# Patient Record
Sex: Female | Born: 1975 | Race: Asian | Hispanic: No | Marital: Married | Smoking: Never smoker
Health system: Southern US, Community
[De-identification: ages and names within clinical notes are randomized; demographics above are authoritative.]

## PROBLEM LIST (undated history)

## (undated) DIAGNOSIS — Z9981 Dependence on supplemental oxygen: Secondary | ICD-10-CM

## (undated) DIAGNOSIS — E119 Type 2 diabetes mellitus without complications: Secondary | ICD-10-CM

## (undated) DIAGNOSIS — J9600 Acute respiratory failure, unspecified whether with hypoxia or hypercapnia: Secondary | ICD-10-CM

## (undated) DIAGNOSIS — Z9483 Pancreas transplant status: Secondary | ICD-10-CM

## (undated) DIAGNOSIS — J189 Pneumonia, unspecified organism: Secondary | ICD-10-CM

## (undated) DIAGNOSIS — C569 Malignant neoplasm of unspecified ovary: Secondary | ICD-10-CM

## (undated) DIAGNOSIS — E11319 Type 2 diabetes mellitus with unspecified diabetic retinopathy without macular edema: Secondary | ICD-10-CM

## (undated) DIAGNOSIS — A150 Tuberculosis of lung: Secondary | ICD-10-CM

## (undated) DIAGNOSIS — Z9289 Personal history of other medical treatment: Secondary | ICD-10-CM

## (undated) DIAGNOSIS — Z992 Dependence on renal dialysis: Secondary | ICD-10-CM

## (undated) DIAGNOSIS — D649 Anemia, unspecified: Secondary | ICD-10-CM

## (undated) DIAGNOSIS — N186 End stage renal disease: Secondary | ICD-10-CM

## (undated) DIAGNOSIS — Z940 Kidney transplant status: Secondary | ICD-10-CM

## (undated) DIAGNOSIS — I10 Essential (primary) hypertension: Secondary | ICD-10-CM

## (undated) HISTORY — PX: EYE SURGERY: SHX253

## (undated) HISTORY — PX: RENAL BIOPSY: SHX156

---

## 2000-12-27 ENCOUNTER — Emergency Department (HOSPITAL_COMMUNITY): Admission: EM | Admit: 2000-12-27 | Discharge: 2000-12-27 | Payer: Self-pay | Admitting: Emergency Medicine

## 2003-10-08 DIAGNOSIS — C569 Malignant neoplasm of unspecified ovary: Secondary | ICD-10-CM

## 2003-10-08 HISTORY — DX: Malignant neoplasm of unspecified ovary: C56.9

## 2003-10-08 HISTORY — PX: TUMOR REMOVAL: SHX12

## 2004-07-26 ENCOUNTER — Emergency Department (HOSPITAL_COMMUNITY): Admission: EM | Admit: 2004-07-26 | Discharge: 2004-07-27 | Payer: Self-pay | Admitting: Emergency Medicine

## 2005-05-30 ENCOUNTER — Inpatient Hospital Stay (HOSPITAL_COMMUNITY): Admission: AD | Admit: 2005-05-30 | Discharge: 2005-05-30 | Payer: Self-pay | Admitting: Family Medicine

## 2005-11-01 ENCOUNTER — Ambulatory Visit: Payer: Self-pay | Admitting: *Deleted

## 2005-11-01 ENCOUNTER — Ambulatory Visit (HOSPITAL_COMMUNITY): Admission: RE | Admit: 2005-11-01 | Discharge: 2005-11-01 | Payer: Self-pay | Admitting: *Deleted

## 2005-11-06 ENCOUNTER — Ambulatory Visit: Payer: Self-pay | Admitting: *Deleted

## 2005-11-13 ENCOUNTER — Ambulatory Visit (HOSPITAL_COMMUNITY): Admission: RE | Admit: 2005-11-13 | Discharge: 2005-11-13 | Payer: Self-pay | Admitting: Obstetrics and Gynecology

## 2005-11-13 ENCOUNTER — Ambulatory Visit: Payer: Self-pay | Admitting: *Deleted

## 2005-11-21 ENCOUNTER — Ambulatory Visit: Payer: Self-pay | Admitting: Family Medicine

## 2005-11-27 ENCOUNTER — Ambulatory Visit: Payer: Self-pay | Admitting: Obstetrics & Gynecology

## 2005-11-27 ENCOUNTER — Ambulatory Visit: Admission: RE | Admit: 2005-11-27 | Discharge: 2005-11-27 | Payer: Self-pay | Admitting: Gynecologic Oncology

## 2005-12-04 ENCOUNTER — Ambulatory Visit: Payer: Self-pay | Admitting: *Deleted

## 2005-12-16 ENCOUNTER — Ambulatory Visit: Payer: Self-pay | Admitting: Family Medicine

## 2005-12-30 ENCOUNTER — Ambulatory Visit: Payer: Self-pay | Admitting: *Deleted

## 2006-01-06 ENCOUNTER — Ambulatory Visit: Payer: Self-pay | Admitting: Obstetrics & Gynecology

## 2006-01-14 ENCOUNTER — Ambulatory Visit: Payer: Self-pay | Admitting: *Deleted

## 2006-01-14 ENCOUNTER — Ambulatory Visit (HOSPITAL_COMMUNITY): Admission: RE | Admit: 2006-01-14 | Discharge: 2006-01-14 | Payer: Self-pay | Admitting: *Deleted

## 2006-01-20 ENCOUNTER — Ambulatory Visit: Payer: Self-pay | Admitting: Family Medicine

## 2006-01-21 ENCOUNTER — Ambulatory Visit (HOSPITAL_COMMUNITY): Admission: RE | Admit: 2006-01-21 | Discharge: 2006-01-21 | Payer: Self-pay | Admitting: *Deleted

## 2006-01-21 ENCOUNTER — Ambulatory Visit: Payer: Self-pay | Admitting: *Deleted

## 2006-01-28 ENCOUNTER — Ambulatory Visit (HOSPITAL_COMMUNITY): Admission: RE | Admit: 2006-01-28 | Discharge: 2006-01-28 | Payer: Self-pay | Admitting: *Deleted

## 2006-02-03 ENCOUNTER — Ambulatory Visit: Payer: Self-pay | Admitting: Family Medicine

## 2006-02-17 ENCOUNTER — Ambulatory Visit: Payer: Self-pay | Admitting: Family Medicine

## 2006-03-10 ENCOUNTER — Ambulatory Visit (HOSPITAL_COMMUNITY): Admission: RE | Admit: 2006-03-10 | Discharge: 2006-03-10 | Payer: Self-pay | Admitting: Obstetrics & Gynecology

## 2006-03-10 ENCOUNTER — Ambulatory Visit: Payer: Self-pay | Admitting: Obstetrics & Gynecology

## 2006-03-24 ENCOUNTER — Ambulatory Visit (HOSPITAL_COMMUNITY): Admission: RE | Admit: 2006-03-24 | Discharge: 2006-03-24 | Payer: Self-pay | Admitting: Obstetrics & Gynecology

## 2006-03-24 ENCOUNTER — Ambulatory Visit: Payer: Self-pay | Admitting: Obstetrics & Gynecology

## 2006-04-07 ENCOUNTER — Ambulatory Visit: Payer: Self-pay | Admitting: Gynecology

## 2006-04-08 ENCOUNTER — Ambulatory Visit: Payer: Self-pay | Admitting: Obstetrics and Gynecology

## 2006-04-08 ENCOUNTER — Inpatient Hospital Stay (HOSPITAL_COMMUNITY): Admission: AD | Admit: 2006-04-08 | Discharge: 2006-04-08 | Payer: Self-pay | Admitting: Obstetrics and Gynecology

## 2006-04-10 ENCOUNTER — Inpatient Hospital Stay (HOSPITAL_COMMUNITY): Admission: AD | Admit: 2006-04-10 | Discharge: 2006-04-22 | Payer: Self-pay | Admitting: Obstetrics & Gynecology

## 2006-04-10 ENCOUNTER — Ambulatory Visit: Payer: Self-pay | Admitting: Critical Care Medicine

## 2006-04-10 ENCOUNTER — Ambulatory Visit: Payer: Self-pay | Admitting: Family Medicine

## 2006-04-10 ENCOUNTER — Ambulatory Visit: Payer: Self-pay | Admitting: *Deleted

## 2006-04-11 ENCOUNTER — Ambulatory Visit: Payer: Self-pay | Admitting: Neonatology

## 2006-04-15 ENCOUNTER — Ambulatory Visit: Payer: Self-pay | Admitting: Infectious Diseases

## 2006-04-19 ENCOUNTER — Encounter: Payer: Self-pay | Admitting: Internal Medicine

## 2006-04-22 ENCOUNTER — Encounter: Payer: Self-pay | Admitting: Internal Medicine

## 2006-04-24 ENCOUNTER — Ambulatory Visit: Payer: Self-pay | Admitting: Family Medicine

## 2006-04-28 ENCOUNTER — Ambulatory Visit: Payer: Self-pay | Admitting: *Deleted

## 2006-04-28 ENCOUNTER — Ambulatory Visit: Payer: Self-pay | Admitting: Gynecology

## 2006-04-28 ENCOUNTER — Inpatient Hospital Stay (HOSPITAL_COMMUNITY): Admission: AD | Admit: 2006-04-28 | Discharge: 2006-05-25 | Payer: Self-pay | Admitting: Gynecology

## 2006-05-15 ENCOUNTER — Ambulatory Visit: Payer: Self-pay | Admitting: Infectious Diseases

## 2006-05-21 ENCOUNTER — Encounter (INDEPENDENT_AMBULATORY_CARE_PROVIDER_SITE_OTHER): Payer: Self-pay | Admitting: Specialist

## 2006-05-29 ENCOUNTER — Encounter: Payer: Self-pay | Admitting: Internal Medicine

## 2006-07-02 ENCOUNTER — Encounter: Payer: Self-pay | Admitting: Internal Medicine

## 2007-08-11 ENCOUNTER — Encounter (HOSPITAL_COMMUNITY): Admission: RE | Admit: 2007-08-11 | Discharge: 2007-11-09 | Payer: Self-pay | Admitting: Nephrology

## 2007-09-10 ENCOUNTER — Encounter: Admission: RE | Admit: 2007-09-10 | Discharge: 2007-12-09 | Payer: Self-pay | Admitting: Nephrology

## 2007-09-11 ENCOUNTER — Encounter: Admission: RE | Admit: 2007-09-11 | Discharge: 2007-09-11 | Payer: Self-pay | Admitting: Internal Medicine

## 2007-11-18 ENCOUNTER — Encounter (HOSPITAL_COMMUNITY): Admission: RE | Admit: 2007-11-18 | Discharge: 2008-02-16 | Payer: Self-pay | Admitting: Nephrology

## 2008-03-01 ENCOUNTER — Encounter (HOSPITAL_COMMUNITY): Admission: RE | Admit: 2008-03-01 | Discharge: 2008-05-16 | Payer: Self-pay | Admitting: Nephrology

## 2008-03-14 ENCOUNTER — Ambulatory Visit: Payer: Self-pay | Admitting: Cardiovascular Disease

## 2008-03-15 ENCOUNTER — Ambulatory Visit: Payer: Self-pay

## 2008-03-30 ENCOUNTER — Ambulatory Visit (HOSPITAL_COMMUNITY): Admission: RE | Admit: 2008-03-30 | Discharge: 2008-03-30 | Payer: Self-pay | Admitting: Surgery

## 2008-07-19 ENCOUNTER — Emergency Department (HOSPITAL_COMMUNITY): Admission: EM | Admit: 2008-07-19 | Discharge: 2008-07-19 | Payer: Self-pay | Admitting: Emergency Medicine

## 2008-07-21 ENCOUNTER — Encounter: Admission: RE | Admit: 2008-07-21 | Discharge: 2008-07-21 | Payer: Self-pay | Admitting: General Surgery

## 2008-07-26 ENCOUNTER — Ambulatory Visit (HOSPITAL_COMMUNITY): Admission: RE | Admit: 2008-07-26 | Discharge: 2008-07-26 | Payer: Self-pay | Admitting: Vascular Surgery

## 2008-07-26 ENCOUNTER — Ambulatory Visit: Payer: Self-pay | Admitting: Vascular Surgery

## 2008-07-26 ENCOUNTER — Emergency Department (HOSPITAL_COMMUNITY): Admission: EM | Admit: 2008-07-26 | Discharge: 2008-07-27 | Payer: Self-pay | Admitting: Emergency Medicine

## 2008-07-29 ENCOUNTER — Ambulatory Visit (HOSPITAL_COMMUNITY): Admission: RE | Admit: 2008-07-29 | Discharge: 2008-07-29 | Payer: Self-pay | Admitting: Surgery

## 2008-08-01 ENCOUNTER — Ambulatory Visit (HOSPITAL_COMMUNITY): Admission: RE | Admit: 2008-08-01 | Discharge: 2008-08-01 | Payer: Self-pay | Admitting: Nephrology

## 2008-08-05 ENCOUNTER — Ambulatory Visit (HOSPITAL_COMMUNITY): Admission: RE | Admit: 2008-08-05 | Discharge: 2008-08-05 | Payer: Self-pay | Admitting: Surgery

## 2008-08-10 ENCOUNTER — Encounter: Admission: RE | Admit: 2008-08-10 | Discharge: 2008-08-10 | Payer: Self-pay | Admitting: Nephrology

## 2008-08-12 ENCOUNTER — Telehealth (INDEPENDENT_AMBULATORY_CARE_PROVIDER_SITE_OTHER): Payer: Self-pay | Admitting: *Deleted

## 2008-09-12 ENCOUNTER — Ambulatory Visit: Payer: Self-pay | Admitting: Surgery

## 2008-09-22 ENCOUNTER — Ambulatory Visit: Payer: Self-pay | Admitting: Vascular Surgery

## 2008-09-22 ENCOUNTER — Ambulatory Visit (HOSPITAL_COMMUNITY): Admission: RE | Admit: 2008-09-22 | Discharge: 2008-09-22 | Payer: Self-pay | Admitting: Surgery

## 2008-10-07 DIAGNOSIS — J189 Pneumonia, unspecified organism: Secondary | ICD-10-CM

## 2008-10-07 HISTORY — DX: Pneumonia, unspecified organism: J18.9

## 2008-10-07 HISTORY — PX: COMBINED KIDNEY-PANCREAS TRANSPLANT: SHX1382

## 2008-10-10 ENCOUNTER — Ambulatory Visit: Payer: Self-pay | Admitting: Internal Medicine

## 2008-10-10 DIAGNOSIS — R0602 Shortness of breath: Secondary | ICD-10-CM

## 2008-10-10 DIAGNOSIS — E785 Hyperlipidemia, unspecified: Secondary | ICD-10-CM

## 2008-10-10 DIAGNOSIS — I10 Essential (primary) hypertension: Secondary | ICD-10-CM | POA: Insufficient documentation

## 2008-10-10 DIAGNOSIS — Z8611 Personal history of tuberculosis: Secondary | ICD-10-CM

## 2008-10-11 ENCOUNTER — Encounter: Admission: RE | Admit: 2008-10-11 | Discharge: 2008-10-11 | Payer: Self-pay | Admitting: Internal Medicine

## 2008-10-17 ENCOUNTER — Other Ambulatory Visit: Admission: RE | Admit: 2008-10-17 | Discharge: 2008-10-17 | Payer: Self-pay | Admitting: Gynecology

## 2008-10-17 ENCOUNTER — Encounter: Payer: Self-pay | Admitting: Gynecology

## 2008-10-17 ENCOUNTER — Ambulatory Visit: Payer: Self-pay | Admitting: Gynecology

## 2008-10-20 ENCOUNTER — Ambulatory Visit: Payer: Self-pay | Admitting: Cardiovascular Disease

## 2008-10-20 ENCOUNTER — Encounter (INDEPENDENT_AMBULATORY_CARE_PROVIDER_SITE_OTHER): Payer: Self-pay | Admitting: Nephrology

## 2008-10-20 ENCOUNTER — Ambulatory Visit: Payer: Self-pay

## 2008-10-20 ENCOUNTER — Encounter: Payer: Self-pay | Admitting: Cardiovascular Disease

## 2008-10-20 DIAGNOSIS — Z992 Dependence on renal dialysis: Secondary | ICD-10-CM

## 2008-10-20 DIAGNOSIS — N186 End stage renal disease: Secondary | ICD-10-CM | POA: Insufficient documentation

## 2008-10-20 DIAGNOSIS — R079 Chest pain, unspecified: Secondary | ICD-10-CM

## 2008-10-22 ENCOUNTER — Inpatient Hospital Stay (HOSPITAL_COMMUNITY): Admission: EM | Admit: 2008-10-22 | Discharge: 2008-10-24 | Payer: Self-pay | Admitting: Emergency Medicine

## 2008-10-23 ENCOUNTER — Ambulatory Visit: Payer: Self-pay | Admitting: Infectious Diseases

## 2008-11-08 ENCOUNTER — Ambulatory Visit: Payer: Self-pay | Admitting: Vascular Surgery

## 2008-11-14 ENCOUNTER — Ambulatory Visit: Payer: Self-pay | Admitting: Internal Medicine

## 2008-12-17 ENCOUNTER — Other Ambulatory Visit: Payer: Self-pay | Admitting: Emergency Medicine

## 2008-12-18 ENCOUNTER — Inpatient Hospital Stay (HOSPITAL_COMMUNITY): Admission: EM | Admit: 2008-12-18 | Discharge: 2008-12-21 | Payer: Self-pay | Admitting: Nephrology

## 2009-01-04 ENCOUNTER — Ambulatory Visit (HOSPITAL_COMMUNITY): Admission: RE | Admit: 2009-01-04 | Discharge: 2009-01-04 | Payer: Self-pay | Admitting: Nephrology

## 2009-01-14 ENCOUNTER — Emergency Department (HOSPITAL_COMMUNITY): Admission: EM | Admit: 2009-01-14 | Discharge: 2009-01-14 | Payer: Self-pay | Admitting: Emergency Medicine

## 2009-01-19 ENCOUNTER — Ambulatory Visit (HOSPITAL_COMMUNITY): Admission: RE | Admit: 2009-01-19 | Discharge: 2009-01-19 | Payer: Self-pay | Admitting: Nephrology

## 2009-02-01 ENCOUNTER — Ambulatory Visit: Payer: Self-pay | Admitting: Vascular Surgery

## 2009-02-07 ENCOUNTER — Ambulatory Visit (HOSPITAL_COMMUNITY): Admission: RE | Admit: 2009-02-07 | Discharge: 2009-02-07 | Payer: Self-pay | Admitting: Vascular Surgery

## 2009-02-08 ENCOUNTER — Ambulatory Visit: Payer: Self-pay | Admitting: Vascular Surgery

## 2009-02-16 ENCOUNTER — Ambulatory Visit (HOSPITAL_COMMUNITY): Admission: RE | Admit: 2009-02-16 | Discharge: 2009-02-16 | Payer: Self-pay | Admitting: Vascular Surgery

## 2009-02-16 ENCOUNTER — Ambulatory Visit: Payer: Self-pay | Admitting: Vascular Surgery

## 2009-03-28 ENCOUNTER — Ambulatory Visit (HOSPITAL_COMMUNITY): Admission: RE | Admit: 2009-03-28 | Discharge: 2009-03-28 | Payer: Self-pay | Admitting: Vascular Surgery

## 2009-03-28 ENCOUNTER — Ambulatory Visit: Payer: Self-pay | Admitting: Surgery

## 2009-07-27 ENCOUNTER — Ambulatory Visit: Payer: Self-pay | Admitting: Women's Health

## 2010-10-28 ENCOUNTER — Encounter: Payer: Self-pay | Admitting: Internal Medicine

## 2010-10-28 ENCOUNTER — Encounter: Payer: Self-pay | Admitting: *Deleted

## 2010-12-13 HISTORY — PX: PANCREATECTOMY: SHX1019

## 2011-01-15 LAB — POCT I-STAT 4, (NA,K, GLUC, HGB,HCT)
Glucose, Bld: 140 mg/dL — ABNORMAL HIGH (ref 70–99)
Potassium: 5.4 mEq/L — ABNORMAL HIGH (ref 3.5–5.1)

## 2011-01-16 LAB — CBC
HCT: 31.6 % — ABNORMAL LOW (ref 36.0–46.0)
MCHC: 32.7 g/dL (ref 30.0–36.0)
MCV: 83.2 fL (ref 78.0–100.0)
RBC: 3.8 MIL/uL — ABNORMAL LOW (ref 3.87–5.11)

## 2011-01-16 LAB — BASIC METABOLIC PANEL
BUN: 38 mg/dL — ABNORMAL HIGH (ref 6–23)
CO2: 27 mEq/L (ref 19–32)
Chloride: 92 mEq/L — ABNORMAL LOW (ref 96–112)
Creatinine, Ser: 8.05 mg/dL — ABNORMAL HIGH (ref 0.4–1.2)
Potassium: 5.6 mEq/L — ABNORMAL HIGH (ref 3.5–5.1)

## 2011-01-16 LAB — DIFFERENTIAL
Basophils Relative: 1 % (ref 0–1)
Eosinophils Absolute: 0.2 10*3/uL (ref 0.0–0.7)
Eosinophils Relative: 4 % (ref 0–5)
Monocytes Relative: 8 % (ref 3–12)
Neutrophils Relative %: 55 % (ref 43–77)

## 2011-01-17 LAB — BASIC METABOLIC PANEL
BUN: 47 mg/dL — ABNORMAL HIGH (ref 6–23)
CO2: 20 mEq/L (ref 19–32)
Calcium: 7.7 mg/dL — ABNORMAL LOW (ref 8.4–10.5)
Calcium: 9.1 mg/dL (ref 8.4–10.5)
Chloride: 96 mEq/L (ref 96–112)
Creatinine, Ser: 9.4 mg/dL — ABNORMAL HIGH (ref 0.4–1.2)
GFR calc Af Amer: 5 mL/min — ABNORMAL LOW (ref >60)
GFR calc Af Amer: 7 mL/min — ABNORMAL LOW (ref >60)
GFR calc Af Amer: 6 mL/min — ABNORMAL LOW (ref >60)
GFR calc non Af Amer: 6 mL/min — ABNORMAL LOW (ref >60)
GFR calc non Af Amer: 5 mL/min — ABNORMAL LOW (ref >60)
Glucose, Bld: 114 mg/dL — ABNORMAL HIGH (ref 70–99)
Potassium: 4.4 mEq/L (ref 3.5–5.1)
Potassium: 6.8 mEq/L (ref 3.5–5.1)
Sodium: 133 mEq/L — ABNORMAL LOW (ref 135–145)
Sodium: 135 mEq/L (ref 135–145)

## 2011-01-17 LAB — CBC
HCT: 35.2 % — ABNORMAL LOW (ref 36.0–46.0)
HCT: 42.5 % (ref 36.0–46.0)
Hemoglobin: 11.5 g/dL — ABNORMAL LOW (ref 12.0–15.0)
MCV: 77.4 fL — ABNORMAL LOW (ref 78.0–100.0)
MCV: 79.8 fL (ref 78.0–100.0)
Platelets: 169 10*3/uL (ref 150–400)
Platelets: 238 10*3/uL (ref 150–400)
RBC: 3.58 MIL/uL — ABNORMAL LOW (ref 3.87–5.11)
RBC: 4.42 MIL/uL (ref 3.87–5.11)
RBC: 5.32 MIL/uL — ABNORMAL HIGH (ref 3.87–5.11)
WBC: 6.4 10*3/uL (ref 4.0–10.5)
WBC: 13.1 10*3/uL — ABNORMAL HIGH (ref 4.0–10.5)
WBC: 18.9 10*3/uL — ABNORMAL HIGH (ref 4.0–10.5)

## 2011-01-17 LAB — GLUCOSE, CAPILLARY
Glucose-Capillary: 124 mg/dL — ABNORMAL HIGH (ref 70–99)
Glucose-Capillary: 135 mg/dL — ABNORMAL HIGH (ref 70–99)
Glucose-Capillary: 152 mg/dL — ABNORMAL HIGH (ref 70–99)
Glucose-Capillary: 223 mg/dL — ABNORMAL HIGH (ref 70–99)
Glucose-Capillary: 148 mg/dL — ABNORMAL HIGH (ref 70–99)
Glucose-Capillary: 178 mg/dL — ABNORMAL HIGH (ref 70–99)
Glucose-Capillary: 205 mg/dL — ABNORMAL HIGH (ref 70–99)
Glucose-Capillary: 232 mg/dL — ABNORMAL HIGH (ref 70–99)

## 2011-01-17 LAB — CULTURE, BLOOD (ROUTINE X 2): Culture: NO GROWTH

## 2011-01-17 LAB — DIFFERENTIAL
Lymphocytes Relative: 2 % — ABNORMAL LOW (ref 12–46)
Lymphs Abs: 0.4 10*3/uL — ABNORMAL LOW (ref 0.7–4.0)
Monocytes Relative: 4 % (ref 3–12)
Neutrophils Relative %: 93 % — ABNORMAL HIGH (ref 43–77)

## 2011-01-17 LAB — POTASSIUM
Potassium: 6.7 mEq/L (ref 3.5–5.1)
Potassium: 6.6 mEq/L (ref 3.5–5.1)

## 2011-01-17 LAB — RENAL FUNCTION PANEL
BUN: 60 mg/dL — ABNORMAL HIGH (ref 6–23)
Calcium: 8.6 mg/dL (ref 8.4–10.5)
Creatinine, Ser: 9.5 mg/dL — ABNORMAL HIGH (ref 0.4–1.2)
GFR calc Af Amer: 6 mL/min — ABNORMAL LOW (ref >60)
Glucose, Bld: 146 mg/dL — ABNORMAL HIGH (ref 70–99)
Phosphorus: 9.1 mg/dL — ABNORMAL HIGH (ref 2.3–4.6)
Sodium: 133 mEq/L — ABNORMAL LOW (ref 135–145)

## 2011-01-21 LAB — CBC
HCT: 34.7 % — ABNORMAL LOW (ref 36.0–46.0)
Hemoglobin: 11.1 g/dL — ABNORMAL LOW (ref 12.0–15.0)
Hemoglobin: 9.9 g/dL — ABNORMAL LOW (ref 12.0–15.0)
MCHC: 32 g/dL (ref 30.0–36.0)
MCHC: 33.6 g/dL (ref 30.0–36.0)
MCV: 87.4 fL (ref 78.0–100.0)
MCV: 87.7 fL (ref 78.0–100.0)
Platelets: 197 10*3/uL (ref 150–400)
Platelets: 256 10*3/uL (ref 150–400)
RBC: 3.97 MIL/uL (ref 3.87–5.11)
RBC: 4.36 MIL/uL (ref 3.87–5.11)
RDW: 20.6 % — ABNORMAL HIGH (ref 11.5–15.5)
RDW: 20.7 % — ABNORMAL HIGH (ref 11.5–15.5)
WBC: 19.4 10*3/uL — ABNORMAL HIGH (ref 4.0–10.5)
WBC: 27.5 10*3/uL — ABNORMAL HIGH (ref 4.0–10.5)

## 2011-01-21 LAB — DIFFERENTIAL
Basophils Absolute: 0 10*3/uL (ref 0.0–0.1)
Basophils Absolute: 0 10*3/uL (ref 0.0–0.1)
Basophils Relative: 0 % (ref 0–1)
Basophils Relative: 0 % (ref 0–1)
Eosinophils Absolute: 0 10*3/uL (ref 0.0–0.7)
Eosinophils Relative: 0 % (ref 0–5)
Lymphocytes Relative: 10 % — ABNORMAL LOW (ref 12–46)
Lymphocytes Relative: 3 % — ABNORMAL LOW (ref 12–46)
Lymphs Abs: 0.8 10*3/uL (ref 0.7–4.0)
Monocytes Absolute: 0.6 10*3/uL (ref 0.1–1.0)
Monocytes Absolute: 1.1 10*3/uL — ABNORMAL HIGH (ref 0.1–1.0)
Monocytes Relative: 4 % (ref 3–12)
Neutro Abs: 25.6 10*3/uL — ABNORMAL HIGH (ref 1.7–7.7)
Neutro Abs: 8.4 10*3/uL — ABNORMAL HIGH (ref 1.7–7.7)
Neutrophils Relative %: 83 % — ABNORMAL HIGH (ref 43–77)
Neutrophils Relative %: 93 % — ABNORMAL HIGH (ref 43–77)
WBC Morphology: INCREASED

## 2011-01-21 LAB — RENAL FUNCTION PANEL
CO2: 28 mEq/L (ref 19–32)
Chloride: 95 mEq/L — ABNORMAL LOW (ref 96–112)
GFR calc Af Amer: 11 mL/min — ABNORMAL LOW (ref >60)
GFR calc non Af Amer: 9 mL/min — ABNORMAL LOW (ref >60)
Potassium: 5.2 mEq/L — ABNORMAL HIGH (ref 3.5–5.1)
Sodium: 133 mEq/L — ABNORMAL LOW (ref 135–145)

## 2011-01-21 LAB — COMPREHENSIVE METABOLIC PANEL
ALT: 12 U/L (ref 0–35)
ALT: 19 U/L (ref 0–35)
AST: 18 U/L (ref 0–37)
AST: 21 U/L (ref 0–37)
Albumin: 3 g/dL — ABNORMAL LOW (ref 3.5–5.2)
Alkaline Phosphatase: 47 U/L (ref 39–117)
BUN: 28 mg/dL — ABNORMAL HIGH (ref 6–23)
CO2: 20 mEq/L (ref 19–32)
CO2: 27 mEq/L (ref 19–32)
Calcium: 7.8 mg/dL — ABNORMAL LOW (ref 8.4–10.5)
Calcium: 7.9 mg/dL — ABNORMAL LOW (ref 8.4–10.5)
Chloride: 90 mEq/L — ABNORMAL LOW (ref 96–112)
Chloride: 95 mEq/L — ABNORMAL LOW (ref 96–112)
Creatinine, Ser: 8.39 mg/dL — ABNORMAL HIGH (ref 0.4–1.2)
GFR calc Af Amer: 7 mL/min — ABNORMAL LOW (ref >60)
GFR calc non Af Amer: 5 mL/min — ABNORMAL LOW (ref >60)
GFR calc non Af Amer: 6 mL/min — ABNORMAL LOW (ref >60)
Glucose, Bld: 145 mg/dL — ABNORMAL HIGH (ref 70–99)
Glucose, Bld: 204 mg/dL — ABNORMAL HIGH (ref 70–99)
Potassium: 5.2 mEq/L — ABNORMAL HIGH (ref 3.5–5.1)
Sodium: 127 mEq/L — ABNORMAL LOW (ref 135–145)
Sodium: 127 mEq/L — ABNORMAL LOW (ref 135–145)
Total Bilirubin: 0.8 mg/dL (ref 0.3–1.2)
Total Bilirubin: 1 mg/dL (ref 0.3–1.2)
Total Protein: 6.2 g/dL (ref 6.0–8.3)

## 2011-01-21 LAB — GLUCOSE, CAPILLARY
Glucose-Capillary: 131 mg/dL — ABNORMAL HIGH (ref 70–99)
Glucose-Capillary: 131 mg/dL — ABNORMAL HIGH (ref 70–99)
Glucose-Capillary: 141 mg/dL — ABNORMAL HIGH (ref 70–99)
Glucose-Capillary: 145 mg/dL — ABNORMAL HIGH (ref 70–99)
Glucose-Capillary: 163 mg/dL — ABNORMAL HIGH (ref 70–99)
Glucose-Capillary: 164 mg/dL — ABNORMAL HIGH (ref 70–99)
Glucose-Capillary: 168 mg/dL — ABNORMAL HIGH (ref 70–99)
Glucose-Capillary: 208 mg/dL — ABNORMAL HIGH (ref 70–99)

## 2011-01-21 LAB — AFB CULTURE WITH SMEAR (NOT AT ARMC): Acid Fast Smear: 3

## 2011-01-21 LAB — CULTURE, BLOOD (ROUTINE X 2)

## 2011-01-21 LAB — POCT I-STAT 3, ART BLOOD GAS (G3+)
O2 Saturation: 94 %
Patient temperature: 100.2
TCO2: 25 mmol/L (ref 0–100)
pO2, Arterial: 70 mmHg — ABNORMAL LOW (ref 80.0–100.0)

## 2011-01-21 LAB — EXPECTORATED SPUTUM ASSESSMENT W GRAM STAIN, RFLX TO RESP C

## 2011-01-21 LAB — PHOSPHORUS: Phosphorus: 4.9 mg/dL — ABNORMAL HIGH (ref 2.3–4.6)

## 2011-01-21 LAB — IRON AND TIBC: TIBC: 249 ug/dL — ABNORMAL LOW (ref 250–470)

## 2011-02-19 ENCOUNTER — Other Ambulatory Visit: Payer: Self-pay | Admitting: Nephrology

## 2011-02-19 ENCOUNTER — Encounter (HOSPITAL_COMMUNITY): Payer: BC Managed Care – PPO | Attending: Nephrology

## 2011-02-19 DIAGNOSIS — N183 Chronic kidney disease, stage 3 unspecified: Secondary | ICD-10-CM | POA: Insufficient documentation

## 2011-02-19 DIAGNOSIS — D638 Anemia in other chronic diseases classified elsewhere: Secondary | ICD-10-CM | POA: Insufficient documentation

## 2011-02-19 NOTE — H&P (Signed)
[name], [name] NO.:  [id number]   MEDICAL RECORD NO.:  [id number]          PATIENT TYPE:  INP   LOCATION:  6740                         FACILITY:  [facility]   PHYSICIAN:  Maree Krabbe, M.D.DATE OF BIRTH:  September 10, 1976   DATE OF ADMISSION:  12/18/2008  DATE OF DISCHARGE:                              HISTORY & PHYSICAL   REASON FOR ADMISSION:  Fever.   HISTORY:  This is a 35 year old Asian female with end-stage renal  disease due to diabetes.  She has had a 12-year history of diabetes.  She was initially treated with peritoneal dialysis which failed after 2  months, I believe due to infection according to the patient's history.  She was changed to hemodialysis 7 months ago via a PermCath, and she has  a left forearm AV fistula which is not mature yet.  The patient says the  fistula needs another month or so to be used.   The patient presented to [facility] last night with high  fevers and chills of 24 hours duration.  She had no other focal symptoms  and specifically denied any cough, chest pain, headache, abdominal pain.  She says that she does not void anymore.  She did have some nausea with  vomiting in the emergency room.  Blood cultures were done, and the  patient was given vancomycin and Fortaz empirically.  Potassium returned  to high at 6.8, and the patient had a white blood count of 18,000, so  she was transferred for admission to [facility].  Temperature  which was 100 on presentation increased up to 101.5.   PAST MEDICAL HISTORY:  ESRD, diabetes, hypertension on 3 medications,  history of Tenckhoff catheter placement and removal, right IJ Diatek,  left forearm AV fistula.   MEDICATIONS:  1. Micardis 80 daily.  2. Aspirin 81 mg daily.  3. Clonidine 0.2 b.i.d.  4. Simvastatin 20 daily.  5. Amlodipine 10 mg at bedtime.  6. Lexapro 10 mg daily.  7. Levemir FlexPen.  8. Rena-Vite.   No allergies.   SOCIAL HISTORY:   Marries.  She has children.  She is not working.  No  alcohol, tobacco, or drug use.   REVIEW OF SYSTEMS:  Denies weight loss, hearing loss, visual change,  headache.  Denies productive cough, sore throat, difficulty swallowing,  chest pain, abdominal pain, diarrhea.  She does not void.  She denies  any joint pain or swelling.  Denies any skin rash or ulceration.  No  focal neurologic complaints.   PHYSICAL EXAMINATION:  VITAL SIGNS:  Blood pressure is 139/78, pulse 96,  temp 102.  GENERAL:  The patient is alert, looks uncomfortable but not in any  distress.  SKIN:  Warm and dry.  HEENT:  PERRLA.  EOMI.  Sclerae anicteric.  Throat is clear.  NECK:  Supple with flat neck veins.  No meningismus.  CHEST:  Clear throughout.  CARDIAC:  Regular rate and rhythm without murmur, rub, or gallop.  She  is tachycardic.  ABDOMEN:  Soft, flat, nontender, with active bowel sounds.  EXTREMITIES:  Joints show no signs of effusion or tenderness.  The lower  extremities show no edema.  Good pulses in the feet.  No skin rash or  ulceration.  NEUROLOGIC:  Alert and oriented x3, nonfocal motor exam.   LABORATORY DATA:  Sodium 136.  Potassium  at the outside facility was  6.8.  She was treated there acutely with Kayexalate, IV insulin,  glucose, and calcium.  Repeat potassium here is 5.8, BUN 47, creatinine  9.4.  Hemoglobin 13, white blood count 18,000.  I cannot find chest x-  ray result.   IMPRESSION:  1. Hyperkalemia.  2. Fever and leukocytosis.  Suspect catheter-related sepsis.  No focal      findings on exam.  Chest x-ray needs to be done.  The patient does      not void, so will not attempt to collect a urine specimen at this      time.  3. End-stage renal disease, dialyzes Monday, Wednesday, Friday.      History of failed peritoneal dialysis.  4. History of diabetes on insulin.  5. Left forearm arteriovenous fistula which is immature.  6. Hypertension on 3 blood pressure medications.    PLAN:  1. Admit.  2. Acute dialysis for hyperkalemia.  3. Blood cultures and IV antibiotics.  4. Dialysis Monday, Wednesday, Friday.  5. We will keep catheter in for 24-48 hours to see what kind of      clinical response he has.  May need to be removed if the patient's      symptoms are worsening or not getting better over time.       Maree Krabbe, M.D.  Electronically Signed     RDS/MEDQ  D:  12/18/2008  T:  12/18/2008  Job:  [id number]

## 2011-02-19 NOTE — Discharge Summary (Signed)
[name], [name]                   ACCOUNT NO.:  [id number]   MEDICAL RECORD NO.:  [id number]          PATIENT TYPE:  INP   LOCATION:  6740                         FACILITY:  [facility]   PHYSICIAN:  Dr. Darrick Penna          DATE OF BIRTH:  January 28, 1976   DATE OF ADMISSION:  12/18/2008  DATE OF DISCHARGE:  12/21/2008                               DISCHARGE SUMMARY   ATTENDING PHYSICIAN:  Dr. Darrick Penna.   DISCHARGE DIAGNOSES:  1. Fever and leukocytosis secondary to catheter-related sepsis with      methicillin-sensitive Staphylococcus aureus, resolving.  2. Hyperkalemia, resolved.  3. End-stage renal disease, requiring Monday, Wednesday, and Friday      hemodialysis.  4. Diabetes mellitus.  5. Hypertension.  6. Hyperlipidemia.  7. Gastroesophageal reflux disease.  8. Depression.   DISCHARGE MEDICATIONS:  1. Amlodipine 10 mg p.o. nightly.  2. Micardis 80 mg p.o. nightly.  3. Aspirin 81 mg p.o. daily.  4. Simvastatin 20 mg p.o. daily.  5. Lexapro 10 mg p.o. daily.  6. Rena-Vite 1 tab p.o. daily.  7. Levemir FlexPen 27 units subcu nightly.  8. PhosLo 667 mg p.o. t.i.d.  9. Aciphex 20 mg p.o. daily.  10.Epogen 23,800 units q. hemodialysis 3 times per week.  11.Ancef 1 g q. hemodialysis for 3 weeks post discharge.   DISPOSITION AND FOLLOWUP:  The patient was discharged home in stable  condition.  She was afebrile for greater than 24 hours and her regular  Monday, Wednesday, and Friday hemodialysis schedule will be maintained  upon discharge.  The patient's next dialysis is scheduled for December 23, 2008, at the [facility] location.  She will need to continue on her  Ancef for a total of 3 weeks to complete treatment of her catheter-  related methicillin-sensitive Staph aureus bacteremia.  During her next  hemodialysis session, the patient should also be assessed for further  reduction of her leukocytosis as well as any recurrence of hyperkalemia.   PROCEDURES PERFORMED:  None.   CONSULTATIONS:  None.   BRIEF ADMITTING HISTORY AND PHYSICAL:  Ms. Wissner is a 35 year old female  with end-stage renal disease secondary to diabetes.  She was initially  treated with peritoneal dialysis which failed after 2 months.  She was  changed to hemodialysis 7 months ago with a PermCath and also has a left  forearm AV fistula that was placed approximately 2-3 months ago.  The  patient presented to the [facility] Emergency Room the night prior to  admission with high fevers and chills for 24 hours' duration.  She had  no other focal symptoms and specifically denied any cough, chest pain,  headache, or abdominal pain.  She says that she does not void any more.  She did have some emesis with nausea while in the emergency room.  Blood  cultures were performed and the patient was given vancomycin and Fortaz  empirically.  Potassium levels were found to be elevated at 6.8 and the  patient at that time also had elevated white blood cell count of 18,000.  Therefore, she was transferred to the [facility] for admission  on to the Renal Service.  The patient was also noted while she was in  the [facility] Emergency Department to have a temperature of 101.5  degrees.   Vitals and physical exam on admission are as follows:  VITAL SIGNS:  Blood pressure 139/78, heart rate 96, and temperature  102.0 degrees.  GENERAL:  The patient is alert, appears in mild discomfort but is not in  acute distress.  HEENT:  Pupils are equal, round, and reactive to light, extraocular  motions intact, anicteric sclerae, OP/OC clear.  NECK:  Supple neck with flat veins, no meningeal signs.  CARDIOVASCULAR:  Regular rate and rhythm without murmurs, rubs, or  gallops.  RESPIRATORY:  Clear to auscultation bilaterally.  ABDOMEN:  Positive bowel sounds with a soft, nondistended, and nontender  abdomen.  EXTREMITIES:  No signs of effusion or tenderness in the joints.  There  is no lower extremity edema.  A 2+  dorsalis pedis pulses bilaterally.  A  left forearm AV fistula with thrill was also present.  NEUROLOGIC:  The patient is alert and oriented x3.  Cranial nerves II-  XII are intact and otherwise nonfocal.   LABORATORY DATA ON ADMISSION:  Sodium of 136, potassium of 6.8, chloride  96, bicarb 22, glucose 145, BUN 47, creatinine 9.4, and calcium 9.1.  White blood cell count was 18.9, hemoglobin 13.6, MCV 79.8, and  platelets 238.   HOSPITAL COURSE:  1. Hyperkalemia:  Potassium at an outside facility was noted to be 6.8      and she was treated there acutely with Kayexalate, IV insulin,      glucose, and calcium.  Repeat potassium here was 5.8.  The patient      had acute hemodialysis performed on the night of admission and this      further decreased her potassium levels to within normal limits.      Throughout the rest of her hospital course, the patient had no      further episodes of hyperkalemia.  2. Fever and leukocytosis:  On admission, the patient had a white      blood cell count of 18,000 and had a temperature of 102 degrees.      Blood cultures were immediately obtained which showed grew      methicillin-sensitive Staph aureus, most likely related to her      catheter.  She was empirically started on IV vancomycin and Fortaz      while final blood cultures and sensitivities returned.  However,      once these were back, the patient was transitioned to IV Ancef      therapy for her MSSA sepsis.  Other sources of infection were      evaluated for, but none found.  Throughout her hospital course, the      patient remained with stable vital signs and by the day of      discharge she had been afebrile for greater than 24 hours.  Her      leukocytosis showed a decreasing trend as well.  The patient will      need to continue with her Ancef 1 g q. hemodialysis 3 times a week      for a total of 3 weeks due to the slow resolution of her catheter-      related sepsis.  3. End-stage  renal disease:  The patient was maintained on her Monday,  Wednesday, Friday hemodialysis and this will be continued as      scheduled upon discharge.  4. Diabetes mellitus:  The patient was placed on a carb-modified renal      diet upon admission and started on sliding scale insulin.  Her CBGs      remained fairly well controlled throughout her hospitalization and      upon discharge she was transitioned back to her usual home regimen      of Levemir.  5. Hypertension:  The patient was on 3 different blood pressure      medications prior to admission.  However, her medications were held      throughout her hospital course and restarted slowly once her acute      illness had stabilized.  It was determined that the patient did not      need her clonidine as her blood pressures were well controlled with      use of just her home amlodipine and Micardis with careful attention      to appropriate volume removal during her hemodialysis sessions.      During her followup visit, her blood pressure should be rechecked      and if patient has not been found to be volume overloaded,      consideration can be given to restarting her clonidine at her prior      home dose.  However, it was felt that with appropriate volume      removal during her hemodialysis sessions the patient would not need      to have this restarted.  6. Hyperlipidemia:  The patient was continued on her simvastatin with      no changes made to her dosage.  Consider checking a fasting lipid      profile at her next office visit followup appointment.  7. Access:  The patient had a left forearm AV fistula that was placed      approximately 2-3 months prior to admission.  Upon admission, the      patient stated that her fistula was not mature enough to be used      and was told that it needed another month or so in order to be      fully mature.  However, because of her catheter-related sepsis, her      PermCath had to be  removed and therefore we started using her left      forearm AV fistula for her dialysis.  The patient tolerated this      without any major complications and she will continue using her      fistula from now onwards for future hemodialysis sessions.      Lucy Antigua, MD  Electronically Signed     ______________________________  Dr. Darrick Penna    RK/MEDQ  D:  12/21/2008  T:  12/21/2008  Job:  [id number]   cc:   [facility]

## 2011-02-19 NOTE — Assessment & Plan Note (Signed)
OFFICE VISIT   [name], [name]  DOB:  September 12, 1976                                       02/08/2009  HYQMV#:[id number]   The patient returns for followup today.  She had a fistulogram done this  past week for a nonmaturing left radiocephalic AV fistula.   Review of the film shows that the fistula is small in caliber throughout  its course.  There are no competing side branches.  There is no  anastomotic stenosis.   PHYSICAL EXAMINATION:  On physical exam today there is an audible bruit  in the fistula.  However, it is small in character and I do not believe  it is going to be large enough to cannulate.  One previous attempt at  cannulation was done and this was unsuccessful.   I believe the best option for the patient would be ligation of her left  Cimino fistula and placement of a left forearm AV graft.  Risks,  benefits, possible complications and procedure details were explained to  the patient today.  She understands and agrees to proceed.  We have  scheduled her graft placement for May 13 with fistula ligation at the  same time.   Janetta Hora. Fields, MD  Electronically Signed   CEF/MEDQ  D:  02/08/2009  T:  02/09/2009  Job:  [id number]   cc:   Garnetta Buddy, M.D.

## 2011-02-19 NOTE — Consult Note (Signed)
NAMELOANY, NEUROTH NO.:  [id number]   MEDICAL RECORD NO.:  [id number]          PATIENT TYPE:  INP   LOCATION:  2110                         FACILITY:  [facility]   PHYSICIAN:  Fayrene Fearing L. Deterding, M.D.DATE OF BIRTH:  Jun 28, 1976   DATE OF CONSULTATION:  DATE OF DISCHARGE:  09/22/2008                                 CONSULTATION   REASON FOR CONSULTATION:  1. End-stage renal disease.  2. Hypertension.  3. Hypoparathyroidism.  4. Anemia.   HISTORY OF PRESENT ILLNESS:  This is a 35 year old female with end-stage  renal disease and diabetes who also has had hypertension.  She was on  peritoneal dialysis for a number of months, up until November when she  had recurrent infections and ultrafiltration failure and went on  hemodialysis.  She had a fistula placed in her right arm on December 7,  but currently dialyses via a Civil engineer, contracting IJ.  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   MEDICATIONS AT HOME:  1. Micardis 20 mg a day.  2. Aciphex 20 mg a day.  3. Clonidine 0.1 mg.  4. Sorbitol p.r.n.  5. Amlodipine 10 mg at night.  6. Levemir 18 units a day.  7. Promethazine 12.5 mg t.i.d. p.r.n.  8. Rena-Vite once a day.  9. She gets EPO at the dialysis unit, 28,000 units each treatment.  10.Hectorol 3 mcg a day.  11.Venofer 100 mg three times a week.  12.__________ mg IV once a month.   PAST MEDICAL HISTORY:  As listed above.   ALLERGIES:  ACE-INHIBITORS.   REVIEW OF SYSTEMS:  HEENT:  She has had no headaches.  She has had  visual difficulties.  She has had a laser surgery in the lab about a  month ago for her diabetic heart disease.  She has no sore throat, dry  eyes, and mouth.  PULMONARY:  She has had no cough, sputum production  but notes she is more short of breath right now after they gave her  fluid in emergency room.  Baseline sleeps on one  pillow and  denies  ankle edema.  GI:  She has had about 4-6 episodes of  nausea, vomiting a  day and 3 to 4 loose stools.   She has had no cough or sputum production.  She has had no skin rash or  arthralgias or neurologic symptoms.   FAMILY HISTORY:  Remarkable  for diabetes in mother.  Father died of  lung cancer.   SOCIAL HISTORY:  She is disabled, has a 15-year-old child.  She is  married.   OBJECTIVE/PHYSICAL EXAMINATION:  GENERAL:  Young female no acute  distress.  VITAL SIGNS:  Temp 102, blood pressure 98/66 on arrival.  She was given  a liter of normal saline  and her sats dropped to 88% here in emergency  room.  She is now 96% on 35% Ventimask.  HEENT:  Diabetic retinopathy status post laser therapy.  NECK:  Without masses or thyromegaly.  CARDIOVASCULAR:  Regular rhythm.  Grade 1-2/6 holosystolic murmur heard  best at the apex.  PMI is 10 cm out from the left midclavicular line in  the fifth intercostal space.  She has 1+ edema.  No bruits are noted.  She does have a fistula in her left forearm which is functional, but not  matured.  LUNGS:  Revealed few crackles at bases.  Decreased breath sounds.  ABDOMEN:  Active bowel sounds.  Soft and nontender.  No organomegaly.  She has a right IJ [employer].  SKIN:  Without rashes.  MUSCULOSKELETAL:  Unremarkable.  NEUROLOGIC:  Cranial nerves II through XII grossly intact.  Motor is 5/5  and symmetric.  Deep tendon reflexes are 1+/4+.   LABORATORY DATA:  Chest x-ray, interstitial edema and  cardiomegaly.  Sodium 127, potassium 5.2, chloride 95, bicarbonate 20, creatinine 0.84,  BUN 28, glucose 145, and albumin 3.  Hemoglobin 11.1, white count is  27.5, platelets 256,000.   ASSESSMENT:  1. Chronic renal failure.  We will need hemodialysis to decrease her      volume __________ scheduled on Monday, Wednesday, and Friday.  We      will try to get her formal orders, these orders that we have are      from the preceding progress  notes.  2. Persistent fevers.  We will request with cath infection versus some      other infection specially could be a viral, nausea, vomiting.      Needed culture and give her antibiotics.  If she has positive      cultures consider  discontinuing the catheter.  3. Diabetes mellitus needs tight control.  4. Hypertension.  Decrease volume and continue her medications and try      to limit and/or cut those back.  5. Anemia.  Continue EPO and iron.  6. Secondary hypoparathyroidism.  7. History of tuberculosis, rule out reactivation.   PLAN:  As above:  1. Hemodialysis.  2. Diabetes control.  3. Cultures.  4. Antibiotics.           ______________________________  Llana Aliment Deterding, M.D.     JLD/MEDQ  D:  10/23/2008  T:  10/23/2008  Job:  [id number]

## 2011-02-19 NOTE — Assessment & Plan Note (Signed)
OFFICE VISIT   [name], [name]  DOB:  17-Sep-1976                                       09/12/2008  CHART#:[id number]   REASON FOR VISIT:  Evaluate for new access.   HISTORY:  This is a 35 year old female I am seeing for evaluation of  permanent access for dialysis.  The patient has a history of  preeclampsia and diabetes.  She has been on dialysis for 4 months and  has been using a right-sided catheter.  She is right-handed.   REVIEW OF SYSTEMS:  GENERAL:  Negative for fevers, chills, weight gain,  weight loss.  CARDIAC:  Negative.  PULMONARY:  Negative.  GI:  Negative.  GU:  As above.  VASCULAR:  Negative.  NEURO:  Negative.  ORTHO:  Negative.  PSYCH:  Negative.  ENT:  Negative.  HEME:  Negative.   SOCIAL HISTORY:  She is married with 1 child.  Does not smoke.  Does not  drink.   FAMILY HISTORY:  Noncontributory.   PAST MEDICAL HISTORY:  1. Diabetes.  2. Hypertension.  3. Hypercholesterolemia.  4. Preeclampsia.   MEDICATIONS:  Amlodipine, Levemir FlexPen, Rena-Vite, simvastatin,  aspirin, PhosLo.   ALLERGIES:  ACE inhibitors.   PHYSICAL EXAMINATION:  Blood pressure 153/92, pulse 83. General:  Well-  appearing, no distress.  Cardiovascular:  Regular rate and rhythm.  Pulmonary:  Lungs are clear bilaterally, left arm has a palpable radial  pulse.   Diagnostic studies:  Vein mapping was performed, she has adequate  cephalic vein for a wrist fistula.   ASSESSMENT/PLAN:  End-stage disease needing permanent access.   PLAN:  The patient will be scheduled for a left wrist fistula.  This  will be done on Thursday, December 17th.  The risks and benefits were  discussed with the patient including risk of steal and risk of non-  maturity.  She understands all of these and is ready to proceed.   Jorge Ny, MD  Electronically Signed   VWB/MEDQ  D:  09/12/2008  T:  09/13/2008  Job:  [id number]   cc:   Garnetta Buddy, M.D.

## 2011-02-19 NOTE — Op Note (Signed)
[name], [name]                   ACCOUNT NO.:  [id number]   MEDICAL RECORD NO.:  [id number]          PATIENT TYPE:  AMB   LOCATION:  SDS                          FACILITY:  [facility]   PHYSICIAN:  Charles E. Fields, MD  DATE OF BIRTH:  01-Jun-1976   DATE OF PROCEDURE:  07/26/2008  DATE OF DISCHARGE:  07/26/2008                               OPERATIVE REPORT   PROCEDURE:  1. Ultrasound of neck.  2. Insertion of right internal jugular vein Diatek catheter.   PREOPERATIVE DIAGNOSIS:  End-stage renal disease.   POSTOPERATIVE DIAGNOSIS:  End-stage renal disease.   ANESTHESIA:  General.   ASSISTANT:  Nurse.   OPERATIVE FINDINGS:  A 23-cm catheter right internal jugular vein.   OPERATIVE DETAILS:  After obtaining informed consent, the patient was  taken to the operating room.  The patient was placed in supine position  on the operating table.  After some sedation, the patient's neck and  chest were prepped and draped in the usual sterile fashion.  Local  anesthesia was then infiltrated over the area of the right internal  jugular vein.  The patient became agitated at this point and a general  anesthetic was commenced.   Next, using ultrasound guidance the right internal jugular vein was  cannulated using introducer needle.  A 0.035 J-tip guidewire was  inserted through the right internal jugular vein down into the inferior  vena cava under fluoroscopic guidance.  Next, sequential 12, 14, and 16-  [place] dilators with peel-away sheath placed over the guidewire into the  right atrium.  Guidewire and dilator were removed.  A 23-cm Diatek  catheter was then placed through the peel-away sheath down into the  right atrium.  The catheter was then tunneled subcutaneously, cut to  length and the hub attached.  The catheter was noted to flush and draw  easily.  Catheter was inspected under fluoroscopy, found its tip to be  in the right atrium, no kinks throughout its course.  The catheter was  then sutured to the skin with nylon sutures.  Neck, insertion site was  closed with Vicryl stitch.  The catheter then loaded with concentrated-  heparin solution.  The patient tolerated procedure well and there were  no complications.  Instrument, sponge, and needle counts were correct at  the end of the case.  The patient taken to recovery room in stable  condition.      Janetta Hora. Fields, MD  Electronically Signed     CEF/MEDQ  D:  07/26/2008  T:  07/27/2008  Job:  [id number]

## 2011-02-19 NOTE — Op Note (Signed)
[name], [name]                   ACCOUNT NO.:  [id number]   MEDICAL RECORD NO.:  [id number]          PATIENT TYPE:  AMB   LOCATION:  SDS                          FACILITY:  [facility]   PHYSICIAN:  Ardeth Sportsman, MD     DATE OF BIRTH:  1976/07/20   DATE OF PROCEDURE:  08/05/2008  DATE OF DISCHARGE:  08/05/2008                               OPERATIVE REPORT   She is followed by [facility], usually Dr. Elvis Coil.   PRIMARY CARE PHYSICIAN:  Juline Patch, MD   SURGEON:  Ardeth Sportsman, MD   ASSISTANT:  None.   PREOPERATIVE DIAGNOSES:  Infected hematoma of the peritoneal dialysis  catheter, oxacillin-sensitive Staphylococcus aureus positive.   POSTOPERATIVE DIAGNOSIS:  Infected hematoma of the peritoneal dialysis,  oxacillin-sensitive Staphylococcus aureus positive.   PROCEDURE PERFORMED:  Removal of infected peritoneal dialysis catheter.   ANESTHESIA:  1. General anesthesia.  2. Local anesthetic and a field block.   SPECIMEN:  Intraperitoneal tip of peritoneal dialysis catheter, sent for  separate culture.   DRAINS:  None.   ESTIMATED BLOOD LOSS:  15 mL.   COMPLICATIONS:  No major complications.   INDICATIONS:  [name] is a 35 year old female who has had progressive  chronic kidney disease, secondary probably due to diabetes and  hypertension, who underwent laparoscopically-assisted peritoneal  dialysis catheter back on March 30, 2008.  The recovery, as far as I  know, was uneventful.  However, she noticed a lump around her catheter  earlier in the month.  At that time, there was no evidence of any  inflammation or infection.  We saw her in clinic and got a CT scan.  It  showed a fluid collection near the PD catheter.  Aspiration of the fluid  was sent and it came back positive for Staph aureus.  Given the fact  that there was infection around the catheter, we felt that it could not  be salvaged and needed to be removed, and the [facility]  requested to remove as well.   The technique of this removal discussed.  Risks and benefits and  alternatives discussed and the patient agreed to proceed.   OPERATIVE FINDINGS:  She had old hematoma in the left rectus muscle of  the anterior abdominal wall.  There was no mass or purulence remaining.  She had well encapsulated cuffs.  She did have some bleeding in her  muscle compartments, since this went through the rectus muscle, but  hemostasis was excellent at the end of the case.  There was no evidence  of any significant intraperitoneal adhesions or peritonitis.   DESCRIPTION OF PROCEDURE:  Informed consent was confirmed.  The patient  underwent general anesthesia without any difficulty.  She received IV  cefazolin just prior to surgery.  She was already on oral Augmentin,  started yesterday.  I went ahead and there was a punctate opening in the  left lower quadrant, where the prior PD catheter had been placed.  I  want to open it up and released a little bit of some  mild fat necrosis.  We opened up the wound transversely about 4 cm to get down to the PD  catheter.  The PD catheter was then trimmed at the midline, opened at  the skin, and was able to be brought out through the deeper wound.  The  subcutaneous cuff was easily freed off.  There was dense inflammation of  the more distal cuff in the preperitoneal plane, but after some careful  sharp dissection, I was able to get around it and remove the PD catheter  intact.   The intraperitoneal peritoneal dialysis catheter tip was sent separately  for culture.  The abdominal peritoneum showed no evidence of any  significant peritonitis.  There was some muscle bleeders that were  controlled with 0 Vicryl figure-of-eight stitches x3.  Cautery was used  as well.  Copious irrigation with 1 L of saline was done into the wound,  and the fascial defect was closed using 0 Vicryl stitch, transversely  interrupted x4 stitches.   The wound was irrigated copiously using 500  mL.  The wound was partially closed using 2-0 vertical mattress nylon  stitches and a half-inch wide iodoform gauze times about 2 feet was  packed into the subcutaneous wound.   The patient was extubated and was taken to the recovery room in stable  condition.  I called the patient's husband to explain the operative  findings.  Given the fact that she has been pretty stable at home with  active infection going on, I was hopeful she can go home, continue her  oral antibiotics as long as her pain control was adequate on pain pills.  Otherwise, we will keep her on IV antibiotics and follow up.  May have  to have nephrology involvement.  She is to continue her hemodialysis  tomorrow at [facility] and I think that is reasonable to do.      Ardeth Sportsman, MD  Electronically Signed     SCG/MEDQ  D:  08/05/2008  T:  08/06/2008  Job:  [id number]   cc:   Juline Patch, M.D.  [facility]

## 2011-02-19 NOTE — Assessment & Plan Note (Signed)
OFFICE VISIT   [name], [name]  DOB:  11-04-1975                                       02/01/2009  JWJXB#:[id number]   The patient is a 35 year old female who dialyzes on Monday, Wednesday  and Friday.  She underwent placement of a left radiocephalic AV fistula  by Dr. Hart Rochester on 09/22/2008.  They have been trying to use this fistula  unsuccessfully.  She states that they have to stick it multiple times  and they have had frequent infiltrations.  She currently is dialyzing  via Diatek catheter.   PHYSICAL EXAMINATION:  On physical exam today blood pressure is 116/80  in the right arm, pulse is 99 and regular.  She has a well-healed scar  at her left wrist and a palpable thrill in the left radiocephalic AV  fistula.  Fistula overall is fairly small between 3 and 4 mm in diameter  by palpation.  There is a palpable thrill in the fistula up to the mid  upper arm.   I believe the best option for her would be to obtain a fistulogram to  see if we can find a reason for the nonmaturation of the fistula.  If  there is a narrowing or significant side branches we could consider  revising the fistula.  However, if this is just overall a small vein  then we would need to consider placing a new access.  I discussed this  with the patient and she will follow up with me next Wednesday after her  fistulogram.   Janetta Hora. Fields, MD  Electronically Signed   CEF/MEDQ  D:  02/01/2009  T:  02/02/2009  Job:  [id number]

## 2011-02-19 NOTE — H&P (Signed)
Alicia, [name] NO.:  [id number]   MEDICAL RECORD NO.:  [id number]          PATIENT TYPE:  INP   LOCATION:  3310                         FACILITY:  [facility]   PHYSICIAN:  Eduard Clos, MDDATE OF BIRTH:  24-Dec-1975   DATE OF ADMISSION:  10/22/2008  DATE OF DISCHARGE:                              HISTORY & PHYSICAL   PRIMARY CARE PHYSICIAN:  Juline Patch, M.D.   CHIEF COMPLAINT:  Fever and shortness of breath.   HISTORY OF PRESENT ILLNESS:  The patient is a 35 year old female with a  history of ESRD on hemodialysis, diabetes mellitus type 2, hypertension,  and hyperlipidemia, referred to the emergency room. The patient had a  fever of 103 with nausea and vomiting and hypoxia. The patient was found  to have a WBC count of 27,000 and persistent fever. The patient admitted  for further evaluation of her early sepsis. The patient is still febrile  around 102. The patient is also complaining of nausea and vomiting with  epigastric pain over the last 2 days. She states that the pain started  and then she started having nausea and vomiting. The patient denies any  diarrhea. Denies any chest pain. She denies any loss of consciousness,  weakness of limbs, palpitations, dysuria, discharges.   PAST MEDICAL HISTORY:  1. Hypertension.  2. ESRD on hemodialysis.  3. Diabetes mellitus type 2.  4. Hyperlipidemia.   PAST SURGICAL HISTORY:  1. Right eye surgery.  2. AV fistula placement.   ADMISSION MEDICATIONS:  Per the ER chart:  1. Aciphex 20 mg p.o. daily.  2. Amlodipine 10 mg p.o. daily.  3. Clonidine 0.2 mg.  4. Lexapro 10 mg daily.  5. Micardis 20 mg p.o. daily.  6. Percocet.  7. RenaVite.  8. Simvastatin.  I tried to call her husband to confirm the medications. I was unable to  reach.   ALLERGIES:  NO KNOWN DRUG ALLERGIES.   SOCIAL HISTORY:  The patient lives with her husband. Denies smoking  cigarettes or drinking alcohol or using illegal  drugs.   REVIEW OF SYSTEMS:  As per the HPI, nothing else significant.   PHYSICAL EXAMINATION:  GENERAL:  The patient examined at bedside. Not in  acute distress but the patient does complain that she gets short of  breath.  VITAL SIGNS:  Blood pressure 180/86, pulse 110 per minute, temperature  102, respiratory rate 20 per minute, O2 saturation 95%.  HEENT:  Nonicteric. No pallor.  CHEST:  Bilateral air entry present. Mild basilar crepitation. No  rhonchi.  HEART:  S1 and S2 heard.  ABDOMEN:  Soft, nontender. Bowel sounds heard. No discoloration.  NEUROLOGIC:  Alert and oriented to time, place, and person. Moves upper  and lower extremities 5/5.  EXTREMITIES:  Peripheral pulses felt.  No edema.   LABORATORY DATA:  ABG:  PH of 7.4, pCO2 36, pO2 70, bicarbonate 24.1.  Oxygen saturation 94%. CBC:  WBC is 27.5. Hemoglobin and hematocrit 11.1  and 34.7. Platelets 256,000. Neutrophils 93%. Complete metabolic panel:  Sodium 127, potassium 5.2, chloride 95, carbon dioxide  20, glucose 145,  BUN 28, creatinine 8.3. Blood cultures are pending.   DIAGNOSTIC STUDIES:  Chest x-ray shows increased interstitial markings  and interstitial thickening, suspicious for pulmonary edema or volume  overload. Stable cardiomegaly and left upper lobe air space disease. CT  chest without contrast done on October 11, 2008 is read as improvement in  numerous pulmonary nodules and air space opacity as described. Findings  are most consistent with residual recurrent infection.  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  Cardiomegaly with small pericardial effusions similar; no  definite thoracic adenopathy given limitation of unenhanced CT.   ASSESSMENT:  1. Possible early sepsis, source not clear.  2. 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  3. Fluid overload, started on hemodialysis.  4. Abdominal pain.  5. Diabetes mellitus type 2.  6. Hyperlipidemia.  7.  Hypertension.   PLAN:  Will admit patient to the step-down unit. Will place patient on  IV antibiotics including Vancomycin and Fortaz. Will get acute abdominal  series, check for lipase. Will place patient on respiratory precautions.  Get sputum for AFB. I discussed with Dr. Darrick Penna of nephrology and  further recommendations per nephrology.      Eduard Clos, MD  Electronically Signed     ANK/MEDQ  D:  10/22/2008  T:  10/23/2008  Job:  [id number]

## 2011-02-19 NOTE — Op Note (Signed)
NAMEARICELA, [name]                   ACCOUNT NO.:  [id number]   MEDICAL RECORD NO.:  [id number]          PATIENT TYPE:  AMB   LOCATION:  SDS                          FACILITY:  [facility]   PHYSICIAN:  Quita Skye. Hart Rochester, M.D.  DATE OF BIRTH:  [birth year]   DATE OF PROCEDURE:  09/22/2008  DATE OF DISCHARGE:  09/22/2008                               OPERATIVE REPORT   PREOPERATIVE DIAGNOSIS:  End-stage renal disease.   POSTOPERATIVE DIAGNOSIS:  End-stage renal disease.   OPERATION:  Creation of left radial artery to cephalic vein  arteriovenous fistula (Cimino shunt).   SURGEON:  Quita Skye. Hart Rochester, MD   FIRST ASSISTANT:  Nurse.   ANESTHESIA:  Local.   PROCEDURE IN DETAIL:  The patient was taken to the operating room and  placed in supine position at which time the left upper extremity was  prepped with Betadine scrub and solution and draped in routine sterile  manner.  After infiltration with 1% Xylocaine with epinephrine, a  longitudinal incision was made between the radial artery and cephalic  vein just proximal to the wrist.  Cephalic vein was dissected free,  branches ligated with 4-0 silk ties and divided.  It was ligated  distally and transected and gently dilated with heparinized saline.  It  was about a 3-3.5 mm vein to be dilated up to the mid forearm and seemed  adequate on preoperative vein mapping.  Artery was exposed beneath the  fascia.  It was a 2.5 mm radial artery with a good pulse.  No heparin  was given.  Artery was occluded proximally and distally with vessel  loops, opened with 15 blade, extended with Potts scissors, and would  accept a 2.5 mm dilator.  Vein was carefully measured and spatulated and  anastomosed end-to-side with 6-0 Prolene.  Clamps were then released.  There was an excellent pulse and thrill in the fistula and good Doppler  flow up to the antecubital area.  Adequate hemostasis was achieved and  wound closed in layers with Vicryl in subcuticular  fashion.  Sterile  dressing was applied.  The patient was taken to the recovery room in  satisfactory condition.      Quita Skye Hart Rochester, M.D.  Electronically Signed     JDL/MEDQ  D:  09/22/2008  T:  09/22/2008  Job:  [id number]

## 2011-02-19 NOTE — Procedures (Signed)
CEPHALIC VEIN MAPPING   INDICATION:  Preop AVF.   HISTORY:  ESRD.   EXAM:  The right cephalic vein was not evaluated.   The left cephalic vein is compressible.   Diameter measurements range from 0.28 cm to 0.48 cm.   See attached worksheet for all measurements.   IMPRESSION:  Patent left cephalic vein which is of acceptable diameter  for use as a dialysis access site.   ___________________________________________  V. Charlena Cross, MD   AS/MEDQ  D:  09/12/2008  T:  09/12/2008  Job:  [id number]

## 2011-02-19 NOTE — Op Note (Signed)
NAMECHRISTASIA, [name]                   ACCOUNT NO.:  [id number]   MEDICAL RECORD NO.:  [id number]          PATIENT TYPE:  AMB   LOCATION:  SDS                          FACILITY:  [facility]   PHYSICIAN:  Ardeth Sportsman, MD     DATE OF BIRTH:  [birth year]   DATE OF PROCEDURE:  03/30/2008  DATE OF DISCHARGE:                               OPERATIVE REPORT   PRIMARY CARE PHYSICIAN:  Juline Patch, M.D.   NEPHROLOGIST:  Garnetta Buddy, M.D.   PREOPERATIVE DIAGNOSIS:  Chronic kidney disease progressing  to end-  stage renal disease stage V.   POSTOPERATIVE DIAGNOSIS:  Chronic kidney disease progressing to end-  stage renal disease stage V.   PROCEDURE PERFORMED:  Diagnostic laparoscopy with placement of  peritoneal dialysis catheter.   ANESTHESIA:  1. General anesthesia.  2. Local anesthetic and a field block for all port sites and catheter      sites.   SPECIMENS:  None.   DRAINS:  Tyco left-sided swan-neck coiled catheter, catalog #[id number],  lot #[id number].   ESTIMATED BLOOD LOSS:  Less than 5 mL.   COMPLICATIONS:  None apparent.   INDICATIONS:  Ms. [name] is a 35 year old female with progressive chronic  kidney disease, felt to be secondary to primarily diabetes and  hypertension.  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  Options discussed and she  wished to proceed with peritoneal dialysis catheter placement.  Technique of placement was discussed.  Because of her prior pelvic  surgery, recommendation was made for diagnostic laparoscopy to make sure  that she did not have significant prohibitive adhesions.  Risks,  benefits, and alternatives were discussed.  Questions were answered.  She agreed to proceed.   OPERATIVE FINDINGS:  She did not have any significant intra-abdominal or  pelvic adhesions.  Catheter rest well in her left lower quadrant and  tunneled inferomedially towards the midline.   DESCRIPTION OF PROCEDURE:  Informed consent was confirmed.  The patient  received IV cephazolin  just prior to surgery.  She had sequential  compression devices active during the entire case.  She was positioned  supine, both arms tucked.  She underwent general anesthesia without any  difficulty.  Her abdomen was prepped and draped in sterile fashion using  a DuraPrep.   A 5-mm port was placed in a left upper quadrant using optical entry  technique with the patient's deep reversed in Trendelenburg and left  side up.  A camera inspection revealed no intra-abdominal injury and  notes major adhesions.  Under direct visualization, a 10-mm dilating  port was placed on left lower quadrant.  Inspection was made down to the  pelvis and there was no significant adhesion and I could easily get  passed down into the cul-de-sac of the pelvis.  The swan-neck peritoneal  dialysis Cook catheter was placed through the dilating port with an  internal guidewire.  It went down to the pelvis very easily.  A  guidewire was pulled out.  The port was pulled out partially.  The  larger distal cuff was positioned such that the rough  cuff was in the  preperitoneal plane and the soft plastic ball was the only thing exposed  to the peritoneal cavity.  Fascial defect closed down over it very well.  Of note, the catheter had been tunneled at 45-degree angle inferiorly.  The catheter was tunneled towards the midline very easily.  Catheter  flushed and aspirated very easily and inspection revealed no adhesions  or bleeding or subsequent intra-abdominal problems.  Capnoperitoneum was  actually evacuated and rest of the ports were removed.  The catheter  attachments were placed.  The skin was closed using 4-0 Monocryl stitch  at 2 port sites and no stitches were placed around the PV catheter as it  exited.  Sterile dressing was applied.  The patient was extubated to the  recovery room in stable condition.   I am about to explain the operative findings to the patient's family.  I  discussed with the patient reports  following with peritoneal dialysis  catheter nurses through recommendation of [facility].  She could followup with me p.r.n. if the nurses have concerns about the  catheter, but hopefully things will go well.  She should be able to go  home later if she remains medically stable.      Ardeth Sportsman, MD  Electronically Signed     SCG/MEDQ  D:  03/30/2008  T:  03/31/2008  Job:  [id number]   cc:   Juline Patch, M.D.  Garnetta Buddy, M.D.

## 2011-02-19 NOTE — Assessment & Plan Note (Signed)
OFFICE VISIT   [name], [name]  DOB:  11/15/1975                                       11/08/2008  CHART#:[id number]   Patient had a left forearm (Cimino) fistula created by me on 12/17 for  end-stage renal disease.  She is currently being dialyzed through a  Diatek catheter in the right internal jugular vein.   She has had no pain or numbness in the left hand.  The fistula has an  excellent pulse and palpable thrill up to the antecubital area.  The  incision has healed nicely.  There is no evidence of steal.   Hopefully this will provide a satisfactory site for vascular access and  can be used in mid March.   Quita Skye Hart Rochester, M.D.  Electronically Signed   JDL/MEDQ  D:  11/08/2008  T:  11/09/2008  Job:  [id number]   cc:   Garnetta Buddy, M.D.

## 2011-02-19 NOTE — Assessment & Plan Note (Signed)
[facility]                            CARDIOLOGY OFFICE NOTE   [name], [name]                            MRN:          [id number]  DATE:03/14/2008                            DOB:          13-May-1976    A 35 year old patient referred by Dr. Hyman Hopes for chest pain, dyspnea, pre-  op clearance.  The patient has kidney failure and needs a peritoneal  dialysis catheter placed.  Unfortunately, her surgery is scheduled for  tomorrow.  I told the patient there is no way that I could clear her for  surgery in 1 day.   She is an over-20-year diabetic who has only been treated with insulin  for the last 2 years.  She still appears to have poor control.  Her  fasting sugars are 120-130.  I do not have a hemoglobin A1c on her.  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   She has never had a cardiac workup.  She does get exertional dyspnea.  She tries to walk every day.  She has a 34-year-old at home.  Dyspnea is  functional and it has not worsened over the last few years.  There is no  active wheezing, cough, or sputum production.  She does get a cough with  ACE inhibitors.   As far as I know she has not been tried on an ARB and has significant  proteinuria.   She had an episode of chest pain in Dr. Marland Mcalpine office on May 27.   The pain lasts about 2 hours and was associated with diaphoresis.  It  was nonexertional.  He thought it was atypical for angina.  However,  this also necessitated the pre-op clearance exam by cardiology   I talked to the patient.  She does not get chest pain very often.  She  has maybe had it only 2 to 3 times in the last couple years.  I suspect  it had more to do with a squeezing sensation from her dyspnea.  There is  no pleuritic component.  No syncope, no palpitations.   REVIEW OF SYSTEMS:  Otherwise negative.   PAST MEDICAL HISTORY:  Remarkable for a C-section and 2007, right eye  surgery due to diabetes.   The patient is married.  She is from [place].  She is a stay-at-home mom.  She tries to walk on a regular basis.  She does not smoke or drink.  She  has a 2-year all son named Advertising account planner at home.   FAMILY HISTORY:  Remarkable for diabetes in her mother who is still  alive.  Father died of lung cancer.   MEDICATIONS:  1. Templar 1 mcg caps 1 tablet a day.  2. Levemir only in the morning unknown units.  3. Amlodipine 10 mg a day.   Her she has no known allergies.   EXAM:  Remarkable for a young [place] female in no distress.  Pulse is 92, blood pressure is 150/89, respiratory rate 14, afebrile.  Affect appropriate.  HEENT:  Unremarkable.  Carotids are normal without bruit, no lymphadenopathy, thyromegaly JVP  elevation.  LUNGS:  Clear diaphragmatic motion.  No wheezing.  S1-S2 normal heart sounds.  PMI normal.  ABDOMEN:  Benign.  Bowel sounds positive.  No AAA no bruit, no  hepatosplenomegaly.  No tenderness.  Distal pulse intact, no edema.  NEURO:  Nonfocal.  SKIN:  Warm and dry.  No muscular weakness.   EKG is normal.   IMPRESSION:  1. Pre-op clearance.  The patient needs a stress Myoview and I will      try to order soon as possible, but surgery will not be able to be      done tomorrow.  2. Dyspnea.  Check 2-D echocardiogram to rule out cardiomyopathy.  No      obvious murmurs and a normal EKG would suggest normal LV function.  3. Hypertension, borderline control.  Suspect she could tolerate an      ARB.  Will leave this up to Dr. Hyman Hopes to see if he wants to start      this in lieu of ACE inhibitor.  4. Diabetes very poorly controlled.  Her fasting sugars are in the      130.  I suspect her hemoglobin A1c is still 7 to 8.  I urged her to      follow up with Dr. Ricki Miller.  She will likely need additional insulin      at night.  It may be better to split her dose in the morning and      afternoon and switch to Lantus.  She also may benefit from an       insulin-sensitizing agent.  5. Retinopathy follow up with ophthalmology.  Again tight sugar      control at the in order here.   Hopefully, the patient's Myoview will be nonischemic and her LV function  will be normal, and we can clear her for surgery but  there is no way  that we can clear her in 24 hours.     Noralyn Pick. Eden Emms, MD, [facility]  Electronically Signed    PCN/MedQ  DD: 03/14/2008  DT: 03/14/2008  Job #: [id number]   cc:   Ardeth Sportsman, MD  Garnetta Buddy, M.D.

## 2011-02-22 NOTE — Consult Note (Signed)
[name], SARWAR NO.:  [id number]   MEDICAL RECORD NO.:  [id number]           PATIENT TYPE:   LOCATION:                                 FACILITY:   PHYSICIAN:  Shan Levans, M.D. LHCDATE OF BIRTH:  12-Oct-1975   DATE OF CONSULTATION:  04/17/2006  DATE OF DISCHARGE:                                   CONSULTATION   PULMONARY CONSULTATION:   CHIEF COMPLAINT:  Evaluate left upper lobe lesion, history of positive PPD.   HISTORY OF PRESENT ILLNESS:  This is a 35 year old Asian female admitted to  the obstetrics service at [facility] for preeclampsia and hypertension  with proteinuria.  The patient is [redacted] weeks pregnant, has underlying  diabetes.  She came to this country 35 years ago with her green card, found  to have a positive PPD and an abnormal chest x-ray with left upper lobe  changes.  We do not have the full report from this, as the data has been  lost to microfilm at the health department.  She received isoniazid and  rifampin for only 2 months and had incomplete treatment for this.  We are  asked to evaluate for potential bronchoscopy.  She has had 1 negative sputum  smear for AFB.  She has absolutely no respiratory complaints of any kind.  There are no fevers, chills, sweats, or constitutional symptoms.  There is  no cough, dyspnea, chest pain, or hemoptysis.   PAST MEDICAL HISTORY:  She is [redacted] weeks pregnant, has hypertension, diabetes,  proteinuria, preeclampsia, and otherwise noncontributory.  She is a  nonsmoker.   SOCIAL HISTORY:  Otherwise noncontributory.   CURRENT MEDICATIONS:  1.  Insulin with associated NPH dosing and sliding scale.  2.  Prenatal vitamins.   PHYSICAL EXAMINATION:  GENERAL:  This is a well-developed, well-nourished  Asian female in no distress.  VITAL SIGNS:  Blood pressure 138/89, pulse 92, respirations 20, temp 97.  HEENT:  No jugular venous distention, no lymphadenopathy.  Oropharynx clear.  Neck supple.  CHEST:  Showed to be entirely clear to auscultation and percussion.  There  is no evidence of wheeze or rhonchi.  CARDIAC:  Exam showed a regular rate and rhythm without S3, normal S1 and  S2.  ABDOMEN:  Soft, nontender, 29-week pregnant uterus.  EXTREMITIES:  Showed no edema or clubbing.   LABORATORY DATA:  Chest x-ray showed scarring and density in left upper  lobe.  Sputum AFB smear is negative on April 15, 2006.  White count is 16.8,  hemoglobin 9.5.  Sodium 135, potassium 3.8, chloride 112, CO2 of 20, BUN 23,  creatinine 1.  Liver functions unremarkable.  Chest x-ray shows left upper  lobe density, unclear whether there is cavitation.   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   RECOMMENDATIONS:  Would complete prophylactic treatment with isoniazid after  pregnancy is complete for 6 additional months per the health department.  We  will obtain a CT scan of the chest to further clarify and typify the left  upper lobe lesion, and would not recommend bronchoscopy at this time.      Shan Levans, M.D. [facility]  Electronically Signed     PW/MEDQ  D:  04/17/2006  T:  04/18/2006  Job:  [id number]   cc:   [name], M.D.   [name], M.D.  Fax: [phone]

## 2011-02-22 NOTE — Op Note (Signed)
NAMEAUBREA, [name]                   ACCOUNT NO.:  [id number]   MEDICAL RECORD NO.:  [id number]          PATIENT TYPE:  AMB   LOCATION:  SDS                          FACILITY:  [facility]   PHYSICIAN:  Juleen China IV, MDDATE OF BIRTH:  1976/05/03   DATE OF PROCEDURE:  02/20/2009  DATE OF DISCHARGE:  02/16/2009                               OPERATIVE REPORT   PREOPERATIVE DIAGNOSIS:  End-stage renal disease.   POSTOPERATIVE DIAGNOSIS:  End-stage renal disease.   PROCEDURES PERFORMED:  1. Ligation of the left wrist arteriovenous fistula.  2. Left forearm arteriovenous Gore-Tex graft.   TYPE OF ANESTHESIA:  General.   BLOOD LOSS:  Minimal.   FINDINGS:  A left forearm loop graft was placed.  This was a 6-mm Gore-  Tex graft.  The venous anastomosis was performed end-to-end to the  basilic vein.   INDICATIONS:  This is a 35 year old female with end-stage renal disease  who has previously undergone a left radiocephalic fistula.  This is felt  to mature.  A venogram revealed this to be an unacceptable conduit for  maturation.  She therefore comes in for ligation and placement of the  graft.   PROCEDURE:  The patient was identified in the holding area and taken to  room 6.  She was placed supine on the table.  General anesthesia was  administered.  The patient was prepped and draped in standard sterile  fashion.  A time-out was called and antibiotics were given.  The  patient's previous incision in the left wrist was opened with number 10  blade.  A sharp dissection was performed to identify the cephalic vein.  The cephalic vein was then ligated and divided between two 2-0 silk  ties.  Once this was performed, subcutaneous tissue was reapproximated  with 3-0 Vicryl.  Skin was closed with 4-0 Vicryl.  Attention was then  turned towards the antecubital crease.  A transverse incision was made  in the antecubital crease.  The basilic vein was readily identified  within the incision.   There was a 3-mm vein.  It was mobilized  proximally and distally, and encircled with a vessel loop.  Next, the  bicipital aponeurosis was divided sharply and the brachial artery was  identified.  It was then mobilized proximally and distally for about 2  cm, and circled with red vessel loops.  A counter incision was then made  in the distal forearm.  Subcutaneous tunnel was then created and an 6-mm  stretch Gore-Tex graft was brought through the tunnel.  At this point in  time, the patient was given systemic heparinization.  After the heparin  had circulated, the brachial artery was occluded with vascular clamps  and #11 blade was used to make an arteriotomy, which was extended with  Potts scissors.  The graft was then beveled to fit the size of the  arteriotomy and an end-to-side anastomosis was created with a running 6-  0 Prolene.  Prior to completion of the anastomosis, the artery was  flushed in antegrade and retrograde fashion.  There was an  excellent  pulsatile flow through the graft.  The graft was then flushed with  heparinized saline and reoccluded.  Next, the basilic vein was  transected.  The distal end of wrist was ligated with 2-0 silk tie.  There was good backbleeding from the basilic vein and flushed easily  with heparinized saline.  The end was then spatulated and an end-to-side  anastomosis was created using running 6-0 Prolene.  Prior to completion,  the graft was flushed and the vein was flushed appropriately.  The  anastomosis was then completed.  There was an excellent thrill within  the graft.  Upon completion, the patient continued to have a palpable  radial  pulse.  Next, wound was copiously irrigated.  The tissue was  reapproximated with 3-0 Vicryl and the skin was closed with 4-0 Vicryl.  Dermabond was placed.  The patient was successfully awakened from  anesthesia and taken to recovery room in stable vision.  No  complications.      Jorge Ny,  MD  Electronically Signed     VWB/MEDQ  D:  02/20/2009  T:  02/21/2009  Job:  [id number]

## 2011-02-22 NOTE — Discharge Summary (Signed)
[name], [name]                   ACCOUNT NO.:  [id number]   MEDICAL RECORD NO.:  [id number]          PATIENT TYPE:  INP   LOCATION:  9159                          FACILITY:  [facility]   PHYSICIAN:  Tracy L. Mayford Knife, M.D.DATE OF BIRTH:  12-27-1975   DATE OF ADMISSION:  04/10/2006  DATE OF DISCHARGE:  04/22/2006                                 DISCHARGE SUMMARY   REASON FOR ADMISSION:  Elevated blood pressure in an insulin-dependent  diabetic who has nephrotic range proteinuria and hypertension.   DISCHARGE DIAGNOSES:  1.  A 30-week intrauterine pregnancy.  2.  Preeclampsia superimposed on chronic hypertension and nephrotic range      proteinuria.  3.  Nephrotic range proteinuria.  4.  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  5.  History of low malignant potential ovarian tumor.  6.  Insulin-dependent diabetes mellitus class RF.   LABORATORY DATA:  Admission labs showed hemoglobin 10.7, platelets 336.  Creatinine 1.1.  LFTs within normal limits.  Uric acid 7, LDH 118.  Discharge labs:  Hemoglobin 9.4, platelets 342.  Creatinine 1.  LFTs within  normal limits.  LDH 117, uric acid 6.9.  Urine culture negative.  Sputum  acid fast bacilli smear is negative.  Culture is negative to date.  A 24-  hour urine April 10, 2006 showed total protein of 9634 mg with a creatinine  clearance of 82 and 24-hour urine from April 14, 2006 showed 24-hour protein  of 14.1 gm with creatinine clearance of 98.   CONSULTATIONS:  1.  [name], M.D., infectious disease.  2.  Neonatology.  3.  Dr. Ander Slade, perinatology.  4.  Shan Levans, M.D., pulmonology.   IMAGING:  Ultrasound on April 10, 2006 showed normal fluid, normal umbilical  artery Dopplers.  Repeat ultrasound for growth on April 22, 2006 showed a  cephalic infant, grade 1 placenta, AFI 16.  Estimated fetal weight 1521 (50-  75th percentile), BPD  32, head circumflex 32, AC 30 and 1 day, SL was the  femur length 29 weeks 1 day.  Cervix 3 cm by transabdominally.   HOSPITAL COURSE:  The patient is a 35 year old, G1, P0 at 28-6/7th weeks  gestational age dated by a 6-week ultrasound that gives her [facility] of June 26, 2006.  There is a history of insulin-dependent diabetes class RF and  chronic hypertension.  She presented to the high risk clinic with elevated  blood pressures 157/97.  She reported seeing spots but denied any headache,  chest pain, shortness of air, right upper quadrant pain.  She also  complained of some nausea.  Her pregnancy was also complicated by nephrotic  range proteinuria from the beginning of pregnancy.  The 5 gm which increased  to 9 gm just prior to admission.  The patient was admitted for further  workup.  Her initial labs are as above.   Perinatology was consulted.  A 24-hour urine was repeated.  The patient was  given  betamethasone and observed for signs of worsening preeclampsia.  The  patient's blood pressures remained stable with systolics running between 120  to 159 and diastolic ranging from 70 to 90s.  Fetal monitoring was always  reassuring.  Labs also remained stable.  Based on all of these things,  delivery was not felt to be indicated.   Other issues during this hospitalization is insulin-dependent diabetes class  RF.  The patient had been on NPH in the a.m. and p.m.  On admission, this  dose was increased and NovoLog was added to her meals.  The patient was kept  under tight control during this hospitalization.   Nephrotic range proteinuria:  As described above, the patient's baseline  protein was 5 gm per day, repeat on admission was 9 gm and then it was  repeated again during this hospitalization and was 14 gm.  Perinatology was  made aware and did not feel that she should be delivered based on  proteinuria only.  Dr. Hyman Hopes, a nephrologist, was contacted and he did not  feel that anything  further needed to be done for her management at this  time.  He recommended tight glycemic control and tight blood pressure  control.  However, he understands that tight blood pressure control is not  possible at this time.  He would like to see her postpartum.   Positive PPD:  The patient has a history of having partial treatment for her  tuberculosis in 2003.  At that time, she also had an abnormality on her  chest x-ray.  A chest x-ray was repeated during this hospitalization and  there was a left upper lobe confluent airspace opacity within the  differential diagnosis.  Multiple sputums were collected and were all  negative.  Dr. Ninetta Lights and Dr. Delford Field were consulted.  A CT scan was done  and showed central left upper lobe infiltrate with other small well-defined  nodules seen mainly in the left lobe and to a lesser degree in the right  upper lobe. This is suspicious for an infectious etiology and tuberculosis  remains within the differential diagnosis.  Based on these significant  findings on CT scan, it was decided the patient needed to be started on  tuberculosis medications.  She will be on Rifampin, INH, pyrazinamide,  ethambutol for two months and then her regimen will be reduced to Rifampin,  isoniazid, ethambutol for four more months.  Dr. Ninetta Lights will follow her and  she will also need a bronchoscopy after delivery.   History of low malignant potential ovarian tumor:  The patient was treated  for this in [place] in 2005 with a cystectomy.  At the beginning of the  pregnancy, she was found to have a cyst on her right ovary which on  subsequent ultrasound was resolved.  She will need follow up on this  following her delivery.   DISPOSITION:  Home in stable condition.   FOLLOW UP:  1.  Follow up [facility] on April 24, 2006 at 10:45 for an NST      and BPP.  The patient in twice for testing. 2.  Dr. Ninetta Lights on May 07, 2006 at 11:15.  He will also arrange follow  up      with the health department.   DISCHARGE MEDICATIONS:  1.  Rifampin 300 mg 2 tablets by mouth daily.  2.  Isoniazid 300 mg by mouth daily.  3.  Ethambutol 400 mg 3 tablets by mouth daily.  4.  Pyrazinamide 500 mg 3 tablets p.o. daily.  5.  Vitamin B6 50 mg by mouth daily.  6.  NPH 24 units subcutaneous every morning, NPH 18 units subcutaneous every      night.  7.  NovoLog 4 units at breakfast, 6 units at lunch and 6 units at dinner.   ACTIVITY:  Bed rest.   DIET:  ADA diet.           ______________________________  Marc Morgans. Mayford Knife, M.D.     TLW/MEDQ  D:  04/22/2006  T:  04/22/2006  Job:  [id number]   cc:   [name], M.D.  Fax: [phone]

## 2011-02-22 NOTE — Op Note (Signed)
[name], [name] NO.:  [id number]   MEDICAL RECORD NO.:  [id number]          PATIENT TYPE:  INP   LOCATION:                                [facility]  [facility]   PHYSICIAN:  Ginger Carne, MD  DATE OF BIRTH:  05/13/1976   DATE OF PROCEDURE:  DATE OF DISCHARGE:  05/25/2006                                 OPERATIVE REPORT   PREOPERATIVE DIAGNOSIS:  Nonreassuring fetal heart rate.   POSTOPERATIVE DIAGNOSES:  1. Nonreassuring fetal heart rate.  2. Preterm viable delivery of infant.  3. Occult cord prolapse.   PROCEDURE:  Primary low transverse cesarean section.   SURGEON:  Dr. Mia Creek.   ASSISTANT:  None.   COMPLICATIONS:  None immediate.   ESTIMATED BLOOD LOSS:  600 mL.   SPECIMEN:  Cord pH, cord bloods and placenta, and ascitic fluid for acid-  fast, Gram's stain and culture.   OPERATIVE FINDINGS:  Preterm infant delivered in vertex presentation with  occult cord prolapse, which was in a vertex presentation.  Approximately  1000 mL of clear ascitic fluid was noted in the peritoneal cavity.  Uterus,  tubes and ovaries showed normal decidual changes of pregnancy.  Occult cord  prolapse was observed with the cord in front of the fetal head.  Placenta  was complete, three-vessel cord, central insertion.   OPERATIVE PROCEDURE:  The patient prepped and draped in the usual fashion,  and placed in the left lateral supine position.  Betadine solution used for  antiseptic, and the patient was catheterized prior to procedure.  After  adequate epidural analgesia, a Pfannenstiel incision was made and the  abdomen opened.  Lower uterine segment incised transversely.  Baby  delivered, cord clamped and cut, and infant given to the pediatric staff,  after bulb suctioning.  Placenta removed manually.  Uterus inspected.  Closure of the uterine musculature in one layer with 0-0 Vicryl running  suture.  Bleeding points hemostatically checked.  All blood clots removed.  Closure of the parietal  peritoneum with 2-0 Vicryl suture, 0-0 Vicryl running for the fascia, and  skin staples for the skin.  Instrument and sponge counts were correct.  The  patient tolerated procedure well, taken to post anesthesia recovery room in  excellent condition.      Ginger Carne, MD  Electronically Signed     SHB/MEDQ  D:  05/21/2006  T:  05/21/2006  Job:  [id number]

## 2011-02-22 NOTE — Discharge Summary (Signed)
[name], [name] NO.:  [id number]   MEDICAL RECORD NO.:  [id number]          PATIENT TYPE:  INP   LOCATION:  4702                         FACILITY:  [facility]   PHYSICIAN:  Altha Harm, MDDATE OF BIRTH:  [birth year]   DATE OF ADMISSION:  10/22/2008  DATE OF DISCHARGE:  10/24/2008                               DISCHARGE SUMMARY   The patient left against medical advice on October 24, 2008.   DIAGNOSES AT DISCHARGE:  1. Left lower lobe pneumonia.  2. Gram positive cocci bacteremia.  3. End-stage renal disease, hemodialysis dependent.   MEDICATIONS AT DISCHARGE:  None.   CONSULTANTS:  Mick Sell, MD, infectious diseases.   CODE STATUS:  Full code.   ALLERGIES:  No known drug allergies.   PROCEDURES:  None.   DIAGNOSTIC STUDIES:  1. Chest x-ray two-view done on admission which shows increased      interstitial markings with visual thickening suspicious for      pulmonary edema, volume overload, left upper lobe air space disease      and stable cardiomegaly.  2. Abdominal x-ray two-view which shows no bowel obstruction, no free      air.  3. Repeat chest x-ray done on February 17 which showed improved      aeration and mild pulmonary vascular congestion.   CHIEF COMPLAINT:  Fever and shortness of breath.   HISTORY OF PRESENT ILLNESS:  Please refer to the H and P by Dr.  Toniann Fail for details of the HPI.   HOSPITAL COURSE:  1. This is a patient who came in with significant findings on a chest      x-ray assessed in addition to her clinical presentation of fever      and shortness of breath.  The patient was also found to be hypoxic      on evaluation and had a WBC count elevated at 27.5.  There was a      suspicion for tuberculosis as part of the differential and the      patient was initially placed in respiratory isolation.  The patient      was also started on broad spectrum antibiotics including vancomycin      and Fortaz.   Infectious diseases was consulted to see the patient      and they felt that this was not tuberculosis on this patient and      discontinued isolation on the patient.  The patient was to be moved      up to nonisolation room out of the ICU and owing to the fact that      the patient did not like her room she left against medical advice.  2. End-stage renal disease.  While the patient was here in the      hospital she was scheduled for hemodialysis and      received it on January 18.  3. Hypertension.  The patient was continued on her clonidine 0.2 mg      t.i.d.  4. In terms of her diabetes it was well-controlled with her  insulin 20      units of Lantus q.h.s.      Altha Harm, MD  Electronically Signed     MAM/MEDQ  D:  11/24/2008  T:  11/24/2008  Job:  [id number]

## 2011-02-22 NOTE — Consult Note (Signed)
[name], RAFFO NO.:  [id number]   MEDICAL RECORD NO.:  [id number]          PATIENT TYPE:  WOC   LOCATION:  WOC                          FACILITY:  [facility]   PHYSICIAN:  John T. Kyla Balzarine, M.D.    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   HISTORY OF PRESENT ILLNESS:  The patient is currently pregnant again at  approximately [redacted] weeks gestation by dates. She was found to have a cyst with  thin septations measuring approximately 3 cm in greatest diameter on an  obstetrical ultrasound performed November 01, 2005. A repeat scan February 6  revealed persistence, without significant change. The left ovary was stated  to be normal on both with a tiny amount of free fluid in the cul-de-sac. A  single intrauterine gestation was noted on both scans, essentially  compatible with dates. Of note, the patient was explored in [place] in  December 2005 and underwent a left ovarian cystectomy, performed  laparoscopically. 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 The patient is  currently asymptomatic regards to pelvis, with no pain, alteration of bowel  or bladder function.   PAST MEDICAL HISTORY:  Significant for type R diabetes with retinopathy  currently on daily insulin. She was noted to have proteinuria during her  early pregnancy evaluation. She is status post laparoscopic ovarian  cystectomy as noted above.   MEDICATIONS:  Regular insulin q.a.m. and p.m., prenatal vitamins.   ALLERGIES:  None known.   PERSONAL AND SOCIAL HISTORY:  Married and denies tobacco or ethanol use.   FAMILY MEDICAL HISTORY:  No known breast, ovarian or colon  malignancies.   REVIEW OF SYSTEMS:  Other than noted above, comprehensive review of systems  negative.   PHYSICAL EXAMINATION:  VITAL SIGNS:  Weight 140 pounds, blood pressure  138/84, pulse 84, temperature afebrile.  GENERAL:  The patient is alert and oriented x3 in no acute distress. Lymph  survey reveals no pathologic lymphadenopathy. There is no back or CVA  tenderness. Abdomen is soft and benign with a nonpalpable uterus. No  ascites, mass or organomegaly. Trocar sites are without hernias.  EXTREMITIES:  Full strength in range of motion with no edema.  PELVIC:  External genitalia and BUS normal. Bladder and urethra are well-  supported and there are no vaginal mucosal lesions.  __________ physiologic  discharge present. Bimanual and rectovaginal examinations reveal 10-week  uterus. There is no adnexal tenderness or mass. No cul-de-sac nodularity.   LABS:  I reviewed the ultrasound scan reports as noted above.   ASSESSMENT:  Multicystic right adnexal mass in early pregnancy, in a patient  with prior history of contralateral LMP   PLAN:  I contacted Dr.  Pratt by telephone and she and I are in agreement  that no operative intervention is needed currently. I recommended a follow-  up ultrasound at approximately 16 weeks and if this cyst is not enlarging or  substantially changing, she should be followed with ultrasounds every 6-8  weeks during pregnancy. If it persists, she should have surgical evaluation  within approximately 8 weeks postpartum or via salpingo-oophorectomy at the  time of cesarean delivery. We would be glad to see the patient back for  follow-up if there are any questions regarding management.      John T. Kyla Balzarine, M.D.  Electronically Signed     JTS/MEDQ  D:  11/27/2005  T:  11/28/2005  Job:  [id number]   cc:   Shelbie Proctor. Shawnie Pons, M.D.   Telford Nab, R.N.  [address]  [place], [place] [address]

## 2011-02-22 NOTE — Discharge Summary (Signed)
[name], [name].:  [id number]   MEDICAL RECORD [name].:  [id number]          PATIENT TYPE:  INP   LOCATION:                                FACILITY:  [facility]   PHYSICIAN:  Alicia Carne, MD  DATE OF BIRTH:  1976/01/15   DATE OF ADMISSION:  DATE OF DISCHARGE:  05/25/2006                                 DISCHARGE SUMMARY   PRIMARY CARE PHYSICIAN:  Levander Campion, M.D.   HOSPITAL COURSE:  The patient is a 35 year old G1, P0 who presented at 62-  4/7 weeks with complaint of elevated blood pressure.  The patient has a  complicated medical history including poorly controlled type 2 diabetes with  end organ damage, specifically diabetic nephropathy.  She has chronic  hypertension and untreated tuberculosis.  The patient was diagnosed with  tuberculosis based on positive PPD in 2003 with incomplete therapy.  She was  seen by Dr. Luan [name] at the [facility] who ordered her to stop therapy  pending repeat chest x-rays.  This information was uncovered early in this  admission.  The patient was diagnosed with active TB based on positive AFB  cultures.   The patient was admitted for routine preeclampsia workup and was found to  have blood pressures consistently in the 140-150s over 80-90s.  The patient  has chronic proteinuria from her diabetes which complicated diagnosis of  severe preeclampsia.  Clinically, the patient was asymptomatic for signs and  symptoms of severe preeclampsia on admission.  Initially, the patient was  observed to have blood pressure spikes into the 170-180s over  90s to 100s.  MFM was consulted and review of blood  course indicated that the severe  range hypertensive spikes were intermittent and decision was made to monitor  the patient with intermittent electronic fetal monitoring and growth  ultrasounds until such times as the patient had clinical symptoms of severe  preeclampsia for sustained elevations in blood pressure.  The patient  remained hospitalized and was observed on a daily basis concurrent with  initiation of her RIE anti-TB therapy.  The patient persisted in this mild  hypertensive state without incident until May 19, 2006, with unremarkable  biweekly LFTs, reassuring electronic fetal monitoring and stable growth on  ultrasound.  After May 19, 2006, the patient was observed to have  sustained blood pressures in the 170s to 180s over 90s to 100s and at that  time was determined to have moved into severe preeclampsia based on blood  pressure criteria.  Decision was made to begin induction.  The patient was  placed on magnesium sulfate infusion, and Cytotec was placed.  Once  favorable, the patient was transitioned to Pitocin.  The patient continued  to progress on Pitocin induction and on May 22, 2006, the patient had  spontaneous rupture of membranes with subsequent cord prolapse and was taken  for emergent low transverse cesarean section.  The patient delivered a  viable infant who was taken to the NICU and placed on isolation with  implementation of isoniazid therapy.  Mom was transferred to the  adult ICU  after PACU for continued management.  Postoperative course was routine with  adequate diuresis documented and magnesium infusion discontinued.  On  postoperative day #3, the patient was preparing for discharge when she was  noted to have the blood pressure of 170/103 and decision was made to have a  second antihypertensive medication to her existing hydrochlorothiazide  regimen and to follow for one additional day to make sure that the patient  had not become severely hypertensive.  The patient was discharged home on  postoperative day #4 with stable sugars off insulin and on Glucotrol 5 mg  b.i.d.  Blood pressures were stable in the 140-150 over 80-90 range on  hydrochlorothiazide and metoprolol 25 mg b.i.d..  The patient was given one  day prescription coverage for her RIPE tuberculosis regimen  with follow-up  appointment with the outpatient TB Clinic the day after discharge on May 26, 2006.  The patient was also scheduled for followup with Dr. Levander Campion, [facility].  Birth control options were discussed with the patient and the patient was  advised to consider carefully any future pregnancies.  The patient agreed to  discuss birth control options with Dr. Luz Brazen and promised to maintain  pelvic rest in the interim period.   DISCHARGE MEDICATIONS:  1. Colace 100 mg q.d.  2. Darvocet A500 one tablet by mouth every 4 hours while wake as needed      for pain.  3. Ethambutol 400 mg three tablets by mouth at bedtime.  Please obtain      more medication at TB Clinic.  4. Glucotrol 5 mg one tablet by mouth twice a day as directed.  5. Hydrochlorothiazide 12.5 mg one capsule by mouth once a day.  6. Isoniazid 300 mg one tablet by mouth at bedtime.  Please obtain more      medication at the TB Clinic.  7. Metoprolol 25 mg one tablet b.i.d.  8. Pyridoxine 50 mg one tablet by mouth at bedtime.  9. Rifampin 300 mg two capsules by mouth at bedtime.  Please obtain more      medication from TB Clinic.  10.Tandem OB multivitamin with iron one capsule by mouth once a day.   DISCHARGE DIAGNOSES:  1. Low cervical transverse cesarean section.  2. Severe preeclampsia.  3. Diabetes mellitus, uncontrolled.  4. Benign hypertension.  5. Tuberculosis.  6. Diabetes diabetic nephropathy with proteinuria.   DISCHARGE INSTRUCTIONS:  Routine.   LABORATORY DATA:  Discharge hemoglobin 8.9 on May 25, 2006.  The patient  is AB positive, antibody negative, rubella immune.      [name], M.D.      Alicia Carne, MD  Electronically Signed    JP/MEDQ  D:  05/25/2006  T:  05/25/2006  Job:  [id number]   cc:   Levander Campion, M.D.

## 2011-02-26 ENCOUNTER — Other Ambulatory Visit: Payer: Self-pay | Admitting: Nephrology

## 2011-02-26 ENCOUNTER — Encounter (HOSPITAL_COMMUNITY): Payer: BC Managed Care – PPO

## 2011-03-05 ENCOUNTER — Encounter (HOSPITAL_COMMUNITY): Payer: Self-pay

## 2011-03-06 ENCOUNTER — Other Ambulatory Visit: Payer: Self-pay | Admitting: Nephrology

## 2011-03-06 ENCOUNTER — Encounter (HOSPITAL_COMMUNITY): Payer: BC Managed Care – PPO

## 2011-03-12 ENCOUNTER — Encounter (HOSPITAL_COMMUNITY): Payer: BC Managed Care – PPO | Attending: Nephrology

## 2011-03-12 ENCOUNTER — Other Ambulatory Visit: Payer: Self-pay | Admitting: Nephrology

## 2011-03-12 DIAGNOSIS — D638 Anemia in other chronic diseases classified elsewhere: Secondary | ICD-10-CM | POA: Insufficient documentation

## 2011-03-12 DIAGNOSIS — N183 Chronic kidney disease, stage 3 unspecified: Secondary | ICD-10-CM | POA: Insufficient documentation

## 2011-03-12 LAB — IRON AND TIBC
Iron: 87 ug/dL (ref 42–135)
Saturation Ratios: 34 % (ref 20–55)
TIBC: 257 ug/dL (ref 250–470)

## 2011-03-13 LAB — FERRITIN: Ferritin: 1539 ng/mL — ABNORMAL HIGH (ref 10–291)

## 2011-03-13 LAB — POCT HEMOGLOBIN-HEMACUE: Hemoglobin: 9.6 g/dL — ABNORMAL LOW (ref 12.0–15.0)

## 2011-03-19 ENCOUNTER — Encounter (HOSPITAL_COMMUNITY): Payer: BC Managed Care – PPO

## 2011-03-19 ENCOUNTER — Other Ambulatory Visit: Payer: Self-pay | Admitting: Nephrology

## 2011-03-26 ENCOUNTER — Encounter (HOSPITAL_COMMUNITY): Payer: BC Managed Care – PPO

## 2011-03-26 ENCOUNTER — Other Ambulatory Visit: Payer: Self-pay | Admitting: Nephrology

## 2011-04-02 ENCOUNTER — Other Ambulatory Visit: Payer: Self-pay | Admitting: Nephrology

## 2011-04-02 ENCOUNTER — Encounter (HOSPITAL_COMMUNITY): Payer: BC Managed Care – PPO

## 2011-04-09 ENCOUNTER — Other Ambulatory Visit: Payer: Self-pay | Admitting: Orthopedic Surgery

## 2011-04-09 ENCOUNTER — Encounter (HOSPITAL_COMMUNITY): Payer: Medicare Other | Attending: Orthopedic Surgery

## 2011-04-09 DIAGNOSIS — D638 Anemia in other chronic diseases classified elsewhere: Secondary | ICD-10-CM | POA: Insufficient documentation

## 2011-04-09 DIAGNOSIS — N183 Chronic kidney disease, stage 3 unspecified: Secondary | ICD-10-CM | POA: Insufficient documentation

## 2011-04-09 LAB — FERRITIN: Ferritin: 980 ng/mL — ABNORMAL HIGH (ref 10–291)

## 2011-04-09 LAB — POCT HEMOGLOBIN-HEMACUE: Hemoglobin: 11.6 g/dL — ABNORMAL LOW (ref 12.0–15.0)

## 2011-04-09 LAB — IRON AND TIBC
Iron: 97 ug/dL (ref 42–135)
UIBC: 141 ug/dL

## 2011-04-16 ENCOUNTER — Encounter (HOSPITAL_COMMUNITY): Payer: Medicare Other

## 2011-04-16 ENCOUNTER — Other Ambulatory Visit: Payer: Self-pay | Admitting: Nephrology

## 2011-04-30 ENCOUNTER — Encounter (HOSPITAL_COMMUNITY): Payer: BLUE CROSS/BLUE SHIELD

## 2011-05-07 ENCOUNTER — Other Ambulatory Visit: Payer: Self-pay | Admitting: Nephrology

## 2011-05-07 ENCOUNTER — Encounter (HOSPITAL_COMMUNITY): Payer: Medicare Other

## 2011-05-07 LAB — IRON AND TIBC
Iron: 80 ug/dL (ref 42–135)
Saturation Ratios: 33 % (ref 20–55)
TIBC: 239 ug/dL — ABNORMAL LOW (ref 250–470)
UIBC: 159 ug/dL

## 2011-05-07 LAB — FERRITIN: Ferritin: 962 ng/mL — ABNORMAL HIGH (ref 10–291)

## 2011-05-14 ENCOUNTER — Other Ambulatory Visit: Payer: Self-pay | Admitting: Nephrology

## 2011-05-14 ENCOUNTER — Encounter (HOSPITAL_COMMUNITY): Payer: Medicare Other | Attending: Nephrology

## 2011-05-14 DIAGNOSIS — D638 Anemia in other chronic diseases classified elsewhere: Secondary | ICD-10-CM | POA: Insufficient documentation

## 2011-05-14 DIAGNOSIS — N183 Chronic kidney disease, stage 3 unspecified: Secondary | ICD-10-CM | POA: Insufficient documentation

## 2011-05-14 LAB — POCT HEMOGLOBIN-HEMACUE: Hemoglobin: 10.6 g/dL — ABNORMAL LOW (ref 12.0–15.0)

## 2011-05-23 ENCOUNTER — Encounter (HOSPITAL_COMMUNITY): Payer: Medicare Other

## 2011-05-23 ENCOUNTER — Other Ambulatory Visit: Payer: Self-pay | Admitting: Nephrology

## 2011-05-30 ENCOUNTER — Other Ambulatory Visit: Payer: Self-pay | Admitting: Nephrology

## 2011-05-30 ENCOUNTER — Encounter (HOSPITAL_COMMUNITY): Payer: Medicare Other

## 2011-06-13 ENCOUNTER — Other Ambulatory Visit: Payer: Self-pay | Admitting: Nephrology

## 2011-06-13 ENCOUNTER — Encounter (HOSPITAL_COMMUNITY): Payer: BC Managed Care – PPO | Attending: Nephrology

## 2011-06-13 DIAGNOSIS — D638 Anemia in other chronic diseases classified elsewhere: Secondary | ICD-10-CM | POA: Insufficient documentation

## 2011-06-13 DIAGNOSIS — N183 Chronic kidney disease, stage 3 unspecified: Secondary | ICD-10-CM | POA: Insufficient documentation

## 2011-06-13 LAB — POCT HEMOGLOBIN-HEMACUE: Hemoglobin: 12.7 g/dL (ref 12.0–15.0)

## 2011-06-13 LAB — IRON AND TIBC
Saturation Ratios: 33 % (ref 20–55)
UIBC: 179 ug/dL (ref 125–400)

## 2011-06-13 LAB — FERRITIN: Ferritin: 772 ng/mL — ABNORMAL HIGH (ref 10–291)

## 2011-06-27 LAB — DIFFERENTIAL
Basophils Relative: 0
Lymphocytes Relative: 32
Monocytes Absolute: 0.4
Monocytes Relative: 5
Neutro Abs: 5.1
Neutrophils Relative %: 62

## 2011-06-27 LAB — CBC
Hemoglobin: 10.6 — ABNORMAL LOW
RBC: 3.5 — ABNORMAL LOW
WBC: 8.4

## 2011-06-28 LAB — POCT HEMOGLOBIN-HEMACUE: Hemoglobin: 11.2 — ABNORMAL LOW

## 2011-07-01 LAB — POCT HEMOGLOBIN-HEMACUE
Hemoglobin: 10.8 — ABNORMAL LOW
Operator id: 274481

## 2011-07-03 LAB — IRON AND TIBC
Iron: 69
Saturation Ratios: 28
TIBC: 247 — ABNORMAL LOW
UIBC: 178

## 2011-07-03 LAB — CBC
Hemoglobin: 8.5 — ABNORMAL LOW
MCHC: 35.4
RBC: 2.84 — ABNORMAL LOW
RDW: 14.6

## 2011-07-03 LAB — FERRITIN: Ferritin: 72 (ref 10–291)

## 2011-07-04 LAB — BASIC METABOLIC PANEL
BUN: 84 — ABNORMAL HIGH
CO2: 21
Calcium: 8.8
Creatinine, Ser: 9.79 — ABNORMAL HIGH
GFR calc non Af Amer: 5 — ABNORMAL LOW
Glucose, Bld: 140 — ABNORMAL HIGH
Sodium: 136

## 2011-07-04 LAB — CBC
Hemoglobin: 9.1 — ABNORMAL LOW
MCHC: 35.9
Platelets: 309
RDW: 15.3

## 2011-07-04 LAB — RENAL FUNCTION PANEL
Albumin: 2.1 — ABNORMAL LOW
BUN: 78 — ABNORMAL HIGH
Calcium: 8.2 — ABNORMAL LOW
Creatinine, Ser: 8.34 — ABNORMAL HIGH
Glucose, Bld: 178 — ABNORMAL HIGH
Phosphorus: 7.5 — ABNORMAL HIGH
Potassium: 4.5

## 2011-07-05 ENCOUNTER — Other Ambulatory Visit: Payer: Self-pay | Admitting: Nephrology

## 2011-07-05 ENCOUNTER — Encounter (HOSPITAL_COMMUNITY)
Admission: RE | Admit: 2011-07-05 | Discharge: 2011-07-05 | Payer: BC Managed Care – PPO | Source: Ambulatory Visit | Attending: Nephrology | Admitting: Nephrology

## 2011-07-08 LAB — GLUCOSE, CAPILLARY
Glucose-Capillary: 117 — ABNORMAL HIGH
Glucose-Capillary: 119 — ABNORMAL HIGH
Glucose-Capillary: 121 — ABNORMAL HIGH
Glucose-Capillary: 128 — ABNORMAL HIGH
Glucose-Capillary: 196 — ABNORMAL HIGH
Glucose-Capillary: 311 — ABNORMAL HIGH
Glucose-Capillary: 92

## 2011-07-08 LAB — POCT I-STAT 4, (NA,K, GLUC, HGB,HCT)
Hemoglobin: 13.3
Potassium: 4
Sodium: 134 — ABNORMAL LOW

## 2011-07-08 LAB — PROTIME-INR
INR: 0.9
Prothrombin Time: 12.6

## 2011-07-08 LAB — CULTURE, ROUTINE-ABSCESS

## 2011-07-08 LAB — BASIC METABOLIC PANEL
Chloride: 99
GFR calc non Af Amer: 8 — ABNORMAL LOW
Potassium: 4.1
Sodium: 135

## 2011-07-08 LAB — CBC
HCT: 26.8 — ABNORMAL LOW
Hemoglobin: 8.9 — ABNORMAL LOW
MCHC: 32.7
MCV: 83.2
Platelets: 360
RBC: 3.22 — ABNORMAL LOW
WBC: 12.5 — ABNORMAL HIGH
WBC: 9.2

## 2011-07-08 LAB — CATH TIP CULTURE: Culture: NO GROWTH

## 2011-07-11 ENCOUNTER — Other Ambulatory Visit: Payer: Self-pay | Admitting: Nephrology

## 2011-07-11 ENCOUNTER — Encounter (HOSPITAL_COMMUNITY): Payer: Medicare Other | Attending: Nephrology

## 2011-07-11 DIAGNOSIS — D638 Anemia in other chronic diseases classified elsewhere: Secondary | ICD-10-CM | POA: Insufficient documentation

## 2011-07-11 DIAGNOSIS — N183 Chronic kidney disease, stage 3 unspecified: Secondary | ICD-10-CM | POA: Insufficient documentation

## 2011-07-11 LAB — IRON AND TIBC: UIBC: 189 ug/dL (ref 125–400)

## 2011-07-12 LAB — POCT I-STAT 4, (NA,K, GLUC, HGB,HCT)
Glucose, Bld: 142 mg/dL — ABNORMAL HIGH (ref 70–99)
HCT: 32 % — ABNORMAL LOW (ref 36.0–46.0)
Hemoglobin: 10.9 g/dL — ABNORMAL LOW (ref 12.0–15.0)
Potassium: 5 mEq/L (ref 3.5–5.1)
Sodium: 135 mEq/L (ref 135–145)

## 2011-07-15 LAB — CBC
HCT: 31.8 — ABNORMAL LOW
Hemoglobin: 10.9 — ABNORMAL LOW
MCHC: 34.1
RBC: 3.67 — ABNORMAL LOW
RDW: 14

## 2011-07-15 LAB — IRON AND TIBC: Saturation Ratios: 20

## 2011-07-15 LAB — FERRITIN: Ferritin: 30 (ref 10–291)

## 2011-07-19 ENCOUNTER — Encounter (HOSPITAL_COMMUNITY): Payer: Medicare Other

## 2011-07-19 ENCOUNTER — Other Ambulatory Visit: Payer: Self-pay | Admitting: Nephrology

## 2011-07-19 LAB — POCT HEMOGLOBIN-HEMACUE: Hemoglobin: 11.1 g/dL — ABNORMAL LOW (ref 12.0–15.0)

## 2011-07-26 ENCOUNTER — Encounter (HOSPITAL_COMMUNITY): Payer: BC Managed Care – PPO

## 2011-08-05 ENCOUNTER — Other Ambulatory Visit: Payer: Self-pay | Admitting: Registered Nurse

## 2011-08-05 ENCOUNTER — Other Ambulatory Visit (HOSPITAL_COMMUNITY)
Admission: RE | Admit: 2011-08-05 | Discharge: 2011-08-05 | Disposition: A | Discharge status: Home / Self Care | Payer: Medicare Other | Source: Ambulatory Visit | Attending: Internal Medicine | Admitting: Internal Medicine

## 2011-08-05 DIAGNOSIS — Z01419 Encounter for gynecological examination (general) (routine) without abnormal findings: Secondary | ICD-10-CM | POA: Insufficient documentation

## 2011-08-06 ENCOUNTER — Other Ambulatory Visit: Payer: Self-pay | Admitting: Nephrology

## 2011-08-06 ENCOUNTER — Encounter (HOSPITAL_COMMUNITY): Payer: Medicare Other

## 2011-08-06 LAB — POCT HEMOGLOBIN-HEMACUE: Hemoglobin: 11.4 g/dL — ABNORMAL LOW (ref 12.0–15.0)

## 2011-08-09 ENCOUNTER — Other Ambulatory Visit (HOSPITAL_COMMUNITY): Payer: Self-pay | Admitting: *Deleted

## 2011-08-13 ENCOUNTER — Encounter (HOSPITAL_COMMUNITY): Payer: Medicare Other

## 2011-08-27 ENCOUNTER — Encounter (HOSPITAL_COMMUNITY): Payer: Medicare Other

## 2011-09-10 ENCOUNTER — Encounter (HOSPITAL_COMMUNITY)
Admission: RE | Admit: 2011-09-10 | Discharge: 2011-09-10 | Disposition: A | Discharge status: Home / Self Care | Payer: Medicare Other | Source: Ambulatory Visit | Attending: Nephrology | Admitting: Nephrology

## 2011-09-10 DIAGNOSIS — N183 Chronic kidney disease, stage 3 unspecified: Secondary | ICD-10-CM | POA: Insufficient documentation

## 2011-09-10 DIAGNOSIS — D638 Anemia in other chronic diseases classified elsewhere: Secondary | ICD-10-CM | POA: Insufficient documentation

## 2011-09-10 LAB — FERRITIN: Ferritin: 651 ng/mL — ABNORMAL HIGH (ref 10–291)

## 2011-09-10 LAB — IRON AND TIBC
Iron: 75 ug/dL (ref 42–135)
UIBC: 157 ug/dL (ref 125–400)

## 2011-09-10 MED ORDER — DARBEPOETIN ALFA-POLYSORBATE 500 MCG/ML IJ SOLN: 100.0000 ug | INTRAMUSCULAR | Status: DC

## 2011-09-10 MED ORDER — DARBEPOETIN ALFA-POLYSORBATE 100 MCG/0.5ML IJ SOLN
INTRAMUSCULAR | Status: AC
  Administered 2011-09-10: 100 ug via SUBCUTANEOUS
  Filled 2011-09-10: qty 0.5

## 2011-09-18 ENCOUNTER — Encounter (HOSPITAL_COMMUNITY)
Admission: RE | Admit: 2011-09-18 | Discharge: 2011-09-18 | Disposition: A | Discharge status: Home / Self Care | Payer: Medicare Other | Source: Ambulatory Visit | Attending: Nephrology | Admitting: Nephrology

## 2011-09-18 MED ORDER — DARBEPOETIN ALFA-POLYSORBATE 500 MCG/ML IJ SOLN
100.0000 ug | INTRAMUSCULAR | Status: DC
  Filled 2011-09-18: qty 1

## 2011-09-18 MED ORDER — DARBEPOETIN ALFA-POLYSORBATE 100 MCG/0.5ML IJ SOLN
INTRAMUSCULAR | Status: AC
  Administered 2011-09-18: 15:00:00 via SUBCUTANEOUS
  Filled 2011-09-18: qty 0.5

## 2011-09-25 ENCOUNTER — Encounter (HOSPITAL_COMMUNITY)
Admission: RE | Admit: 2011-09-25 | Discharge: 2011-09-25 | Disposition: A | Discharge status: Home / Self Care | Payer: Medicare Other | Source: Ambulatory Visit | Attending: Nephrology | Admitting: Nephrology

## 2011-09-25 LAB — POCT HEMOGLOBIN-HEMACUE: Hemoglobin: 9.5 g/dL — ABNORMAL LOW (ref 12.0–15.0)

## 2011-09-25 MED ORDER — DARBEPOETIN ALFA-POLYSORBATE 100 MCG/0.5ML IJ SOLN
INTRAMUSCULAR | Status: AC
  Administered 2011-09-25: 100 ug via SUBCUTANEOUS
  Filled 2011-09-25: qty 0.5

## 2011-09-25 MED ORDER — DARBEPOETIN ALFA-POLYSORBATE 500 MCG/ML IJ SOLN
100.0000 ug | INTRAMUSCULAR | Status: DC
  Filled 2011-09-25: qty 1

## 2011-10-02 ENCOUNTER — Encounter (HOSPITAL_COMMUNITY): Payer: Medicare Other

## 2011-10-09 ENCOUNTER — Encounter (HOSPITAL_COMMUNITY)
Admission: RE | Admit: 2011-10-09 | Discharge: 2011-10-09 | Disposition: A | Discharge status: Home / Self Care | Payer: BC Managed Care – PPO | Source: Ambulatory Visit | Attending: Nephrology | Admitting: Nephrology

## 2011-10-09 DIAGNOSIS — N183 Chronic kidney disease, stage 3 unspecified: Secondary | ICD-10-CM | POA: Insufficient documentation

## 2011-10-09 DIAGNOSIS — D638 Anemia in other chronic diseases classified elsewhere: Secondary | ICD-10-CM | POA: Insufficient documentation

## 2011-10-09 LAB — IRON AND TIBC
Iron: 50 ug/dL (ref 42–135)
TIBC: 239 ug/dL — ABNORMAL LOW (ref 250–470)
UIBC: 189 ug/dL (ref 125–400)

## 2011-10-09 LAB — FERRITIN: Ferritin: 680 ng/mL — ABNORMAL HIGH (ref 10–291)

## 2011-10-09 MED ORDER — DARBEPOETIN ALFA-POLYSORBATE 100 MCG/0.5ML IJ SOLN
INTRAMUSCULAR | Status: AC
  Filled 2011-10-09: qty 0.5

## 2011-10-09 MED ORDER — DARBEPOETIN ALFA-POLYSORBATE 500 MCG/ML IJ SOLN
100.0000 ug | INTRAMUSCULAR | Status: DC
  Administered 2011-10-09: 100 ug via SUBCUTANEOUS
  Filled 2011-10-09: qty 1

## 2011-10-10 MED FILL — Darbepoetin Alfa-Polysorbate 80 Soln Inj 100 MCG/0.5ML: INTRAMUSCULAR | Qty: 0.5 | Status: AC

## 2011-10-11 ENCOUNTER — Other Ambulatory Visit (HOSPITAL_COMMUNITY): Payer: Self-pay | Admitting: *Deleted

## 2011-10-16 ENCOUNTER — Encounter (HOSPITAL_COMMUNITY)
Admission: RE | Admit: 2011-10-16 | Discharge: 2011-10-16 | Disposition: A | Discharge status: Home / Self Care | Payer: BC Managed Care – PPO | Source: Ambulatory Visit | Attending: Nephrology | Admitting: Nephrology

## 2011-10-16 MED ORDER — DARBEPOETIN ALFA-POLYSORBATE 100 MCG/0.5ML IJ SOLN
INTRAMUSCULAR | Status: AC
  Filled 2011-10-16: qty 0.5

## 2011-10-16 MED ORDER — DARBEPOETIN ALFA-POLYSORBATE 100 MCG/0.5ML IJ SOLN
100.0000 ug | INTRAMUSCULAR | Status: DC
  Administered 2011-10-16: 100 ug via SUBCUTANEOUS

## 2011-10-23 ENCOUNTER — Encounter (HOSPITAL_COMMUNITY): Admission: RE | Admit: 2011-10-23 | Payer: BC Managed Care – PPO | Source: Ambulatory Visit

## 2011-10-25 ENCOUNTER — Encounter (HOSPITAL_COMMUNITY): Payer: Medicare Other

## 2011-10-30 ENCOUNTER — Encounter (HOSPITAL_COMMUNITY)
Admission: RE | Admit: 2011-10-30 | Discharge: 2011-10-30 | Disposition: A | Discharge status: Home / Self Care | Payer: BC Managed Care – PPO | Source: Ambulatory Visit | Attending: Nephrology | Admitting: Nephrology

## 2011-10-30 MED ORDER — DARBEPOETIN ALFA-POLYSORBATE 100 MCG/0.5ML IJ SOLN
INTRAMUSCULAR | Status: AC
  Filled 2011-10-30: qty 0.5

## 2011-10-30 MED ORDER — DARBEPOETIN ALFA-POLYSORBATE 100 MCG/0.5ML IJ SOLN
100.0000 ug | INTRAMUSCULAR | Status: DC
  Administered 2011-10-30: 100 ug via SUBCUTANEOUS

## 2011-11-06 ENCOUNTER — Encounter (HOSPITAL_COMMUNITY): Payer: BC Managed Care – PPO

## 2011-11-07 ENCOUNTER — Encounter (HOSPITAL_COMMUNITY): Payer: Medicare Other | Attending: Nephrology

## 2011-11-07 DIAGNOSIS — D638 Anemia in other chronic diseases classified elsewhere: Secondary | ICD-10-CM | POA: Insufficient documentation

## 2011-11-07 DIAGNOSIS — N183 Chronic kidney disease, stage 3 unspecified: Secondary | ICD-10-CM | POA: Insufficient documentation

## 2011-11-13 ENCOUNTER — Encounter (HOSPITAL_COMMUNITY): Payer: Medicare Other

## 2011-11-14 ENCOUNTER — Encounter (HOSPITAL_COMMUNITY)
Admission: RE | Admit: 2011-11-14 | Discharge: 2011-11-14 | Disposition: A | Discharge status: Home / Self Care | Payer: Medicare Other | Source: Ambulatory Visit | Attending: Nephrology | Admitting: Nephrology

## 2011-11-14 DIAGNOSIS — N183 Chronic kidney disease, stage 3 unspecified: Secondary | ICD-10-CM | POA: Insufficient documentation

## 2011-11-14 DIAGNOSIS — D638 Anemia in other chronic diseases classified elsewhere: Secondary | ICD-10-CM | POA: Insufficient documentation

## 2011-11-14 LAB — FERRITIN: Ferritin: 330 ng/mL — ABNORMAL HIGH (ref 10–291)

## 2011-11-14 LAB — POCT HEMOGLOBIN-HEMACUE: Hemoglobin: 8.8 g/dL — ABNORMAL LOW (ref 12.0–15.0)

## 2011-11-14 LAB — IRON AND TIBC
Iron: 64 ug/dL (ref 42–135)
UIBC: 149 ug/dL (ref 125–400)

## 2011-11-14 MED ORDER — DARBEPOETIN ALFA-POLYSORBATE 100 MCG/0.5ML IJ SOLN
100.0000 ug | INTRAMUSCULAR | Status: DC
  Administered 2011-11-14: 100 ug via SUBCUTANEOUS

## 2011-11-14 MED ORDER — DARBEPOETIN ALFA-POLYSORBATE 100 MCG/0.5ML IJ SOLN
INTRAMUSCULAR | Status: AC
  Filled 2011-11-14: qty 0.5

## 2011-11-21 ENCOUNTER — Encounter (HOSPITAL_COMMUNITY): Payer: Medicare Other

## 2011-11-26 ENCOUNTER — Other Ambulatory Visit (HOSPITAL_COMMUNITY): Payer: Self-pay | Admitting: *Deleted

## 2011-11-27 ENCOUNTER — Other Ambulatory Visit (HOSPITAL_COMMUNITY): Payer: Self-pay | Admitting: *Deleted

## 2011-11-28 ENCOUNTER — Encounter (HOSPITAL_COMMUNITY): Payer: Medicare Other

## 2011-12-05 ENCOUNTER — Encounter (HOSPITAL_COMMUNITY)
Admission: RE | Admit: 2011-12-05 | Discharge: 2011-12-05 | Disposition: A | Discharge status: Home / Self Care | Payer: Medicare Other | Source: Ambulatory Visit | Attending: Nephrology | Admitting: Nephrology

## 2011-12-05 LAB — IRON AND TIBC
Iron: 92 ug/dL (ref 42–135)
Saturation Ratios: 40 % (ref 20–55)
UIBC: 138 ug/dL (ref 125–400)

## 2011-12-05 LAB — FERRITIN: Ferritin: 416 ng/mL — ABNORMAL HIGH (ref 10–291)

## 2011-12-05 MED ORDER — DARBEPOETIN ALFA-POLYSORBATE 100 MCG/0.5ML IJ SOLN
100.0000 ug | INTRAMUSCULAR | Status: DC
  Administered 2011-12-05: 100 ug via SUBCUTANEOUS
  Filled 2011-12-05: qty 0.5

## 2011-12-12 ENCOUNTER — Encounter (HOSPITAL_COMMUNITY): Payer: Medicare Other | Attending: Nephrology

## 2011-12-12 DIAGNOSIS — D638 Anemia in other chronic diseases classified elsewhere: Secondary | ICD-10-CM | POA: Insufficient documentation

## 2011-12-12 DIAGNOSIS — N183 Chronic kidney disease, stage 3 unspecified: Secondary | ICD-10-CM | POA: Insufficient documentation

## 2011-12-19 ENCOUNTER — Encounter (HOSPITAL_COMMUNITY): Payer: Medicare Other

## 2011-12-27 ENCOUNTER — Encounter (HOSPITAL_COMMUNITY): Payer: BC Managed Care – PPO

## 2011-12-31 ENCOUNTER — Inpatient Hospital Stay (HOSPITAL_COMMUNITY): Admission: RE | Admit: 2011-12-31 | Payer: BC Managed Care – PPO | Source: Ambulatory Visit

## 2012-01-09 ENCOUNTER — Encounter (HOSPITAL_COMMUNITY): Payer: BC Managed Care – PPO

## 2012-01-14 ENCOUNTER — Encounter (HOSPITAL_COMMUNITY)
Admission: RE | Admit: 2012-01-14 | Discharge: 2012-01-14 | Disposition: A | Discharge status: Home / Self Care | Payer: Medicare Other | Source: Ambulatory Visit | Attending: Nephrology | Admitting: Nephrology

## 2012-01-14 DIAGNOSIS — N183 Chronic kidney disease, stage 3 unspecified: Secondary | ICD-10-CM | POA: Insufficient documentation

## 2012-01-14 DIAGNOSIS — D638 Anemia in other chronic diseases classified elsewhere: Secondary | ICD-10-CM | POA: Insufficient documentation

## 2012-01-14 LAB — IRON AND TIBC
Iron: 94 ug/dL (ref 42–135)
TIBC: 258 ug/dL (ref 250–470)

## 2012-01-14 LAB — FERRITIN: Ferritin: 507 ng/mL — ABNORMAL HIGH (ref 10–291)

## 2012-01-14 LAB — POCT HEMOGLOBIN-HEMACUE: Hemoglobin: 7.6 g/dL — ABNORMAL LOW (ref 12.0–15.0)

## 2012-01-14 MED ORDER — DARBEPOETIN ALFA-POLYSORBATE 100 MCG/0.5ML IJ SOLN
INTRAMUSCULAR | Status: AC
  Filled 2012-01-14: qty 0.5

## 2012-01-14 MED ORDER — DARBEPOETIN ALFA-POLYSORBATE 100 MCG/0.5ML IJ SOLN
100.0000 ug | INTRAMUSCULAR | Status: DC
  Administered 2012-01-14: 100 ug via SUBCUTANEOUS

## 2012-01-14 NOTE — Progress Notes (Signed)
Spoke with dr Hyman Hopes and told him pt here today with hemocue of 7.6.  Also reported to him that she is non-compliant and has not seen [place] since 12/05/11.  No new orders given.  Continued with current treatment plan.

## 2012-01-21 ENCOUNTER — Emergency Department (HOSPITAL_COMMUNITY)
Admission: EM | Admit: 2012-01-21 | Discharge: 2012-01-21 | Disposition: A | Discharge status: Home / Self Care | Payer: BC Managed Care – PPO | Attending: Emergency Medicine | Admitting: Emergency Medicine

## 2012-01-21 ENCOUNTER — Encounter (HOSPITAL_COMMUNITY): Payer: Self-pay

## 2012-01-21 ENCOUNTER — Encounter (HOSPITAL_COMMUNITY)
Admission: RE | Admit: 2012-01-21 | Discharge: 2012-01-21 | Disposition: A | Discharge status: Home / Self Care | Payer: Medicare Other | Source: Ambulatory Visit | Attending: Nephrology | Admitting: Nephrology

## 2012-01-21 DIAGNOSIS — E785 Hyperlipidemia, unspecified: Secondary | ICD-10-CM | POA: Insufficient documentation

## 2012-01-21 DIAGNOSIS — D649 Anemia, unspecified: Secondary | ICD-10-CM | POA: Insufficient documentation

## 2012-01-21 DIAGNOSIS — Z794 Long term (current) use of insulin: Secondary | ICD-10-CM | POA: Insufficient documentation

## 2012-01-21 DIAGNOSIS — Z79899 Other long term (current) drug therapy: Secondary | ICD-10-CM | POA: Insufficient documentation

## 2012-01-21 DIAGNOSIS — I120 Hypertensive chronic kidney disease with stage 5 chronic kidney disease or end stage renal disease: Secondary | ICD-10-CM | POA: Insufficient documentation

## 2012-01-21 DIAGNOSIS — N189 Chronic kidney disease, unspecified: Secondary | ICD-10-CM

## 2012-01-21 DIAGNOSIS — Z992 Dependence on renal dialysis: Secondary | ICD-10-CM | POA: Insufficient documentation

## 2012-01-21 DIAGNOSIS — D631 Anemia in chronic kidney disease: Secondary | ICD-10-CM

## 2012-01-21 DIAGNOSIS — Z940 Kidney transplant status: Secondary | ICD-10-CM

## 2012-01-21 DIAGNOSIS — N186 End stage renal disease: Secondary | ICD-10-CM | POA: Insufficient documentation

## 2012-01-21 DIAGNOSIS — E119 Type 2 diabetes mellitus without complications: Secondary | ICD-10-CM

## 2012-01-21 DIAGNOSIS — R5381 Other malaise: Secondary | ICD-10-CM | POA: Insufficient documentation

## 2012-01-21 HISTORY — DX: Essential (primary) hypertension: I10

## 2012-01-21 HISTORY — DX: Pneumonia, unspecified organism: J18.9

## 2012-01-21 HISTORY — DX: Malignant neoplasm of unspecified ovary: C56.9

## 2012-01-21 HISTORY — DX: Tuberculosis of lung: A15.0

## 2012-01-21 LAB — BASIC METABOLIC PANEL
BUN: 36 mg/dL — ABNORMAL HIGH (ref 6–23)
CO2: 23 mEq/L (ref 19–32)
Chloride: 101 mEq/L (ref 96–112)
Creatinine, Ser: 3.27 mg/dL — ABNORMAL HIGH (ref 0.50–1.10)
GFR calc Af Amer: 20 mL/min — ABNORMAL LOW (ref >90)
Glucose, Bld: 124 mg/dL — ABNORMAL HIGH (ref 70–99)
Potassium: 3.8 mEq/L (ref 3.5–5.1)

## 2012-01-21 LAB — DIFFERENTIAL
Basophils Relative: 0 % (ref 0–1)
Lymphocytes Relative: 9 % — ABNORMAL LOW (ref 12–46)
Lymphs Abs: 0.8 10*3/uL (ref 0.7–4.0)
Monocytes Absolute: 0.2 10*3/uL (ref 0.1–1.0)
Monocytes Relative: 3 % (ref 3–12)
Neutro Abs: 7.5 10*3/uL (ref 1.7–7.7)
Neutrophils Relative %: 87 % — ABNORMAL HIGH (ref 43–77)

## 2012-01-21 LAB — ABO/RH: ABO/RH(D): AB POS

## 2012-01-21 LAB — CBC
HCT: 20 % — ABNORMAL LOW (ref 36.0–46.0)
Hemoglobin: 6.8 g/dL — CL (ref 12.0–15.0)
MCHC: 34 g/dL (ref 30.0–36.0)
RBC: 2.42 MIL/uL — ABNORMAL LOW (ref 3.87–5.11)

## 2012-01-21 MED ORDER — DARBEPOETIN ALFA-POLYSORBATE 100 MCG/0.5ML IJ SOLN
100.0000 ug | INTRAMUSCULAR | Status: DC
  Administered 2012-01-21: 100 ug via SUBCUTANEOUS

## 2012-01-21 MED ORDER — DARBEPOETIN ALFA-POLYSORBATE 100 MCG/0.5ML IJ SOLN
INTRAMUSCULAR | Status: AC
  Filled 2012-01-21: qty 0.5

## 2012-01-21 NOTE — ED Notes (Signed)
Patient unable to void at this time

## 2012-01-21 NOTE — Discharge Instructions (Signed)
Hemoglobin was 6.8.    You'll need a transfusion.  Recommend that you go to [facility] for same.  You understand that you're leaving AGAINST MEDICAL ADVICE. It is my recommendation you she stay at [facility] and receive your transfusion.

## 2012-01-21 NOTE — ED Notes (Signed)
hospitalist at bedside

## 2012-01-21 NOTE — Progress Notes (Signed)
Hemoglobin phoned to [name], CMA  For Dr Hyman Hopes.  She states she advised MD of result.  Dr Hyman Hopes instructed to medicate patient per order and advise her to go to the ED for possible blood transfusion.  I instructed patient to go to ED and she voiced understanding and she stated she would go directly there.

## 2012-01-21 NOTE — ED Notes (Signed)
Pt does not want to stay here, she wants to go to [facility] where her surgery was done to receive treatment. It was understood that her husband needed to come here and take her to the hospital but the patient is asypmtomatic and says that he cannot because he is picking up their son and meeting her at [facility]. Pt denies any pain, dizziness, or other problems at this time. Dr. Adriana Simas made aware and is agreed to let her go.

## 2012-01-21 NOTE — ED Notes (Signed)
Pt is renal paitent and has had a transplant, pt sent here from short stay form hgb of 6 and was given her procrit today.

## 2012-01-21 NOTE — ED Notes (Signed)
Asked patient for urine sample.  Patient states she is unable to void at this time.

## 2012-01-21 NOTE — ED Provider Notes (Addendum)
History     CSN: [id number]  Arrival date & time 01/21/12  1032   First MD Initiated Contact with Patient 01/21/12 1040      Chief Complaint  Patient presents with  . IV Medication    (Consider location/radiation/quality/duration/timing/severity/associated sxs/prior treatment) HPI... chief complaint anemia.  Patient is status post renal transplant in 2010 over a portion of Versed. She presented to [facility] today for an injection of her Procrit medication. Hemoglobin at that time was noted below (less than 7).  She feels slightly weak but no chest pain or shortness of breath. Hemoglobin has been dropping.  Nothing makes her feel better or worse. Symptoms are minimal to moderate.  Past Medical History  Diagnosis Date  . ESRD (end stage renal disease)     history of peritoneal and Hemodialysis, now s/p renal  transplant  . Renal transplant disorder 2010  . Ovarian tumor, malignant 2005    resected in [place]  . Diabetic retinopathy   . HTN (hypertension)     not well controlled  . 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 HLD (hyperlipidemia)   . CAP (community acquired pneumonia) 2010    Past Surgical History  Procedure Date  . Oophorectomy 2005  . Av fistula placement   . Eye surgery     right eye     History reviewed. No pertinent family history.  History  Substance Use Topics  . Smoking status: Never Smoker   . Smokeless tobacco: Not on file  . Alcohol Use: No    OB History    Grav Para Term Preterm Abortions TAB SAB Ect Mult Living                  Review of Systems  All other systems reviewed and are negative.    Allergies  Ace inhibitors  Home Medications   Current Outpatient Rx  Name Route Sig Dispense Refill  . AMLODIPINE BESYLATE 10 MG PO TABS Oral Take 10 mg by mouth daily.    . FUROSEMIDE 40 MG PO TABS Oral Take 40 mg by mouth daily.    . INSULIN  GLARGINE 100 UNIT/ML [place] SOLN Subcutaneous Inject 60 Units into the skin at bedtime.    [name] LABETALOL HCL 100 MG PO TABS Oral Take 100 mg by mouth 3 (three) times daily.    [name] LISINOPRIL 5 MG PO TABS Oral Take 5 mg by mouth daily.    [name] METOCLOPRAMIDE HCL 5 MG PO TABS Oral Take 5 mg by mouth 4 (four) times daily.    [name] MYCOPHENOLATE MOFETIL 250 MG PO CAPS Oral Take 750 mg by mouth 2 (two) times daily.       BP 135/68  Pulse 87  Temp(Src) 98.2 F (36.8 C) (Oral)  Resp 20  SpO2 99%  Physical Exam  Nursing note and vitals reviewed. Constitutional: She is oriented to person, place, and time. She appears well-developed and well-nourished.  HENT:  Head: Normocephalic and atraumatic.  Eyes: Conjunctivae and EOM are normal. Pupils are equal, round, and reactive to light.  Neck: Normal range of motion. Neck supple.  Cardiovascular: Normal rate and regular rhythm.   Pulmonary/Chest: Effort normal and breath sounds normal.  Abdominal: Soft. Bowel sounds are normal.  Musculoskeletal: Normal range of motion.  Neurological: She is alert and oriented to person, place, and time.  Skin: Skin is warm and dry.  Psychiatric: She has a normal mood and affect.    ED Course  Procedures (including critical care time)  Labs Reviewed  CBC - Abnormal; Notable for the following:    RBC 2.42 (*)    Hemoglobin 6.8 (*)    HCT 20.0 (*)    All other components within normal limits  DIFFERENTIAL - Abnormal; Notable for the following:    Neutrophils Relative 87 (*)    Lymphocytes Relative 9 (*)    All other components within normal limits  BASIC METABOLIC PANEL - Abnormal; Notable for the following:    Glucose, Bld 124 (*)    BUN 36 (*)    Creatinine, Ser 3.27 (*)    GFR calc non Af Amer 17 (*)    GFR calc Af Amer 20 (*)    All other components within normal limits  TYPE AND SCREEN  ABO/RH  URINALYSIS, ROUTINE W REFLEX MICROSCOPIC   No results found.   No diagnosis found.    MDM  Patient is  anemic status post renal transplant. Will admit for probable transfusion  1400:  Patient does not want to be admitted to [facility] for transfusion. She understands the risks and benefits of leaving AGAINST MEDICAL ADVICE. She completely understands my recommendations. She wants to go [facility] for transfusion.  She is not psychotic and has normal vital signs.  Her husband will take her to [facility], MD 01/21/12 1311  Donnetta Hutching, MD 01/26/12 1539

## 2012-01-22 LAB — TYPE AND SCREEN
ABO/RH(D): AB POS
Antibody Screen: NEGATIVE
Unit division: 0

## 2012-01-29 ENCOUNTER — Encounter (HOSPITAL_COMMUNITY)
Admission: RE | Admit: 2012-01-29 | Discharge: 2012-01-29 | Disposition: A | Discharge status: Home / Self Care | Payer: Medicare Other | Source: Ambulatory Visit | Attending: Nephrology | Admitting: Nephrology

## 2012-01-29 MED ORDER — FUROSEMIDE 10 MG/ML IJ SOLN
80.0000 mg | Freq: Once | INTRAMUSCULAR | Status: AC
  Administered 2012-01-29: 80 mg via INTRAVENOUS
  Filled 2012-01-29: qty 8

## 2012-01-29 MED ORDER — FUROSEMIDE 10 MG/ML IJ SOLN
80.0000 mg | Freq: Once | INTRAMUSCULAR | Status: DC
  Filled 2012-01-29: qty 8

## 2012-01-29 MED ORDER — DARBEPOETIN ALFA-POLYSORBATE 100 MCG/0.5ML IJ SOLN
INTRAMUSCULAR | Status: AC
  Filled 2012-01-29: qty 0.5

## 2012-01-29 MED ORDER — DARBEPOETIN ALFA-POLYSORBATE 100 MCG/0.5ML IJ SOLN
100.0000 ug | INTRAMUSCULAR | Status: DC
  Administered 2012-01-29: 100 ug via SUBCUTANEOUS

## 2012-01-30 LAB — TYPE AND SCREEN
ABO/RH(D): AB POS
Antibody Screen: NEGATIVE
Unit division: 0

## 2012-02-06 ENCOUNTER — Encounter (HOSPITAL_COMMUNITY)
Admission: RE | Admit: 2012-02-06 | Discharge: 2012-02-06 | Disposition: A | Discharge status: Home / Self Care | Payer: Medicare Other | Source: Ambulatory Visit | Attending: Nephrology | Admitting: Nephrology

## 2012-02-06 DIAGNOSIS — D638 Anemia in other chronic diseases classified elsewhere: Secondary | ICD-10-CM | POA: Insufficient documentation

## 2012-02-06 DIAGNOSIS — N183 Chronic kidney disease, stage 3 unspecified: Secondary | ICD-10-CM | POA: Insufficient documentation

## 2012-02-06 MED ORDER — DARBEPOETIN ALFA-POLYSORBATE 100 MCG/0.5ML IJ SOLN
100.0000 ug | INTRAMUSCULAR | Status: DC
Start: 2012-02-06 — End: 2012-02-07
  Administered 2012-02-06: 100 ug via SUBCUTANEOUS

## 2012-02-06 MED ORDER — DARBEPOETIN ALFA-POLYSORBATE 100 MCG/0.5ML IJ SOLN
INTRAMUSCULAR | Status: AC
  Administered 2012-02-06: 100 ug via SUBCUTANEOUS
  Filled 2012-02-06: qty 0.5

## 2012-02-12 ENCOUNTER — Other Ambulatory Visit (HOSPITAL_COMMUNITY): Payer: Self-pay | Admitting: *Deleted

## 2012-02-13 ENCOUNTER — Encounter (HOSPITAL_COMMUNITY): Payer: BC Managed Care – PPO

## 2012-02-18 ENCOUNTER — Encounter (HOSPITAL_COMMUNITY)
Admission: RE | Admit: 2012-02-18 | Discharge: 2012-02-18 | Disposition: A | Discharge status: Home / Self Care | Payer: Medicare Other | Source: Ambulatory Visit | Attending: Nephrology | Admitting: Nephrology

## 2012-02-18 LAB — IRON AND TIBC
Iron: 111 ug/dL (ref 42–135)
Saturation Ratios: 48 % (ref 20–55)
TIBC: 233 ug/dL — ABNORMAL LOW (ref 250–470)

## 2012-02-18 LAB — FERRITIN: Ferritin: 410 ng/mL — ABNORMAL HIGH (ref 10–291)

## 2012-02-18 MED ORDER — DARBEPOETIN ALFA-POLYSORBATE 100 MCG/0.5ML IJ SOLN
100.0000 ug | INTRAMUSCULAR | Status: DC
  Administered 2012-02-18: 100 ug via SUBCUTANEOUS

## 2012-02-18 MED ORDER — DARBEPOETIN ALFA-POLYSORBATE 100 MCG/0.5ML IJ SOLN
INTRAMUSCULAR | Status: AC
  Administered 2012-02-18: 100 ug via SUBCUTANEOUS
  Filled 2012-02-18: qty 0.5

## 2012-02-27 ENCOUNTER — Encounter (HOSPITAL_COMMUNITY): Payer: BC Managed Care – PPO

## 2012-03-11 ENCOUNTER — Encounter (HOSPITAL_COMMUNITY)
Admission: RE | Admit: 2012-03-11 | Discharge: 2012-03-11 | Disposition: A | Discharge status: Home / Self Care | Payer: Medicare Other | Source: Ambulatory Visit | Attending: Nephrology | Admitting: Nephrology

## 2012-03-11 DIAGNOSIS — D638 Anemia in other chronic diseases classified elsewhere: Secondary | ICD-10-CM | POA: Insufficient documentation

## 2012-03-11 DIAGNOSIS — N183 Chronic kidney disease, stage 3 unspecified: Secondary | ICD-10-CM | POA: Insufficient documentation

## 2012-03-11 MED ORDER — DARBEPOETIN ALFA-POLYSORBATE 100 MCG/0.5ML IJ SOLN
100.0000 ug | INTRAMUSCULAR | Status: DC
  Administered 2012-03-11: 100 ug via SUBCUTANEOUS
  Filled 2012-03-11: qty 0.5

## 2012-03-19 ENCOUNTER — Encounter (HOSPITAL_COMMUNITY): Payer: Medicare Other

## 2012-03-24 ENCOUNTER — Encounter (HOSPITAL_COMMUNITY)
Admission: RE | Admit: 2012-03-24 | Discharge: 2012-03-24 | Disposition: A | Discharge status: Home / Self Care | Payer: Medicare Other | Source: Ambulatory Visit | Attending: Nephrology | Admitting: Nephrology

## 2012-03-24 LAB — POCT HEMOGLOBIN-HEMACUE: Hemoglobin: 6.2 g/dL — CL (ref 12.0–15.0)

## 2012-03-24 MED ORDER — DARBEPOETIN ALFA-POLYSORBATE 100 MCG/0.5ML IJ SOLN
100.0000 ug | INTRAMUSCULAR | Status: DC
  Administered 2012-03-24: 100 ug via SUBCUTANEOUS
  Filled 2012-03-24: qty 0.5

## 2012-03-24 NOTE — Progress Notes (Signed)
Called and spoke with [name], CMA from Dr. Marland Mcalpine office to report Hgb of 6.2.  Patient also complains of feeling dizzy and tired, no visible blood in stools.  Per Dr. Hyman [name], [name] stated that patient needs to be evaluated in the ED.  Patient states that she does not want to go to the ED and will come back for her next appt.  Patient given injection and appt made for next week.  Called and left message with [name], CMA at [facility] about patient's refusal to go to the ED.

## 2012-03-25 LAB — IRON AND TIBC
Saturation Ratios: 21 % (ref 20–55)
UIBC: 203 ug/dL (ref 125–400)

## 2012-03-25 LAB — FERRITIN: Ferritin: 427 ng/mL — ABNORMAL HIGH (ref 10–291)

## 2012-04-02 ENCOUNTER — Encounter (HOSPITAL_COMMUNITY): Payer: Self-pay | Admitting: *Deleted

## 2012-04-02 ENCOUNTER — Inpatient Hospital Stay (HOSPITAL_COMMUNITY)
Admission: EM | Admit: 2012-04-02 | Discharge: 2012-04-03 | DRG: 811 | Disposition: A | Discharge status: Home / Self Care | Payer: Medicare Other | Attending: Internal Medicine | Admitting: Internal Medicine

## 2012-04-02 ENCOUNTER — Encounter (HOSPITAL_COMMUNITY)
Admission: RE | Admit: 2012-04-02 | Discharge: 2012-04-02 | Disposition: A | Discharge status: Home / Self Care | Payer: Medicare Other | Source: Ambulatory Visit | Attending: Nephrology | Admitting: Nephrology

## 2012-04-02 DIAGNOSIS — R5383 Other fatigue: Secondary | ICD-10-CM

## 2012-04-02 DIAGNOSIS — I10 Essential (primary) hypertension: Secondary | ICD-10-CM

## 2012-04-02 DIAGNOSIS — R42 Dizziness and giddiness: Secondary | ICD-10-CM

## 2012-04-02 DIAGNOSIS — E11319 Type 2 diabetes mellitus with unspecified diabetic retinopathy without macular edema: Secondary | ICD-10-CM | POA: Diagnosis present

## 2012-04-02 DIAGNOSIS — D649 Anemia, unspecified: Principal | ICD-10-CM | POA: Diagnosis present

## 2012-04-02 DIAGNOSIS — D631 Anemia in chronic kidney disease: Secondary | ICD-10-CM | POA: Diagnosis present

## 2012-04-02 DIAGNOSIS — N19 Unspecified kidney failure: Secondary | ICD-10-CM

## 2012-04-02 DIAGNOSIS — N186 End stage renal disease: Secondary | ICD-10-CM | POA: Diagnosis present

## 2012-04-02 DIAGNOSIS — Z8611 Personal history of tuberculosis: Secondary | ICD-10-CM

## 2012-04-02 DIAGNOSIS — R0602 Shortness of breath: Secondary | ICD-10-CM | POA: Diagnosis present

## 2012-04-02 DIAGNOSIS — I120 Hypertensive chronic kidney disease with stage 5 chronic kidney disease or end stage renal disease: Secondary | ICD-10-CM | POA: Diagnosis present

## 2012-04-02 DIAGNOSIS — Z940 Kidney transplant status: Secondary | ICD-10-CM

## 2012-04-02 DIAGNOSIS — Z833 Family history of diabetes mellitus: Secondary | ICD-10-CM

## 2012-04-02 DIAGNOSIS — E1139 Type 2 diabetes mellitus with other diabetic ophthalmic complication: Secondary | ICD-10-CM | POA: Diagnosis present

## 2012-04-02 DIAGNOSIS — E785 Hyperlipidemia, unspecified: Secondary | ICD-10-CM | POA: Diagnosis present

## 2012-04-02 DIAGNOSIS — R5381 Other malaise: Secondary | ICD-10-CM

## 2012-04-02 LAB — CBC
MCH: 29 pg (ref 26.0–34.0)
MCHC: 34.1 g/dL (ref 30.0–36.0)
MCV: 85.2 fL (ref 78.0–100.0)
Platelets: 245 10*3/uL (ref 150–400)
Platelets: 259 10*3/uL (ref 150–400)
RDW: 14.6 % (ref 11.5–15.5)
RDW: 14 % (ref 11.5–15.5)
WBC: 5.8 10*3/uL (ref 4.0–10.5)

## 2012-04-02 LAB — POCT PREGNANCY, URINE: Preg Test, Ur: NEGATIVE

## 2012-04-02 LAB — BASIC METABOLIC PANEL
Chloride: 106 mEq/L (ref 96–112)
GFR calc Af Amer: 16 mL/min — ABNORMAL LOW (ref >90)
GFR calc non Af Amer: 14 mL/min — ABNORMAL LOW (ref >90)
Potassium: 3.8 mEq/L (ref 3.5–5.1)

## 2012-04-02 LAB — GLUCOSE, CAPILLARY
Glucose-Capillary: 180 mg/dL — ABNORMAL HIGH (ref 70–99)
Glucose-Capillary: 246 mg/dL — ABNORMAL HIGH (ref 70–99)
Glucose-Capillary: 149 mg/dL — ABNORMAL HIGH (ref 70–99)

## 2012-04-02 MED ORDER — INSULIN ASPART 100 UNIT/ML ~~LOC~~ SOLN
0.0000 [IU] | Freq: Three times a day (TID) | SUBCUTANEOUS | Status: DC
  Administered 2012-04-02: 5 [IU] via SUBCUTANEOUS

## 2012-04-02 MED ORDER — FUROSEMIDE 40 MG PO TABS
40.0000 mg | ORAL_TABLET | Freq: Every day | ORAL | Status: DC
  Administered 2012-04-02 – 2012-04-03 (×2): 40 mg via ORAL
  Filled 2012-04-02 (×2): qty 1

## 2012-04-02 MED ORDER — LABETALOL HCL 100 MG PO TABS
100.0000 mg | ORAL_TABLET | Freq: Three times a day (TID) | ORAL | Status: DC
  Administered 2012-04-02 – 2012-04-03 (×3): 100 mg via ORAL
  Filled 2012-04-02 (×5): qty 1

## 2012-04-02 MED ORDER — AMLODIPINE BESYLATE 10 MG PO TABS
10.0000 mg | ORAL_TABLET | Freq: Every day | ORAL | Status: DC
  Administered 2012-04-02 – 2012-04-03 (×2): 10 mg via ORAL
  Filled 2012-04-02 (×2): qty 1

## 2012-04-02 MED ORDER — POLYETHYLENE GLYCOL 3350 17 G PO PACK
17.0000 g | PACK | Freq: Every day | ORAL | Status: DC | PRN
  Filled 2012-04-02: qty 1

## 2012-04-02 MED ORDER — METOCLOPRAMIDE HCL 5 MG PO TABS
5.0000 mg | ORAL_TABLET | Freq: Four times a day (QID) | ORAL | Status: DC
  Administered 2012-04-02 – 2012-04-03 (×3): 5 mg via ORAL
  Filled 2012-04-02 (×6): qty 1

## 2012-04-02 MED ORDER — INSULIN GLARGINE 100 UNIT/ML ~~LOC~~ SOLN
30.0000 [IU] | Freq: Every day | SUBCUTANEOUS | Status: DC
  Administered 2012-04-02: 30 [IU] via SUBCUTANEOUS

## 2012-04-02 MED ORDER — TACROLIMUS 1 MG PO CAPS
2.0000 mg | ORAL_CAPSULE | Freq: Every day | ORAL | Status: DC
  Administered 2012-04-02: 2 mg via ORAL
  Filled 2012-04-02 (×2): qty 2

## 2012-04-02 MED ORDER — TACROLIMUS 1 MG PO CAPS
3.0000 mg | ORAL_CAPSULE | Freq: Every day | ORAL | Status: DC
  Administered 2012-04-03: 3 mg via ORAL
  Filled 2012-04-02 (×2): qty 3

## 2012-04-02 MED ORDER — MYCOPHENOLATE MOFETIL 250 MG PO CAPS
750.0000 mg | ORAL_CAPSULE | Freq: Two times a day (BID) | ORAL | Status: DC
  Administered 2012-04-02 – 2012-04-03 (×2): 750 mg via ORAL
  Filled 2012-04-02 (×3): qty 3

## 2012-04-02 MED ORDER — SODIUM CHLORIDE 0.9 % IJ SOLN: 3.0000 mL | INTRAMUSCULAR | Status: DC | PRN

## 2012-04-02 MED ORDER — HYDROCODONE-ACETAMINOPHEN 5-325 MG PO TABS: 1.0000 | ORAL_TABLET | ORAL | Status: DC | PRN

## 2012-04-02 MED ORDER — LISINOPRIL 5 MG PO TABS
5.0000 mg | ORAL_TABLET | Freq: Every day | ORAL | Status: DC
  Administered 2012-04-02 – 2012-04-03 (×2): 5 mg via ORAL
  Filled 2012-04-02 (×2): qty 1

## 2012-04-02 MED ORDER — ONDANSETRON HCL 4 MG/2ML IJ SOLN: 4.0000 mg | Freq: Four times a day (QID) | INTRAMUSCULAR | Status: DC | PRN

## 2012-04-02 MED ORDER — SODIUM CHLORIDE 0.9 % IV SOLN: 250.0000 mL | INTRAVENOUS | Status: DC | PRN

## 2012-04-02 MED ORDER — SODIUM CHLORIDE 0.9 % IJ SOLN
3.0000 mL | Freq: Two times a day (BID) | INTRAMUSCULAR | Status: DC
  Administered 2012-04-02: 3 mL via INTRAVENOUS

## 2012-04-02 MED ORDER — ONDANSETRON HCL 4 MG PO TABS: 4.0000 mg | ORAL_TABLET | Freq: Four times a day (QID) | ORAL | Status: DC | PRN

## 2012-04-02 NOTE — ED Notes (Signed)
Sent from Short stay for low hemoglobin

## 2012-04-02 NOTE — Progress Notes (Signed)
[name] is a 36 y.o. female patient who transferred  From short stay.awake, alert  & orientated  X 3, No Order, VSS - Blood pressure 150/74, pulse 82, temperature 98.1 F (36.7 C), temperature source Oral, resp. rate 20, height 5\' 7"  (1.702 m), weight 80.74 kg (178 lb), last menstrual period 03/06/2012, SpO2 97.00%.,  no c/o shortness of breath, no c/o chest pain, no distress noted.   IV site WDL: antecubital right, condition patent and no redness with a transparent dsg that's clean dry and intact.  Allergies:   Allergies  Allergen Reactions  . Ace Inhibitors Other (See Comments)     cause cough     Past Medical History  Diagnosis Date  . ESRD (end stage renal disease)     history of peritoneal and Hemodialysis, now s/p renal  transplant  . Renal transplant disorder 2010  . Ovarian tumor, malignant 2005    resected in [place]  . Diabetic retinopathy   . HTN (hypertension)     not well controlled  . 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 HLD (hyperlipidemia)   . CAP (community acquired pneumonia) 2010  . Diabetes mellitus     Pt orientation to unit, room and routine. SR up x 2, fall risk assessment complete with Patient and family verbalizing understanding of risks associated with falls. Pt verbalizes an understanding of how to use the call bell and to call for help before getting out of bed.  Skin, clean-dry- intact without evidence of bruising, or skin tears.   No evidence of skin break down noted on exam.     Will cont to monitor and assist as needed.  Cindra Eves, RN 04/02/2012 2:58 PM

## 2012-04-02 NOTE — ED Notes (Signed)
States hemoglobin this am in short stay was 5.9 Denies bloody, black stools, n/v, abd pain, dizziness. Does c/o SOB on exertion

## 2012-04-02 NOTE — Progress Notes (Signed)
Utilization review completed.  

## 2012-04-02 NOTE — ED Provider Notes (Signed)
I saw and evaluated the patient, reviewed the resident's note and I agree with the findings and plan.  Generalized weakness with worsening anemia. No chest pain, SOB, dizziness.  Glynn Octave, MD 04/02/12 [phone]

## 2012-04-02 NOTE — ED Provider Notes (Signed)
History     CSN: [id number]  Arrival date & time 04/02/12  2956   First MD Initiated Contact with Patient 04/02/12 [phone]      No chief complaint on file.   Patient is a 36 y.o. female presenting with weakness. The history is provided by the patient.  Weakness Primary symptoms do not include headaches, syncope, loss of consciousness, seizures, dizziness, focal weakness, loss of sensation, fever, nausea or vomiting. The symptoms began more than 1 week ago. The symptoms are worsening (gradually worsening). The neurological symptoms are diffuse. Context: Patient has history of renal insuffiency and renal transplant with consequent chronic anemia. Hgb 6.2 last week (most recent hemoglobin in computer records) and 5.9 at Dr. Marland Mcalpine (her nephrologist) office today.   Additional symptoms include weakness. Additional symptoms do not include neck stiffness. Associated medical issues comments: Chronic renal insuffiency.    Past Medical History  Diagnosis Date  . ESRD (end stage renal disease)     history of peritoneal and Hemodialysis, now s/p renal  transplant  . Renal transplant disorder 2010  . Ovarian tumor, malignant 2005    resected in [place]  . Diabetic retinopathy   . HTN (hypertension)     not well controlled  . 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 HLD (hyperlipidemia)   . CAP (community acquired pneumonia) 2010    Past Surgical History  Procedure Date  . Tumor removal 2005  . Av fistula placement   . Eye surgery     right eye     Family History  Problem Relation Age of Onset  . Diabetes Mother   . Liver cancer Father     History  Substance Use Topics  . Smoking status: Never Smoker   . Smokeless tobacco: Not on file  . Alcohol Use: No    OB History    Grav Para Term Preterm Abortions TAB SAB Ect Mult Living                  Review of Systems  Constitutional: Negative  for fever and chills.  HENT: Negative for neck pain and neck stiffness.   Respiratory: Positive for shortness of breath. Negative for cough and chest tightness.   Cardiovascular: Negative for chest pain, palpitations and syncope.  Gastrointestinal: Negative for nausea, vomiting, abdominal pain, diarrhea and constipation.  Genitourinary: Positive for menstrual problem (LMP 2 months ago ). Negative for dysuria, decreased urine volume, vaginal bleeding, difficulty urinating and pelvic pain.  Skin: Negative for rash and wound.  Neurological: Positive for weakness and light-headedness. Negative for dizziness, focal weakness, seizures, loss of consciousness, syncope, facial asymmetry and headaches.  Psychiatric/Behavioral: Negative for confusion and agitation.  All other systems reviewed and are negative.    Allergies  Ace inhibitors  Home Medications   Current Outpatient Rx  Name Route Sig Dispense Refill  . AMLODIPINE BESYLATE 10 MG PO TABS Oral Take 10 mg by mouth daily.    . FUROSEMIDE 40 MG PO TABS Oral Take 40 mg by mouth daily.    . INSULIN GLARGINE 100 UNIT/ML [place] Ch[place] SOLN Subcutaneous Inject 60 Units into the skin at bedtime.    [name] LABETALOL HCL 100 MG PO TABS Oral Take 100 mg by mouth 3 (three) times daily.    [name] LISINOPRIL 5 MG PO TABS Oral Take 5 mg by mouth daily.    [name] METOCLOPRAMIDE HCL 5 MG  PO TABS Oral Take 5 mg by mouth 4 (four) times daily.    [name] MYCOPHENOLATE MOFETIL 250 MG PO CAPS Oral Take 750 mg by mouth 2 (two) times daily.     [name] TACROLIMUS 1 MG PO CAPS Oral Take 2-3 mg by mouth 2 (two) times daily. 3 capsules in the morning 2 capsules at night      There were no vitals taken for this visit.  Physical Exam  Nursing note and vitals reviewed. Constitutional: She is oriented to person, place, and time. She appears well-developed and well-nourished.  HENT:  Head: Normocephalic and atraumatic.  Right Ear: External ear normal.  Left Ear: External ear normal.  Nose: Nose  normal.  Mouth/Throat: Oropharynx is clear and moist. No oropharyngeal exudate.  Eyes: Conjunctivae are normal. Pupils are equal, round, and reactive to light.  Neck: Normal range of motion. Neck supple.  Cardiovascular: Normal rate, regular rhythm, normal heart sounds and intact distal pulses.  Exam reveals no gallop and no friction rub.   No murmur heard. Pulmonary/Chest: Effort normal and breath sounds normal. No respiratory distress.  Abdominal: Soft. Bowel sounds are normal. She exhibits no distension and no mass. There is no tenderness. There is no rebound and no guarding.  Musculoskeletal: Normal range of motion. She exhibits no edema and no tenderness.  Neurological: She is alert and oriented to person, place, and time. No cranial nerve deficit. She exhibits normal muscle tone.  Skin: Skin is warm and dry.  Psychiatric: She has a normal mood and affect. Her behavior is normal. Judgment and thought content normal.    ED Course  Procedures (including critical care time)  Labs Reviewed  CBC - Abnormal; Notable for the following:    RBC 2.01 (*)     Hemoglobin 6.0 (*)     HCT 17.3 (*)     All other components within normal limits  BASIC METABOLIC PANEL - Abnormal; Notable for the following:    CO2 16 (*)     Glucose, Bld 210 (*)     BUN 41 (*)     Creatinine, Ser 3.96 (*)     GFR calc non Af Amer 14 (*)     GFR calc Af Amer 16 (*)     All other components within normal limits  GLUCOSE, CAPILLARY - Abnormal; Notable for the following:    Glucose-Capillary 180 (*)     All other components within normal limits  PREPARE RBC (CROSSMATCH)  TYPE AND SCREEN  POCT PREGNANCY, URINE  CBC  TSH  HEMOGLOBIN A1C   No results found.   1. Anemia       MDM  36 yo F w/hx of renal disease and chronic anemia presents due to symptomatic anemia. Hgb 5.9 at renal clinic per patient report (pt states she was at Dr. Marland Mcalpine office today); however, records reviewed and no hemoglobin found  in past 8 days in records (most recently 6.2 eight days ago). Patient is symptomatic with light-headedness and mild dyspnea. Denies chest pain. Patient's LMP was 2 months ago; will check urine hCG. Patient consented and typed and crossed for 2 units (consented for transfusion by myself). Hospitalist consulted and will admit.          Clemetine Marker, MD 04/02/12 1531

## 2012-04-02 NOTE — H&P (Signed)
[facility] History and Physical  [name] YQM:[id number] DOB: 08/10/1976 DOA: 04/02/2012   PCP: Juline Patch, MD   Chief Complaint: Tired weak and fatigued  HPI:  This a 36 year old female with past medical history of renal transplant, requiring multiple transfusions in the past, with the last hemoglobin of 6.8 back in 01/21/2012 that comes in for fatigue and generalized weakness. She's he has also had some lightheadedness and mild dyspnea on exertion As per patient it has progressively gotten worse to the point where she can even walk to the door without being tired. She relates she has not had any hematemesis, No melena or no bleeding episodes. And at that she has been treated with erythropoietin for her low hemoglobin by her nephrologist.  Review of Systems:  Constitutional:  No weight loss, night sweats, Fevers, chills, fatigue.  HEENT:  No headaches, Difficulty swallowing,Tooth/dental problems,Sore throat, No sneezing, itching, ear ache, nasal congestion, post nasal drip,  Cardio-vascular:  No chest pain, Orthopnea, PND, swelling in lower extremities, anasarca, dizziness, palpitations  GI:  No heartburn, indigestion, abdominal pain, nausea, vomiting, diarrhea, change in bowel habits, loss of appetite  Resp:  No shortness of breath with exertion or at rest. No excess mucus, no productive cough, No non-productive cough, No coughing up of blood.No change in color of mucus.No wheezing.No chest wall deformity  Skin:  no rash or lesions.  GU:  no dysuria, change in color of urine, no urgency or frequency. No flank pain.  Musculoskeletal:  No joint pain or swelling. No decreased range of motion. No back pain.  Psych:  No change in mood or affect. No depression or anxiety. No memory loss.    Past Medical History  Diagnosis Date  . ESRD (end stage renal disease)     history of peritoneal and Hemodialysis, now s/p renal  transplant  . Renal transplant disorder 2010  . Ovarian  tumor, malignant 2005    resected in [place]  . Diabetic retinopathy   . HTN (hypertension)     not well controlled  . 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 HLD (hyperlipidemia)   . CAP (community acquired pneumonia) 2010  . Diabetes mellitus    Past Surgical History  Procedure Date  . Tumor removal 2005  . Av fistula placement   . Eye surgery     right eye   . Kidney transplant    Social History:  reports that she has never smoked. She does not have any smokeless tobacco history on file. She reports that she does not drink alcohol or use illicit drugs.  Allergies  Allergen Reactions  . Ace Inhibitors Other (See Comments)     cause cough    Family History  Problem Relation Age of Onset  . Diabetes Mother   . Liver cancer Father   . Cancer Father     Prior to Admission medications   Medication Sig Start Date End Date Taking? Authorizing Provider  amLODipine (NORVASC) 10 MG tablet Take 10 mg by mouth daily.   Yes Historical Provider, MD  furosemide (LASIX) 40 MG tablet Take 40 mg by mouth daily.   Yes Historical Provider, MD  insulin glargine (LANTUS) 100 UNIT/ML injection Inject 60 Units into the skin at bedtime.   Yes Historical Provider, MD  labetalol (NORMODYNE) 100 MG tablet Take 100 mg by mouth 3 (three) times daily.   Yes Historical Provider,  MD  lisinopril (PRINIVIL,ZESTRIL) 5 MG tablet Take 5 mg by mouth daily.   Yes Historical Provider, MD  metoCLOPramide (REGLAN) 5 MG tablet Take 5 mg by mouth 4 (four) times daily.   Yes Historical Provider, MD  mycophenolate (CELLCEPT) 250 MG capsule Take 750 mg by mouth 2 (two) times daily.    Yes Historical Provider, MD  tacrolimus (PROGRAF) 1 MG capsule Take 2-3 mg by mouth 2 (two) times daily. 3 capsules in the morning 2 capsules at night   Yes Historical Provider, MD   Physical Exam: Filed Vitals:   04/02/12 1030 04/02/12 1219  04/02/12 1255 04/02/12 1427  BP: 141/70 143/66 130/68 150/74  Pulse: 80 81 86 82  Temp:   98.3 F (36.8 C) 98.1 F (36.7 C)  TempSrc:   Oral Oral  Resp: 18 17 20 20   Height:   5\' 7"  (1.702 m)   Weight:   80.74 kg (178 lb)   SpO2: 97% 97% 97%    BP 150/74  Pulse 82  Temp 98.1 F (36.7 C) (Oral)  Resp 20  Ht 5\' 7"  (1.702 m)  Wt 80.74 kg (178 lb)  BMI 27.88 kg/m2  SpO2 97%  LMP 03/06/2012  General Appearance:    Alert, cooperative, no distress, appears stated age, pale appearing   Head:    Normocephalic, without obvious abnormality, atraumatic  Eyes:    PERRL, conjunctiva/corneas clear, EOM's intact, fundi    benign, both eyes  Ears:    Normal TM's and external ear canals, both ears  Nose:   Nares normal, septum midline, mucosa normal, no drainage    or sinus tenderness  Throat:   Lips, mucosa, and tongue normal; teeth and gums normal  Neck:   Supple, symmetrical, trachea midline, no adenopathy;    thyroid:  no enlargement/tenderness/nodules; no carotid   bruit or JVD  Back:     Symmetric, no curvature, ROM normal, no CVA tenderness  Lungs:     Clear to auscultation bilaterally, respirations unlabored  Chest Wall:    No tenderness or deformity   Heart:    Regular rate and rhythm, S1 and S2 normal, no murmur, rub   or gallop     Abdomen:     Soft, non-tender, bowel sounds active all four quadrants,    no masses, no organomegaly        Extremities:   Extremities normal, atraumatic, no cyanosis or edema  Pulses:   2+ and symmetric all extremities  Skin:   Skin color, texture, turgor normal, no rashes or lesions  Lymph nodes:   Cervical, supraclavicular, and axillary nodes normal  Neurologic:   CNII-XII intact, normal strength, sensation and reflexes    throughout    Labs on Admission:  Basic Metabolic Panel:  Lab 04/02/12 1914  NA 138  K 3.8  CL 106  CO2 16*  GLUCOSE 210*  BUN 41*  CREATININE 3.96*  CALCIUM 8.5  MG --  PHOS --   Liver Function Tests: No  results found for this basename: AST:5,ALT:5,ALKPHOS:5,BILITOT:5,PROT:5,ALBUMIN:5 in the last 168 hours No results found for this basename: LIPASE:5,AMYLASE:5 in the last 168 hours No results found for this basename: AMMONIA:5 in the last 168 hours CBC:  Lab 04/02/12 0951  WBC 5.8  NEUTROABS --  HGB 6.0*  HCT 17.3*  MCV 86.1  PLT 245   Cardiac Enzymes: No results found for this basename: CKTOTAL:5,CKMB:5,CKMBINDEX:5,TROPONINI:5 in the last 168 hours BNP: No components found with this basename: POCBNP:5 CBG:  Lab 04/02/12 1301  GLUCAP 180*    Radiological Exams on Admission: No results found.  EKG: Independently reviewed. None  Assessment/Plan: Principal Problem:  *Fatigue and lightheadedness/Shortness of breath: Most likely secondary to her severe Anemia. She relates no melanotic stools, no hematemesis or obvious source of bleeding. She does have a history of getting erythropoietin in the past secondary to her renal function. She has gotten multiple transfusions in the past for low hemoglobin. We'll go ahead and admit her chance user 2 units of packed red blood cells and check a CBC 4 hours posttransfusion. -Doubt this is an infectious process, she doesn't have a leukocytosis or fevers. She is on immunosuppressive medication for her renal transplant.   RENAL FAILURE, CHRONIC/History of renal transplant:  -Creatinine seems to be at baseline.  Time spend: Reviewed and 30 minutes Code Status: Full code Disposition Plan:   Marinda Elk, MD  [facility] Pager [phone]  If 7PM-7AM, please contact night-coverage [email or web address] Password [facility] 04/02/2012, 2:58 PM

## 2012-04-02 NOTE — Progress Notes (Signed)
Pt refused to wear her SCD's. Peter Congo RN

## 2012-04-03 DIAGNOSIS — R5383 Other fatigue: Secondary | ICD-10-CM

## 2012-04-03 DIAGNOSIS — N19 Unspecified kidney failure: Secondary | ICD-10-CM

## 2012-04-03 DIAGNOSIS — I10 Essential (primary) hypertension: Secondary | ICD-10-CM

## 2012-04-03 DIAGNOSIS — D649 Anemia, unspecified: Secondary | ICD-10-CM

## 2012-04-03 DIAGNOSIS — R5381 Other malaise: Secondary | ICD-10-CM

## 2012-04-03 LAB — TYPE AND SCREEN
ABO/RH(D): AB POS
Antibody Screen: NEGATIVE
Unit division: 0
Unit division: 0

## 2012-04-03 LAB — CBC
HCT: 26.4 % — ABNORMAL LOW (ref 36.0–46.0)
MCH: 29.3 pg (ref 26.0–34.0)
MCHC: 34.5 g/dL (ref 30.0–36.0)
MCV: 84.9 fL (ref 78.0–100.0)
RDW: 13.9 % (ref 11.5–15.5)
WBC: 4.4 10*3/uL (ref 4.0–10.5)

## 2012-04-03 LAB — COMPREHENSIVE METABOLIC PANEL
Albumin: 3.3 g/dL — ABNORMAL LOW (ref 3.5–5.2)
BUN: 43 mg/dL — ABNORMAL HIGH (ref 6–23)
Chloride: 107 mEq/L (ref 96–112)
Creatinine, Ser: 4.26 mg/dL — ABNORMAL HIGH (ref 0.50–1.10)
Total Bilirubin: 0.3 mg/dL (ref 0.3–1.2)

## 2012-04-03 LAB — GLUCOSE, CAPILLARY: Glucose-Capillary: 78 mg/dL (ref 70–99)

## 2012-04-03 NOTE — Care Management Note (Signed)
    Page 1 of 1   04/03/2012     11:45:35 AM   CARE MANAGEMENT NOTE 04/03/2012  Patient:  [name], [name]   Account Number:  [id number]  Date Initiated:  04/03/2012  Documentation initiated by:  Letha Cape  Subjective/Objective Assessment:   dx fatigue  admit- lives with spouse.  pta independent.     Action/Plan:   Anticipated DC Date:  04/03/2012   Anticipated DC Plan:  HOME/SELF CARE      DC Planning Services  CM consult      Choice offered to / List presented to:             Status of service:  Completed, signed off Medicare Important Message given?   (If response is "NO", the following Medicare IM given date fields will be blank) Date Medicare IM given:   Date Additional Medicare IM given:    Discharge Disposition:  HOME/SELF CARE  Per UR Regulation:  Reviewed for med. necessity/level of care/duration of stay  If discussed at Long Length of Stay Meetings, dates discussed:    Comments:  04/03/12 11:44 Letha Cape RN, BSN [phone] patient lives with spouse, pta independent.  Patient had medication coverage and transportation.  No needs anticipated.  Patient dc to home today.

## 2012-04-03 NOTE — Discharge Summary (Signed)
Patient ID: [name] MRN: [id number] DOB/AGE: [birth year] 35 y.o.  Admit date: 04/02/2012 Discharge date: 04/03/2012  Primary Care Physician:  Juline Patch, MD Discharge Diagnoses:   Principal Problem:  *Fatigue Active Problems:  HYPERTENSION  RENAL FAILURE, CHRONIC  Shortness of breath  History of renal transplant  Anemia   Medication List  As of 04/03/2012  8:54 AM   TAKE these medications         amLODipine 10 MG tablet   Commonly known as: NORVASC   Take 10 mg by mouth daily.      furosemide 40 MG tablet   Commonly known as: LASIX   Take 40 mg by mouth daily.      insulin glargine 100 UNIT/ML injection   Commonly known as: LANTUS   Inject 60 Units into the skin at bedtime.      labetalol 100 MG tablet   Commonly known as: NORMODYNE   Take 100 mg by mouth 3 (three) times daily.      lisinopril 5 MG tablet   Commonly known as: PRINIVIL,ZESTRIL   Take 5 mg by mouth daily.      metoCLOPramide 5 MG tablet   Commonly known as: REGLAN   Take 5 mg by mouth 4 (four) times daily.      mycophenolate 250 MG capsule   Commonly known as: CELLCEPT   Take 750 mg by mouth 2 (two) times daily.      tacrolimus 1 MG capsule   Commonly known as: PROGRAF   Take 2-3 mg by mouth 2 (two) times daily. 3 capsules in the morning  2 capsules at night            Consults:   Brief H and P: From the admission note:  This a 36 year old female with past medical history of renal transplant, requiring multiple transfusions in the past, with the last hemoglobin of 6.8 back in 01/21/2012 that comes in for fatigue and generalized weakness. She's he has also had some lightheadedness and mild dyspnea on exertion As per patient it has progressively gotten worse to the point where she can even walk to the door without being tired. She relates she has not had any hematemesis, No melena or no bleeding episodes. And at that she has been treated with erythropoietin for her low hemoglobin by her  nephrologist.  *Fatigue and lightheadedness/Shortness of breath:  Most likely secondary to her severe Anemia. She relates no melanotic stools, no hematemesis or obvious source of bleeding. She does have a history of getting erythropoietin in the past secondary to her renal function. She has gotten multiple transfusions in the past for low hemoglobin.  Doubt this is an infectious process, she doesn't have a leukocytosis or fevers. She is on immunosuppressive medication for her renal transplant.  She was transfused 2 units of packed red blood cells and improved significantly over night.  She is now feeling much better and less fatigued, and requesting discharge to home.  Physical Exam on Discharge: General: Alert, awake, oriented x3, in no acute distress. With slight moon facies HEENT: No bruits, no goiter. Heart: Regular rate and rhythm, without murmurs, rubs, gallops. Lungs: Clear to auscultation bilaterally. Abdomen: Soft, nontender, nondistended, positive bowel sounds. Extremities: No clubbing cyanosis or edema with positive pedal pulses. Neuro: Grossly intact, nonfocal.  Filed Vitals:   04/02/12 2039 04/02/12 2045 04/02/12 2255 04/03/12 0516  BP: 152/71 163/74 161/77 160/78  Pulse: 81 82  81  Temp: 98.2 F (36.8 C) 98 F (  36.7 C)  97.9 F (36.6 C)  TempSrc: Oral Oral  Oral  Resp: 16 20  18   Height:      Weight:      SpO2: 96%   97%     Intake/Output Summary (Last 24 hours) at 04/03/12 0854 Last data filed at 04/02/12 1830  Gross per 24 hour  Intake    625 ml  Output      0 ml  Net    625 ml    Basic Metabolic Panel:  Lab 04/03/12 1610 04/02/12 0951  NA 139 138  K 3.7 3.8  CL 107 106  CO2 17* 16*  GLUCOSE 74 210*  BUN 43* 41*  CREATININE 4.26* 3.96*  CALCIUM 8.8 8.5  MG -- --  PHOS -- --   Liver Function Tests:  Lab 04/03/12 0708  AST 10  ALT 12  ALKPHOS 76  BILITOT 0.3  PROT 6.1  ALBUMIN 3.3*   CBC:  Lab 04/03/12 0708 04/02/12 2139  WBC 4.4 5.0    NEUTROABS -- --  HGB 9.1* 9.0*  HCT 26.4* 26.4*  MCV 84.9 85.2  PLT 244 259   CBG:  Lab 04/03/12 0753 04/02/12 2222 04/02/12 1720 04/02/12 1301  GLUCAP 78 149* 246* 180*   Hemoglobin A1C:  Lab 04/02/12 1721  HGBA1C 6.3*   Thyroid Function Tests:  Lab 04/02/12 1721  TSH 2.079  T4TOTAL --  FREET4 --  T3FREE --  THYROIDAB --    Disposition and Follow-up: stable for discharge to home with nephrology follow up.  Discharge Orders    Future Orders Please Complete By Expires   Diet - low sodium heart healthy      Increase activity slowly        Follow-up Information    Follow up with Juline Patch, MD.   Contact information:   [address] [place] [address] [phone]       Follow up with Garnetta Buddy, MD in 1 week.   Contact information:   [address] [place] [address] [phone]           Time spent on Discharge: 25 min  Signed: Stephani Police 04/03/2012, 8:54 AM [phone]

## 2012-04-03 NOTE — Progress Notes (Signed)
1120 Discharge instructions reviewed with patient verbalize and understand. Skin WNL

## 2012-04-04 NOTE — Discharge Summary (Signed)
-  Slightly cause of her symptoms was a low hemoglobin. She had one bowel movement here in the hospital that was normal. She will need to followup with her nephrologist as an outpatient and continue iron and erythropoietin as an outpatient. -Was transfused 2 units of packed red blood cells while she was in the hospital. Hemoglobin has remained stable.

## 2012-04-16 ENCOUNTER — Encounter (HOSPITAL_COMMUNITY)
Admission: RE | Admit: 2012-04-16 | Discharge: 2012-04-16 | Disposition: A | Discharge status: Home / Self Care | Payer: Medicare Other | Source: Ambulatory Visit | Attending: Nephrology | Admitting: Nephrology

## 2012-04-16 ENCOUNTER — Emergency Department (HOSPITAL_COMMUNITY)
Admission: EM | Admit: 2012-04-16 | Discharge: 2012-04-16 | Disposition: A | Discharge status: Home / Self Care | Payer: Medicare Other | Attending: Emergency Medicine | Admitting: Emergency Medicine

## 2012-04-16 ENCOUNTER — Encounter (HOSPITAL_COMMUNITY): Payer: Self-pay | Admitting: *Deleted

## 2012-04-16 DIAGNOSIS — Z794 Long term (current) use of insulin: Secondary | ICD-10-CM | POA: Insufficient documentation

## 2012-04-16 DIAGNOSIS — Z79899 Other long term (current) drug therapy: Secondary | ICD-10-CM | POA: Insufficient documentation

## 2012-04-16 DIAGNOSIS — D649 Anemia, unspecified: Secondary | ICD-10-CM | POA: Insufficient documentation

## 2012-04-16 DIAGNOSIS — E119 Type 2 diabetes mellitus without complications: Secondary | ICD-10-CM | POA: Insufficient documentation

## 2012-04-16 DIAGNOSIS — N183 Chronic kidney disease, stage 3 unspecified: Secondary | ICD-10-CM | POA: Insufficient documentation

## 2012-04-16 DIAGNOSIS — E785 Hyperlipidemia, unspecified: Secondary | ICD-10-CM | POA: Insufficient documentation

## 2012-04-16 DIAGNOSIS — Z940 Kidney transplant status: Secondary | ICD-10-CM | POA: Insufficient documentation

## 2012-04-16 DIAGNOSIS — D638 Anemia in other chronic diseases classified elsewhere: Secondary | ICD-10-CM | POA: Insufficient documentation

## 2012-04-16 DIAGNOSIS — I10 Essential (primary) hypertension: Secondary | ICD-10-CM | POA: Insufficient documentation

## 2012-04-16 LAB — COMPREHENSIVE METABOLIC PANEL
ALT: 8 U/L (ref 0–35)
Alkaline Phosphatase: 83 U/L (ref 39–117)
BUN: 58 mg/dL — ABNORMAL HIGH (ref 6–23)
CO2: 16 mEq/L — ABNORMAL LOW (ref 19–32)
Chloride: 105 mEq/L (ref 96–112)
GFR calc Af Amer: 15 mL/min — ABNORMAL LOW (ref >90)
GFR calc non Af Amer: 13 mL/min — ABNORMAL LOW (ref >90)
Glucose, Bld: 184 mg/dL — ABNORMAL HIGH (ref 70–99)
Potassium: 4.1 mEq/L (ref 3.5–5.1)
Sodium: 137 mEq/L (ref 135–145)
Total Bilirubin: 0.2 mg/dL — ABNORMAL LOW (ref 0.3–1.2)
Total Protein: 6.5 g/dL (ref 6.0–8.3)

## 2012-04-16 LAB — CBC WITH DIFFERENTIAL/PLATELET
Eosinophils Absolute: 0.1 10*3/uL (ref 0.0–0.7)
Hemoglobin: 7.8 g/dL — ABNORMAL LOW (ref 12.0–15.0)
Lymphocytes Relative: 18 % (ref 12–46)
Lymphs Abs: 0.8 10*3/uL (ref 0.7–4.0)
MCH: 29.1 pg (ref 26.0–34.0)
MCV: 81.7 fL (ref 78.0–100.0)
Monocytes Relative: 5 % (ref 3–12)
Neutrophils Relative %: 76 % (ref 43–77)
Platelets: 199 10*3/uL (ref 150–400)
RBC: 2.68 MIL/uL — ABNORMAL LOW (ref 3.87–5.11)
WBC: 4.5 10*3/uL (ref 4.0–10.5)

## 2012-04-16 LAB — SAMPLE TO BLOOD BANK

## 2012-04-16 LAB — PROTIME-INR: Prothrombin Time: 13.5 seconds (ref 11.6–15.2)

## 2012-04-16 MED ORDER — DARBEPOETIN ALFA-POLYSORBATE 100 MCG/0.5ML IJ SOLN
100.0000 ug | INTRAMUSCULAR | Status: DC
  Administered 2012-04-16: 100 ug via SUBCUTANEOUS
  Filled 2012-04-16: qty 0.5

## 2012-04-16 NOTE — ED Notes (Signed)
PT. DENIES ANY PAIN OR DISCOMFORT , RESPIRATIONS UNLABORED  .

## 2012-04-16 NOTE — ED Notes (Signed)
Pt updated on care, has no complaints athis time, will continue to monitor

## 2012-04-16 NOTE — ED Notes (Signed)
The pt was sent here from short stay with a ?? Low hgb.  No bleeding.  She had to have a transfusion the 29.  We have a little language barrier

## 2012-04-16 NOTE — Progress Notes (Signed)
Called and spoke with [name], CMA from [facility] about patient's Hbg of 7.6.  Patient did receive injection and made another appointment.  Instructed me to inform patient to go to the ED for evaluation.  Patient stated that she had to go to get son from the gym and would come back and go to the ED.

## 2012-04-16 NOTE — ED Provider Notes (Signed)
History     CSN: [id number]  Arrival date & time 04/16/12  1745   First MD Initiated Contact with Patient 04/16/12 2046      Chief Complaint  Patient presents with  . low hgb     (Consider location/radiation/quality/duration/timing/severity/associated sxs/prior treatment) HPI Comments: Patient has history of kidney transplant and chronic anemia no longer ondialysis presenting with low hemoglobin from short stay center. She has a history of chronic anemia as required transfusion on June 29. She denies complaints at this time. She was getting her blood drawn as she does weekly Denies chest pain, shortness of breath, lightheadedness, dizziness, nausea vomiting. Denies any blood in stools.  The history is provided by the patient.    Past Medical History  Diagnosis Date  . ESRD (end stage renal disease)     history of peritoneal and Hemodialysis, now s/p renal  transplant  . Renal transplant disorder 2010  . Ovarian tumor, malignant 2005    resected in [place]  . Diabetic retinopathy   . HTN (hypertension)     not well controlled  . 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 HLD (hyperlipidemia)   . CAP (community acquired pneumonia) 2010  . Diabetes mellitus     Past Surgical History  Procedure Date  . Tumor removal 2005  . Av fistula placement   . Eye surgery     right eye   . Kidney transplant     Family History  Problem Relation Age of Onset  . Diabetes Mother   . Liver cancer Father   . Cancer Father     History  Substance Use Topics  . Smoking status: Never Smoker   . Smokeless tobacco: Not on file  . Alcohol Use: No    OB History    Grav Para Term Preterm Abortions TAB SAB Ect Mult Living                  Review of Systems  Constitutional: Negative for fever, activity change, appetite change and fatigue.  HENT: Negative for congestion and rhinorrhea.     Respiratory: Negative for cough, chest tightness and shortness of breath.   Cardiovascular: Negative for chest pain.  Gastrointestinal: Negative for nausea, vomiting and abdominal pain.  Genitourinary: Negative for dysuria.  Musculoskeletal: Negative for back pain.  Skin: Negative for wound.  Neurological: Negative for dizziness, weakness, light-headedness and headaches.    Allergies  Ace inhibitors  Home Medications   Current Outpatient Rx  Name Route Sig Dispense Refill  . AMLODIPINE BESYLATE 10 MG PO TABS Oral Take 10 mg by mouth daily.    . FUROSEMIDE 40 MG PO TABS Oral Take 40 mg by mouth daily.    . INSULIN GLARGINE 100 UNIT/ML [place] SOLN Subcutaneous Inject 60 Units into the skin at bedtime.    [name] LABETALOL HCL 100 MG PO TABS Oral Take 100 mg by mouth 3 (three) times daily.    [name] LISINOPRIL 5 MG PO TABS Oral Take 5 mg by mouth daily.    [name] METOCLOPRAMIDE HCL 5 MG PO TABS Oral Take 5 mg by mouth 4 (four) times daily.    [name] MYCOPHENOLATE MOFETIL 250 MG PO CAPS Oral Take 750 mg by mouth 2 (two) times daily.     [name] SIMVASTATIN 5 MG PO TABS Oral Take 5 mg by mouth every morning.    . SULFAMETHOXAZOLE-TMP DS 800-160  MG PO TABS Oral Take 1 tablet by mouth every Monday, Wednesday, and Friday.    [name] TACROLIMUS 1 MG PO CAPS Oral Take 2-3 mg by mouth 2 (two) times daily. 3 capsules in the morning 2 capsules at night      BP 138/82  Pulse 94  Temp 97.8 F (36.6 C) (Oral)  Resp 21  SpO2 97%  LMP 03/06/2012  Physical Exam  Constitutional: She is oriented to person, place, and time. She appears well-developed and well-nourished. No distress.  HENT:  Head: Normocephalic and atraumatic.  Mouth/Throat: Oropharynx is clear and moist. No oropharyngeal exudate.  Eyes: Conjunctivae are normal. Pupils are equal, round, and reactive to light.  Neck: Normal range of motion. Neck supple.  Cardiovascular: Normal rate, regular rhythm and normal heart sounds.   Pulmonary/Chest: Effort normal and  breath sounds normal. No respiratory distress.  Abdominal: Soft. There is no tenderness. There is no rebound and no guarding.  Musculoskeletal: Normal range of motion. She exhibits no edema and no tenderness.  Neurological: She is alert and oriented to person, place, and time. No cranial nerve deficit.  Skin: Skin is warm.    ED Course  Procedures (including critical care time)  Labs Reviewed  CBC WITH DIFFERENTIAL - Abnormal; Notable for the following:    RBC 2.68 (*)     Hemoglobin 7.8 (*)     HCT 21.9 (*)     All other components within normal limits  COMPREHENSIVE METABOLIC PANEL - Abnormal; Notable for the following:    CO2 16 (*)     Glucose, Bld 184 (*)     BUN 58 (*)     Creatinine, Ser 4.20 (*)     Total Bilirubin 0.2 (*)     GFR calc non Af Amer 13 (*)     GFR calc Af Amer 15 (*)     All other components within normal limits  SAMPLE TO BLOOD BANK  APTT  PROTIME-INR   No results found.   1. Anemia   2. S/P kidney transplant       MDM  Renal transplant with chronic anemia presenting from short stay center with reportedly low hemoglobin. Patient asymptomatic denies chest pain, shortness of breath, dizziness, lightheadedness, fatigue.  Hemoglobin 7.8 today stable from short stay center. Hemoglobin was 9 discharge in the 29th. She was transfused at a level of 6 when she was symptomatic.  No symptoms today.  Hemoglobin within acceptable range. No indication for transfusion as she is not symptomatic.  Follow up with Dr. Hyman Hopes as scheduled.     Glynn Octave, MD 04/17/12 [phone]

## 2012-04-23 ENCOUNTER — Encounter (HOSPITAL_COMMUNITY): Payer: BC Managed Care – PPO

## 2012-04-23 ENCOUNTER — Encounter (HOSPITAL_COMMUNITY): Payer: Self-pay | Admitting: *Deleted

## 2012-04-23 ENCOUNTER — Inpatient Hospital Stay (HOSPITAL_COMMUNITY)
Admission: EM | Admit: 2012-04-23 | Discharge: 2012-04-23 | DRG: 811 | Disposition: A | Discharge status: Home / Self Care | Payer: Medicare Other | Attending: Emergency Medicine | Admitting: Emergency Medicine

## 2012-04-23 DIAGNOSIS — I120 Hypertensive chronic kidney disease with stage 5 chronic kidney disease or end stage renal disease: Secondary | ICD-10-CM | POA: Diagnosis present

## 2012-04-23 DIAGNOSIS — R5383 Other fatigue: Secondary | ICD-10-CM | POA: Diagnosis present

## 2012-04-23 DIAGNOSIS — IMO0002 Reserved for concepts with insufficient information to code with codable children: Secondary | ICD-10-CM

## 2012-04-23 DIAGNOSIS — Z8701 Personal history of pneumonia (recurrent): Secondary | ICD-10-CM

## 2012-04-23 DIAGNOSIS — Z992 Dependence on renal dialysis: Secondary | ICD-10-CM

## 2012-04-23 DIAGNOSIS — D649 Anemia, unspecified: Secondary | ICD-10-CM

## 2012-04-23 DIAGNOSIS — D631 Anemia in chronic kidney disease: Principal | ICD-10-CM | POA: Diagnosis present

## 2012-04-23 DIAGNOSIS — E119 Type 2 diabetes mellitus without complications: Secondary | ICD-10-CM

## 2012-04-23 DIAGNOSIS — R5381 Other malaise: Secondary | ICD-10-CM

## 2012-04-23 DIAGNOSIS — Z794 Long term (current) use of insulin: Secondary | ICD-10-CM

## 2012-04-23 DIAGNOSIS — Z7982 Long term (current) use of aspirin: Secondary | ICD-10-CM

## 2012-04-23 DIAGNOSIS — Z833 Family history of diabetes mellitus: Secondary | ICD-10-CM

## 2012-04-23 DIAGNOSIS — Z940 Kidney transplant status: Secondary | ICD-10-CM

## 2012-04-23 DIAGNOSIS — N186 End stage renal disease: Secondary | ICD-10-CM | POA: Diagnosis present

## 2012-04-23 DIAGNOSIS — E1139 Type 2 diabetes mellitus with other diabetic ophthalmic complication: Secondary | ICD-10-CM | POA: Diagnosis present

## 2012-04-23 DIAGNOSIS — Z91199 Patient's noncompliance with other medical treatment and regimen due to unspecified reason: Secondary | ICD-10-CM

## 2012-04-23 DIAGNOSIS — Z800 Family history of malignant neoplasm of digestive organs: Secondary | ICD-10-CM

## 2012-04-23 DIAGNOSIS — N189 Chronic kidney disease, unspecified: Secondary | ICD-10-CM

## 2012-04-23 DIAGNOSIS — Z8611 Personal history of tuberculosis: Secondary | ICD-10-CM

## 2012-04-23 DIAGNOSIS — I10 Essential (primary) hypertension: Secondary | ICD-10-CM | POA: Diagnosis present

## 2012-04-23 DIAGNOSIS — Z9119 Patient's noncompliance with other medical treatment and regimen: Secondary | ICD-10-CM

## 2012-04-23 DIAGNOSIS — E11319 Type 2 diabetes mellitus with unspecified diabetic retinopathy without macular edema: Secondary | ICD-10-CM | POA: Diagnosis present

## 2012-04-23 DIAGNOSIS — Z79899 Other long term (current) drug therapy: Secondary | ICD-10-CM

## 2012-04-23 DIAGNOSIS — E785 Hyperlipidemia, unspecified: Secondary | ICD-10-CM | POA: Diagnosis present

## 2012-04-23 LAB — POCT I-STAT, CHEM 8
BUN: 64 mg/dL — ABNORMAL HIGH (ref 6–23)
Hemoglobin: 5.4 g/dL — CL (ref 12.0–15.0)
Potassium: 3.7 mEq/L (ref 3.5–5.1)
Sodium: 139 mEq/L (ref 135–145)
TCO2: 16 mmol/L (ref 0–100)

## 2012-04-23 LAB — CBC WITH DIFFERENTIAL/PLATELET
Basophils Relative: 0 % (ref 0–1)
Eosinophils Absolute: 0.1 10*3/uL (ref 0.0–0.7)
Eosinophils Relative: 1 % (ref 0–5)
HCT: 18.1 % — ABNORMAL LOW (ref 36.0–46.0)
Hemoglobin: 6.3 g/dL — CL (ref 12.0–15.0)
MCH: 28.5 pg (ref 26.0–34.0)
MCHC: 34.8 g/dL (ref 30.0–36.0)
MCV: 81.9 fL (ref 78.0–100.0)
Monocytes Absolute: 0.3 10*3/uL (ref 0.1–1.0)
Neutro Abs: 4 10*3/uL (ref 1.7–7.7)
Neutrophils Relative %: 81 % — ABNORMAL HIGH (ref 43–77)
RBC: 2.21 MIL/uL — ABNORMAL LOW (ref 3.87–5.11)

## 2012-04-23 LAB — PREPARE RBC (CROSSMATCH)

## 2012-04-23 MED ORDER — SODIUM CHLORIDE 0.9 % IV BOLUS (SEPSIS)
500.0000 mL | Freq: Once | INTRAVENOUS | Status: AC
  Administered 2012-04-23: 500 mL via INTRAVENOUS

## 2012-04-23 MED ORDER — SODIUM CHLORIDE 0.9 % IV SOLN: INTRAVENOUS | Status: DC

## 2012-04-23 NOTE — ED Notes (Signed)
Pt reports her hemoglobin is low and that is why she came to the ED. Pt denies any pain. Pt states she just feels weaker than normal. Pt in bed sleeping.

## 2012-04-23 NOTE — ED Notes (Signed)
Patient states she was here last week with weakness and was told her hgb was 7.6 and she was scheduled for  Transfusion tomorrow and thought it was today. C/o feeling weak. States she  Had a kidney pancreatis transplant in 2010 and had her pancreatis removed in 2012 Denies pain today

## 2012-04-23 NOTE — Progress Notes (Signed)
[name], is a 36 y.o. female,   MRN: [id number]  -  DOB - 04/01/1976  Outpatient Primary MD for the patient is Juline Patch, MD  in for    Chief Complaint  Patient presents with  . Weakness     Blood pressure 136/66, pulse 96, temperature 97.9 F (36.6 C), temperature source Oral, resp. rate 16, last menstrual period 03/06/2012, SpO2 100.00%.  Principal Problem:  *Anemia Active Problems:  HYPERTENSION  RENAL FAILURE, CHRONIC  History of renal transplant  DM (diabetes mellitus)  Fatigue     This a 36 year old female with past medical history of renal transplant, requiring multiple transfusions in the past, with the last hemoglobin of 9.1 back in 04/03/2012 after being hospitalized for symptomatic anemia and getting 2units PRBC's  comes in for fatigue and generalized weakness. Pt with history of renal transplant and subsequent chronic anemia has been getting weekly Aranesp injections at Short stay. She was seen there last week and sent to the ED for anemia, but asymptomatic then and so not transfused. Today she went to Short Stay but her appointment was actually for tomorrow, however, she has been feeling very weak, occasionally dizzy and nauseated. Denies any vomiting, some loose but nonbloody stools. No difficulty urinating.    Workup in ED yields Hg 4.8. No s/sx active bleeding. VSS, hemodynamically stable, afebrile, non-toxic

## 2012-04-23 NOTE — ED Notes (Signed)
Notified Dr. Bernette Mayers about abnormal Chem8 results

## 2012-04-23 NOTE — ED Provider Notes (Signed)
History     CSN: [id number]  Arrival date & time 04/23/12  4098   First MD Initiated Contact with Patient 04/23/12 1015      Chief Complaint  Patient presents with  . Weakness    (Consider location/radiation/quality/duration/timing/severity/associated sxs/prior treatment) HPI Pt with history of renal transplant and subsequent chronic anemia has been getting weekly Aranesp injections at Short stay. She was seen there last week and sent to the ED for anemia, but asymptomatic then and so not transfused. Today she went to Short Stay but her appointment was actually for tomorrow, however, she has been feeling very weak, occasionally dizzy and nauseated. Denies any vomiting, some loose but nonbloody stools. No difficulty urinating. She denies any fever.   Past Medical History  Diagnosis Date  . ESRD (end stage renal disease)     history of peritoneal and Hemodialysis, now s/p renal  transplant  . Renal transplant disorder 2010  . Ovarian tumor, malignant 2005    resected in [place]  . Diabetic retinopathy   . HTN (hypertension)     not well controlled  . 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 HLD (hyperlipidemia)   . CAP (community acquired pneumonia) 2010  . Diabetes mellitus     Past Surgical History  Procedure Date  . Tumor removal 2005  . Av fistula placement   . Eye surgery     right eye   . Kidney transplant     Family History  Problem Relation Age of Onset  . Diabetes Mother   . Liver cancer Father   . Cancer Father     History  Substance Use Topics  . Smoking status: Never Smoker   . Smokeless tobacco: Not on file  . Alcohol Use: No    OB History    Grav Para Term Preterm Abortions TAB SAB Ect Mult Living                  Review of Systems All other systems reviewed and are negative except as noted in HPI.   Allergies  Ace inhibitors  Home Medications    Current Outpatient Rx  Name Route Sig Dispense Refill  . AMLODIPINE BESYLATE 10 MG PO TABS Oral Take 10 mg by mouth daily.    . ASPIRIN EC 81 MG PO TBEC Oral Take 81 mg by mouth daily.    [name] ESCITALOPRAM OXALATE 10 MG PO TABS Oral Take 10 mg by mouth daily.    . FUROSEMIDE 40 MG PO TABS Oral Take 40 mg by mouth daily.    . INSULIN GLARGINE 100 UNIT/ML [place] SOLN Subcutaneous Inject 60 Units into the skin at bedtime.    [name] LABETALOL HCL 100 MG PO TABS Oral Take 100 mg by mouth 3 (three) times daily.    [name] METOCLOPRAMIDE HCL 5 MG PO TABS Oral Take 5 mg by mouth 4 (four) times daily.    [name] MYCOPHENOLATE MOFETIL 250 MG PO CAPS Oral Take 750 mg by mouth 2 (two) times daily.     [name] PREDNISONE 5 MG PO TABS Oral Take 20 mg by mouth daily.    [name] SIMVASTATIN 20 MG PO TABS Oral Take 20 mg by mouth every Monday, Wednesday, and Friday.    . SODIUM BICARBONATE 650 MG PO TABS Oral Take 650 mg by mouth 2 (two) times daily.    . SULFAMETHOXAZOLE-TMP DS 800-160 MG PO  TABS Oral Take 1 tablet by mouth every Monday, Wednesday, and Friday.    [name] TACROLIMUS 1 MG PO CAPS Oral Take 2-3 mg by mouth 2 (two) times daily. 3 capsules in the morning 2 capsules at night      BP 110/66  Pulse 96  Temp 97.8 F (36.6 C) (Oral)  Resp 16  SpO2 100%  LMP 03/06/2012  Physical Exam  Nursing note and vitals reviewed. Constitutional: She is oriented to person, place, and time. She appears well-developed and well-nourished.  HENT:  Head: Normocephalic and atraumatic.  Eyes: EOM are normal. Pupils are equal, round, and reactive to light.  Neck: Normal range of motion. Neck supple.  Cardiovascular: Normal rate, normal heart sounds and intact distal pulses.   Pulmonary/Chest: Effort normal and breath sounds normal.  Abdominal: Bowel sounds are normal. She exhibits no distension. There is no tenderness.  Musculoskeletal: Normal range of motion. She exhibits no edema and no tenderness.  Neurological: She is alert and oriented to  person, place, and time. She has normal strength. No cranial nerve deficit or sensory deficit.  Skin: Skin is warm and dry. No rash noted.  Psychiatric: She has a normal mood and affect.    ED Course  Procedures (including critical care time)  Labs Reviewed  CBC WITH DIFFERENTIAL - Abnormal; Notable for the following:    RBC 2.21 (*)     Hemoglobin 6.3 (*)     HCT 18.1 (*)     Neutrophils Relative 81 (*)     Lymphocytes Relative 11 (*)     Lymphs Abs 0.5 (*)     All other components within normal limits  TYPE AND SCREEN  PREPARE RBC (CROSSMATCH)   No results found.   No diagnosis found.    MDM  Hgb is lower now than last week and she is now symptomatic. Will begin type and cross/transfusion. Plan admission to complete transfusion.        Charles B. Bernette Mayers, MD 04/23/12 2119

## 2012-04-23 NOTE — ED Provider Notes (Signed)
  Physical Exam  BP 149/82  Pulse 86  Temp 98.5 F (36.9 C) (Oral)  Resp 18  SpO2 100%  LMP 03/06/2012  Physical Exam  ED Course  Procedures  MDM Patient is not willing to stay for admission. She'll be transfused a second unit and will followup tomorrow as planned.      Juliet Rude. Rubin Payor, MD 04/23/12 2216

## 2012-04-23 NOTE — Consult Note (Signed)
[facility]  PATIENT DETAILS Name: [name] Age: 36 y.o. Sex: female Date of Birth: 01-19-1976 Admit Date: 04/23/2012 AVW:UJWJ,XBJYNWG, MD Requesting MD: Bonnita Levan. Bernette Mayers, MD  Date of consultation: 04/23/12  REASON FOR CONSULTATION:  Anemia  Impression Principal Problem:  *Anemia-likely secondary to worsening chronic renal failure. No overt evidence of any GI or vaginal blood loss. Active Problems:  HYPERLIPIDEMIA  HYPERTENSION  RENAL FAILURE, CHRONIC  History of renal transplant  DM (diabetes mellitus)  Noncompliance to medications and followup  Recommendations  1. Patient refuses inpatient admission and evaluation for worsening anemia, she is insisting on being discharged home. She already has received 1 unit of PRBC here in the emergency room, I have already spoken with the RN and emergency room physician Dr. Rubin Payor, I have informed them of the patient's decision. I have suggested them to transfuse another unit of PRBC and subsequently discharge her home. I've also spoken with Dr. Elvis Coil patient's primary nephrologist, he is in agreement with this plan as well. 2. Patient will need followup with Dr. Clover Mealy already has a previously scheduled appointment on 05/05/2012. I have asked her to keep this appointment. 3. She should continue to followup at the short stay-for her Aranesp injection 4. Resume her usual medications on discharge   Code Status: Full code   Please contact me if I can be of any further assistance. Thank you for this consultation.  [facility] Pager [phone]  HPI: Patient is a 36 year old [name] with a past medical history of renal transplant on multiple immunosuppressants, apparent history of noncompliance with meds and followup, worsening anemia thought to be secondary to worsening chronic kidney disease, chronic kidney disease stage IV presented to the ED after she complained of fatigue. Apparently for  the past few months patient has been getting regular darbepoetin injections and has had multiple blood transfusions for worsening anemia. She presented to the emergency room today complaining of fatigue, she claims she was supposed to get 2 units of PRBC transfused tomorrow, however she thought it was today and presented to the short stay. Upon further evaluation in the emergency room, she was initially found to have a hemoglobin of 6.3 and the hospitalist service was asked to admit this patient. Upon my interview with the patient, she adamantly refused to be admitted, and was requesting to have another unit PRBC transfused and discharged from the emergency room itself. She denies any history of melena or menorrhagia. I also subsequently spoke with patient's primary nephrologist Dr. Elvis Coil, he did inform me that there is no evidence of iron deficiency, there is no evidence of any blood loss anemia, at this point it is thought that this anemia is secondary to worsening kidney function. He is agreeable with transfusing her in the emergency room and is discharging her from here. Patient denies any shortness of breath to me. She also denied any chest pain or lightheadedness.   ALLERGIES:   Allergies  Allergen Reactions  . Ace Inhibitors Other (See Comments)     cause cough    PAST MEDICAL HISTORY: Past Medical History  Diagnosis Date  . ESRD (end stage renal disease)     history of peritoneal and Hemodialysis, now s/p renal  transplant  . Renal transplant disorder 2010  . Ovarian tumor, malignant 2005    resected in [place]  . Diabetic retinopathy   . HTN (hypertension)     not well controlled  . TB (pulmonary tuberculosis) 2003, 2007  history of miliary TB partially treated in 2003, and then completely treated in 2007  . HLD (hyperlipidemia)   . CAP (community acquired pneumonia) 2010  . Diabetes mellitus     PAST SURGICAL HISTORY: Past Surgical History  Procedure Date  . Tumor  removal 2005  . Av fistula placement   . Eye surgery     right eye   . Kidney transplant     MEDICATIONS AT HOME: Prior to Admission medications   Medication Sig Start Date End Date Taking? Authorizing Provider  amLODipine (NORVASC) 10 MG tablet Take 10 mg by mouth daily.   Yes Historical Provider, MD  aspirin EC 81 MG tablet Take 81 mg by mouth daily.   Yes Historical Provider, MD  escitalopram (LEXAPRO) 10 MG tablet Take 10 mg by mouth daily.   Yes Historical Provider, MD  furosemide (LASIX) 40 MG tablet Take 40 mg by mouth daily.   Yes Historical Provider, MD  insulin glargine (LANTUS) 100 UNIT/ML injection Inject 60 Units into the skin at bedtime.   Yes Historical Provider, MD  labetalol (NORMODYNE) 100 MG tablet Take 100 mg by mouth 3 (three) times daily.   Yes Historical Provider, MD  metoCLOPramide (REGLAN) 5 MG tablet Take 5 mg by mouth 4 (four) times daily.   Yes Historical Provider, MD  mycophenolate (CELLCEPT) 250 MG capsule Take 750 mg by mouth 2 (two) times daily.    Yes Historical Provider, MD  predniSONE (DELTASONE) 5 MG tablet Take 20 mg by mouth daily.   Yes Historical Provider, MD  simvastatin (ZOCOR) 20 MG tablet Take 20 mg by mouth every Monday, Wednesday, and Friday.   Yes Historical Provider, MD  sodium bicarbonate 650 MG tablet Take 650 mg by mouth 2 (two) times daily.   Yes Historical Provider, MD  sulfamethoxazole-trimethoprim (BACTRIM DS) 800-160 MG per tablet Take 1 tablet by mouth every Monday, Wednesday, and Friday.   Yes Historical Provider, MD  tacrolimus (PROGRAF) 1 MG capsule Take 2-3 mg by mouth 2 (two) times daily. 3 capsules in the morning 2 capsules at night   Yes Historical Provider, MD    FAMILY HISTORY: Family History  Problem Relation Age of Onset  . Diabetes Mother   . Liver cancer Father   . Cancer Father     SOCIAL HISTORY:  reports that she has never smoked. She does not have any smokeless tobacco history on file. She reports that she  does not drink alcohol or use illicit drugs.  REVIEW OF SYSTEMS:  Constitutional:   No  weight loss, night sweats,  Fevers, chills, fatigue.  HEENT:    No headaches, Difficulty swallowing,Tooth/dental problems,Sore throat,  No sneezing, itching, ear ache, nasal congestion, post nasal drip,   Cardio-vascular: No chest pain,  Orthopnea, PND, swelling in lower extremities, anasarca dizziness, palpitations  GI:  No heartburn, indigestion, abdominal pain, nausea, vomiting, diarrhea, change in bowel habits, loss of appetite  Resp: No shortness of breath with exertion or at rest.  No excess mucus, no productive cough, No non-productive cough,  No coughing up of blood.No change in color of mucus.No wheezing.No chest wall deformity  Skin:  no rash or lesions.  GU:  no dysuria, change in color of urine, no urgency or frequency.  No flank pain.  Musculoskeletal: No joint pain or swelling.  No decreased range of motion.  No back pain.  Psych: No change in mood or affect. No depression or anxiety.  No memory loss.   PHYSICAL EXAM:  Blood pressure 141/68, pulse 84, temperature 98.2 F (36.8 C), temperature source Oral, resp. rate 20, last menstrual period 03/06/2012, SpO2 99.00%.  General appearance :Awake, alert, not in any distress. Speech Clear. Not toxic Looking HEENT: Atraumatic and Normocephalic, pupils equally reactive to light and accomodation Neck: supple, no JVD. No cervical lymphadenopathy.  Chest:Good air entry bilaterally, no added sounds  CVS: S1 S2 regular, no murmurs.  Abdomen: Bowel sounds present, Non tender and not distended with no gaurding, rigidity or rebound. Extremities: B/L Lower Ext shows no edema, both legs are warm to touch, with  dorsalis pedis pulses palpable. Neurology: Awake alert, and oriented X 3, CN II-XII intact, Non focal, Deep Tendon Reflex-2+ all over, plantar's downgoing B/L, sensory exam is grossly intact.  Skin:No Rash Wounds:N/A  LABS ON  ADMISSION:   Basename 04/23/12 1214  NA 139  K 3.7  CL 110  CO2 --  GLUCOSE 128*  BUN 64*  CREATININE 4.80*  CALCIUM --  MG --  PHOS --   No results found for this basename: AST:2,ALT:2,ALKPHOS:2,BILITOT:2,PROT:2,ALBUMIN:2 in the last 72 hours No results found for this basename: LIPASE:2,AMYLASE:2 in the last 72 hours  Basename 04/23/12 1214 04/23/12 1053  WBC -- 4.9  NEUTROABS -- 4.0  HGB 5.4* 6.3*  HCT 16.0* 18.1*  MCV -- 81.9  PLT -- 191   No results found for this basename: CKTOTAL:3,CKMB:3,CKMBINDEX:3,TROPONINI:3 in the last 72 hours No results found for this basename: DDIMER:2 in the last 72 hours No components found with this basename: POCBNP:3   RADIOLOGIC STUDIES ON ADMISSION: No results found.   Total time spent 45 minutes.  [facility] Pager [phone]  If 7PM-7AM, please contact night-coverage [email or web address] Password [email or web address] 04/23/2012, 5:30 PM

## 2012-04-24 ENCOUNTER — Encounter (HOSPITAL_COMMUNITY)
Admission: RE | Admit: 2012-04-24 | Discharge: 2012-04-24 | Disposition: A | Discharge status: Home / Self Care | Payer: Medicare Other | Source: Ambulatory Visit | Attending: Nephrology | Admitting: Nephrology

## 2012-04-24 LAB — TYPE AND SCREEN
Antibody Screen: NEGATIVE
Unit division: 0

## 2012-04-24 LAB — IRON AND TIBC: UIBC: 110 ug/dL — ABNORMAL LOW (ref 125–400)

## 2012-04-24 MED ORDER — DARBEPOETIN ALFA-POLYSORBATE 100 MCG/0.5ML IJ SOLN
100.0000 ug | INTRAMUSCULAR | Status: DC
  Administered 2012-04-24: 100 ug via SUBCUTANEOUS
  Filled 2012-04-24: qty 0.5

## 2012-05-01 ENCOUNTER — Encounter (HOSPITAL_COMMUNITY)
Admission: RE | Admit: 2012-05-01 | Discharge: 2012-05-01 | Disposition: A | Discharge status: Home / Self Care | Payer: Medicare Other | Source: Ambulatory Visit | Attending: Nephrology | Admitting: Nephrology

## 2012-05-01 LAB — POCT HEMOGLOBIN-HEMACUE: Hemoglobin: 8.3 g/dL — ABNORMAL LOW (ref 12.0–15.0)

## 2012-05-01 MED ORDER — DARBEPOETIN ALFA-POLYSORBATE 100 MCG/0.5ML IJ SOLN
INTRAMUSCULAR | Status: AC
  Administered 2012-05-01: 100 ug via SUBCUTANEOUS
  Filled 2012-05-01: qty 0.5

## 2012-05-01 MED ORDER — DARBEPOETIN ALFA-POLYSORBATE 100 MCG/0.5ML IJ SOLN
100.0000 ug | INTRAMUSCULAR | Status: DC
  Administered 2012-05-01: 100 ug via SUBCUTANEOUS

## 2012-05-08 ENCOUNTER — Encounter (HOSPITAL_COMMUNITY)
Admission: RE | Admit: 2012-05-08 | Discharge: 2012-05-08 | Disposition: A | Discharge status: Home / Self Care | Payer: BC Managed Care – PPO | Source: Ambulatory Visit | Attending: Nephrology | Admitting: Nephrology

## 2012-05-08 DIAGNOSIS — N183 Chronic kidney disease, stage 3 unspecified: Secondary | ICD-10-CM | POA: Insufficient documentation

## 2012-05-08 DIAGNOSIS — D638 Anemia in other chronic diseases classified elsewhere: Secondary | ICD-10-CM | POA: Insufficient documentation

## 2012-05-08 LAB — POCT HEMOGLOBIN-HEMACUE: Hemoglobin: 8.3 g/dL — ABNORMAL LOW (ref 12.0–15.0)

## 2012-05-08 MED ORDER — DARBEPOETIN ALFA-POLYSORBATE 100 MCG/0.5ML IJ SOLN
100.0000 ug | INTRAMUSCULAR | Status: DC
  Administered 2012-05-08: 100 ug via SUBCUTANEOUS
  Filled 2012-05-08: qty 0.5

## 2012-05-14 ENCOUNTER — Other Ambulatory Visit (HOSPITAL_COMMUNITY): Payer: Self-pay | Admitting: *Deleted

## 2012-05-15 ENCOUNTER — Encounter (HOSPITAL_COMMUNITY)
Admission: RE | Admit: 2012-05-15 | Discharge: 2012-05-15 | Disposition: A | Discharge status: Home / Self Care | Payer: BC Managed Care – PPO | Source: Ambulatory Visit | Attending: Nephrology | Admitting: Nephrology

## 2012-05-15 ENCOUNTER — Other Ambulatory Visit: Payer: Self-pay

## 2012-05-15 DIAGNOSIS — Z01810 Encounter for preprocedural cardiovascular examination: Secondary | ICD-10-CM

## 2012-05-15 DIAGNOSIS — N186 End stage renal disease: Secondary | ICD-10-CM

## 2012-05-15 LAB — RENAL FUNCTION PANEL
Albumin: 3.5 g/dL (ref 3.5–5.2)
GFR calc Af Amer: 14 mL/min — ABNORMAL LOW (ref >90)
GFR calc non Af Amer: 12 mL/min — ABNORMAL LOW (ref >90)
Glucose, Bld: 213 mg/dL — ABNORMAL HIGH (ref 70–99)
Phosphorus: 4.7 mg/dL — ABNORMAL HIGH (ref 2.3–4.6)
Potassium: 4 mEq/L (ref 3.5–5.1)
Sodium: 136 mEq/L (ref 135–145)

## 2012-05-15 MED ORDER — DARBEPOETIN ALFA-POLYSORBATE 100 MCG/0.5ML IJ SOLN
100.0000 ug | INTRAMUSCULAR | Status: DC
  Administered 2012-05-15: 100 ug via SUBCUTANEOUS

## 2012-05-15 MED ORDER — DARBEPOETIN ALFA-POLYSORBATE 100 MCG/0.5ML IJ SOLN
INTRAMUSCULAR | Status: AC
  Filled 2012-05-15: qty 0.5

## 2012-05-15 NOTE — Progress Notes (Signed)
Hemocue 6.9, repeated 7.3; Spoke with Gavin Pound at [facility] orders to continue with Aranesp injection and draw Renal panel today per Dr. Hyman Hopes

## 2012-05-19 LAB — POCT HEMOGLOBIN-HEMACUE: Hemoglobin: 7.3 g/dL — ABNORMAL LOW (ref 12.0–15.0)

## 2012-05-21 ENCOUNTER — Other Ambulatory Visit (HOSPITAL_COMMUNITY): Payer: Self-pay | Admitting: *Deleted

## 2012-05-22 ENCOUNTER — Emergency Department (HOSPITAL_COMMUNITY)
Admission: EM | Admit: 2012-05-22 | Discharge: 2012-05-22 | Disposition: A | Discharge status: Home / Self Care | Payer: Medicare Other | Attending: Emergency Medicine | Admitting: Emergency Medicine

## 2012-05-22 ENCOUNTER — Encounter (HOSPITAL_COMMUNITY): Payer: Self-pay | Admitting: Emergency Medicine

## 2012-05-22 ENCOUNTER — Encounter (HOSPITAL_COMMUNITY)
Admission: RE | Admit: 2012-05-22 | Discharge: 2012-05-22 | Disposition: A | Discharge status: Home / Self Care | Payer: BC Managed Care – PPO | Source: Ambulatory Visit | Attending: Nephrology | Admitting: Nephrology

## 2012-05-22 DIAGNOSIS — E1139 Type 2 diabetes mellitus with other diabetic ophthalmic complication: Secondary | ICD-10-CM | POA: Insufficient documentation

## 2012-05-22 DIAGNOSIS — Z79899 Other long term (current) drug therapy: Secondary | ICD-10-CM | POA: Insufficient documentation

## 2012-05-22 DIAGNOSIS — E11319 Type 2 diabetes mellitus with unspecified diabetic retinopathy without macular edema: Secondary | ICD-10-CM | POA: Insufficient documentation

## 2012-05-22 DIAGNOSIS — I120 Hypertensive chronic kidney disease with stage 5 chronic kidney disease or end stage renal disease: Secondary | ICD-10-CM | POA: Insufficient documentation

## 2012-05-22 DIAGNOSIS — D649 Anemia, unspecified: Secondary | ICD-10-CM | POA: Insufficient documentation

## 2012-05-22 DIAGNOSIS — N186 End stage renal disease: Secondary | ICD-10-CM | POA: Insufficient documentation

## 2012-05-22 DIAGNOSIS — Z992 Dependence on renal dialysis: Secondary | ICD-10-CM | POA: Insufficient documentation

## 2012-05-22 DIAGNOSIS — Z794 Long term (current) use of insulin: Secondary | ICD-10-CM | POA: Insufficient documentation

## 2012-05-22 DIAGNOSIS — E785 Hyperlipidemia, unspecified: Secondary | ICD-10-CM | POA: Insufficient documentation

## 2012-05-22 LAB — BASIC METABOLIC PANEL
BUN: 46 mg/dL — ABNORMAL HIGH (ref 6–23)
Chloride: 105 mEq/L (ref 96–112)
GFR calc Af Amer: 14 mL/min — ABNORMAL LOW (ref >90)
Glucose, Bld: 118 mg/dL — ABNORMAL HIGH (ref 70–99)
Potassium: 3.8 mEq/L (ref 3.5–5.1)
Sodium: 136 mEq/L (ref 135–145)

## 2012-05-22 LAB — RENAL FUNCTION PANEL
CO2: 16 mEq/L — ABNORMAL LOW (ref 19–32)
Chloride: 105 mEq/L (ref 96–112)
GFR calc Af Amer: 13 mL/min — ABNORMAL LOW (ref >90)
Glucose, Bld: 107 mg/dL — ABNORMAL HIGH (ref 70–99)
Potassium: 3.5 mEq/L (ref 3.5–5.1)
Sodium: 136 mEq/L (ref 135–145)

## 2012-05-22 LAB — CBC WITH DIFFERENTIAL/PLATELET
Basophils Absolute: 0 10*3/uL (ref 0.0–0.1)
Basophils Relative: 0 % (ref 0–1)
HCT: 16.4 % — ABNORMAL LOW (ref 36.0–46.0)
Hemoglobin: 5.9 g/dL — CL (ref 12.0–15.0)
Lymphocytes Relative: 10 % — ABNORMAL LOW (ref 12–46)
Monocytes Relative: 4 % (ref 3–12)
Neutro Abs: 4.6 10*3/uL (ref 1.7–7.7)
Neutrophils Relative %: 85 % — ABNORMAL HIGH (ref 43–77)
WBC: 5.5 10*3/uL (ref 4.0–10.5)

## 2012-05-22 MED ORDER — DARBEPOETIN ALFA-POLYSORBATE 100 MCG/0.5ML IJ SOLN
100.0000 ug | INTRAMUSCULAR | Status: DC
  Administered 2012-05-22: 100 ug via SUBCUTANEOUS
  Filled 2012-05-22: qty 0.5

## 2012-05-22 NOTE — ED Notes (Signed)
Critical Lab-HgB 5.9.

## 2012-05-22 NOTE — ED Notes (Signed)
First unit of PRBC's continues to infuse without any problem.  Pt resting with eyes closed.  No complaints voiced.

## 2012-05-22 NOTE — ED Notes (Signed)
2nd unit PRBC infusion complete.  Pt at [place] sandwich and snacks.  St's she is ready to go home.  PA made aware

## 2012-05-22 NOTE — ED Notes (Signed)
Pt sent from [facility] short Stay due to low HgB.  Tested PTA.

## 2012-05-22 NOTE — ED Provider Notes (Signed)
History     CSN: [id number]  Arrival date & time 05/22/12  4098   First MD Initiated Contact with Patient 05/22/12 1113      Chief Complaint  Patient presents with  . Anemia    (Consider location/radiation/quality/duration/timing/severity/associated sxs/prior treatment) HPI Comments: [name] 36 y.o. female   The chief complaint is: Patient presents with:   Anemia   The patient has medical history significant for:   Past Medical History:   ESRD (end stage renal disease)                                 Comment:history of peritoneal and Hemodialysis, now s/p              renal  transplant   Renal transplant disorder                       2010         Ovarian tumor, malignant                        2005           Comment:resected in [place]   Diabetic retinopathy                                         HTN (hypertension)                                             Comment:not well controlled   TB (pulmonary tuberculosis)                     2003, 2007     Comment:history of miliary TB partially treated in               2003, and then completely treated in 2007   HLD (hyperlipidemia)                                         CAP (community acquired pneumonia)              2010         Diabetes mellitus                                           Patient presented from short say stating that she was told her hemoglobin was 6.1. States that she feels very fatigued. Denies fever or chills. Denies CP, palpitations or SOB. Denies NVD or abdominal pain.     Patient is a 36 y.o. female presenting with anemia. The history is provided by the patient.  Anemia Associated symptoms include fatigue. Pertinent negatives include no abdominal pain, chest pain, chills, fever, nausea or vomiting.    Past Medical History  Diagnosis Date  . ESRD (end stage renal disease)     history of peritoneal and Hemodialysis, now s/p renal  transplant  . Renal transplant disorder 2010  . Ovarian tumor,  malignant 2005  resected in [place]  . Diabetic retinopathy   . HTN (hypertension)     not well controlled  . 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 HLD (hyperlipidemia)   . CAP (community acquired pneumonia) 2010  . Diabetes mellitus     Past Surgical History  Procedure Date  . Tumor removal 2005  . Av fistula placement   . Eye surgery     right eye   . Kidney transplant     Family History  Problem Relation Age of Onset  . Diabetes Mother   . Liver cancer Father   . Cancer Father     History  Substance Use Topics  . Smoking status: Never Smoker   . Smokeless tobacco: Not on file  . Alcohol Use: No    OB History    Grav Para Term Preterm Abortions TAB SAB Ect Mult Living                  Review of Systems  Constitutional: Positive for fatigue. Negative for fever and chills.  Respiratory: Negative for shortness of breath.   Cardiovascular: Negative for chest pain and palpitations.  Gastrointestinal: Negative for nausea, vomiting, abdominal pain and diarrhea.  All other systems reviewed and are negative.    Allergies  Ace inhibitors  Home Medications   Current Outpatient Rx  Name Route Sig Dispense Refill  . AMLODIPINE BESYLATE 10 MG PO TABS Oral Take 10 mg by mouth daily.    . ASPIRIN EC 81 MG PO TBEC Oral Take 81 mg by mouth daily.    [name] ESCITALOPRAM OXALATE 10 MG PO TABS Oral Take 10 mg by mouth daily.    . FUROSEMIDE 40 MG PO TABS Oral Take 40 mg by mouth daily.    . INSULIN GLARGINE 100 UNIT/ML [place] SOLN Subcutaneous Inject 60 Units into the skin at bedtime.    [name] LABETALOL HCL 100 MG PO TABS Oral Take 100 mg by mouth 3 (three) times daily.    [name] METOCLOPRAMIDE HCL 5 MG PO TABS Oral Take 5 mg by mouth 4 (four) times daily.    [name] MYCOPHENOLATE MOFETIL 250 MG PO CAPS Oral Take 750 mg by mouth 2 (two) times daily.     [name] PREDNISONE 5 MG PO TABS Oral Take 20 mg by mouth  daily.    [name] SIMVASTATIN 20 MG PO TABS Oral Take 20 mg by mouth every Monday, Wednesday, and Friday.    . SODIUM BICARBONATE 650 MG PO TABS Oral Take 650 mg by mouth 2 (two) times daily.    Lynnda Child DS 800-160 MG PO TABS Oral Take 1 tablet by mouth every Monday, Wednesday, and Friday.    [name] TACROLIMUS 1 MG PO CAPS Oral Take 2-3 mg by mouth 2 (two) times daily. 3 capsules in the morning 2 capsules at night      BP 125/57  Pulse 83  Temp 98.2 F (36.8 C) (Oral)  Resp 18  SpO2 99%  Physical Exam  Constitutional: She appears well-developed and well-nourished. No distress.  HENT:  Head: Normocephalic and atraumatic.  Mouth/Throat: Oropharynx is clear and moist.  Eyes: Conjunctivae and EOM are normal. Pupils are equal, round, and reactive to light. No scleral icterus.  Neck: Normal range of motion. Neck supple.  Cardiovascular: Normal rate, regular rhythm and normal heart sounds.   Pulmonary/Chest: Effort normal and breath sounds normal.  Abdominal:  Soft. Bowel sounds are normal. There is no tenderness.  Skin: Skin is warm and dry.    ED Course  Procedures (including critical care time)  Labs Reviewed  CBC WITH DIFFERENTIAL - Abnormal; Notable for the following:    RBC 1.98 (*)     Hemoglobin 5.9 (*)     HCT 16.4 (*)     Neutrophils Relative 85 (*)     Lymphocytes Relative 10 (*)     Lymphs Abs 0.6 (*)     All other components within normal limits  BASIC METABOLIC PANEL - Abnormal; Notable for the following:    CO2 15 (*)     Glucose, Bld 118 (*)     BUN 46 (*)     Creatinine, Ser 4.50 (*)     Calcium 8.2 (*)     GFR calc non Af Amer 12 (*)     GFR calc Af Amer 14 (*)     All other components within normal limits  TYPE AND SCREEN  PREPARE RBC (CROSSMATCH)   Results for orders placed during the hospital encounter of 05/22/12  CBC WITH DIFFERENTIAL      Component Value Range   WBC 5.5  4.0 - 10.5 K/uL   RBC 1.98 (*) 3.87 - 5.11 MIL/uL   Hemoglobin 5.9 (*)  12.0 - 15.0 g/dL   HCT 16.1 (*) 09.6 - 04.5 %   MCV 82.8  78.0 - 100.0 fL   MCH 29.8  26.0 - 34.0 pg   MCHC 36.0  30.0 - 36.0 g/dL   RDW 40.9  81.1 - 91.4 %   Platelets 195  150 - 400 K/uL   Neutrophils Relative 85 (*) 43 - 77 %   Lymphocytes Relative 10 (*) 12 - 46 %   Monocytes Relative 4  3 - 12 %   Eosinophils Relative 1  0 - 5 %   Basophils Relative 0  0 - 1 %   Neutro Abs 4.6  1.7 - 7.7 K/uL   Lymphs Abs 0.6 (*) 0.7 - 4.0 K/uL   Monocytes Absolute 0.2  0.1 - 1.0 K/uL   Eosinophils Absolute 0.1  0.0 - 0.7 K/uL   Basophils Absolute 0.0  0.0 - 0.1 K/uL   RBC Morphology TEARDROP CELLS    BASIC METABOLIC PANEL      Component Value Range   Sodium 136  135 - 145 mEq/L   Potassium 3.8  3.5 - 5.1 mEq/L   Chloride 105  96 - 112 mEq/L   CO2 15 (*) 19 - 32 mEq/L   Glucose, Bld 118 (*) 70 - 99 mg/dL   BUN 46 (*) 6 - 23 mg/dL   Creatinine, Ser 7.82 (*) 0.50 - 1.10 mg/dL   Calcium 8.2 (*) 8.4 - 10.5 mg/dL   GFR calc non Af Amer 12 (*) >90 mL/min   GFR calc Af Amer 14 (*) >90 mL/min  TYPE AND SCREEN      Component Value Range   ABO/RH(D) AB POS     Antibody Screen NEG     Sample Expiration 05/25/2012     Unit Number [id number]     Blood Component Type RED CELLS,LR     Unit division 00     Status of Unit ISSUED     Transfusion Status OK TO TRANSFUSE     Crossmatch Result Compatible     Unit Number [id number]     Blood Component Type RED CELLS,LR     Unit division  00     Status of Unit ALLOCATED     Transfusion Status OK TO TRANSFUSE     Crossmatch Result Compatible    PREPARE RBC (CROSSMATCH)      Component Value Range   Order Confirmation ORDER PROCESSED BY BLOOD BANK     vi  No results found.   1. Anemia       MDM  Patient presented for transfusion due to long standing chronic anemia. At presentation from short stay she was told her hemoglobin was 6.1. CBC: remarkable for hemoglobin of 5.9. Blood bank testing performed and 2 units of blood orders and  transfusion started. CMP: creatinine of 4.50 consistent with history of chronic kidney disease. Patient care will be assumed by Arthor Captain, PA-C.         Pixie Casino, PA-C 05/22/12 1519

## 2012-05-22 NOTE — ED Provider Notes (Signed)
  Physical Exam  BP 143/71  Pulse 86  Temp 98.2 F (36.8 C) (Oral)  Resp 18  SpO2 98%  3:26 PM BP 143/71  Pulse 86  Temp 98.2 F (36.8 C) (Oral)  Resp 18  SpO2 98% Care of patient from Tia oliveri,pa-c. The patient has past medical history of end-stage renal disease, status post kidney transplant, diabetes mellitus, and hypertension. She was sent here today from short stay. Her hemoglobin was evaluated at 6.1. Her hemoglobin was reevaluated here at 5.9. Creatinine is 4.5 which is at baseline for her. She is receiving 2 units of blood currently. Patient is resting comfortably. Denies any melena, hematochezia, or hematemesis.  Plan: Patient will receive 2 units of blood. She is to followup tomorrow as planned. I will make sure patient is hemodynamically stable and able to ambulate without dizziness.  8:27 PM BP 154/76  Pulse 88  Temp 97.7 F (36.5 C) (Oral)  Resp 20  SpO2 99% Patient finished with transfusion. She is eating and able to ambulate without dizziness. No signs of transfusion reactions. I will D/C and have her follow up with Dr. Hyman Hopes. [name]  Physical Exam CV: RRR, No M/R/G, Peripheral pulses intact. No peripheral edema. Lungs: CTAB Abd: Soft, Non tender, non distended ED Course  Procedures  MDM D/C patient home with OP follow up. Discussed reasons to seek immediate care. Patient expresses understanding and agrees with plan.       Arthor Captain, PA-C 05/22/12 2035

## 2012-05-23 LAB — TYPE AND SCREEN: Antibody Screen: NEGATIVE

## 2012-05-23 NOTE — ED Provider Notes (Signed)
Medical screening examination/treatment/procedure(s) were conducted as a shared visit with non-physician practitioner(s) and myself.  I personally evaluated the patient during the encounter  Flint Melter, MD 05/23/12 [phone]

## 2012-05-23 NOTE — ED Provider Notes (Signed)
Medical screening examination/treatment/procedure(s) were performed by non-physician practitioner and as supervising physician I was immediately available for consultation/collaboration.  Derwood Kaplan, MD 05/23/12 0104

## 2012-05-27 ENCOUNTER — Encounter: Payer: Self-pay | Admitting: Vascular Surgery

## 2012-05-28 ENCOUNTER — Ambulatory Visit (INDEPENDENT_AMBULATORY_CARE_PROVIDER_SITE_OTHER): Payer: Medicare Other | Admitting: Vascular Surgery

## 2012-05-28 ENCOUNTER — Encounter: Payer: Self-pay | Admitting: Vascular Surgery

## 2012-05-28 VITALS — BP 159/78 | HR 85 | Resp 16 | Ht 67.0 in | Wt 176.0 lb

## 2012-05-28 DIAGNOSIS — Z01810 Encounter for preprocedural cardiovascular examination: Secondary | ICD-10-CM

## 2012-05-28 DIAGNOSIS — N186 End stage renal disease: Secondary | ICD-10-CM

## 2012-05-28 DIAGNOSIS — N184 Chronic kidney disease, stage 4 (severe): Secondary | ICD-10-CM

## 2012-05-28 NOTE — Progress Notes (Signed)
[facility] [place] HISTORY AND PHYSICAL   History of Present Illness:  Patient is a 36 y.o. year old female who presents for placement of a permanent hemodialysis access. The patient is right handed .  The patient is not currently on hemodialysis.  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 She previously had a left radiocephalic AV fistula which did not mature. She subsequently had a left forearm AV graft which lasted for years.  Other chronic medical problems include diabetes, hypertension, retinopathy.  These are all currently controlled and followed by Dr. Webb  Past Medical History  Diagnosis Date  . ESRD (end stage renal disease)     history of peritoneal and Hemodialysis, now s/p renal  transplant  . Renal transplant disorder 2010  . Ovarian tumor, malignant 2005    resected in [place]  . Diabetic retinopathy   . HTN (hypertension)     not well controlled  . 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 HLD (hyperlipidemia)   . CAP (community acquired pneumonia) 2010  . Diabetes mellitus     Past Surgical History  Procedure Date  . Tumor removal 2005  . Av fistula placement   . Eye surgery     right eye   . Kidney transplant      Social History History  Substance Use Topics  . Smoking status: Never Smoker   . Smokeless tobacco: Never Used  . Alcohol Use: No    Family History Family History  Problem Relation Age of Onset  . Diabetes Mother   . Liver cancer Father   . Cancer Father     Allergies  Allergies  Allergen Reactions  . Ace Inhibitors Other (See Comments)     cause cough     Current Outpatient Prescriptions  Medication Sig Dispense Refill  . amLODipine (NORVASC) 10 MG tablet Take 10 mg by mouth daily.      . aspirin EC 81 MG tablet Take 81 mg by mouth daily.      . escitalopram (LEXAPRO) 10 MG tablet Take 10 mg  by mouth daily.      . furosemide (LASIX) 40 MG tablet Take 40 mg by mouth daily.      . insulin glargine (LANTUS) 100 UNIT/ML injection Inject 60 Units into the skin at bedtime.      . labetalol (NORMODYNE) 100 MG tablet Take 100 mg by mouth 3 (three) times daily.      . metoCLOPramide (REGLAN) 5 MG tablet Take 5 mg by mouth 4 (four) times daily.      . mycophenolate (CELLCEPT) 250 MG capsule Take 750 mg by mouth 2 (two) times daily.       . omeprazole (PRILOSEC) 20 MG capsule Take 20 mg by mouth daily.      . predniSONE (DELTASONE) 5 MG tablet Take 20 mg by mouth daily.      . simvastatin (ZOCOR) 20 MG tablet Take 20 mg by mouth every Monday, Wednesday, and Friday.      . sodium bicarbonate 650 MG tablet Take 650 mg by mouth 2 (two) times daily.      . sulfamethoxazole-trimethoprim (BACTRIM DS) 800-160 MG per tablet Take 1 tablet by mouth every Monday, Wednesday, and Friday.      . tacrolimus (PROGRAF) 1 MG capsule Take 2-3 mg by mouth 2 (two) times daily. 3 capsules   in the morning 2 capsules at night        ROS:   General:  No weight loss, Fever, chills  HEENT: No recent headaches, no nasal bleeding, no visual changes, no sore throat  Neurologic: No dizziness, blackouts, seizures. No recent symptoms of stroke or mini- stroke. No recent episodes of slurred speech, or temporary blindness.  Cardiac: No recent episodes of chest pain/pressure, no shortness of breath at rest.  No shortness of breath with exertion.  Denies history of atrial fibrillation or irregular heartbeat  Vascular: No history of rest pain in feet.  No history of claudication.  No history of non-healing ulcer, No history of DVT   Pulmonary: No home oxygen, no productive cough, no hemoptysis,  No asthma or wheezing  Musculoskeletal:  [ ] Arthritis, [ ] Low back pain,  [ ] Joint pain  Hematologic:No history of hypercoagulable state.  No history of easy bleeding.  No history of anemia  Gastrointestinal: No hematochezia  or melena,  No gastroesophageal reflux, no trouble swallowing  Urinary: [x ] chronic Kidney disease, [ ] on HD - [ ] MWF or [ ] TTHS, [ ] Burning with urination, [ ] Frequent urination, [ ] Difficulty urinating;   Skin: No rashes  Psychological: No history of anxiety,  No history of depression   Physical Examination  Filed Vitals:   05/28/12 1136  BP: 159/78  Pulse: 85  Resp: 16  Height: 5' 7" (1.702 m)  Weight: 176 lb (79.833 kg)  SpO2: 100%    Body mass index is 27.57 kg/(m^2).  General:  Alert and oriented, no acute distress HEENT: Normal Neck: No bruit or JVD Pulmonary: Clear to auscultation bilaterally Cardiac: Regular Rate and Rhythm without murmur Gastrointestinal: Soft, non-tender, non-distended, no mass, no scars Skin: No rash Extremity Pulses:  2+ radial, brachial pulses bilaterally, thrombosed left forearm graft as well as left radiocephalic fistula Musculoskeletal: No deformity or edema  Neurologic: Upper and lower extremity motor 5/5 and symmetric   ASSESSMENT:   Needs hemodialysis access.   PLAN:  Left upper arm AV graft on 06/03/2012. Risks benefits possible complications and procedure details were explained the patient today. I also discussed with the patient the possibility of placing a right arm AV fistula. However this point she wishes to keep her access in the left arm.    Charles Fields, MD [facility] [place] Office: [phone] Pager: [phone]  

## 2012-05-29 ENCOUNTER — Other Ambulatory Visit: Payer: Self-pay

## 2012-05-29 ENCOUNTER — Encounter (HOSPITAL_COMMUNITY): Payer: Self-pay | Admitting: Pharmacy Technician

## 2012-05-29 ENCOUNTER — Encounter (HOSPITAL_COMMUNITY): Payer: BC Managed Care – PPO

## 2012-06-01 NOTE — Pre-Procedure Instructions (Signed)
20 [name]  06/01/2012  Your procedure is scheduled on: Wednesday, August 28th  Report to [facility] at 7:300 AM.  Call this number if you have problems the morning of surgery: [phone]   Remember:   Do not eat food or drink any liquid:After Midnight.    Clear liquids include soda, tea, black coffee, apple or grape juice, broth.  Take these medicines the morning of surgery with A SIP OF WATER:Amlodipine (Norvasc), Escitalopram (Lexapro), Labetalol (Normodyne), Metoclopramide (Reglan), Mycophenolate (Cellcept), Omeprazole (Prilosec), Sulfamethoxazole- trimethoxazole (Bactrium), Tacrolimus (Prograf).   Do not wear jewelry, make-up or nail polish.  Do not wear lotions, powders, or perfumes. You may wear deodorant.  Do not shave 48 hours prior to surgery. Men may shave face and neck.  Do not bring valuables to the hospital.  Contacts, dentures or bridgework may not be worn into surgery.  Leave suitcase in the car. After surgery it may be brought to your room.  For patients admitted to the hospital, checkout time is 11:00 AM the day of discharge.   Patients discharged the day of surgery will not be allowed to drive home.  Name and phone number of your driver: ____________  Special Instructions: CHG Shower Use Special Wash: 1/2 bottle night before surgery and 1/2 bottle morning of surgery.   Please read over the following fact sheets that you were given: Pain Booklet, Coughing and Deep Breathing and Surgical Site Infection Prevention

## 2012-06-02 ENCOUNTER — Encounter (HOSPITAL_COMMUNITY)
Admission: RE | Admit: 2012-06-02 | Discharge: 2012-06-02 | Disposition: A | Discharge status: Home / Self Care | Payer: Medicare Other | Source: Ambulatory Visit | Attending: Vascular Surgery | Admitting: Vascular Surgery

## 2012-06-02 ENCOUNTER — Telehealth: Payer: Self-pay | Admitting: *Deleted

## 2012-06-02 ENCOUNTER — Encounter (HOSPITAL_COMMUNITY): Payer: Self-pay

## 2012-06-02 LAB — BASIC METABOLIC PANEL
BUN: 64 mg/dL — ABNORMAL HIGH (ref 6–23)
CO2: 16 mEq/L — ABNORMAL LOW (ref 19–32)
Calcium: 8.5 mg/dL (ref 8.4–10.5)
Creatinine, Ser: 5.39 mg/dL — ABNORMAL HIGH (ref 0.50–1.10)
Glucose, Bld: 190 mg/dL — ABNORMAL HIGH (ref 70–99)

## 2012-06-02 LAB — CBC
Hemoglobin: 6.5 g/dL — CL (ref 12.0–15.0)
MCH: 28.4 pg (ref 26.0–34.0)
MCV: 81.7 fL (ref 78.0–100.0)
RBC: 2.29 MIL/uL — ABNORMAL LOW (ref 3.87–5.11)

## 2012-06-02 MED ORDER — SODIUM CHLORIDE 0.9 % IV SOLN
INTRAVENOUS | Status: DC
  Administered 2012-06-03: 35 mL/h via INTRAVENOUS

## 2012-06-02 MED ORDER — CEFAZOLIN SODIUM-DEXTROSE 2-3 GM-% IV SOLR
2.0000 g | Freq: Once | INTRAVENOUS | Status: AC
  Administered 2012-06-03: 2 g via INTRAVENOUS
  Filled 2012-06-02: qty 50

## 2012-06-02 NOTE — Telephone Encounter (Signed)
Ms [name] called to say Ms [name]'s HGB was 6.5 but on 8/16 it was 5.9. I called Dr Darrick Penna and he said to call her nephrologist. I called Dr Marland Mcalpine office and spoke with [place]. She said they have been having problems with her HBG but Dr Hyman Hopes says to go ahead with planned graft insertion.

## 2012-06-02 NOTE — Progress Notes (Signed)
Contacted Dr. Michelle Piper, notified of hgb 6.5 and that surgeon and nephrologist have cleared procedure.  Dr. Michelle Piper agrees ok to proceed.

## 2012-06-02 NOTE — Progress Notes (Signed)
Notified anesthesia: Revonda Standard of abnormal lab, Hgb 6.5, also called left voice message for Darel Hong with Dr. Darrick Penna office of patients abnormal Hgb, requested their office also contact [facility] and notify patients nephrologist of Hgb level.

## 2012-06-02 NOTE — Progress Notes (Signed)
Spoke with [name] with Dr. Darrick Penna office, states Dr. Darrick Penna and Dr. Hyman Hopes states ok to proceed with surgery scheduled on 06/03/12.

## 2012-06-03 ENCOUNTER — Ambulatory Visit (HOSPITAL_COMMUNITY): Payer: Medicare Other | Admitting: Vascular Surgery

## 2012-06-03 ENCOUNTER — Encounter (HOSPITAL_COMMUNITY)
Admission: RE | Disposition: A | Discharge status: Home / Self Care | Payer: Self-pay | Source: Ambulatory Visit | Attending: Vascular Surgery

## 2012-06-03 ENCOUNTER — Telehealth: Payer: Self-pay | Admitting: Vascular Surgery

## 2012-06-03 ENCOUNTER — Encounter (HOSPITAL_COMMUNITY): Payer: Self-pay | Admitting: Vascular Surgery

## 2012-06-03 ENCOUNTER — Ambulatory Visit (HOSPITAL_COMMUNITY)
Admission: RE | Admit: 2012-06-03 | Discharge: 2012-06-03 | Disposition: A | Discharge status: Home / Self Care | Payer: Medicare Other | Source: Ambulatory Visit | Attending: Vascular Surgery | Admitting: Vascular Surgery

## 2012-06-03 ENCOUNTER — Encounter (HOSPITAL_COMMUNITY): Payer: Self-pay | Admitting: *Deleted

## 2012-06-03 DIAGNOSIS — N186 End stage renal disease: Secondary | ICD-10-CM | POA: Insufficient documentation

## 2012-06-03 DIAGNOSIS — Z794 Long term (current) use of insulin: Secondary | ICD-10-CM | POA: Insufficient documentation

## 2012-06-03 DIAGNOSIS — E1139 Type 2 diabetes mellitus with other diabetic ophthalmic complication: Secondary | ICD-10-CM | POA: Insufficient documentation

## 2012-06-03 DIAGNOSIS — Z940 Kidney transplant status: Secondary | ICD-10-CM | POA: Insufficient documentation

## 2012-06-03 DIAGNOSIS — E785 Hyperlipidemia, unspecified: Secondary | ICD-10-CM | POA: Insufficient documentation

## 2012-06-03 DIAGNOSIS — Z8611 Personal history of tuberculosis: Secondary | ICD-10-CM | POA: Insufficient documentation

## 2012-06-03 DIAGNOSIS — Z7902 Long term (current) use of antithrombotics/antiplatelets: Secondary | ICD-10-CM | POA: Insufficient documentation

## 2012-06-03 DIAGNOSIS — E11319 Type 2 diabetes mellitus with unspecified diabetic retinopathy without macular edema: Secondary | ICD-10-CM | POA: Insufficient documentation

## 2012-06-03 DIAGNOSIS — Z79899 Other long term (current) drug therapy: Secondary | ICD-10-CM | POA: Insufficient documentation

## 2012-06-03 DIAGNOSIS — I120 Hypertensive chronic kidney disease with stage 5 chronic kidney disease or end stage renal disease: Secondary | ICD-10-CM | POA: Insufficient documentation

## 2012-06-03 HISTORY — PX: AV FISTULA PLACEMENT: SHX1204

## 2012-06-03 LAB — CBC
MCHC: 34.7 g/dL (ref 30.0–36.0)
MCV: 82 fL (ref 78.0–100.0)
Platelets: 210 10*3/uL (ref 150–400)
RDW: 13.8 % (ref 11.5–15.5)
WBC: 5.1 10*3/uL (ref 4.0–10.5)

## 2012-06-03 LAB — POCT I-STAT 4, (NA,K, GLUC, HGB,HCT)
Glucose, Bld: 91 mg/dL (ref 70–99)
HCT: 19 % — ABNORMAL LOW (ref 36.0–46.0)
Potassium: 3.5 mEq/L (ref 3.5–5.1)
Sodium: 142 mEq/L (ref 135–145)

## 2012-06-03 LAB — TYPE AND SCREEN

## 2012-06-03 LAB — GLUCOSE, CAPILLARY
Glucose-Capillary: 103 mg/dL — ABNORMAL HIGH (ref 70–99)
Glucose-Capillary: 118 mg/dL — ABNORMAL HIGH (ref 70–99)

## 2012-06-03 SURGERY — INSERTION OF ARTERIOVENOUS (AV) GORE-TEX GRAFT ARM
Anesthesia: Monitor Anesthesia Care | Site: Arm Upper | Laterality: Left | Wound class: Clean

## 2012-06-03 MED ORDER — SODIUM CHLORIDE 0.9 % IV SOLN
INTRAVENOUS | Status: DC | PRN
  Administered 2012-06-03: 10:00:00 via INTRAVENOUS

## 2012-06-03 MED ORDER — ONDANSETRON HCL 4 MG/2ML IJ SOLN
INTRAMUSCULAR | Status: DC | PRN
  Administered 2012-06-03: 4 mg via INTRAVENOUS

## 2012-06-03 MED ORDER — LIDOCAINE-EPINEPHRINE 0.5 %-1:200000 IJ SOLN
INTRAMUSCULAR | Status: DC | PRN
  Administered 2012-06-03: 15 mL

## 2012-06-03 MED ORDER — DROPERIDOL 2.5 MG/ML IJ SOLN
INTRAMUSCULAR | Status: DC | PRN
  Administered 2012-06-03: 0.625 mg via INTRAVENOUS

## 2012-06-03 MED ORDER — MIDAZOLAM HCL 5 MG/5ML IJ SOLN
INTRAMUSCULAR | Status: DC | PRN
  Administered 2012-06-03: 2 mg via INTRAVENOUS

## 2012-06-03 MED ORDER — LIDOCAINE-EPINEPHRINE 0.5 %-1:200000 IJ SOLN
INTRAMUSCULAR | Status: AC
  Filled 2012-06-03: qty 1

## 2012-06-03 MED ORDER — PROMETHAZINE HCL 25 MG/ML IJ SOLN: 6.2500 mg | INTRAMUSCULAR | Status: DC | PRN

## 2012-06-03 MED ORDER — OXYCODONE HCL 5 MG PO TABS: 5.0000 mg | ORAL_TABLET | ORAL | Status: DC | PRN

## 2012-06-03 MED ORDER — LIDOCAINE HCL (CARDIAC) 20 MG/ML IV SOLN
INTRAVENOUS | Status: DC | PRN
  Administered 2012-06-03: 50 mg via INTRAVENOUS

## 2012-06-03 MED ORDER — SODIUM CHLORIDE 0.9 % IR SOLN
Status: DC | PRN
  Administered 2012-06-03: 12:00:00

## 2012-06-03 MED ORDER — PROPOFOL INFUSION 10 MG/ML OPTIME
INTRAVENOUS | Status: DC | PRN
  Administered 2012-06-03: 25 ug/kg/min via INTRAVENOUS

## 2012-06-03 MED ORDER — FENTANYL CITRATE 0.05 MG/ML IJ SOLN: 25.0000 ug | INTRAMUSCULAR | Status: DC | PRN

## 2012-06-03 MED ORDER — 0.9 % SODIUM CHLORIDE (POUR BTL) OPTIME
TOPICAL | Status: DC | PRN
  Administered 2012-06-03: 200 mL

## 2012-06-03 SURGICAL SUPPLY — 37 items
BENZOIN TINCTURE PRP APPL 2/3 (GAUZE/BANDAGES/DRESSINGS) ×2 IMPLANT
CANISTER SUCTION 2500CC (MISCELLANEOUS) ×2 IMPLANT
CLIP LIGATING EXTRA MED SLVR (CLIP) ×2 IMPLANT
CLIP LIGATING EXTRA SM BLUE (MISCELLANEOUS) ×2 IMPLANT
CLOTH BEACON ORANGE TIMEOUT ST (SAFETY) ×2 IMPLANT
COVER SURGICAL LIGHT HANDLE (MISCELLANEOUS) ×2 IMPLANT
DECANTER SPIKE VIAL GLASS SM (MISCELLANEOUS) ×2 IMPLANT
DERMABOND ADVANCED (GAUZE/BANDAGES/DRESSINGS) ×1
DERMABOND ADVANCED .7 DNX12 (GAUZE/BANDAGES/DRESSINGS) ×1 IMPLANT
ELECT REM PT RETURN 9FT ADLT (ELECTROSURGICAL) ×2
ELECTRODE REM PT RTRN 9FT ADLT (ELECTROSURGICAL) ×1 IMPLANT
GAUZE SPONGE 2X2 8PLY STRL LF (GAUZE/BANDAGES/DRESSINGS) ×1 IMPLANT
GEL ULTRASOUND 20GR AQUASONIC (MISCELLANEOUS) IMPLANT
GLOVE BIO SURGEON STRL SZ 6.5 (GLOVE) ×4 IMPLANT
GLOVE BIOGEL PI IND STRL 6.5 (GLOVE) ×2 IMPLANT
GLOVE BIOGEL PI INDICATOR 6.5 (GLOVE) ×2
GOWN PREVENTION PLUS XLARGE (GOWN DISPOSABLE) ×4 IMPLANT
GOWN STRL NON-REIN LRG LVL3 (GOWN DISPOSABLE) ×4 IMPLANT
GRAFT GORETEX STRT 6X50 (Vascular Products) ×2 IMPLANT
KIT BASIN OR (CUSTOM PROCEDURE TRAY) ×2 IMPLANT
KIT ROOM TURNOVER OR (KITS) ×2 IMPLANT
LOOP VESSEL MINI RED (MISCELLANEOUS) IMPLANT
NS IRRIG 1000ML POUR BTL (IV SOLUTION) ×2 IMPLANT
PACK CV ACCESS (CUSTOM PROCEDURE TRAY) ×2 IMPLANT
PAD ARMBOARD 7.5X6 YLW CONV (MISCELLANEOUS) ×4 IMPLANT
SPONGE GAUZE 2X2 STER 10/PKG (GAUZE/BANDAGES/DRESSINGS) ×1
SPONGE GAUZE 4X4 12PLY (GAUZE/BANDAGES/DRESSINGS) ×2 IMPLANT
SPONGE SURGIFOAM ABS GEL 100 (HEMOSTASIS) IMPLANT
STRIP CLOSURE SKIN 1/2X4 (GAUZE/BANDAGES/DRESSINGS) ×2 IMPLANT
SUT PROLENE 6 0 CC (SUTURE) ×4 IMPLANT
SUT VIC AB 3-0 SH 27 (SUTURE) ×2
SUT VIC AB 3-0 SH 27X BRD (SUTURE) ×2 IMPLANT
SUT VICRYL 4-0 PS2 18IN ABS (SUTURE) IMPLANT
TOWEL OR 17X24 6PK STRL BLUE (TOWEL DISPOSABLE) ×2 IMPLANT
TOWEL OR 17X26 10 PK STRL BLUE (TOWEL DISPOSABLE) ×2 IMPLANT
UNDERPAD 30X30 INCONTINENT (UNDERPADS AND DIAPERS) ×2 IMPLANT
WATER STERILE IRR 1000ML POUR (IV SOLUTION) ×2 IMPLANT

## 2012-06-03 NOTE — Progress Notes (Signed)
Regina PA at bedside, no new order given, level is same as pre-op.

## 2012-06-03 NOTE — H&P (View-Only) (Signed)
[facility] [place] HISTORY AND PHYSICAL   History of Present Illness:  Patient is a 36 y.o. year old female who presents for placement of a permanent hemodialysis access. The patient is right handed .  The patient is not currently on hemodialysis.  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 She previously had a left radiocephalic AV fistula which did not mature. She subsequently had a left forearm AV graft which lasted for years.  Other chronic medical problems include diabetes, hypertension, retinopathy.  These are all currently controlled and followed by Dr. Hyman Hopes  Past Medical History  Diagnosis Date  . ESRD (end stage renal disease)     history of peritoneal and Hemodialysis, now s/p renal  transplant  . Renal transplant disorder 2010  . Ovarian tumor, malignant 2005    resected in [place]  . Diabetic retinopathy   . HTN (hypertension)     not well controlled  . 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 HLD (hyperlipidemia)   . CAP (community acquired pneumonia) 2010  . Diabetes mellitus     Past Surgical History  Procedure Date  . Tumor removal 2005  . Av fistula placement   . Eye surgery     right eye   . Kidney transplant      Social History History  Substance Use Topics  . Smoking status: Never Smoker   . Smokeless tobacco: Never Used  . Alcohol Use: No    Family History Family History  Problem Relation Age of Onset  . Diabetes Mother   . Liver cancer Father   . Cancer Father     Allergies  Allergies  Allergen Reactions  . Ace Inhibitors Other (See Comments)     cause cough     Current Outpatient Prescriptions  Medication Sig Dispense Refill  . amLODipine (NORVASC) 10 MG tablet Take 10 mg by mouth daily.      [name] aspirin EC 81 MG tablet Take 81 mg by mouth daily.      [name] escitalopram (LEXAPRO) 10 MG tablet Take 10 mg  by mouth daily.      . furosemide (LASIX) 40 MG tablet Take 40 mg by mouth daily.      . insulin glargine (LANTUS) 100 UNIT/ML injection Inject 60 Units into the skin at bedtime.      [name] labetalol (NORMODYNE) 100 MG tablet Take 100 mg by mouth 3 (three) times daily.      . metoCLOPramide (REGLAN) 5 MG tablet Take 5 mg by mouth 4 (four) times daily.      . mycophenolate (CELLCEPT) 250 MG capsule Take 750 mg by mouth 2 (two) times daily.       [name] omeprazole (PRILOSEC) 20 MG capsule Take 20 mg by mouth daily.      . predniSONE (DELTASONE) 5 MG tablet Take 20 mg by mouth daily.      . simvastatin (ZOCOR) 20 MG tablet Take 20 mg by mouth every Monday, Wednesday, and Friday.      . sodium bicarbonate 650 MG tablet Take 650 mg by mouth 2 (two) times daily.      [name] sulfamethoxazole-trimethoprim (BACTRIM DS) 800-160 MG per tablet Take 1 tablet by mouth every Monday, Wednesday, and Friday.      . tacrolimus (PROGRAF) 1 MG capsule Take 2-3 mg by mouth 2 (two) times daily. 3 capsules  in the morning 2 capsules at night        ROS:   General:  No weight loss, Fever, chills  HEENT: No recent headaches, no nasal bleeding, no visual changes, no sore throat  Neurologic: No dizziness, blackouts, seizures. No recent symptoms of stroke or mini- stroke. No recent episodes of slurred speech, or temporary blindness.  Cardiac: No recent episodes of chest pain/pressure, no shortness of breath at rest.  No shortness of breath with exertion.  Denies history of atrial fibrillation or irregular heartbeat  Vascular: No history of rest pain in feet.  No history of claudication.  No history of non-healing ulcer, No history of DVT   Pulmonary: No home oxygen, no productive cough, no hemoptysis,  No asthma or wheezing  Musculoskeletal:  [ ]  Arthritis, [ ]  Low back pain,  [ ]  Joint pain  Hematologic:No history of hypercoagulable state.  No history of easy bleeding.  No history of anemia  Gastrointestinal: No hematochezia  or melena,  No gastroesophageal reflux, no trouble swallowing  Urinary: [x ] chronic Kidney disease, [ ]  on HD - [ ]  MWF or [ ]  TTHS, [ ]  Burning with urination, [ ]  Frequent urination, [ ]  Difficulty urinating;   Skin: No rashes  Psychological: No history of anxiety,  No history of depression   Physical Examination  Filed Vitals:   05/28/12 1136  BP: 159/78  Pulse: 85  Resp: 16  Height: 5\' 7"  (1.702 m)  Weight: 176 lb (79.833 kg)  SpO2: 100%    Body mass index is 27.57 kg/(m^2).  General:  Alert and oriented, no acute distress HEENT: Normal Neck: No bruit or JVD Pulmonary: Clear to auscultation bilaterally Cardiac: Regular Rate and Rhythm without murmur Gastrointestinal: Soft, non-tender, non-distended, no mass, no scars Skin: No rash Extremity Pulses:  2+ radial, brachial pulses bilaterally, thrombosed left forearm graft as well as left radiocephalic fistula Musculoskeletal: No deformity or edema  Neurologic: Upper and lower extremity motor 5/5 and symmetric   ASSESSMENT:   Needs hemodialysis access.   PLAN:  Left upper arm AV graft on 06/03/2012. Risks benefits possible complications and procedure details were explained the patient today. I also discussed with the patient the possibility of placing a right arm AV fistula. However this point she wishes to keep her access in the left arm.    Fabienne Bruns, MD [facility] [place] Office: [phone] Pager: [phone]

## 2012-06-03 NOTE — Progress Notes (Signed)
Critical value for hgb 6.0. [facility] PA for Dr. Arbie Cookey paged.

## 2012-06-03 NOTE — Anesthesia Preprocedure Evaluation (Signed)
Anesthesia Evaluation  Patient identified by MRN, date of birth, ID band Patient awake  General Assessment Comment:S/p renal tx  Reviewed: Allergy & Precautions, H&P , NPO status , Patient's Chart, lab work & pertinent test results  Airway Mallampati: II TM Distance: <3 FB Neck ROM: Full    Dental No notable dental hx. (+) Dental Advisory Given   Pulmonary neg pulmonary ROS,  breath sounds clear to auscultation  Pulmonary exam normal       Cardiovascular hypertension, Pt. on medications Rhythm:Regular Rate:Normal     Neuro/Psych negative neurological ROS  negative psych ROS   GI/Hepatic negative GI ROS, Neg liver ROS,   Endo/Other    Renal/GU DialysisRenal disease  negative genitourinary   Musculoskeletal negative musculoskeletal ROS (+)   Abdominal   Peds negative pediatric ROS (+)  Hematology  (+) Blood dyscrasia, anemia ,   Anesthesia Other Findings   Reproductive/Obstetrics negative OB ROS                           Anesthesia Physical Anesthesia Plan  ASA: III  Anesthesia Plan: MAC   Post-op Pain Management:    Induction: Intravenous  Airway Management Planned: Simple Face Mask  Additional Equipment:   Intra-op Plan:   Post-operative Plan:   Informed Consent: I have reviewed the patients History and Physical, chart, labs and discussed the procedure including the risks, benefits and alternatives for the proposed anesthesia with the patient or authorized representative who has indicated his/her understanding and acceptance.     Plan Discussed with: CRNA and Surgeon  Anesthesia Plan Comments:         Anesthesia Quick Evaluation

## 2012-06-03 NOTE — Progress Notes (Signed)
CBC drawn by lab. 

## 2012-06-03 NOTE — Progress Notes (Signed)
Bruit and thrill present 

## 2012-06-03 NOTE — Anesthesia Postprocedure Evaluation (Signed)
  Anesthesia Post-op Note  Patient: [name]  Procedure(s) Performed: Procedure(s) (LRB): INSERTION OF ARTERIOVENOUS (AV) GORE-TEX GRAFT ARM (Left)  Patient Location: PACU  Anesthesia Type: MAC  Level of Consciousness: awake and alert   Airway and Oxygen Therapy: Patient Spontanous Breathing  Post-op Pain: mild  Post-op Assessment: Post-op Vital signs reviewed, Patient's Cardiovascular Status Stable, Respiratory Function Stable, Patent Airway and No signs of Nausea or vomiting  Post-op Vital Signs: stable  Complications: No apparent anesthesia complications

## 2012-06-03 NOTE — Transfer of Care (Signed)
Immediate Anesthesia Transfer of Care Note  Patient: [name]  Procedure(s) Performed: Procedure(s) (LRB): INSERTION OF ARTERIOVENOUS (AV) GORE-TEX GRAFT ARM (Left)  Patient Location: PACU  Anesthesia Type: MAC  Level of Consciousness: awake, alert , oriented and patient cooperative  Airway & Oxygen Therapy: Patient Spontanous Breathing  Post-op Assessment: Report given to PACU RN, Post -op Vital signs reviewed and stable and Patient moving all extremities  Post vital signs: Reviewed and stable  Complications: No apparent anesthesia complications

## 2012-06-03 NOTE — Interval H&P Note (Signed)
History and Physical Interval Note:  06/03/2012 10:48 AM  [name]  has presented today for surgery, with the diagnosis of End Stage Renal Disease  The various methods of treatment have been discussed with the patient and family. After consideration of risks, benefits and other options for treatment, the patient has consented to  Procedure(s) (LRB): INSERTION OF ARTERIOVENOUS (AV) GORE-TEX GRAFT ARM (Left) as a surgical intervention .  The patient's history has been reviewed, patient examined, no change in status, stable for surgery.  I have reviewed the patient's chart and labs.  Questions were answered to the patient's satisfaction.     Aengus Sauceda

## 2012-06-03 NOTE — Progress Notes (Signed)
Pt. With positive bruit and thrill to left arm

## 2012-06-03 NOTE — Telephone Encounter (Signed)
notified patient of fu appt. with dr. early on sept. 10th at 11:30

## 2012-06-03 NOTE — Progress Notes (Signed)
Report received from Maximiano Coss. RN.

## 2012-06-04 ENCOUNTER — Inpatient Hospital Stay (HOSPITAL_COMMUNITY): Admission: RE | Admit: 2012-06-04 | Payer: BC Managed Care – PPO | Source: Ambulatory Visit

## 2012-06-04 ENCOUNTER — Encounter (HOSPITAL_COMMUNITY): Payer: Self-pay | Admitting: Vascular Surgery

## 2012-06-09 ENCOUNTER — Inpatient Hospital Stay (HOSPITAL_COMMUNITY)
Admission: EM | Admit: 2012-06-09 | Discharge: 2012-06-12 | DRG: 682 | Disposition: A | Discharge status: Home / Self Care | Payer: Medicare Other | Attending: Internal Medicine | Admitting: Internal Medicine

## 2012-06-09 ENCOUNTER — Encounter (HOSPITAL_COMMUNITY): Payer: BC Managed Care – PPO

## 2012-06-09 ENCOUNTER — Encounter (HOSPITAL_COMMUNITY): Payer: Self-pay

## 2012-06-09 ENCOUNTER — Emergency Department (HOSPITAL_COMMUNITY): Payer: Medicare Other

## 2012-06-09 ENCOUNTER — Other Ambulatory Visit: Payer: Self-pay | Admitting: Oncology

## 2012-06-09 DIAGNOSIS — E11319 Type 2 diabetes mellitus with unspecified diabetic retinopathy without macular edema: Secondary | ICD-10-CM | POA: Diagnosis present

## 2012-06-09 DIAGNOSIS — N179 Acute kidney failure, unspecified: Principal | ICD-10-CM | POA: Diagnosis present

## 2012-06-09 DIAGNOSIS — A192 Acute miliary tuberculosis, unspecified: Secondary | ICD-10-CM

## 2012-06-09 DIAGNOSIS — R0602 Shortness of breath: Secondary | ICD-10-CM

## 2012-06-09 DIAGNOSIS — D649 Anemia, unspecified: Secondary | ICD-10-CM | POA: Diagnosis present

## 2012-06-09 DIAGNOSIS — J810 Acute pulmonary edema: Secondary | ICD-10-CM | POA: Diagnosis present

## 2012-06-09 DIAGNOSIS — I120 Hypertensive chronic kidney disease with stage 5 chronic kidney disease or end stage renal disease: Secondary | ICD-10-CM | POA: Diagnosis present

## 2012-06-09 DIAGNOSIS — Z9119 Patient's noncompliance with other medical treatment and regimen: Secondary | ICD-10-CM

## 2012-06-09 DIAGNOSIS — Z940 Kidney transplant status: Secondary | ICD-10-CM

## 2012-06-09 DIAGNOSIS — N185 Chronic kidney disease, stage 5: Secondary | ICD-10-CM | POA: Diagnosis present

## 2012-06-09 DIAGNOSIS — N189 Chronic kidney disease, unspecified: Secondary | ICD-10-CM

## 2012-06-09 DIAGNOSIS — N186 End stage renal disease: Secondary | ICD-10-CM | POA: Insufficient documentation

## 2012-06-09 DIAGNOSIS — I10 Essential (primary) hypertension: Secondary | ICD-10-CM | POA: Diagnosis present

## 2012-06-09 DIAGNOSIS — Z91199 Patient's noncompliance with other medical treatment and regimen due to unspecified reason: Secondary | ICD-10-CM

## 2012-06-09 DIAGNOSIS — E119 Type 2 diabetes mellitus without complications: Secondary | ICD-10-CM | POA: Diagnosis present

## 2012-06-09 DIAGNOSIS — A150 Tuberculosis of lung: Secondary | ICD-10-CM | POA: Insufficient documentation

## 2012-06-09 DIAGNOSIS — N19 Unspecified kidney failure: Secondary | ICD-10-CM

## 2012-06-09 DIAGNOSIS — E785 Hyperlipidemia, unspecified: Secondary | ICD-10-CM | POA: Insufficient documentation

## 2012-06-09 DIAGNOSIS — R7989 Other specified abnormal findings of blood chemistry: Secondary | ICD-10-CM

## 2012-06-09 DIAGNOSIS — Z8543 Personal history of malignant neoplasm of ovary: Secondary | ICD-10-CM

## 2012-06-09 DIAGNOSIS — C569 Malignant neoplasm of unspecified ovary: Secondary | ICD-10-CM | POA: Insufficient documentation

## 2012-06-09 DIAGNOSIS — J811 Chronic pulmonary edema: Secondary | ICD-10-CM | POA: Diagnosis present

## 2012-06-09 DIAGNOSIS — R5383 Other fatigue: Secondary | ICD-10-CM

## 2012-06-09 DIAGNOSIS — R079 Chest pain, unspecified: Secondary | ICD-10-CM | POA: Diagnosis present

## 2012-06-09 DIAGNOSIS — Y830 Surgical operation with transplant of whole organ as the cause of abnormal reaction of the patient, or of later complication, without mention of misadventure at the time of the procedure: Secondary | ICD-10-CM | POA: Diagnosis present

## 2012-06-09 DIAGNOSIS — E1139 Type 2 diabetes mellitus with other diabetic ophthalmic complication: Secondary | ICD-10-CM | POA: Diagnosis present

## 2012-06-09 DIAGNOSIS — T8610 Unspecified complication of kidney transplant: Secondary | ICD-10-CM | POA: Diagnosis present

## 2012-06-09 LAB — CBC WITH DIFFERENTIAL/PLATELET
Basophils Absolute: 0 10*3/uL (ref 0.0–0.1)
Basophils Absolute: 0 10*3/uL (ref 0.0–0.1)
Basophils Relative: 0 % (ref 0–1)
Basophils Relative: 0 % (ref 0–1)
Eosinophils Absolute: 0.1 10*3/uL (ref 0.0–0.7)
Eosinophils Relative: 1 % (ref 0–5)
Eosinophils Relative: 1 % (ref 0–5)
HCT: 14 % — ABNORMAL LOW (ref 36.0–46.0)
HCT: 20.7 % — ABNORMAL LOW (ref 36.0–46.0)
Hemoglobin: 4.6 g/dL — CL (ref 12.0–15.0)
Hemoglobin: 7.2 g/dL — ABNORMAL LOW (ref 12.0–15.0)
Lymphocytes Relative: 11 % — ABNORMAL LOW (ref 12–46)
Lymphocytes Relative: 14 % (ref 12–46)
Lymphs Abs: 1 10*3/uL (ref 0.7–4.0)
Lymphs Abs: 1.1 10*3/uL (ref 0.7–4.0)
MCH: 28 pg (ref 26.0–34.0)
MCHC: 32.9 g/dL (ref 30.0–36.0)
MCV: 85.4 fL (ref 78.0–100.0)
MCV: 85.9 fL (ref 78.0–100.0)
Monocytes Absolute: 0.6 10*3/uL (ref 0.1–1.0)
Monocytes Relative: 7 % (ref 3–12)
Monocytes Relative: 9 % (ref 3–12)
Neutro Abs: 7.4 10*3/uL (ref 1.7–7.7)
Neutro Abs: 6.2 10*3/uL (ref 1.7–7.7)
Neutrophils Relative %: 81 % — ABNORMAL HIGH (ref 43–77)
Platelets: 247 10*3/uL (ref 150–400)
RBC: 1.64 MIL/uL — ABNORMAL LOW (ref 3.87–5.11)
RBC: 2.41 MIL/uL — ABNORMAL LOW (ref 3.87–5.11)
RDW: 15.4 % (ref 11.5–15.5)
WBC: 9.1 10*3/uL (ref 4.0–10.5)
WBC: 8.1 10*3/uL (ref 4.0–10.5)

## 2012-06-09 LAB — COMPREHENSIVE METABOLIC PANEL
ALT: 10 U/L (ref 0–35)
AST: 15 U/L (ref 0–37)
Albumin: 3 g/dL — ABNORMAL LOW (ref 3.5–5.2)
Alkaline Phosphatase: 72 U/L (ref 39–117)
BUN: 49 mg/dL — ABNORMAL HIGH (ref 6–23)
CO2: 16 mEq/L — ABNORMAL LOW (ref 19–32)
Calcium: 8.4 mg/dL (ref 8.4–10.5)
Chloride: 100 mEq/L (ref 96–112)
Creatinine, Ser: 5.17 mg/dL — ABNORMAL HIGH (ref 0.50–1.10)
GFR calc Af Amer: 11 mL/min — ABNORMAL LOW (ref >90)
GFR calc non Af Amer: 10 mL/min — ABNORMAL LOW (ref >90)
Glucose, Bld: 167 mg/dL — ABNORMAL HIGH (ref 70–99)
Potassium: 3.6 mEq/L (ref 3.5–5.1)
Sodium: 132 mEq/L — ABNORMAL LOW (ref 135–145)
Total Bilirubin: 0.2 mg/dL — ABNORMAL LOW (ref 0.3–1.2)
Total Protein: 5.9 g/dL — ABNORMAL LOW (ref 6.0–8.3)

## 2012-06-09 LAB — D-DIMER, QUANTITATIVE (NOT AT ARMC): D-Dimer, Quant: 0.66 ug/mL-FEU — ABNORMAL HIGH (ref 0.00–0.48)

## 2012-06-09 LAB — CREATININE, SERUM
Creatinine, Ser: 5.22 mg/dL — ABNORMAL HIGH (ref 0.50–1.10)
GFR calc Af Amer: 11 mL/min — ABNORMAL LOW (ref >90)
GFR calc non Af Amer: 10 mL/min — ABNORMAL LOW (ref >90)

## 2012-06-09 LAB — PREPARE RBC (CROSSMATCH)

## 2012-06-09 LAB — TROPONIN I: Troponin I: 1.09 ng/mL (ref <0.30)

## 2012-06-09 LAB — GLUCOSE, CAPILLARY: Glucose-Capillary: 161 mg/dL — ABNORMAL HIGH (ref 70–99)

## 2012-06-09 MED ORDER — INSULIN GLARGINE 100 UNIT/ML ~~LOC~~ SOLN
60.0000 [IU] | Freq: Every day | SUBCUTANEOUS | Status: DC
  Administered 2012-06-09: 60 [IU] via SUBCUTANEOUS

## 2012-06-09 MED ORDER — FUROSEMIDE 10 MG/ML IJ SOLN
80.0000 mg | Freq: Once | INTRAMUSCULAR | Status: AC
  Administered 2012-06-09: 80 mg via INTRAVENOUS
  Filled 2012-06-09: qty 8

## 2012-06-09 MED ORDER — ESCITALOPRAM OXALATE 10 MG PO TABS
10.0000 mg | ORAL_TABLET | Freq: Every day | ORAL | Status: DC
  Administered 2012-06-10 – 2012-06-12 (×3): 10 mg via ORAL
  Filled 2012-06-09 (×3): qty 1

## 2012-06-09 MED ORDER — SULFAMETHOXAZOLE-TMP DS 800-160 MG PO TABS
1.0000 | ORAL_TABLET | ORAL | Status: DC
  Administered 2012-06-10 – 2012-06-12 (×2): 1 via ORAL
  Filled 2012-06-09 (×2): qty 1

## 2012-06-09 MED ORDER — LABETALOL HCL 100 MG PO TABS
100.0000 mg | ORAL_TABLET | Freq: Three times a day (TID) | ORAL | Status: DC
  Administered 2012-06-09 – 2012-06-12 (×9): 100 mg via ORAL
  Filled 2012-06-09 (×11): qty 1

## 2012-06-09 MED ORDER — SODIUM CHLORIDE 0.9 % IJ SOLN: 3.0000 mL | INTRAMUSCULAR | Status: DC | PRN

## 2012-06-09 MED ORDER — TACROLIMUS 1 MG PO CAPS
2.0000 mg | ORAL_CAPSULE | Freq: Every day | ORAL | Status: DC
  Administered 2012-06-09 – 2012-06-11 (×3): 2 mg via ORAL
  Filled 2012-06-09 (×4): qty 2

## 2012-06-09 MED ORDER — ACETAMINOPHEN 325 MG PO TABS: 650.0000 mg | ORAL_TABLET | Freq: Four times a day (QID) | ORAL | Status: DC | PRN

## 2012-06-09 MED ORDER — DARBEPOETIN ALFA-POLYSORBATE 150 MCG/0.3ML IJ SOLN
150.0000 ug | Freq: Once | INTRAMUSCULAR | Status: AC
  Administered 2012-06-09: 150 ug via SUBCUTANEOUS
  Filled 2012-06-09: qty 0.3

## 2012-06-09 MED ORDER — ONDANSETRON HCL 4 MG PO TABS: 4.0000 mg | ORAL_TABLET | Freq: Four times a day (QID) | ORAL | Status: DC | PRN

## 2012-06-09 MED ORDER — DARBEPOETIN ALFA-POLYSORBATE 150 MCG/0.3ML IJ SOLN: 150.0000 ug | INTRAMUSCULAR | Status: DC

## 2012-06-09 MED ORDER — SODIUM CHLORIDE 0.9 % IJ SOLN
3.0000 mL | Freq: Two times a day (BID) | INTRAMUSCULAR | Status: DC
  Administered 2012-06-09 – 2012-06-10 (×2): 3 mL via INTRAVENOUS

## 2012-06-09 MED ORDER — TACROLIMUS 1 MG PO CAPS
3.0000 mg | ORAL_CAPSULE | Freq: Every day | ORAL | Status: DC
  Administered 2012-06-10 – 2012-06-12 (×3): 3 mg via ORAL
  Filled 2012-06-09 (×3): qty 3

## 2012-06-09 MED ORDER — MYCOPHENOLATE MOFETIL 250 MG PO CAPS
750.0000 mg | ORAL_CAPSULE | Freq: Two times a day (BID) | ORAL | Status: DC
  Administered 2012-06-09 – 2012-06-12 (×6): 750 mg via ORAL
  Filled 2012-06-09 (×7): qty 3

## 2012-06-09 MED ORDER — ENOXAPARIN SODIUM 40 MG/0.4ML ~~LOC~~ SOLN
40.0000 mg | SUBCUTANEOUS | Status: DC
  Administered 2012-06-09: 40 mg via SUBCUTANEOUS
  Filled 2012-06-09 (×2): qty 0.4

## 2012-06-09 MED ORDER — TACROLIMUS 1 MG PO CAPS: 2.0000 mg | ORAL_CAPSULE | Freq: Two times a day (BID) | ORAL | Status: DC

## 2012-06-09 MED ORDER — SODIUM BICARBONATE 650 MG PO TABS
650.0000 mg | ORAL_TABLET | Freq: Two times a day (BID) | ORAL | Status: DC
  Administered 2012-06-09 – 2012-06-12 (×6): 650 mg via ORAL
  Filled 2012-06-09 (×7): qty 1

## 2012-06-09 MED ORDER — PREDNISONE 20 MG PO TABS
20.0000 mg | ORAL_TABLET | Freq: Every day | ORAL | Status: DC
  Administered 2012-06-10 – 2012-06-12 (×3): 20 mg via ORAL
  Filled 2012-06-09 (×4): qty 1

## 2012-06-09 MED ORDER — PANTOPRAZOLE SODIUM 40 MG PO TBEC
40.0000 mg | DELAYED_RELEASE_TABLET | Freq: Every day | ORAL | Status: DC
  Administered 2012-06-09 – 2012-06-12 (×4): 40 mg via ORAL
  Filled 2012-06-09 (×4): qty 1

## 2012-06-09 MED ORDER — SODIUM CHLORIDE 0.9 % IV SOLN: 250.0000 mL | INTRAVENOUS | Status: DC | PRN

## 2012-06-09 MED ORDER — METOCLOPRAMIDE HCL 5 MG PO TABS
5.0000 mg | ORAL_TABLET | Freq: Four times a day (QID) | ORAL | Status: DC
  Administered 2012-06-09 – 2012-06-12 (×12): 5 mg via ORAL
  Filled 2012-06-09 (×15): qty 1

## 2012-06-09 MED ORDER — ALBUTEROL SULFATE (5 MG/ML) 0.5% IN NEBU: 2.5000 mg | INHALATION_SOLUTION | RESPIRATORY_TRACT | Status: DC | PRN

## 2012-06-09 MED ORDER — AMLODIPINE BESYLATE 10 MG PO TABS
10.0000 mg | ORAL_TABLET | Freq: Every day | ORAL | Status: DC
  Administered 2012-06-10 – 2012-06-12 (×3): 10 mg via ORAL
  Filled 2012-06-09 (×3): qty 1

## 2012-06-09 MED ORDER — SODIUM CHLORIDE 0.9 % IJ SOLN: 3.0000 mL | Freq: Two times a day (BID) | INTRAMUSCULAR | Status: DC

## 2012-06-09 MED ORDER — INSULIN ASPART 100 UNIT/ML ~~LOC~~ SOLN
0.0000 [IU] | Freq: Three times a day (TID) | SUBCUTANEOUS | Status: DC
  Administered 2012-06-09: 2 [IU] via SUBCUTANEOUS
  Administered 2012-06-10: 1 [IU] via SUBCUTANEOUS

## 2012-06-09 MED ORDER — FUROSEMIDE 10 MG/ML IJ SOLN
80.0000 mg | Freq: Three times a day (TID) | INTRAMUSCULAR | Status: DC
  Administered 2012-06-09 – 2012-06-11 (×6): 80 mg via INTRAVENOUS
  Filled 2012-06-09 (×8): qty 8

## 2012-06-09 MED ORDER — ACETAMINOPHEN 650 MG RE SUPP: 650.0000 mg | Freq: Four times a day (QID) | RECTAL | Status: DC | PRN

## 2012-06-09 MED ORDER — ONDANSETRON HCL 4 MG/2ML IJ SOLN: 4.0000 mg | Freq: Four times a day (QID) | INTRAMUSCULAR | Status: DC | PRN

## 2012-06-09 MED ORDER — SIMVASTATIN 20 MG PO TABS
20.0000 mg | ORAL_TABLET | ORAL | Status: DC
  Administered 2012-06-10: 20 mg via ORAL
  Filled 2012-06-09 (×2): qty 1

## 2012-06-09 MED ORDER — ASPIRIN EC 81 MG PO TBEC
81.0000 mg | DELAYED_RELEASE_TABLET | Freq: Every day | ORAL | Status: DC
  Administered 2012-06-09 – 2012-06-12 (×4): 81 mg via ORAL
  Filled 2012-06-09 (×4): qty 1

## 2012-06-09 NOTE — ED Notes (Signed)
After Lasix admin, pt c/o pain to IV site.  Small amount of edema noted, approximately the size of a quarter directly around the IV site.  IV removed.  No redness noted.

## 2012-06-09 NOTE — H&P (Signed)
PATIENT DETAILS Name: [name] Age: 36 y.o. Sex: female Date of Birth: December 22, 1975 Admit Date: 06/09/2012 ZOX:[id number], MD   CHIEF COMPLAINT:  Shortness of breath and fatigue for 2 days.  HPI: Patient is a 36 year old [name] who has a past medical history of renal transplant and is on immune suppressive therapy, per review of prior medical records she has been somewhat noncompliant to her meds and to followup, worsening anemia inspite of Epogen therapy and requiring numerous blood transfusions recently presents to the hospital with the above-noted complaints. The patient her last blood transfusion was around 2 weeks ago, she has had worsening of her chronic kidney failure recently and has had a AV fistula placed recently, claims that for the past 2-3 days she has been feeling very fatigued and tired. She claims that this happens very easily and with minimal physical activity. She claims for the past 2-3 days she has been having shortness of breath, she claims that this is mostly exertional. She denies any chest pain. She denies any palpitations. She denies any headache.  In the emergency room, patient underwent a chest x-ray which showed mild interstitial edema, her CBC showed a hemoglobin of 4.6, and I was subsequently asked to admit this patient for further evaluation and treatment. During my evaluation, patient was comfortable, her O2 saturations were in the mid 90s on 3 L of oxygen via nasal cannula.   ALLERGIES:   Allergies  Allergen Reactions  . Ace Inhibitors Other (See Comments)     cause cough    PAST MEDICAL HISTORY: Past Medical History  Diagnosis Date  . ESRD (end stage renal disease)     history of peritoneal and Hemodialysis, now s/p renal  transplant  . Renal transplant disorder 2010  . Ovarian tumor, malignant 2005    resected in [place]  . Diabetic retinopathy   . HTN (hypertension)     not well controlled  . TB (pulmonary tuberculosis) 2003, 2007   history of miliary TB partially treated in 2003, and then completely treated in 2007  . HLD (hyperlipidemia)   . CAP (community acquired pneumonia) 2010  . Diabetes mellitus     PAST SURGICAL HISTORY: Past Surgical History  Procedure Date  . Tumor removal 2005    Ovarian, resected in [place]  . Av fistula placement   . Eye surgery     right eye   . Kidney transplant 2010    at [facility]  . Av fistula placement 06/03/2012    Procedure: INSERTION OF ARTERIOVENOUS (AV) GORE-TEX GRAFT ARM;  Surgeon: Larina Earthly, MD;  Location: [facility] OR;  Service: Vascular;  Laterality: Left;    MEDICATIONS AT HOME: Prior to Admission medications   Medication Sig Start Date End Date Taking? Authorizing Provider  amLODipine (NORVASC) 10 MG tablet Take 10 mg by mouth daily.   Yes Historical Provider, MD  aspirin EC 81 MG tablet Take 81 mg by mouth daily.   Yes Historical Provider, MD  escitalopram (LEXAPRO) 10 MG tablet Take 10 mg by mouth daily.   Yes Historical Provider, MD  furosemide (LASIX) 40 MG tablet Take 40 mg by mouth daily.   Yes Historical Provider, MD  insulin glargine (LANTUS) 100 UNIT/ML injection Inject 60 Units into the skin at bedtime.   Yes Historical Provider, MD  labetalol (NORMODYNE) 100 MG tablet Take 100 mg by mouth 3 (three) times daily.   Yes Historical Provider, MD  metoCLOPramide (REGLAN) 5 MG tablet Take 5 mg by  mouth 4 (four) times daily.   Yes Historical Provider, MD  mycophenolate (CELLCEPT) 250 MG capsule Take 750 mg by mouth 2 (two) times daily.    Yes Historical Provider, MD  omeprazole (PRILOSEC) 20 MG capsule Take 20 mg by mouth daily. 05/07/12  Yes Historical Provider, MD  predniSONE (DELTASONE) 5 MG tablet Take 20 mg by mouth daily.   Yes Historical Provider, MD  simvastatin (ZOCOR) 20 MG tablet Take 20 mg by mouth every Monday, Wednesday, and Friday.   Yes Historical Provider, MD  sodium bicarbonate 650 MG tablet Take 650 mg by mouth 2 (two) times daily.   Yes Historical  Provider, MD  sulfamethoxazole-trimethoprim (BACTRIM DS) 800-160 MG per tablet Take 1 tablet by mouth every Monday, Wednesday, and Friday.   Yes Historical Provider, MD  tacrolimus (PROGRAF) 1 MG capsule Take 2-3 mg by mouth 2 (two) times daily. 3 capsules in the morning 2 capsules at night   Yes Historical Provider, MD    FAMILY HISTORY: Family History  Problem Relation Age of Onset  . Diabetes Mother   . Liver cancer Father   . Cancer Father     SOCIAL HISTORY:  reports that she has never smoked. She has never used smokeless tobacco. She reports that she does not drink alcohol or use illicit drugs.  REVIEW OF SYSTEMS:  Constitutional:   No  weight loss, night sweats,  Fevers, chills, fatigue.  HEENT:    No headaches, Difficulty swallowing,Tooth/dental problems,Sore throat,  No sneezing, itching, ear ache, nasal congestion, post nasal drip,   Cardio-vascular: No chest pain,  PND, +swelling in lower extremities, anasarca,dizziness, palpitations  GI:  No heartburn, indigestion, abdominal pain, nausea, vomiting, diarrhea, change in  bowel habits, loss of appetite  Resp: No excess mucus, no productive cough, No non-productive cough,  No coughing up of blood.No change in color of mucus..No chest wall deformity  Skin:  no rash or lesions.  GU:  no dysuria, change in color of urine, no urgency or frequency.  No flank pain.  Musculoskeletal: No joint pain or swelling.  No decreased range of motion.  No back pain.  Psych: No change in mood or affect. No depression or anxiety.  No memory loss.   PHYSICAL EXAM: Blood pressure 121/63, pulse 93, temperature 97.8 F (36.6 C), temperature source Oral, resp. rate 20, height 5\' 7"  (1.702 m), weight 83.4 kg (183 lb 13.8 oz), last menstrual period 04/06/2012, SpO2 95.00%.  General appearance :Awake, alert, not in any distress. Speech Clear. Not toxic Looking HEENT: Atraumatic and Normocephalic, pupils equally reactive to light and  accomodation Neck: supple, no JVD. No cervical lymphadenopathy.  Chest:Good air entry bilaterally, bibasilar rales  CVS: S1 S2 regular, no murmurs.  Abdomen: Bowel sounds present, Non tender and not distended with no gaurding, rigidity or rebound. Extremities: B/L Lower Ext shows 1+ edema, both legs are warm to touch. Neurology: Awake alert, and oriented X 3, CN II-XII intact, Non focal Skin:No Rash Wounds:N/A  LABS ON ADMISSION:   Basename 06/09/12 0922  NA 132*  K 3.6  CL 100  CO2 16*  GLUCOSE 167*  BUN 49*  CREATININE 5.17*  CALCIUM 8.4  MG --  PHOS --    Basename 06/09/12 0922  AST 15  ALT 10  ALKPHOS 72  BILITOT 0.2*  PROT 5.9*  ALBUMIN 3.0*   No results found for this basename: LIPASE:2,AMYLASE:2 in the last 72 hours  Basename 06/09/12 0922  WBC 9.1  NEUTROABS 7.4  HGB  4.6*  HCT 14.0*  MCV 85.4  PLT 247    Basename 06/09/12 0923  CKTOTAL --  CKMB --  CKMBINDEX --  TROPONINI 1.09*    Basename 06/09/12 0922  DDIMER 0.66*   No components found with this basename: POCBNP:3   RADIOLOGIC STUDIES ON ADMISSION: Dg Chest Port 1 View  06/09/2012  *RADIOLOGY REPORT*  Clinical Data: Chest pain and shortness of breath.  PORTABLE CHEST - 1 VIEW  Comparison: PA and lateral chest 06/02/2012.  Findings: There is cardiomegaly and mild interstitial edema.  No pneumothorax, consolidative process or pleural effusion.  No focal bony abnormality.  IMPRESSION: Cardiomegaly and mild interstitial edema.   Original Report Authenticated By: Bernadene Bell. D'ALESSIO, M.D.     ASSESSMENT AND PLAN: Present on Admission:  .Shortness of breath -This is multifactorial-but probably secondary to Pulmonary Edema and Anemia - will continue with PRBC transfusion and start Lasix 80 mg IV TID and assess for clinical response  .Anemia -likely 2/2 to worsening CKD-no overt evidence of blood loss. -However per Dr Audree Camel has had no appropriate response to Epogen and IV Iron infusion  which he has already commenced in the past -I will ask Hematology to consult and will send a peripheral smear -In interim will transfuse PRBC 2 units, and check FOBT  .Pulmonary edema -in a setting of worsening Kidney function -last Echo in 2010-showed preserved EF -will attempt to diurese with Lasix, Dr Hyman Hopes agrees with current dosing of Lasix  .Elevated troponin level -?demand ischemia or false positive elevation of underlying CKD-patient does not have any chest pain, EKG not concerning -given significant anemia-will not place on Heparin gtt, will continue with ASA -check 2 D Echo, and await Cards eval  .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 and on numerous immunosuppressives -will continue immuno-suppresives -spoke with Dr Dorathy Kinsman with current dosing of Lasix, he will formally evaluate as well -recently has had a AV fistula placed-in anticipation of impending Dialysis in the near future -currently no acute indication of Dialysis  .HYPERTENSION -continue with Labetolol and Amlodipine -adjust meds as necessary depending on BP readings  .HYPERLIPIDEMIA -c/w Statins  .DM (diabetes mellitus) -continue with Lantus, place on SSI and adjust according depending in CBG readings  .Fatigue -2/2 to anemia and CHF -assess after treatment of above  Further plan will depend as patient's clinical course evolves and further radiologic and laboratory data become available. Patient will be monitored closely.  DVT Prophylaxis: -lovenox-for now-as no overt evidence of bleeding  Code Status: Full Code  Total time spent for admission equals 45 minutes.  [facility] Pager [phone]  If 7PM-7AM, please contact night-coverage [email or web address] Password [email or web address] 06/09/2012, 2:05 PM

## 2012-06-09 NOTE — ED Provider Notes (Signed)
Medical screening examination/treatment/procedure(s) were performed by non-physician practitioner and as supervising physician I was immediately available for consultation/collaboration.   Rolan Bucco, MD 06/09/12 [phone]

## 2012-06-09 NOTE — ED Provider Notes (Signed)
History     CSN: [id number]  Arrival date & time 06/09/12  7829   First MD Initiated Contact with Patient 06/09/12 [phone]      Chief Complaint  Patient presents with  . Weakness  . Shortness of Breath    (Consider location/radiation/quality/duration/timing/severity/associated sxs/prior treatment) HPI The patient presents to the ER with increased weakness and shortness of breath over the last few days. The patient states that she is worried that this could be her anemia. The patient recently had a fistula placed a week ago. The patient states that she has not had any fever, nausea, vomiting, visual changes, headache, diarrhea, abdominal pain, back pain, or rectal bleeding. The patient states that she had some mild chest discomfort. The patient states that she is due to start dialysis soon.  Past Medical History  Diagnosis Date  . ESRD (end stage renal disease)     history of peritoneal and Hemodialysis, now s/p renal  transplant  . Renal transplant disorder 2010  . Ovarian tumor, malignant 2005    resected in [place]  . Diabetic retinopathy   . HTN (hypertension)     not well controlled  . 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 HLD (hyperlipidemia)   . CAP (community acquired pneumonia) 2010  . Diabetes mellitus     Past Surgical History  Procedure Date  . Tumor removal 2005  . Av fistula placement   . Eye surgery     right eye   . Kidney transplant   . Av fistula placement 06/03/2012    Procedure: INSERTION OF ARTERIOVENOUS (AV) GORE-TEX GRAFT ARM;  Surgeon: Larina Earthly, MD;  Location: [facility] OR;  Service: Vascular;  Laterality: Left;    Family History  Problem Relation Age of Onset  . Diabetes Mother   . Liver cancer Father   . Cancer Father     History  Substance Use Topics  . Smoking status: Never Smoker   . Smokeless tobacco: Never Used  . Alcohol Use: No    OB History    Grav Para Term Preterm Abortions TAB SAB Ect Mult Living                  Review of Systems All other systems negative except as documented in the HPI. All pertinent positives and negatives as reviewed in the HPI.  Allergies  Ace inhibitors  Home Medications   Current Outpatient Rx  Name Route Sig Dispense Refill  . AMLODIPINE BESYLATE 10 MG PO TABS Oral Take 10 mg by mouth daily.    . ASPIRIN EC 81 MG PO TBEC Oral Take 81 mg by mouth daily.    [name] ESCITALOPRAM OXALATE 10 MG PO TABS Oral Take 10 mg by mouth daily.    . FUROSEMIDE 40 MG PO TABS Oral Take 40 mg by mouth daily.    . INSULIN GLARGINE 100 UNIT/ML [place] SOLN Subcutaneous Inject 60 Units into the skin at bedtime.    [name] LABETALOL HCL 100 MG PO TABS Oral Take 100 mg by mouth 3 (three) times daily.    [name] METOCLOPRAMIDE HCL 5 MG PO TABS Oral Take 5 mg by mouth 4 (four) times daily.    [name] MYCOPHENOLATE MOFETIL 250 MG PO CAPS Oral Take 750 mg by mouth 2 (two) times daily.     [name] OMEPRAZOLE 20 MG PO CPDR Oral Take 20 mg by mouth daily.    [name]  PREDNISONE 5 MG PO TABS Oral Take 20 mg by mouth daily.    [name] SIMVASTATIN 20 MG PO TABS Oral Take 20 mg by mouth every Monday, Wednesday, and Friday.    . SODIUM BICARBONATE 650 MG PO TABS Oral Take 650 mg by mouth 2 (two) times daily.    Lynnda Child DS 800-160 MG PO TABS Oral Take 1 tablet by mouth every Monday, Wednesday, and Friday.    [name] TACROLIMUS 1 MG PO CAPS Oral Take 2-3 mg by mouth 2 (two) times daily. 3 capsules in the morning 2 capsules at night      BP 121/63  Pulse 93  Temp 98.2 F (36.8 C) (Oral)  Resp 20  SpO2 95%  LMP 04/06/2012  Physical Exam  Constitutional: She is oriented to person, place, and time. She appears well-developed and well-nourished. She appears distressed.  HENT:  Head: Normocephalic.  Mouth/Throat: Oropharynx is clear and moist.  Eyes: Pupils are equal, round, and reactive to light.  Neck: Normal range of motion. Neck supple.  Cardiovascular:  Normal rate, regular rhythm and normal heart sounds.  Exam reveals no gallop and no friction rub.   No murmur heard. Pulmonary/Chest: Effort normal. She has rales.  Abdominal: Soft. Bowel sounds are normal. She exhibits no distension. There is no tenderness. There is no guarding.  Neurological: She is alert and oriented to person, place, and time.  Skin: Skin is warm and dry. No rash noted. No erythema.    ED Course  Procedures (including critical care time)  Labs Reviewed  CBC WITH DIFFERENTIAL - Abnormal; Notable for the following:    RBC 1.64 (*)     Hemoglobin 4.6 (*)     HCT 14.0 (*)     Neutrophils Relative 81 (*)     Lymphocytes Relative 11 (*)     All other components within normal limits  COMPREHENSIVE METABOLIC PANEL - Abnormal; Notable for the following:    Sodium 132 (*)     CO2 16 (*)     Glucose, Bld 167 (*)     BUN 49 (*)     Creatinine, Ser 5.17 (*)     Total Protein 5.9 (*)     Albumin 3.0 (*)     Total Bilirubin 0.2 (*)     GFR calc non Af Amer 10 (*)     GFR calc Af Amer 11 (*)     All other components within normal limits  TROPONIN I - Abnormal; Notable for the following:    Troponin I 1.09 (*)     All other components within normal limits  D-DIMER, QUANTITATIVE - Abnormal; Notable for the following:    D-Dimer, Quant 0.66 (*)     All other components within normal limits  PREPARE RBC (CROSSMATCH)  TYPE AND SCREEN   Dg Chest Port 1 View  06/09/2012  *RADIOLOGY REPORT*  Clinical Data: Chest pain and shortness of breath.  PORTABLE CHEST - 1 VIEW  Comparison: PA and lateral chest 06/02/2012.  Findings: There is cardiomegaly and mild interstitial edema.  No pneumothorax, consolidative process or pleural effusion.  No focal bony abnormality.  IMPRESSION: Cardiomegaly and mild interstitial edema.   Original Report Authenticated By: Bernadene Bell. Maricela Curet, M.D.     The patient will be admitted to the hospital for further evaluation and care. The patient is  receiving blood products. The patient is stable here in the ER thus far. I spoke with Dr. Hyman Hopes who is her nephrologist and he  would like her admitted and then I spoke with the Hospitalist.    MDM  MDM Reviewed: nursing note and vitals Interpretation: labs, ECG and x-ray Consults: admitting MD            Carlyle Dolly, PA-C 06/09/12 1214

## 2012-06-09 NOTE — Progress Notes (Addendum)
Disposition Note  [name], is a 36 y.o. female,   MRN: [id number]  -  DOB - 02-06-1976  Outpatient Primary MD for the patient is Juline Patch, MD Nephrologist:  Dr. Elvis Coil  Blood pressure 121/63, pulse 93, temperature 98.2 F (36.8 C), temperature source Oral, resp. rate 20, last menstrual period 04/06/2012, SpO2 95.00%.  Active Problems:  Chest pain  Anemia  Acute on chronic renal failure    36 yo female with history of failing kidney transplant, TB, malignant ovarian tumor, DM, HTN and hyperlipdemia presents to the ED with increasing weakness and shortness of breath and chest pain.  She had dialysis access placed last Wednesday and at that time her hgb was 6.  Today in the ED it is 4.6 (she has not had an FOBT).  Her troponin is elevated at 1.09 - while this could be from the Renal failure, given her chest pain, I've requested a step down bed  Dr. Hyman Hopes is consulting.   Algis Downs, PA-C [facility] Pager: [phone]

## 2012-06-09 NOTE — Consult Note (Signed)
CARDIOLOGY CONSULT NOTE   Patient ID: [name] MRN: [id number] DOB/AGE: 1975/11/15 36 y.o.  Admit date: 06/09/2012  Primary Physician   Juline Patch, MD Primary Cardiologist   New Reason for Consultation   Elevated troponin  HPI:[name] is a 36 y.o. female with Acute/Chronic renal failure s/p renal transplant in 2010 and currently in the process of restarting dialysis, history of TB and resected malignant ovarian tumor. Patient presents to ED today with SOB, general weakness and chest pain. She has severe anemia, Hgb 4.6, and is currently receiving blood transfusion. She denies current chest pain and dyspnea.   Patient is alone in the exam room and states that her husband will not be coming in to be with her. They have a 89 year old son. She does not have any other family nearby.    Past Medical History  Diagnosis Date  . ESRD (end stage renal disease)     history of peritoneal and Hemodialysis, now s/p renal  transplant  . Renal transplant disorder 2010  . Ovarian tumor, malignant 2005    resected in [place]  . Diabetic retinopathy   . HTN (hypertension)     not well controlled  . 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 HLD (hyperlipidemia)   . CAP (community acquired pneumonia) 2010  . Diabetes mellitus      Past Surgical History  Procedure Date  . Tumor removal 2005  . Av fistula placement   . Eye surgery     right eye   . Kidney transplant   . Av fistula placement 06/03/2012    Procedure: INSERTION OF ARTERIOVENOUS (AV) GORE-TEX GRAFT ARM;  Surgeon: Larina Earthly, MD;  Location: [facility] OR;  Service: Vascular;  Laterality: Left;    Allergies  Allergen Reactions  . Ace Inhibitors Other (See Comments)     cause cough    I have reviewed the patient's current medications    . furosemide  80 mg Intravenous Once       Medication Sig  amLODipine (NORVASC) 10 MG tablet Take 10 mg  by mouth daily.  aspirin EC 81 MG tablet Take 81 mg by mouth daily.  escitalopram (LEXAPRO) 10 MG tablet Take 10 mg by mouth daily.  furosemide (LASIX) 40 MG tablet Take 40 mg by mouth daily.  insulin glargine (LANTUS) 100 UNIT/ML injection Inject 60 Units into the skin at bedtime.  labetalol  100 MG tablet Take 100 mg by mouth 3 (three) times daily.  metoCLOPramide (REGLAN) 5 MG tablet Take 5 mg by mouth 4 (four) times daily.  mycophenolate (CELLCEPT) 250 MG capsule Take 750 mg by mouth 2 (two) times daily.   omeprazole  20 MG capsule Take 20 mg by mouth daily.  predniSONE 5 MG tablet Take 20 mg by mouth daily.  simvastatin  20 MG tablet Take 20 mg by mouth every Monday, Wednesday, and Friday.  sodium bicarbonate 650 MG tablet Take 650 mg by mouth 2 (two) times daily.  sulfamethoxazole-trimethoprim (BACTRIM DS) 800-160 MG per tablet Take 1 tablet by mouth every Monday, Wednesday, and Friday.  tacrolimus (PROGRAF) 1 MG capsule Take 2-3 mg by mouth 2 (two) times daily. 3 capsules in the morning 2 capsules at night     History   Social History  . Marital Status: Married    Spouse Name: N/A    Number of Children: N/A  .  Years of Education: N/A   Occupational History  . Not on file.   Social History Main Topics  . Smoking status: Never Smoker   . Smokeless tobacco: Never Used  . Alcohol Use: No  . Drug Use: No  . Sexually Active: Yes   Other Topics Concern  . Not on file   Social History Narrative   Lives with husband and sonUnemployed     Family History  Problem Relation Age of Onset  . Diabetes Mother   . Liver cancer Father   . Cancer Father      ROS:  Full 14 point review of systems complete and found to be negative unless listed above.  Physical Exam: Blood pressure 121/63, pulse 93, temperature 97.8 F (36.6 C), temperature source Oral, resp. rate 20, last menstrual period 04/06/2012, SpO2 95.00%.  General: Well developed, female appearing weak and ill.  Head:  Normocephalic, eye orbits with hyperpigmentation bilaterally, Dentition - fair.  Lungs: Anteriorly clear to ascultation bilaterally, shallow chest expansion.  Heart: HRRR S1 S2, no rub/gallop, Heart irregular rate and rhythm with S1, S2  Murmur (gallop vs. Murmur vs. S3/S4?). pulses are 1+ in all 4 extrem.   Neck: No carotid bruits. No lymphadenopathy. Non-elevated JVD. Abdomen: Bowel sounds present, abdomen soft and non-tender without masses or hernias noted. Large surgical scar down the midline with multiple smaller scars present. Msk: Weak upper extrem. Extremities: No clubbing or cyanosis. No edema.  Neuro: Lethargic but oriented X 3. No focal deficits noted. Psych:  Lethargic, irritable  Skin: No rashes noted. Many scars noted on abdomen and upper extrem.  Labs:   Lab Results  Component Value Date   WBC 9.1 06/09/2012   HGB 4.6* 06/09/2012   HCT 14.0* 06/09/2012   MCV 85.4 06/09/2012   PLT 247 06/09/2012   No results found for this basename: INR in the last 72 hours  Lab 06/09/12 0922  NA 132*  K 3.6  CL 100  CO2 16*  BUN 49*  CREATININE 5.17*  CALCIUM 8.4  PROT 5.9*  BILITOT 0.2*  ALKPHOS 72  ALT 10  AST 15  GLUCOSE 167*    Basename 06/09/12 0923  CKTOTAL --  CKMB --  TROPONINI 1.09*   Lab Results  Component Value Date   DDIMER 0.66* 06/09/2012   Echo: 10/20/2008 SUMMARY - Overall left ventricular systolic function was normal. Left ventricular ejection fraction was estimated to be 60%. There were no left ventricular regional wall motion abnormalities. Doppler parameters were consistent with abnormal left ventricular relaxation. - The left atrium was mildly dilated. - Estimated peak pulmonary artery systolic pressure was 28 mmHg. - The inferior vena cava was normal. - There was a small posterior pericardial effusion. No hemodynamic compromise. IMPRESSIONS - Normal LV size and systolic function. Mild left atrial enlargement and mild diastolic dysfunction. There is  a small posterior pericardial effusion.  ECG:  09-Jun-2012 09:47:55   SINUS RHYTHM ~ normal P axis, V-rate 50- 99 BORDERLINE REPOLARIZATION ABNORMALITY ~ ST dep & abnormal T Vent. rate 94 BPM PR interval 144 ms QRS duration 78 ms QT/QTc 380/475 ms P-R-T axes 53 77 128  Radiology:  Dg Chest Port 1 View 06/09/2012  *RADIOLOGY REPORT*  Clinical Data: Chest pain and shortness of breath.  PORTABLE CHEST - 1 VIEW  Comparison: PA and lateral chest 06/02/2012.  Findings: There is cardiomegaly and mild interstitial edema.  No pneumothorax, consolidative process or pleural effusion.  No focal bony abnormality.  IMPRESSION: Cardiomegaly and  mild interstitial edema.   Original Report Authenticated By: Bernadene Bell. D'ALESSIO, M.D.     ASSESSMENT AND PLAN:   The patient was seen today by Dr Shirlee Latch, the patient evaluated and the data reviewed.   Chest pain - currently denies, had earlier, in the setting of Hgb 4.6. Continue to cycle enzymes, check echo, MD advise further eval.  Otherwise, per primary MD, Neprhology Active Problems: 1. Severe Anemia - currently receiving 2 units RBCs 2. Acute on chronic renal failure - s/p renal transplant in 2010 at [facility], currently on dialysis 3. HTN   Signed: Theodore Demark 06/09/2012, 12:04 PM Co-Sign MD  Patient seen with PA, agree with note.  Patient has failing renal transplant with significantly elevated creatinine admitted with volume overload and dyspnea.  She has had rare fleeting chest pain, nothing exertional or prolonged.  Troponin was noted to be elevated.  1. Elevated troponin: Suspect demand ischemia in the setting of volume overload (due primarily to renal failure) and profound anemia.  Would cycle cardiac enzymes to peak.  Will get echocardiogram.  2. CHF: Presumed diastolic CHF exacerbation in the setting of renal failure.  She is volume overloaded on exam with dyspnea with minimal exertion.  Continue Lasix 80 IV every 8 hours for now as she is  making urine.  Will defer to Dr. Hyman Hopes re: initiation of dialysis, but suspect this may be needed for volume overloaded.  3. Anemia: No evidence for overt GI bleeding.  Has history of significant baseline anemia, now worse.  Receiving 2 units PRBCs.   4. Renal: Failing transplant, may need dialysis soon for volume overload.  Awaiting renal input.   Marca Ancona 06/09/2012 2:36 PM

## 2012-06-09 NOTE — ED Notes (Signed)
Pt undressed, in gown, on monitor, continuous pulse oximetry, blood pressure cuff and oxygen [place] (2L); EKG performed 

## 2012-06-09 NOTE — Progress Notes (Signed)
Echocardiogram 2D Echocardiogram has been performed.  Evana Runnels 06/09/2012, 4:53 PM

## 2012-06-09 NOTE — ED Notes (Addendum)
Pt resting and tolerating blood transfusion without adverse reaction.  Increasing rate to 100 mL/hr.  Will continue to monitor.

## 2012-06-09 NOTE — ED Notes (Signed)
Pt had left arm AV fistula created last Wed.  Since that time she has had increasing generalized weakness and SOB.  Upon EMS arrival, RA sats were 86%.  Pt placed on 4L O2 via [place] with sats at 96%.  Lung sounds clear.  Kidney transplant in 2010.  No edema observed.

## 2012-06-09 NOTE — ED Notes (Signed)
PA notified of elevated troponin 

## 2012-06-09 NOTE — Consult Note (Signed)
Referral MD Lajuana Carry Reason for Referral: anemia  Chief Complaint  Patient presents with  . Weakness  . Shortness of Breath  : Anemia  HPI:  36 yo [place] woman with hx of DM II, ESRD s/p renal transplant with ongoing CRF, [presents with anemia despite growth factors, requiring multiple transfusions over the past 1- 1.5 yrs. On average she has required blood q 2weeks. There has been no evidence of obvious blood loss. She has not had formal GI investigations. She has been symptomatic from blood loss, with dyspnea, headache and occasional CP.  She has been given IV Iron. Past Medical History  Diagnosis Date  . ESRD (end stage renal disease)     history of peritoneal and Hemodialysis, now s/p renal  transplant  . Renal transplant disorder 2010  . Ovarian tumor, malignant 2005    resected in [place]  . Diabetic retinopathy   . HTN (hypertension)     not well controlled  . 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 HLD (hyperlipidemia)   . CAP (community acquired pneumonia) 2010  . Diabetes mellitus   :  Past Surgical History  Procedure Date  . Tumor removal 2005    Ovarian, resected in [place]  . Av fistula placement   . Eye surgery     right eye   . Kidney transplant 2010    at [facility]  . Av fistula placement 06/03/2012    Procedure: INSERTION OF ARTERIOVENOUS (AV) GORE-TEX GRAFT ARM;  Surgeon: Larina Earthly, MD;  Location: [facility] OR;  Service: Vascular;  Laterality: Left;  :  Current facility-administered medications:0.9 %  sodium chloride infusion, 250 mL, Intravenous, PRN, Maretta Bees, MD;  acetaminophen (TYLENOL) suppository 650 mg, 650 mg, Rectal, Q6H PRN, Maretta Bees, MD;  acetaminophen (TYLENOL) tablet 650 mg, 650 mg, Oral, Q6H PRN, Maretta Bees, MD;  albuterol (PROVENTIL) (5 MG/ML) 0.5% nebulizer solution 2.5 mg, 2.5 mg, Nebulization, Q2H PRN, Maretta Bees,  MD amLODipine (NORVASC) tablet 10 mg, 10 mg, Oral, Daily, Maretta Bees, MD;  aspirin EC tablet 81 mg, 81 mg, Oral, Daily, Maretta Bees, MD, 81 mg at 06/09/12 1528;  darbepoetin (ARANESP) injection 150 mcg, 150 mcg, Subcutaneous, Q Tue-1800, Pierce Crane, MD;  darbepoetin (ARANESP) injection 150 mcg, 150 mcg, Subcutaneous, Once, Shanker Levora Dredge, MD enoxaparin (LOVENOX) injection 40 mg, 40 mg, Subcutaneous, Q24H, Maretta Bees, MD, 40 mg at 06/09/12 1528;  escitalopram (LEXAPRO) tablet 10 mg, 10 mg, Oral, Daily, Maretta Bees, MD;  furosemide (LASIX) injection 80 mg, 80 mg, Intravenous, Once, [employer], PA-C, 80 mg at 06/09/12 1045;  furosemide (LASIX) injection 80 mg, 80 mg, Intravenous, TID, Maretta Bees, MD, 80 mg at 06/09/12 2113 insulin aspart (novoLOG) injection 0-9 Units, 0-9 Units, Subcutaneous, TID WC, Maretta Bees, MD, 2 Units at 06/09/12 1717;  insulin glargine (LANTUS) injection 60 Units, 60 Units, Subcutaneous, QHS, Maretta Bees, MD, 60 Units at 06/09/12 2126;  labetalol (NORMODYNE) tablet 100 mg, 100 mg, Oral, TID, Maretta Bees, MD, 100 mg at 06/09/12 2115 metoCLOPramide (REGLAN) tablet 5 mg, 5 mg, Oral, QID, Maretta Bees, MD, 5 mg at 06/09/12 2114;  mycophenolate (CELLCEPT) capsule 750 mg, 750 mg, Oral, BID, Maretta Bees, MD, 750 mg at 06/09/12 2114;  ondansetron (ZOFRAN) injection 4 mg, 4 mg, Intravenous, Q6H PRN, Maretta Bees, MD;  ondansetron (  ZOFRAN) tablet 4 mg, 4 mg, Oral, Q6H PRN, Maretta Bees, MD pantoprazole (PROTONIX) EC tablet 40 mg, 40 mg, Oral, Q1200, Maretta Bees, MD, 40 mg at 06/09/12 1528;  predniSONE (DELTASONE) tablet 20 mg, 20 mg, Oral, Q breakfast, Maretta Bees, MD;  simvastatin (ZOCOR) tablet 20 mg, 20 mg, Oral, Q M,W,F-1800, Maretta Bees, MD;  sodium bicarbonate tablet 650 mg, 650 mg, Oral, BID, Maretta Bees, MD, 650 mg at 06/09/12 2115 sodium chloride 0.9 % injection 3 mL, 3 mL,  Intravenous, Q12H, Maretta Bees, MD, 3 mL at 06/09/12 2116;  sodium chloride 0.9 % injection 3 mL, 3 mL, Intravenous, Q12H, Shanker M Ghimire, MD;  sodium chloride 0.9 % injection 3 mL, 3 mL, Intravenous, PRN, Maretta Bees, MD;  sulfamethoxazole-trimethoprim (BACTRIM DS) 800-160 MG per tablet 1 tablet, 1 tablet, Oral, Q M,W,F, Shanker Levora Dredge, MD tacrolimus (PROGRAF) capsule 2 mg, 2 mg, Oral, QHS, Maretta Bees, MD, 2 mg at 06/09/12 2115;  tacrolimus (PROGRAF) capsule 3 mg, 3 mg, Oral, Daily, Shanker Levora Dredge, MD;  DISCONTD: tacrolimus (PROGRAF) capsule 2-3 mg, 2-3 mg, Oral, BID, Maretta Bees, MD:     . amLODipine  10 mg Oral Daily  . aspirin EC  81 mg Oral Daily  . darbepoetin (ARANESP) injection - NON-DIALYSIS  150 mcg Subcutaneous Q Tue-1800  . darbepoetin (ARANESP) injection - NON-DIALYSIS  150 mcg Subcutaneous Once  . enoxaparin (LOVENOX) injection  40 mg Subcutaneous Q24H  . escitalopram  10 mg Oral Daily  . furosemide  80 mg Intravenous Once  . furosemide  80 mg Intravenous TID  . insulin aspart  0-9 Units Subcutaneous TID WC  . insulin glargine  60 Units Subcutaneous QHS  . labetalol  100 mg Oral TID  . metoCLOPramide  5 mg Oral QID  . mycophenolate  750 mg Oral BID  . pantoprazole  40 mg Oral Q1200  . predniSONE  20 mg Oral Q breakfast  . simvastatin  20 mg Oral Q M,W,F-1800  . sodium bicarbonate  650 mg Oral BID  . sodium chloride  3 mL Intravenous Q12H  . sodium chloride  3 mL Intravenous Q12H  . sulfamethoxazole-trimethoprim  1 tablet Oral Q M,W,F  . tacrolimus  2 mg Oral QHS  . tacrolimus  3 mg Oral Daily  . DISCONTD: tacrolimus  2-3 mg Oral BID  :  Allergies  Allergen Reactions  . Ace Inhibitors Other (See Comments)     cause cough  :  Family History  Problem Relation Age of Onset  . Diabetes Mother   . Liver cancer Father   . Cancer Father   :  History   Social History  . Marital Status: Married    Spouse Name: N/A    Number of  Children: 1  . Years of Education: N/A   Occupational History  . Unemployed    Social History Main Topics  . Smoking status: Never Smoker   . Smokeless tobacco: Never Used  . Alcohol Use: No  . Drug Use: No  . Sexually Active: Yes   Other Topics Concern  . Not on file   Social History Narrative   Lives with husband and son (11yrs old)..Unemployed  :  ROS C/o soboe, Fatigue  Exam: Patient Vitals for the past 24 hrs:  BP Temp Temp src Pulse Resp SpO2 Height Weight  06/09/12 2100 128/70 mmHg - - 94  - 97 % - -  06/09/12 2012 - 98.1  F (36.7 C) Oral - - - - -  06/09/12 2000 152/66 mmHg - - 90  19  97 % - -  06/09/12 1900 122/59 mmHg - - 92  27  97 % - -  06/09/12 1800 123/63 mmHg - - 93  28  97 % - -  06/09/12 1700 131/72 mmHg - - 95  19  97 % - -  06/09/12 1615 - 98.7 F (37.1 C) - - - - - -  06/09/12 1600 133/62 mmHg - - 96  25  99 % - -  06/09/12 1553 - 99.1 F (37.3 C) - 96  25  99 % - -  06/09/12 1515 - 98.8 F (37.1 C) Oral - - - - -  06/09/12 1500 132/62 mmHg 98.7 F (37.1 C) Oral 97  25  99 % - -  06/09/12 1400 131/67 mmHg - - 97  24  95 % - -  06/09/12 1300 - - - - - - 5\' 7"  (1.702 m) 183 lb 13.8 oz (83.4 kg)  06/09/12 1200 115/53 mmHg 97.8 F (36.6 C) Oral 95  26  97 % - -  06/09/12 1143 - 98.8 F (37.1 C) Oral - - - - -  06/09/12 1000 120/66 mmHg - - 90  24  99 % - -  06/09/12 0948 121/63 mmHg - - 93  20  95 % - -  06/09/12 0900 115/60 mmHg - - 89  - 98 % - -  06/09/12 0850 - - - - - 96 % - -  06/09/12 0846 117/56 mmHg 98.2 F (36.8 C) Oral 89  26  96 % - -  06/09/12 0841 - - - - - 96 % - -   Cushingoid woman, NAD HEENT:  Sclerae anicteric, conjunctivae pink.  Oropharynx clear.  No mucositis or candidiasis.  Nodes:  No cervical, supraclavicular, or axillary lymphadenopathy palpated.  Lungs:  Clear to auscultation bilaterally.  No crackles, rhonchi, or wheezes.  Heart:  Regular rate and rhythm.  Abdomen:  Soft, nontender.  Positive bowel sounds.  No  organomegaly or masses palpated.  Musculoskeletal:  No focal spinal tenderness to palpation.  Extremities:  Benign.  1+  edema or cyanosis.  Skin:  Benign.  Neuro:  Nonfocal.     Basename 06/09/12 2014 06/09/12 0922  WBC 8.1 9.1  HGB 7.2* 4.6*  HCT 20.7* 14.0*  PLT 237 247    Basename 06/09/12 2014 06/09/12 0922  NA -- 132*  K -- 3.6  CL -- 100  CO2 -- 16*  GLUCOSE -- 167*  BUN -- 49*  CREATININE 5.22* 5.17*  CALCIUM -- 8.4    Blood smear review- polychromasia, few schistocytes, ovalocytes, poikolocytosis Pathology:  Dg Chest 2 View  06/02/2012  *RADIOLOGY REPORT*  Clinical Data: Preop for dialysis catheter placement.  CHEST - 2 VIEW  Comparison: Chest x-ray 10/23/2008.  Findings: The cardiac silhouette, mediastinal and hilar contours are within normal limits and stable.  There are stable upper lobe scarring changes bilaterally.  No infiltrates, edema or effusions. The bony thorax is intact.  IMPRESSION: No acute cardiopulmonary findings. Stable upper lobe scarring changes.   Original Report Authenticated By: P. Loralie Champagne, M.D.    Dg Chest Port 1 View  06/09/2012  *RADIOLOGY REPORT*  Clinical Data: Chest pain and shortness of breath.  PORTABLE CHEST - 1 VIEW  Comparison: PA and lateral chest 06/02/2012.  Findings: There is cardiomegaly and mild interstitial edema.  No pneumothorax, consolidative  process or pleural effusion.  No focal bony abnormality.  IMPRESSION: Cardiomegaly and mild interstitial edema.   Original Report Authenticated By: Bernadene Bell. Maricela Curet, M.D.     Assessment and Plan:   Anemia- This pt has had symptomatic anemia requiring frequent blood transfusions, despite epogen injections. i would recommend GI investigation . Current labs do not point to hemolysis as an etiology. I would check other parameters, ie TSH, B12 etc. She may ultimately require a bone marrow to ensure adequate erthyroid precursors are present. In addition she may benefit from using procrit  on a more frequent schedule. It is interesting that her increasing blood requirements corresponds with her  increasing creatinine and possible rejections.  I will continue to follow. Pierce Crane MD  [phone]

## 2012-06-10 ENCOUNTER — Inpatient Hospital Stay (HOSPITAL_COMMUNITY): Payer: Medicare Other

## 2012-06-10 ENCOUNTER — Other Ambulatory Visit: Payer: Self-pay | Admitting: Oncology

## 2012-06-10 DIAGNOSIS — D649 Anemia, unspecified: Secondary | ICD-10-CM

## 2012-06-10 LAB — BASIC METABOLIC PANEL
BUN: 49 mg/dL — ABNORMAL HIGH (ref 6–23)
Calcium: 8.6 mg/dL (ref 8.4–10.5)
Creatinine, Ser: 5.37 mg/dL — ABNORMAL HIGH (ref 0.50–1.10)
GFR calc Af Amer: 11 mL/min — ABNORMAL LOW (ref >90)
GFR calc non Af Amer: 9 mL/min — ABNORMAL LOW (ref >90)
Glucose, Bld: 108 mg/dL — ABNORMAL HIGH (ref 70–99)
Potassium: 3.4 mEq/L — ABNORMAL LOW (ref 3.5–5.1)

## 2012-06-10 LAB — GLUCOSE, CAPILLARY
Glucose-Capillary: 67 mg/dL — ABNORMAL LOW (ref 70–99)
Glucose-Capillary: 70 mg/dL (ref 70–99)
Glucose-Capillary: 360 mg/dL — ABNORMAL HIGH (ref 70–99)

## 2012-06-10 LAB — TYPE AND SCREEN
ABO/RH(D): AB POS
Antibody Screen: NEGATIVE
Unit division: 0
Unit division: 0

## 2012-06-10 LAB — TROPONIN I: Troponin I: 1.89 ng/mL (ref <0.30)

## 2012-06-10 LAB — VITAMIN B12: Vitamin B-12: 436 pg/mL (ref 211–911)

## 2012-06-10 LAB — CBC
HCT: 21.5 % — ABNORMAL LOW (ref 36.0–46.0)
Hemoglobin: 7.5 g/dL — ABNORMAL LOW (ref 12.0–15.0)
MCH: 30.1 pg (ref 26.0–34.0)
MCHC: 34.9 g/dL (ref 30.0–36.0)
RDW: 14.9 % (ref 11.5–15.5)

## 2012-06-10 LAB — HAPTOGLOBIN: Haptoglobin: 204 mg/dL — ABNORMAL HIGH (ref 45–215)

## 2012-06-10 LAB — HEMOGLOBIN A1C: Hgb A1c MFr Bld: 6.4 % — ABNORMAL HIGH (ref <5.7)

## 2012-06-10 LAB — TSH: TSH: 2.97 u[IU]/mL (ref 0.350–4.500)

## 2012-06-10 LAB — GLUCOSE, RANDOM: Glucose, Bld: 466 mg/dL — ABNORMAL HIGH (ref 70–99)

## 2012-06-10 MED ORDER — INSULIN ASPART 100 UNIT/ML ~~LOC~~ SOLN
14.0000 [IU] | Freq: Once | SUBCUTANEOUS | Status: AC
  Administered 2012-06-10: 14 [IU] via SUBCUTANEOUS

## 2012-06-10 MED ORDER — ZOLPIDEM TARTRATE 5 MG PO TABS
5.0000 mg | ORAL_TABLET | Freq: Once | ORAL | Status: AC
  Administered 2012-06-10: 5 mg via ORAL
  Filled 2012-06-10: qty 1

## 2012-06-10 MED ORDER — INSULIN GLARGINE 100 UNIT/ML ~~LOC~~ SOLN
60.0000 [IU] | Freq: Every day | SUBCUTANEOUS | Status: DC
  Administered 2012-06-10: 60 [IU] via SUBCUTANEOUS

## 2012-06-10 MED ORDER — INSULIN ASPART 100 UNIT/ML ~~LOC~~ SOLN
0.0000 [IU] | Freq: Three times a day (TID) | SUBCUTANEOUS | Status: DC
  Administered 2012-06-11 (×2): 7 [IU] via SUBCUTANEOUS
  Administered 2012-06-12: 3 [IU] via SUBCUTANEOUS

## 2012-06-10 MED ORDER — INSULIN ASPART 100 UNIT/ML ~~LOC~~ SOLN
3.0000 [IU] | Freq: Three times a day (TID) | SUBCUTANEOUS | Status: DC
  Administered 2012-06-11 – 2012-06-12 (×3): 3 [IU] via SUBCUTANEOUS

## 2012-06-10 MED ORDER — INSULIN GLARGINE 100 UNIT/ML ~~LOC~~ SOLN: 50.0000 [IU] | Freq: Every day | SUBCUTANEOUS | Status: DC

## 2012-06-10 NOTE — Progress Notes (Signed)
I reviewed the notes and recent labs on this patient. She seems to have an adequate reticulocyte count. There is no obvious evidence for hemolysis and other labs i.e. PNH  screen is pending. I believe that a bone marrow biopsy would be appropriate to rule out underlying intrinsic marrow problems. I will ask interventional radiology to do this. I will be away for the next 2 days. Please call our office at [phone] if there any other questions.

## 2012-06-10 NOTE — Progress Notes (Signed)
Patient Name: [name] Date of Encounter: 06/10/2012  Principal Problem:  *Acute on chronic renal failure Active Problems:  HYPERLIPIDEMIA  HYPERTENSION  Shortness of breath  Anemia  DM (diabetes mellitus)  Fatigue  Chest pain  Pulmonary edema  Elevated troponin level    SUBJECTIVE: Patient is laying in bed comfortably. States she is feeling better and stronger. Denies chest pain, dyspnea, lightheadedness,  Hemotologist consulted yesterday, recommends GI investigation.   OBJECTIVE Filed Vitals:   06/10/12 0000 06/10/12 0359 06/10/12 0400 06/10/12 0449  BP: 138/65  150/56   Pulse: 99  94   Temp:  98.3 F (36.8 C)    TempSrc:  Oral    Resp: 20  20   Height:      Weight:    177 lb 11.1 oz (80.6 kg)  SpO2: 95%  100%     Intake/Output Summary (Last 24 hours) at 06/10/12 0715 Last data filed at 06/10/12 0519  Gross per 24 hour  Intake 767.25 ml  Output      0 ml  Net 767.25 ml   Filed Weights   06/09/12 1300 06/10/12 0449  Weight: 183 lb 13.8 oz (83.4 kg) 177 lb 11.1 oz (80.6 kg)     PHYSICAL EXAM General: Well developed, well nourished, in no acute distress. Head: Normocephalic, atraumatic.  Neck: Supple without bruits, JVD. Lungs:  Resp regular and unlabored, CTA. Heart: RRR, S1, S2, no S3, S4, or 1/6 SEM RUSB. Abdomen: Soft, non-tender, non-distended, BS + x 4.  Extremities: No clubbing, cyanosis, edema.  Neuro: Alert and oriented X 3. Moves all extremities spontaneously. Psych: Normal affect.  LABS: CBC: Basename 06/10/12 0230 06/09/12 2014 06/09/12 0922  WBC 7.6 8.1 --  NEUTROABS -- 6.2 7.4  HGB 7.5* 7.2* --  HCT 21.5* 20.7* --  MCV 86.3 85.9 --  PLT 258 237 --   INR:No results found for this basename: INR in the last 72 hours Basic Metabolic Panel: Basename 06/10/12 0230 06/09/12 2014 06/09/12 0922  NA 139 -- 132*  K 3.4* -- 3.6  CL 103 -- 100  CO2 18* -- 16*  GLUCOSE 108* -- 167*  BUN 49* -- 49*  CREATININE 5.37* 5.22* --  CALCIUM 8.6  -- 8.4  MG -- -- --  PHOS -- -- --   Liver Function Tests: [facility] 06/09/12 0922  AST 15  ALT 10  ALKPHOS 72  BILITOT 0.2*  PROT 5.9*  ALBUMIN 3.0*   Cardiac Enzymes: Basename 06/10/12 0134 06/09/12 2014 06/09/12 0923  CKTOTAL -- -- --  CKMB -- -- --  CKMBINDEX -- -- --  TROPONINI 1.89* 1.60* 1.09*   D-dimer: Basename 06/09/12 0922  DDIMER 0.66*   Hemoglobin A1C: Basename 06/09/12 2014  HGBA1C 6.4*   Fasting Lipid Panel:No results found for this basename: CHOL,HDL,LDLCALC,TRIG,CHOLHDL,LDLDIRECT in the last 72 hours Thyroid Function Tests:No results found for this basename: TSH,T4TOTAL,FREET3,T3FREE,THYROIDAB in the last 72 hours Anemia Panel: Basename 06/10/12 0230  VITAMINB12 --  FOLATE --  FERRITIN --  TIBC --  IRON --  RETICCTPCT 4.4*   Echo: 06/09/2012 EF 55-60% Impressions:- The right ventricular systolic pressure was increased consistent with moderate pulmonary hypertension.  TELE:  NSR, ST-sloping?  ECG: NSR  (06/09/2012)  Radiology/Studies: Dg Chest Port 1 View  06/09/2012  *RADIOLOGY REPORT*  Clinical Data: Chest pain and shortness of breath.  PORTABLE CHEST - 1 VIEW  Comparison: PA and lateral chest 06/02/2012.  Findings: There is cardiomegaly and mild interstitial edema.  No pneumothorax, consolidative  process or pleural effusion.  No focal bony abnormality.  IMPRESSION: Cardiomegaly and mild interstitial edema.   Original Report Authenticated By: Bernadene Bell. Maricela Curet, M.D.     Current Medications:     . amLODipine  10 mg Oral Daily  . aspirin EC  81 mg Oral Daily  . darbepoetin (ARANESP) injection - NON-DIALYSIS  150 mcg Subcutaneous Q Tue-1800  . darbepoetin (ARANESP) injection - NON-DIALYSIS  150 mcg Subcutaneous Once  . enoxaparin (LOVENOX) injection  40 mg Subcutaneous Q24H  . escitalopram  10 mg Oral Daily  . furosemide  80 mg Intravenous Once  . furosemide  80 mg Intravenous TID  . insulin aspart  0-9 Units Subcutaneous TID WC  . insulin  glargine  60 Units Subcutaneous QHS  . labetalol  100 mg Oral TID  . metoCLOPramide  5 mg Oral QID  . mycophenolate  750 mg Oral BID  . pantoprazole  40 mg Oral Q1200  . predniSONE  20 mg Oral Q breakfast  . simvastatin  20 mg Oral Q M,W,F-1800  . sodium bicarbonate  650 mg Oral BID  . sodium chloride  3 mL Intravenous Q12H  . sodium chloride  3 mL Intravenous Q12H  . sulfamethoxazole-trimethoprim  1 tablet Oral Q M,W,F  . tacrolimus  2 mg Oral QHS  . tacrolimus  3 mg Oral Daily  . zolpidem  5 mg Oral Once  . DISCONTD: tacrolimus  2-3 mg Oral BID      ASSESSMENT AND PLAN: Principal Problem:  *Acute on chronic renal failure  Active Problems:  HYPERLIPIDEMIA  HYPERTENSION  Shortness of breath  Anemia  DM (diabetes mellitus)  Fatigue  Chest pain  Pulmonary edema  Elevated troponin level - Trending up   Signed, Theodore Demark , PA-C 7:15 AM 06/10/2012  Patient seen with PA, agree with the above note.  She feels better after transfusion.  She has a low-level troponin elevation.  Echocardiogram showed normal EF with mild pulmonary hypertension.  The pulmonary hypertension could be a manifestation of high output with profound anemia. At this point, would continue investigation of anemia and renal evaluation.  Elevated troponin is most likely demand ischemia due to profound anemia in the setting of CKD.  No chest pain.  Would repeat echo in the future when hemoglobin is improved to see if pulmonary HTN has resolved.   Marca Ancona 06/10/2012 8:20 AM

## 2012-06-10 NOTE — Progress Notes (Signed)
Report given to [facility], RN on unit [id number]. Pt going to Rm [id number].

## 2012-06-10 NOTE — Progress Notes (Addendum)
[facility] Progress Note [name] TEAM 1 - Stepdown/ICU TEAM   [name] EXB:[id number] DOB: Apr 15, 1976 DOA: 06/09/2012 PCP: Juline Patch, MD  Brief narrative: 36 year old female who has a past medical history of renal transplant and is on immune suppressive therapy, per review of prior medical records she has been somewhat noncompliant with her meds and to followup, worsening anemia inspite of Epogen therapy and requiring numerous blood transfusions recently presents to the hospital with SOB/fatigue. The patient's last blood transfusion was around 2 weeks ago, she has had worsening of her chronic kidney failure recently and has had a AV fistula placed recently, claims that for the past 2-3 days she has been feeling very fatigued and tired. In the emergency room, patient underwent a chest x-ray which showed mild interstitial edema, her CBC showed a hemoglobin of 4.6, and I was subsequently asked to admit this patient for further evaluation and treatment. During my evaluation, patient was comfortable, her O2 saturations were in the mid 90s on 3 L of oxygen via nasal cannula.   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) Status post placement of AV fistula 06/03/2012 - nephrology following  Dyspnea / acute pulmonary edema Likely due to very low oncotic pressure in setting of severe anemia with renal failure - much improved symptomatically and on exam status post transfusion  Severe anemia - etiology unclear per Dr Hyman Hopes patient has had no appropriate response to Epogen and IV Iron infusion which he has commenced in the past - on average she has required blood q 2weeks - see comments per hematology - hemoglobin improved to 7.5 from nadir of 4.6 status post 2 units packed red blood cells - haptoglobin not consistent with hemolysis - check B12 and folic acid levels - guaiac stool - noncontrasted CT of abdomen to rule out  retroperitoneal hemorrhage  Elevated troponin Cardiology has evaluated - no further cardiac workup indicated  Hypertension Reasonably controlled at the present time - continue to follow without change in treatment today  History of pulmonary tuberculosis status post treatment 2007 No symptoms to suggest acute issues  Hyperlipidemia Continue Zocor therapy  Diabetes mellitus CBG well controlled with exception to occasional hypoglycemia - continue to follow trend - A1c is noted to be 6.4  Code Status: Full Disposition Plan: Stable for transfer to renal unit  Consultants: Cardiology Hematology Nephrology  Procedures: Echocardiogram 06/09/2012 - normal EF - mild pulmonary hypertension  Antibiotics: None  DVT prophylaxis: SCDs only until clear that pt is not suffering w/ GIB  HPI/Subjective: The patient is alert and interactive today.  She denies fevers chills nausea vomiting shortness of breath or abdominal pain.  She denies hematochezia or melena.   Objective: Blood pressure 140/63, pulse 94, temperature 98.6 F (37 C), temperature source Oral, resp. rate 20, height 5\' 7"  (1.702 m), weight 80.6 kg (177 lb 11.1 oz), last menstrual period 04/06/2012, SpO2 98.00%.  Intake/Output Summary (Last 24 hours) at 06/10/12 1136 Last data filed at 06/10/12 0850  Gross per 24 hour  Intake 895.25 ml  Output   1450 ml  Net -554.75 ml     Exam: General: No acute respiratory distress Lungs: Clear to auscultation bilaterally without wheezes or crackles Cardiovascular: Regular rate and rhythm without murmur gallop or rub normal S1 and S2 Abdomen: Nontender, nondistended, soft, bowel sounds positive, no rebound, no ascites, no appreciable mass Extremities: No significant cyanosis, clubbing, or edema bilateral lower extremities  Data  Reviewed: Basic Metabolic Panel:  Lab 06/10/12 4540 06/09/12 2014 06/09/12 0922  NA 139 -- 132*  K 3.4* -- 3.6  CL 103 -- 100  CO2 18* -- 16*    GLUCOSE 108* -- 167*  BUN 49* -- 49*  CREATININE 5.37* 5.22* 5.17*  CALCIUM 8.6 -- 8.4  MG -- -- --  PHOS -- -- --   Liver Function Tests:  Lab 06/09/12 0922  AST 15  ALT 10  ALKPHOS 72  BILITOT 0.2*  PROT 5.9*  ALBUMIN 3.0*   CBC:  Lab 06/10/12 0230 06/09/12 2014 06/09/12 0922 06/03/12 1318  WBC 7.6 8.1 9.1 5.1  NEUTROABS -- 6.2 7.4 --  HGB 7.5* 7.2* 4.6* 6.0*  HCT 21.5* 20.7* 14.0* 17.3*  MCV 86.3 85.9 85.4 82.0  PLT 258 237 247 210   Cardiac Enzymes:  Lab 06/10/12 0134 06/09/12 2014 06/09/12 0923  CKTOTAL -- -- --  CKMB -- -- --  CKMBINDEX -- -- --  TROPONINI 1.89* 1.60* 1.09*   CBG:  Lab 06/10/12 0809 06/10/12 0808 06/09/12 2124 06/09/12 1646 06/09/12 1421  GLUCAP 70 67* 181* 161* 148*    Recent Results (from the past 240 hour(s))  SURGICAL PCR SCREEN     Status: Normal   Collection Time   06/02/12  8:44 AM      Component Value Range Status Comment   MRSA, PCR NEGATIVE  NEGATIVE Final    Staphylococcus aureus NEGATIVE  NEGATIVE Final      Studies:  Recent x-ray studies have been reviewed in detail by the Attending Physician  Scheduled Meds:  Reviewed in detail by the Attending Physician   Lonia Blood, MD [facility] Office  [phone] Pager [phone]  On-Call/Text Page:      [email or web address]      password [email or web address]  If 7PM-7AM, please contact night-coverage [email or web address] Password [email or web address] 06/10/2012, 11:36 AM   LOS: 1 day

## 2012-06-10 NOTE — Consult Note (Signed)
I have seen and examined this patient and agree with the plan of care. Immunosuppressant doses same except prednisone now at 20mg . Chronic allograft nephropathy and failing transplant but no indications for dialysis at present. Anemia out of proportion to renal disease and no obvious source of blood loss will check abdominal CT for possible retroperitoneal bleed. Hematology on board.  Jalen Daluz W 06/10/2012, 4:34 PM

## 2012-06-10 NOTE — Consult Note (Signed)
[facility] Consult Note [facility]  Date: 06/10/2012  Patient name: [name] Medical record number: [id number] Date of birth: Jun 16, 1976 Age: 36 y.o. Gender: female PCP: Alicia Patch, MD  Medical Service: Internal Medicine  Conulting physician: Dr. Jerral [name]      Chief Complaint: SOB and Fatigue  History of Present Illness: [name] is a 36 year old [place] female with a history of kidney transplant with chronic allograft nephropathy.  She c/o chronic anemia and fatigue which has gotten worse over the past week and prompted her to come to the hospital.  She had a new AV graft placed on Jun 03, 2012 but denies significant bleeding with this procedure.  She has been receiving Aranesp weekly and has gotten a number of blood transfusions, the last one being two weeks ago.  She reports difficulty breathing with some chest congestion.  She denies any abdominal pain, vomiting, diarrhea, or blood stools.  At admission her hemoglobin was 4.6. After 2 units PRBCs and 150 mcg Aranesp, it is now increased to 7.5.  Meds: Prior to Admission medications   Medication Sig Start Date End Date Taking? Authorizing Provider  amLODipine (NORVASC) 10 MG tablet Take 10 mg by mouth daily.   Yes Historical Provider, MD  aspirin EC 81 MG tablet Take 81 mg by mouth daily.   Yes Historical Provider, MD  escitalopram (LEXAPRO) 10 MG tablet Take 10 mg by mouth daily.   Yes Historical Provider, MD  furosemide (LASIX) 40 MG tablet Take 40 mg by mouth daily.   Yes Historical Provider, MD  insulin glargine (LANTUS) 100 UNIT/ML injection Inject 60 Units into the skin at bedtime.   Yes Historical Provider, MD  labetalol (NORMODYNE) 100 MG tablet Take 100 mg by mouth 3 (three) times daily.   Yes Historical Provider, MD  metoCLOPramide (REGLAN) 5 MG tablet Take 5 mg by mouth 4 (four) times daily.   Yes Historical Provider, MD  mycophenolate (CELLCEPT) 250 MG capsule Take 750 mg by mouth  2 (two) times daily.    Yes Historical Provider, MD  omeprazole (PRILOSEC) 20 MG capsule Take 20 mg by mouth daily. 05/07/12  Yes Historical Provider, MD  predniSONE (DELTASONE) 5 MG tablet Take 20 mg by mouth daily.   Yes Historical Provider, MD  simvastatin (ZOCOR) 20 MG tablet Take 20 mg by mouth every Monday, Wednesday, and Friday.   Yes Historical Provider, MD  sodium bicarbonate 650 MG tablet Take 650 mg by mouth 2 (two) times daily.   Yes Historical Provider, MD  sulfamethoxazole-trimethoprim (BACTRIM DS) 800-160 MG per tablet Take 1 tablet by mouth every Monday, Wednesday, and Friday.   Yes Historical Provider, MD  tacrolimus (PROGRAF) 1 MG capsule Take 2-3 mg by mouth 2 (two) times daily. 3 capsules in the morning 2 capsules at night   Yes Historical Provider, MD   Current facility-administered medications:acetaminophen (TYLENOL) suppository 650 mg, 650 mg, Rectal, Q6H PRN, Alicia Bees, MD;  acetaminophen (TYLENOL) tablet 650 mg, 650 mg, Oral, Q6H PRN, Alicia Bees, MD;  albuterol (PROVENTIL) (5 MG/ML) 0.5% nebulizer solution 2.5 mg, 2.5 mg, Nebulization, Q2H PRN, Alicia Bees, MD;  amLODipine (NORVASC) tablet 10 mg, 10 mg, Oral, Daily, Alicia Bees, MD, 10 mg at 06/10/12 0849 aspirin EC tablet 81 mg, 81 mg, Oral, Daily, Alicia Bees, MD, 81 mg at 06/10/12 0850;  darbepoetin (ARANESP) injection 150 mcg, 150 mcg, Subcutaneous, Q Tue-1800, Alicia Crane, MD;  darbepoetin [facility]) injection 150  mcg, 150 mcg, Subcutaneous, Once, Alicia Bees, MD, 150 mcg at 06/09/12 2300;  escitalopram (LEXAPRO) tablet 10 mg, 10 mg, Oral, Daily, Alicia Bees, MD, 10 mg at 06/10/12 0850 furosemide (LASIX) injection 80 mg, 80 mg, Intravenous, TID, Alicia Bees, MD, 80 mg at 06/10/12 0850;  insulin aspart (novoLOG) injection 0-9 Units, 0-9 Units, Subcutaneous, TID WC, Alicia Bees, MD, 1 Units at 06/10/12 1212;  insulin glargine (LANTUS) injection 60 Units, 60 Units,  Subcutaneous, QHS, Alicia Bees, MD, 60 Units at 06/09/12 2126 labetalol (NORMODYNE) tablet 100 mg, 100 mg, Oral, TID, Alicia Bees, MD, 100 mg at 06/10/12 0851;  metoCLOPramide (REGLAN) tablet 5 mg, 5 mg, Oral, QID, Alicia Bees, MD, 5 mg at 06/10/12 0851;  mycophenolate (CELLCEPT) capsule 750 mg, 750 mg, Oral, BID, Alicia Bees, MD, 750 mg at 06/10/12 0852;  ondansetron (ZOFRAN) injection 4 mg, 4 mg, Intravenous, Q6H PRN, Alicia Bees, MD ondansetron (ZOFRAN) tablet 4 mg, 4 mg, Oral, Q6H PRN, Alicia Bees, MD;  pantoprazole (PROTONIX) EC tablet 40 mg, 40 mg, Oral, Q1200, Alicia Bees, MD, 40 mg at 06/10/12 1212;  predniSONE (DELTASONE) tablet 20 mg, 20 mg, Oral, Q breakfast, Alicia Bees, MD, 20 mg at 06/10/12 0849;  simvastatin (ZOCOR) tablet 20 mg, 20 mg, Oral, Q M,W,F-1800, Alicia Bees, MD sodium bicarbonate tablet 650 mg, 650 mg, Oral, BID, Alicia Bees, MD, 650 mg at 06/10/12 0851;  sulfamethoxazole-trimethoprim (BACTRIM DS) 800-160 MG per tablet 1 tablet, 1 tablet, Oral, Q M,W,F, Alicia Bees, MD, 1 tablet at 06/10/12 0851;  tacrolimus (PROGRAF) capsule 2 mg, 2 mg, Oral, QHS, Alicia Bees, MD, 2 mg at 06/09/12 2115;  tacrolimus (PROGRAF) capsule 3 mg, 3 mg, Oral, Daily, Alicia Bees, MD, 3 mg at 06/10/12 0852 zolpidem (AMBIEN) tablet 5 mg, 5 mg, Oral, Once, Alicia More, NP, 5 mg at 06/10/12 0053;  DISCONTD: 0.9 %  sodium chloride infusion, 250 mL, Intravenous, PRN, Alicia Bees, MD;  DISCONTD: enoxaparin (LOVENOX) injection 40 mg, 40 mg, Subcutaneous, Q24H, Alicia Bees, MD, 40 mg at 06/09/12 1528;  DISCONTD: sodium chloride 0.9 % injection 3 mL, 3 mL, Intravenous, Q12H, Alicia Bees, MD, 3 mL at 06/10/12 4540 DISCONTD: sodium chloride 0.9 % injection 3 mL, 3 mL, Intravenous, Q12H, Alicia Levora Dredge, MD;  DISCONTD: sodium chloride 0.9 % injection 3 mL, 3 mL, Intravenous, PRN, Alicia Bees, MD  Allergies: Ace  inhibitors  Past Medical History  Diagnosis Date  . ESRD (end stage renal disease)     history of peritoneal and Hemodialysis, now s/p renal  transplant  . Renal transplant disorder 2010  . Ovarian tumor, malignant 2005    resected in [place]  . Diabetic retinopathy   . HTN (hypertension)     not well controlled  . 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 HLD (hyperlipidemia)   . CAP (community acquired pneumonia) 2010  . Diabetes mellitus    Past Surgical History  Procedure Date  . Tumor removal 2005    Ovarian, resected in [place]  . Av fistula placement   . Eye surgery     right eye   . Kidney transplant 2010    at [facility]  . Av fistula placement 06/03/2012    Procedure: INSERTION OF ARTERIOVENOUS (AV) GORE-TEX GRAFT ARM;  Surgeon: Larina Earthly,  MD;  Location: [facility] OR;  Service: Vascular;  Laterality: Left;   Family History  Problem Relation Age of Onset  . Diabetes Mother   . Liver cancer Father   . Cancer Father    History   Social History  . Marital Status: Married    Spouse Name: N/A    Number of Children: 1  . Years of Education: N/A   Occupational History  . Unemployed    Social History Main Topics  . Smoking status: Never Smoker   . Smokeless tobacco: Never Used  . Alcohol Use: No  . Drug Use: No  . Sexually Active: Yes   Other Topics Concern  . Not on file   Social History Narrative   Lives with husband and son (31yrs old)..Unemployed    Review of Systems: Constitutional: positive for fatigue Respiratory: positive for dyspnea on exertion and chest congestion.  Negative for hemoptysis. Cardiovascular: positive for chest pressure/discomfort, dyspnea and fatigue Gastrointestinal: negative for abdominal pain, diarrhea, jaundice and vomiting Genitourinary:negative Hematologic/lymphatic: negative for bleeding and petechiae Musculoskeletal:negative for bone  pain Neurological: no confusion, pt oriented x3 Behavioral/Psych: appropriate affect  Physical Exam: Blood pressure 129/68, pulse 92, temperature 98.7 F (37.1 C), temperature source Oral, resp. rate 20, height 5\' 7"  (1.702 m), weight 80.6 kg (177 lb 11.1 oz), last menstrual period 04/06/2012, SpO2 97.00%.  General appearance : Pt is alert, sitting quietly in her bed.  She is in no acute distess Neck: no JVD noted. Chest: breathing unlabored, no use of accessory muscles.   CVS: Regular rate and rhythm  Abdomen: no abdominal distention, tenderness or rigidity.    Extremities: mild 1+ edema in lower extremities.  AV graft to left arm with palpable pulse.  No bruising noted surrounding AV graft on left arm.   Neurology: Awake alert, and oriented X 3,  Psych: appropriate affect   Lab results: Basic Metabolic Panel:  Lab 06/10/12 2956 06/09/12 2014 06/09/12 0922  NA 139 -- 132*  K 3.4* -- 3.6  CL 103 -- 100  CO2 18* -- 16*  GLUCOSE 108* -- 167*  BUN 49* -- 49*  CREATININE 5.37* 5.22* 5.17*  CALCIUM 8.6 -- 8.4  ALB -- -- --  PHOS -- -- --   Liver Function Tests:  Lab 06/09/12 0922  AST 15  ALT 10  ALKPHOS 72  BILITOT 0.2*  PROT 5.9*  ALBUMIN 3.0*   CBC:  Lab 06/10/12 0230 06/09/12 2014 06/09/12 0922  WBC 7.6 8.1 9.1  NEUTROABS -- 6.2 7.4  HGB 7.5* 7.2* 4.6*  HCT 21.5* 20.7* 14.0*  MCV 86.3 85.9 85.4  PLT 258 237 247   Blood Culture    Component Value Date/Time   SDES BLOOD HEMODIALYSIS CATHETER ARTERIAL DRAW 12/18/2008 0745   SPECREQUEST BOTTLES DRAWN AEROBIC AND ANAEROBIC 10CC 12/18/2008 0745   CULT NO GROWTH 5 DAYS 12/18/2008 0745   REPTSTATUS 12/24/2008 FINAL 12/18/2008 0745    Cardiac Enzymes:  Lab 06/10/12 0134 06/09/12 2014 06/09/12 0923  CKTOTAL -- -- --  CKMB -- -- --  CKMBINDEX -- -- --  TROPONINI 1.89* 1.60* 1.09*   CBG:  Lab 06/10/12 1140 06/10/12 0809 06/10/12 0808 06/09/12 2124 06/09/12 1646  GLUCAP 136* 70 67* 181* 161*   Micro  Results: Recent Results (from the past 240 hour(s))  SURGICAL PCR SCREEN     Status: Normal   Collection Time   06/02/12  8:44 AM      Component Value Range Status Comment   MRSA,  PCR NEGATIVE  NEGATIVE Final    Staphylococcus aureus NEGATIVE  NEGATIVE Final    Studies/Results: Dg Chest Port 1 View  06/09/2012  *RADIOLOGY REPORT*  Clinical Data: Chest pain and shortness of breath.  PORTABLE CHEST - 1 VIEW  Comparison: PA and lateral chest 06/02/2012.  Findings: There is cardiomegaly and mild interstitial edema.  No pneumothorax, consolidative process or pleural effusion.  No focal bony abnormality.  IMPRESSION: Cardiomegaly and mild interstitial edema.   Original Report Authenticated By: Bernadene Bell. Maricela Curet, M.D.    Assessment & Plan by Problem: 1. 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  Pt taking 80 mg IV lasix TID, with a net urine output of 1.3 L since admission.  New AV graft in place in preparation for future dialysis but no need for dialysis at this time.  Cr  Increased to 5.37 from 4.77 on pt's office visit on 7/30.  Monitor K+ on the low side at 3.4.  Check renal function panel in am.    2. Anemia- unsure of etiologyt  Hematology following.  fecal occult blood in process.  Recommend Abdominal CT to assess for blood loss in abdomen.    3. Elevated Troponin- due to demand ischemia from severe anemia  4. Pulmonary Edema- Chest x-ray showed cardiomegaly and mild interstitial edema.  Lasix working well with a net fluid loss of 1.3 L since admission.    This is a Psychologist, occupational Note.  The care of the patient was discussed with Dr. Hyman Hopes and the assessment and plan was formulated with their assistance.  Please see their note for official documentation of the patient encounter.   Signed: Delmer Islam, PA-S2 06/10/2012, 2:36 PM

## 2012-06-11 ENCOUNTER — Encounter (HOSPITAL_COMMUNITY): Payer: Self-pay | Admitting: Radiology

## 2012-06-11 DIAGNOSIS — N186 End stage renal disease: Secondary | ICD-10-CM

## 2012-06-11 LAB — RENAL FUNCTION PANEL
BUN: 62 mg/dL — ABNORMAL HIGH (ref 6–23)
CO2: 19 mEq/L (ref 19–32)
Chloride: 100 mEq/L (ref 96–112)
Creatinine, Ser: 6.1 mg/dL — ABNORMAL HIGH (ref 0.50–1.10)
Glucose, Bld: 124 mg/dL — ABNORMAL HIGH (ref 70–99)
Potassium: 3.1 mEq/L — ABNORMAL LOW (ref 3.5–5.1)

## 2012-06-11 LAB — CBC
HCT: 22.7 % — ABNORMAL LOW (ref 36.0–46.0)
Hemoglobin: 7.8 g/dL — ABNORMAL LOW (ref 12.0–15.0)
MCV: 84.4 fL (ref 78.0–100.0)
RBC: 2.69 MIL/uL — ABNORMAL LOW (ref 3.87–5.11)
WBC: 5.8 10*3/uL (ref 4.0–10.5)

## 2012-06-11 LAB — ANA: Anti Nuclear Antibody(ANA): POSITIVE — AB

## 2012-06-11 LAB — PROTIME-INR: INR: 1.1 (ref 0.00–1.49)

## 2012-06-11 LAB — FOLATE RBC: RBC Folate: 647 ng/mL — ABNORMAL HIGH (ref >=366)

## 2012-06-11 LAB — APTT: aPTT: 44 seconds — ABNORMAL HIGH (ref 24–37)

## 2012-06-11 LAB — GLUCOSE, CAPILLARY
Glucose-Capillary: 123 mg/dL — ABNORMAL HIGH (ref 70–99)
Glucose-Capillary: 216 mg/dL — ABNORMAL HIGH (ref 70–99)

## 2012-06-11 MED ORDER — POTASSIUM CHLORIDE 20 MEQ PO PACK: 20.0000 meq | PACK | Freq: Two times a day (BID) | ORAL | Status: DC

## 2012-06-11 MED ORDER — POTASSIUM CHLORIDE CRYS ER 20 MEQ PO TBCR
40.0000 meq | EXTENDED_RELEASE_TABLET | Freq: Once | ORAL | Status: AC
  Administered 2012-06-11: 40 meq via ORAL
  Filled 2012-06-11: qty 2

## 2012-06-11 MED ORDER — FUROSEMIDE 40 MG PO TABS
40.0000 mg | ORAL_TABLET | Freq: Two times a day (BID) | ORAL | Status: DC
  Administered 2012-06-11 – 2012-06-12 (×2): 40 mg via ORAL
  Filled 2012-06-11 (×4): qty 1

## 2012-06-11 MED ORDER — POTASSIUM CHLORIDE CRYS ER 20 MEQ PO TBCR
20.0000 meq | EXTENDED_RELEASE_TABLET | Freq: Two times a day (BID) | ORAL | Status: AC
  Administered 2012-06-11 (×2): 20 meq via ORAL
  Filled 2012-06-11 (×2): qty 1

## 2012-06-11 NOTE — Progress Notes (Signed)
I have seen and examined this patient and agree with the plan of care , fatigued but not dyspneic or uremic . Will replete potassium and reduce lasix  Dayton Kenley W 06/11/2012, 12:30 PM

## 2012-06-11 NOTE — Progress Notes (Signed)
PT Cancellation Note  Treatment cancelled today due to medical issues with patient which prohibited therapy. Pt currently with K 3.1 without repletion and unable to see at this time without repletion initiated. RN aware.  Delaney Meigs, PT [phone]

## 2012-06-11 NOTE — Progress Notes (Signed)
Physical Therapy Screen: chart reviewed and spoke with pt who is at baseline independent level and lives with spouse and son in 2 story house without DME. Pt able to ambulate >300' and flight of stairs independently. Pt without need for therapy at this time. Signing off. Thanks Delaney Meigs, PT [phone]

## 2012-06-11 NOTE — H&P (Signed)
[name] is an 36 y.o. female.   Chief Complaint: chronic kidney dz; renal transplant 2010 SOB; anemia even on Epogen Scheduled for bone marrow bx in an HPI: ESRD - hx HD - now with renal tx; Ovarian tumor - malignant; DM; HTN; TB; hyperlipidemia  Past Medical History  Diagnosis Date  . ESRD (end stage renal disease)     history of peritoneal and Hemodialysis, now s/p renal  transplant  . Renal transplant disorder 2010  . Ovarian tumor, malignant 2005    resected in [place]  . Diabetic retinopathy   . HTN (hypertension)     not well controlled  . 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 HLD (hyperlipidemia)   . CAP (community acquired pneumonia) 2010  . Diabetes mellitus     Past Surgical History  Procedure Date  . Tumor removal 2005    Ovarian, resected in [place]  . Av fistula placement   . Eye surgery     right eye   . Kidney transplant 2010    at [facility]  . Av fistula placement 06/03/2012    Procedure: INSERTION OF ARTERIOVENOUS (AV) GORE-TEX GRAFT ARM;  Surgeon: Larina Earthly, MD;  Location: [facility] OR;  Service: Vascular;  Laterality: Left;    Family History  Problem Relation Age of Onset  . Diabetes Mother   . Liver cancer Father   . Cancer Father    Social History:  reports that she has never smoked. She has never used smokeless tobacco. She reports that she does not drink alcohol or use illicit drugs.  Allergies:  Allergies  Allergen Reactions  . Ace Inhibitors Other (See Comments)     cause cough    Medications Prior to Admission  Medication Sig Dispense Refill  . amLODipine (NORVASC) 10 MG tablet Take 10 mg by mouth daily.      [name] aspirin EC 81 MG tablet Take 81 mg by mouth daily.      [name] escitalopram (LEXAPRO) 10 MG tablet Take 10 mg by mouth daily.      . furosemide (LASIX) 40 MG tablet Take 40 mg by mouth daily.      . insulin glargine (LANTUS) 100 UNIT/ML injection  Inject 60 Units into the skin at bedtime.      [name] labetalol (NORMODYNE) 100 MG tablet Take 100 mg by mouth 3 (three) times daily.      . metoCLOPramide (REGLAN) 5 MG tablet Take 5 mg by mouth 4 (four) times daily.      . mycophenolate (CELLCEPT) 250 MG capsule Take 750 mg by mouth 2 (two) times daily.       [name] omeprazole (PRILOSEC) 20 MG capsule Take 20 mg by mouth daily.      . predniSONE (DELTASONE) 5 MG tablet Take 20 mg by mouth daily.      . simvastatin (ZOCOR) 20 MG tablet Take 20 mg by mouth every Monday, Wednesday, and Friday.      . sodium bicarbonate 650 MG tablet Take 650 mg by mouth 2 (two) times daily.      [name] sulfamethoxazole-trimethoprim (BACTRIM DS) 800-160 MG per tablet Take 1 tablet by mouth every Monday, Wednesday, and Friday.      . tacrolimus (PROGRAF) 1 MG capsule Take 2-3 mg by mouth 2 (two) times daily. 3 capsules in the morning 2 capsules at night  Results for orders placed during the hospital encounter of 06/09/12 (from the past 48 hour(s))  PREPARE RBC (CROSSMATCH)     Status: Normal   Collection Time   06/09/12 10:25 AM      Component Value Range Comment   Order Confirmation ORDER PROCESSED BY BLOOD BANK     TYPE AND SCREEN     Status: Normal   Collection Time   06/09/12 10:25 AM      Component Value Range Comment   ABO/RH(D) AB POS      Antibody Screen NEG      Sample Expiration 06/12/2012      Unit Number [id number]      Blood Component Type RED CELLS,LR      Unit division 00      Status of Unit ISSUED,FINAL      Transfusion Status OK TO TRANSFUSE      Crossmatch Result Compatible      Unit Number [id number]      Blood Component Type RED CELLS,LR      Unit division 00      Status of Unit ISSUED,FINAL      Transfusion Status OK TO TRANSFUSE      Crossmatch Result Compatible     GLUCOSE, CAPILLARY     Status: Abnormal   Collection Time   06/09/12  2:21 PM      Component Value Range Comment   Glucose-Capillary 148 (*) 70 - 99 mg/dL   GLUCOSE,  CAPILLARY     Status: Abnormal   Collection Time   06/09/12  4:46 PM      Component Value Range Comment   Glucose-Capillary 161 (*) 70 - 99 mg/dL   CREATININE, SERUM     Status: Abnormal   Collection Time   06/09/12  8:14 PM      Component Value Range Comment   Creatinine, Ser 5.22 (*) 0.50 - 1.10 mg/dL    GFR calc non Af Amer 10 (*) >90 mL/min    GFR calc Af Amer 11 (*) >90 mL/min   HEMOGLOBIN A1C     Status: Abnormal   Collection Time   06/09/12  8:14 PM      Component Value Range Comment   Hemoglobin A1C 6.4 (*) <5.7 %    Mean Plasma Glucose 137 (*) <117 mg/dL   TROPONIN I     Status: Abnormal   Collection Time   06/09/12  8:14 PM      Component Value Range Comment   Troponin I 1.60 (*) <0.30 ng/mL   LACTATE DEHYDROGENASE     Status: Abnormal   Collection Time   06/09/12  8:14 PM      Component Value Range Comment   LDH 390 (*) 94 - 250 U/L   HAPTOGLOBIN     Status: Abnormal   Collection Time   06/09/12  8:14 PM      Component Value Range Comment   Haptoglobin 204 (*) 45 - 215 mg/dL   CBC WITH DIFFERENTIAL     Status: Abnormal   Collection Time   06/09/12  8:14 PM      Component Value Range Comment   WBC 8.1  4.0 - 10.5 K/uL    RBC 2.41 (*) 3.87 - 5.11 MIL/uL    Hemoglobin 7.2 (*) 12.0 - 15.0 g/dL    HCT 30.8 (*) 65.7 - 46.0 %    MCV 85.9  78.0 - 100.0 fL    MCH 29.9  26.0 - 34.0 pg  MCHC 34.8  30.0 - 36.0 g/dL    RDW 16.1  09.6 - 04.5 %    Platelets 237  150 - 400 K/uL    Neutrophils Relative 76  43 - 77 %    Lymphocytes Relative 14  12 - 46 %    Monocytes Relative 9  3 - 12 %    Eosinophils Relative 1  0 - 5 %    Basophils Relative 0  0 - 1 %    Neutro Abs 6.2  1.7 - 7.7 K/uL    Lymphs Abs 1.1  0.7 - 4.0 K/uL    Monocytes Absolute 0.7  0.1 - 1.0 K/uL    Eosinophils Absolute 0.1  0.0 - 0.7 K/uL    Basophils Absolute 0.0  0.0 - 0.1 K/uL    RBC Morphology POLYCHROMASIA PRESENT      WBC Morphology FEW HYPERSEGMENTED NEUTROPHILS NOTED     GLUCOSE, CAPILLARY     Status:  Abnormal   Collection Time   06/09/12  9:24 PM      Component Value Range Comment   Glucose-Capillary 181 (*) 70 - 99 mg/dL   TROPONIN I     Status: Abnormal   Collection Time   06/10/12  1:34 AM      Component Value Range Comment   Troponin I 1.89 (*) <0.30 ng/mL   CBC     Status: Abnormal   Collection Time   06/10/12  2:30 AM      Component Value Range Comment   WBC 7.6  4.0 - 10.5 K/uL    RBC 2.49 (*) 3.87 - 5.11 MIL/uL    Hemoglobin 7.5 (*) 12.0 - 15.0 g/dL    HCT 40.9 (*) 81.1 - 46.0 %    MCV 86.3  78.0 - 100.0 fL    MCH 30.1  26.0 - 34.0 pg    MCHC 34.9  30.0 - 36.0 g/dL    RDW 91.4  78.2 - 95.6 %    Platelets 258  150 - 400 K/uL   BASIC METABOLIC PANEL     Status: Abnormal   Collection Time   06/10/12  2:30 AM      Component Value Range Comment   Sodium 139  135 - 145 mEq/L DELTA CHECK NOTED   Potassium 3.4 (*) 3.5 - 5.1 mEq/L    Chloride 103  96 - 112 mEq/L    CO2 18 (*) 19 - 32 mEq/L    Glucose, Bld 108 (*) 70 - 99 mg/dL    BUN 49 (*) 6 - 23 mg/dL    Creatinine, Ser 2.13 (*) 0.50 - 1.10 mg/dL    Calcium 8.6  8.4 - 08.6 mg/dL    GFR calc non Af Amer 9 (*) >90 mL/min    GFR calc Af Amer 11 (*) >90 mL/min   RETICULOCYTES     Status: Abnormal   Collection Time   06/10/12  2:30 AM      Component Value Range Comment   Retic Ct Pct 4.4 (*) 0.4 - 3.1 %    RBC. 2.49 (*) 3.87 - 5.11 MIL/uL    Retic Count, Manual 109.6  19.0 - 186.0 K/uL   HAPTOGLOBIN     Status: Abnormal   Collection Time   06/10/12  2:30 AM      Component Value Range Comment   Haptoglobin 226 (*) 45 - 215 mg/dL   VITAMIN V78     Status: Normal   Collection Time  06/10/12  2:30 AM      Component Value Range Comment   Vitamin B-12 436  211 - 911 pg/mL   TSH     Status: Normal   Collection Time   06/10/12  2:30 AM      Component Value Range Comment   TSH 2.970  0.350 - 4.500 uIU/mL   GLUCOSE, CAPILLARY     Status: Abnormal   Collection Time   06/10/12  8:08 AM      Component Value Range Comment    Glucose-Capillary 67 (*) 70 - 99 mg/dL   GLUCOSE, CAPILLARY     Status: Normal   Collection Time   06/10/12  8:09 AM      Component Value Range Comment   Glucose-Capillary 70  70 - 99 mg/dL   GLUCOSE, CAPILLARY     Status: Abnormal   Collection Time   06/10/12 11:40 AM      Component Value Range Comment   Glucose-Capillary 136 (*) 70 - 99 mg/dL   GLUCOSE, CAPILLARY     Status: Abnormal   Collection Time   06/10/12  4:35 PM      Component Value Range Comment   Glucose-Capillary 465 (*) 70 - 99 mg/dL    Comment 1 Notify RN      Comment 2 Documented in Chart     GLUCOSE, RANDOM     Status: Abnormal   Collection Time   06/10/12  5:27 PM      Component Value Range Comment   Glucose, Bld 466 (*) 70 - 99 mg/dL   GLUCOSE, CAPILLARY     Status: Abnormal   Collection Time   06/10/12  9:10 PM      Component Value Range Comment   Glucose-Capillary 360 (*) 70 - 99 mg/dL   CBC     Status: Abnormal   Collection Time   06/11/12  6:55 AM      Component Value Range Comment   WBC 5.8  4.0 - 10.5 K/uL    RBC 2.69 (*) 3.87 - 5.11 MIL/uL    Hemoglobin 7.8 (*) 12.0 - 15.0 g/dL    HCT 16.1 (*) 09.6 - 46.0 %    MCV 84.4  78.0 - 100.0 fL    MCH 29.0  26.0 - 34.0 pg    MCHC 34.4  30.0 - 36.0 g/dL    RDW 04.5  40.9 - 81.1 %    Platelets 288  150 - 400 K/uL   RENAL FUNCTION PANEL     Status: Abnormal   Collection Time   06/11/12  6:55 AM      Component Value Range Comment   Sodium 138  135 - 145 mEq/L    Potassium 3.1 (*) 3.5 - 5.1 mEq/L    Chloride 100  96 - 112 mEq/L    CO2 19  19 - 32 mEq/L    Glucose, Bld 124 (*) 70 - 99 mg/dL    BUN 62 (*) 6 - 23 mg/dL    Creatinine, Ser 9.14 (*) 0.50 - 1.10 mg/dL    Calcium 8.7  8.4 - 78.2 mg/dL    Phosphorus 7.1 (*) 2.3 - 4.6 mg/dL    Albumin 3.2 (*) 3.5 - 5.2 g/dL    GFR calc non Af Amer 8 (*) >90 mL/min    GFR calc Af Amer 9 (*) >90 mL/min   GLUCOSE, CAPILLARY     Status: Abnormal   Collection Time   06/11/12  7:27 AM  Component Value Range Comment    Glucose-Capillary 123 (*) 70 - 99 mg/dL    Ct Abdomen Pelvis Wo Contrast  06/10/2012  *RADIOLOGY REPORT*  Clinical Data: Decreasing hemoglobin and  CT ABDOMEN AND PELVIS WITHOUT CONTRAST  Technique:  Multidetector CT imaging of the abdomen and pelvis was performed following the standard protocol without intravenous contrast.  Comparison: 07/21/2008  Findings: Small bilateral pleural effusions are evident.  There is trace pericardial thickening or fluid.  No focal abnormalities seen in the liver or spleen on this study performed without intravenous contrast material.  The stomach, duodenum, pancreas, gallbladder, adrenal glands, and abdominal bowel loops are unremarkable.  The native kidneys are atrophic.  Transplant kidney is identified in the right abdomen.  There is a trace amount of fluid around the transplant kidney.  No hydronephrosis in the transplant kidney.  No abdominal aortic aneurysm.  Small lymph nodes are seen in the retroperitoneal space, but no abdominal lymphadenopathy.  Imaging through the pelvis shows a tiny amount of free fluid.  The bladder is moderately distended.  Uterus is unremarkable.  There is no adnexal mass.  No pelvic sidewall lymphadenopathy.  No colonic diverticulitis.  Terminal ileum is normal. The appendix is not visualized, but there is no edema or inflammation in the region of the cecum.  Bone windows reveal no worrisome lytic or sclerotic osseous lesions.  IMPRESSION: Status post kidney transplant with a tiny amount of fluid around the transplant kidney.  No hydronephrosis of the transplant kidney.  No evidence for retroperitoneal hemorrhage.  Trace amount of free fluid identified in the anatomic pelvis.  Small bilateral pleural effusions.   Original Report Authenticated By: ERIC A. MANSELL, M.D.     Review of Systems  Constitutional: Negative for fever and chills.  Respiratory: Positive for shortness of breath.   Gastrointestinal: Negative for nausea and vomiting.    Neurological: Positive for weakness.    Blood pressure 153/81, pulse 83, temperature 97.9 F (36.6 C), temperature source Oral, resp. rate 20, height 5\' 7"  (1.702 m), weight 177 lb 7.5 oz (80.5 kg), last menstrual period 04/06/2012, SpO2 95.00%. Physical Exam  Constitutional: She is oriented to person, place, and time.  Cardiovascular: Normal rate, regular rhythm and normal heart sounds.   No murmur heard. Respiratory: Effort normal and breath sounds normal. She has no wheezes.  GI: Soft. There is no tenderness.  Musculoskeletal: Normal range of motion.  Neurological: She is alert and oriented to person, place, and time.  Psychiatric: She has a normal mood and affect. Her behavior is normal. Judgment and thought content normal.     Assessment/Plan Renal tx pt - 2010 ESRD Sob; anemia worsening on Epogen Scheduled for BM bx in am in IR Pt aware of procedure benefits and risks and agreeable to proceed Consent signed and in chart  Faithe Ariola A 06/11/2012, 9:39 AM

## 2012-06-11 NOTE — H&P (Signed)
Agree 

## 2012-06-11 NOTE — Progress Notes (Signed)
[facility] Progress Note [place] TEAM 1 - Stepdown/ICU TEAM   [name]:[id number] DOB: 1976/01/13 DOA: 06/09/2012 PCP: Juline Patch, MD  Brief narrative: 36 year old female who has a past medical history of renal transplant and is on immune suppressive therapy, per review of prior medical records she has been somewhat noncompliant with her meds and to followup, worsening anemia inspite of Epogen therapy and requiring numerous blood transfusions recently presents to the hospital with SOB/fatigue. The patient's last blood transfusion was around 2 weeks ago, she has had worsening of her chronic kidney failure recently and has had a AV fistula placed recently, claims that for the past 2-3 days she has been feeling very fatigued and tired. In the emergency room, patient underwent a chest x-ray which showed mild interstitial edema, her CBC showed a hemoglobin of 4.6, and I was subsequently asked to admit this patient for further evaluation and treatment. During my evaluation, patient was comfortable, her O2 saturations were in the mid 90s on 3 L of oxygen via nasal cannula.   Assessment/Plan:  Acute/chronic renal failure status post renal transplant 2010 >> ESRD Status post placement of AV fistula 06/03/2012 - nephrology following- no plans for inpatient dialysis  Dyspnea / pulmonary edema Likely due to very low oncotic pressure in setting of severe anemia with renal failure - much improved symptomatically and on exam status post transfusion  Severe anemia - etiology unclear per Dr Hyman Hopes patient has had no appropriate response to Epogen and IV Iron infusion which he has commenced in the past - on average she has required blood q 2weeks - see comments per hematology - hemoglobin improved to 7.5 from nadir of 4.6 status post 2 units packed red blood cells - haptoglobin not consistent with hemolysis -  B12 and folic acid levels not low - guaiac stool - noncontrasted CT of abdomen negative for  retroperitoneal hemorrhage Bone marrow biopsy planned for tomorrow  Elevated troponin Cardiology has evaluated - no further cardiac workup indicated  Hypertension Reasonably controlled at the present time - continue to follow without change in treatment today  History of pulmonary tuberculosis status post treatment 2007 No symptoms to suggest acute issues  Hyperlipidemia Continue Zocor therapy  Diabetes mellitus CBG stable- was having occasional mild hypoglycemic episodes which have resolved - continue to follow trend - A1c is noted to be 6.4  Code Status: Full Disposition Plan: home in 1-2 days  Consultants: Cardiology Hematology Nephrology  Procedures: Echocardiogram 06/09/2012 - normal EF - mild pulmonary hypertension  Antibiotics: None  DVT prophylaxis: SCDs only until clear that pt is not suffering w/ GIB  HPI/Subjective: Pt is sitting in bed - she has no complaints- has been explained what anemia is and why she will be having bone marrow biopsy tomorrow- all questions answered.    Objective: Blood pressure 133/78, pulse 90, temperature 97.2 F (36.2 C), temperature source Oral, resp. rate 20, height 5\' 7"  (1.702 m), weight 80.5 kg (177 lb 7.5 oz), last menstrual period 04/06/2012, SpO2 96.00%.  Intake/Output Summary (Last 24 hours) at 06/11/12 1849 Last data filed at 06/11/12 1700  Gross per 24 hour  Intake    480 ml  Output    525 ml  Net    -45 ml     Exam: General: No acute respiratory distress Lungs: Clear to auscultation bilaterally without wheezes or crackles Cardiovascular: Regular rate and rhythm without murmur gallop or rub normal S1 and S2 Abdomen: Nontender, nondistended, soft, bowel sounds positive, no  rebound, no ascites, no appreciable mass Extremities: No significant cyanosis, clubbing, or edema bilateral lower extremities  Data Reviewed: Basic Metabolic Panel:  Lab 06/11/12 9604 06/10/12 1727 06/10/12 0230 06/09/12 2014 06/09/12 0922   NA 138 -- 139 -- 132*  K 3.1* -- 3.4* -- 3.6  CL 100 -- 103 -- 100  CO2 19 -- 18* -- 16*  GLUCOSE 124* 466* 108* -- 167*  BUN 62* -- 49* -- 49*  CREATININE 6.10* -- 5.37* 5.22* 5.17*  CALCIUM 8.7 -- 8.6 -- 8.4  MG -- -- -- -- --  PHOS 7.1* -- -- -- --   Liver Function Tests:  Lab 06/11/12 0655 06/09/12 0922  AST -- 15  ALT -- 10  ALKPHOS -- 72  BILITOT -- 0.2*  PROT -- 5.9*  ALBUMIN 3.2* 3.0*   CBC:  Lab 06/11/12 0655 06/10/12 0230 06/09/12 2014 06/09/12 0922  WBC 5.8 7.6 8.1 9.1  NEUTROABS -- -- 6.2 7.4  HGB 7.8* 7.5* 7.2* 4.6*  HCT 22.7* 21.5* 20.7* 14.0*  MCV 84.4 86.3 85.9 85.4  PLT 288 258 237 247   Cardiac Enzymes:  Lab 06/10/12 0134 06/09/12 2014 06/09/12 0923  CKTOTAL -- -- --  CKMB -- -- --  CKMBINDEX -- -- --  TROPONINI 1.89* 1.60* 1.09*   CBG:  Lab 06/11/12 1601 06/11/12 1200 06/11/12 0727 06/10/12 2110 06/10/12 1635  GLUCAP 216* 217* 123* 360* 465*    Recent Results (from the past 240 hour(s))  SURGICAL PCR SCREEN     Status: Normal   Collection Time   06/02/12  8:44 AM      Component Value Range Status Comment   MRSA, PCR NEGATIVE  NEGATIVE Final    Staphylococcus aureus NEGATIVE  NEGATIVE Final      Studies:  Recent x-ray studies have been reviewed in detail by the Attending Physician  Scheduled Meds:  Reviewed in detail by the Attending Physician   [place] Cantor, MD [phone]  If 7PM-7AM, please contact night-coverage [email or web address] Password [email or web address] 06/11/2012, 6:49 PM   LOS: 2 days

## 2012-06-11 NOTE — Progress Notes (Signed)
PA Student Daily Progress Note [facility] Subjective:  Ms. Lacap reports feeling better this morning.  She is still somewhat fatigued but is not having any difficulty breathing and has more energy today.  She denies any pain or discomfort.  She reports good urine output yesterday and last night.  She denies having any bloody stools.  She denies any new complaints today.    Objective:    Filed Vitals:   06/10/12 1800 06/10/12 2108 06/11/12 0515 06/11/12 0830  BP: 125/63 135/75 148/102 153/81  Pulse: 72 91 82 83  Temp: 98.9 F (37.2 C) 98.5 F (36.9 C) 98.5 F (36.9 C) 97.9 F (36.6 C)  TempSrc: Oral Oral Oral Oral  Resp: 20 20 20 20   Height:      Weight:  80.5 kg (177 lb 7.5 oz)    SpO2: 96% 98% 93% 95%   Physical Exam: General appearance : Pt is somewhat lethargic, laying in bed. She is in no acute distess  Neck: no JVD noted.  Chest: breathing unlabored, no use of accessory muscles.  No crackles or rhonchi to auscualtation. CVS: Regular rate and rhythm  Abdomen: no abdominal distention, tenderness or rigidity.  Extremities: No lower extremity edema noted. AV graft to left arm with palpable pulse.  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  Pt taking 80 mg IV lasix TID, pt reports continued urine output. Mild increase in Creatinine today at 6.1 but fairly stable.  New AV graft in place in preparation for future dialysis but no need for dialysis at this time.  BP somewhat elevated today 153/81. 2. Anemia- Hgb increased to 7.8 today.  Pt has a 6 month hx of persistent anemia despite weekly aranesp.  Unsure of etiology. Hematology has a bone marrow biopsy ordered today.  Abdominal CT showed no abdominal blood loss.  Fecal occult blood in process.  3. Hypokalemia- potassium trending down to 3.1 today.  Recommend starting 20 meq Potassium supplement today. 4. Elevated Troponin- due to  demand ischemia from severe anemia  5. Pulmonary Edema- Chest x-ray showed cardiomegaly and mild interstitial edema. Abdominal CT showed small bilateral pleural effusions.  Lungs sound better today.  Diuresis continued.    Labs: Basic Metabolic Panel:  Lab 06/11/12 2130 06/10/12 1727 06/10/12 0230 06/09/12 2014 06/09/12 0922  NA 138 -- 139 -- 132*  K 3.1* -- 3.4* -- 3.6  CL 100 -- 103 -- 100  CO2 19 -- 18* -- 16*  GLUCOSE 124* 466* 108* -- --  BUN 62* -- 49* -- 49*  CREATININE 6.10* -- 5.37* 5.22* --  CALCIUM 8.7 -- 8.6 -- 8.4  ALB -- -- -- -- --  PHOS 7.1* -- -- -- --   Liver Function Tests:  Lab 06/11/12 0655 06/09/12 0922  AST -- 15  ALT -- 10  ALKPHOS -- 72  BILITOT -- 0.2*  PROT -- 5.9*  ALBUMIN 3.2* 3.0*   CBC:  Lab 06/11/12 0655 06/10/12 0230 06/09/12 2014 06/09/12 0922  WBC 5.8 7.6 8.1 --  NEUTROABS -- -- 6.2 7.4  HGB 7.8* 7.5* 7.2* --  HCT 22.7* 21.5* 20.7* --  MCV 84.4 86.3 85.9 85.4  PLT 288 258 237 --   Blood Culture    Component Value Date/Time   SDES BLOOD HEMODIALYSIS CATHETER ARTERIAL DRAW 12/18/2008 0745   SPECREQUEST BOTTLES DRAWN AEROBIC AND ANAEROBIC 10CC 12/18/2008 0745   CULT NO GROWTH 5 DAYS  12/18/2008 0745   REPTSTATUS 12/24/2008 FINAL 12/18/2008 0745    Cardiac Enzymes:  Lab 06/10/12 0134 06/09/12 2014 06/09/12 0923  CKTOTAL -- -- --  CKMB -- -- --  CKMBINDEX -- -- --  TROPONINI 1.89* 1.60* 1.09*   CBG:  Lab 06/11/12 0727 06/10/12 2110 06/10/12 1635 06/10/12 1140 06/10/12 0809  GLUCAP 123* 360* 465* 136* 70   Micro Results: Recent Results (from the past 240 hour(s))  SURGICAL PCR SCREEN     Status: Normal   Collection Time   06/02/12  8:44 AM      Component Value Range Status Comment   MRSA, PCR NEGATIVE  NEGATIVE Final    Staphylococcus aureus NEGATIVE  NEGATIVE Final    Studies/Results: Ct Abdomen Pelvis Wo Contrast  06/10/2012  *RADIOLOGY REPORT*  Clinical Data: Decreasing hemoglobin and  CT ABDOMEN AND PELVIS WITHOUT  CONTRAST  Technique:  Multidetector CT imaging of the abdomen and pelvis was performed following the standard protocol without intravenous contrast.  Comparison: 07/21/2008  Findings: Small bilateral pleural effusions are evident.  There is trace pericardial thickening or fluid.  No focal abnormalities seen in the liver or spleen on this study performed without intravenous contrast material.  The stomach, duodenum, pancreas, gallbladder, adrenal glands, and abdominal bowel loops are unremarkable.  The native kidneys are atrophic.  Transplant kidney is identified in the right abdomen.  There is a trace amount of fluid around the transplant kidney.  No hydronephrosis in the transplant kidney.  No abdominal aortic aneurysm.  Small lymph nodes are seen in the retroperitoneal space, but no abdominal lymphadenopathy.  Imaging through the pelvis shows a tiny amount of free fluid.  The bladder is moderately distended.  Uterus is unremarkable.  There is no adnexal mass.  No pelvic sidewall lymphadenopathy.  No colonic diverticulitis.  Terminal ileum is normal. The appendix is not visualized, but there is no edema or inflammation in the region of the cecum.  Bone windows reveal no worrisome lytic or sclerotic osseous lesions.  IMPRESSION: Status post kidney transplant with a tiny amount of fluid around the transplant kidney.  No hydronephrosis of the transplant kidney.  No evidence for retroperitoneal hemorrhage.  Trace amount of free fluid identified in the anatomic pelvis.  Small bilateral pleural effusions.   Original Report Authenticated By: ERIC A. MANSELL, M.D.    Medications: Scheduled Meds:   . amLODipine  10 mg Oral Daily  . aspirin EC  81 mg Oral Daily  . darbepoetin (ARANESP) injection - NON-DIALYSIS  150 mcg Subcutaneous Q Tue-1800  . escitalopram  10 mg Oral Daily  . furosemide  80 mg Intravenous TID  . insulin aspart  0-20 Units Subcutaneous TID WC  . insulin aspart  14 Units Subcutaneous Once  .  insulin aspart  3 Units Subcutaneous TID WC  . insulin glargine  60 Units Subcutaneous QHS  . labetalol  100 mg Oral TID  . metoCLOPramide  5 mg Oral QID  . mycophenolate  750 mg Oral BID  . pantoprazole  40 mg Oral Q1200  . predniSONE  20 mg Oral Q breakfast  . simvastatin  20 mg Oral Q M,W,F-1800  . sodium bicarbonate  650 mg Oral BID  . sulfamethoxazole-trimethoprim  1 tablet Oral Q M,W,F  . tacrolimus  2 mg Oral QHS  . tacrolimus  3 mg Oral Daily  . DISCONTD: enoxaparin (LOVENOX) injection  40 mg Subcutaneous Q24H  . DISCONTD: insulin aspart  0-9 Units Subcutaneous TID WC  .  DISCONTD: insulin glargine  50 Units Subcutaneous QHS  . DISCONTD: insulin glargine  60 Units Subcutaneous QHS  . DISCONTD: sodium chloride  3 mL Intravenous Q12H  . DISCONTD: sodium chloride  3 mL Intravenous Q12H   Continuous Infusions:  PRN Meds:.acetaminophen, acetaminophen, albuterol, ondansetron (ZOFRAN) IV, ondansetron, DISCONTD: sodium chloride, DISCONTD: sodium chloride   This is a Psychologist, occupational Note.  The care of the patient was discussed with Dr. Hyman Hopes and the assessment and plan formulated with their assistance.  Please see their attached note for official documentation of the daily encounter.  Delmer Islam PA-S2 06/11/2012, 10:04 AM

## 2012-06-12 ENCOUNTER — Inpatient Hospital Stay (HOSPITAL_COMMUNITY): Payer: Medicare Other

## 2012-06-12 DIAGNOSIS — Z940 Kidney transplant status: Secondary | ICD-10-CM

## 2012-06-12 LAB — PROTEIN ELECTROPHORESIS, SERUM
Albumin ELP: 58.9 % (ref 55.8–66.1)
Alpha-1-Globulin: 8.3 % — ABNORMAL HIGH (ref 2.9–4.9)
Alpha-2-Globulin: 15.2 % — ABNORMAL HIGH (ref 7.1–11.8)
Gamma Globulin: 7.8 % — ABNORMAL LOW (ref 11.1–18.8)
Total Protein ELP: 6.1 g/dL (ref 6.0–8.3)

## 2012-06-12 LAB — CBC
HCT: 23.1 % — ABNORMAL LOW (ref 36.0–46.0)
Hemoglobin: 8 g/dL — ABNORMAL LOW (ref 12.0–15.0)
MCH: 29.5 pg (ref 26.0–34.0)
MCHC: 34.6 g/dL (ref 30.0–36.0)
MCV: 85.2 fL (ref 78.0–100.0)
RDW: 14.8 % (ref 11.5–15.5)

## 2012-06-12 LAB — BASIC METABOLIC PANEL
BUN: 74 mg/dL — ABNORMAL HIGH (ref 6–23)
BUN: 72 mg/dL — ABNORMAL HIGH (ref 6–23)
CO2: 17 mEq/L — ABNORMAL LOW (ref 19–32)
Calcium: 8.6 mg/dL (ref 8.4–10.5)
Chloride: 102 mEq/L (ref 96–112)
Creatinine, Ser: 6.33 mg/dL — ABNORMAL HIGH (ref 0.50–1.10)
Creatinine, Ser: 5.95 mg/dL — ABNORMAL HIGH (ref 0.50–1.10)
GFR calc Af Amer: 9 mL/min — ABNORMAL LOW (ref >90)
GFR calc Af Amer: 10 mL/min — ABNORMAL LOW (ref >90)
GFR calc non Af Amer: 8 mL/min — ABNORMAL LOW (ref >90)
GFR calc non Af Amer: 8 mL/min — ABNORMAL LOW (ref >90)
Glucose, Bld: 127 mg/dL — ABNORMAL HIGH (ref 70–99)
Glucose, Bld: 120 mg/dL — ABNORMAL HIGH (ref 70–99)
Potassium: 3.6 mEq/L (ref 3.5–5.1)
Potassium: 3.5 mEq/L (ref 3.5–5.1)
Sodium: 139 mEq/L (ref 135–145)

## 2012-06-12 LAB — HEMOSIDERIN, URINE

## 2012-06-12 MED ORDER — MIDAZOLAM HCL 2 MG/2ML IJ SOLN
INTRAMUSCULAR | Status: AC
  Filled 2012-06-12: qty 4

## 2012-06-12 MED ORDER — MIDAZOLAM HCL 5 MG/5ML IJ SOLN
INTRAMUSCULAR | Status: AC | PRN
  Administered 2012-06-12 (×2): 1 mg via INTRAVENOUS

## 2012-06-12 MED ORDER — FENTANYL CITRATE 0.05 MG/ML IJ SOLN
INTRAMUSCULAR | Status: AC | PRN
  Administered 2012-06-12: 50 ug via INTRAVENOUS
  Administered 2012-06-12: 25 ug via INTRAVENOUS

## 2012-06-12 MED ORDER — FUROSEMIDE 40 MG PO TABS: 40.0000 mg | ORAL_TABLET | Freq: Two times a day (BID) | ORAL | Status: DC

## 2012-06-12 MED ORDER — DARBEPOETIN ALFA-POLYSORBATE 150 MCG/0.3ML IJ SOLN: 150.0000 ug | INTRAMUSCULAR | Status: DC

## 2012-06-12 MED ORDER — ZOLPIDEM TARTRATE 5 MG PO TABS
5.0000 mg | ORAL_TABLET | Freq: Once | ORAL | Status: AC
  Administered 2012-06-12: 5 mg via ORAL
  Filled 2012-06-12: qty 1

## 2012-06-12 MED ORDER — HYDROCODONE-ACETAMINOPHEN 5-325 MG PO TABS: 1.0000 | ORAL_TABLET | ORAL | Status: DC | PRN

## 2012-06-12 MED ORDER — FENTANYL CITRATE 0.05 MG/ML IJ SOLN
INTRAMUSCULAR | Status: AC
  Filled 2012-06-12: qty 4

## 2012-06-12 NOTE — Progress Notes (Signed)
Inpatient Diabetes Program Recommendations  AACE/ADA: New Consensus Statement on Inpatient Glycemic Control (2013)  Target Ranges:  Prepandial:   less than 140 mg/dL      Peak postprandial:   less than 180 mg/dL (1-2 hours)      Critically ill patients:  140 - 180 mg/dL   Reason for Visit: Results for [name], [name] (MRN [id number]) as of 06/12/2012 12:40  Ref. Range 06/12/2012 07:43 06/12/2012 11:25  Glucose-Capillary Latest Range: 70-99 mg/dL 308 (H) 657 (H)   CBG's improved today. Consider reducing correction scale to sensitive tid with meals.

## 2012-06-12 NOTE — Progress Notes (Signed)
Subjective: Feeling better. Wondering if she can be discharged today.  Objective: Vital signs in last 24 hours: Filed Vitals:   06/12/12 0830 06/12/12 0836 06/12/12 0840 06/12/12 0944  BP: 159/89 165/92 158/85 132/78  Pulse: 81 80 84 79  Temp:    98.4 F (36.9 C)  TempSrc:    Oral  Resp: 16 15 20 20   Height:      Weight:      SpO2: 100% 100% 100% 100%   Weight change: 2.599 kg (5 lb 11.7 oz)  Intake/Output Summary (Last 24 hours) at 06/12/12 1346 Last data filed at 06/12/12 0900  Gross per 24 hour  Intake    720 ml  Output    875 ml  Net   -155 ml    Physical Exam: General: Awake, Oriented, No acute distress. HEENT: EOMI. Neck: Supple CV: S1 and S2 Lungs: Clear to ascultation bilaterally Abdomen: Soft, Nontender, Nondistended, +bowel sounds. Ext: Good pulses. Trace edema.  Lab Results: Basic Metabolic Panel:  Lab 06/12/12 4540 06/12/12 0700 06/11/12 0655 06/10/12 1727 06/10/12 0230 06/09/12 2014 06/09/12 0922  NA 139 138 138 -- 139 -- 132*  K 3.5 3.6 3.1* -- 3.4* -- 3.6  CL 102 101 100 -- 103 -- 100  CO2 17* 18* 19 -- 18* -- 16*  GLUCOSE 120* 127* 124* 466* 108* -- --  BUN 72* 74* 62* -- 49* -- 49*  CREATININE 5.95* 6.33* 6.10* -- 5.37* 5.22* --  CALCIUM 8.6 8.7 8.7 -- 8.6 -- 8.4  MG -- -- -- -- -- -- --  PHOS -- -- 7.1* -- -- -- --   Liver Function Tests:  Lab 06/11/12 0655 06/09/12 0922  AST -- 15  ALT -- 10  ALKPHOS -- 72  BILITOT -- 0.2*  PROT -- 5.9*  ALBUMIN 3.2* 3.0*   No results found for this basename: LIPASE:5,AMYLASE:5 in the last 168 hours No results found for this basename: AMMONIA:5 in the last 168 hours CBC:  Lab 06/12/12 0700 06/11/12 0655 06/10/12 0230 06/09/12 2014 06/09/12 0922  WBC 5.8 5.8 7.6 8.1 9.1  NEUTROABS -- -- -- 6.2 7.4  HGB 8.0* 7.8* 7.5* 7.2* 4.6*  HCT 23.1* 22.7* 21.5* 20.7* 14.0*  MCV 85.2 84.4 86.3 85.9 85.4  PLT 311 288 258 237 247   Cardiac Enzymes:  Lab 06/10/12 0134 06/09/12 2014 06/09/12 0923  CKTOTAL  -- -- --  CKMB -- -- --  CKMBINDEX -- -- --  TROPONINI 1.89* 1.60* 1.09*   BNP (last 3 results) No results found for this basename: PROBNP:3 in the last 8760 hours CBG:  Lab 06/12/12 1125 06/12/12 0743 06/11/12 2204 06/11/12 1601 06/11/12 1200  GLUCAP 143* 108* 291* 216* 217*    Basename 06/09/12 2014  HGBA1C 6.4*   Other Labs: No components found with this basename: POCBNP:3  Lab 06/09/12 0922  DDIMER 0.66*   No results found for this basename: CHOL:2,HDL:2,LDLCALC:2,TRIG:2,CHOLHDL:2,LDLDIRECT:2 in the last 168 hours  Lab 06/10/12 0230  TSH 2.970  T4TOTAL --  T3FREE --  FREET4 --  THYROIDAB --    Lab 06/10/12 0230  VITAMINB12 436  FOLATE --  FERRITIN --  TIBC --  IRON --  RETICCTPCT 4.4*    Micro Results: No results found for this or any previous visit (from the past 240 hour(s)).  Studies/Results: Ct Abdomen Pelvis Wo Contrast  06/10/2012  *RADIOLOGY REPORT*  Clinical Data: Decreasing hemoglobin and  CT ABDOMEN AND PELVIS WITHOUT CONTRAST  Technique:  Multidetector CT imaging of  the abdomen and pelvis was performed following the standard protocol without intravenous contrast.  Comparison: 07/21/2008  Findings: Small bilateral pleural effusions are evident.  There is trace pericardial thickening or fluid.  No focal abnormalities seen in the liver or spleen on this study performed without intravenous contrast material.  The stomach, duodenum, pancreas, gallbladder, adrenal glands, and abdominal bowel loops are unremarkable.  The native kidneys are atrophic.  Transplant kidney is identified in the right abdomen.  There is a trace amount of fluid around the transplant kidney.  No hydronephrosis in the transplant kidney.  No abdominal aortic aneurysm.  Small lymph nodes are seen in the retroperitoneal space, but no abdominal lymphadenopathy.  Imaging through the pelvis shows a tiny amount of free fluid.  The bladder is moderately distended.  Uterus is unremarkable.  There is  no adnexal mass.  No pelvic sidewall lymphadenopathy.  No colonic diverticulitis.  Terminal ileum is normal. The appendix is not visualized, but there is no edema or inflammation in the region of the cecum.  Bone windows reveal no worrisome lytic or sclerotic osseous lesions.  IMPRESSION: Status post kidney transplant with a tiny amount of fluid around the transplant kidney.  No hydronephrosis of the transplant kidney.  No evidence for retroperitoneal hemorrhage.  Trace amount of free fluid identified in the anatomic pelvis.  Small bilateral pleural effusions.   Original Report Authenticated By: ERIC A. MANSELL, M.D.     Medications: I have reviewed the patient's current medications. Scheduled Meds:   . amLODipine  10 mg Oral Daily  . aspirin EC  81 mg Oral Daily  . darbepoetin (ARANESP) injection - NON-DIALYSIS  150 mcg Subcutaneous Q Tue-1800  . escitalopram  10 mg Oral Daily  . fentaNYL      . furosemide  40 mg Oral BID  . insulin aspart  0-20 Units Subcutaneous TID WC  . insulin aspart  3 Units Subcutaneous TID WC  . insulin glargine  60 Units Subcutaneous QHS  . labetalol  100 mg Oral TID  . metoCLOPramide  5 mg Oral QID  . midazolam      . mycophenolate  750 mg Oral BID  . pantoprazole  40 mg Oral Q1200  . potassium chloride  20 mEq Oral BID  . predniSONE  20 mg Oral Q breakfast  . simvastatin  20 mg Oral Q M,W,F-1800  . sodium bicarbonate  650 mg Oral BID  . sulfamethoxazole-trimethoprim  1 tablet Oral Q M,W,F  . tacrolimus  2 mg Oral QHS  . tacrolimus  3 mg Oral Daily  . zolpidem  5 mg Oral Once   Continuous Infusions:  PRN Meds:.acetaminophen, acetaminophen, albuterol, fentaNYL, HYDROcodone-acetaminophen, midazolam, ondansetron (ZOFRAN) IV, ondansetron  Assessment/Plan: Acute/chronic renal failure status post renal transplant 2010 >> ESRD  Status post placement of AV fistula 06/03/2012 - nephrology following- no plans for inpatient dialysis   Dyspnea / pulmonary edema    Likely due to very low oncotic pressure in setting of severe anemia with renal failure - much improved symptomatically and on exam after transfusion.  Severe anemia - etiology unclear  Per Dr Hyman Hopes patient has had no appropriate response to Epogen and IV Iron infusion which he has commenced in the past. On average she has required blood q 2weeks, see comments per hematology. Hemoglobin improved to 7.5 from nadir of 4.6 status post 2 units packed red blood cells.  Haptoglobin not consistent with hemolysis.  B12 and folic acid levels not low.  TSH normal.  Guaiac stool, not done. Noncontrasted CT of abdomen negative for retroperitoneal hemorrhage. Bone marrow biopsy done today, results pending.   Elevated troponin  Cardiology has evaluated - no further cardiac workup indicated   Hypertension  Reasonably controlled at the present time - continue to follow without change in treatment today   History of pulmonary tuberculosis status post treatment 2007  No symptoms to suggest acute issues   Hyperlipidemia  Continue Zocor therapy.  Diabetes mellitus  CBG stable, was having occasional mild hypoglycemic episodes which have resolved.  Hemoglobin A1c 6.4 on 06/09/2012, continue to encourage diet and exercise.   Code Status: Full  Disposition Plan: Home today.  Consultants:  Cardiology  Hematology  Nephrology  Procedures:  Echocardiogram 06/09/2012 - normal EF - mild pulmonary hypertension  Antibiotics:  None  DVT prophylaxis:  SCDs only until clear that pt is not suffering w/ GIB    LOS: 3 days  Charlestine Rookstool A, MD 06/12/2012, 1:46 PM

## 2012-06-12 NOTE — Procedures (Signed)
Interventional Radiology Procedure Note  Procedure: CT guided right iliac bone marrow bx Complications: None Recommendations:  - Bedrest x 2 hrs, then advance to ambulation - D/C in 3 hrs if no complications  Signed,  Sterling Big, MD Vascular & Interventional Radiologist [facility]

## 2012-06-12 NOTE — Progress Notes (Signed)
I have seen and examined this patient and agree with the plan of care , agree continue ESA and hematology follow up of Bone marrow biopsy  Charlene Cowdrey W 06/12/2012, 12:55 PM

## 2012-06-12 NOTE — Progress Notes (Signed)
AVS reviewed with pt. Teach back method used. Pt able to verbalize understanding of AVS with no questions. IV and tele DC'd. Ambulatory pt got on elevator to exit facility. [place], Rosanna Randy

## 2012-06-12 NOTE — Discharge Summary (Addendum)
Physician Discharge Summary  [name] ZOX:[id number] DOB: [birth year] DOA: 06/09/2012  PCP: Juline Patch, MD  Admit date: 06/09/2012 Discharge date: 06/12/2012  Recommendations for Outpatient Follow-up:  1. Dr. Donnie Coffin to followup on bone marrow biopsy results.  2. Followup with Juline Patch, MD (PCP) in 1 week, please discuss with Dr. Ricki Miller about stool cards and if positive to evaluate for possible GI bleed. 3. Followup with [facility] for the aranesp inject next week, to have CBC and BMET checked at that visit. 4. Followup with Dr. Donnie Coffin in 1 week. Please call to make the appointment.  Discharge Diagnoses:  Principal Problem:  *Acute on chronic renal failure Active Problems:  HYPERLIPIDEMIA  HYPERTENSION  Shortness of breath  Anemia  DM (diabetes mellitus)  Fatigue  Chest pain  Pulmonary edema  Elevated troponin level   Discharge Condition: Stable  Diet recommendation: Diabetic diet  Filed Weights   06/10/12 0449 06/10/12 2108 06/11/12 2204  Weight: 80.6 kg (177 lb 11.1 oz) 80.5 kg (177 lb 7.5 oz) 83.099 kg (183 lb 3.2 oz)    History of present illness:  Patient is a 36 year old [name] who has a past medical history of renal transplant and is on immune suppressive therapy, per review of prior medical records she has been somewhat noncompliant to her meds and to followup, worsening anemia inspite of Epogen therapy and requiring numerous blood transfusions recently presented on 06/09/2012 with shortness of breath and fatigue.  Hospital Course:  Acute/chronic renal failure status post renal transplant 2010 >> ESRD  Status post placement of AV fistula 06/03/2012 - nephrology following- no plans for inpatient dialysis   Dyspnea / pulmonary edema  Likely due to very low oncotic pressure in setting of severe anemia with renal failure - much improved symptomatically and on exam after transfusion.  Severe anemia - etiology unclear  Per Dr Hyman Hopes patient has had no appropriate  response to Epogen and IV Iron infusion which he has commenced in the past. On average she has required blood q 2 weeks, see comments per hematology. Hemoglobin improved to 7.5 from nadir of 4.6 status post 2 units packed red blood cells.  Haptoglobin not consistent with hemolysis.  B12 and folic acid levels not low.  TSH normal. Patient has not had stool, rectal exam was performed on 06/12/2012 and guiac was negative. Recommend the patient followup with PCP for stool cards and if positive for blood to have outpatient GI evaluation. Noncontrasted CT of abdomen negative for retroperitoneal hemorrhage. Bone marrow biopsy done today, results pending.   Elevated troponin  Cardiology has evaluated - no further cardiac workup indicated.  Hypertension  Reasonably controlled at the present time - continue to follow without change in treatment today.  History of pulmonary tuberculosis status post treatment 2007  No symptoms to suggest acute issues   Hyperlipidemia  Continue Zocor therapy.  Diabetes mellitus  CBG stable, was having occasional mild hypoglycemic episodes which have resolved.  Hemoglobin A1c 6.4 on 06/09/2012, Continue diabetic diet.  Encourage diet and exercise.   Consultants:  Cardiology  Hematology  Nephrology   Procedures:  Echocardiogram 06/09/2012 - normal EF - mild pulmonary hypertension   Discharge Exam: Filed Vitals:   06/12/12 0944  BP: 132/78  Pulse: 79  Temp: 98.4 F (36.9 C)  Resp: 20   Filed Vitals:   06/12/12 0830 06/12/12 0836 06/12/12 0840 06/12/12 0944  BP: 159/89 165/92 158/85 132/78  Pulse: 81 80 84 79  Temp:    98.4 F (36.9 C)  TempSrc:    Oral  Resp: 16 15 20 20   Height:      Weight:      SpO2: 100% 100% 100% 100%    Discharge Instructions  Discharge Orders    Future Appointments: Provider: Department: Dept Phone: Center:   06/16/2012 11:30 AM Larina Earthly, MD [facility] [phone] [facility]   06/23/2012 9:00 AM [facility]  [phone] [place]     Future Orders Please Complete By Expires   CT Biopsy  06/11/12 06/10/13   Questions: Responses:   Is the patient pregnant? No   Preferred imaging location? [facility]   Reason for exam: bone marrow   Diet Carb Modified      Increase activity slowly      Discharge instructions      Comments:   Followup with Juline Patch, MD (PCP) in 1 week, please discuss with Dr. Ricki Miller about stool cards and if positive to evaluate for possible GI bleed. Followup with [facility] for the aranesp inject next week, to have CBC and BMET checked at that visit. Followup with Dr. Donnie Coffin in 1 week. Please call to make the appointment.     Medication List  As of 06/12/2012  1:57 PM   TAKE these medications         amLODipine 10 MG tablet   Commonly known as: NORVASC   Take 10 mg by mouth daily.      aspirin EC 81 MG tablet   Take 81 mg by mouth daily.      darbepoetin 150 MCG/0.3ML Soln   Commonly known as: ARANESP   Inject 0.3 mLs (150 mcg total) into the skin every Tuesday at 6 PM.      escitalopram 10 MG tablet   Commonly known as: LEXAPRO   Take 10 mg by mouth daily.      furosemide 40 MG tablet   Commonly known as: LASIX   Take 1 tablet (40 mg total) by mouth 2 (two) times daily.      insulin glargine 100 UNIT/ML injection   Commonly known as: LANTUS   Inject 60 Units into the skin at bedtime.      labetalol 100 MG tablet   Commonly known as: NORMODYNE   Take 100 mg by mouth 3 (three) times daily.      metoCLOPramide 5 MG tablet   Commonly known as: REGLAN   Take 5 mg by mouth 4 (four) times daily.      mycophenolate 250 MG capsule   Commonly known as: CELLCEPT   Take 750 mg by mouth 2 (two) times daily.      omeprazole 20 MG capsule   Commonly known as: PRILOSEC   Take 20 mg by mouth daily.      predniSONE 5 MG tablet   Commonly known as: DELTASONE   Take 20 mg by mouth daily.      simvastatin 20 MG tablet   Commonly known as: ZOCOR    Take 20 mg by mouth every Monday, Wednesday, and Friday.      sodium bicarbonate 650 MG tablet   Take 650 mg by mouth 2 (two) times daily.      sulfamethoxazole-trimethoprim 800-160 MG per tablet   Commonly known as: BACTRIM DS   Take 1 tablet by mouth every Monday, Wednesday, and Friday.      tacrolimus 1 MG capsule   Commonly known as: PROGRAF   Take 2-3 mg by mouth 2 (two) times daily. 3  capsules in the morning  2 capsules at night           Follow-up Information    Follow up with PANG,RICHARD, MD. Schedule an appointment as soon as possible for a visit in 1 week.   Contact information:   [address], [address] [place] [address] [phone]       Follow up with Pierce Crane, MD. Schedule an appointment as soon as possible for a visit in 1 week.   Contact information:   [address] [place] [address] [phone]       Follow up with [facility] center. (CBC and BMET to be checked, for when patient has weekly Aranesp treatments.)           The results of significant diagnostics from this hospitalization (including imaging, microbiology, ancillary and laboratory) are listed below for reference.    Significant Diagnostic Studies: Ct Abdomen Pelvis Wo Contrast  06/10/2012  *RADIOLOGY REPORT*  Clinical Data: Decreasing hemoglobin and  CT ABDOMEN AND PELVIS WITHOUT CONTRAST  Technique:  Multidetector CT imaging of the abdomen and pelvis was performed following the standard protocol without intravenous contrast.  Comparison: 07/21/2008  Findings: Small bilateral pleural effusions are evident.  There is trace pericardial thickening or fluid.  No focal abnormalities seen in the liver or spleen on this study performed without intravenous contrast material.  The stomach, duodenum, pancreas, gallbladder, adrenal glands, and abdominal bowel loops are unremarkable.  The native kidneys are atrophic.  Transplant kidney is identified in the right  abdomen.  There is a trace amount of fluid around the transplant kidney.  No hydronephrosis in the transplant kidney.  No abdominal aortic aneurysm.  Small lymph nodes are seen in the retroperitoneal space, but no abdominal lymphadenopathy.  Imaging through the pelvis shows a tiny amount of free fluid.  The bladder is moderately distended.  Uterus is unremarkable.  There is no adnexal mass.  No pelvic sidewall lymphadenopathy.  No colonic diverticulitis.  Terminal ileum is normal. The appendix is not visualized, but there is no edema or inflammation in the region of the cecum.  Bone windows reveal no worrisome lytic or sclerotic osseous lesions.  IMPRESSION: Status post kidney transplant with a tiny amount of fluid around the transplant kidney.  No hydronephrosis of the transplant kidney.  No evidence for retroperitoneal hemorrhage.  Trace amount of free fluid identified in the anatomic pelvis.  Small bilateral pleural effusions.   Original Report Authenticated By: ERIC A. MANSELL, M.D.    Dg Chest 2 View  06/02/2012  *RADIOLOGY REPORT*  Clinical Data: Preop for dialysis catheter placement.  CHEST - 2 VIEW  Comparison: Chest x-ray 10/23/2008.  Findings: The cardiac silhouette, mediastinal and hilar contours are within normal limits and stable.  There are stable upper lobe scarring changes bilaterally.  No infiltrates, edema or effusions. The bony thorax is intact.  IMPRESSION: No acute cardiopulmonary findings. Stable upper lobe scarring changes.   Original Report Authenticated By: P. Loralie Champagne, M.D.    Dg Chest Port 1 View  06/09/2012  *RADIOLOGY REPORT*  Clinical Data: Chest pain and shortness of breath.  PORTABLE CHEST - 1 VIEW  Comparison: PA and lateral chest 06/02/2012.  Findings: There is cardiomegaly and mild interstitial edema.  No pneumothorax, consolidative process or pleural effusion.  No focal bony abnormality.  IMPRESSION: Cardiomegaly and mild interstitial edema.   Original Report  Authenticated By: Bernadene Bell. Maricela Curet, M.D.     Microbiology: No results found for  this or any previous visit (from the past 240 hour(s)).   Labs: Basic Metabolic Panel:  Lab 06/12/12 4098 06/12/12 0700 06/11/12 0655 06/10/12 1727 06/10/12 0230 06/09/12 2014 06/09/12 0922  NA 139 138 138 -- 139 -- 132*  K 3.5 3.6 3.1* -- 3.4* -- 3.6  CL 102 101 100 -- 103 -- 100  CO2 17* 18* 19 -- 18* -- 16*  GLUCOSE 120* 127* 124* 466* 108* -- --  BUN 72* 74* 62* -- 49* -- 49*  CREATININE 5.95* 6.33* 6.10* -- 5.37* 5.22* --  CALCIUM 8.6 8.7 8.7 -- 8.6 -- 8.4  MG -- -- -- -- -- -- --  PHOS -- -- 7.1* -- -- -- --   Liver Function Tests:  Lab 06/11/12 0655 06/09/12 0922  AST -- 15  ALT -- 10  ALKPHOS -- 72  BILITOT -- 0.2*  PROT -- 5.9*  ALBUMIN 3.2* 3.0*   No results found for this basename: LIPASE:5,AMYLASE:5 in the last 168 hours No results found for this basename: AMMONIA:5 in the last 168 hours CBC:  Lab 06/12/12 0700 06/11/12 0655 06/10/12 0230 06/09/12 2014 06/09/12 0922  WBC 5.8 5.8 7.6 8.1 9.1  NEUTROABS -- -- -- 6.2 7.4  HGB 8.0* 7.8* 7.5* 7.2* 4.6*  HCT 23.1* 22.7* 21.5* 20.7* 14.0*  MCV 85.2 84.4 86.3 85.9 85.4  PLT 311 288 258 237 247   Cardiac Enzymes:  Lab 06/10/12 0134 06/09/12 2014 06/09/12 0923  CKTOTAL -- -- --  CKMB -- -- --  CKMBINDEX -- -- --  TROPONINI 1.89* 1.60* 1.09*   BNP: BNP (last 3 results) No results found for this basename: PROBNP:3 in the last 8760 hours CBG:  Lab 06/12/12 1125 06/12/12 0743 06/11/12 2204 06/11/12 1601 06/11/12 1200  GLUCAP 143* 108* 291* 216* 217*    Time coordinating discharge: 25 minutes  Signed:  Kayen Grabel A  [facility] 06/12/2012, 1:57 PM

## 2012-06-12 NOTE — Progress Notes (Signed)
PA Student Daily Progress Note [facility] Subjective: Pt has returned from her bone marrow biopsy this morning.  She is eager to go home today.  She reports feeling much better with increased energy.  She denies any breathing difficulty or lower extremity edema.  She denies any chest pressure or congestion.  Pt has no new complaints today.    Objective:  Filed Vitals:   06/12/12 0830 06/12/12 0836 06/12/12 0840 06/12/12 0944  BP: 159/89 165/92 158/85 132/78  Pulse: 81 80 84 79  Temp:    98.4 F (36.9 C)  TempSrc:    Oral  Resp: 16 15 20 20   Height:      Weight:      SpO2: 100% 100% 100% 100%   Physical Exam: General appearance : Pt awake and alert sitting quietly in bed in no acute distress. Neck: no JVD noted.  Chest: breathing unlabored.  Lungs clear to auscultation bilaterally CVS: Regular rate and rhythm  Abdomen: no abdominal distention, tenderness or rigidity.  Extremities: No lower extremity edema noted. AV graft to left arm with palpable pulse.  Neurology: Awake and oriented X 3,  Psych: appropriate affect.  Pt is eager to go home.    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  New AV graft in place in preparation for future dialysis but no need for dialysis at this time. Noted a slight increase in her Creatinine today to 5.95.  Pt continues to have adequate urine output with out yesterday.  Pt is currently on 40 mg of Lasix orally BID.  Her home dose was 40 mg po daily.  Will continue current dose of lasix (40 mg BID) at discharge.  BP was somewhat elevated this morning at 166/85 but has gone down to 132/78 this afternoon.  Pt kidney function is stable at this time.  Ms Zuccaro will continue to follow up with Dr. Hyman Hopes as an outpatient.   2. Anemia- Hgb increased to 8 today. Her bone marrow biopsy was completed this morning but results are not back yet.  Pt has a 6 month hx of persistent anemia despite receiving aranesp weekly.   Abdominal CT showed no abdominal blood loss.  3. Hypokalemia- potassium increased to 3.5 today.  Potassium replacement was effective.  K+ is not on Ms. Govea's home med list.  Recommend reducing supplementation to  K+ 20 mEq once daily at discharge.  Labs: Basic Metabolic Panel:  Lab 06/12/12 1610 06/12/12 0700 06/11/12 0655  NA 139 138 138  K 3.5 3.6 3.1*  CL 102 101 100  CO2 17* 18* 19  GLUCOSE 120* 127* 124*  BUN 72* 74* 62*  CREATININE 5.95* 6.33* 6.10*  CALCIUM 8.6 8.7 8.7  ALB -- -- --  PHOS -- -- 7.1*   Liver Function Tests:  Lab 06/11/12 0655 06/09/12 0922  AST -- 15  ALT -- 10  ALKPHOS -- 72  BILITOT -- 0.2*  PROT -- 5.9*  ALBUMIN 3.2* 3.0*   CBC:  Lab 06/12/12 0700 06/11/12 0655 06/10/12 0230 06/09/12 2014 06/09/12 0922  WBC 5.8 5.8 7.6 -- --  NEUTROABS -- -- -- 6.2 7.4  HGB 8.0* 7.8* 7.5* -- --  HCT 23.1* 22.7* 21.5* -- --  MCV 85.2 84.4 86.3 85.9 85.4  PLT 311 288 258 -- --   Blood Culture    Component Value Date/Time   SDES BLOOD HEMODIALYSIS CATHETER ARTERIAL DRAW 12/18/2008 0745   SPECREQUEST BOTTLES DRAWN AEROBIC AND ANAEROBIC 10CC 12/18/2008  0745   CULT NO GROWTH 5 DAYS 12/18/2008 0745   REPTSTATUS 12/24/2008 FINAL 12/18/2008 0745    Cardiac Enzymes:  Lab 06/10/12 0134 06/09/12 2014 06/09/12 0923  CKTOTAL -- -- --  CKMB -- -- --  CKMBINDEX -- -- --  TROPONINI 1.89* 1.60* 1.09*   CBG:  Lab 06/12/12 1125 06/12/12 0743 06/11/12 2204 06/11/12 1601 06/11/12 1200  GLUCAP 143* 108* 291* 216* 217*   Studies/Results: Ct Abdomen Pelvis Wo Contrast  06/10/2012  *RADIOLOGY REPORT*  Clinical Data: Decreasing hemoglobin and  CT ABDOMEN AND PELVIS WITHOUT CONTRAST  Technique:  Multidetector CT imaging of the abdomen and pelvis was performed following the standard protocol without intravenous contrast.  Comparison: 07/21/2008  Findings: Small bilateral pleural effusions are evident.  There is trace pericardial thickening or fluid.  No focal abnormalities seen  in the liver or spleen on this study performed without intravenous contrast material.  The stomach, duodenum, pancreas, gallbladder, adrenal glands, and abdominal bowel loops are unremarkable.  The native kidneys are atrophic.  Transplant kidney is identified in the right abdomen.  There is a trace amount of fluid around the transplant kidney.  No hydronephrosis in the transplant kidney.  No abdominal aortic aneurysm.  Small lymph nodes are seen in the retroperitoneal space, but no abdominal lymphadenopathy.  Imaging through the pelvis shows a tiny amount of free fluid.  The bladder is moderately distended.  Uterus is unremarkable.  There is no adnexal mass.  No pelvic sidewall lymphadenopathy.  No colonic diverticulitis.  Terminal ileum is normal. The appendix is not visualized, but there is no edema or inflammation in the region of the cecum.  Bone windows reveal no worrisome lytic or sclerotic osseous lesions.  IMPRESSION: Status post kidney transplant with a tiny amount of fluid around the transplant kidney.  No hydronephrosis of the transplant kidney.  No evidence for retroperitoneal hemorrhage.  Trace amount of free fluid identified in the anatomic pelvis.  Small bilateral pleural effusions.   Original Report Authenticated By: ERIC A. MANSELL, M.D.    Medications: Scheduled Meds:   . amLODipine  10 mg Oral Daily  . aspirin EC  81 mg Oral Daily  . darbepoetin (ARANESP) injection - NON-DIALYSIS  150 mcg Subcutaneous Q Tue-1800  . escitalopram  10 mg Oral Daily  . fentaNYL      . furosemide  40 mg Oral BID  . insulin aspart  0-20 Units Subcutaneous TID WC  . insulin aspart  3 Units Subcutaneous TID WC  . insulin glargine  60 Units Subcutaneous QHS  . labetalol  100 mg Oral TID  . metoCLOPramide  5 mg Oral QID  . midazolam      . mycophenolate  750 mg Oral BID  . pantoprazole  40 mg Oral Q1200  . potassium chloride  20 mEq Oral BID  . predniSONE  20 mg Oral Q breakfast  . simvastatin  20 mg  Oral Q M,W,F-1800  . sodium bicarbonate  650 mg Oral BID  . sulfamethoxazole-trimethoprim  1 tablet Oral Q M,W,F  . tacrolimus  2 mg Oral QHS  . tacrolimus  3 mg Oral Daily  . zolpidem  5 mg Oral Once  . DISCONTD: furosemide  80 mg Intravenous TID  . DISCONTD: potassium chloride  20 mEq Oral BID   Continuous Infusions:  PRN Meds:.acetaminophen, acetaminophen, albuterol, fentaNYL, HYDROcodone-acetaminophen, midazolam, ondansetron (ZOFRAN) IV, ondansetron   This is a Psychologist, occupational Note.  The care of the patient was discussed with Dr.  Hyman Hopes and the assessment and plan formulated with their assistance.  Please see their attached note for official documentation of the daily encounter.  Delmer Islam PA-S2 06/12/2012, 12:14 PM

## 2012-06-15 ENCOUNTER — Telehealth (HOSPITAL_COMMUNITY): Payer: Self-pay | Admitting: *Deleted

## 2012-06-15 ENCOUNTER — Encounter: Payer: Self-pay | Admitting: Vascular Surgery

## 2012-06-15 NOTE — Telephone Encounter (Signed)
No answer, message left

## 2012-06-16 ENCOUNTER — Encounter: Payer: Self-pay | Admitting: Vascular Surgery

## 2012-06-16 ENCOUNTER — Ambulatory Visit (INDEPENDENT_AMBULATORY_CARE_PROVIDER_SITE_OTHER): Payer: BC Managed Care – PPO | Admitting: Vascular Surgery

## 2012-06-16 ENCOUNTER — Encounter (HOSPITAL_COMMUNITY): Payer: Self-pay | Admitting: Pharmacy Technician

## 2012-06-16 VITALS — BP 133/63 | HR 85 | Ht 67.0 in | Wt 179.0 lb

## 2012-06-16 DIAGNOSIS — N186 End stage renal disease: Secondary | ICD-10-CM

## 2012-06-16 NOTE — Op Note (Signed)
OPERATIVE REPORT  DATE OF SURGERY: 06/16/2012  PATIENT: [name], 36 y.o. female MRN: [id number]  DOB: 11-Sep-1976  PRE-OPERATIVE DIAGNOSIS: Chronic renal insufficiency  POST-OPERATIVE DIAGNOSIS:  Same  PROCEDURE: Left upper arm AV Gore-Tex graft  SURGEON:  Gretta Began, M.D.    ANESTHESIA:  Local with sedation  EBL: Minimal ml     BLOOD ADMINISTERED: None  DRAINS: None  SPECIMEN: None  COUNTS CORRECT:  YES  PLAN OF CARE:  PACU   PATIENT DISPOSITION:  PACU - hemodynamically stable  PROCEDURE DETAILS: The patient was taken to the operating room placed supine position where the area the left arm and external were prepped and draped in sterile fashion. Incision was made over the antecubital space and carried down to isolate the brachial artery. Had been a prior forearm graft many years ago in the form graft was excised. Separate incision was made at the axilla and the axillary vein was of good caliber. A tunnel was created from the level of the antecubital space to the axilla and a 6 mm standard wall stretch graft was brought through the tunnel. The axillary vein was occluded proximally and distally and was opened with an 11 blade and extended longitudinally with Potts scissors. The graft was spatulated and sewn end-to-side to the vein with a running 6-0 Prolene suture. The graft was flushed with heparinized and reoccluded. Next the brachial artery was occluded proximal and distally was opened with an 11 blade and sent longitudinally Potts scissors. A small arteriotomy was created. The graft was cut to appropriate length and sewn end-to-side to the artery with a running 6-0 Prolene suture. Clamps removed and good thrill was noted. Wound irrigated with saline. Hemostasis was obtained with cautery. Wounds were closed with 3-0 Vicryl in the subcutaneous and subcuticular tissue. Sterile dressing was applied and the patient was taken to the recovery room in stable condition   Gretta Began,  M.D. 06/16/2012 12:02 PM

## 2012-06-16 NOTE — Progress Notes (Signed)
The patient has today for followup of her left upper arm AV Gore-Tex graft placement by myself on 06/03/2012. This was done as an outpatient and she is recovered without difficulty. She is not currently on dialysis. Her antecubital and axillary incisions are both well healed. She has no evidence of infection. She has an excellent thrill and bruit over her left upper arm AV Gore-Tex graft. She does have a palpable left radial and ulnar pulse with no evidence of steal  Impression and plan: Stable recovery following left upper arm AV Gore-Tex graft placement. This should be available if needed and 2 additional weeks

## 2012-06-18 ENCOUNTER — Other Ambulatory Visit: Payer: Self-pay | Admitting: Radiology

## 2012-06-18 ENCOUNTER — Encounter (HOSPITAL_COMMUNITY)
Admission: RE | Admit: 2012-06-18 | Discharge: 2012-06-18 | Disposition: A | Discharge status: Home / Self Care | Payer: Medicare Other | Source: Ambulatory Visit | Attending: Nephrology | Admitting: Nephrology

## 2012-06-18 DIAGNOSIS — N183 Chronic kidney disease, stage 3 unspecified: Secondary | ICD-10-CM | POA: Insufficient documentation

## 2012-06-18 DIAGNOSIS — D638 Anemia in other chronic diseases classified elsewhere: Secondary | ICD-10-CM | POA: Insufficient documentation

## 2012-06-18 LAB — POCT HEMOGLOBIN-HEMACUE: Hemoglobin: 8.4 g/dL — ABNORMAL LOW (ref 12.0–15.0)

## 2012-06-18 LAB — FERRITIN: Ferritin: 1168 ng/mL — ABNORMAL HIGH (ref 10–291)

## 2012-06-18 LAB — IRON AND TIBC: UIBC: 96 ug/dL — ABNORMAL LOW (ref 125–400)

## 2012-06-18 MED ORDER — DARBEPOETIN ALFA-POLYSORBATE 100 MCG/0.5ML IJ SOLN
INTRAMUSCULAR | Status: AC
  Administered 2012-06-18: 100 ug via SUBCUTANEOUS
  Filled 2012-06-18: qty 0.5

## 2012-06-18 MED ORDER — DARBEPOETIN ALFA-POLYSORBATE 100 MCG/0.5ML IJ SOLN
100.0000 ug | INTRAMUSCULAR | Status: DC
  Administered 2012-06-18: 100 ug via SUBCUTANEOUS

## 2012-06-23 ENCOUNTER — Ambulatory Visit (HOSPITAL_COMMUNITY): Admission: RE | Admit: 2012-06-23 | Payer: Medicare Other | Source: Ambulatory Visit

## 2012-06-23 ENCOUNTER — Inpatient Hospital Stay (HOSPITAL_COMMUNITY): Admit: 2012-06-23 | Discharge: 2012-06-23 | Payer: BC Managed Care – PPO | Attending: Oncology | Admitting: Oncology

## 2012-06-25 ENCOUNTER — Encounter (HOSPITAL_COMMUNITY)
Admission: RE | Admit: 2012-06-25 | Discharge: 2012-06-25 | Disposition: A | Discharge status: Home / Self Care | Payer: Medicare Other | Source: Ambulatory Visit | Attending: Nephrology | Admitting: Nephrology

## 2012-06-25 LAB — POCT HEMOGLOBIN-HEMACUE: Hemoglobin: 7.6 g/dL — ABNORMAL LOW (ref 12.0–15.0)

## 2012-06-25 MED ORDER — DARBEPOETIN ALFA-POLYSORBATE 100 MCG/0.5ML IJ SOLN
INTRAMUSCULAR | Status: AC
  Administered 2012-06-25: 100 ug via SUBCUTANEOUS
  Filled 2012-06-25: qty 0.5

## 2012-06-25 MED ORDER — DARBEPOETIN ALFA-POLYSORBATE 100 MCG/0.5ML IJ SOLN
100.0000 ug | INTRAMUSCULAR | Status: DC
  Administered 2012-06-25: 100 ug via SUBCUTANEOUS

## 2012-06-25 MED ORDER — DARBEPOETIN ALFA-POLYSORBATE 100 MCG/0.5ML IJ SOLN
100.0000 ug | INTRAMUSCULAR | Status: DC
Start: 2012-06-25 — End: 2012-06-26
  Administered 2012-06-25: 100 ug via SUBCUTANEOUS
  Filled 2012-06-25: qty 0.5

## 2012-06-25 NOTE — Progress Notes (Signed)
0845 hemoglobin 7.6 phoned to [facility] at Dr Marland Mcalpine office.  She was advised patient complains of shortness of breath and weakness.  She will advise MD and phone back with instructions

## 2012-06-29 ENCOUNTER — Telehealth: Payer: Self-pay | Admitting: *Deleted

## 2012-06-29 NOTE — Telephone Encounter (Signed)
patient confirmed over the phone the new date and time 08-05-2012 starting at 3:30pm

## 2012-07-01 NOTE — Addendum Note (Signed)
Addendum  created 07/01/12 0821 by Latarshia Jersey, MD   Modules edited:Anesthesia Events    

## 2012-07-01 NOTE — Addendum Note (Signed)
Addendum  created 07/01/12 9147 by Eilene Ghazi, MD   Modules edited:Anesthesia Events

## 2012-07-02 ENCOUNTER — Encounter (HOSPITAL_COMMUNITY)
Admission: RE | Admit: 2012-07-02 | Discharge: 2012-07-02 | Disposition: A | Discharge status: Home / Self Care | Payer: Medicare Other | Source: Ambulatory Visit | Attending: Nephrology | Admitting: Nephrology

## 2012-07-02 ENCOUNTER — Encounter (HOSPITAL_COMMUNITY): Payer: Self-pay

## 2012-07-02 ENCOUNTER — Encounter: Payer: Self-pay | Admitting: *Deleted

## 2012-07-02 LAB — POCT HEMOGLOBIN-HEMACUE: Hemoglobin: 6.5 g/dL — CL (ref 12.0–15.0)

## 2012-07-02 MED ORDER — DARBEPOETIN ALFA-POLYSORBATE 200 MCG/0.4ML IJ SOLN
200.0000 ug | INTRAMUSCULAR | Status: DC
  Administered 2012-07-02: 200 ug via SUBCUTANEOUS
  Filled 2012-07-02: qty 0.4

## 2012-07-02 NOTE — Progress Notes (Unsigned)
REC. CALL FROM [facility]. PT CONT TO GET WEEKLY ARENESP INJ WITH BLOOD TRANSFUSIONS. HGB IS CONTINUING TO DROP. MD AT [facility] REQUESTS PT TO BE SEEN SOONER THAT 07/24/12 HER REGULARLY SCH APPOINTMENT WITH DR Donnie Coffin

## 2012-07-02 NOTE — Progress Notes (Addendum)
0815 Hemoglobin phoned to [facility] at [facility]. Ms Justo complains of shortness of breath and weakness but denies seeing blood in stool or other symptoms.  [place] will advise Dr Hyman Hopes and call back with further instructions from him.  [id number] order received from Dr Hyman Hopes via Leonides Sake to give patient currently ordered injection and advise her to contact her oncologist and see him as soon as possible.  Leonides Sake states she will call oncologist to try to facilitate this as well.  Patient advised and voices understanding.

## 2012-07-03 ENCOUNTER — Telehealth: Payer: Self-pay | Admitting: *Deleted

## 2012-07-03 NOTE — Telephone Encounter (Signed)
per orders from desk nurse moved patient appointment according 07-08-2012 labs at 12:30pm followed by md appoitments

## 2012-07-07 ENCOUNTER — Other Ambulatory Visit: Payer: Self-pay | Admitting: *Deleted

## 2012-07-07 DIAGNOSIS — C569 Malignant neoplasm of unspecified ovary: Secondary | ICD-10-CM

## 2012-07-07 DIAGNOSIS — N189 Chronic kidney disease, unspecified: Secondary | ICD-10-CM

## 2012-07-08 ENCOUNTER — Ambulatory Visit (HOSPITAL_BASED_OUTPATIENT_CLINIC_OR_DEPARTMENT_OTHER): Payer: Medicare Other

## 2012-07-08 ENCOUNTER — Ambulatory Visit (HOSPITAL_BASED_OUTPATIENT_CLINIC_OR_DEPARTMENT_OTHER): Payer: BC Managed Care – PPO | Admitting: Oncology

## 2012-07-08 ENCOUNTER — Encounter (HOSPITAL_COMMUNITY)
Admission: RE | Admit: 2012-07-08 | Discharge: 2012-07-08 | Disposition: A | Discharge status: Home / Self Care | Payer: Medicare Other | Source: Ambulatory Visit | Attending: Nephrology | Admitting: Nephrology

## 2012-07-08 ENCOUNTER — Other Ambulatory Visit (HOSPITAL_BASED_OUTPATIENT_CLINIC_OR_DEPARTMENT_OTHER): Payer: BC Managed Care – PPO

## 2012-07-08 ENCOUNTER — Telehealth: Payer: Self-pay | Admitting: *Deleted

## 2012-07-08 ENCOUNTER — Ambulatory Visit: Payer: BC Managed Care – PPO

## 2012-07-08 VITALS — BP 118/71 | HR 92 | Temp 98.1°F | Resp 20 | Ht 67.0 in | Wt 176.3 lb

## 2012-07-08 VITALS — BP 147/82 | HR 88 | Temp 98.1°F | Resp 18

## 2012-07-08 DIAGNOSIS — R0989 Other specified symptoms and signs involving the circulatory and respiratory systems: Secondary | ICD-10-CM

## 2012-07-08 DIAGNOSIS — D649 Anemia, unspecified: Secondary | ICD-10-CM

## 2012-07-08 DIAGNOSIS — C569 Malignant neoplasm of unspecified ovary: Secondary | ICD-10-CM

## 2012-07-08 DIAGNOSIS — R0609 Other forms of dyspnea: Secondary | ICD-10-CM

## 2012-07-08 DIAGNOSIS — N189 Chronic kidney disease, unspecified: Secondary | ICD-10-CM

## 2012-07-08 LAB — CBC WITH DIFFERENTIAL/PLATELET
Basophils Absolute: 0 10*3/uL (ref 0.0–0.1)
Eosinophils Absolute: 0.1 10*3/uL (ref 0.0–0.5)
HGB: 6 g/dL — CL (ref 11.6–15.9)
LYMPH%: 15.2 % (ref 14.0–49.7)
MCV: 87.6 fL (ref 79.5–101.0)
MONO#: 0.3 10*3/uL (ref 0.1–0.9)
MONO%: 6.3 % (ref 0.0–14.0)
NEUT#: 3.6 10*3/uL (ref 1.5–6.5)
Platelets: 202 10*3/uL (ref 145–400)
RDW: 15 % — ABNORMAL HIGH (ref 11.2–14.5)

## 2012-07-08 LAB — COMPREHENSIVE METABOLIC PANEL (CC13)
CO2: 16 mEq/L — ABNORMAL LOW (ref 22–29)
Calcium: 7.8 mg/dL — ABNORMAL LOW (ref 8.4–10.4)
Chloride: 110 mEq/L — ABNORMAL HIGH (ref 98–107)
Glucose: 147 mg/dl — ABNORMAL HIGH (ref 70–99)
Sodium: 138 mEq/L (ref 136–145)
Total Bilirubin: 0.5 mg/dL (ref 0.20–1.20)
Total Protein: 5.6 g/dL — ABNORMAL LOW (ref 6.4–8.3)

## 2012-07-08 MED ORDER — DIPHENHYDRAMINE HCL 25 MG PO CAPS
25.0000 mg | ORAL_CAPSULE | Freq: Once | ORAL | Status: AC
  Administered 2012-07-08: 25 mg via ORAL

## 2012-07-08 MED ORDER — SODIUM CHLORIDE 0.9 % IV SOLN
250.0000 mL | Freq: Once | INTRAVENOUS | Status: AC
  Administered 2012-07-08: 50 mL via INTRAVENOUS

## 2012-07-08 MED ORDER — ACETAMINOPHEN 325 MG PO TABS
650.0000 mg | ORAL_TABLET | Freq: Once | ORAL | Status: AC
  Administered 2012-07-08: 650 mg via ORAL

## 2012-07-08 NOTE — Patient Instructions (Addendum)
Today Wednesday Oct. 2, 2013, you received two unit of Packed Red Blood Cells.    Please call [facility] [phone] if you have further questions.  Call 911 or go to the nearest emergency facility if you would require  any immediate attention.  Dr. Donnie Coffin is your Oncologist at [facility].   Blood Transfusion Information WHAT IS A BLOOD TRANSFUSION? A transfusion is the replacement of blood or some of its parts. Blood is made up of multiple cells which provide different functions.  Red blood cells carry oxygen and are used for blood loss replacement.  White blood cells fight against infection.  Platelets control bleeding.  Plasma helps clot blood.  Other blood products are available for specialized needs, such as hemophilia or other clotting disorders. BEFORE THE TRANSFUSION  Who gives blood for transfusions?   You may be able to donate blood to be used at a later date on yourself (autologous donation).  Relatives can be asked to donate blood. This is generally not any safer than if you have received blood from a stranger. The same precautions are taken to ensure safety when a relative's blood is donated.  Healthy volunteers who are fully evaluated to make sure their blood is safe. This is blood bank blood. Transfusion therapy is the safest it has ever been in the practice of medicine. Before blood is taken from a donor, a complete history is taken to make sure that person has no history of diseases nor engages in risky social behavior (examples are intravenous drug use or sexual activity with multiple partners). The donor's travel history is screened to minimize risk of transmitting infections, such as malaria. The donated blood is tested for signs of infectious diseases, such as HIV and hepatitis. The blood is then tested to be sure it is compatible with you in order to minimize the chance of a transfusion reaction. If you or a relative donates blood, this is often done in anticipation of surgery and is  not appropriate for emergency situations. It takes many days to process the donated blood. RISKS AND COMPLICATIONS Although transfusion therapy is very safe and saves many lives, the main dangers of transfusion include:   Getting an infectious disease.  Developing a transfusion reaction. This is an allergic reaction to something in the blood you were given. Every precaution is taken to prevent this. The decision to have a blood transfusion has been considered carefully by your caregiver before blood is given. Blood is not given unless the benefits outweigh the risks. AFTER THE TRANSFUSION  Right after receiving a blood transfusion, you will usually feel much better and more energetic. This is especially true if your red blood cells have gotten low (anemic). The transfusion raises the level of the red blood cells which carry oxygen, and this usually causes an energy increase.  The nurse administering the transfusion will monitor you carefully for complications. HOME CARE INSTRUCTIONS  No special instructions are needed after a transfusion. You may find your energy is better. Speak with your caregiver about any limitations on activity for underlying diseases you may have. SEEK MEDICAL CARE IF:   Your condition is not improving after your transfusion.  You develop redness or irritation at the intravenous (IV) site. SEEK IMMEDIATE MEDICAL CARE IF:  Any of the following symptoms occur over the next 12 hours:  Shaking chills.  You have a temperature by mouth above 102 F (38.9 C), not controlled by medicine.  Chest, back, or muscle pain.  People around you  feel you are not acting correctly or are confused.  Shortness of breath or difficulty breathing.  Dizziness and fainting.  You get a rash or develop hives.  You have a decrease in urine output.  Your urine turns a dark color or changes to pink, red, or brown. Any of the following symptoms occur over the next 10 days:  You have  a temperature by mouth above 102 F (38.9 C), not controlled by medicine.  Shortness of breath.  Weakness after normal activity.  The white part of the eye turns yellow (jaundice).  You have a decrease in the amount of urine or are urinating less often.  Your urine turns a dark color or changes to pink, red, or brown. Document Released: 09/20/2000 Document Revised: 12/16/2011 Document Reviewed: 05/09/2008 [facility] Patient Information 2013 [place], [place]. [name]

## 2012-07-08 NOTE — Telephone Encounter (Signed)
Printed out face sheet gave to Tiffany in the medical records department to make the referral with [email or web address] per the notes from dr.[email or web address] on 07-08-2012

## 2012-07-09 LAB — TYPE AND SCREEN
Antibody Screen: NEGATIVE
Unit division: 0

## 2012-07-10 ENCOUNTER — Encounter (HOSPITAL_COMMUNITY)
Admission: RE | Admit: 2012-07-10 | Discharge: 2012-07-10 | Disposition: A | Discharge status: Home / Self Care | Payer: Medicare Other | Source: Ambulatory Visit | Attending: Nephrology | Admitting: Nephrology

## 2012-07-10 LAB — POCT HEMOGLOBIN-HEMACUE: Hemoglobin: 8.9 g/dL — ABNORMAL LOW (ref 12.0–15.0)

## 2012-07-10 MED ORDER — DARBEPOETIN ALFA-POLYSORBATE 200 MCG/0.4ML IJ SOLN: 200.0000 ug | INTRAMUSCULAR | Status: DC

## 2012-07-10 MED ORDER — DARBEPOETIN ALFA-POLYSORBATE 200 MCG/0.4ML IJ SOLN
INTRAMUSCULAR | Status: AC
  Administered 2012-07-10: 200 ug
  Filled 2012-07-10: qty 0.4

## 2012-07-12 NOTE — Progress Notes (Signed)
Hematology and Oncology Follow Up Visit  [name] [id number] [birth year] 35 y.o. 07/12/2012 1:48 PM   DIAGNOSIS:    Encounter Diagnosis  Name Primary?  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 She has as of yet not restarted dialysis. She was seen by me in the hospital that number of weeks ago for evaluation of severe anemia. Over the past 3 months or so since her creatinine started to show evidence of rejection she has become more transfusion dependent. This despite being on growth factors. She was evaluated in the hospital and had a number of blood tests performed. Also a bone marrow biopsy was done which showed adequate iron studies and no obvious evidence evidence of intrinsic marrow dysfunction. She returns today for followup.  Interim History:  As above she has become more dyspneic. While in hospital I recommended that her dose of Mass. be increased and also give more frequently. As a result it would appear that her transfusion dependence has decreased and since discharge about 5 weeks ago she has not had a blood transfusion . She does feel weak and somewhat distant on exertion. She denies any pain.  Medications: I have reviewed the patient's current medications.  Allergies:  Allergies  Allergen Reactions  . Ace Inhibitors Other (See Comments)     cause cough    Past Medical History, Surgical history, Social history, and Family History were reviewed and updated.  Review of Systems: Constitutional:  Negative for fever, chills, night sweats, anorexia, weight loss, pain. Cardiovascular: negative Respiratory: negative Neurological: negative Dermatological: negative ENT: negative Skin Gastrointestinal: negative Genito-Urinary: negative Hematological and Lymphatic: negative Breast:  negative Musculoskeletal: negative Remaining ROS negative.  Physical Exam:  Blood pressure 118/71, pulse 92, temperature 98.1 F (36.7 C), resp. rate 20, height 5\' 7"  (1.702 m), weight 176 lb 4.8 oz (79.969 kg).  ECOG: 0  HEENT:  Sclerae anicteric, conjunctivae pink.  Oropharynx clear.  No mucositis or candidiasis.  Nodes:  No cervical, supraclavicular, or axillary lymphadenopathy palpated. .  No masses, discharge, skin change, or nipple inversion..  Lungs:  Clear to auscultation bilaterally.  No crackles, rhonchi, or wheezes.  Heart:  Regular rate and rhythm.  Abdomen:  Soft, nontender.  Positive bowel sounds.  No organomegaly or masses palpated.  Musculoskeletal:  No focal spinal tenderness to palpation.  Extremities:  Benign.  No peripheral edema or cyanosis.  Skin:  Benign.  Neuro:  Nonfocal.

## 2012-07-17 ENCOUNTER — Encounter (HOSPITAL_COMMUNITY): Payer: BC Managed Care – PPO

## 2012-07-20 ENCOUNTER — Other Ambulatory Visit: Payer: Self-pay | Admitting: Oncology

## 2012-07-20 DIAGNOSIS — D649 Anemia, unspecified: Secondary | ICD-10-CM

## 2012-07-21 ENCOUNTER — Telehealth: Payer: Self-pay | Admitting: Oncology

## 2012-07-21 NOTE — Telephone Encounter (Signed)
C/D 07/21/12 for appt.08/19/12 °

## 2012-07-21 NOTE — Telephone Encounter (Signed)
S/W pt in re NP appt 11/13 @ 11:30, lab 11/04 @ 8:15 w/ Dr. Cyndie Chime.  Referring Dr. Donnie Coffin

## 2012-07-24 ENCOUNTER — Other Ambulatory Visit: Payer: BC Managed Care – PPO | Admitting: Lab

## 2012-07-24 ENCOUNTER — Ambulatory Visit: Payer: BC Managed Care – PPO | Admitting: Oncology

## 2012-08-10 ENCOUNTER — Other Ambulatory Visit (HOSPITAL_BASED_OUTPATIENT_CLINIC_OR_DEPARTMENT_OTHER): Payer: Medicare Other | Admitting: Lab

## 2012-08-10 DIAGNOSIS — D649 Anemia, unspecified: Secondary | ICD-10-CM

## 2012-08-10 DIAGNOSIS — N189 Chronic kidney disease, unspecified: Secondary | ICD-10-CM

## 2012-08-10 LAB — CBC & DIFF AND RETIC
Basophils Absolute: 0 10*3/uL (ref 0.0–0.1)
EOS%: 3 % (ref 0.0–7.0)
Eosinophils Absolute: 0.1 10*3/uL (ref 0.0–0.5)
HCT: 24.9 % — ABNORMAL LOW (ref 34.8–46.6)
HGB: 8.3 g/dL — ABNORMAL LOW (ref 11.6–15.9)
Immature Retic Fract: 21.5 % — ABNORMAL HIGH (ref 1.60–10.00)
MCH: 29.1 pg (ref 25.1–34.0)
NEUT#: 1.6 10*3/uL (ref 1.5–6.5)
NEUT%: 48.7 % (ref 38.4–76.8)
RDW: 15.3 % — ABNORMAL HIGH (ref 11.2–14.5)
Retic %: 9.29 % — ABNORMAL HIGH (ref 0.70–2.10)
Retic Ct Abs: 264.77 10*3/uL — ABNORMAL HIGH (ref 33.70–90.70)
lymph#: 1.1 10*3/uL (ref 0.9–3.3)

## 2012-08-10 LAB — CHCC SMEAR

## 2012-08-10 LAB — MORPHOLOGY

## 2012-08-12 LAB — FERRITIN: Ferritin: 1248 ng/mL — ABNORMAL HIGH (ref 10–291)

## 2012-08-12 LAB — HEMOGLOBINOPATHY EVALUATION
Hemoglobin Other: 0 %
Hgb A2 Quant: 3.3 % — ABNORMAL HIGH (ref 2.2–3.2)
Hgb A: 95.6 % — ABNORMAL LOW (ref 96.8–97.8)

## 2012-08-12 LAB — IRON AND TIBC
%SAT: 33 % (ref 20–55)
TIBC: 206 ug/dL — ABNORMAL LOW (ref 250–470)
UIBC: 139 ug/dL (ref 125–400)

## 2012-08-19 ENCOUNTER — Ambulatory Visit: Payer: Medicare Other | Admitting: Oncology

## 2012-08-19 ENCOUNTER — Encounter: Payer: Self-pay | Admitting: Oncology

## 2012-08-19 NOTE — Progress Notes (Signed)
37 y/o Asian woman referred for eval of anemia - she came in on 11/4 for lab but failed to report for visit today. Hb 8.3, MCV 87, retic count 9%, Hemoglobin A2 slightly increased @ 3.3%, Hb F normal, Hb S 0%, Iron 67, ferritin 1248. 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 We need to get her in for further eval and

## 2012-08-20 NOTE — Progress Notes (Signed)
Patient failed to report for visit. She will be contacted. I searched hospital list and she is not in hospital

## 2012-10-21 ENCOUNTER — Encounter (HOSPITAL_COMMUNITY): Payer: Self-pay | Admitting: Emergency Medicine

## 2012-10-21 ENCOUNTER — Emergency Department (HOSPITAL_COMMUNITY)
Admission: EM | Admit: 2012-10-21 | Discharge: 2012-10-21 | Disposition: A | Discharge status: Home / Self Care | Payer: Medicare Other | Attending: Emergency Medicine | Admitting: Emergency Medicine

## 2012-10-21 DIAGNOSIS — Z8611 Personal history of tuberculosis: Secondary | ICD-10-CM | POA: Insufficient documentation

## 2012-10-21 DIAGNOSIS — N186 End stage renal disease: Secondary | ICD-10-CM | POA: Insufficient documentation

## 2012-10-21 DIAGNOSIS — Z79899 Other long term (current) drug therapy: Secondary | ICD-10-CM | POA: Insufficient documentation

## 2012-10-21 DIAGNOSIS — R0789 Other chest pain: Secondary | ICD-10-CM | POA: Insufficient documentation

## 2012-10-21 DIAGNOSIS — Z794 Long term (current) use of insulin: Secondary | ICD-10-CM | POA: Insufficient documentation

## 2012-10-21 DIAGNOSIS — I120 Hypertensive chronic kidney disease with stage 5 chronic kidney disease or end stage renal disease: Secondary | ICD-10-CM | POA: Insufficient documentation

## 2012-10-21 DIAGNOSIS — E785 Hyperlipidemia, unspecified: Secondary | ICD-10-CM | POA: Insufficient documentation

## 2012-10-21 DIAGNOSIS — E11319 Type 2 diabetes mellitus with unspecified diabetic retinopathy without macular edema: Secondary | ICD-10-CM | POA: Insufficient documentation

## 2012-10-21 DIAGNOSIS — E1139 Type 2 diabetes mellitus with other diabetic ophthalmic complication: Secondary | ICD-10-CM | POA: Insufficient documentation

## 2012-10-21 DIAGNOSIS — Z7982 Long term (current) use of aspirin: Secondary | ICD-10-CM | POA: Insufficient documentation

## 2012-10-21 DIAGNOSIS — Z8543 Personal history of malignant neoplasm of ovary: Secondary | ICD-10-CM | POA: Insufficient documentation

## 2012-10-21 DIAGNOSIS — B029 Zoster without complications: Secondary | ICD-10-CM | POA: Insufficient documentation

## 2012-10-21 DIAGNOSIS — E119 Type 2 diabetes mellitus without complications: Secondary | ICD-10-CM | POA: Insufficient documentation

## 2012-10-21 DIAGNOSIS — M25519 Pain in unspecified shoulder: Secondary | ICD-10-CM | POA: Insufficient documentation

## 2012-10-21 MED ORDER — HYDROCODONE-ACETAMINOPHEN 5-325 MG PO TABS: 1.0000 | ORAL_TABLET | Freq: Four times a day (QID) | ORAL | Status: DC | PRN

## 2012-10-21 MED ORDER — VALACYCLOVIR HCL 500 MG PO TABS
500.0000 mg | ORAL_TABLET | Freq: Every day | ORAL | Status: DC
  Administered 2012-10-21: 500 mg via ORAL
  Filled 2012-10-21: qty 1

## 2012-10-21 MED ORDER — VALACYCLOVIR HCL 500 MG PO TABS: 500.0000 mg | ORAL_TABLET | Freq: Every day | ORAL | Status: AC

## 2012-10-21 MED ORDER — HYDROCODONE-ACETAMINOPHEN 5-325 MG PO TABS
1.0000 | ORAL_TABLET | Freq: Once | ORAL | Status: AC
  Administered 2012-10-21: 1 via ORAL
  Filled 2012-10-21: qty 1

## 2012-10-21 NOTE — ED Provider Notes (Signed)
History     CSN: [id number]  Arrival date & time 10/21/12  0901   First MD Initiated Contact with Patient 10/21/12 [phone]      No chief complaint on file.   (Consider location/radiation/quality/duration/timing/severity/associated sxs/prior treatment) HPI  37 year old female with hx of ESRD (dialysis T-Th-S), TB, and diabetes presents for evaluation of a rash.  Pt reports 2 days ago she experienced sharp pain to the L side of shoulder.  Pain was intermittent, lasting for seconds and resolved without treatment.  Yesterday pt notices blisters/rash appearing to L upper chest and shoulder.  Rash is itching, burning sensation, persistent, with achy sensation.  Onset gradual, persistent, moderate in severity, non radiating.  Denies fever, n/v/d, sob, or throat swelling.  Denies changes in detergent, shampoo, new pets, or new medications.  Unsure if she has chicken pox in the past or not.  Is not pregnant.  No treatment tried.  Pt is immunocompromised currently on anti-organ rejection medication (Cellcept and Prograf).    Past Medical History  Diagnosis Date  . ESRD (end stage renal disease)     history of peritoneal and Hemodialysis, now s/p renal  transplant  . Renal transplant disorder 2010  . Ovarian tumor, malignant 2005    resected in [place]  . Diabetic retinopathy   . HTN (hypertension)     not well controlled  . 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 HLD (hyperlipidemia)   . CAP (community acquired pneumonia) 2010  . Diabetes mellitus     Past Surgical History  Procedure Date  . Tumor removal 2005    Ovarian, resected in [place]  . Av fistula placement   . Eye surgery     right eye   . Kidney transplant 2010    at [facility]  . Av fistula placement 06/03/2012    Procedure: INSERTION OF ARTERIOVENOUS (AV) GORE-TEX GRAFT ARM;  Surgeon: Larina Earthly, MD;  Location: [facility] OR;  Service:  Vascular;  Laterality: Left;    Family History  Problem Relation Age of Onset  . Diabetes Mother   . Liver cancer Father   . Cancer Father     History  Substance Use Topics  . Smoking status: Never Smoker   . Smokeless tobacco: Never Used  . Alcohol Use: No    OB History    Grav Para Term Preterm Abortions TAB SAB Ect Mult Living                  Review of Systems  Constitutional: Negative for fever and chills.  HENT: Negative for ear pain, sore throat, rhinorrhea, neck pain and neck stiffness.   Eyes: Negative for pain.  Respiratory: Negative for shortness of breath.   Cardiovascular: Positive for chest pain.  Gastrointestinal: Negative for abdominal pain.  Genitourinary: Negative for dysuria.  Musculoskeletal: Negative for myalgias, back pain and arthralgias.  Skin: Positive for rash.  Neurological: Negative for headaches.    Allergies  Ace inhibitors  Home Medications   Current Outpatient Rx  Name  Route  Sig  Dispense  Refill  . AMLODIPINE BESYLATE 10 MG PO TABS   Oral   Take 10 mg by mouth daily with breakfast.          . ASPIRIN EC 81 MG PO TBEC   Oral   Take 81 mg by mouth daily.         [name]  DARBEPOETIN ALFA-POLYSORBATE 150 MCG/0.3ML IJ SOLN   Subcutaneous   Inject 0.3 mLs (150 mcg total) into the skin every Tuesday at 6 PM.   1.68 mL      . ESCITALOPRAM OXALATE 10 MG PO TABS   Oral   Take 10 mg by mouth daily with breakfast.          . FUROSEMIDE 40 MG PO TABS   Oral   Take 1 tablet (40 mg total) by mouth 2 (two) times daily.   60 tablet   0   . INSULIN GLARGINE 100 UNIT/ML [place] SOLN   Subcutaneous   Inject 60 Units into the skin at bedtime.         [name] LABETALOL HCL 100 MG PO TABS   Oral   Take 100 mg by mouth 3 (three) times daily.         [name] METOCLOPRAMIDE HCL 5 MG PO TABS   Oral   Take 5 mg by mouth 4 (four) times daily.         [name] MYCOPHENOLATE MOFETIL 250 MG PO CAPS   Oral   Take 750 mg by mouth 2 (two) times daily.           [name] OMEPRAZOLE 20 MG PO CPDR   Oral   Take 20 mg by mouth daily.         [name] PREDNISONE 5 MG PO TABS   Oral   Take 20 mg by mouth daily.         [name] SIMVASTATIN 20 MG PO TABS   Oral   Take 20 mg by mouth every Monday, Wednesday, and Friday.         . SODIUM BICARBONATE 650 MG PO TABS   Oral   Take 650 mg by mouth 2 (two) times daily.         Lynnda Child DS 800-160 MG PO TABS   Oral   Take 1 tablet by mouth every Monday, Wednesday, and Friday.         [name] TACROLIMUS 1 MG PO CAPS   Oral   Take 2-3 mg by mouth 2 (two) times daily. 3 capsules in the morning 2 capsules at night           There were no vitals taken for this visit.  Physical Exam  Nursing note and vitals reviewed. Constitutional: She is oriented to person, place, and time. She appears well-developed and well-nourished. No distress.  HENT:  Head: Atraumatic.  Right Ear: External ear normal.  Left Ear: External ear normal.  Nose: Nose normal.  Mouth/Throat: Oropharynx is clear and moist. No oropharyngeal exudate.  Eyes: Conjunctivae normal and EOM are normal. Pupils are equal, round, and reactive to light.  Neck: Neck supple.  Cardiovascular: Normal rate and regular rhythm.   Pulmonary/Chest: Effort normal and breath sounds normal. She has no wheezes.    Abdominal: Soft. There is no tenderness.  Musculoskeletal: Normal range of motion.  Lymphadenopathy:    She has no cervical adenopathy.  Neurological: She is alert and oriented to person, place, and time.  Skin: Skin is warm.  Psychiatric: She has a normal mood and affect.    ED Course  Procedures (including critical care time)  Labs Reviewed - No data to display No results found.   No diagnosis found.  1. Shingle  MDM  Pt presents with rash suggestive of shingle.  Rash follows dermatomal pattern, does not cross midline.  She has no lesions in ears,  nose, eyes, or mouth.  Is afebrile. Lung CTAB.  She is  immunocompromised and has hx of renal insufficiency.   9:31 AM i have discussed with my attending and also consult nephrology for antiviral dose as treatment for shingles.  Nephrology consult (Dr. Allena Katz) appreciated. Recommend Valtrex 500mg  once daily x 7 days to be taken at night after dialysis.    BP 160/75  Pulse 92  Temp 98.3 F (36.8 C) (Oral)  Resp 16  SpO2 99%  I have reviewed nursing notes and vital signs.  I reviewed available ER/hospitalization records thought the EMR      Fayrene Helper, [place] 10/21/12 [id number]

## 2012-10-21 NOTE — ED Notes (Signed)
Onset 2 days ago pain left shoulder radiating to neck.  Thought might slept on side wrong pain worsening overtime and developed a light pink rash left shoulder radiating to left side of chest and left upper arm. Pain 8/10 achy sharp.

## 2012-10-21 NOTE — ED Provider Notes (Signed)
Medical screening examination/treatment/procedure(s) were performed by non-physician practitioner and as supervising physician I was immediately available for consultation/collaboration.   Glynn Octave, MD 10/21/12 [phone]

## 2012-10-23 ENCOUNTER — Encounter (HOSPITAL_COMMUNITY): Payer: Self-pay | Admitting: Emergency Medicine

## 2012-10-23 ENCOUNTER — Emergency Department (HOSPITAL_COMMUNITY)
Admission: EM | Admit: 2012-10-23 | Discharge: 2012-10-23 | Disposition: A | Discharge status: Home / Self Care | Payer: Medicare Other | Attending: Emergency Medicine | Admitting: Emergency Medicine

## 2012-10-23 DIAGNOSIS — B029 Zoster without complications: Secondary | ICD-10-CM | POA: Insufficient documentation

## 2012-10-23 DIAGNOSIS — Z7982 Long term (current) use of aspirin: Secondary | ICD-10-CM | POA: Insufficient documentation

## 2012-10-23 DIAGNOSIS — E11319 Type 2 diabetes mellitus with unspecified diabetic retinopathy without macular edema: Secondary | ICD-10-CM | POA: Insufficient documentation

## 2012-10-23 DIAGNOSIS — Z8611 Personal history of tuberculosis: Secondary | ICD-10-CM | POA: Insufficient documentation

## 2012-10-23 DIAGNOSIS — Z794 Long term (current) use of insulin: Secondary | ICD-10-CM | POA: Insufficient documentation

## 2012-10-23 DIAGNOSIS — E1139 Type 2 diabetes mellitus with other diabetic ophthalmic complication: Secondary | ICD-10-CM | POA: Insufficient documentation

## 2012-10-23 DIAGNOSIS — Z79899 Other long term (current) drug therapy: Secondary | ICD-10-CM | POA: Insufficient documentation

## 2012-10-23 DIAGNOSIS — E785 Hyperlipidemia, unspecified: Secondary | ICD-10-CM | POA: Insufficient documentation

## 2012-10-23 DIAGNOSIS — R21 Rash and other nonspecific skin eruption: Secondary | ICD-10-CM | POA: Insufficient documentation

## 2012-10-23 DIAGNOSIS — Z8543 Personal history of malignant neoplasm of ovary: Secondary | ICD-10-CM | POA: Insufficient documentation

## 2012-10-23 DIAGNOSIS — N186 End stage renal disease: Secondary | ICD-10-CM | POA: Insufficient documentation

## 2012-10-23 DIAGNOSIS — Z8701 Personal history of pneumonia (recurrent): Secondary | ICD-10-CM | POA: Insufficient documentation

## 2012-10-23 DIAGNOSIS — I120 Hypertensive chronic kidney disease with stage 5 chronic kidney disease or end stage renal disease: Secondary | ICD-10-CM | POA: Insufficient documentation

## 2012-10-23 DIAGNOSIS — Z940 Kidney transplant status: Secondary | ICD-10-CM | POA: Insufficient documentation

## 2012-10-23 MED ORDER — OXYCODONE-ACETAMINOPHEN 5-325 MG PO TABS: 2.0000 | ORAL_TABLET | ORAL | Status: DC | PRN

## 2012-10-23 MED ORDER — OXYCODONE-ACETAMINOPHEN 5-325 MG PO TABS
2.0000 | ORAL_TABLET | Freq: Once | ORAL | Status: AC
  Administered 2012-10-23: 2 via ORAL
  Filled 2012-10-23: qty 2

## 2012-10-23 NOTE — ED Provider Notes (Signed)
History     CSN: [id number]  Arrival date & time 10/23/12  0725   First MD Initiated Contact with Patient 10/23/12 [phone]      Chief Complaint  Patient presents with  . Herpes Zoster    (Consider location/radiation/quality/duration/timing/severity/associated sxs/prior treatment) HPI Comments: Patient is a 37 year old female who was seen in the Emergency Department 2 days ago and diagnosed with Herpes Zoster. Patient would like a refill for her pain medication. Patient reports continued burning pain at the site of the rash. She reports taking all of her prescribed pain medication and continues to have the same pain. She reports taking the Valtrex as directed. Patient denies any changes to the rash and any ocular involvement. No aggravating/alleviating factors.      Past Medical History  Diagnosis Date  . ESRD (end stage renal disease)     history of peritoneal and Hemodialysis, now s/p renal  transplant  . Renal transplant disorder 2010  . Ovarian tumor, malignant 2005    resected in [place]  . Diabetic retinopathy(362.0)   . HTN (hypertension)     not well controlled  . 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 HLD (hyperlipidemia)   . CAP (community acquired pneumonia) 2010  . Diabetes mellitus     Past Surgical History  Procedure Date  . Tumor removal 2005    Ovarian, resected in [place]  . Av fistula placement   . Eye surgery     right eye   . Kidney transplant 2010    at [facility]  . Av fistula placement 06/03/2012    Procedure: INSERTION OF ARTERIOVENOUS (AV) GORE-TEX GRAFT ARM;  Surgeon: Larina Earthly, MD;  Location: [facility] OR;  Service: Vascular;  Laterality: Left;    Family History  Problem Relation Age of Onset  . Diabetes Mother   . Liver cancer Father   . Cancer Father     History  Substance Use Topics  . Smoking status: Never Smoker   . Smokeless tobacco: Never Used    . Alcohol Use: No    OB History    Grav Para Term Preterm Abortions TAB SAB Ect Mult Living                  Review of Systems  Skin: Positive for rash.  All other systems reviewed and are negative.    Allergies  Ace inhibitors  Home Medications   Current Outpatient Rx  Name  Route  Sig  Dispense  Refill  . AMLODIPINE BESYLATE 10 MG PO TABS   Oral   Take 10 mg by mouth daily with breakfast.          . ASPIRIN EC 81 MG PO TBEC   Oral   Take 81 mg by mouth daily.         . FUROSEMIDE 40 MG PO TABS   Oral   Take 1 tablet (40 mg total) by mouth 2 (two) times daily.   60 tablet   0   . HYDROCODONE-ACETAMINOPHEN 5-325 MG PO TABS   Oral   Take 1 tablet by mouth every 6 (six) hours as needed for pain.   15 tablet   0   . INSULIN GLARGINE 100 UNIT/ML [place] SOLN   Subcutaneous   Inject 63 Units into the skin at bedtime.          [name] LABETALOL  HCL 100 MG PO TABS   Oral   Take 200 mg by mouth 2 (two) times daily.          [name] METOCLOPRAMIDE HCL 5 MG PO TABS   Oral   Take 5 mg by mouth 4 (four) times daily.         [name] RENA-VITE PO TABS   Oral   Take 1 tablet by mouth daily.         [name] PREDNISONE 5 MG PO TABS   Oral   Take 5 mg by mouth daily.          [name] SIMVASTATIN 20 MG PO TABS   Oral   Take 20 mg by mouth every Monday, Wednesday, and Friday.         [name] TACROLIMUS 1 MG PO CAPS   Oral   Take 2 mg by mouth 2 (two) times daily. 3 capsules in the morning 2 capsules at night         . VALACYCLOVIR HCL 500 MG PO TABS   Oral   Take 1 tablet (500 mg total) by mouth daily.   7 tablet   0     BP 141/96  Pulse 88  Temp 97.9 F (36.6 C) (Oral)  Resp 20  Ht 5\' 7"  (1.702 m)  Wt 178 lb (80.74 kg)  BMI 27.88 kg/m2  SpO2 99%  LMP 05/23/2012  Physical Exam  Nursing note and vitals reviewed. Constitutional: She is oriented to person, place, and time. She appears well-developed and well-nourished. No distress.  HENT:  Head: Normocephalic and  atraumatic.  Mouth/Throat: Oropharynx is clear and moist. No oropharyngeal exudate.  Eyes: Conjunctivae normal are normal.  Neck: Normal range of motion.  Cardiovascular: Normal rate and regular rhythm.  Exam reveals no gallop and no friction rub.   No murmur heard. Pulmonary/Chest: Effort normal and breath sounds normal. She has no wheezes. She has no rales. She exhibits no tenderness.  Abdominal: Soft. There is no tenderness.  Musculoskeletal: Normal range of motion.  Neurological: She is alert and oriented to person, place, and time.       Speech is goal-oriented. Moves limbs without ataxia.   Skin: Skin is warm and dry.       Herpetic rash located on her left upper back and shoulder area that radiates around to her left upper chest.   Psychiatric: She has a normal mood and affect. Her behavior is normal.    ED Course  Procedures (including critical care time)  Labs Reviewed - No data to display No results found.   1. Herpes zoster       MDM  7:43 AM Patient is here for continued pain from her Zoster rash. Patient reports no change in the rash since her last visit. No ocular or facial involvement. Rash does not cross midline and appears herpetic. Patient will have percocet here and I will discharge her with prescription for Percocet.         Emilia Beck, [place] 10/23/12 [phone]

## 2012-10-23 NOTE — ED Provider Notes (Signed)
Medical screening examination/treatment/procedure(s) were performed by non-physician practitioner and as supervising physician I was immediately available for consultation/collaboration.   Glynn Octave, MD 10/23/12 1525

## 2012-10-23 NOTE — ED Notes (Signed)
Patient states she ran out of pain medication and needs to get refill.

## 2012-10-26 ENCOUNTER — Emergency Department (HOSPITAL_COMMUNITY)
Admission: EM | Admit: 2012-10-26 | Discharge: 2012-10-26 | Disposition: A | Discharge status: Home / Self Care | Payer: Medicare Other | Attending: Emergency Medicine | Admitting: Emergency Medicine

## 2012-10-26 DIAGNOSIS — E785 Hyperlipidemia, unspecified: Secondary | ICD-10-CM | POA: Insufficient documentation

## 2012-10-26 DIAGNOSIS — IMO0002 Reserved for concepts with insufficient information to code with codable children: Secondary | ICD-10-CM | POA: Insufficient documentation

## 2012-10-26 DIAGNOSIS — E11319 Type 2 diabetes mellitus with unspecified diabetic retinopathy without macular edema: Secondary | ICD-10-CM | POA: Insufficient documentation

## 2012-10-26 DIAGNOSIS — Z8619 Personal history of other infectious and parasitic diseases: Secondary | ICD-10-CM | POA: Insufficient documentation

## 2012-10-26 DIAGNOSIS — Z8611 Personal history of tuberculosis: Secondary | ICD-10-CM | POA: Insufficient documentation

## 2012-10-26 DIAGNOSIS — E1139 Type 2 diabetes mellitus with other diabetic ophthalmic complication: Secondary | ICD-10-CM | POA: Insufficient documentation

## 2012-10-26 DIAGNOSIS — Z8701 Personal history of pneumonia (recurrent): Secondary | ICD-10-CM | POA: Insufficient documentation

## 2012-10-26 DIAGNOSIS — Z8543 Personal history of malignant neoplasm of ovary: Secondary | ICD-10-CM | POA: Insufficient documentation

## 2012-10-26 DIAGNOSIS — Z7982 Long term (current) use of aspirin: Secondary | ICD-10-CM | POA: Insufficient documentation

## 2012-10-26 DIAGNOSIS — I120 Hypertensive chronic kidney disease with stage 5 chronic kidney disease or end stage renal disease: Secondary | ICD-10-CM | POA: Insufficient documentation

## 2012-10-26 DIAGNOSIS — B029 Zoster without complications: Secondary | ICD-10-CM | POA: Insufficient documentation

## 2012-10-26 DIAGNOSIS — Z992 Dependence on renal dialysis: Secondary | ICD-10-CM | POA: Insufficient documentation

## 2012-10-26 DIAGNOSIS — Z794 Long term (current) use of insulin: Secondary | ICD-10-CM | POA: Insufficient documentation

## 2012-10-26 DIAGNOSIS — N186 End stage renal disease: Secondary | ICD-10-CM | POA: Insufficient documentation

## 2012-10-26 DIAGNOSIS — Z79899 Other long term (current) drug therapy: Secondary | ICD-10-CM | POA: Insufficient documentation

## 2012-10-26 MED ORDER — OXYCODONE-ACETAMINOPHEN 5-325 MG PO TABS: 2.0000 | ORAL_TABLET | ORAL | Status: DC | PRN

## 2012-10-26 NOTE — ED Provider Notes (Signed)
History     CSN: [id number]  Arrival date & time 10/26/12  2147   First MD Initiated Contact with Patient 10/26/12 2237      Chief Complaint  Patient presents with  . Herpes Zoster    (Consider location/radiation/quality/duration/timing/severity/associated sxs/prior treatment)   This 37 year old female has a recent diagnosis several days ago of shingles with continued painful rash despite having run out of narcotics, she has been taking her antiviral medication and finished that, she also takes Benadryl for the itching. Her pain is severe. She has no fever generalized rash confusion shortness breath abdominal pain vomiting or other concerns. Treatment prior to arrival consisted of recent antiviral therapy Benadryl and Percocet.  Past Medical History  Diagnosis Date  . ESRD (end stage renal disease)     history of peritoneal and Hemodialysis, now s/p renal  transplant  . Renal transplant disorder 2010  . Ovarian tumor, malignant 2005    resected in [place]  . Diabetic retinopathy(362.0)   . HTN (hypertension)     not well controlled  . 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 HLD (hyperlipidemia)   . CAP (community acquired pneumonia) 2010  . Diabetes mellitus     Past Surgical History  Procedure Date  . Tumor removal 2005    Ovarian, resected in [place]  . Av fistula placement   . Eye surgery     right eye   . Kidney transplant 2010    at [facility]  . Av fistula placement 06/03/2012    Procedure: INSERTION OF ARTERIOVENOUS (AV) GORE-TEX GRAFT ARM;  Surgeon: Larina Earthly, MD;  Location: [facility] OR;  Service: Vascular;  Laterality: Left;    Family History  Problem Relation Age of Onset  . Diabetes Mother   . Liver cancer Father   . Cancer Father     History  Substance Use Topics  . Smoking status: Never Smoker   . Smokeless tobacco: Never Used  . Alcohol Use: No    OB History      Grav Para Term Preterm Abortions TAB SAB Ect Mult Living                  Review of Systems 10 Systems reviewed and are negative for acute change except as noted in the HPI.  Allergies  Ace inhibitors  Home Medications   Current Outpatient Rx  Name  Route  Sig  Dispense  Refill  . AMLODIPINE BESYLATE 10 MG PO TABS   Oral   Take 10 mg by mouth daily with breakfast.          . ASPIRIN EC 81 MG PO TBEC   Oral   Take 81 mg by mouth daily.         . FUROSEMIDE 40 MG PO TABS   Oral   Take 1 tablet (40 mg total) by mouth 2 (two) times daily.   60 tablet   0   . HYDROCODONE-ACETAMINOPHEN 5-325 MG PO TABS   Oral   Take 1 tablet by mouth every 6 (six) hours as needed for pain.   15 tablet   0   . INSULIN GLARGINE 100 UNIT/ML [place] SOLN   Subcutaneous   Inject 63 Units into the skin at bedtime.          [name] LABETALOL HCL 100 MG PO TABS   Oral   Take 200 mg  by mouth 2 (two) times daily.          [name] METOCLOPRAMIDE HCL 5 MG PO TABS   Oral   Take 5 mg by mouth 4 (four) times daily.         [name] RENA-VITE PO TABS   Oral   Take 1 tablet by mouth daily.         . OXYCODONE-ACETAMINOPHEN 5-325 MG PO TABS   Oral   Take 2 tablets by mouth every 4 (four) hours as needed for pain.   15 tablet   0   . PREDNISONE 5 MG PO TABS   Oral   Take 5 mg by mouth daily.          [name] SIMVASTATIN 20 MG PO TABS   Oral   Take 20 mg by mouth every Monday, Wednesday, and Friday.         [name] TACROLIMUS 1 MG PO CAPS   Oral   Take 2 mg by mouth 2 (two) times daily. 3 capsules in the morning 2 capsules at night         . OXYCODONE-ACETAMINOPHEN 5-325 MG PO TABS   Oral   Take 2 tablets by mouth every 4 (four) hours as needed for pain.   20 tablet   0     BP 164/67  Pulse 104  Temp 99.2 F (37.3 C) (Oral)  Resp 18  SpO2 95%  LMP 05/23/2012  Physical Exam  Nursing note and vitals reviewed. Constitutional:       Pt in pain  HENT:  Head: Normocephalic and atraumatic.   Right Ear: External ear normal.  Left Ear: External ear normal.  Eyes: Conjunctivae normal and EOM are normal. Pupils are equal, round, and reactive to light. Right eye exhibits no discharge. Left eye exhibits no discharge.  Neck: Normal range of motion. Neck supple.  Cardiovascular: Normal rate, regular rhythm and normal heart sounds.   No murmur heard. Pulmonary/Chest: Effort normal and breath sounds normal. No stridor. No respiratory distress. She has no wheezes. She has no rales. She exhibits no tenderness.  Abdominal: Soft. Bowel sounds are normal. There is no tenderness. There is no rebound.  Musculoskeletal: Normal range of motion. She exhibits no tenderness.       Baseline ROM, no obvious new focal weakness.  Neurological: She is alert.       Mental status and motor strength appears baseline for patient and situation.  Skin: Skin is warm and dry. Rash noted. Rash is vesicular.          Localized tender dermatomal zoster rash on left shoulder and neck area without secondary cellulitis or generalized rash and without petechiae or purpura  Psychiatric: She has a normal mood and affect.    ED Course  Procedures (including critical care time)  Labs Reviewed - No data to display No results found.   1. Zoster       MDM  Pt stable in ED with no significant deterioration in condition. Patient / Family / Caregiver informed of clinical course, understand medical decision-making process, and agree with plan.  I doubt any other EMC precluding discharge at this time including, but not necessarily limited to the following: Generalized zoster.          Hurman Horn, MD 11/02/12 [phone]

## 2012-10-26 NOTE — ED Notes (Signed)
The pt has been treated here many times since being diagnosed with shingles.  She is here for pain meds

## 2012-12-17 NOTE — Addendum Note (Signed)
Addendum created 12/17/12 1009 by Eilene Ghazi, MD   Modules edited: Anesthesia Responsible Staff

## 2013-01-25 ENCOUNTER — Encounter (HOSPITAL_COMMUNITY): Payer: Medicare Other

## 2013-01-26 ENCOUNTER — Other Ambulatory Visit (HOSPITAL_COMMUNITY): Payer: Self-pay | Admitting: *Deleted

## 2013-01-26 ENCOUNTER — Encounter (HOSPITAL_COMMUNITY): Payer: Medicare Other

## 2013-01-27 ENCOUNTER — Inpatient Hospital Stay (HOSPITAL_COMMUNITY): Payer: Medicare Other

## 2013-01-27 ENCOUNTER — Inpatient Hospital Stay (HOSPITAL_COMMUNITY)
Admission: AD | Admit: 2013-01-27 | Discharge: 2013-01-28 | DRG: 640 | Disposition: A | Discharge status: Home / Self Care | Payer: Medicare Other | Source: Ambulatory Visit | Attending: Nephrology | Admitting: Nephrology

## 2013-01-27 ENCOUNTER — Other Ambulatory Visit: Payer: Self-pay | Admitting: Nephrology

## 2013-01-27 ENCOUNTER — Encounter (HOSPITAL_COMMUNITY): Payer: Self-pay | Admitting: Nephrology

## 2013-01-27 ENCOUNTER — Encounter (HOSPITAL_COMMUNITY)
Admission: RE | Admit: 2013-01-27 | Discharge: 2013-01-27 | Disposition: A | Discharge status: Home / Self Care | Payer: Medicare Other | Source: Ambulatory Visit | Attending: Nephrology | Admitting: Nephrology

## 2013-01-27 DIAGNOSIS — D631 Anemia in chronic kidney disease: Secondary | ICD-10-CM | POA: Diagnosis present

## 2013-01-27 DIAGNOSIS — D649 Anemia, unspecified: Secondary | ICD-10-CM

## 2013-01-27 DIAGNOSIS — E11319 Type 2 diabetes mellitus with unspecified diabetic retinopathy without macular edema: Secondary | ICD-10-CM | POA: Diagnosis present

## 2013-01-27 DIAGNOSIS — E119 Type 2 diabetes mellitus without complications: Secondary | ICD-10-CM

## 2013-01-27 DIAGNOSIS — Z8611 Personal history of tuberculosis: Secondary | ICD-10-CM

## 2013-01-27 DIAGNOSIS — K861 Other chronic pancreatitis: Secondary | ICD-10-CM | POA: Diagnosis present

## 2013-01-27 DIAGNOSIS — E785 Hyperlipidemia, unspecified: Secondary | ICD-10-CM

## 2013-01-27 DIAGNOSIS — R0602 Shortness of breath: Secondary | ICD-10-CM

## 2013-01-27 DIAGNOSIS — Z9119 Patient's noncompliance with other medical treatment and regimen: Secondary | ICD-10-CM

## 2013-01-27 DIAGNOSIS — Z91199 Patient's noncompliance with other medical treatment and regimen due to unspecified reason: Secondary | ICD-10-CM

## 2013-01-27 DIAGNOSIS — N039 Chronic nephritic syndrome with unspecified morphologic changes: Secondary | ICD-10-CM | POA: Diagnosis present

## 2013-01-27 DIAGNOSIS — N186 End stage renal disease: Secondary | ICD-10-CM | POA: Diagnosis present

## 2013-01-27 DIAGNOSIS — N2581 Secondary hyperparathyroidism of renal origin: Secondary | ICD-10-CM | POA: Diagnosis present

## 2013-01-27 DIAGNOSIS — I120 Hypertensive chronic kidney disease with stage 5 chronic kidney disease or end stage renal disease: Secondary | ICD-10-CM | POA: Diagnosis present

## 2013-01-27 DIAGNOSIS — I10 Essential (primary) hypertension: Secondary | ICD-10-CM

## 2013-01-27 DIAGNOSIS — E8779 Other fluid overload: Principal | ICD-10-CM | POA: Diagnosis present

## 2013-01-27 DIAGNOSIS — E1039 Type 1 diabetes mellitus with other diabetic ophthalmic complication: Secondary | ICD-10-CM | POA: Diagnosis present

## 2013-01-27 DIAGNOSIS — Z940 Kidney transplant status: Secondary | ICD-10-CM

## 2013-01-27 DIAGNOSIS — Z992 Dependence on renal dialysis: Secondary | ICD-10-CM

## 2013-01-27 DIAGNOSIS — E875 Hyperkalemia: Secondary | ICD-10-CM | POA: Diagnosis present

## 2013-01-27 HISTORY — DX: Personal history of other medical treatment: Z92.89

## 2013-01-27 HISTORY — DX: Anemia, unspecified: D64.9

## 2013-01-27 HISTORY — DX: Type 2 diabetes mellitus without complications: E11.9

## 2013-01-27 LAB — CBC
HCT: 20.8 % — ABNORMAL LOW (ref 36.0–46.0)
Hemoglobin: 7.1 g/dL — ABNORMAL LOW (ref 12.0–15.0)
Hemoglobin: 8.7 g/dL — ABNORMAL LOW (ref 12.0–15.0)
MCH: 29.8 pg (ref 26.0–34.0)
MCH: 30 pg (ref 26.0–34.0)
MCHC: 34.1 g/dL (ref 30.0–36.0)
MCHC: 34.3 g/dL (ref 30.0–36.0)
MCV: 87.4 fL (ref 78.0–100.0)
MCV: 87.6 fL (ref 78.0–100.0)
Platelets: 271 10*3/uL (ref 150–400)
RBC: 2.38 MIL/uL — ABNORMAL LOW (ref 3.87–5.11)
RBC: 2.9 MIL/uL — ABNORMAL LOW (ref 3.87–5.11)
RDW: 15.8 % — ABNORMAL HIGH (ref 11.5–15.5)
WBC: 7.6 10*3/uL (ref 4.0–10.5)

## 2013-01-27 LAB — RENAL FUNCTION PANEL
Albumin: 2.7 g/dL — ABNORMAL LOW (ref 3.5–5.2)
BUN: 40 mg/dL — ABNORMAL HIGH (ref 6–23)
CO2: 28 mEq/L (ref 19–32)
Calcium: 8.9 mg/dL (ref 8.4–10.5)
Chloride: 86 mEq/L — ABNORMAL LOW (ref 96–112)
Creatinine, Ser: 7.19 mg/dL — ABNORMAL HIGH (ref 0.50–1.10)
GFR calc Af Amer: 8 mL/min — ABNORMAL LOW (ref >90)
GFR calc non Af Amer: 7 mL/min — ABNORMAL LOW (ref >90)
Glucose, Bld: 70 mg/dL (ref 70–99)
Phosphorus: 9.3 mg/dL — ABNORMAL HIGH (ref 2.3–4.6)
Potassium: 5.3 mEq/L — ABNORMAL HIGH (ref 3.5–5.1)
Sodium: 127 mEq/L — ABNORMAL LOW (ref 135–145)

## 2013-01-27 LAB — GLUCOSE, CAPILLARY: Glucose-Capillary: 71 mg/dL (ref 70–99)

## 2013-01-27 MED ORDER — HYDROXYZINE HCL 25 MG PO TABS
25.0000 mg | ORAL_TABLET | Freq: Three times a day (TID) | ORAL | Status: DC | PRN
  Filled 2013-01-27: qty 1

## 2013-01-27 MED ORDER — ACETAMINOPHEN 325 MG PO TABS: 650.0000 mg | ORAL_TABLET | Freq: Four times a day (QID) | ORAL | Status: DC | PRN

## 2013-01-27 MED ORDER — SORBITOL 70 % SOLN
30.0000 mL | Status: DC | PRN
  Filled 2013-01-27: qty 30

## 2013-01-27 MED ORDER — SODIUM CHLORIDE 0.9 % IV SOLN: 100.0000 mL | INTRAVENOUS | Status: DC | PRN

## 2013-01-27 MED ORDER — PREDNISONE 5 MG PO TABS
5.0000 mg | ORAL_TABLET | Freq: Every day | ORAL | Status: DC
  Administered 2013-01-27 – 2013-01-28 (×2): 5 mg via ORAL
  Filled 2013-01-27 (×2): qty 1

## 2013-01-27 MED ORDER — ACETAMINOPHEN 650 MG RE SUPP: 650.0000 mg | Freq: Four times a day (QID) | RECTAL | Status: DC | PRN

## 2013-01-27 MED ORDER — SODIUM CHLORIDE 0.9 % IJ SOLN
3.0000 mL | Freq: Two times a day (BID) | INTRAMUSCULAR | Status: DC
  Administered 2013-01-28: 3 mL via INTRAVENOUS

## 2013-01-27 MED ORDER — DIPHENHYDRAMINE HCL 25 MG PO TABS
25.0000 mg | ORAL_TABLET | Freq: Once | ORAL | Status: AC
  Administered 2013-01-27: 25 mg via ORAL
  Filled 2013-01-27: qty 1

## 2013-01-27 MED ORDER — HEPARIN SODIUM (PORCINE) 1000 UNIT/ML DIALYSIS
20.0000 [IU]/kg | INTRAMUSCULAR | Status: DC | PRN
  Filled 2013-01-27: qty 2

## 2013-01-27 MED ORDER — ACETAMINOPHEN 325 MG PO TABS
ORAL_TABLET | ORAL | Status: AC
  Filled 2013-01-27: qty 1

## 2013-01-27 MED ORDER — INSULIN GLARGINE 100 UNIT/ML ~~LOC~~ SOLN
63.0000 [IU] | Freq: Every day | SUBCUTANEOUS | Status: DC
  Administered 2013-01-27: 63 [IU] via SUBCUTANEOUS
  Filled 2013-01-27 (×2): qty 0.63

## 2013-01-27 MED ORDER — LABETALOL HCL 200 MG PO TABS
200.0000 mg | ORAL_TABLET | Freq: Two times a day (BID) | ORAL | Status: DC
  Administered 2013-01-27 – 2013-01-28 (×2): 200 mg via ORAL
  Filled 2013-01-27 (×3): qty 1

## 2013-01-27 MED ORDER — FUROSEMIDE 40 MG PO TABS
40.0000 mg | ORAL_TABLET | Freq: Two times a day (BID) | ORAL | Status: DC
  Administered 2013-01-28 (×2): 40 mg via ORAL
  Filled 2013-01-27 (×3): qty 1

## 2013-01-27 MED ORDER — ALTEPLASE 2 MG IJ SOLR
2.0000 mg | Freq: Once | INTRAMUSCULAR | Status: DC | PRN
  Filled 2013-01-27: qty 2

## 2013-01-27 MED ORDER — ONDANSETRON HCL 4 MG PO TABS: 4.0000 mg | ORAL_TABLET | Freq: Four times a day (QID) | ORAL | Status: DC | PRN

## 2013-01-27 MED ORDER — NEPRO/CARBSTEADY PO LIQD
237.0000 mL | ORAL | Status: DC | PRN
  Filled 2013-01-27: qty 237

## 2013-01-27 MED ORDER — SODIUM CHLORIDE 0.9 % IJ SOLN: 3.0000 mL | INTRAMUSCULAR | Status: DC | PRN

## 2013-01-27 MED ORDER — SODIUM CHLORIDE 0.9 % IV SOLN: 250.0000 mL | INTRAVENOUS | Status: DC | PRN

## 2013-01-27 MED ORDER — LIDOCAINE HCL (PF) 1 % IJ SOLN
5.0000 mL | INTRAMUSCULAR | Status: DC | PRN
  Filled 2013-01-27: qty 5

## 2013-01-27 MED ORDER — METOCLOPRAMIDE HCL 5 MG PO TABS
5.0000 mg | ORAL_TABLET | Freq: Four times a day (QID) | ORAL | Status: DC
  Administered 2013-01-28 (×3): 5 mg via ORAL
  Filled 2013-01-27 (×5): qty 1

## 2013-01-27 MED ORDER — CALCIUM CARBONATE 1250 MG/5ML PO SUSP
500.0000 mg | Freq: Four times a day (QID) | ORAL | Status: DC | PRN
  Filled 2013-01-27: qty 5

## 2013-01-27 MED ORDER — PENTAFLUOROPROP-TETRAFLUOROETH EX AERO: 1.0000 "application " | INHALATION_SPRAY | CUTANEOUS | Status: DC | PRN

## 2013-01-27 MED ORDER — INSULIN ASPART 100 UNIT/ML ~~LOC~~ SOLN
0.0000 [IU] | Freq: Three times a day (TID) | SUBCUTANEOUS | Status: DC
  Administered 2013-01-28: 2 [IU] via SUBCUTANEOUS

## 2013-01-27 MED ORDER — LIDOCAINE-PRILOCAINE 2.5-2.5 % EX CREA
1.0000 "application " | TOPICAL_CREAM | CUTANEOUS | Status: DC | PRN
  Filled 2013-01-27: qty 5

## 2013-01-27 MED ORDER — ASPIRIN EC 81 MG PO TBEC
81.0000 mg | DELAYED_RELEASE_TABLET | Freq: Every day | ORAL | Status: DC
  Administered 2013-01-27 – 2013-01-28 (×2): 81 mg via ORAL
  Filled 2013-01-27 (×2): qty 1

## 2013-01-27 MED ORDER — SIMVASTATIN 20 MG PO TABS: 20.0000 mg | ORAL_TABLET | ORAL | Status: DC

## 2013-01-27 MED ORDER — ZOLPIDEM TARTRATE 5 MG PO TABS: 5.0000 mg | ORAL_TABLET | Freq: Every evening | ORAL | Status: DC | PRN

## 2013-01-27 MED ORDER — HEPARIN SODIUM (PORCINE) 5000 UNIT/ML IJ SOLN
5000.0000 [IU] | Freq: Two times a day (BID) | INTRAMUSCULAR | Status: DC
  Administered 2013-01-28 (×2): 5000 [IU] via SUBCUTANEOUS
  Filled 2013-01-27 (×2): qty 1

## 2013-01-27 MED ORDER — NEPRO/CARBSTEADY PO LIQD
237.0000 mL | Freq: Three times a day (TID) | ORAL | Status: DC | PRN
  Filled 2013-01-27: qty 237

## 2013-01-27 MED ORDER — BISACODYL 5 MG PO TBEC: 5.0000 mg | DELAYED_RELEASE_TABLET | Freq: Every day | ORAL | Status: DC | PRN

## 2013-01-27 MED ORDER — OXYCODONE-ACETAMINOPHEN 5-325 MG PO TABS: 2.0000 | ORAL_TABLET | ORAL | Status: DC | PRN

## 2013-01-27 MED ORDER — HEPARIN SODIUM (PORCINE) 1000 UNIT/ML DIALYSIS
1000.0000 [IU] | INTRAMUSCULAR | Status: DC | PRN
  Filled 2013-01-27: qty 1

## 2013-01-27 MED ORDER — CAMPHOR-MENTHOL 0.5-0.5 % EX LOTN
1.0000 "application " | TOPICAL_LOTION | Freq: Three times a day (TID) | CUTANEOUS | Status: DC | PRN
  Filled 2013-01-27: qty 222

## 2013-01-27 MED ORDER — SODIUM CHLORIDE 0.9 % IJ SOLN
3.0000 mL | Freq: Two times a day (BID) | INTRAMUSCULAR | Status: DC
  Administered 2013-01-28 (×2): 3 mL via INTRAVENOUS

## 2013-01-27 MED ORDER — TACROLIMUS 1 MG PO CAPS
2.0000 mg | ORAL_CAPSULE | Freq: Every day | ORAL | Status: DC
  Administered 2013-01-27: 2 mg via ORAL
  Filled 2013-01-27 (×2): qty 2

## 2013-01-27 MED ORDER — AMLODIPINE BESYLATE 10 MG PO TABS
10.0000 mg | ORAL_TABLET | Freq: Every day | ORAL | Status: DC
  Administered 2013-01-28: 10 mg via ORAL
  Filled 2013-01-27 (×2): qty 1

## 2013-01-27 MED ORDER — DOCUSATE SODIUM 283 MG RE ENEM
1.0000 | ENEMA | RECTAL | Status: DC | PRN
  Filled 2013-01-27: qty 1

## 2013-01-27 MED ORDER — ONDANSETRON HCL 4 MG/2ML IJ SOLN: 4.0000 mg | Freq: Four times a day (QID) | INTRAMUSCULAR | Status: DC | PRN

## 2013-01-27 MED ORDER — ACETAMINOPHEN 325 MG PO TABS
325.0000 mg | ORAL_TABLET | Freq: Once | ORAL | Status: AC
  Administered 2013-01-27: 325 mg via ORAL

## 2013-01-27 MED ORDER — RENA-VITE PO TABS
1.0000 | ORAL_TABLET | Freq: Every day | ORAL | Status: DC
  Administered 2013-01-28: 1 via ORAL
  Filled 2013-01-27 (×2): qty 1

## 2013-01-27 MED ORDER — TACROLIMUS 1 MG PO CAPS
3.0000 mg | ORAL_CAPSULE | Freq: Every morning | ORAL | Status: DC
  Administered 2013-01-28: 3 mg via ORAL
  Filled 2013-01-27: qty 3

## 2013-01-27 NOTE — Progress Notes (Signed)
Patient to dialysis.

## 2013-01-27 NOTE — Progress Notes (Addendum)
1250 patient c/o shortness of breath.  02 sat 94%.  Placed on 02 @ 2 liters per nasal canula.  1300 sitting up in bed .  States is breathing better.  Cannot lie down to breathe.  Cough productive of white sputum. 1305 Theodosia Quay, PA advised.  She will come to see patien

## 2013-01-27 NOTE — H&P (Signed)
[name] is an 37 y.o. female.  Chief Complaint: SOB during transfusion in short stay HPI: 37 yo Asian female with hx of DM, HTN and ESRD, she has had PD , HD and kidney-pancreas transplant in 2010.  The kidney failed and she went back on HD Oct 2013. The pancreas failed as well, back on insulin.  The prograf and pred are being tapered. Patient had anemia with EPO resistance and max EPO at center. She was sent to Short Stay for blood transfusion for Hb of 6.4 on 4/17. She had refused transfusion initially on 4/18.  She's had negative stool cards, hematology eval at [facility] .  Her tsat was 18% on 4/17.      ROS  no cp, fever  no ha or visual chg  no skin rash or itching   Past Medical History:  Past Medical History  Diagnosis Date  . ESRD (end stage renal disease)     history of peritoneal and Hemodialysis, now s/p renal  transplant  . Renal transplant disorder 2010  . Ovarian tumor, malignant 2005    resected in [place]  . Diabetic retinopathy(362.0)   . HTN (hypertension)     not well controlled  . 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 HLD (hyperlipidemia)   . CAP (community acquired pneumonia) 2010  . Diabetes mellitus     Past Surgical History:  Past Surgical History  Procedure Laterality Date  . Tumor removal  2005    Ovarian, resected in [place]  . Av fistula placement    . Eye surgery      right eye   . Kidney transplant  2010    at [facility]  . Av fistula placement  06/03/2012    Procedure: INSERTION OF ARTERIOVENOUS (AV) GORE-TEX GRAFT ARM;  Surgeon: Larina Earthly, MD;  Location: [facility] OR;  Service: Vascular;  Laterality: Left;    Family History:  Family History  Problem Relation Age of Onset  . Diabetes Mother   . Liver cancer Father   . Cancer Father    Social History:  reports that she has never smoked. She has never used smokeless tobacco. She reports that she does not  drink alcohol or use illicit drugs.  Allergies:  Allergies  Allergen Reactions  . Ace Inhibitors Other (See Comments)     cause cough    Home medications: Prior to Admission medications   Medication Sig Start Date End Date Taking? Authorizing Provider  amLODipine (NORVASC) 10 MG tablet Take 10 mg by mouth daily with breakfast.     Historical Provider, MD  aspirin EC 81 MG tablet Take 81 mg by mouth daily.    Historical Provider, MD  furosemide (LASIX) 40 MG tablet Take 1 tablet (40 mg total) by mouth 2 (two) times daily. 06/12/12   Cristal Ford, MD  HYDROcodone-acetaminophen (NORCO/VICODIN) 5-325 MG per tablet Take 1 tablet by mouth every 6 (six) hours as needed for pain. 10/21/12   Fayrene Helper, PA-C  insulin glargine (LANTUS) 100 UNIT/ML injection Inject 63 Units into the skin at bedtime.     Historical Provider, MD  labetalol (NORMODYNE) 100 MG tablet Take 200 mg by mouth 2 (two) times daily.     Historical Provider, MD  metoCLOPramide (REGLAN) 5 MG tablet Take 5 mg by mouth 4 (four) times daily.    Historical Provider, MD  multivitamin (RENA-VIT) TABS  tablet Take 1 tablet by mouth daily.    Historical Provider, MD  oxyCODONE-acetaminophen (PERCOCET) 5-325 MG per tablet Take 2 tablets by mouth every 4 (four) hours as needed for pain. 10/26/12   Hurman Horn, MD  oxyCODONE-acetaminophen (PERCOCET/ROXICET) 5-325 MG per tablet Take 2 tablets by mouth every 4 (four) hours as needed for pain. 10/23/12   Kaitlyn Szekalski, PA-C  predniSONE (DELTASONE) 5 MG tablet Take 5 mg by mouth daily.     Historical Provider, MD  simvastatin (ZOCOR) 20 MG tablet Take 20 mg by mouth every Monday, Wednesday, and Friday.    Historical Provider, MD  tacrolimus (PROGRAF) 1 MG capsule Take 2 mg by mouth 2 (two) times daily. 3 capsules in the morning 2 capsules at night    Historical Provider, MD    Labs: Basic Metabolic Panel: No results found for this basename: NA, K, CL, CO2, GLUCOSE, BUN, CREATININE, ALB,  CALCIUM, PHOS,  in the last 168 hours Liver Function Tests: No results found for this basename: AST, ALT, ALKPHOS, BILITOT, PROT, ALBUMIN,  in the last 168 hours No results found for this basename: LIPASE, AMYLASE,  in the last 168 hours No results found for this basename: AMMONIA,  in the last 168 hours CBC: No results found for this basename: WBC, NEUTROABS, HGB, HCT, MCV, PLT,  in the last 168 hours PT/INR: @LABRCNTIP (inr:5) Cardiac Enzymes: )No results found for this basename: CKTOTAL, CKMB, CKMBINDEX, TROPONINI,  in the last 168 hours CBG: No results found for this basename: GLUCAP,  in the last 168 hours   Physical Exam:  There were no vitals taken for this visit.  Gen: Adult Asian female, no distress, some periorbital edema HEENT:  EOMI, sclera anicteric, throat clear Neck: no JVD, no LAN Chest: crackles R base, clear on left CV: regular, no rub or gallop, no murmur, no carotid or femoral bruits, pedal pulses intact Abdomen: soft, nontender, obese, nondistended Ext: trace LE edema bilat, no joint effusion or deformity, no gangrene or ulceration Neuro: alert, Ox3, no focal deficit, no asterixis Access: LUA AVGG with good thrill and bruit  Outpatient HD: TTS [facility]  F180  3.5 hrs  Bath 2K/2.25ca  EDW 77kg  LUA AVGG  EPO 20000  Heparin 2400     Impression/Plan 1. Dyspnea- during blood transfusion in Short Stay today, prob due to volume overload, vs transfusion reaction, PNA, other. Plan CXR, 02, hold abx for now, HD tonight with maximum UF as tolerated.  2. ESRD, usual HD is TTS. HD tonight 3. HTN/volume- takes norvasc 10 and labetalol 200bid at home, continue. Volume overload most likely, cxr pending. She is 4-5 kg over dry weight, has not been getting to dry weight at center. Has SOB from time to time, has refused outpt cxr's in recent past.  Plan HD today 4. Anemia of CKD- transfusing, on max EPO already. Last tsat 18 % on 3/24. Had work-up in fall 2013 with BM Bx ("normal",  per pt) and upper and lower endoscopy.  5. Hx pancreatic transplant 2010- underwent pancreatectomy  12/13/10 for acute and chronic pancreatitis with abcess formation 6. Hx renal transplant 2010- had recurrent rejection episodes and rec'd several doses of pulse steroids, Thymoglobulin and Campath. 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  7. Secondary HPTH- cont phoslo as binder, not on vit D 8. S/P renal transplant- on lowdose of prograf 1mg   bid and pred 5/d, continue to taper 9. DM on lantus insulin   Vinson Moselle  MD [facility] [phone] pgr    [phone] cell 01/27/2013, 3:28 PM

## 2013-01-28 LAB — COMPREHENSIVE METABOLIC PANEL
ALT: 15 U/L (ref 0–35)
Alkaline Phosphatase: 40 U/L (ref 39–117)
BUN: 15 mg/dL (ref 6–23)
CO2: 31 mEq/L (ref 19–32)
Chloride: 96 mEq/L (ref 96–112)
GFR calc Af Amer: 14 mL/min — ABNORMAL LOW (ref >90)
GFR calc non Af Amer: 12 mL/min — ABNORMAL LOW (ref >90)
Glucose, Bld: 109 mg/dL — ABNORMAL HIGH (ref 70–99)
Potassium: 5.6 mEq/L — ABNORMAL HIGH (ref 3.5–5.1)
Sodium: 136 mEq/L (ref 135–145)
Total Bilirubin: 0.3 mg/dL (ref 0.3–1.2)
Total Protein: 7.4 g/dL (ref 6.0–8.3)

## 2013-01-28 LAB — PREPARE RBC (CROSSMATCH)

## 2013-01-28 MED ORDER — HEPARIN SODIUM (PORCINE) 1000 UNIT/ML DIALYSIS: 2400.0000 [IU] | INTRAMUSCULAR | Status: DC | PRN

## 2013-01-28 MED ORDER — HEPARIN SODIUM (PORCINE) 1000 UNIT/ML DIALYSIS: 1000.0000 [IU] | INTRAMUSCULAR | Status: DC | PRN

## 2013-01-28 MED ORDER — SODIUM CHLORIDE 0.9 % IV SOLN: 100.0000 mL | INTRAVENOUS | Status: DC | PRN

## 2013-01-28 MED ORDER — DARBEPOETIN ALFA-POLYSORBATE 150 MCG/0.3ML IJ SOLN
150.0000 ug | INTRAMUSCULAR | Status: DC
  Administered 2013-01-28: 150 ug via INTRAVENOUS

## 2013-01-28 MED ORDER — RENA-VITE PO TABS: 1.0000 | ORAL_TABLET | Freq: Every day | ORAL | Status: DC

## 2013-01-28 MED ORDER — ALTEPLASE 2 MG IJ SOLR: 2.0000 mg | Freq: Once | INTRAMUSCULAR | Status: DC | PRN

## 2013-01-28 MED ORDER — PENTAFLUOROPROP-TETRAFLUOROETH EX AERO: 1.0000 "application " | INHALATION_SPRAY | CUTANEOUS | Status: DC | PRN

## 2013-01-28 MED ORDER — TACROLIMUS 1 MG PO CAPS: 2.0000 mg | ORAL_CAPSULE | Freq: Two times a day (BID) | ORAL | Status: DC

## 2013-01-28 MED ORDER — LIDOCAINE HCL (PF) 1 % IJ SOLN: 5.0000 mL | INTRAMUSCULAR | Status: DC | PRN

## 2013-01-28 MED ORDER — CALCIUM ACETATE 667 MG PO CAPS: 2001.0000 mg | ORAL_CAPSULE | Freq: Three times a day (TID) | ORAL | Status: AC

## 2013-01-28 MED ORDER — DOXERCALCIFEROL 4 MCG/2ML IV SOLN
6.0000 ug | INTRAVENOUS | Status: DC
  Administered 2013-01-28: 6 ug via INTRAVENOUS

## 2013-01-28 MED ORDER — TACROLIMUS 1 MG PO CAPS
2.0000 mg | ORAL_CAPSULE | Freq: Two times a day (BID) | ORAL | Status: DC
  Filled 2013-01-28: qty 2

## 2013-01-28 MED ORDER — CALCIUM ACETATE 667 MG PO CAPS
2001.0000 mg | ORAL_CAPSULE | Freq: Three times a day (TID) | ORAL | Status: DC
  Administered 2013-01-28: 2001 mg via ORAL
  Filled 2013-01-28 (×2): qty 3

## 2013-01-28 MED ORDER — NEPRO/CARBSTEADY PO LIQD: 237.0000 mL | ORAL | Status: DC | PRN

## 2013-01-28 MED ORDER — LIDOCAINE-PRILOCAINE 2.5-2.5 % EX CREA: 1.0000 "application " | TOPICAL_CREAM | CUTANEOUS | Status: DC | PRN

## 2013-01-28 MED ORDER — TACROLIMUS 1 MG PO CAPS: 2.0000 mg | ORAL_CAPSULE | Freq: Every day | ORAL | Status: DC

## 2013-01-28 NOTE — Procedures (Signed)
Patient seen on Hemodialysis. QB 450, UF goal 3.3L, 139/63 Treatment adjusted as needed.  Zetta Bills MD [facility]. Office # [phone] Pager # [phone] 2:08 PM

## 2013-01-28 NOTE — Progress Notes (Signed)
Patient ID: [name], female   DOB: 19-Aug-1976, 37 y.o.   MRN: [id number]   [facility] Progress Note    Subjective:   Reports to be feeling well, no further shortness of breath. Denies any chest pain. She reports that she has not had any problems with her arteriovenous graft at hemodialysis in the recent past.    Objective:   BP 138/60  Pulse 88  Temp(Src) 98.3 F (36.8 C) (Oral)  Resp 18  Wt 72.5 kg (159 lb 13.3 oz)  BMI 25.03 kg/m2  SpO2 94%  Physical Exam: Gen: Comfortably resting in bed CVS: Pulse regular in rate and rhythm, heart sounds S1 and S2 normal Resp: Fine rales right base otherwise clear to auscultation Abd: Soft, obese, nontender without any organomegaly and bowel sounds are normal Ext: Trace lower extremity edema  Labs: BMET  Recent Labs Lab 01/27/13 1600 01/27/13 2128 01/28/13 0550  NA 127*  --  136  K 5.3*  --  5.6*  CL 86*  --  96  CO2 28  --  31  GLUCOSE 70  --  109*  BUN 40*  --  15  CREATININE 7.19* 3.10* 4.39*  CALCIUM 8.9  --  8.6  PHOS 9.3*  --   --    CBC  Recent Labs Lab 01/27/13 1600 01/27/13 2128  WBC 7.6 6.7  HGB 7.1* 8.7*  HCT 20.8* 25.4*  MCV 87.4 87.6  PLT 271 263    @IMGRELPRIORS @ Medications:    . amLODipine  10 mg Oral Q breakfast  . aspirin EC  81 mg Oral Daily  . furosemide  40 mg Oral BID  . heparin  5,000 Units Subcutaneous Q12H  . insulin aspart  0-9 Units Subcutaneous TID WC  . insulin glargine  63 Units Subcutaneous QHS  . labetalol  200 mg Oral BID  . metoCLOPramide  5 mg Oral QID  . multivitamin  1 tablet Oral Daily  . predniSONE  5 mg Oral Daily  . [START ON 01/29/2013] simvastatin  20 mg Oral Q M,W,F-1800  . sodium chloride  3 mL Intravenous Q12H  . sodium chloride  3 mL Intravenous Q12H  . tacrolimus  2 mg Oral QHS  . tacrolimus  3 mg Oral q morning - 10a     Assessment/ Plan:   1. Dyspnea- prob due to volume overload, vs transfusion reaction, improved clinically status post  hemodialysis/ultrafiltration yesterday. We'll repeat hemodialysis with limited ultrafiltration today given hyperkalemia. 2. End-stage renal disease on hemodialysis-usually on a Tuesday, Thursday, Saturday schedule. Plan for hemodialysis today to get her back on schedule as well as mannitol-potassium. 3. Anemia of CKD- ongoing workup of her anemia is an outpatient-question the possibility of chronic inflammatory status from failed renal allograft that may need transplant nephrectomy. Plan for one unit of packed red cell transfusion at dialysis today. 4. Hx pancreatic transplant 2010- underwent pancreatectomy 12/13/10 for acute and chronic pancreatitis with abcess formation 5. Hx renal transplant 2010- ongoing slow taper of immunosuppression.  6. Secondary HPTH- cont phoslo as binder, not on vit D 7. S/P renal transplant- on lowdose of prograf 1mg  bid and pred 5/d, continue to taper 8. DM on lantus insulin    Zetta Bills, MD 01/28/2013, 10:43 AM

## 2013-01-28 NOTE — Discharge Summary (Signed)
Physician Discharge Summary  Patient ID: [name] MRN: [id number] DOB/AGE: 1976/08/29 37 y.o.  Admit date: 01/27/2013 Discharge date: 01/28/2013  Discharge Diagnoses:   1. Dypnea secondary to fluid overload 2. Volume overload excaerbated by blood transfusion 3. Chronic anemia 4. ESRD 5. Hx failed renal transplant 6. 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 Secondary hyperparathyroidism\ 8. Type 1 DM 9. Hypertension  10. Medical noncompliance  Discharged Condition: stable  HPI:  37 yo [place] female with hx of DM, HTN and ESRD, she has had PD , HD and kidney-pancreas transplant in 2010. The kidney failed and she went back on HD Oct 2013. The pancreas failed as well, back on insulin. The prograf and pred are being tapered. Patient had anemia with EPO resistance and max EPO at center. She was sent to Short Stay for blood transfusion for Hb of 6.4 on 4/17 and became short of breath during the transfusion of the first unit. Outpatient records show Hgb has been trending down despite increases in Epogen Review of records at her dialysis center show that she has been complaining of SOB there, but also refusing CXR for evaluation. She had refused transfusion initially on 4/18. She's had negative stool cards, hematology eval at [facility] started recently and is in process . Her tsat was 18% on 4/17. Review of old records show she was seen by Dr. Donnie Coffin last fall with partial anemia work up (neg blood marrow), but failed to follow up with Dr. Cyndie Chime for additional appointments.   Hospital Course:   1. Dypnea secondary to fluid overload - the patient was admitted from short staff after one unit of blood was transfused. Sats done during her initial SOB were 92% and increased to upper 90s with the initiation of O2.  She was dialyzed with a net UF of 4 L and received her second unit of blood during the 4/23 treatment. CXR was  done after the treatment and showed pulmonary vascular congestion and perihilar edema. For this reason and hyperkalemia, she dialyzed again today before discharge.  She had a net UF of 2.5 with her second dialysis treatment and a standing post HD weight of 73.5 kg. Her EDW will be lowered accordingly. 2. Volume overload excaerbated by blood transfusion - as per #1 treated with serial HD treatment with as new EDW as above.. She continues on lasix bid 3.  Anemia of CKD- Hgb increased to 8.7 last pm after 2 units. ongoing workup of her anemia is an outpatient-question the possibility of chronic inflammatory status from failed renal allograft that may need transplant nephrectomy.  She received 3 units total PRBC in the hospital and Aranesp 150 IV on 4/24. She will continue on same Epogen dose at discharge. 4. ESRD - TTS hyperkalemia post HD yesterday; doesn't make sense as Cr and BUN decreased after treatment; should be corrected with HD today; 5. Hx failed renal transplant - continue ongoing slow taper of immunosuppression to be directed by her outpatient nephrologist. 6. 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 Secondary hyperparathyroidism- she continues on hectorol and phoslo 8. Type 1 DM - continues on high dose Lantus and SSI 9. Hypertension - continues on same medications - challenging volume 10. Medical noncompliance - will need follow up by dialysis staff to make sure she follows up with hematology at [facility] for evaluation of her anemia  Treatments: Hemodialysis 4/23 and  4/24 Blood pressure 160/77, pulse 89, temperature 97.6 F (36.4 C), temperature source Oral, resp. rate 22, weight 74.4 kg (164 lb 0.4 oz), SpO2 93.00%.  Disposition: 01-Home or Self Care      Discharge Orders   Future Orders Complete By Expires     Activity as tolerated  As directed     Bring all medications to your doctor's appointment  As  directed     CALL 911 for chest discomfort not relieved by NTG or lasting longer than 20 minutes  As directed     Call doctor for chest discomfort that is more frequent or severe  As directed     Call doctor for fainting or near blackouts  As directed     Call doctor for more than 3-4 kg weight gain between hemodialysis treatments  As directed     Call doctor for shortness of breath, with or without a dry hacking cough  As directed     Call doctor for swellling in the hands, feet or stomach not improved after hemodialysis  As directed     Call doctor if you have to sleep on extra pillows at night in order to breathe  As directed     Do not skip any hemodialysis appointments unless directed by your doctor  As directed     No Wound Care  As directed     Renal diet - Limit fluids to 1200 ml/day  As directed     STOP ANY ACTIVITY THAT CAUSES CHEST PAIN, SHORTNESS OF BREATH, DIZZINESS, SWEATING OR EXCESSIVE WEAKNESS  As directed         Medication List    STOP taking these medications       oxyCODONE-acetaminophen 5-325 MG per tablet  Commonly known as:  PERCOCET      TAKE these medications       amLODipine 10 MG tablet  Commonly known as:  NORVASC  Take 10 mg by mouth daily with breakfast.     aspirin EC 81 MG tablet  Take 81 mg by mouth daily.     calcium acetate 667 MG capsule  Commonly known as:  PHOSLO  Take 3 capsules (2,001 mg total) by mouth 3 (three) times daily with meals.     furosemide 40 MG tablet  Commonly known as:  LASIX  Take 1 tablet (40 mg total) by mouth 2 (two) times daily.     HYDROcodone-acetaminophen 5-325 MG per tablet  Commonly known as:  NORCO/VICODIN  Take 1 tablet by mouth every 6 (six) hours as needed for pain.     insulin glargine 100 UNIT/ML injection  Commonly known as:  LANTUS  Inject 63 Units into the skin at bedtime.     labetalol 100 MG tablet  Commonly known as:  NORMODYNE  Take 200 mg by mouth 2 (two) times daily.      metoCLOPramide 5 MG tablet  Commonly known as:  REGLAN  Take 5 mg by mouth 4 (four) times daily.     multivitamin Tabs tablet  Take 1 tablet by mouth daily.     predniSONE 5 MG tablet  Commonly known as:  DELTASONE  Take 5 mg by mouth daily.     simvastatin 20 MG tablet  Commonly known as:  ZOCOR  Take 20 mg by mouth every Monday, Wednesday, and Friday.     tacrolimus 1 MG capsule  Commonly known as:  PROGRAF  Take 2 capsules (2 mg total) by mouth at bedtime.  tacrolimus 1 MG capsule  Commonly known as:  PROGRAF  Take 2 capsules (2 mg total) by mouth 2 (two) times daily.        Signed: Sheffield Slider, PA-C [facility] [phone] 01/28/2013, 2:44 PM  Attending Physician: Dr. Zetta Bills

## 2013-01-28 NOTE — Progress Notes (Signed)
Discharge patient, discharge instruction given, no questions verbalized

## 2013-01-28 NOTE — Progress Notes (Signed)
MD made aware of K 5.6.

## 2013-01-29 LAB — TYPE AND SCREEN
ABO/RH(D): AB POS
Antibody Screen: NEGATIVE
Unit division: 0

## 2013-01-31 NOTE — Discharge Summary (Signed)
I have personally seen and examined this patient and agree with the assessment/plan as outlined above by [facility] PA. [name] was admitted overnight for dyspnea and hyperkalemia after PRBC transfusion. Discharged after 2 days of tandem dialysis and PRBCs for anemia. Chrystie Hagwood K.,MD 01/31/2013 3:34 PM

## 2013-02-01 ENCOUNTER — Encounter (HOSPITAL_COMMUNITY): Payer: Self-pay | Admitting: *Deleted

## 2013-02-01 ENCOUNTER — Emergency Department (HOSPITAL_COMMUNITY): Payer: Medicare Other

## 2013-02-01 ENCOUNTER — Inpatient Hospital Stay (HOSPITAL_COMMUNITY)
Admission: EM | Admit: 2013-02-01 | Discharge: 2013-02-02 | DRG: 291 | Disposition: A | Discharge status: Home / Self Care | Payer: Medicare Other | Attending: Internal Medicine | Admitting: Internal Medicine

## 2013-02-01 DIAGNOSIS — I10 Essential (primary) hypertension: Secondary | ICD-10-CM | POA: Diagnosis present

## 2013-02-01 DIAGNOSIS — I120 Hypertensive chronic kidney disease with stage 5 chronic kidney disease or end stage renal disease: Secondary | ICD-10-CM | POA: Diagnosis present

## 2013-02-01 DIAGNOSIS — D72829 Elevated white blood cell count, unspecified: Secondary | ICD-10-CM | POA: Diagnosis present

## 2013-02-01 DIAGNOSIS — J811 Chronic pulmonary edema: Secondary | ICD-10-CM

## 2013-02-01 DIAGNOSIS — E11319 Type 2 diabetes mellitus with unspecified diabetic retinopathy without macular edema: Secondary | ICD-10-CM | POA: Diagnosis present

## 2013-02-01 DIAGNOSIS — Z794 Long term (current) use of insulin: Secondary | ICD-10-CM

## 2013-02-01 DIAGNOSIS — E119 Type 2 diabetes mellitus without complications: Secondary | ICD-10-CM | POA: Diagnosis present

## 2013-02-01 DIAGNOSIS — IMO0002 Reserved for concepts with insufficient information to code with codable children: Secondary | ICD-10-CM

## 2013-02-01 DIAGNOSIS — Z9119 Patient's noncompliance with other medical treatment and regimen: Secondary | ICD-10-CM

## 2013-02-01 DIAGNOSIS — E785 Hyperlipidemia, unspecified: Secondary | ICD-10-CM | POA: Diagnosis present

## 2013-02-01 DIAGNOSIS — Z992 Dependence on renal dialysis: Secondary | ICD-10-CM

## 2013-02-01 DIAGNOSIS — R0602 Shortness of breath: Secondary | ICD-10-CM

## 2013-02-01 DIAGNOSIS — Z7982 Long term (current) use of aspirin: Secondary | ICD-10-CM

## 2013-02-01 DIAGNOSIS — E871 Hypo-osmolality and hyponatremia: Secondary | ICD-10-CM | POA: Diagnosis present

## 2013-02-01 DIAGNOSIS — Z940 Kidney transplant status: Secondary | ICD-10-CM

## 2013-02-01 DIAGNOSIS — E875 Hyperkalemia: Secondary | ICD-10-CM | POA: Diagnosis present

## 2013-02-01 DIAGNOSIS — N2581 Secondary hyperparathyroidism of renal origin: Secondary | ICD-10-CM | POA: Diagnosis present

## 2013-02-01 DIAGNOSIS — Z79899 Other long term (current) drug therapy: Secondary | ICD-10-CM

## 2013-02-01 DIAGNOSIS — N186 End stage renal disease: Secondary | ICD-10-CM | POA: Diagnosis present

## 2013-02-01 DIAGNOSIS — Z91199 Patient's noncompliance with other medical treatment and regimen due to unspecified reason: Secondary | ICD-10-CM

## 2013-02-01 DIAGNOSIS — I509 Heart failure, unspecified: Principal | ICD-10-CM | POA: Diagnosis present

## 2013-02-01 DIAGNOSIS — E1039 Type 1 diabetes mellitus with other diabetic ophthalmic complication: Secondary | ICD-10-CM | POA: Diagnosis present

## 2013-02-01 DIAGNOSIS — D631 Anemia in chronic kidney disease: Secondary | ICD-10-CM | POA: Diagnosis present

## 2013-02-01 NOTE — ED Notes (Signed)
Pt c/o SOB since yesterday.  States she is a TTS dialysis pt, had full dialysis tx on Saturday.  Also states she was hospitalized last week for anemia.

## 2013-02-02 ENCOUNTER — Encounter (HOSPITAL_COMMUNITY): Payer: Self-pay | Admitting: Internal Medicine

## 2013-02-02 DIAGNOSIS — E119 Type 2 diabetes mellitus without complications: Secondary | ICD-10-CM

## 2013-02-02 DIAGNOSIS — E875 Hyperkalemia: Secondary | ICD-10-CM

## 2013-02-02 DIAGNOSIS — J811 Chronic pulmonary edema: Secondary | ICD-10-CM | POA: Diagnosis present

## 2013-02-02 DIAGNOSIS — N186 End stage renal disease: Secondary | ICD-10-CM

## 2013-02-02 LAB — BASIC METABOLIC PANEL
BUN: 40 mg/dL — ABNORMAL HIGH (ref 6–23)
Chloride: 83 mEq/L — ABNORMAL LOW (ref 96–112)
Creatinine, Ser: 8.63 mg/dL — ABNORMAL HIGH (ref 0.50–1.10)
GFR calc Af Amer: 6 mL/min — ABNORMAL LOW (ref >90)
Glucose, Bld: 133 mg/dL — ABNORMAL HIGH (ref 70–99)

## 2013-02-02 LAB — CBC
HCT: 24.8 % — ABNORMAL LOW (ref 36.0–46.0)
HCT: 25.4 % — ABNORMAL LOW (ref 36.0–46.0)
Hemoglobin: 8.9 g/dL — ABNORMAL LOW (ref 12.0–15.0)
MCH: 29.4 pg (ref 26.0–34.0)
MCH: 29.8 pg (ref 26.0–34.0)
MCHC: 34.7 g/dL (ref 30.0–36.0)
MCHC: 35 g/dL (ref 30.0–36.0)
MCV: 84.6 fL (ref 78.0–100.0)
MCV: 84.9 fL (ref 78.0–100.0)
RDW: 14.6 % (ref 11.5–15.5)

## 2013-02-02 LAB — GLUCOSE, CAPILLARY: Glucose-Capillary: 112 mg/dL — ABNORMAL HIGH (ref 70–99)

## 2013-02-02 LAB — POCT I-STAT, CHEM 8
BUN: 37 mg/dL — ABNORMAL HIGH (ref 6–23)
Chloride: 89 mEq/L — ABNORMAL LOW (ref 96–112)
Creatinine, Ser: 8.8 mg/dL — ABNORMAL HIGH (ref 0.50–1.10)
Glucose, Bld: 148 mg/dL — ABNORMAL HIGH (ref 70–99)
Potassium: 6.9 mEq/L (ref 3.5–5.1)
Sodium: 121 mEq/L — ABNORMAL LOW (ref 135–145)

## 2013-02-02 LAB — POCT I-STAT TROPONIN I: Troponin i, poc: 0.02 ng/mL (ref 0.00–0.08)

## 2013-02-02 MED ORDER — HYDROCODONE-ACETAMINOPHEN 5-325 MG PO TABS
ORAL_TABLET | ORAL | Status: AC
  Administered 2013-02-02: 1 via ORAL
  Filled 2013-02-02: qty 1

## 2013-02-02 MED ORDER — HEPARIN SODIUM (PORCINE) 1000 UNIT/ML DIALYSIS
20.0000 [IU]/kg | INTRAMUSCULAR | Status: DC | PRN
  Administered 2013-02-02: 1600 [IU] via INTRAVENOUS_CENTRAL

## 2013-02-02 MED ORDER — LIDOCAINE-PRILOCAINE 2.5-2.5 % EX CREA: 1.0000 "application " | TOPICAL_CREAM | CUTANEOUS | Status: DC | PRN

## 2013-02-02 MED ORDER — ALTEPLASE 2 MG IJ SOLR
2.0000 mg | Freq: Once | INTRAMUSCULAR | Status: DC | PRN
  Filled 2013-02-02: qty 2

## 2013-02-02 MED ORDER — ONDANSETRON HCL 4 MG/2ML IJ SOLN: 4.0000 mg | Freq: Four times a day (QID) | INTRAMUSCULAR | Status: DC | PRN

## 2013-02-02 MED ORDER — METOCLOPRAMIDE HCL 5 MG PO TABS
5.0000 mg | ORAL_TABLET | Freq: Four times a day (QID) | ORAL | Status: DC
  Administered 2013-02-02: 5 mg via ORAL
  Filled 2013-02-02 (×4): qty 1

## 2013-02-02 MED ORDER — HEPARIN SODIUM (PORCINE) 1000 UNIT/ML DIALYSIS
1000.0000 [IU] | INTRAMUSCULAR | Status: DC | PRN
  Filled 2013-02-02: qty 1

## 2013-02-02 MED ORDER — PENTAFLUOROPROP-TETRAFLUOROETH EX AERO: 1.0000 "application " | INHALATION_SPRAY | CUTANEOUS | Status: DC | PRN

## 2013-02-02 MED ORDER — INSULIN ASPART 100 UNIT/ML IV SOLN: 10.0000 [IU] | Freq: Once | INTRAVENOUS | Status: DC

## 2013-02-02 MED ORDER — SODIUM CHLORIDE 0.9 % IJ SOLN
3.0000 mL | Freq: Two times a day (BID) | INTRAMUSCULAR | Status: DC
  Administered 2013-02-02: 3 mL via INTRAVENOUS

## 2013-02-02 MED ORDER — SODIUM CHLORIDE 0.9 % IV SOLN: 100.0000 mL | INTRAVENOUS | Status: DC | PRN

## 2013-02-02 MED ORDER — TACROLIMUS 1 MG PO CAPS
2.0000 mg | ORAL_CAPSULE | Freq: Every day | ORAL | Status: DC
  Filled 2013-02-02: qty 2

## 2013-02-02 MED ORDER — NEPRO/CARBSTEADY PO LIQD
237.0000 mL | ORAL | Status: DC | PRN
  Filled 2013-02-02: qty 237

## 2013-02-02 MED ORDER — INSULIN ASPART 100 UNIT/ML ~~LOC~~ SOLN: 0.0000 [IU] | Freq: Three times a day (TID) | SUBCUTANEOUS | Status: DC

## 2013-02-02 MED ORDER — ACETAMINOPHEN 325 MG PO TABS: 650.0000 mg | ORAL_TABLET | Freq: Four times a day (QID) | ORAL | Status: DC | PRN

## 2013-02-02 MED ORDER — SODIUM POLYSTYRENE SULFONATE 15 GM/60ML PO SUSP
30.0000 g | Freq: Once | ORAL | Status: AC
  Administered 2013-02-02: 30 g via ORAL
  Filled 2013-02-02: qty 120

## 2013-02-02 MED ORDER — ALBUTEROL SULFATE (5 MG/ML) 0.5% IN NEBU
10.0000 mg | INHALATION_SOLUTION | Freq: Once | RESPIRATORY_TRACT | Status: AC
  Administered 2013-02-02: 10 mg via RESPIRATORY_TRACT
  Filled 2013-02-02: qty 2

## 2013-02-02 MED ORDER — RENA-VITE PO TABS
1.0000 | ORAL_TABLET | Freq: Every day | ORAL | Status: DC
  Administered 2013-02-02: 1 via ORAL
  Filled 2013-02-02: qty 1

## 2013-02-02 MED ORDER — LIDOCAINE-PRILOCAINE 2.5-2.5 % EX CREA
1.0000 "application " | TOPICAL_CREAM | CUTANEOUS | Status: DC | PRN
  Filled 2013-02-02: qty 5

## 2013-02-02 MED ORDER — LABETALOL HCL 200 MG PO TABS
200.0000 mg | ORAL_TABLET | Freq: Two times a day (BID) | ORAL | Status: DC
  Administered 2013-02-02: 200 mg via ORAL
  Filled 2013-02-02 (×3): qty 1

## 2013-02-02 MED ORDER — NEPRO/CARBSTEADY PO LIQD: 237.0000 mL | ORAL | Status: DC | PRN

## 2013-02-02 MED ORDER — ONDANSETRON HCL 4 MG PO TABS: 4.0000 mg | ORAL_TABLET | Freq: Four times a day (QID) | ORAL | Status: DC | PRN

## 2013-02-02 MED ORDER — CALCIUM ACETATE 667 MG PO CAPS
2001.0000 mg | ORAL_CAPSULE | Freq: Three times a day (TID) | ORAL | Status: DC
  Administered 2013-02-02: 2001 mg via ORAL
  Filled 2013-02-02 (×4): qty 3

## 2013-02-02 MED ORDER — HEPARIN SODIUM (PORCINE) 1000 UNIT/ML DIALYSIS: 1000.0000 [IU] | INTRAMUSCULAR | Status: DC | PRN

## 2013-02-02 MED ORDER — DOXERCALCIFEROL 4 MCG/2ML IV SOLN
6.0000 ug | INTRAVENOUS | Status: DC
  Filled 2013-02-02: qty 4

## 2013-02-02 MED ORDER — HEPARIN SODIUM (PORCINE) 5000 UNIT/ML IJ SOLN
5000.0000 [IU] | Freq: Three times a day (TID) | INTRAMUSCULAR | Status: DC
  Administered 2013-02-02: 5000 [IU] via SUBCUTANEOUS
  Filled 2013-02-02 (×4): qty 1

## 2013-02-02 MED ORDER — DOXERCALCIFEROL 4 MCG/2ML IV SOLN
INTRAVENOUS | Status: AC
  Administered 2013-02-02: 6 ug via INTRAVENOUS
  Filled 2013-02-02: qty 4

## 2013-02-02 MED ORDER — SIMVASTATIN 20 MG PO TABS: 20.0000 mg | ORAL_TABLET | ORAL | Status: DC

## 2013-02-02 MED ORDER — FUROSEMIDE 40 MG PO TABS
40.0000 mg | ORAL_TABLET | Freq: Two times a day (BID) | ORAL | Status: DC
Start: 2013-02-02 — End: 2013-02-02
  Filled 2013-02-02 (×3): qty 1

## 2013-02-02 MED ORDER — ASPIRIN EC 81 MG PO TBEC
81.0000 mg | DELAYED_RELEASE_TABLET | Freq: Every day | ORAL | Status: DC
  Administered 2013-02-02: 81 mg via ORAL
  Filled 2013-02-02: qty 1

## 2013-02-02 MED ORDER — DARBEPOETIN ALFA-POLYSORBATE 200 MCG/0.4ML IJ SOLN
200.0000 ug | INTRAMUSCULAR | Status: DC
  Filled 2013-02-02: qty 0.4

## 2013-02-02 MED ORDER — ACETAMINOPHEN 650 MG RE SUPP: 650.0000 mg | Freq: Four times a day (QID) | RECTAL | Status: DC | PRN

## 2013-02-02 MED ORDER — AMLODIPINE BESYLATE 10 MG PO TABS
10.0000 mg | ORAL_TABLET | Freq: Every day | ORAL | Status: DC
  Administered 2013-02-02: 10 mg via ORAL
  Filled 2013-02-02 (×2): qty 1

## 2013-02-02 MED ORDER — SODIUM CHLORIDE 0.9 % IJ SOLN: 3.0000 mL | Freq: Two times a day (BID) | INTRAMUSCULAR | Status: DC

## 2013-02-02 MED ORDER — DEXTROSE 50 % IV SOLN
1.0000 | Freq: Once | INTRAVENOUS | Status: AC
  Administered 2013-02-02: 50 mL via INTRAVENOUS
  Filled 2013-02-02: qty 50

## 2013-02-02 MED ORDER — INSULIN ASPART 100 UNIT/ML IV SOLN
10.0000 [IU] | Freq: Once | INTRAVENOUS | Status: AC
  Administered 2013-02-02: 10 [IU] via INTRAVENOUS

## 2013-02-02 MED ORDER — PREDNISONE 5 MG PO TABS
5.0000 mg | ORAL_TABLET | Freq: Every day | ORAL | Status: DC
Start: 2013-02-02 — End: 2013-02-02
  Administered 2013-02-02: 5 mg via ORAL
  Filled 2013-02-02: qty 1

## 2013-02-02 MED ORDER — HYDROCODONE-ACETAMINOPHEN 5-325 MG PO TABS: 1.0000 | ORAL_TABLET | Freq: Four times a day (QID) | ORAL | Status: DC | PRN

## 2013-02-02 MED ORDER — DARBEPOETIN ALFA-POLYSORBATE 200 MCG/0.4ML IJ SOLN
INTRAMUSCULAR | Status: AC
  Administered 2013-02-02: 200 ug via INTRAVENOUS
  Filled 2013-02-02: qty 0.4

## 2013-02-02 MED ORDER — SODIUM CHLORIDE 0.9 % IV SOLN
1.0000 g | Freq: Once | INTRAVENOUS | Status: AC
  Administered 2013-02-02: 1 g via INTRAVENOUS
  Filled 2013-02-02: qty 10

## 2013-02-02 MED ORDER — LIDOCAINE HCL (PF) 1 % IJ SOLN: 5.0000 mL | INTRAMUSCULAR | Status: DC | PRN

## 2013-02-02 MED ORDER — INSULIN GLARGINE 100 UNIT/ML ~~LOC~~ SOLN
63.0000 [IU] | Freq: Every day | SUBCUTANEOUS | Status: DC
  Filled 2013-02-02: qty 0.63

## 2013-02-02 NOTE — Progress Notes (Signed)
Patient's K+ is 4.4,Dr. Hyman Hopes notified.No new orders. Kerrington Sova Joselita,RN

## 2013-02-02 NOTE — Progress Notes (Signed)
Subjective:  No complaints this AM, falling asleep as we talk apparently is her baseline.  Issues with AVF last night but according to her OP center they have had no problems Objective Vital signs in last 24 hours: Filed Vitals:   02/02/13 0430 02/02/13 0500 02/02/13 0535 02/02/13 0950  BP: 145/84 147/85 175/81 177/92  Pulse: 99 94 100 112  Temp:   98.5 F (36.9 C) 98.1 F (36.7 C)  TempSrc:   Oral Oral  Resp: 28 28 28 19   Height:      Weight:   80 kg (176 lb 5.9 oz)   SpO2:   96% 98%   Weight change:   Intake/Output Summary (Last 24 hours) at 02/02/13 1005 Last data filed at 02/02/13 0700  Gross per 24 hour  Intake    320 ml  Output   1101 ml  Net   -781 ml   Labs: Basic Metabolic Panel:  Recent Labs Lab 01/27/13 1600  01/28/13 0550 02/01/13 2342 02/02/13 0400 02/02/13 0528  NA 127*  --  136 120* 123*  --   K 5.3*  --  5.6* 7.0* 6.3* 4.4  CL 86*  --  96 82* 83*  --   CO2 28  --  31 26 25   --   GLUCOSE 70  --  109* 146* 133*  --   BUN 40*  --  15 38* 40*  --   CREATININE 7.19*  < > 4.39* 8.37* 8.63*  --   CALCIUM 8.9  --  8.6 9.0 9.7  --   PHOS 9.3*  --   --   --   --   --   < > = values in this interval not displayed. Liver Function Tests:  Recent Labs Lab 01/27/13 1600 01/28/13 0550  AST  --  25  ALT  --  15  ALKPHOS  --  40  BILITOT  --  0.3  PROT  --  7.4  ALBUMIN 2.7* 2.8*   No results found for this basename: LIPASE, AMYLASE,  in the last 168 hours No results found for this basename: AMMONIA,  in the last 168 hours CBC:  Recent Labs Lab 01/27/13 1600 01/27/13 2128 02/01/13 2342 02/02/13 0400  WBC 7.6 6.7 12.6* 11.4*  HGB 7.1* 8.7* 8.9* 8.6*  HCT 20.8* 25.4* 25.4* 24.8*  MCV 87.4 87.6 84.9 84.6  PLT 271 263 239 265   Cardiac Enzymes: No results found for this basename: CKTOTAL, CKMB, CKMBINDEX, TROPONINI,  in the last 168 hours CBG:  Recent Labs Lab 01/27/13 2120 01/28/13 0023 01/28/13 0749 01/28/13 1159 01/28/13 1710  GLUCAP  71 119* 178* 85 86    Iron Studies: No results found for this basename: IRON, TIBC, TRANSFERRIN, FERRITIN,  in the last 72 hours Studies/Results: Dg Chest Port 1 View  02/02/2013  *RADIOLOGY REPORT*  Clinical Data: Chest pain.  Short of breath.  Dialysis.  PORTABLE CHEST - 1 VIEW  Comparison: 01/27/2013.  Findings: Findings remain compatible with volume overload. Cardiomegaly, interstitial and mild alveolar edema and cephalization of pulmonary blood flow. Monitoring leads are projected over the chest.  Probable small left pleural effusion.  IMPRESSION: Volume overload/CHF.   Original Report Authenticated By: Andreas Newport, M.D.    Medications: Infusions:    Scheduled Medications: . amLODipine  10 mg Oral Q breakfast  . aspirin EC  81 mg Oral Daily  . calcium acetate  2,001 mg Oral TID WC  . darbepoetin (ARANESP) injection - DIALYSIS  200 mcg Intravenous Q Tue-HD  . heparin  5,000 Units Subcutaneous Q8H  . insulin aspart  0-9 Units Subcutaneous TID WC  . insulin glargine  63 Units Subcutaneous QHS  . labetalol  200 mg Oral BID  . metoCLOPramide  5 mg Oral QID  . multivitamin  1 tablet Oral Daily  . predniSONE  5 mg Oral Daily  . [START ON 02/03/2013] simvastatin  20 mg Oral Q M,W,F  . sodium chloride  3 mL Intravenous Q12H  . sodium chloride  3 mL Intravenous Q12H  . tacrolimus  2 mg Oral QHS    have reviewed scheduled and prn medications.  Physical Exam: General: NAD, falling alseep Heart: tachy, regular Lungs: anteriorly clear Abdomen: soft, non tender Extremities: some edema Dialysis Access: left upper arm AVF, good thrill    Assessment/ Plan: Pt is a 37 y.o. yo female who was admitted on 02/01/2013 with hyperkalemia and volume overload despite full treatment on Saturday- dietary and fluid restriction non compliance has been an issue  Assessment/Plan: 1. Hyperkalemia- was able to get it down some with short dialysis last night before access malfunction.  Will complete  treatment today to further bring down 2. ESRD- normally TTS at [facility] via AVF- only got 1 hour last night, will do three hours today to complete treatment and make sure no other issue with access 3. 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 Secondary hyperparathyroidism- prescribed phoslo but doubt compliance, on hectorol 6 will give while here 5. HTN/volume- volume overloaded- UF max as tolerated today- on norvasc/labetalol 6. Previous transplant- still on prograf and pred 7. Dispo- if no problems with rest of treatment today and no other issues arise, would be OK for discharge later today from our standpoint.  Dietary compliance is stressed as this hyperkalemia could have had disastrous complication.   [name] A   02/02/2013,10:05 AM  LOS: 1 day

## 2013-02-02 NOTE — Progress Notes (Signed)
02/02/13 02:50 Received patient from ED,admitted due to SOB.Patient is alert and oriented x3.Placed on telemetry box 6710,CCMD notified.Patient has scatterred bruising on right arm.Oriented to unit routines,advised to call for assist.Call bell at bedside and within reach. Vickey Boak Joselita,RN

## 2013-02-02 NOTE — Progress Notes (Signed)
AVS reviewed with pt. Pt verbalized understanding of AVS and questions were answered. IV and tele removed. Pt remains stable. Ride here to pick up pt. Pt transported via wheelchair to exit of facility. [place], Rosanna Randy

## 2013-02-02 NOTE — Procedures (Signed)
Patient was seen on dialysis and the procedure was supervised.  BFR 300  Via AVF BP is  173/82.   Patient appears to be tolerating treatment well  [name] A 02/02/2013

## 2013-02-02 NOTE — Plan of Care (Signed)
Problem: Undesirable Food Choices (NB-1.7) Goal: Nutrition education Formal process to instruct or train a patient/client in a skill or to impart knowledge to help patients/clients voluntarily manage or modify food choices and eating behavior to maintain or improve health. Outcome: Completed/Met Date Met:  02/02/13   Nutrition Education Note  RD consulted for diet education. Pt admitted 2/2 hyperkalemia. During interview, pt reports that she consumed 2 bananas yesterday. Pt unable to state why she chose those foods and able to verbalize other high-potassium containing foods. Provided Renal Food Pyramid. Reviewed food groups and provided written recommended serving sizes specifically determined for patient's current nutritional status.   Explained why diet restrictions are needed and provided lists of foods to limit/avoid that are high potassium, sodium, and phosphorus. Provided specific recommendations on safer alternatives of these foods. Strongly encouraged compliance of this diet.   Discussed importance of protein intake at each meal and snack. Provided examples of how to maximize protein intake throughout the day. Discussed need for fluid restriction with dialysis, importance of minimizing weight gain between HD treatments, and renal-friendly beverage options.  Encouraged pt to discuss specific diet questions/concerns with RD at HD outpatient facility. Teach back method used.  Expect poor compliance.  Body mass index is 31.88 kg/(m^2). Pt meets criteria for Obese Class I based on current BMI.  Labs and medications reviewed. No further nutrition interventions warranted at this time. RD contact information provided. If additional nutrition issues arise, please re-consult RD.  Jarold Motto MS, RD, LDN Pager: [phone] After-hours pager: [phone]

## 2013-02-02 NOTE — ED Notes (Signed)
I stat chem 8 results given to Dr. Otter by B. Keesha Pellum, EMT 

## 2013-02-02 NOTE — ED Provider Notes (Addendum)
History     CSN: [id number]  Arrival date & time 02/01/13  2300   First MD Initiated Contact with Patient 02/01/13 2329      Chief Complaint  Patient presents with  . Shortness of Breath  . Dialysis pt     (Consider location/radiation/quality/duration/timing/severity/associated sxs/prior treatment) HPI 37 year old female presents to emergency room complaining of shortness of breath.  She is a dialysis patient, dialysis on Tuesday, Thursday, Saturday.  Patient recent admission last week for anemia.  Discharged on Thursday.  Had dialysis on Saturday, as normal.  Patient was seen at [facility] today for evaluation by a hematologist.  She reports shortness of breath since Sunday worse today.  She is reporting dyspnea on exertion, but mostly orthopnea.  Patient feels that she needs dialysis.  She denies any specific chest pain, but reports total body pain.    Past Medical History  Diagnosis Date  . History of renal transplant 2010  . Diabetic retinopathy(362.0)   . HTN (hypertension)     not well controlled  . 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 HLD (hyperlipidemia)   . ESRD on dialysis 10/20/2008    Hx of pancreas-kidney transplant in 2010, started back on dialysis in Oct 2013. Back on insulin also due to failure of pancreatic graft.    [name] ESRD on dialysis     "[facility] [facility]; TTS; (01/27/2013)  . History of blood transfusion     "lots of times" (01/27/2013)  . CAP (community acquired pneumonia) 2010  . Shortness of breath     "w/dialysis" (01/27/2013)  . Type II diabetes mellitus   . Anemia   . Ovarian tumor, malignant 2005    resected in [place] pt denies that this was cancer on 01/27/2013    Past Surgical History  Procedure Laterality Date  . Tumor removal  2005    Ovarian, resected in [place]  . Av fistula placement  06/03/2012    Procedure: INSERTION OF ARTERIOVENOUS (AV) GORE-TEX GRAFT ARM;   Surgeon: Larina Earthly, MD;  Location: [facility] OR;  Service: Vascular;  Laterality: Left;  [name] Eye surgery Right     "cause of diabetes" (4/23/20140  . Combined kidney-pancreas transplant  2010    /notes 01/27/2013; @ [facility]  . 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 Renal biopsy  12/13/2010; 10/25/2011    [name] 01/27/2013    Family History  Problem Relation Age of Onset  . Diabetes Mother   . Liver cancer Father   . Cancer Father     History  Substance Use Topics  . Smoking status: Never Smoker   . Smokeless tobacco: Never Used  . Alcohol Use: No    OB History   Grav Para Term Preterm Abortions TAB SAB Ect Mult Living                  Review of Systems  See History of Present Illness; otherwise all other systems are reviewed and negative  Allergies  Ace inhibitors and Alemtuzumab  Home Medications   Current Outpatient Rx  Name  Route  Sig  Dispense  Refill  . amLODipine (NORVASC) 10 MG tablet   Oral   Take 10 mg by mouth daily with breakfast.          . aspirin EC 81 MG tablet   Oral  Take 81 mg by mouth daily.         . calcium acetate (PHOSLO) 667 MG capsule   Oral   Take 3 capsules (2,001 mg total) by mouth 3 (three) times daily with meals.         . furosemide (LASIX) 40 MG tablet   Oral   Take 1 tablet (40 mg total) by mouth 2 (two) times daily.   60 tablet   0   . HYDROcodone-acetaminophen (NORCO/VICODIN) 5-325 MG per tablet   Oral   Take 1 tablet by mouth every 6 (six) hours as needed for pain.   15 tablet   0   . insulin glargine (LANTUS) 100 UNIT/ML injection   Subcutaneous   Inject 63 Units into the skin at bedtime.          [name] labetalol (NORMODYNE) 100 MG tablet   Oral   Take 200 mg by mouth 2 (two) times daily.          . metoCLOPramide (REGLAN) 5 MG tablet   Oral   Take 5 mg by mouth 4 (four) times daily.         . multivitamin (RENA-VIT) TABS tablet    Oral   Take 1 tablet by mouth daily.         . predniSONE (DELTASONE) 5 MG tablet   Oral   Take 5 mg by mouth daily.          . simvastatin (ZOCOR) 20 MG tablet   Oral   Take 20 mg by mouth every Monday, Wednesday, and Friday.         . tacrolimus (PROGRAF) 1 MG capsule   Oral   Take 2 capsules (2 mg total) by mouth at bedtime.           BP 152/79  Pulse 88  Temp(Src) 99 F (37.2 C) (Oral)  Resp 20  SpO2 97%  Physical Exam  Constitutional: She is oriented to person, place, and time. She appears distressed (uncomfortable appearing).  HENT:  Head: Normocephalic and atraumatic.  Nose: Nose normal.  Mouth/Throat: Oropharynx is clear and moist. No oropharyngeal exudate.  Neck: Normal range of motion. Neck supple. No JVD present. No tracheal deviation present. No thyromegaly present.  Cardiovascular: Normal rate, regular rhythm and normal heart sounds.  Exam reveals no gallop and no friction rub.   No murmur heard. Pulmonary/Chest: Effort normal. No stridor. No respiratory distress. She has no wheezes. She has rales. She exhibits no tenderness.  Musculoskeletal: Normal range of motion. She exhibits edema. She exhibits no tenderness.  Lymphadenopathy:    She has no cervical adenopathy.  Neurological: She is alert and oriented to person, place, and time. She exhibits normal muscle tone. Coordination normal.  Skin: Skin is warm and dry. No rash noted. No erythema. No pallor.  Scattered bruising over the skin  Psychiatric: She has a normal mood and affect. Her behavior is normal. Judgment normal.    ED Course  Procedures (including critical care time) CRITICAL CARE Performed by: Olivia Mackie   Total critical care time: 45  Critical care time was exclusive of separately billable procedures and treating other patients.  Critical care was necessary to treat or prevent imminent or life-threatening deterioration.  Critical care was time spent personally by me on the  following activities: development of treatment plan with patient and/or surrogate as well as nursing, discussions with consultants, evaluation of patient's response to treatment, examination of patient, obtaining history from patient or  surrogate, ordering and performing treatments and interventions, ordering and review of laboratory studies, ordering and review of radiographic studies, pulse oximetry and re-evaluation of patient's condition.  Labs Reviewed  CBC  BASIC METABOLIC PANEL  PRO B NATRIURETIC PEPTIDE  POCT I-STAT TROPONIN I   Dg Chest Port 1 View  02/02/2013  *RADIOLOGY REPORT*  Clinical Data: Chest pain.  Short of breath.  Dialysis.  PORTABLE CHEST - 1 VIEW  Comparison: 01/27/2013.  Findings: Findings remain compatible with volume overload. Cardiomegaly, interstitial and mild alveolar edema and cephalization of pulmonary blood flow. Monitoring leads are projected over the chest.  Probable small left pleural effusion.  IMPRESSION: Volume overload/CHF.   Original Report Authenticated By: Andreas Newport, M.D.     Date: 02/01/2013  Rate: 89  Rhythm: normal sinus rhythm  QRS Axis: right  Intervals: QT prolonged  ST/T Wave abnormalities: hyperacute peaked t waves  Conduction Disutrbances:none  Narrative Interpretation:   Old EKG Reviewed: changes noted    1. Hyperkalemia   2. Pulmonary edema   3. Shortness of breath   4. ESRD on dialysis       MDM  37 year old female dialysis patient noted to have hyperacute T waves on her EKG.  Her serum potassium is 6.9.  Will start hyperkalemia treatment, as well as discussion with nephrology for emergent dialysis.  Patient does not make urine.       Olivia Mackie, MD 02/02/13 0010  12:35 AM D/w Dr Hyman Hopes, on call for [place] [facility].  He has arranged dialysis as inpatient asap, predicted around 3 am at this time.  Medical management at this time.  Olivia Mackie, MD 02/02/13 [phone]

## 2013-02-02 NOTE — ED Notes (Signed)
Kim from lab called with critical lab potassium=7.0 mEq/L; will advise Dr. Norlene Campbell

## 2013-02-02 NOTE — Consult Note (Signed)
Referring Provider: No ref. provider found Primary Care Physician:  Juline Patch, MD Primary Nephrologist:  Dr. Briant Cedar  Reason for Consultation: Medical management of ESRD, Hypertension anemia and pulmonary edema  HPI: [name] is a 37 y.o. female with known history of ESRD on hemodialysis TTS at [facility], hypertension, diabetes mellitus type one and failed renal and pancreatic transplants presents to the ER because of shortness of breath. Patient states that she has been progressively getting short of breath over the last 2 days. Denies having missed any dialysis.Patient was admitted last week for anemia. In the ER chest x-ray revealed features consistent with CHF and labs show hyperkalemia with EKG changes. Patient was given Kayexalate 30 g along with insulin D50 and calcium. On-call nephrologist was consulted and patient was about to get dialysis. Patient otherwise denies any chest pain fever chills nausea vomiting abdominal pain or diarrhea.      Past Medical History  Diagnosis Date  . History of renal transplant 2010  . Diabetic retinopathy(362.0)   . HTN (hypertension)     not well controlled  . 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 HLD (hyperlipidemia)   . ESRD on dialysis 10/20/2008    Hx of pancreas-kidney transplant in 2010, started back on dialysis in Oct 2013. Back on insulin also due to failure of pancreatic graft.    [name] ESRD on dialysis     "[facility]; TTS; (01/27/2013)  . History of blood transfusion     "lots of times" (01/27/2013)  . CAP (community acquired pneumonia) 2010  . Shortness of breath     "w/dialysis" (01/27/2013)  . Type II diabetes mellitus   . Anemia   . Ovarian tumor, malignant 2005    resected in [place] pt denies that this was cancer on 01/27/2013    Past Surgical History  Procedure Laterality Date  . Tumor removal  2005    Ovarian, resected in [place]  .  Av fistula placement  06/03/2012    Procedure: INSERTION OF ARTERIOVENOUS (AV) GORE-TEX GRAFT ARM;  Surgeon: Larina Earthly, MD;  Location: [facility] OR;  Service: Vascular;  Laterality: Left;  [name] Eye surgery Right     "cause of diabetes" (4/23/20140  . Combined kidney-pancreas transplant  2010    /notes 01/27/2013; @ [facility]  . 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 Renal biopsy  12/13/2010; 10/25/2011    [name] 01/27/2013    Prior to Admission medications   Medication Sig Start Date End Date Taking? Authorizing Provider  amLODipine (NORVASC) 10 MG tablet Take 10 mg by mouth daily with breakfast.    Yes Historical Provider, MD  aspirin EC 81 MG tablet Take 81 mg by mouth daily.   Yes Historical Provider, MD  calcium acetate (PHOSLO) 667 MG capsule Take 3 capsules (2,001 mg total) by mouth 3 (three) times daily with meals. 01/28/13  Yes Sheffield Slider, PA-C  furosemide (LASIX) 40 MG tablet Take 1 tablet (40 mg total) by mouth 2 (two) times daily. 06/12/12  Yes Srikar Cherlynn Kaiser, MD  HYDROcodone-acetaminophen (NORCO/VICODIN) 5-325 MG per tablet Take 1 tablet by mouth every 6 (six) hours as needed for pain. 10/21/12  Yes Fayrene Helper, PA-C  insulin glargine (LANTUS) 100 UNIT/ML injection Inject 63 Units into the skin at bedtime.    Yes Historical Provider, MD  labetalol (  NORMODYNE) 100 MG tablet Take 200 mg by mouth 2 (two) times daily.    Yes Historical Provider, MD  metoCLOPramide (REGLAN) 5 MG tablet Take 5 mg by mouth 4 (four) times daily.   Yes Historical Provider, MD  multivitamin (RENA-VIT) TABS tablet Take 1 tablet by mouth daily.   Yes Historical Provider, MD  predniSONE (DELTASONE) 5 MG tablet Take 5 mg by mouth daily.    Yes Historical Provider, MD  simvastatin (ZOCOR) 20 MG tablet Take 20 mg by mouth every Monday, Wednesday, and Friday.   Yes Historical Provider, MD  tacrolimus (PROGRAF) 1 MG capsule Take 2 capsules (2 mg total) by  mouth at bedtime. 01/28/13  Yes Sheffield Slider, PA-C    Current Facility-Administered Medications  Medication Dose Route Frequency Provider Last Rate Last Dose  . acetaminophen (TYLENOL) tablet 650 mg  650 mg Oral Q6H PRN Eduard Clos, MD       Or  . acetaminophen (TYLENOL) suppository 650 mg  650 mg Rectal Q6H PRN Eduard Clos, MD      . amLODipine (NORVASC) tablet 10 mg  10 mg Oral Q breakfast Eduard Clos, MD      . aspirin EC tablet 81 mg  81 mg Oral Daily Eduard Clos, MD      . calcium acetate (PHOSLO) capsule 2,001 mg  2,001 mg Oral TID WC Eduard Clos, MD      . furosemide (LASIX) tablet 40 mg  40 mg Oral BID Eduard Clos, MD      . heparin injection 5,000 Units  5,000 Units Subcutaneous Q8H Eduard Clos, MD      . HYDROcodone-acetaminophen (NORCO/VICODIN) 5-325 MG per tablet 1 tablet  1 tablet Oral Q6H PRN Eduard Clos, MD      . insulin aspart (novoLOG) injection 0-9 Units  0-9 Units Subcutaneous TID WC Eduard Clos, MD      . insulin glargine (LANTUS) injection 63 Units  63 Units Subcutaneous QHS Eduard Clos, MD      . labetalol (NORMODYNE) tablet 200 mg  200 mg Oral BID Eduard Clos, MD      . metoCLOPramide (REGLAN) tablet 5 mg  5 mg Oral QID Eduard Clos, MD      . multivitamin (RENA-VIT) tablet 1 tablet  1 tablet Oral Daily Eduard Clos, MD      . ondansetron [facility]) tablet 4 mg  4 mg Oral Q6H PRN Eduard Clos, MD       Or  . ondansetron [facility]) injection 4 mg  4 mg Intravenous Q6H PRN Eduard Clos, MD      . predniSONE (DELTASONE) tablet 5 mg  5 mg Oral Daily Eduard Clos, MD      . Melene Muller ON 02/03/2013] simvastatin (ZOCOR) tablet 20 mg  20 mg Oral Q M,W,F Eduard Clos, MD      . sodium chloride 0.9 % injection 3 mL  3 mL Intravenous Q12H Eduard Clos, MD      . sodium chloride 0.9 % injection 3 mL  3 mL Intravenous Q12H Eduard Clos, MD       . tacrolimus (PROGRAF) capsule 2 mg  2 mg Oral QHS Eduard Clos, MD        Allergies as of 02/01/2013 - Review Complete 02/01/2013  Allergen Reaction Noted  . Ace inhibitors Other (See Comments) 08/12/2008  . Alemtuzumab Other (See Comments) 02/01/2013  Family History  Problem Relation Age of Onset  . Diabetes Mother   . Liver cancer Father   . Cancer Father     History   Social History  . Marital Status: Married    Spouse Name: N/A    Number of Children: 1  . Years of Education: N/A   Occupational History  . Unemployed    Social History Main Topics  . Smoking status: Never Smoker   . Smokeless tobacco: Never Used  . Alcohol Use: No  . Drug Use: No  . Sexually Active: No   Other Topics Concern  . Not on file   Social History Narrative   Lives with husband and son (32yrs old)..Unemployed                   Review of Systems: Gen: Denies any fever, chills, sweats, anorexia, fatigue, HEENT: No visual complaints,  history of Retinopathy. Normal external appearance No Epistaxis or Sore throat. No sinusitis.   CV: Admits to Chest heaviness on exercise and dyspnea on minimal exertion Resp: Denies dyspnea at rest,, cough, sputum, wheezing, coughing up blood, and pleurisy. GI: Denies vomiting blood, jaundice, and fecal incontinence.   Denies dysphagia or odynophagia. GU : End stage renal disease and anuric MS: Denies joint pain, limitation of movement, and swelling, stiffness, low back pain, extremity pain. Denies muscle weakness, cramps, atrophy.  No use of non steroidal antiinflammatory drugs. Derm: Denies rash, itching, dry skin, hives, moles, warts, or unhealing ulcers.  Psych: Depression Heme: Anemia with hyporesponsiveness to erythropoietin Neuro: No headache.  No diplopia. No dysarthria.  No dysphasia.  No history of CVA.  No Seizures. No paresthesias.  No weakness. Endocrine  DM with history of pancreatic transplant.  No Thyroid disease.  No Adrenal  disease.  Physical Exam: Vital signs in last 24 hours: Temp:  [98.7 F (37.1 C)-99 F (37.2 C)] 98.7 F (37.1 C) (04/29 0347) Pulse Rate:  [88-106] 94 (04/29 0500) Resp:  [18-28] 28 (04/29 0500) BP: (145-183)/(73-93) 147/85 mmHg (04/29 0500) SpO2:  [83 %-100 %] 100 % (04/29 0400) Weight:  [73.624 kg (162 lb 5 oz)-81.7 kg (180 lb 1.9 oz)] 81.7 kg (180 lb 1.9 oz) (04/29 0347) Last BM Date: 02/02/13 General:   Chronically Ill  Head:  Normocephalic and atraumatic. Eyes:  Sclera clear, no icterus.   Conjunctiva Pale Ears:  Normal auditory acuity. Nose:  No deformity, discharge,  or lesions. Mouth:  No deformity or lesions, dentition normal. Neck:  Supple; no masses or thyromegaly. JVP Elevated mild Lungs:  Diminished in bases with occassional rales Heart:  Regular rate and rhythm; no murmurs, clicks, rubs,  or gallops. Abdomen:  Soft, nontender and nondistended. No masses, hepatosplenomegaly or hernias noted. Normal bowel sounds, without guarding, and without rebound. Disteended Msk:  Symmetrical without gross deformities. Normal posture. Pulses:  No carotid, renal, femoral bruits. DP and PT symmetrical and equal Extremities:  Without clubbing or edema. Neurologic:  Alert and  oriented x4;  grossly normal neurologically. Skin:  Intact without significant lesions or rashes. Cervical Nodes:  No significant cervical adenopathy. Psych:  Alert and cooperative. Normal mood and affect.  Intake/Output from previous day:   Intake/Output this shift:    Lab Results:  Recent Labs  02/01/13 2342 02/02/13 0400  WBC 12.6* 11.4*  HGB 8.9* 8.6*  HCT 25.4* 24.8*  PLT 239 265   BMET  Recent Labs  02/01/13 2342 02/02/13 0400  NA 120* 123*  K 7.0* 6.3*  CL 82* 83*  CO2 26 25  GLUCOSE 146* 133*  BUN 38* 40*  CREATININE 8.37* 8.63*  CALCIUM 9.0 9.7   LFT No results found for this basename: PROT, ALBUMIN, AST, ALT, ALKPHOS, BILITOT, BILIDIR, IBILI,  in the last 72 hours PT/INR No  results found for this basename: LABPROT, INR,  in the last 72 hours Hepatitis Panel No results found for this basename: HEPBSAG, HCVAB, HEPAIGM, HEPBIGM,  in the last 72 hours  Studies/Results: Dg Chest Port 1 View  02/02/2013  *RADIOLOGY REPORT*  Clinical Data: Chest pain.  Short of breath.  Dialysis.  PORTABLE CHEST - 1 VIEW  Comparison: 01/27/2013.  Findings: Findings remain compatible with volume overload. Cardiomegaly, interstitial and mild alveolar edema and cephalization of pulmonary blood flow. Monitoring leads are projected over the chest.  Probable small left pleural effusion.  IMPRESSION: Volume overload/CHF.   Original Report Authenticated By: Andreas Newport, M.D.     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

## 2013-02-02 NOTE — H&P (Addendum)
[facility] History and Physical  [name] ZOX:[id number] DOB: 04-28-1976 DOA: 02/01/2013  Referring physician: Dr.Otter. PCP: Alicia Patch, MD  Specialists: [facility].  Chief Complaint: Shortness of breath.  HPI: [name] is a 37 y.o. female with known history of ESRD on hemodialysis, hypertension, diabetes mellitus type one and failed renal and pancreatic transplants presents to the ER because of shortness of breath. Patient states that she has been progressively getting short of breath over the last 2 days. Denies having missed any dialysis. She usually gets dialyzed on Tuesday Thursday and Saturday. Patient was admitted last week for anemia. In the ER chest x-ray revealed features consistent with CHF and labs show hyperkalemia with EKG changes. Patient was given Kayexalate 30 g along with insulin D50 and calcium. On-call nephrologist was consulted and patient was about to get dialysis. Patient otherwise denies any chest pain fever chills nausea vomiting abdominal pain or diarrhea.   Review of Systems: As presented in the history of presenting illness, rest negative.  Past Medical History  Diagnosis Date  . History of renal transplant 2010  . Diabetic retinopathy(362.0)   . HTN (hypertension)     not well controlled  . 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 HLD (hyperlipidemia)   . ESRD on dialysis 10/20/2008    Hx of pancreas-kidney transplant in 2010, started back on dialysis in Oct 2013. Back on insulin also due to failure of pancreatic graft.    [name] ESRD on dialysis     "[facility]; TTS; (01/27/2013)  . History of blood transfusion     "lots of times" (01/27/2013)  . CAP (community acquired pneumonia) 2010  . Shortness of breath     "w/dialysis" (01/27/2013)  . Type II diabetes mellitus   . Anemia   . Ovarian tumor, malignant 2005    resected in [place] pt denies  that this was cancer on 01/27/2013   Past Surgical History  Procedure Laterality Date  . Tumor removal  2005    Ovarian, resected in [place]  . Av fistula placement  06/03/2012    Procedure: INSERTION OF ARTERIOVENOUS (AV) GORE-TEX GRAFT ARM;  Surgeon: Larina Earthly, MD;  Location[facility] OR;  Service: Vascular;  Laterality: Left;  [name] Eye surgery Right     "cause of diabetes" (4/23/20140  . Combined kidney-pancreas transplant  2010    /notes 01/27/2013; @ [facility]  . 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 Renal biopsy  12/13/2010; 10/25/2011    [name] 01/27/2013   Social History:  reports that she has never smoked. She has never used smokeless tobacco. She reports that she does not drink alcohol or use illicit drugs. lives at home. where does patient live-- can do ADLs. Can patient participate in ADLs?  Allergies  Allergen Reactions  . Ace Inhibitors Other (See Comments)     cause cough  . Alemtuzumab Other (See Comments)    unknown    Family History  Problem Relation Age of Onset  . Diabetes Mother   . Liver cancer Father   . Cancer Father       Prior to Admission medications   Medication Sig Start Date End Date Taking? Authorizing Provider  amLODipine (NORVASC) 10 MG tablet Take 10 mg by mouth daily with breakfast.    Yes Historical Provider, MD  aspirin  EC 81 MG tablet Take 81 mg by mouth daily.   Yes Historical Provider, MD  calcium acetate (PHOSLO) 667 MG capsule Take 3 capsules (2,001 mg total) by mouth 3 (three) times daily with meals. 01/28/13  Yes Sheffield Slider, PA-C  furosemide (LASIX) 40 MG tablet Take 1 tablet (40 mg total) by mouth 2 (two) times daily. 06/12/12  Yes Srikar Cherlynn Kaiser, MD  HYDROcodone-acetaminophen (NORCO/VICODIN) 5-325 MG per tablet Take 1 tablet by mouth every 6 (six) hours as needed for pain. 10/21/12  Yes Fayrene Helper, PA-C  insulin glargine (LANTUS) 100 UNIT/ML injection Inject 63 Units into  the skin at bedtime.    Yes Historical Provider, MD  labetalol (NORMODYNE) 100 MG tablet Take 200 mg by mouth 2 (two) times daily.    Yes Historical Provider, MD  metoCLOPramide (REGLAN) 5 MG tablet Take 5 mg by mouth 4 (four) times daily.   Yes Historical Provider, MD  multivitamin (RENA-VIT) TABS tablet Take 1 tablet by mouth daily.   Yes Historical Provider, MD  predniSONE (DELTASONE) 5 MG tablet Take 5 mg by mouth daily.    Yes Historical Provider, MD  simvastatin (ZOCOR) 20 MG tablet Take 20 mg by mouth every Monday, Wednesday, and Friday.   Yes Historical Provider, MD  tacrolimus (PROGRAF) 1 MG capsule Take 2 capsules (2 mg total) by mouth at bedtime. 01/28/13  Yes Sheffield Slider, PA-C   Physical Exam: Filed Vitals:   02/01/13 2352 02/02/13 0015 02/02/13 0030 02/02/13 0045  BP: 152/79 153/79 153/83 166/82  Pulse: 88 88 89 92  Temp:      TempSrc:      Resp: 20 18 26 22   SpO2: 97% 95% 93% 100%     General:   Well-developed and nourished.  Eyes:  periorbital edema. Anicteric mild pallor.  ENT:  no discharge from the ears eyes nose mouth.  Neck:  no mass felt.  Cardiovascular:  S1-S2 heard.  Respiratory:  no rhonchi mildl crepitations.  Abdomen:  soft nontender bowel sounds present.  Skin:  no rash.  Musculoskeletal:  edema.  Psychiatric:  appears normal.  Neurologic:  alert awake oriented to time place and person. Moves all extremities.  Labs on Admission:  Basic Metabolic Panel:  Recent Labs Lab 01/27/13 1600 01/27/13 2128 01/28/13 0550 02/01/13 2342  NA 127*  --  136 120*  K 5.3*  --  5.6* 7.0*  CL 86*  --  96 82*  CO2 28  --  31 26  GLUCOSE 70  --  109* 146*  BUN 40*  --  15 38*  CREATININE 7.19* 3.10* 4.39* 8.37*  CALCIUM 8.9  --  8.6 9.0  PHOS 9.3*  --   --   --    Liver Function Tests:  Recent Labs Lab 01/27/13 1600 01/28/13 0550  AST  --  25  ALT  --  15  ALKPHOS  --  40  BILITOT  --  0.3  PROT  --  7.4  ALBUMIN 2.7* 2.8*   No  results found for this basename: LIPASE, AMYLASE,  in the last 168 hours No results found for this basename: AMMONIA,  in the last 168 hours CBC:  Recent Labs Lab 01/27/13 1600 01/27/13 2128 02/01/13 2342  WBC 7.6 6.7 12.6*  HGB 7.1* 8.7* 8.9*  HCT 20.8* 25.4* 25.4*  MCV 87.4 87.6 84.9  PLT 271 263 239   Cardiac Enzymes: No results found for this basename: CKTOTAL, CKMB, CKMBINDEX, TROPONINI,  in  the last 168 hours  BNP (last 3 results)  Recent Labs  02/01/13 2344  PROBNP 58488.0*   CBG:  Recent Labs Lab 01/27/13 2120 01/28/13 0023 01/28/13 0749 01/28/13 1159 01/28/13 1710  GLUCAP 71 119* 178* 85 86    Radiological Exams on Admission: Dg Chest Port 1 View  02/02/2013  *RADIOLOGY REPORT*  Clinical Data: Chest pain.  Short of breath.  Dialysis.  PORTABLE CHEST - 1 VIEW  Comparison: 01/27/2013.  Findings: Findings remain compatible with volume overload. Cardiomegaly, interstitial and mild alveolar edema and cephalization of pulmonary blood flow. Monitoring leads are projected over the chest.  Probable small left pleural effusion.  IMPRESSION: Volume overload/CHF.   Original Report Authenticated By: Andreas Newport, M.D.     EKG: Independently reviewed. Sinus rhythm with tall T waves suggestive of hyperkalemia.  Assessment/Plan Principal Problem:   Pulmonary edema Active Problems:   HYPERLIPIDEMIA   HYPERTENSION   ESRD on dialysis   History of renal transplant   Anemia   DM (diabetes mellitus)   1. Pulmonary edema and hyperkalemia secondary to fluid overload secondary to ESRD - patient is about to get dialysis per nephrologist. I think patient's symptoms will improve with dialysis. Closely follow patient's respiratory status in telemetry. Follow labs after dialysis. 2. Anemia secondary to ESRD - closely follow CBC. 3. Diabetes mellitus type 1 - patient is on insulin again after failed pancreatic transplant. Closely follow CBGs. 4. Hypertension - continue home  medications. 5. Hyperlipidemia - continue home medications. 6. Hyponatremia probably from fluid overload which I think will improve with dialysis. Follow metabolic panel after dialysis. 7. Leukocytosis - probably reactionary. Patient is afebrile. Closely follow trends.    Code Status: Full code.  Family Communication: None.  Disposition Plan: Admit to inpatient.    Zenora Karpel N. [facility] Pager [phone].  If 7PM-7AM, please contact night-coverage [email or web address] Password [facility] 02/02/2013, 1:47 AM

## 2013-02-02 NOTE — Progress Notes (Signed)
Pt seen and examined, admitted this am with hyperkalemia and volume overload -suspect dietary indiscretion -HD incomplete earlier today -Dietician consult -DC home later today after HD   Zannie Cove, MD [phone]

## 2013-02-02 NOTE — Procedures (Signed)
I have seen and examined this patient and agree with the plan of care . Patient hyperkalemic and ESRD. Patient emergently dialysis. Unfortunately unable top complete treatment due to infiltration. Dialysis nurse reports clots with cannulation. We will recheck a potassium and have VVS evaluate in AM

## 2013-02-02 NOTE — Progress Notes (Signed)
K+ 6.3  Received from lab. Pt just started on dialysis. Prior K+ level 7.0

## 2013-02-02 NOTE — Discharge Summary (Signed)
Physician Discharge Summary  [name] WUJ:[id number] DOB: Feb 24, 1976 DOA: 02/01/2013  PCP: Juline Patch, MD  Admit date: 02/01/2013 Discharge date: 02/02/2013  Time spent: 50 minutes  Recommendations for Outpatient Follow-up:  1. PCP in 1 week  Discharge Diagnoses:    Hyperkalemia   Pulmonary edema   HYPERLIPIDEMIA   HYPERTENSION   ESRD on dialysis   History of renal transplant   Anemia   DM (diabetes mellitus)   Discharge Condition: stable  Diet recommendation: Renal diabetic diet  Filed Weights   02/02/13 0535 02/02/13 1100 02/02/13 1415  Weight: 80 kg (176 lb 5.9 oz) 81.6 kg (179 lb 14.3 oz) 76.1 kg (167 lb 12.3 oz)    History of present illness:  HPI: [name] is a 37 y.o. female with known history of ESRD on hemodialysis, hypertension, diabetes mellitus type one and failed renal and pancreatic transplants presents to the ER because of shortness of breath. Patient states that she has been progressively getting short of breath over the last 2 days. Denies having missed any dialysis. She usually gets dialyzed on Tuesday Thursday and Saturday. Patient was admitted last week for anemia. In the ER chest x-ray revealed features consistent with CHF and labs show hyperkalemia with EKG changes. Patient was given Kayexalate 30 g along with insulin D50 and calcium. On-call nephrologist was consulted and patient was about to get dialysis. Patient otherwise denies any chest pain fever chills nausea vomiting abdominal pain or diarrhea.   Hospital Course:  [name] was admitted earlier this am with hyperkalemia and volume overload from dietary indiscretion, she ate 2 bananas the day prior and is not very careful with salt restriction, she was urgently dialyzed and her hyperkalemia corrected. She was seen by the Dietician and compliance was emphasized again. She is discharged home in a stable condition to FU with her nephrologist at HD on Thursday.   Consultations:  Renal   Discharge  Exam: Filed Vitals:   02/02/13 1330 02/02/13 1400 02/02/13 1415 02/02/13 1453  BP: 186/84 163/77 181/86 163/69  Pulse: 108 112 111 117  Temp:   98.4 F (36.9 C) 99.2 F (37.3 C)  TempSrc:   Oral Oral  Resp: 30 31 20 20   Height:      Weight:   76.1 kg (167 lb 12.3 oz)   SpO2: 95% 100% 95% 93%    General: AAOx3 Cardiovascular: S1S2/RRR Respiratory: CTAB  Discharge Instructions  Discharge Orders   Future Orders Complete By Expires     Discharge instructions  As directed     Comments:      Renal Diabetic Diet    Increase activity slowly  As directed         Medication List    TAKE these medications       amLODipine 10 MG tablet  Commonly known as:  NORVASC  Take 10 mg by mouth daily with breakfast.     aspirin EC 81 MG tablet  Take 81 mg by mouth daily.     calcium acetate 667 MG capsule  Commonly known as:  PHOSLO  Take 3 capsules (2,001 mg total) by mouth 3 (three) times daily with meals.     furosemide 40 MG tablet  Commonly known as:  LASIX  Take 1 tablet (40 mg total) by mouth 2 (two) times daily.     HYDROcodone-acetaminophen 5-325 MG per tablet  Commonly known as:  NORCO/VICODIN  Take 1 tablet by mouth every 6 (six) hours as needed for pain.  insulin glargine 100 UNIT/ML injection  Commonly known as:  LANTUS  Inject 63 Units into the skin at bedtime.     labetalol 100 MG tablet  Commonly known as:  NORMODYNE  Take 200 mg by mouth 2 (two) times daily.     metoCLOPramide 5 MG tablet  Commonly known as:  REGLAN  Take 5 mg by mouth 4 (four) times daily.     multivitamin Tabs tablet  Take 1 tablet by mouth daily.     predniSONE 5 MG tablet  Commonly known as:  DELTASONE  Take 5 mg by mouth daily.     simvastatin 20 MG tablet  Commonly known as:  ZOCOR  Take 20 mg by mouth every Monday, Wednesday, and Friday.     tacrolimus 1 MG capsule  Commonly known as:  PROGRAF  Take 2 capsules (2 mg total) by mouth at bedtime.            Follow-up Information   Follow up with PANG,RICHARD, MD In 1 week.   Contact information:   [address] [place], [address] [place] [address] [phone]        The results of significant diagnostics from this hospitalization (including imaging, microbiology, ancillary and laboratory) are listed below for reference.    Significant Diagnostic Studies: Dg Chest 2 View  01/28/2013  *RADIOLOGY REPORT*  Clinical Data: Shortness of breath, post dialysis  CHEST - 2 VIEW  Comparison: 06/09/2012  Findings: Enlargement of cardiac silhouette with pulmonary vascular congestion. Perihilar infiltrates greater on right question pulmonary edema. Minimal peribronchial thickening. Minimal atelectasis left base. No significant pleural effusion, pneumothorax, no acute osseous findings.  IMPRESSION: Enlargement of cardiac silhouette with pulmonary vascular congestion and perihilar edema. Minimal left basilar atelectasis.   Original Report Authenticated By: Ulyses Southward, M.D.    Dg Chest Port 1 View  02/02/2013  *RADIOLOGY REPORT*  Clinical Data: Chest pain.  Short of breath.  Dialysis.  PORTABLE CHEST - 1 VIEW  Comparison: 01/27/2013.  Findings: Findings remain compatible with volume overload. Cardiomegaly, interstitial and mild alveolar edema and cephalization of pulmonary blood flow. Monitoring leads are projected over the chest.  Probable small left pleural effusion.  IMPRESSION: Volume overload/CHF.   Original Report Authenticated By: Andreas Newport, M.D.     Microbiology: Recent Results (from the past 240 hour(s))  MRSA PCR SCREENING     Status: None   Collection Time    02/02/13  3:19 AM      Result Value Range Status   MRSA by PCR NEGATIVE  NEGATIVE Final   Comment:            The GeneXpert MRSA Assay (FDA     approved for NASAL specimens     only), is one component of a     comprehensive MRSA colonization     surveillance program. It is not     intended to diagnose MRSA     infection nor to  guide or     monitor treatment for     MRSA infections.     Labs: Basic Metabolic Panel:  Recent Labs Lab 01/27/13 1600 01/27/13 2128 01/28/13 0550 02/01/13 2342 02/02/13 02/02/13 0400 02/02/13 0528  NA 127*  --  136 120* 121* 123*  --   K 5.3*  --  5.6* 7.0* 6.9* 6.3* 4.4  CL 86*  --  96 82* 89* 83*  --   CO2 28  --  31 26  --  25  --  GLUCOSE 70  --  109* 146* 148* 133*  --   BUN 40*  --  15 38* 37* 40*  --   CREATININE 7.19* 3.10* 4.39* 8.37* 8.80* 8.63*  --   CALCIUM 8.9  --  8.6 9.0  --  9.7  --   PHOS 9.3*  --   --   --   --   --   --    Liver Function Tests:  Recent Labs Lab 01/27/13 1600 01/28/13 0550  AST  --  25  ALT  --  15  ALKPHOS  --  40  BILITOT  --  0.3  PROT  --  7.4  ALBUMIN 2.7* 2.8*   No results found for this basename: LIPASE, AMYLASE,  in the last 168 hours No results found for this basename: AMMONIA,  in the last 168 hours CBC:  Recent Labs Lab 01/27/13 1600 01/27/13 2128 02/01/13 2342 02/02/13 02/02/13 0400  WBC 7.6 6.7 12.6*  --  11.4*  HGB 7.1* 8.7* 8.9* 8.8* 8.6*  HCT 20.8* 25.4* 25.4* 26.0* 24.8*  MCV 87.4 87.6 84.9  --  84.6  PLT 271 263 239  --  265   Cardiac Enzymes: No results found for this basename: CKTOTAL, CKMB, CKMBINDEX, TROPONINI,  in the last 168 hours BNP: BNP (last 3 results)  Recent Labs  02/01/13 2344  PROBNP 58488.0*   CBG:  Recent Labs Lab 01/28/13 0023 01/28/13 0749 01/28/13 1159 01/28/13 1710 02/02/13 0803  GLUCAP 119* 178* 85 86 112*       Signed:  Donnalynn Wheeless  [facility] 02/02/2013, 3:25 PM

## 2013-02-03 LAB — GLUCOSE, CAPILLARY: Glucose-Capillary: 102 mg/dL — ABNORMAL HIGH (ref 70–99)

## 2013-02-05 LAB — BASIC METABOLIC PANEL
BUN: 38 mg/dL — ABNORMAL HIGH (ref 6–23)
CO2: 26 mEq/L (ref 19–32)
Calcium: 9 mg/dL (ref 8.4–10.5)
Chloride: 82 mEq/L — ABNORMAL LOW (ref 96–112)
Creatinine, Ser: 8.37 mg/dL — ABNORMAL HIGH (ref 0.50–1.10)
GFR calc Af Amer: 6 mL/min — ABNORMAL LOW (ref >90)
GFR calc non Af Amer: 5 mL/min — ABNORMAL LOW (ref >90)
Glucose, Bld: 146 mg/dL — ABNORMAL HIGH (ref 70–99)
Potassium: 7 mEq/L (ref 3.5–5.1)
Sodium: 120 mEq/L — ABNORMAL LOW (ref 135–145)

## 2013-02-07 ENCOUNTER — Encounter (HOSPITAL_COMMUNITY): Payer: Self-pay | Admitting: Emergency Medicine

## 2013-02-07 ENCOUNTER — Emergency Department (HOSPITAL_COMMUNITY): Payer: Medicare Other

## 2013-02-07 ENCOUNTER — Emergency Department (HOSPITAL_COMMUNITY)
Admission: EM | Admit: 2013-02-07 | Discharge: 2013-02-08 | Discharge status: Against Medical Advice | Payer: Medicare Other | Attending: Emergency Medicine | Admitting: Emergency Medicine

## 2013-02-07 DIAGNOSIS — I10 Essential (primary) hypertension: Secondary | ICD-10-CM | POA: Insufficient documentation

## 2013-02-07 DIAGNOSIS — J189 Pneumonia, unspecified organism: Secondary | ICD-10-CM | POA: Insufficient documentation

## 2013-02-07 DIAGNOSIS — Z940 Kidney transplant status: Secondary | ICD-10-CM | POA: Insufficient documentation

## 2013-02-07 DIAGNOSIS — R05 Cough: Secondary | ICD-10-CM | POA: Insufficient documentation

## 2013-02-07 DIAGNOSIS — R0602 Shortness of breath: Secondary | ICD-10-CM

## 2013-02-07 DIAGNOSIS — A150 Tuberculosis of lung: Secondary | ICD-10-CM | POA: Insufficient documentation

## 2013-02-07 DIAGNOSIS — E875 Hyperkalemia: Secondary | ICD-10-CM | POA: Insufficient documentation

## 2013-02-07 DIAGNOSIS — Z9189 Other specified personal risk factors, not elsewhere classified: Secondary | ICD-10-CM | POA: Insufficient documentation

## 2013-02-07 DIAGNOSIS — D649 Anemia, unspecified: Secondary | ICD-10-CM | POA: Insufficient documentation

## 2013-02-07 DIAGNOSIS — E11319 Type 2 diabetes mellitus with unspecified diabetic retinopathy without macular edema: Secondary | ICD-10-CM | POA: Insufficient documentation

## 2013-02-07 DIAGNOSIS — N186 End stage renal disease: Secondary | ICD-10-CM | POA: Insufficient documentation

## 2013-02-07 DIAGNOSIS — E119 Type 2 diabetes mellitus without complications: Secondary | ICD-10-CM | POA: Insufficient documentation

## 2013-02-07 DIAGNOSIS — R059 Cough, unspecified: Secondary | ICD-10-CM | POA: Insufficient documentation

## 2013-02-07 DIAGNOSIS — E785 Hyperlipidemia, unspecified: Secondary | ICD-10-CM | POA: Insufficient documentation

## 2013-02-07 LAB — CBC WITH DIFFERENTIAL/PLATELET
Basophils Absolute: 0.1 10*3/uL (ref 0.0–0.1)
Basophils Relative: 0 % (ref 0–1)
Eosinophils Relative: 4 % (ref 0–5)
HCT: 21.6 % — ABNORMAL LOW (ref 36.0–46.0)
MCHC: 34.7 g/dL (ref 30.0–36.0)
MCV: 85.7 fL (ref 78.0–100.0)
Monocytes Absolute: 0.8 10*3/uL (ref 0.1–1.0)
RDW: 15.3 % (ref 11.5–15.5)

## 2013-02-07 LAB — COMPREHENSIVE METABOLIC PANEL
AST: 17 U/L (ref 0–37)
Albumin: 2.7 g/dL — ABNORMAL LOW (ref 3.5–5.2)
Calcium: 9.7 mg/dL (ref 8.4–10.5)
Creatinine, Ser: 7.63 mg/dL — ABNORMAL HIGH (ref 0.50–1.10)

## 2013-02-07 NOTE — ED Notes (Signed)
PT. REPORTS SOB WITH PRODUCTIVE COUGH ONSET YESTERDAY  , DENIES CHEST PAIN , HEMODIALYSIS TUES/THURS/SAT.

## 2013-02-08 LAB — POCT I-STAT TROPONIN I: Troponin i, poc: 0.03 ng/mL (ref 0.00–0.08)

## 2013-02-08 MED ORDER — SODIUM POLYSTYRENE SULFONATE 15 GM/60ML PO SUSP
30.0000 g | Freq: Once | ORAL | Status: AC
  Administered 2013-02-08: 30 g via ORAL
  Filled 2013-02-08: qty 120

## 2013-02-08 MED ORDER — SODIUM CHLORIDE 0.9 % IV SOLN
INTRAVENOUS | Status: DC
  Administered 2013-02-08: 02:00:00 via INTRAVENOUS

## 2013-02-08 NOTE — ED Notes (Addendum)
Pt stating she wants to leave right now- unhooking herself from monitor. Rn explained to pt that the PA feels she needs to be admitted and that the admitting physician should be down to see her. PA made aware.

## 2013-02-08 NOTE — ED Notes (Signed)
Pt stated she is hungry and feeling like her blood sugar is low. CBG checked and is 85. Pt updated on wait time and delay.

## 2013-02-08 NOTE — ED Provider Notes (Signed)
Medical screening examination/treatment/procedure(s) were performed by non-physician practitioner and as supervising physician I was immediately available for consultation/collaboration.  John-Adam Octa Uplinger, M.D.     John-Adam Detra Bores, MD 02/08/13 0745 

## 2013-02-08 NOTE — ED Provider Notes (Signed)
History     CSN: [id number]  Arrival date & time 02/07/13  2149   First MD Initiated Contact with Patient 02/08/13 0016      Chief Complaint  Patient presents with  . Shortness of Breath  . Cough    (Consider location/radiation/quality/duration/timing/severity/associated sxs/prior treatment) HPI Comments: [name] is a 37 y/o F with PMHx of anemia, ESRD on dialysis, HTN, TB, HLD presenting to the ED with shortness of breathe that started yesterday. Patient reported that she was unable to sleep Saturday night due to shortness of breathe. Stated that she has been having difficulty breathing, stating that when she takes a deep breathe in it does not feel complete. Stated that shortness of breathe is worse when patient lays down, she reported feeling okay when sitting up. Patient on dialysis and receives dialysis every Tuesday, Thursday, and Saturday for 3h2min. Associated symptoms is cough. Denied fever, chills, sweating, chest pain, neck pain, back pain, gi symptoms, hematochezia, melena, headache, dizziness, weakness.   Patient recently in ED 02/01/2013 - was admitted for hyperkalemia, pulmonary edema, shortness of breathe.   The history is provided by the patient. No language interpreter was used.    Past Medical History  Diagnosis Date  . History of renal transplant 2010  . Diabetic retinopathy(362.0)   . HTN (hypertension)     not well controlled  . 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 HLD (hyperlipidemia)   . ESRD on dialysis 10/20/2008    Hx of pancreas-kidney transplant in 2010, started back on dialysis in Oct 2013. Back on insulin also due to failure of pancreatic graft.    [name] ESRD on dialysis     "[facility]; TTS; (01/27/2013)  . History of blood transfusion     "lots of times" (01/27/2013)  . CAP (community acquired pneumonia) 2010  . Shortness of breath     "w/dialysis"  (01/27/2013)  . Type II diabetes mellitus   . Anemia   . Ovarian tumor, malignant 2005    resected in [place] pt denies that this was cancer on 01/27/2013    Past Surgical History  Procedure Laterality Date  . Tumor removal  2005    Ovarian, resected in [place]  . Av fistula placement  06/03/2012    Procedure: INSERTION OF ARTERIOVENOUS (AV) GORE-TEX GRAFT ARM;  Surgeon: Larina Earthly, MD;  Location[facility] OR;  Service: Vascular;  Laterality: Left;  [name] Eye surgery Right     "cause of diabetes" (4/23/20140  . Combined kidney-pancreas transplant  2010    /notes 01/27/2013; @ [facility]  . 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 Renal biopsy  12/13/2010; 10/25/2011    [name] 01/27/2013    Family History  Problem Relation Age of Onset  . Diabetes Mother   . Liver cancer Father   . Cancer Father     History  Substance Use Topics  . Smoking status: Never Smoker   . Smokeless tobacco: Never Used  . Alcohol Use: No    OB History   Grav Para Term Preterm Abortions TAB SAB Ect Mult Living                  Review of Systems  Constitutional: Negative for fever, chills and fatigue.  HENT: Negative for ear pain, congestion, sore throat, trouble swallowing, neck pain and  neck stiffness.   Eyes: Negative for photophobia, pain and visual disturbance.  Respiratory: Positive for cough and shortness of breath. Negative for chest tightness.   Cardiovascular: Negative for chest pain and leg swelling.  Gastrointestinal: Negative for nausea, vomiting, abdominal pain, diarrhea, constipation and blood in stool.  Musculoskeletal: Negative for back pain and arthralgias.  Skin: Negative for color change and rash.  All other systems reviewed and are negative.    Allergies  Ace inhibitors and Alemtuzumab  Home Medications   Current Outpatient Rx  Name  Route  Sig  Dispense  Refill  . amLODipine (NORVASC) 10 MG tablet   Oral   Take 10  mg by mouth daily with breakfast.          . aspirin EC 81 MG tablet   Oral   Take 81 mg by mouth daily.         . calcium acetate (PHOSLO) 667 MG capsule   Oral   Take 3 capsules (2,001 mg total) by mouth 3 (three) times daily with meals.         . furosemide (LASIX) 40 MG tablet   Oral   Take 1 tablet (40 mg total) by mouth 2 (two) times daily.   60 tablet   0   . HYDROcodone-acetaminophen (NORCO/VICODIN) 5-325 MG per tablet   Oral   Take 1 tablet by mouth every 6 (six) hours as needed for pain.   15 tablet   0   . insulin glargine (LANTUS) 100 UNIT/ML injection   Subcutaneous   Inject 63 Units into the skin at bedtime.          [name] labetalol (NORMODYNE) 100 MG tablet   Oral   Take 200 mg by mouth 2 (two) times daily.          . metoCLOPramide (REGLAN) 5 MG tablet   Oral   Take 5 mg by mouth 4 (four) times daily.         . multivitamin (RENA-VIT) TABS tablet   Oral   Take 1 tablet by mouth daily.         . predniSONE (DELTASONE) 5 MG tablet   Oral   Take 5 mg by mouth daily.          . simvastatin (ZOCOR) 20 MG tablet   Oral   Take 20 mg by mouth every Monday, Wednesday, and Friday.         . tacrolimus (PROGRAF) 1 MG capsule   Oral   Take 2 capsules (2 mg total) by mouth at bedtime.           BP 159/75  Pulse 94  Temp(Src) 98.4 F (36.9 C)  Resp 18  SpO2 98%  Physical Exam  Nursing note and vitals reviewed. Constitutional: She is oriented to person, place, and time. She appears well-developed and well-nourished. No distress.  HENT:  Head: Normocephalic and atraumatic.  Mouth/Throat: Oropharynx is clear and moist. No oropharyngeal exudate.  Eyes: Conjunctivae and EOM are normal. Pupils are equal, round, and reactive to light. Right eye exhibits no discharge. Left eye exhibits no discharge.  Neck: Normal range of motion. Neck supple. No tracheal deviation present.  Negative nuchal rigidity Negative neck stiffness Negative  lymphadenopathy  Cardiovascular: Normal rate, regular rhythm and normal heart sounds.  Exam reveals no friction rub.   No murmur heard. Radial pulses 2+ bilaterally Pedal pulses 2+ bilaterally Negative leg and ankle swelling Negative pitting edema  Pulmonary/Chest: Effort normal. No respiratory  distress. She has no wheezes. She has no rales. She exhibits no tenderness.  Musculoskeletal: Normal range of motion. She exhibits no edema and no tenderness.       Arms: Lymphadenopathy:    She has no cervical adenopathy.  Neurological: She is alert and oriented to person, place, and time. No cranial nerve deficit. She exhibits normal muscle tone. Coordination normal.  Cranial nerves III-XII grossly intact  Skin: Skin is warm and dry. She is not diaphoretic. No erythema.  Psychiatric: She has a normal mood and affect. Her behavior is normal. Thought content normal.    ED Course  Procedures (including critical care time)  Labs Reviewed  CBC WITH DIFFERENTIAL - Abnormal; Notable for the following:    WBC 13.4 (*)    RBC 2.52 (*)    Hemoglobin 7.5 (*)    HCT 21.6 (*)    Neutrophils Relative 78 (*)    Neutro Abs 10.4 (*)    All other components within normal limits  COMPREHENSIVE METABOLIC PANEL - Abnormal; Notable for the following:    Sodium 127 (*)    Potassium 5.4 (*)    Chloride 85 (*)    Glucose, Bld 139 (*)    BUN 49 (*)    Creatinine, Ser 7.63 (*)    Albumin 2.7 (*)    GFR calc non Af Amer 6 (*)    GFR calc Af Amer 7 (*)    All other components within normal limits  GLUCOSE, CAPILLARY   Dg Chest 2 View  02/07/2013  *RADIOLOGY REPORT*  Clinical Data: 37 year old female with cough and shortness of breath.  "History of miliary tuberculosis."  CHEST - 2 VIEW  Comparison: 02/01/2013 and earlier.  Findings: Stable cardiomegaly and mediastinal contours.  Visualized tracheal air column is within normal limits.  No pneumothorax. There is some pleural thickening (or less likely chronic  small loculated pleural effusion) in the left lung visible at the apex. Continued basilar predominant streaky and indistinct pulmonary opacity.  Overall ventilation has mildly improved since 02/01/2013. Stable visualized osseous structures.  Mild chronic appearing wedging of a lower thoracic levels.  IMPRESSION: 1.  Mildly improved ventilation since 02/01/2013 with continued basilar predominant streaky and indistinct pulmonary opacity which is nonspecific, and could reflect bilateral lung infection or volume overload with atelectasis. 2.  Stable cardiomegaly. 3.  Evidence of chronic pleural thickening in the left lung including at the apex.   Original Report Authenticated By: Erskine Speed, M.D.      No diagnosis found.    MDM  I personally examined and evaluated the patient. Presenting with shortness of breathe x 1 day. Patient was recently seen in the ED for similar symptoms - 02/01/2013 - was admitted to the hospital for hyperkalemia and pulmonary edema.  CMP - hyponatremia (127), hyperkalemia (5.4) - Kayexalate suspension 30 g given. CBC elevated WBC (13.4), lowered RBC, Hgb and Hct - trend seen in patient. Chest xray noted stable cardiomegaly, chronic pleural thickening at apex - pulmonary opacity noted.  Discussed case with Ruby Cola, PA-C. EKG and Troponins ordered. Consult for [facility] ordered for admission for hyponatremia, hyperkalemia, shortness of breathe, ESRD on dialysis - awaiting call. Transfer of care to [employer], PA-C at 2:35AM 02/08/2013.         Raymon Mutton, PA-C 02/08/13 1415

## 2013-02-08 NOTE — ED Notes (Signed)
In updated on plan of care that she will be admitted to the hospital.

## 2013-02-08 NOTE — ED Notes (Signed)
PA has spoke with pt as well as this Char[employer] fundraiser. Pt continues to refuse further treatment and does not wish to be admitted. This RN explain the risks of leaving including death. Pt verbalized understanding and signed out AMA. Pt ambulatory.

## 2013-02-08 NOTE — ED Notes (Signed)
Pt given ice chips- OK by Dr. Rulon Abide

## 2013-02-08 NOTE — ED Provider Notes (Signed)
Pt received from [place], PA-C.  Hemodialysis pt to be admitted for SOB and hyperkalemia.  Recent admission for emergent dialysis for pulmonary edema and hyperkalemia.  [facility] consulted for admission and nephrology for dialysis.  Pt became agitated because of her weight and pulled her IV out and leads off.  I tried to convince her to stay.  She understands the possible consequences of  leaving, including worsening symptoms and death.     Otilio Miu, PA-C 02/08/13 [phone]

## 2013-02-08 NOTE — ED Provider Notes (Signed)
Medical screening examination/treatment/procedure(s) were performed by non-physician practitioner and as supervising physician I was immediately available for consultation/collaboration.  John-Adam Jaleal Schliep, M.D.     John-Adam Trynity Skousen, MD 02/08/13 2254 

## 2013-03-27 ENCOUNTER — Emergency Department (HOSPITAL_COMMUNITY)
Admission: EM | Admit: 2013-03-27 | Discharge: 2013-03-27 | Disposition: A | Discharge status: Home / Self Care | Payer: Medicare Other | Attending: Emergency Medicine | Admitting: Emergency Medicine

## 2013-03-27 ENCOUNTER — Encounter (HOSPITAL_COMMUNITY): Payer: Self-pay | Admitting: Emergency Medicine

## 2013-03-27 DIAGNOSIS — I129 Hypertensive chronic kidney disease with stage 1 through stage 4 chronic kidney disease, or unspecified chronic kidney disease: Secondary | ICD-10-CM | POA: Insufficient documentation

## 2013-03-27 DIAGNOSIS — N189 Chronic kidney disease, unspecified: Secondary | ICD-10-CM

## 2013-03-27 DIAGNOSIS — Z8543 Personal history of malignant neoplasm of ovary: Secondary | ICD-10-CM | POA: Insufficient documentation

## 2013-03-27 DIAGNOSIS — Z992 Dependence on renal dialysis: Secondary | ICD-10-CM | POA: Insufficient documentation

## 2013-03-27 DIAGNOSIS — Z8619 Personal history of other infectious and parasitic diseases: Secondary | ICD-10-CM | POA: Insufficient documentation

## 2013-03-27 DIAGNOSIS — Z7982 Long term (current) use of aspirin: Secondary | ICD-10-CM | POA: Insufficient documentation

## 2013-03-27 DIAGNOSIS — I120 Hypertensive chronic kidney disease with stage 5 chronic kidney disease or end stage renal disease: Secondary | ICD-10-CM | POA: Insufficient documentation

## 2013-03-27 DIAGNOSIS — Z79899 Other long term (current) drug therapy: Secondary | ICD-10-CM | POA: Insufficient documentation

## 2013-03-27 DIAGNOSIS — N186 End stage renal disease: Secondary | ICD-10-CM | POA: Insufficient documentation

## 2013-03-27 DIAGNOSIS — E1139 Type 2 diabetes mellitus with other diabetic ophthalmic complication: Secondary | ICD-10-CM | POA: Insufficient documentation

## 2013-03-27 DIAGNOSIS — E11319 Type 2 diabetes mellitus with unspecified diabetic retinopathy without macular edema: Secondary | ICD-10-CM | POA: Insufficient documentation

## 2013-03-27 DIAGNOSIS — Z940 Kidney transplant status: Secondary | ICD-10-CM | POA: Insufficient documentation

## 2013-03-27 DIAGNOSIS — Z8701 Personal history of pneumonia (recurrent): Secondary | ICD-10-CM | POA: Insufficient documentation

## 2013-03-27 DIAGNOSIS — D631 Anemia in chronic kidney disease: Secondary | ICD-10-CM | POA: Insufficient documentation

## 2013-03-27 DIAGNOSIS — E785 Hyperlipidemia, unspecified: Secondary | ICD-10-CM | POA: Insufficient documentation

## 2013-03-27 LAB — CBC WITH DIFFERENTIAL/PLATELET
Basophils Relative: 0 % (ref 0–1)
Hemoglobin: 7.2 g/dL — ABNORMAL LOW (ref 12.0–15.0)
MCHC: 33.8 g/dL (ref 30.0–36.0)
Monocytes Relative: 5 % (ref 3–12)
Neutro Abs: 12.9 10*3/uL — ABNORMAL HIGH (ref 1.7–7.7)
Neutrophils Relative %: 86 % — ABNORMAL HIGH (ref 43–77)
RBC: 2.42 MIL/uL — ABNORMAL LOW (ref 3.87–5.11)

## 2013-03-27 LAB — BASIC METABOLIC PANEL
BUN: 22 mg/dL (ref 6–23)
Chloride: 89 mEq/L — ABNORMAL LOW (ref 96–112)
GFR calc Af Amer: 22 mL/min — ABNORMAL LOW (ref >90)
Potassium: 3.6 mEq/L (ref 3.5–5.1)

## 2013-03-27 MED ORDER — DIPHENHYDRAMINE HCL 50 MG/ML IJ SOLN
50.0000 mg | Freq: Once | INTRAMUSCULAR | Status: AC
  Administered 2013-03-27: 50 mg via INTRAVENOUS
  Filled 2013-03-27: qty 1

## 2013-03-27 MED ORDER — ACETAMINOPHEN 325 MG PO TABS
650.0000 mg | ORAL_TABLET | Freq: Once | ORAL | Status: AC
  Administered 2013-03-27: 650 mg via ORAL
  Filled 2013-03-27: qty 2

## 2013-03-27 MED ORDER — LABETALOL HCL 200 MG PO TABS
200.0000 mg | ORAL_TABLET | Freq: Two times a day (BID) | ORAL | Status: DC
Start: 2013-03-27 — End: 2013-03-27
  Administered 2013-03-27: 200 mg via ORAL
  Filled 2013-03-27: qty 1

## 2013-03-27 MED ORDER — AMLODIPINE BESYLATE 10 MG PO TABS
10.0000 mg | ORAL_TABLET | Freq: Every day | ORAL | Status: DC
  Administered 2013-03-27: 10 mg via ORAL
  Filled 2013-03-27: qty 1

## 2013-03-27 NOTE — ED Notes (Signed)
Pt arrived to ED from dialysis center d/t hgb of 6.2.

## 2013-03-27 NOTE — ED Notes (Signed)
VS rechecked incorrect data

## 2013-03-27 NOTE — ED Provider Notes (Signed)
History     CSN: [id number]  Arrival date & time 03/27/13  1130   First MD Initiated Contact with Patient 03/27/13 1140      Chief Complaint  Patient presents with  . 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 She denies any SOB or weakness. Per notes from HD, Short Stay was booked until Wednesday and so she was sent to the ED for transfusion. She has had episodes of anemia before, presumed to be due to chronic renal disease. She also reports she has been to [facility] recently for pneumonia with ?effusion. She has seen Hem/Onc there as well for the anemia and what she describes as large lymph nodes in her chest.She had PET scan last week, does not know results. She completed her dialysis session today.   Past Medical History  Diagnosis Date  . History of renal transplant 2010  . Diabetic retinopathy   . HTN (hypertension)     not well controlled  . 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 HLD (hyperlipidemia)   . ESRD on dialysis 10/20/2008    Hx of pancreas-kidney transplant in 2010, started back on dialysis in Oct 2013. Back on insulin also due to failure of pancreatic graft.    [name] ESRD on dialysis     "[facility]; TTS; (01/27/2013)  . History of blood transfusion     "lots of times" (01/27/2013)  . CAP (community acquired pneumonia) 2010  . Shortness of breath     "w/dialysis" (01/27/2013)  . Type II diabetes mellitus   . Anemia   . Ovarian tumor, malignant 2005    resected in [place] pt denies that this was cancer on 01/27/2013    Past Surgical History  Procedure Laterality Date  . Tumor removal  2005    Ovarian, resected in [place]  . Av fistula placement  06/03/2012    Procedure: INSERTION OF ARTERIOVENOUS  (AV) GORE-TEX GRAFT ARM;  Surgeon: Larina Earthly, MD;  Location: [facility] OR;  Service: Vascular;  Laterality: Left;  [name] Eye surgery Right     "cause of diabetes" (4/23/20140  . Combined kidney-pancreas transplant  2010    /notes 01/27/2013; @ [facility]  . 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 Renal biopsy  12/13/2010; 10/25/2011    [name] 01/27/2013    Family History  Problem Relation Age of Onset  . Diabetes Mother   . Liver cancer Father   . Cancer Father     History  Substance Use Topics  . Smoking status: Never Smoker   . Smokeless tobacco: Never Used  . Alcohol Use: No    OB History   Grav Para Term Preterm Abortions TAB SAB Ect Mult Living                  Review of Systems All other systems reviewed and are negative except as noted in HPI.   Allergies  Ace inhibitors and Alemtuzumab  Home Medications   Current Outpatient Rx  Name  Route  Sig  Dispense  Refill  . amLODipine (NORVASC) 10 MG tablet   Oral   Take 10 mg by mouth daily with breakfast.          .  aspirin EC 81 MG tablet   Oral   Take 81 mg by mouth daily.         . calcium acetate (PHOSLO) 667 MG capsule   Oral   Take 3 capsules (2,001 mg total) by mouth 3 (three) times daily with meals.         . furosemide (LASIX) 40 MG tablet   Oral   Take 1 tablet (40 mg total) by mouth 2 (two) times daily.   60 tablet   0   . HYDROcodone-acetaminophen (NORCO/VICODIN) 5-325 MG per tablet   Oral   Take 1 tablet by mouth every 6 (six) hours as needed for pain.   15 tablet   0   . insulin glargine (LANTUS) 100 UNIT/ML injection   Subcutaneous   Inject 63 Units into the skin at bedtime.          [name] labetalol (NORMODYNE) 100 MG tablet   Oral   Take 200 mg by mouth 2 (two) times daily.          . metoCLOPramide (REGLAN) 5 MG tablet   Oral   Take 5 mg by mouth 4 (four) times daily.         . multivitamin (RENA-VIT) TABS  tablet   Oral   Take 1 tablet by mouth daily.         . predniSONE (DELTASONE) 5 MG tablet   Oral   Take 5 mg by mouth daily.          . simvastatin (ZOCOR) 20 MG tablet   Oral   Take 20 mg by mouth every Monday, Wednesday, and Friday.         . tacrolimus (PROGRAF) 1 MG capsule   Oral   Take 2 capsules (2 mg total) by mouth at bedtime.           BP 134/56  Pulse 93  Temp(Src) 98.3 F (36.8 C)  Resp 18  SpO2 97%  Physical Exam  Nursing note and vitals reviewed. Constitutional: She is oriented to person, place, and time. She appears well-developed and well-nourished.  HENT:  Head: Normocephalic and atraumatic.  Eyes: EOM are normal. Pupils are equal, round, and reactive to light.  Neck: Normal range of motion. Neck supple.  Cardiovascular: Normal rate, normal heart sounds and intact distal pulses.   Pulmonary/Chest: Effort normal and breath sounds normal.  Abdominal: Bowel sounds are normal. She exhibits no distension. There is no tenderness.  Musculoskeletal: Normal range of motion. She exhibits no edema and no tenderness.  LUE dialysis fistula with thrill  Neurological: She is alert and oriented to person, place, and time. She has normal strength. No cranial nerve deficit or sensory deficit.  Skin: Skin is warm and dry. No rash noted.  Psychiatric: She has a normal mood and affect.    ED Course  Procedures (including critical care time)  Labs Reviewed  CBC WITH DIFFERENTIAL - Abnormal; Notable for the following:    WBC 15.1 (*)    RBC 2.42 (*)    Hemoglobin 7.2 (*)    HCT 21.3 (*)    RDW 15.7 (*)    Neutrophils Relative % 86 (*)    Neutro Abs 12.9 (*)    Lymphocytes Relative 8 (*)    All other components within normal limits  BASIC METABOLIC PANEL - Abnormal; Notable for the following:    Sodium 132 (*)    Chloride 89 (*)    Glucose, Bld 301 (*)  Creatinine, Ser 2.98 (*)    GFR calc non Af Amer 19 (*)    GFR calc Af Amer 22 (*)    All other  components within normal limits  TYPE AND SCREEN  PREPARE RBC (CROSSMATCH)   No results found.   1. Anemia of chronic kidney failure, unspecified stage       MDM  Will confirm Hgb, plan for ED transfusion. Pretreat with benadryl and APAP per Nephro recommendations.   6:31 PM Anemia not as severe here as reported at dialysis. Possibly some hemoconcentration s/p dialysis but still in need of transfusion. Two units ordered, she is getting her second now. Resting comfortably. Plan discharge after the second unit. Care signed out to Dr. Ranae Palms at the change of shift.     Charles B. Bernette Mayers, MD 03/27/13 [phone]

## 2013-03-27 NOTE — ED Notes (Signed)
Dr. Ranae Palms made aware of increase in Temp.

## 2013-03-27 NOTE — ED Notes (Signed)
PT comfortable with d/c and f/u instructions. No prescriptions. 

## 2013-03-28 LAB — TYPE AND SCREEN
ABO/RH(D): AB POS
Antibody Screen: NEGATIVE
Unit division: 0

## 2013-04-13 ENCOUNTER — Emergency Department (HOSPITAL_COMMUNITY)
Admission: EM | Admit: 2013-04-13 | Discharge: 2013-04-13 | Disposition: A | Discharge status: Home / Self Care | Payer: Medicare Other | Attending: Emergency Medicine | Admitting: Emergency Medicine

## 2013-04-13 ENCOUNTER — Encounter (HOSPITAL_COMMUNITY): Payer: Self-pay | Admitting: Emergency Medicine

## 2013-04-13 DIAGNOSIS — IMO0002 Reserved for concepts with insufficient information to code with codable children: Secondary | ICD-10-CM | POA: Insufficient documentation

## 2013-04-13 DIAGNOSIS — Z8543 Personal history of malignant neoplasm of ovary: Secondary | ICD-10-CM | POA: Insufficient documentation

## 2013-04-13 DIAGNOSIS — Z8611 Personal history of tuberculosis: Secondary | ICD-10-CM | POA: Insufficient documentation

## 2013-04-13 DIAGNOSIS — Z7982 Long term (current) use of aspirin: Secondary | ICD-10-CM | POA: Insufficient documentation

## 2013-04-13 DIAGNOSIS — N186 End stage renal disease: Secondary | ICD-10-CM

## 2013-04-13 DIAGNOSIS — I10 Essential (primary) hypertension: Secondary | ICD-10-CM | POA: Insufficient documentation

## 2013-04-13 DIAGNOSIS — E1149 Type 2 diabetes mellitus with other diabetic neurological complication: Secondary | ICD-10-CM | POA: Insufficient documentation

## 2013-04-13 DIAGNOSIS — Z794 Long term (current) use of insulin: Secondary | ICD-10-CM | POA: Insufficient documentation

## 2013-04-13 DIAGNOSIS — Z8701 Personal history of pneumonia (recurrent): Secondary | ICD-10-CM | POA: Insufficient documentation

## 2013-04-13 DIAGNOSIS — D649 Anemia, unspecified: Secondary | ICD-10-CM

## 2013-04-13 DIAGNOSIS — E785 Hyperlipidemia, unspecified: Secondary | ICD-10-CM | POA: Insufficient documentation

## 2013-04-13 DIAGNOSIS — Z992 Dependence on renal dialysis: Secondary | ICD-10-CM | POA: Insufficient documentation

## 2013-04-13 DIAGNOSIS — Z79899 Other long term (current) drug therapy: Secondary | ICD-10-CM | POA: Insufficient documentation

## 2013-04-13 DIAGNOSIS — Z940 Kidney transplant status: Secondary | ICD-10-CM | POA: Insufficient documentation

## 2013-04-13 LAB — CBC
HCT: 21.3 % — ABNORMAL LOW (ref 36.0–46.0)
Hemoglobin: 7 g/dL — ABNORMAL LOW (ref 12.0–15.0)
MCH: 28.3 pg (ref 26.0–34.0)
MCHC: 32.9 g/dL (ref 30.0–36.0)
MCV: 86.2 fL (ref 78.0–100.0)
Platelets: 422 K/uL — ABNORMAL HIGH (ref 150–400)
RBC: 2.47 MIL/uL — ABNORMAL LOW (ref 3.87–5.11)
RDW: 14.6 % (ref 11.5–15.5)
WBC: 13.9 K/uL — ABNORMAL HIGH (ref 4.0–10.5)

## 2013-04-13 LAB — PREPARE RBC (CROSSMATCH)

## 2013-04-13 MED ORDER — ASPIRIN EC 81 MG PO TBEC
81.0000 mg | DELAYED_RELEASE_TABLET | Freq: Every day | ORAL | Status: DC
  Filled 2013-04-13: qty 1

## 2013-04-13 MED ORDER — METOCLOPRAMIDE HCL 5 MG PO TABS
5.0000 mg | ORAL_TABLET | Freq: Four times a day (QID) | ORAL | Status: DC
  Filled 2013-04-13: qty 1

## 2013-04-13 MED ORDER — CALCIUM ACETATE 667 MG PO CAPS
2001.0000 mg | ORAL_CAPSULE | Freq: Three times a day (TID) | ORAL | Status: DC
  Filled 2013-04-13: qty 3

## 2013-04-13 MED ORDER — LABETALOL HCL 200 MG PO TABS
200.0000 mg | ORAL_TABLET | Freq: Two times a day (BID) | ORAL | Status: DC
  Filled 2013-04-13: qty 1

## 2013-04-13 MED ORDER — INSULIN ASPART 100 UNIT/ML ~~LOC~~ SOLN: 12.0000 [IU] | Freq: Three times a day (TID) | SUBCUTANEOUS | Status: DC

## 2013-04-13 MED ORDER — TACROLIMUS 1 MG PO CAPS
1.0000 mg | ORAL_CAPSULE | Freq: Every day | ORAL | Status: DC
  Filled 2013-04-13: qty 1

## 2013-04-13 MED ORDER — AMLODIPINE BESYLATE 10 MG PO TABS
10.0000 mg | ORAL_TABLET | Freq: Every day | ORAL | Status: DC
  Filled 2013-04-13: qty 1

## 2013-04-13 MED ORDER — SIMVASTATIN 20 MG PO TABS: 20.0000 mg | ORAL_TABLET | ORAL | Status: DC

## 2013-04-13 MED ORDER — PREDNISONE 5 MG PO TABS
5.0000 mg | ORAL_TABLET | Freq: Every day | ORAL | Status: DC
  Filled 2013-04-13: qty 1

## 2013-04-13 MED ORDER — INSULIN GLARGINE 100 UNIT/ML ~~LOC~~ SOLN
63.0000 [IU] | Freq: Every day | SUBCUTANEOUS | Status: DC
  Filled 2013-04-13: qty 0.63

## 2013-04-13 NOTE — ED Notes (Signed)
Pt has no reaction to blood.Rate increased.

## 2013-04-13 NOTE — ED Notes (Signed)
Pt discharged.Vital signs stable and GCS 15 

## 2013-04-13 NOTE — ED Notes (Signed)
Pt presented to ED with complaint of feeling weak.She is a dialysis pt and has her dialysis 3 times a week.Pt had dialysis today and her HB was 6.9 as per her.Pt denies SOB.

## 2013-04-13 NOTE — ED Provider Notes (Signed)
History    CSN: [id number] Arrival date & time 04/13/13  1132  First MD Initiated Contact with Patient 04/13/13 1144     Chief Complaint  Patient presents with  . Fatigue   (Consider location/radiation/quality/duration/timing/severity/associated sxs/prior Treatment) HPI Comments: Pt with h/o ESRD on hemodialysis and had complete session today. Was told her Hgb was 6.9.  She reports generally if she gets in the 6 range, they feel she requires transfusion and she has had multiple times in the past.  Normally she is at 8-9 range.  She denies syncope, CP, SOB.  No fever, coughing.  No N/V/D.  No blood in stool.    Patient is a 37 y.o. female presenting with weakness. The history is provided by the patient and medical records.  Weakness This is a recurrent problem. The current episode started yesterday. The problem occurs constantly. The problem has not changed since onset.Pertinent negatives include no chest pain, no abdominal pain and no shortness of breath.   Past Medical History  Diagnosis Date  . History of renal transplant 2010  . Diabetic retinopathy   . HTN (hypertension)     not well controlled  . 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 HLD (hyperlipidemia)   . ESRD on dialysis 10/20/2008    Hx of pancreas-kidney transplant in 2010, started back on dialysis in Oct 2013. Back on insulin also due to failure of pancreatic graft.    [name] ESRD on dialysis     "[facility]; TTS; (01/27/2013)  . History of blood transfusion     "lots of times" (01/27/2013)  . CAP (community acquired pneumonia) 2010  . Shortness of breath     "w/dialysis" (01/27/2013)  . Type II diabetes mellitus   . Anemia   . Ovarian tumor, malignant 2005    resected in [place] pt denies that this was cancer on 01/27/2013   Past Surgical History  Procedure Laterality Date  . Tumor removal  2005    Ovarian, resected in [place]  .  Av fistula placement  06/03/2012    Procedure: INSERTION OF ARTERIOVENOUS (AV) GORE-TEX GRAFT ARM;  Surgeon: Larina Earthly, MD;  Location: [facility] OR;  Service: Vascular;  Laterality: Left;  [name] Eye surgery Right     "cause of diabetes" (4/23/20140  . Combined kidney-pancreas transplant  2010    /notes 01/27/2013; @ [facility]  . 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 Renal biopsy  12/13/2010; 10/25/2011    [name] 01/27/2013   Family History  Problem Relation Age of Onset  . Diabetes Mother   . Liver cancer Father   . Cancer Father    History  Substance Use Topics  . Smoking status: Never Smoker   . Smokeless tobacco: Never Used  . Alcohol Use: No   OB History   Grav Para Term Preterm Abortions TAB SAB Ect Mult Living                 Review of Systems  Constitutional: Negative for fever and chills.  Respiratory: Negative for cough and shortness of breath.   Cardiovascular: Negative for chest pain.  Gastrointestinal: Negative for nausea, vomiting, abdominal pain, diarrhea and blood in stool.  Neurological: Positive for weakness.  All other systems reviewed and are negative.    Allergies  Ace inhibitors  Home Medications  Current Outpatient Rx  Name  Route  Sig  Dispense  Refill  . amLODipine (NORVASC) 10 MG tablet   Oral   Take 10 mg by mouth at bedtime.          [name] aspirin EC 81 MG tablet   Oral   Take 81 mg by mouth at bedtime.          . calcium acetate (PHOSLO) 667 MG capsule   Oral   Take 3 capsules (2,001 mg total) by mouth 3 (three) times daily with meals.         . insulin aspart (NOVOLOG) 100 UNIT/ML injection   Subcutaneous   Inject 12 Units into the skin 3 (three) times daily with meals.         . insulin glargine (LANTUS) 100 UNIT/ML injection   Subcutaneous   Inject 63 Units into the skin at bedtime.          [name] labetalol (NORMODYNE) 100 MG tablet   Oral   Take 200 mg by mouth 2  (two) times daily.          . metoCLOPramide (REGLAN) 5 MG tablet   Oral   Take 5 mg by mouth 4 (four) times daily.         . predniSONE (DELTASONE) 5 MG tablet   Oral   Take 5 mg by mouth at bedtime.          . simvastatin (ZOCOR) 20 MG tablet   Oral   Take 20 mg by mouth every Monday, Wednesday, and Friday.         . tacrolimus (PROGRAF) 1 MG capsule   Oral   Take 1 mg by mouth at bedtime.           BP 157/91  Pulse 95  Temp(Src) 98.9 F (37.2 C) (Oral)  Resp 20  SpO2 97% Physical Exam  Nursing note and vitals reviewed. Constitutional: She appears well-developed and well-nourished. No distress.  HENT:  Head: Normocephalic and atraumatic.  Eyes: Conjunctivae and EOM are normal. No scleral icterus.  Neck: Normal range of motion. Neck supple.  Cardiovascular: Normal rate and intact distal pulses.   Pulmonary/Chest: Effort normal. No respiratory distress. She has no wheezes.  Abdominal: She exhibits no distension. There is no tenderness.  Neurological: She is alert.  Skin: Skin is warm and dry. No rash noted. She is not diaphoretic. No pallor.  Psychiatric: She has a normal mood and affect.    ED Course  Procedures (including critical care time) Labs Reviewed  CBC - Abnormal; Notable for the following:    WBC 13.9 (*)    RBC 2.47 (*)    Hemoglobin 7.0 (*)    HCT 21.3 (*)    Platelets 422 (*)    All other components within normal limits  TYPE AND SCREEN  PREPARE RBC (CROSSMATCH)  PREPARE RBC (CROSSMATCH)   No results found. 1. Anemia   2. End stage renal disease on dialysis      RA sat is 97% and I interpret to be adequate    Pt's Hgb was 7.0, will transfuse 2 units and pt can be discharged afterwards if no reaction occurs.  Pt currently is tolerating well.  Pt can follow up with PCP and/or hematologist tomorrow.    MDM  I reviewed prior labs and ED notes. Last visit on 03/27/13, had 2 units transfused in the ED adn released afterwards.  Will  check Hgb and consider transfusion would keep  in pod C, and once complete, if needed, can be discharged to home.  Pt reports she has an appt with hematology/oncology tomorrow.    Gavin Pound. Beni Turrell, MD 04/13/13 1551

## 2013-04-14 LAB — TYPE AND SCREEN
ABO/RH(D): AB POS
Antibody Screen: NEGATIVE
Unit division: 0
Unit division: 0

## 2013-04-24 IMAGING — CT CT BIOPSY
1 of 3 series · 14 of 32 positions shown, 19 images · non-contrast
Comparison: none

CT GUIDED RIGHT ILIAC BONE MARROW ASPIRATION AND BONE MARROW CORE
BIOPSIES

Date:
CLINICAL HISTORY: 35-year-old female with anemia.  Bone marrow
biopsy requested to facilitate diagnosis.

[Series 2: — · axial · 0.70mm/px · z∈[-84,+21]mm · 14 of 32 slices shown, 19 images]
[im 2/32  soft-tissue]
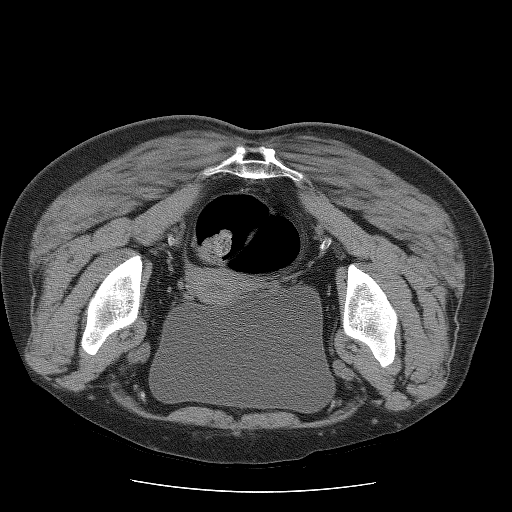
[im 2/32  bone]
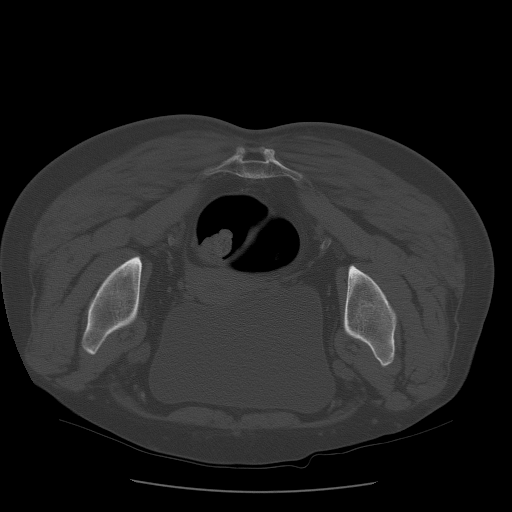
[im 5/32  soft-tissue]
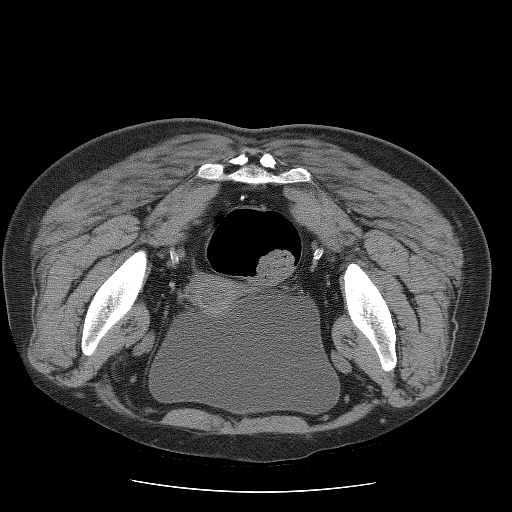
[im 7/32  soft-tissue]
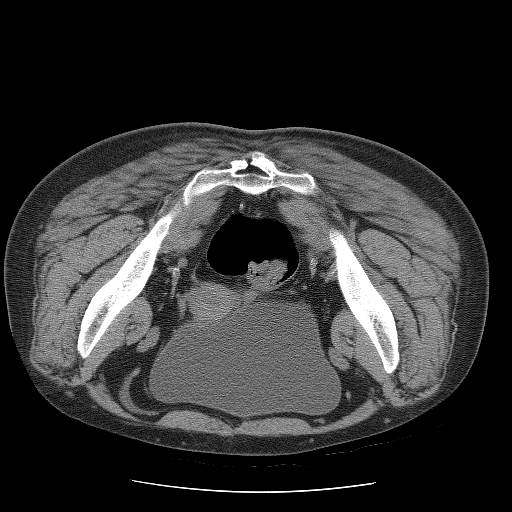
[im 9/32  soft-tissue]
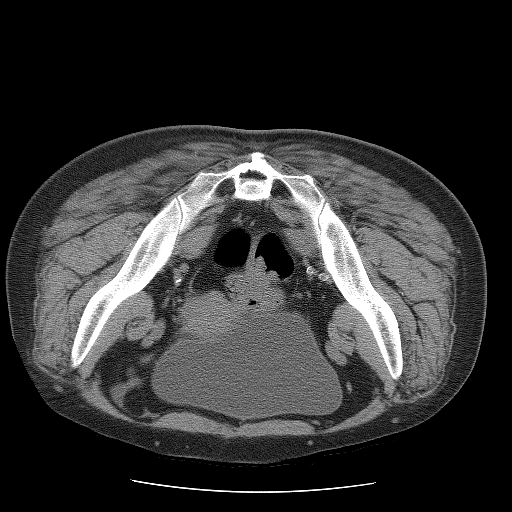
[im 12/32  soft-tissue]
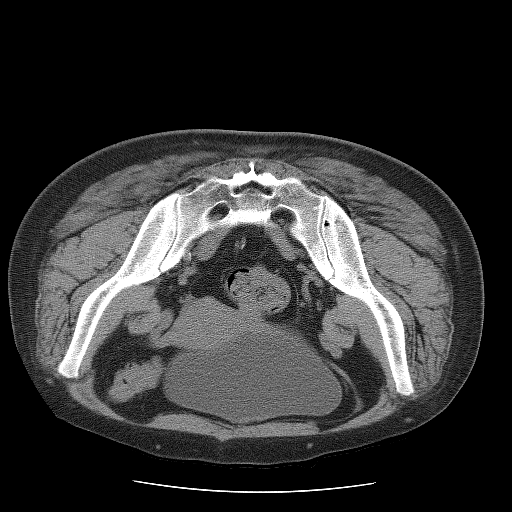
[im 14/32  soft-tissue]
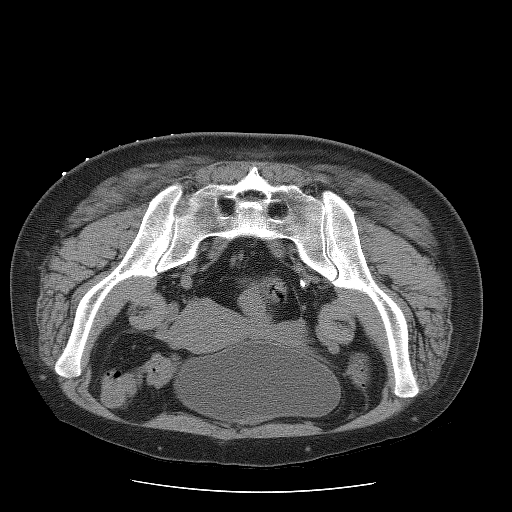
[im 17/32  soft-tissue]
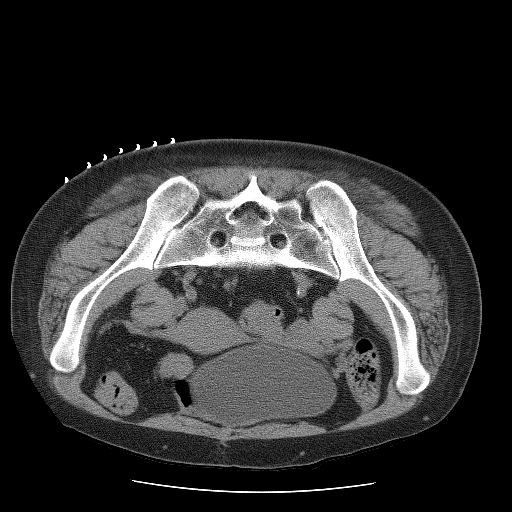
[im 18/32  soft-tissue]
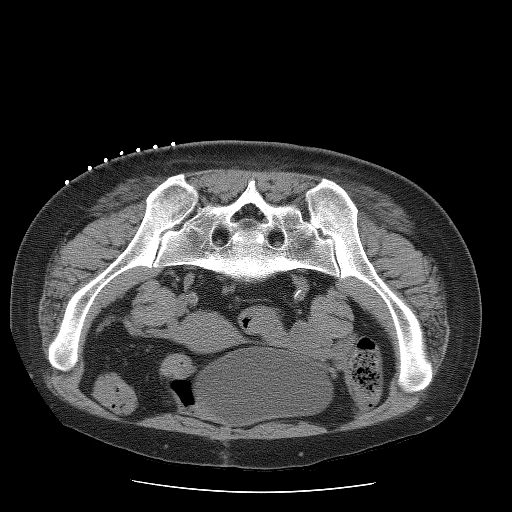
[im 20/32  soft-tissue]
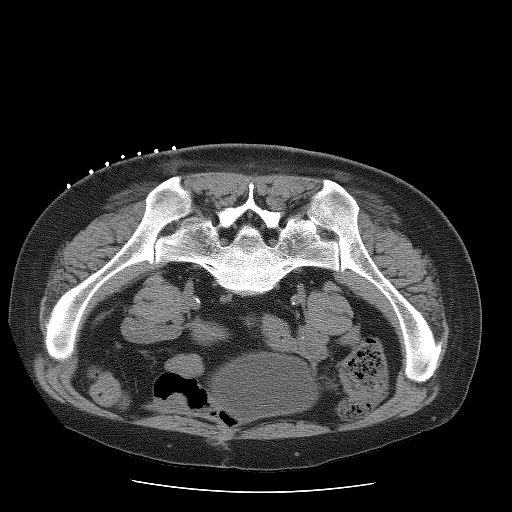
[im 20/32  bone]
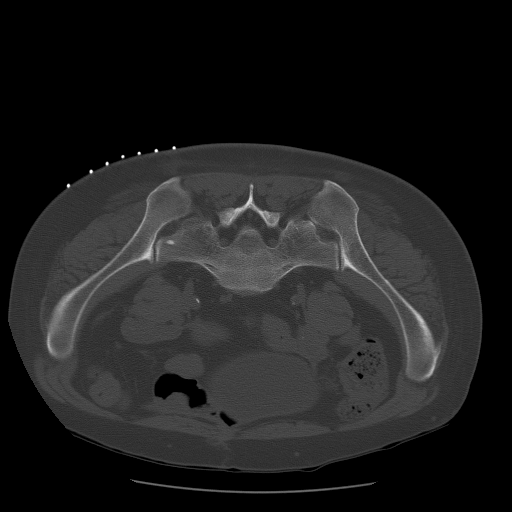
[im 23/32  soft-tissue]
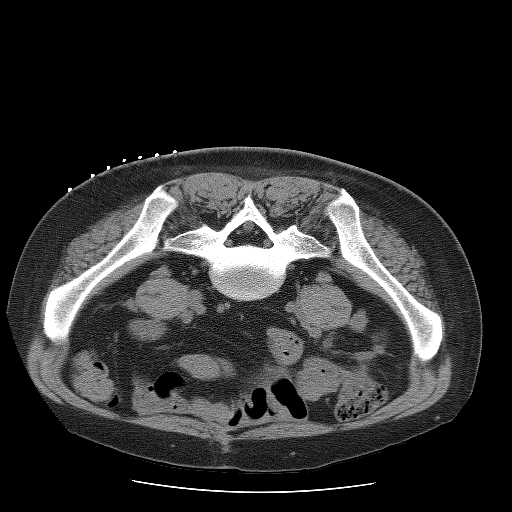
[im 25/32  soft-tissue]
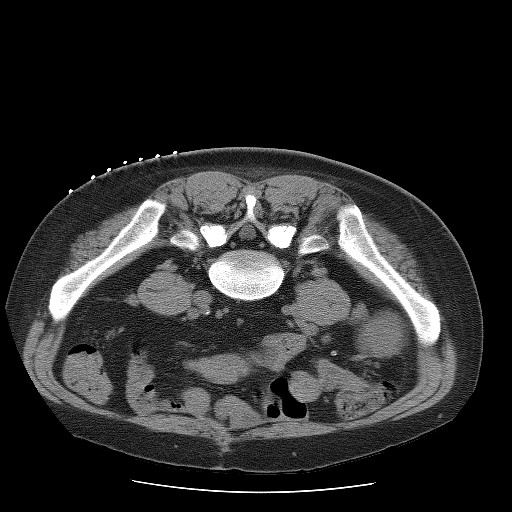
[im 25/32  lung]
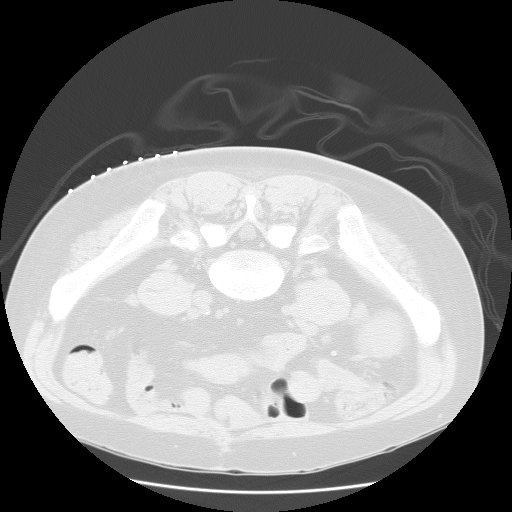
[im 27/32  soft-tissue]
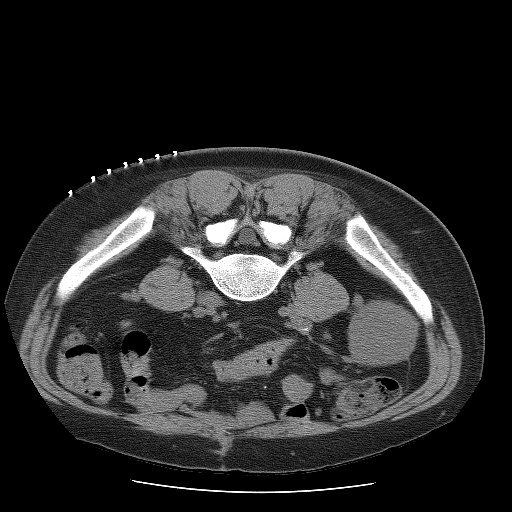
[im 27/32  lung]
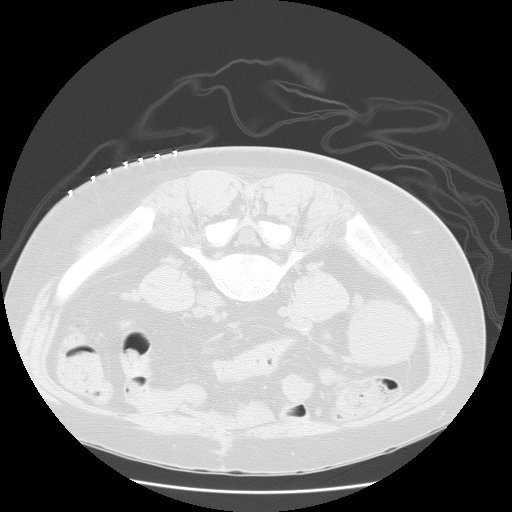
[im 28/32  lung]
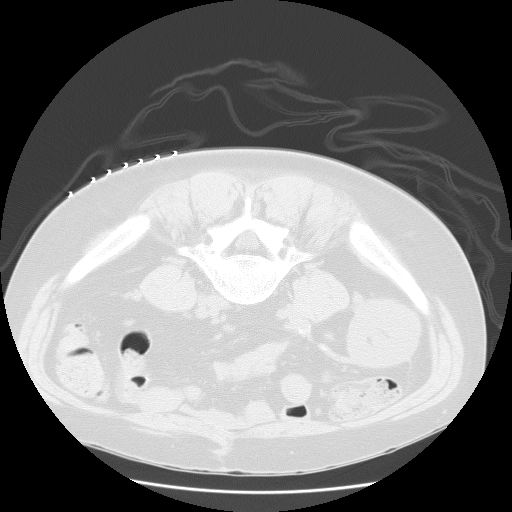
[im 30/32  soft-tissue]
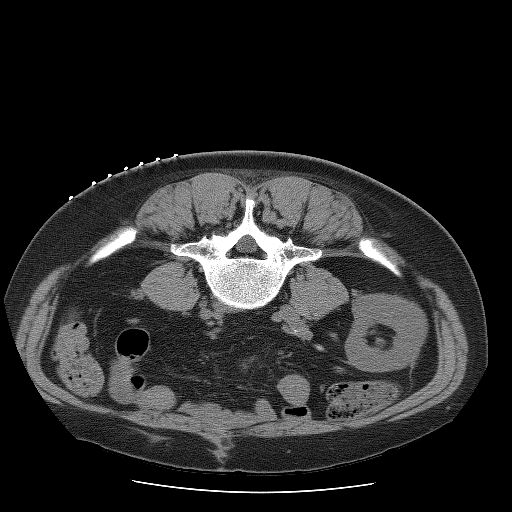
[im 30/32  lung]
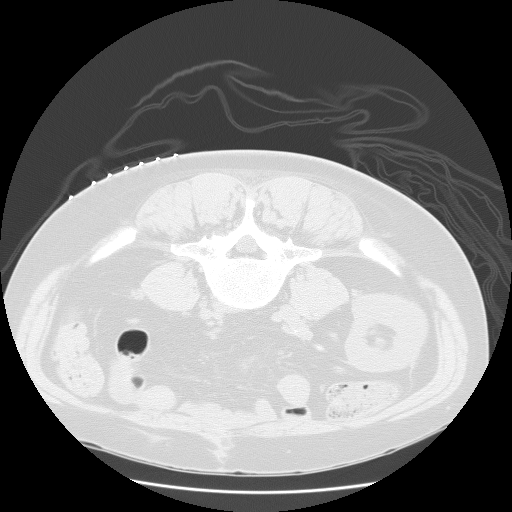

[14 of 32 positions shown; findings below may reference images not displayed]

Procedures Performed:

1. CT guided bone marrow aspiration and core biopsy

Sedation: Moderate (conscious) sedation was used.  2 mg Versed, 75
mcg Fentanyl were administered intravenously.  The patient's vital
signs were monitored continuously by radiology nursing throughout
the procedure.

Sedation Time: 15 minutes

Fluoroscopy time: 3 seconds

PROCEDURE/FINDINGS:

Informed consent was obtained from the patient following
explanation of the procedure, risks, benefits and alternatives.
The patient understands, agrees and consents for the procedure.
All questions were addressed.  A time out was performed.

The patient was positioned prone and noncontrast localization CT
was performed of the pelvis to demonstrate the iliac marrow spaces.

Maximal barrier sterile technique utilized including caps, mask,
sterile gowns, sterile gloves, large sterile drape, hand hygiene,
and betadine prep.

Under sterile conditions and local anesthesia, an 11 gauge coaxial
bone biopsy needle was advanced into the right iliac marrow space.
Needle position was confirmed with CT imaging. Initially, bone
marrow aspiration was performed. Next, the 11 gauge outer cannula
was utilized to obtain a right iliac bone marrow core biopsy.
Needle was removed. Hemostasis was obtained with compression. The
patient tolerated the procedure well. Samples were prepared with
the cytotechnologist. No immediate complications.
IMPRESSION: CT guided right iliac bone marrow aspiration and core biopsy.

[REDACTED]

## 2013-04-26 ENCOUNTER — Inpatient Hospital Stay (HOSPITAL_COMMUNITY)
Admission: EM | Admit: 2013-04-26 | Discharge: 2013-04-28 | DRG: 202 | Disposition: A | Discharge status: Home / Self Care | Payer: Medicare Other | Attending: Internal Medicine | Admitting: Internal Medicine

## 2013-04-26 ENCOUNTER — Inpatient Hospital Stay (HOSPITAL_COMMUNITY): Payer: Medicare Other

## 2013-04-26 ENCOUNTER — Encounter (HOSPITAL_COMMUNITY): Payer: Self-pay | Admitting: *Deleted

## 2013-04-26 ENCOUNTER — Emergency Department (HOSPITAL_COMMUNITY): Payer: Medicare Other

## 2013-04-26 DIAGNOSIS — E1139 Type 2 diabetes mellitus with other diabetic ophthalmic complication: Secondary | ICD-10-CM | POA: Diagnosis present

## 2013-04-26 DIAGNOSIS — E11319 Type 2 diabetes mellitus with unspecified diabetic retinopathy without macular edema: Secondary | ICD-10-CM | POA: Diagnosis present

## 2013-04-26 DIAGNOSIS — Z8611 Personal history of tuberculosis: Secondary | ICD-10-CM

## 2013-04-26 DIAGNOSIS — J9600 Acute respiratory failure, unspecified whether with hypoxia or hypercapnia: Secondary | ICD-10-CM | POA: Diagnosis not present

## 2013-04-26 DIAGNOSIS — Z992 Dependence on renal dialysis: Secondary | ICD-10-CM

## 2013-04-26 DIAGNOSIS — I10 Essential (primary) hypertension: Secondary | ICD-10-CM

## 2013-04-26 DIAGNOSIS — J811 Chronic pulmonary edema: Secondary | ICD-10-CM | POA: Diagnosis present

## 2013-04-26 DIAGNOSIS — T8610 Unspecified complication of kidney transplant: Secondary | ICD-10-CM | POA: Diagnosis present

## 2013-04-26 DIAGNOSIS — E119 Type 2 diabetes mellitus without complications: Secondary | ICD-10-CM | POA: Diagnosis present

## 2013-04-26 DIAGNOSIS — Z940 Kidney transplant status: Secondary | ICD-10-CM

## 2013-04-26 DIAGNOSIS — I501 Left ventricular failure: Secondary | ICD-10-CM | POA: Diagnosis present

## 2013-04-26 DIAGNOSIS — N189 Chronic kidney disease, unspecified: Secondary | ICD-10-CM | POA: Diagnosis present

## 2013-04-26 DIAGNOSIS — E871 Hypo-osmolality and hyponatremia: Secondary | ICD-10-CM | POA: Diagnosis present

## 2013-04-26 DIAGNOSIS — E875 Hyperkalemia: Secondary | ICD-10-CM | POA: Diagnosis present

## 2013-04-26 DIAGNOSIS — Z9981 Dependence on supplemental oxygen: Secondary | ICD-10-CM

## 2013-04-26 DIAGNOSIS — E785 Hyperlipidemia, unspecified: Secondary | ICD-10-CM | POA: Diagnosis present

## 2013-04-26 DIAGNOSIS — D638 Anemia in other chronic diseases classified elsewhere: Secondary | ICD-10-CM | POA: Diagnosis present

## 2013-04-26 DIAGNOSIS — J209 Acute bronchitis, unspecified: Principal | ICD-10-CM | POA: Diagnosis present

## 2013-04-26 DIAGNOSIS — Y830 Surgical operation with transplant of whole organ as the cause of abnormal reaction of the patient, or of later complication, without mention of misadventure at the time of the procedure: Secondary | ICD-10-CM | POA: Diagnosis present

## 2013-04-26 DIAGNOSIS — J189 Pneumonia, unspecified organism: Secondary | ICD-10-CM

## 2013-04-26 DIAGNOSIS — Z794 Long term (current) use of insulin: Secondary | ICD-10-CM

## 2013-04-26 DIAGNOSIS — D649 Anemia, unspecified: Secondary | ICD-10-CM

## 2013-04-26 DIAGNOSIS — C569 Malignant neoplasm of unspecified ovary: Secondary | ICD-10-CM

## 2013-04-26 DIAGNOSIS — R0602 Shortness of breath: Secondary | ICD-10-CM | POA: Diagnosis present

## 2013-04-26 DIAGNOSIS — N186 End stage renal disease: Secondary | ICD-10-CM

## 2013-04-26 DIAGNOSIS — I120 Hypertensive chronic kidney disease with stage 5 chronic kidney disease or end stage renal disease: Secondary | ICD-10-CM | POA: Diagnosis present

## 2013-04-26 LAB — POCT I-STAT, CHEM 8
BUN: 45 mg/dL — ABNORMAL HIGH (ref 6–23)
Calcium, Ion: 1.13 mmol/L (ref 1.12–1.23)
Creatinine, Ser: 6.5 mg/dL — ABNORMAL HIGH (ref 0.50–1.10)
Glucose, Bld: 194 mg/dL — ABNORMAL HIGH (ref 70–99)
TCO2: 25 mmol/L (ref 0–100)

## 2013-04-26 LAB — HEMOGLOBIN A1C
Hgb A1c MFr Bld: 6.6 % — ABNORMAL HIGH (ref <5.7)
Mean Plasma Glucose: 143 mg/dL — ABNORMAL HIGH (ref <117)

## 2013-04-26 LAB — COMPREHENSIVE METABOLIC PANEL
ALT: 7 U/L (ref 0–35)
Albumin: 2.5 g/dL — ABNORMAL LOW (ref 3.5–5.2)
Alkaline Phosphatase: 64 U/L (ref 39–117)
Potassium: 5.6 mEq/L — ABNORMAL HIGH (ref 3.5–5.1)
Sodium: 125 mEq/L — ABNORMAL LOW (ref 135–145)
Total Protein: 8.2 g/dL (ref 6.0–8.3)

## 2013-04-26 LAB — CBC WITH DIFFERENTIAL/PLATELET
Basophils Absolute: 0 10*3/uL (ref 0.0–0.1)
Eosinophils Relative: 1 % (ref 0–5)
HCT: 23.4 % — ABNORMAL LOW (ref 36.0–46.0)
Lymphocytes Relative: 6 % — ABNORMAL LOW (ref 12–46)
MCV: 85.1 fL (ref 78.0–100.0)
Monocytes Absolute: 0.7 10*3/uL (ref 0.1–1.0)
RDW: 16.1 % — ABNORMAL HIGH (ref 11.5–15.5)
WBC: 16.6 10*3/uL — ABNORMAL HIGH (ref 4.0–10.5)

## 2013-04-26 LAB — MRSA PCR SCREENING: MRSA by PCR: NEGATIVE

## 2013-04-26 LAB — BLOOD GAS, ARTERIAL
Bicarbonate: 30.6 mEq/L — ABNORMAL HIGH (ref 20.0–24.0)
Patient temperature: 98.6
TCO2: 31.8 mmol/L (ref 0–100)
pCO2 arterial: 38.7 mmHg (ref 35.0–45.0)
pH, Arterial: 7.509 — ABNORMAL HIGH (ref 7.350–7.450)
pO2, Arterial: 52.9 mmHg — ABNORMAL LOW (ref 80.0–100.0)

## 2013-04-26 LAB — GLUCOSE, CAPILLARY: Glucose-Capillary: 184 mg/dL — ABNORMAL HIGH (ref 70–99)

## 2013-04-26 LAB — POCT I-STAT TROPONIN I: Troponin i, poc: 0.05 ng/mL (ref 0.00–0.08)

## 2013-04-26 LAB — IRON AND TIBC: Saturation Ratios: 17 % — ABNORMAL LOW (ref 20–55)

## 2013-04-26 LAB — HIV ANTIBODY (ROUTINE TESTING W REFLEX): HIV: NONREACTIVE

## 2013-04-26 LAB — FERRITIN: Ferritin: 2202 ng/mL — ABNORMAL HIGH (ref 10–291)

## 2013-04-26 MED ORDER — METOCLOPRAMIDE HCL 5 MG PO TABS
5.0000 mg | ORAL_TABLET | Freq: Four times a day (QID) | ORAL | Status: DC
  Administered 2013-04-26 – 2013-04-28 (×6): 5 mg via ORAL
  Filled 2013-04-26 (×10): qty 1

## 2013-04-26 MED ORDER — VANCOMYCIN HCL IN DEXTROSE 750-5 MG/150ML-% IV SOLN
750.0000 mg | INTRAVENOUS | Status: DC
  Filled 2013-04-26 (×2): qty 150

## 2013-04-26 MED ORDER — RENA-VITE PO TABS
1.0000 | ORAL_TABLET | Freq: Every day | ORAL | Status: DC
  Administered 2013-04-27: 1 via ORAL
  Filled 2013-04-26 (×2): qty 1

## 2013-04-26 MED ORDER — ACETAMINOPHEN 325 MG PO TABS
650.0000 mg | ORAL_TABLET | Freq: Four times a day (QID) | ORAL | Status: DC | PRN
  Administered 2013-04-26 – 2013-04-28 (×2): 650 mg via ORAL
  Filled 2013-04-26 (×2): qty 2

## 2013-04-26 MED ORDER — RENA-VITE PO TABS
1.0000 | ORAL_TABLET | Freq: Every day | ORAL | Status: DC
  Filled 2013-04-26: qty 1

## 2013-04-26 MED ORDER — ACETAMINOPHEN 325 MG PO TABS
ORAL_TABLET | ORAL | Status: AC
  Filled 2013-04-26: qty 2

## 2013-04-26 MED ORDER — SODIUM POLYSTYRENE SULFONATE 15 GM/60ML PO SUSP
30.0000 g | Freq: Once | ORAL | Status: AC
  Administered 2013-04-26: 30 g via ORAL
  Filled 2013-04-26: qty 120

## 2013-04-26 MED ORDER — HEPARIN SODIUM (PORCINE) 1000 UNIT/ML IJ SOLN
1460.0000 [IU] | Freq: Once | INTRAMUSCULAR | Status: AC
  Administered 2013-04-26: 1460 [IU] via INTRAVENOUS

## 2013-04-26 MED ORDER — INSULIN GLARGINE 100 UNIT/ML ~~LOC~~ SOLN
35.0000 [IU] | Freq: Every day | SUBCUTANEOUS | Status: DC
  Administered 2013-04-26 – 2013-04-27 (×2): 35 [IU] via SUBCUTANEOUS
  Filled 2013-04-26 (×3): qty 0.35

## 2013-04-26 MED ORDER — DARBEPOETIN ALFA-POLYSORBATE 200 MCG/0.4ML IJ SOLN
200.0000 ug | Freq: Once | INTRAMUSCULAR | Status: AC
  Administered 2013-04-26: 200 ug via INTRAVENOUS
  Filled 2013-04-26: qty 0.4

## 2013-04-26 MED ORDER — SIMVASTATIN 20 MG PO TABS
20.0000 mg | ORAL_TABLET | ORAL | Status: DC
  Administered 2013-04-26 – 2013-04-28 (×2): 20 mg via ORAL
  Filled 2013-04-26 (×2): qty 1

## 2013-04-26 MED ORDER — DEXTROSE 5 % IV SOLN
500.0000 mg | Freq: Once | INTRAVENOUS | Status: AC
  Administered 2013-04-26: 500 mg via INTRAVENOUS
  Filled 2013-04-26: qty 500

## 2013-04-26 MED ORDER — ASPIRIN EC 81 MG PO TBEC
81.0000 mg | DELAYED_RELEASE_TABLET | Freq: Every day | ORAL | Status: DC
  Administered 2013-04-26 – 2013-04-27 (×2): 81 mg via ORAL
  Filled 2013-04-26 (×3): qty 1

## 2013-04-26 MED ORDER — CALCIUM ACETATE 667 MG PO CAPS
2001.0000 mg | ORAL_CAPSULE | Freq: Three times a day (TID) | ORAL | Status: DC
  Administered 2013-04-26 – 2013-04-28 (×5): 2001 mg via ORAL
  Filled 2013-04-26 (×8): qty 3

## 2013-04-26 MED ORDER — DEXTROSE 5 % IV SOLN
2.0000 g | INTRAVENOUS | Status: AC
  Administered 2013-04-26: 2 g via INTRAVENOUS
  Filled 2013-04-26: qty 2

## 2013-04-26 MED ORDER — HEPARIN SODIUM (PORCINE) 5000 UNIT/ML IJ SOLN
5000.0000 [IU] | Freq: Three times a day (TID) | INTRAMUSCULAR | Status: DC
  Administered 2013-04-26 – 2013-04-28 (×5): 5000 [IU] via SUBCUTANEOUS
  Filled 2013-04-26 (×11): qty 1

## 2013-04-26 MED ORDER — VANCOMYCIN HCL 10 G IV SOLR
1500.0000 mg | Freq: Once | INTRAVENOUS | Status: AC
  Administered 2013-04-26: 1500 mg via INTRAVENOUS
  Filled 2013-04-26: qty 1500

## 2013-04-26 MED ORDER — DARBEPOETIN ALFA-POLYSORBATE 200 MCG/0.4ML IJ SOLN
INTRAMUSCULAR | Status: AC
  Filled 2013-04-26: qty 0.4

## 2013-04-26 MED ORDER — DEXTROSE 5 % IV SOLN
2.0000 g | INTRAVENOUS | Status: DC
  Filled 2013-04-26: qty 2

## 2013-04-26 MED ORDER — PREDNISONE 5 MG PO TABS
5.0000 mg | ORAL_TABLET | Freq: Every day | ORAL | Status: DC
  Administered 2013-04-26 – 2013-04-27 (×2): 5 mg via ORAL
  Filled 2013-04-26 (×3): qty 1

## 2013-04-26 MED ORDER — CEFEPIME HCL 2 G IJ SOLR
2.0000 g | INTRAMUSCULAR | Status: DC
  Filled 2013-04-26: qty 2

## 2013-04-26 MED ORDER — INSULIN ASPART 100 UNIT/ML ~~LOC~~ SOLN
6.0000 [IU] | Freq: Three times a day (TID) | SUBCUTANEOUS | Status: DC
  Administered 2013-04-27 – 2013-04-28 (×2): 6 [IU] via SUBCUTANEOUS

## 2013-04-26 MED ORDER — ACETAMINOPHEN 325 MG PO TABS
650.0000 mg | ORAL_TABLET | Freq: Once | ORAL | Status: AC
  Administered 2013-04-26: 650 mg via ORAL

## 2013-04-26 MED ORDER — CEFEPIME HCL 1 G IJ SOLR
1.0000 g | INTRAMUSCULAR | Status: DC
  Filled 2013-04-26: qty 1

## 2013-04-26 MED ORDER — DEXTROSE 5 % IV SOLN
1.0000 g | Freq: Once | INTRAVENOUS | Status: AC
  Administered 2013-04-26: 1 g via INTRAVENOUS
  Filled 2013-04-26: qty 10

## 2013-04-26 NOTE — Consult Note (Signed)
Requesting Physician:  Dr. Preston Fleeting [facility] ED) Reason for Consult:  Pulmonary edema, hyperkalemia HPI: The patient is a 37 y.o. year-old Asian woman with DM, HTN, Anemia,ESRD, failed kidney and pancreas transplants, on TTS HD at [facility].  Last HD was 7/19 received full treatment and left 0.2 kg below her EDW of 73 kg.  Reports onset of progressive dyspnea over past 8 hours, with associated cough, fever, sputum production with sime blood tinge. Presented to the ED where initial CXR felt compatible with pneumonia, then pt developed worsening respiratory distress and required NRB mask.  Repeat CXR with more diffuse edema.  We were called to provide urgent dialysis adn the [facility] will admit.  Pt reports ate a large amount of "steamed cabbage" over the weekend and thinks fluids up more than usual.  Generally gains 4-4.5 kg between treatments. States fevers at home not sure how high (100 on arrival to ED) Has had issues with symptomatic anemia requiring transfusion. Last outpt Hb was 7.6 on 04/22/13.  Past Medical History:  Past Medical History  Diagnosis Date  . History of renal transplant 2010  . Diabetic retinopathy   . HTN (hypertension)     not well controlled  . 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 HLD (hyperlipidemia)   . ESRD on dialysis 10/20/2008    Hx of pancreas-kidney transplant in 2010, started back on dialysis in Oct 2013. Back on insulin also due to failure of pancreatic graft.    [name] ESRD on dialysis     "[facility]; TTS; (01/27/2013)  . History of blood transfusion     "lots of times" (01/27/2013)  . CAP (community acquired pneumonia) 2010  . Shortness of breath     "w/dialysis" (01/27/2013)  . Type II diabetes mellitus   . Anemia   . Ovarian tumor, malignant 2005    resected in [place] pt denies that this was cancer on 01/27/2013    Past Surgical  History:  Past Surgical History  Procedure Laterality Date  . Tumor removal  2005    Ovarian, resected in [place]  . Av fistula placement  06/03/2012    Procedure: INSERTION OF ARTERIOVENOUS (AV) GORE-TEX GRAFT ARM;  Surgeon: Larina Earthly, MD;  Location: [facility] OR;  Service: Vascular;  Laterality: Left;  [name] Eye surgery Right     "cause of diabetes" (4/23/20140  . Combined kidney-pancreas transplant  2010    /notes 01/27/2013; @ [facility]  . 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 Renal biopsy  12/13/2010; 10/25/2011    [name] 01/27/2013    Family History:  Family History  Problem Relation Age of Onset  . Diabetes Mother   . Liver cancer Father   . Cancer Father    Social History:  reports that she has never smoked. She has never used smokeless tobacco. She reports that she does not drink alcohol or use illicit drugs.  Allergies:  Allergies  Allergen Reactions  . Ace Inhibitors Other (See Comments) and Cough     cause cough Per medical records  cause cough  ROS: Cough, sputum production with some blood streaking Chills, fever Progressive shortness of breath Some chest discomfort with deep breaths No N/V/D  Physical Exam:  BP 169/77  Pulse 125  Temp(Src) 98.8 F (37.1 C) (Oral)  Resp 30  Ht 5\' 3"  (1.6 m)  Wt 76 kg (167 lb 8.8 oz)  BMI 29.69 kg/m2  SpO2 91% Gen: Dyspneic asian woman, leaning over bedside table to breathe Skin: no rash, cyanosis Neck: JVD approx 5 cm Chest:crackles diffusely posteriorly Heart: Tachy S1S2 2/6 M USB Abdomen: soft, protuberant. Healed scars from prior KP transplants Non tender to palpation Ext: 2+ edema bilaterally Neuro: alert, Ox3, no focal deficit Heme/Lymph: no bruising or LAN Left upper arm AVG good bruit and thrill  Recent Labs  04/26/13 0102 04/26/13 0122  NA 125* 128*  K 5.6* 5.6*  CL 85* 92*  CO2 26  --   GLUCOSE 187* 194*  BUN 46* 45*  CREATININE 6.68* 6.50*   CALCIUM 9.6  --    Liver Function Tests:  Recent Labs  04/26/13 0102  AST 10  ALT 7  ALKPHOS 64  BILITOT 0.4  PROT 8.2  ALBUMIN 2.5*   CBC:  Recent Labs  04/26/13 0102 04/26/13 0122  WBC 16.6*  --   NEUTROABS 14.6*  --   HGB 7.5* 8.5*  HCT 23.4* 25.0*  MCV 85.1  --   PLT 351  --     Xrays/Other Studies: Dg Chest Portable 1 View  04/26/2013   *RADIOLOGY REPORT*  Clinical Data: Shortness of breath.  Cough.  Tachycardia.  Nausea and vomiting.  PORTABLE CHEST - 1 VIEW  Comparison: 04/26/2013 at 0101 hours.  Findings: Cardiac enlargement with mild pulmonary vascular congestion.  Bilateral basilar air space disease could represent pneumonia or edema.  No blunting of costophrenic angles.  No pneumothorax.  Stable appearance since previous study.  IMPRESSION: Cardiac enlargement with pulmonary vascular congestion.  Airspace disease in the mid and lower lungs bilaterally may represent pneumonia or edema.  No significant change since earlier today.   Original Report Authenticated By: Burman Nieves, M.D.   Dg Chest Portable 1 View  04/26/2013   *RADIOLOGY REPORT*  Clinical Data: Shortness of breath and tachycardia.  PORTABLE CHEST - 1 VIEW  Comparison: 02/07/2013.  Findings: The heart is enlarged.  Right middle lobe and left lower lobe airspace disease is present.  Mild pulmonary vascular congestion is noted.  The visualized soft tissues and bony thorax are unremarkable.  IMPRESSION:  1.  Progressive right middle lobe and left lower lobe airspace disease. 2.  Cardiomegaly and mild pulmonary vascular congestion.   Original Report Authenticated By: Marin Roberts, M.D.   Outpt dialysis: TTS [facility] 3 1/2 hours F180 2K2.25Ca 73 kg EDW (leaves under)  EPO 20,000 TIW  Doxercalciferol 4 mcg   Last HD 7/19 left at 72.8 kg and consistently leaves sl under EDW  Impression/Plan  37 yo Asian woman with DM, HTN, failed K-P transplants, presents with respiratory distress, fever, pulm  infiltrates +/-edema, admitted for urgent dialysis (off schedule) and antibiotics  1. ESRD Urgent HD this AM off schedule - 4 hours rather than 3 1/2 -  remove 4 liters Will likely require treatment again tomorrow Needs lower EDW 2. Pulm infiltrates/fever/leukocytosis - PNA +/- edema. Post HD CXR to help sort out. ATB's per primary service. 3. Anemia - on large doses EPO.  Has required intermittent transfusion in recent past.  By report getting workup at [facility]? 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  Get outpt recs - not sure what  plan is for eventual D/C of this as both allografts failed   Camille Bal,  MD [facility] [phone] pager 04/26/2013, 5:19 AM

## 2013-04-26 NOTE — Evaluation (Signed)
Received call about pt with worsening O2 requirement and SOB in setting of baseline ESRD on HD T, TH, S. CXR with + PNA. Started on CAP (though admitted w/in last 3 months and will need HCAP tx). Now with worsening dyspnea concerning for flash pulmonary edema. Obtaining repeat CXR. Pt will likely need emergent dialysis. Instructed to call us once resp status stabilizes/completes dialysis.

## 2013-04-26 NOTE — Procedures (Signed)
I was present at this session.  I have reviewed the session itself and made appropriate changes.  BP ^, lower vol more.  Access press ok.  Jannine Abreu L 7/21/20148:22 AM

## 2013-04-26 NOTE — H&P (Signed)
[facility] History and Physical  [name]:[id number] DOB: 1976/04/19 DOA: 04/26/2013  Referring physician: Dr. ED PCP: Juline Patch, MD  Specialists: nephrology  Chief Complaint: SOB  HPI: [name] is a 37 y.o. female has a past medical history significant for ESRD now s/p failed transplant on chronic immunosuppression with Prograf and Prednisone, DM, HTN, comes in with a CC of shortness of breath, fever, chills and productive cough. She is getting HD TTS and last HD was on Sat. Dry weight 73 kg. Does report some dietary indiscretion over the weekend and fluids more than usual. Patient was evaluated by the EDP and then directly transferred to the HD unit for emergent HD.   Review of Systems: as per HPI otherwise negative.   Past Medical History  Diagnosis Date  . History of renal transplant 2010  . Diabetic retinopathy   . HTN (hypertension)     not well controlled  . 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 HLD (hyperlipidemia)   . ESRD on dialysis 10/20/2008    Hx of pancreas-kidney transplant in 2010, started back on dialysis in Oct 2013. Back on insulin also due to failure of pancreatic graft.    [name] ESRD on dialysis     "[facility]; TTS; (01/27/2013)  . History of blood transfusion     "lots of times" (01/27/2013)  . CAP (community acquired pneumonia) 2010  . Shortness of breath     "w/dialysis" (01/27/2013)  . Type II diabetes mellitus   . Anemia   . Ovarian tumor, malignant 2005    resected in [place] pt denies that this was cancer on 01/27/2013   Past Surgical History  Procedure Laterality Date  . Tumor removal  2005    Ovarian, resected in [place]  . Av fistula placement  06/03/2012    Procedure: INSERTION OF ARTERIOVENOUS (AV) GORE-TEX GRAFT ARM;  Surgeon: Larina Earthly, MD;  Location: [facility] OR;  Service: Vascular;  Laterality: Left;  [name] Eye surgery Right     "cause of diabetes"  (4/23/20140  . Combined kidney-pancreas transplant  2010    /notes 01/27/2013; @ [facility]  . 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 Renal biopsy  12/13/2010; 10/25/2011    [name] 01/27/2013   Social History:  reports that she has never smoked. She has never used smokeless tobacco. She reports that she does not drink alcohol or use illicit drugs.  Allergies  Allergen Reactions  . Ace Inhibitors Other (See Comments) and Cough     cause cough Per medical records  cause cough    Family History  Problem Relation Age of Onset  . Diabetes Mother   . Liver cancer Father   . Cancer Father     Prior to Admission medications   Medication Sig Start Date End Date Taking? Authorizing Provider  amLODipine (NORVASC) 10 MG tablet Take 10 mg by mouth at bedtime.    Yes Historical Provider, MD  aspirin EC 81 MG tablet Take 81 mg by mouth at bedtime.    Yes Historical Provider, MD  calcium acetate (PHOSLO) 667 MG capsule Take 3 capsules (2,001 mg total) by mouth 3 (three) times daily with meals. 01/28/13  Yes Sheffield Slider, PA-C  insulin aspart (NOVOLOG) 100 UNIT/ML injection Inject 12 Units into the skin 3 (three) times daily with meals.  Yes Historical Provider, MD  insulin glargine (LANTUS) 100 UNIT/ML injection Inject 63 Units into the skin at bedtime.    Yes Historical Provider, MD  labetalol (NORMODYNE) 100 MG tablet Take 200 mg by mouth 2 (two) times daily.    Yes Historical Provider, MD  metoCLOPramide (REGLAN) 5 MG tablet Take 5 mg by mouth 4 (four) times daily.   Yes Historical Provider, MD  predniSONE (DELTASONE) 5 MG tablet Take 5 mg by mouth at bedtime.    Yes Historical Provider, MD  simvastatin (ZOCOR) 20 MG tablet Take 20 mg by mouth every Monday, Wednesday, and Friday.   Yes Historical Provider, MD  tacrolimus (PROGRAF) 1 MG capsule Take 1 mg by mouth at bedtime.    Yes Historical Provider, MD   Physical  Exam: Filed Vitals:   04/26/13 0610 04/26/13 0636 04/26/13 0700 04/26/13 0729  BP: 163/79 174/82 183/82 179/83  Pulse: 119 120 123 124  Temp:      TempSrc:      Resp: 32 32  35  Height:      Weight:      SpO2:         General:  Tachypneic this morning  Eyes: PERRL, EOMI, no scleral icterus  ENT: moist oropharynx  Neck: supple, no JVD  Cardiovascular: regular rate without MRG; 2+ peripheral pulses; tachycardia noted  Respiratory: coarse breath sounds  Abdomen: soft, non tender to palpation, positive bowel sounds, no guarding, no rebound  Skin: no rashes  Musculoskeletal: no peripheral edema  Neurologic: non focal  Labs on Admission:  Basic Metabolic Panel:  Recent Labs Lab 04/26/13 0102 04/26/13 0122  NA 125* 128*  K 5.6* 5.6*  CL 85* 92*  CO2 26  --   GLUCOSE 187* 194*  BUN 46* 45*  CREATININE 6.68* 6.50*  CALCIUM 9.6  --    Liver Function Tests:  Recent Labs Lab 04/26/13 0102  AST 10  ALT 7  ALKPHOS 64  BILITOT 0.4  PROT 8.2  ALBUMIN 2.5*   CBC:  Recent Labs Lab 04/26/13 0102 04/26/13 0122  WBC 16.6*  --   NEUTROABS 14.6*  --   HGB 7.5* 8.5*  HCT 23.4* 25.0*  MCV 85.1  --   PLT 351  --    BNP (last 3 results)  Recent Labs  02/01/13 2344  PROBNP 58488.0*   Radiological Exams on Admission: Dg Chest Portable 1 View  04/26/2013   *RADIOLOGY REPORT*  Clinical Data: Shortness of breath.  Cough.  Tachycardia.  Nausea and vomiting.  PORTABLE CHEST - 1 VIEW  Comparison: 04/26/2013 at 0101 hours.  Findings: Cardiac enlargement with mild pulmonary vascular congestion.  Bilateral basilar air space disease could represent pneumonia or edema.  No blunting of costophrenic angles.  No pneumothorax.  Stable appearance since previous study.  IMPRESSION: Cardiac enlargement with pulmonary vascular congestion.  Airspace disease in the mid and lower lungs bilaterally may represent pneumonia or edema.  No significant change since earlier today.    Original Report Authenticated By: Burman Nieves, M.D.   Dg Chest Portable 1 View  04/26/2013   *RADIOLOGY REPORT*  Clinical Data: Shortness of breath and tachycardia.  PORTABLE CHEST - 1 VIEW  Comparison: 02/07/2013.  Findings: The heart is enlarged.  Right middle lobe and left lower lobe airspace disease is present.  Mild pulmonary vascular congestion is noted.  The visualized soft tissues and bony thorax are unremarkable.  IMPRESSION:  1.  Progressive right middle lobe and left lower lobe airspace  disease. 2.  Cardiomegaly and mild pulmonary vascular congestion.   Original Report Authenticated By: Marin Roberts, M.D.    EKG: Independently reviewed.  Assessment/Plan Active Problems:   HYPERLIPIDEMIA   HYPERTENSION   ESRD on dialysis   Shortness of breath   History of renal transplant   Anemia   DM (diabetes mellitus)   Pulmonary edema   SOB due to HCAP and pulmonary edema - repeat CXR after HD shows improvement in edema; O2 support as needed. She is on chronic O2 at home - Vanc/Cefepime per pharmacy.  ESRD - per renal, undergoing HD DM - NPO now due to respiratory status, re-assess after HD and decide Pulmonary edema - undergoing HD HTN - holding meds now given potential sepsis with source, tachycardia, tachypnea, WBC, undergoing HD, restart as indicated.  Hyperkalemia - management with HD Anemia - of chronic disease Hyponatremia DVT Prophylaxis - heparin  Code Status: presumed Full  Family Communication: none  Disposition Plan: inpatient  Time spent: 81  Costin M. Elvera Lennox, MD [facility] Pager [phone]  If 7PM-7AM, please contact night-coverage [email or web address] Password [facility] 04/26/2013, 7:38 AM

## 2013-04-26 NOTE — ED Notes (Signed)
Patient amb to the bathroom  And became very SOB.  Placed on chair and patient moved back to her room.  NRB applied due to sats 70's

## 2013-04-26 NOTE — Progress Notes (Signed)
ANTIBIOTIC CONSULT NOTE - INITIAL  Pharmacy Consult for vancomycin and cefepime Indication: rule out pneumonia  Allergies  Allergen Reactions  . Ace Inhibitors Other (See Comments) and Cough     cause cough Per medical records  cause cough    Patient Measurements: Height: 5\' 3"  (160 cm) Weight: 166 lb 10.7 oz (75.6 kg) IBW/kg (Calculated) : 52.4  Vital Signs: Temp: 98.4 F (36.9 C) (07/21 0550) Temp src: Oral (07/21 0550) BP: 179/83 mmHg (07/21 0729) Pulse Rate: 124 (07/21 0729)  Labs:  Recent Labs  04/26/13 0102 04/26/13 0122  WBC 16.6*  --   HGB 7.5* 8.5*  PLT 351  --   CREATININE 6.68* 6.50*   Estimated Creatinine Clearance: 11.7 ml/min (by C-G formula based on Cr of 6.5).   Microbiology: No results found for this or any previous visit (from the past 720 hour(s)).  Medical History: Past Medical History  Diagnosis Date  . History of renal transplant 2010  . Diabetic retinopathy   . HTN (hypertension)     not well controlled  . 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 HLD (hyperlipidemia)   . ESRD on dialysis 10/20/2008    Hx of pancreas-kidney transplant in 2010, started back on dialysis in Oct 2013. Back on insulin also due to failure of pancreatic graft.    [name] ESRD on dialysis     "[facility]; TTS; (01/27/2013)  . History of blood transfusion     "lots of times" (01/27/2013)  . CAP (community acquired pneumonia) 2010  . Shortness of breath     "w/dialysis" (01/27/2013)  . Type II diabetes mellitus   . Anemia   . Ovarian tumor, malignant 2005    resected in [place] pt denies that this was cancer on 01/27/2013    Medications:  Prescriptions prior to admission  Medication Sig Dispense Refill  . amLODipine (NORVASC) 10 MG tablet Take 10 mg by mouth at bedtime.       [name] aspirin EC 81 MG tablet Take 81 mg by mouth at bedtime.       . calcium acetate (PHOSLO) 667 MG  capsule Take 3 capsules (2,001 mg total) by mouth 3 (three) times daily with meals.      . insulin aspart (NOVOLOG) 100 UNIT/ML injection Inject 12 Units into the skin 3 (three) times daily with meals.      . insulin glargine (LANTUS) 100 UNIT/ML injection Inject 63 Units into the skin at bedtime.       [name] labetalol (NORMODYNE) 100 MG tablet Take 200 mg by mouth 2 (two) times daily.       . metoCLOPramide (REGLAN) 5 MG tablet Take 5 mg by mouth 4 (four) times daily.      . predniSONE (DELTASONE) 5 MG tablet Take 5 mg by mouth at bedtime.       . simvastatin (ZOCOR) 20 MG tablet Take 20 mg by mouth every Monday, Wednesday, and Friday.      . tacrolimus (PROGRAF) 1 MG capsule Take 1 mg by mouth at bedtime.        Scheduled:  . acetaminophen  650 mg Oral Once  . ceFEPime (MAXIPIME) IV  2 g Intravenous Q T,Th,S,Su-1800  . darbepoetin (ARANESP) injection - DIALYSIS  200 mcg Intravenous Once  . heparin  5,000 Units Subcutaneous Q8H  . multivitamin  1 tablet Oral Daily  . vancomycin  1,500  mg Intravenous Once  . [START ON 04/27/2013] vancomycin  750 mg Intravenous Q T,Th,Sa-HD    Assessment: 37yo female c/o progressive dyspnea associated w/ cough, fever, and sputum production, CXR concerning for PNA, to begin IV ABX.  Goal of Therapy:  Pre-HD vanc level 15-25  Plan:  Will give vancomycin 1500mg  IV x1 then 750mg  IV after each HD as well as cefepime 2g IV TTS and monitor CBC, Cx, levels prn.  Vernard Gambles, PharmD, BCPS  04/26/2013,7:53 AM

## 2013-04-26 NOTE — ED Notes (Addendum)
Pt on 5L O2  at home, usually 88-89%, now 84%, here for sob, cough, HR noted to be 122. H/o ESRD & anemia. HD on T, TH, S, access site L upper arm AV fistula.

## 2013-04-26 NOTE — ED Provider Notes (Signed)
History    CSN: [id number] Arrival date & time 04/26/13  0037  First MD Initiated Contact with Patient 04/26/13 0106     Chief Complaint  Patient presents with  . Shortness of Breath  . Cough  . Tachycardia   (Consider location/radiation/quality/duration/timing/severity/associated sxs/prior Treatment) Patient is a 37 y.o. female presenting with shortness of breath and cough. The history is provided by the patient.  Shortness of Breath Associated symptoms: cough   Cough Associated symptoms: shortness of breath   She is a dialysis patient with dialysis on Tuesday-Thursday-Saturday. She had normal dialysis yesterday. At 7 PM, she started having difficulty breathing which got worse at about 9 PM. There's been a cough productive of small amount of clear sputum. She has not had chest pain, and this, tightness, pressure. She denies fever, chills, sweats. She is normally on oxygen at home at 3 L per minute. Oxygen saturations dropped to 88% and her oxygen level was increased to 5 L. Nothing makes her symptoms better nothing makes them worse. Past Medical History  Diagnosis Date  . History of renal transplant 2010  . Diabetic retinopathy   . HTN (hypertension)     not well controlled  . 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 HLD (hyperlipidemia)   . ESRD on dialysis 10/20/2008    Hx of pancreas-kidney transplant in 2010, started back on dialysis in Oct 2013. Back on insulin also due to failure of pancreatic graft.    [name] ESRD on dialysis     "[facility]; TTS; (01/27/2013)  . History of blood transfusion     "lots of times" (01/27/2013)  . CAP (community acquired pneumonia) 2010  . Shortness of breath     "w/dialysis" (01/27/2013)  . Type II diabetes mellitus   . Anemia   . Ovarian tumor, malignant 2005    resected in [place] pt denies that this was cancer on 01/27/2013   Past Surgical History   Procedure Laterality Date  . Tumor removal  2005    Ovarian, resected in [place]  . Av fistula placement  06/03/2012    Procedure: INSERTION OF ARTERIOVENOUS (AV) GORE-TEX GRAFT ARM;  Surgeon: Larina Earthly, MD;  Location: [facility] OR;  Service: Vascular;  Laterality: Left;  [name] Eye surgery Right     "cause of diabetes" (4/23/20140  . Combined kidney-pancreas transplant  2010    /notes 01/27/2013; @ [facility]  . 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 Renal biopsy  12/13/2010; 10/25/2011    [name] 01/27/2013   Family History  Problem Relation Age of Onset  . Diabetes Mother   . Liver cancer Father   . Cancer Father    History  Substance Use Topics  . Smoking status: Never Smoker   . Smokeless tobacco: Never Used  . Alcohol Use: No   OB History   Grav Para Term Preterm Abortions TAB SAB Ect Mult Living                 Review of Systems  Respiratory: Positive for cough and shortness of breath.   All other systems reviewed and are negative.    Allergies  Ace inhibitors  Home Medications   Current Outpatient Rx  Name  Route  Sig  Dispense  Refill  . amLODipine (NORVASC) 10 MG tablet   Oral  Take 10 mg by mouth at bedtime.          [name] aspirin EC 81 MG tablet   Oral   Take 81 mg by mouth at bedtime.          . calcium acetate (PHOSLO) 667 MG capsule   Oral   Take 3 capsules (2,001 mg total) by mouth 3 (three) times daily with meals.         . insulin aspart (NOVOLOG) 100 UNIT/ML injection   Subcutaneous   Inject 12 Units into the skin 3 (three) times daily with meals.         . insulin glargine (LANTUS) 100 UNIT/ML injection   Subcutaneous   Inject 63 Units into the skin at bedtime.          [name] labetalol (NORMODYNE) 100 MG tablet   Oral   Take 200 mg by mouth 2 (two) times daily.          . metoCLOPramide (REGLAN) 5 MG tablet   Oral   Take 5 mg by mouth 4 (four) times daily.         .  predniSONE (DELTASONE) 5 MG tablet   Oral   Take 5 mg by mouth at bedtime.          . simvastatin (ZOCOR) 20 MG tablet   Oral   Take 20 mg by mouth every Monday, Wednesday, and Friday.         . tacrolimus (PROGRAF) 1 MG capsule   Oral   Take 1 mg by mouth at bedtime.           BP 165/92  Pulse 124  Temp(Src) 99.4 F (37.4 C) (Oral)  Resp 30  Ht 5\' 3"  (1.6 m)  Wt 167 lb 8.8 oz (76 kg)  BMI 29.69 kg/m2  SpO2 94% Physical Exam  Nursing note and vitals reviewed.  37 year old female, resting comfortably and in no acute distress. Vital signs are significant for tachypnea with history rate of 30, tachycardia with heart rate of 123. She does not have any fever technically but temperature is 100.0.. Oxygen saturation is 88%, which is hypoxic. Head is normocephalic and atraumatic. PERRLA, EOMI. Oropharynx is clear. Neck is nontender and supple without adenopathy or JVD. Back is nontender and there is no CVA tenderness. Lungs have coarse breath sounds but no overt rales, wheezes, or rhonchi. Chest is nontender. Heart has regular rate and rhythm with 1-2/6 systolic ejection murmur. Abdomen is soft, flat, nontender without masses or hepatosplenomegaly and peristalsis is normoactive. Extremities have trace edema, full range of motion is present. There is also trace presacral edema. AV shunt is present in the left upper arm with strong thrill. Skin is warm and dry without rash. Neurologic: Mental status is normal, cranial nerves are intact, there are no motor or sensory deficits.  ED Course  Procedures (including critical care time) Results for orders placed during the hospital encounter of 04/26/13  MRSA PCR SCREENING      Result Value Range   MRSA by PCR NEGATIVE  NEGATIVE  COMPREHENSIVE METABOLIC PANEL      Result Value Range   Sodium 125 (*) 135 - 145 mEq/L   Potassium 5.6 (*) 3.5 - 5.1 mEq/L   Chloride 85 (*) 96 - 112 mEq/L   CO2 26  19 - 32 mEq/L   Glucose, Bld 187 (*)  70 - 99 mg/dL   BUN 46 (*) 6 - 23 mg/dL   Creatinine, Ser  6.68 (*) 0.50 - 1.10 mg/dL   Calcium 9.6  8.4 - 16.1 mg/dL   Total Protein 8.2  6.0 - 8.3 g/dL   Albumin 2.5 (*) 3.5 - 5.2 g/dL   AST 10  0 - 37 U/L   ALT 7  0 - 35 U/L   Alkaline Phosphatase 64  39 - 117 U/L   Total Bilirubin 0.4  0.3 - 1.2 mg/dL   GFR calc non Af Amer 7 (*) >90 mL/min   GFR calc Af Amer 8 (*) >90 mL/min  CBC WITH DIFFERENTIAL      Result Value Range   WBC 16.6 (*) 4.0 - 10.5 K/uL   RBC 2.75 (*) 3.87 - 5.11 MIL/uL   Hemoglobin 7.5 (*) 12.0 - 15.0 g/dL   HCT 09.6 (*) 04.5 - 40.9 %   MCV 85.1  78.0 - 100.0 fL   MCH 27.3  26.0 - 34.0 pg   MCHC 32.1  30.0 - 36.0 g/dL   RDW 81.1 (*) 91.4 - 78.2 %   Platelets 351  150 - 400 K/uL   Neutrophils Relative % 88 (*) 43 - 77 %   Neutro Abs 14.6 (*) 1.7 - 7.7 K/uL   Lymphocytes Relative 6 (*) 12 - 46 %   Lymphs Abs 1.1  0.7 - 4.0 K/uL   Monocytes Relative 4  3 - 12 %   Monocytes Absolute 0.7  0.1 - 1.0 K/uL   Eosinophils Relative 1  0 - 5 %   Eosinophils Absolute 0.2  0.0 - 0.7 K/uL   Basophils Relative 0  0 - 1 %   Basophils Absolute 0.0  0.0 - 0.1 K/uL  FERRITIN      Result Value Range   Ferritin 2202 (*) 10 - 291 ng/mL  IRON AND TIBC      Result Value Range   Iron 31 (*) 42 - 135 ug/dL   TIBC 956 (*) 213 - 086 ug/dL   Saturation Ratios 17 (*) 20 - 55 %   UIBC 151  125 - 400 ug/dL  HIV ANTIBODY (ROUTINE TESTING)      Result Value Range   HIV NON REACTIVE  NON REACTIVE  HEMOGLOBIN A1C      Result Value Range   Hemoglobin A1C 6.6 (*) <5.7 %   Mean Plasma Glucose 143 (*) <117 mg/dL  BLOOD GAS, ARTERIAL      Result Value Range   O2 Content 3.0     Delivery systems NASAL CANNULA     pH, Arterial 7.509 (*) 7.350 - 7.450   pCO2 arterial 38.7  35.0 - 45.0 mmHg   pO2, Arterial 52.9 (*) 80.0 - 100.0 mmHg   Bicarbonate 30.6 (*) 20.0 - 24.0 mEq/L   TCO2 31.8  0 - 100 mmol/L   Acid-Base Excess 7.2 (*) 0.0 - 2.0 mmol/L   O2 Saturation 89.7     Patient  temperature 98.6     Collection site BRACHIAL ARTERY     Drawn by [id number]     Sample type ARTERIAL DRAW     Allens test (pass/fail) PASS  PASS  RENAL FUNCTION PANEL      Result Value Range   Sodium 131 (*) 135 - 145 mEq/L   Potassium 4.3  3.5 - 5.1 mEq/L   Chloride 91 (*) 96 - 112 mEq/L   CO2 30  19 - 32 mEq/L   Glucose, Bld 155 (*) 70 - 99 mg/dL   BUN 26 (*) 6 - 23  mg/dL   Creatinine, Ser 4.69 (*) 0.50 - 1.10 mg/dL   Calcium 9.7  8.4 - 62.9 mg/dL   Phosphorus 6.0 (*) 2.3 - 4.6 mg/dL   Albumin 2.2 (*) 3.5 - 5.2 g/dL   GFR calc non Af Amer 10 (*) >90 mL/min   GFR calc Af Amer 12 (*) >90 mL/min  GLUCOSE, CAPILLARY      Result Value Range   Glucose-Capillary 184 (*) 70 - 99 mg/dL  CBC      Result Value Range   WBC 13.1 (*) 4.0 - 10.5 K/uL   RBC 2.51 (*) 3.87 - 5.11 MIL/uL   Hemoglobin 6.9 (*) 12.0 - 15.0 g/dL   HCT 52.8 (*) 41.3 - 24.4 %   MCV 86.1  78.0 - 100.0 fL   MCH 27.5  26.0 - 34.0 pg   MCHC 31.9  30.0 - 36.0 g/dL   RDW 01.0 (*) 27.2 - 53.6 %   Platelets 305  150 - 400 K/uL  MAGNESIUM      Result Value Range   Magnesium 2.2  1.5 - 2.5 mg/dL  POCT I-STAT, CHEM 8      Result Value Range   Sodium 128 (*) 135 - 145 mEq/L   Potassium 5.6 (*) 3.5 - 5.1 mEq/L   Chloride 92 (*) 96 - 112 mEq/L   BUN 45 (*) 6 - 23 mg/dL   Creatinine, Ser 6.44 (*) 0.50 - 1.10 mg/dL   Glucose, Bld 034 (*) 70 - 99 mg/dL   Calcium, Ion 7.42  5.95 - 1.23 mmol/L   TCO2 25  0 - 100 mmol/L   Hemoglobin 8.5 (*) 12.0 - 15.0 g/dL   HCT 63.8 (*) 75.6 - 43.3 %  POCT I-STAT TROPONIN I      Result Value Range   Troponin i, poc 0.05  0.00 - 0.08 ng/mL   Comment 3            Dg Chest Port 1 View  04/26/2013   *RADIOLOGY REPORT*  Clinical Data: Pulmonary infiltrates.  PORTABLE CHEST - 1 VIEW  Comparison: Earlier on 04/26/2013 and on 02/07/2013  Findings: There has been significant clearing of the bilateral pulmonary infiltrates consistent with resolving pulmonary edema. Heart size and vascularity are  normal.  No effusions.  IMPRESSION: Marked improvement in the bilateral pulmonary edema.   Original Report Authenticated By: Francene Boyers, M.D.   Dg Chest Portable 1 View  04/26/2013   *RADIOLOGY REPORT*  Clinical Data: Shortness of breath.  Cough.  Tachycardia.  Nausea and vomiting.  PORTABLE CHEST - 1 VIEW  Comparison: 04/26/2013 at 0101 hours.  Findings: Cardiac enlargement with mild pulmonary vascular congestion.  Bilateral basilar air space disease could represent pneumonia or edema.  No blunting of costophrenic angles.  No pneumothorax.  Stable appearance since previous study.  IMPRESSION: Cardiac enlargement with pulmonary vascular congestion.  Airspace disease in the mid and lower lungs bilaterally may represent pneumonia or edema.  No significant change since earlier today.   Original Report Authenticated By: Burman Nieves, M.D.   Dg Chest Portable 1 View  04/26/2013   *RADIOLOGY REPORT*  Clinical Data: Shortness of breath and tachycardia.  PORTABLE CHEST - 1 VIEW  Comparison: 02/07/2013.  Findings: The heart is enlarged.  Right middle lobe and left lower lobe airspace disease is present.  Mild pulmonary vascular congestion is noted.  The visualized soft tissues and bony thorax are unremarkable.  IMPRESSION:  1.  Progressive right middle lobe and left lower lobe  airspace disease. 2.  Cardiomegaly and mild pulmonary vascular congestion.   Original Report Authenticated By: Marin Roberts, M.D.   Images viewed by me.   Date: 04/26/2013  Rate: 121  Rhythm: sinus tachycardia  QRS Axis: normal  Intervals: normal  ST/T Wave abnormalities: normal  Conduction Disutrbances:none  Narrative Interpretation: Sinus tachycardia, left atrial hypertrophy. When compared with ECG of 01/09/2013, no significant changes are seen.  Old EKG Reviewed: unchanged   1. Community acquired pneumonia   2. Pulmonary edema   3. End stage renal disease on dialysis   4. DM (diabetes mellitus)   5. History of  renal transplant   6. HCAP (healthcare-associated pneumonia)    CRITICAL CARE Performed by: ZOXWR,UEAVW Total critical care time: 45 minutes Critical care time was exclusive of separately billable procedures and treating other patients. Critical care was necessary to treat or prevent imminent or life-threatening deterioration. Critical care was time spent personally by me on the following activities: development of treatment plan with patient and/or surrogate as well as nursing, discussions with consultants, evaluation of patient's response to treatment, examination of patient, obtaining history from patient or surrogate, ordering and performing treatments and interventions, ordering and review of laboratory studies, ordering and review of radiographic studies, pulse oximetry and re-evaluation of patient's condition.  MDM  Acute dyspnea with possible low-grade fever. It is possible that she has pulmonary edema from her end-stage renal disease but exam does not seem consistent with that. Need to consider possibility of pneumonia.  Chest x-ray appears to show right middle lobe and left lower lobe infiltrates. She started on antibiotics for healthcare associated pneumonia. Arrangements were started to have her admitted but she suddenly became much more dyspneic and diaphoretic. Oxygen requirements increased. On reexam, the rales and coarse wheezes consistent with cardiac asthma. Repeat chest x-ray is showed worsening edema. It was felt at this point that she probably had developed flash pulmonary edema. Case is discussed with Dr. Eliott Nine of nephrology service to arrange for emergent dialysis. Case is also discussed with Dr. Allena Katz of [facility] who agrees to admit the patient on medical service.  Dione Booze, MD 04/27/13 [phone]

## 2013-04-26 NOTE — Plan of Care (Signed)
Problem: Phase I Progression Outcomes Goal: Pain controlled with appropriate interventions Outcome: Progressing Pt pain controlled with APAP Goal: Voiding-avoid urinary catheter unless indicated Outcome: Not Applicable Date Met:  04/26/13 Pt anuric Goal: Hemodynamically stable Outcome: Progressing vss

## 2013-04-27 DIAGNOSIS — J9600 Acute respiratory failure, unspecified whether with hypoxia or hypercapnia: Secondary | ICD-10-CM | POA: Diagnosis present

## 2013-04-27 LAB — IRON AND TIBC
Iron: 105 ug/dL (ref 42–135)
Saturation Ratios: 61 % — ABNORMAL HIGH (ref 20–55)
UIBC: 67 ug/dL — ABNORMAL LOW (ref 125–400)

## 2013-04-27 LAB — CBC
HCT: 21.6 % — ABNORMAL LOW (ref 36.0–46.0)
HCT: 29.1 % — ABNORMAL LOW (ref 36.0–46.0)
Hemoglobin: 9.1 g/dL (ref 12.0–15.0)
MCH: 27.2 pg (ref 26.0–34.0)
MCHC: 31.9 g/dL (ref 30.0–36.0)
MCHC: 31.3 g/dL (ref 30.0–36.0)
MCV: 86.1 fL (ref 78.0–100.0)
Platelets: 305 10*3/uL (ref 150–400)
RDW: 16.5 % — ABNORMAL HIGH (ref 11.5–15.5)
RDW: 15.6 % — ABNORMAL HIGH (ref 11.5–15.5)

## 2013-04-27 LAB — GLUCOSE, CAPILLARY: Glucose-Capillary: 243 mg/dL — ABNORMAL HIGH (ref 70–99)

## 2013-04-27 LAB — RENAL FUNCTION PANEL
Albumin: 2.2 g/dL — ABNORMAL LOW (ref 3.5–5.2)
BUN: 26 mg/dL — ABNORMAL HIGH (ref 6–23)
Chloride: 91 mEq/L — ABNORMAL LOW (ref 96–112)
GFR calc non Af Amer: 10 mL/min — ABNORMAL LOW (ref >90)
Phosphorus: 6 mg/dL — ABNORMAL HIGH (ref 2.3–4.6)
Potassium: 4.3 mEq/L (ref 3.5–5.1)
Sodium: 131 mEq/L — ABNORMAL LOW (ref 135–145)

## 2013-04-27 LAB — PREPARE RBC (CROSSMATCH)

## 2013-04-27 LAB — TACROLIMUS LEVEL: Tacrolimus (FK506) - LabCorp: NOT DETECTED ng/mL

## 2013-04-27 MED ORDER — AMLODIPINE BESYLATE 10 MG PO TABS
10.0000 mg | ORAL_TABLET | Freq: Every day | ORAL | Status: DC
  Administered 2013-04-27: 10 mg via ORAL
  Filled 2013-04-27 (×2): qty 1

## 2013-04-27 MED ORDER — ACETAMINOPHEN 325 MG PO TABS
ORAL_TABLET | ORAL | Status: AC
  Administered 2013-04-27: 650 mg via ORAL
  Filled 2013-04-27: qty 2

## 2013-04-27 MED ORDER — LABETALOL HCL 200 MG PO TABS
200.0000 mg | ORAL_TABLET | Freq: Two times a day (BID) | ORAL | Status: DC
  Administered 2013-04-27 – 2013-04-28 (×2): 200 mg via ORAL
  Filled 2013-04-27 (×3): qty 1

## 2013-04-27 MED ORDER — LANTHANUM CARBONATE 500 MG PO CHEW
1000.0000 mg | CHEWABLE_TABLET | Freq: Three times a day (TID) | ORAL | Status: DC
  Administered 2013-04-27 – 2013-04-28 (×2): 1000 mg via ORAL
  Filled 2013-04-27 (×6): qty 2

## 2013-04-27 MED ORDER — TACROLIMUS 1 MG PO CAPS
1.0000 mg | ORAL_CAPSULE | Freq: Every day | ORAL | Status: DC
  Administered 2013-04-27: 1 mg via ORAL
  Filled 2013-04-27 (×2): qty 1

## 2013-04-27 MED ORDER — LEVOFLOXACIN 750 MG PO TABS
750.0000 mg | ORAL_TABLET | Freq: Once | ORAL | Status: AC
  Administered 2013-04-27: 750 mg via ORAL
  Filled 2013-04-27: qty 1

## 2013-04-27 MED ORDER — DARBEPOETIN ALFA-POLYSORBATE 200 MCG/0.4ML IJ SOLN: 200.0000 ug | INTRAMUSCULAR | Status: DC

## 2013-04-27 MED ORDER — LEVOFLOXACIN 500 MG PO TABS: 500.0000 mg | ORAL_TABLET | ORAL | Status: DC

## 2013-04-27 NOTE — Progress Notes (Signed)
Pt back from HD, states she feels better. VSS, will continue to monitor.

## 2013-04-27 NOTE — Progress Notes (Signed)
ANTIBIOTIC CONSULT NOTE - INITIAL  Pharmacy Consult for po Levofloxacin Indication: bronchitis  Allergies  Allergen Reactions  . Ace Inhibitors Other (See Comments) and Cough     cause cough Per medical records  cause cough    Patient Measurements: Height: 5\' 3"  (160 cm) Weight: 154 lb 1.6 oz (69.9 kg) IBW/kg (Calculated) : 52.4   Vital Signs: Temp: 97.9 F (36.6 C) (07/22 1015) Temp src: Oral (07/22 1015) BP: 166/43 mmHg (07/22 1015) Pulse Rate: 102 (07/22 1015) Intake/Output from previous day: 07/21 0701 - 07/22 0700 In: 410 [P.O.:360; IV Piggyback:50] Out: 6001  Intake/Output from this shift:    Labs:  Recent Labs  04/26/13 0102 04/26/13 0122 04/27/13 0540  WBC 16.6*  --  13.1*  HGB 7.5* 8.5* 6.9*  PLT 351  --  305  CREATININE 6.68* 6.50* 4.98*   Estimated Creatinine Clearance: 14.6 ml/min (by C-G formula based on Cr of 4.98). No results found for this basename: VANCOTROUGH, Leodis Binet, VANCORANDOM, GENTTROUGH, GENTPEAK, GENTRANDOM, TOBRATROUGH, TOBRAPEAK, TOBRARND, AMIKACINPEAK, AMIKACINTROU, AMIKACIN,  in the last 72 hours   Microbiology: Recent Results (from the past 720 hour(s))  MRSA PCR SCREENING     Status: None   Collection Time    04/26/13 11:04 AM      Result Value Range Status   MRSA by PCR NEGATIVE  NEGATIVE Final   Comment:            The GeneXpert MRSA Assay (FDA     approved for NASAL specimens     only), is one component of a     comprehensive MRSA colonization     surveillance program. It is not     intended to diagnose MRSA     infection nor to guide or     monitor treatment for     MRSA infections.    Medical History: Past Medical History  Diagnosis Date  . History of renal transplant 2010  . Diabetic retinopathy   . HTN (hypertension)     not well controlled  . 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 HLD (hyperlipidemia)   . ESRD  on dialysis 10/20/2008    Hx of pancreas-kidney transplant in 2010, started back on dialysis in Oct 2013. Back on insulin also due to failure of pancreatic graft.    [name] ESRD on dialysis     "[facility]; TTS; (01/27/2013)  . History of blood transfusion     "lots of times" (01/27/2013)  . CAP (community acquired pneumonia) 2010  . Shortness of breath     "w/dialysis" (01/27/2013)  . Type II diabetes mellitus   . Anemia   . Ovarian tumor, malignant 2005    resected in [place] pt denies that this was cancer on 01/27/2013   Assessment: [name] admitted with progressive dyspnea, cough, fever, and sputum production. Was started on vanc and cefepime for concern of pneumonia, and now to change to PO levofloxacin for bronchitis. WBC 13.1, afebrile in last 24 hours. On Hd TTS.  Goal of Therapy:  eradication of infection  Plan:  1. Give Levofloxacin 750mg  po x1 today after HD 2. Levofloxacin 500mg  po Q48 started 7/24 3. Follow up cultures/sensitivities and LOT  [name], PharmD Clinical Pharmacist Pager: [phone] 04/27/2013 10:35 AM

## 2013-04-27 NOTE — Procedures (Signed)
I was present at this session.  I have reviewed the session itself and made appropriate changes.  HD via LUA avf.  Vol xs, 2 nd day HD, hb low , repeat  Kanyon Seibold L 7/22/20148:52 AM

## 2013-04-27 NOTE — Progress Notes (Signed)
Utilization review completed.  

## 2013-04-27 NOTE — Progress Notes (Signed)
Subjective: Interval History: none.  Objective: Vital signs in last 24 hours: Temp:  [97.9 F (36.6 C)-100 F (37.8 C)] 98.3 F (36.8 C) (07/22 0718) Pulse Rate:  [88-125] 100 (07/22 0830) Resp:  [13-38] 19 (07/22 0830) BP: (113-180)/(57-90) 154/79 mmHg (07/22 0830) SpO2:  [94 %-100 %] 100 % (07/22 0830) FiO2 (%):  [50 %] 50 % (07/21 1500) Weight:  [69.6 kg (153 lb 7 oz)-69.9 kg (154 lb 1.6 oz)] 69.9 kg (154 lb 1.6 oz) (07/22 0718) Weight change: -6.4 kg (-14 lb 1.8 oz)  Intake/Output from previous day: 07/21 0701 - 07/22 0700 In: 410 [P.O.:360; IV Piggyback:50] Out: 6001  Intake/Output this shift:    General appearance: cooperative, pale and slowed mentation Resp: diminished breath sounds bilaterally and rales bibasilar Cardio: S1, S2 normal and systolic murmur: holosystolic 2/6, blowing at apex GI: soft,pos bs, Extremities: edema 1+. avf LUA and avf  Lab Results:  Recent Labs  04/26/13 0102 04/26/13 0122 04/27/13 0540  WBC 16.6*  --  13.1*  HGB 7.5* 8.5* 6.9*  HCT 23.4* 25.0* 21.6*  PLT 351  --  305   BMET:  Recent Labs  04/26/13 0102 04/26/13 0122 04/27/13 0540  NA 125* 128* 131*  K 5.6* 5.6* 4.3  CL 85* 92* 91*  CO2 26  --  30  GLUCOSE 187* 194* 155*  BUN 46* 45* 26*  CREATININE 6.68* 6.50* 4.98*  CALCIUM 9.6  --  9.7   No results found for this basename: PTH,  in the last 72 hours Iron Studies:  Recent Labs  04/26/13 0552  IRON 31*  TIBC 182*  FERRITIN 2202*    Studies/Results: Dg Chest Port 1 View  04/26/2013   *RADIOLOGY REPORT*  Clinical Data: Pulmonary infiltrates.  PORTABLE CHEST - 1 VIEW  Comparison: Earlier on 04/26/2013 and on 02/07/2013  Findings: There has been significant clearing of the bilateral pulmonary infiltrates consistent with resolving pulmonary edema. Heart size and vascularity are normal.  No effusions.  IMPRESSION: Marked improvement in the bilateral pulmonary edema.   Original Report Authenticated By: Francene Boyers,  M.D.   Dg Chest Portable 1 View  04/26/2013   *RADIOLOGY REPORT*  Clinical Data: Shortness of breath.  Cough.  Tachycardia.  Nausea and vomiting.  PORTABLE CHEST - 1 VIEW  Comparison: 04/26/2013 at 0101 hours.  Findings: Cardiac enlargement with mild pulmonary vascular congestion.  Bilateral basilar air space disease could represent pneumonia or edema.  No blunting of costophrenic angles.  No pneumothorax.  Stable appearance since previous study.  IMPRESSION: Cardiac enlargement with pulmonary vascular congestion.  Airspace disease in the mid and lower lungs bilaterally may represent pneumonia or edema.  No significant change since earlier today.   Original Report Authenticated By: Burman Nieves, M.D.   Dg Chest Portable 1 View  04/26/2013   *RADIOLOGY REPORT*  Clinical Data: Shortness of breath and tachycardia.  PORTABLE CHEST - 1 VIEW  Comparison: 02/07/2013.  Findings: The heart is enlarged.  Right middle lobe and left lower lobe airspace disease is present.  Mild pulmonary vascular congestion is noted.  The visualized soft tissues and bony thorax are unremarkable.  IMPRESSION:  1.  Progressive right middle lobe and left lower lobe airspace disease. 2.  Cardiomegaly and mild pulmonary vascular congestion.   Original Report Authenticated By: Marin Roberts, M.D.    I have reviewed the patient's current medications.  Assessment/Plan: 1 CRF for HD , usual sched, vol xs 2 Anemia start epo, hypoprolif marrow, followed @ [facility] 3  HPTH 4 DM 5 nonadherence 6 ^ p add Fosrenol P HD, epo, binders    LOS: 1 day   [name] 04/27/2013,8:53 AM

## 2013-04-27 NOTE — Progress Notes (Signed)
Pt received 1 unit of PRBCs in hemodialysis, tolerated tx well, no s/s of a rxn noted. Denies itching, SOB,  And pain.

## 2013-04-27 NOTE — Progress Notes (Addendum)
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 Patient presented to the ER with a complaint of shortness of breath,fever, chills and a productive cough. She was admitted for pneumonia and fluid overload and underwent emergent hemodialysis.  Assessment/Plan: Principal Problem:   Acute respiratory failure -Secondary to fluid overload and acute bronchitis  Active Problems:     Pulmonary edema -Treated with dialysis and now resolved  Acute bronchitis -Initially thought to be pneumonia however repeat chest x-ray reveals complete resolution of infiltrates -Patient does admit to a productive cough for at least 4 days with improvement since admission -Will change cefepime and vancomycin to Levaquin for 5 days   HYPERLIPIDEMIA -Continue simvastatin    HYPERTENSION -Resume labetalol and Norvasc    ESRD on dialysis -Was dialyzed emergently last night and again this morning    History of renal transplant Resume Prograf    Anemia -Likely secondary to chronic disease-have ordered one unit of packed red blood cells today-follow hemoglobin    DM (diabetes mellitus) -Continue Lantus and NovoLog at current dose for now  Consultants: Nephrology  Procedures: None  Antibiotics: Vancomycin/cefepime 7/21  Zithromax 7/21 Levaquin 7/22  DVT prophylaxis: Heparin  HPI/Subjective: And evaluated while on dialysis-no complaints of shortness of breath but does admit to a productive cough her past 4-5 days. No complaint of chest pain.   Objective: Blood pressure 152/81, pulse 109, temperature 97.8 F (36.6 C), temperature source Oral, resp. rate 25, height 5\' 3"  (1.6 m), weight 68.3 kg (150 lb 9.2 oz), SpO2 100.00%.  Intake/Output  Summary (Last 24 hours) at 04/27/13 1433 Last data filed at 04/27/13 1300  Gross per 24 hour  Intake    830 ml  Output   4150 ml  Net  -3320 ml     Exam: General: No acute respiratory distress Lungs: Clear to auscultation bilaterally without wheezes or crackles Cardiovascular: Regular rate and rhythm without murmur gallop or rub normal S1 and S2 Abdomen: Nontender, nondistended, soft, bowel sounds positive, no rebound, no ascites, no appreciable mass Extremities: No significant cyanosis, clubbing, or edema bilateral lower extremities  Data Reviewed: Basic Metabolic Panel:  Recent Labs Lab 04/26/13 0102 04/26/13 0122 04/27/13 0540  NA 125* 128* 131*  K 5.6* 5.6* 4.3  CL 85* 92* 91*  CO2 26  --  30  GLUCOSE 187* 194* 155*  BUN 46* 45* 26*  CREATININE 6.68* 6.50* 4.98*  CALCIUM 9.6  --  9.7  MG  --   --  2.2  PHOS  --   --  6.0*   Liver Function Tests:  Recent Labs Lab 04/26/13 0102 04/27/13 0540  AST 10  --   ALT 7  --   ALKPHOS 64  --   BILITOT 0.4  --   PROT 8.2  --   ALBUMIN 2.5* 2.2*   No results found for this basename: LIPASE, AMYLASE,  in the last 168 hours No results found for this basename: AMMONIA,  in the last 168 hours CBC:  Recent Labs Lab 04/26/13 0102 04/26/13 0122 04/27/13 0540 04/27/13 1400  WBC 16.6*  --  13.1* 10.5  NEUTROABS 14.6*  --   --   --   HGB 7.5* 8.5* 6.9* POST TRANSFUSION SPECIMEN  HCT 23.4* 25.0* 21.6* 29.1*  MCV 85.1  --  86.1 87.1  PLT 351  --  305 337   Cardiac Enzymes: No results found for this basename: CKTOTAL, CKMB, CKMBINDEX, TROPONINI,  in the last 168 hours BNP (last 3 results)  Recent Labs  02/01/13 2344  PROBNP 58488.0*   CBG:  Recent Labs Lab 04/26/13 1955  GLUCAP 184*    Recent Results (from the past 240 hour(s))  MRSA PCR SCREENING     Status: None   Collection Time    04/26/13 11:04 AM      Result Value Range Status   MRSA by PCR NEGATIVE  NEGATIVE Final   Comment:            The  GeneXpert MRSA Assay (FDA     approved for NASAL specimens     only), is one component of a     comprehensive MRSA colonization     surveillance program. It is not     intended to diagnose MRSA     infection nor to guide or     monitor treatment for     MRSA infections.     Studies:  Recent x-ray studies have been reviewed in detail by the Attending Physician  Scheduled Meds:  Scheduled Meds: . aspirin EC  81 mg Oral QHS  . calcium acetate  2,001 mg Oral TID WC  . darbepoetin (ARANESP) injection - DIALYSIS  200 mcg Intravenous Q Tue-HD  . heparin  5,000 Units Subcutaneous Q8H  . insulin aspart  6 Units Subcutaneous TID WC  . insulin glargine  35 Units Subcutaneous QHS  . lanthanum  1,000 mg Oral TID WC  . levofloxacin  750 mg Oral Once   Followed by  . [START ON 04/29/2013] levofloxacin  500 mg Oral Q48H  . metoCLOPramide  5 mg Oral QID  . multivitamin  1 tablet Oral QHS  . predniSONE  5 mg Oral QHS  . simvastatin  20 mg Oral Q M,W,F   Continuous Infusions:   Time spent on care of this patient: 35 minutes   [facility]  [facility] Office  [phone] Pager - Text Page per Loretha Stapler as per below:  On-Call/Text Page:      [email or web address]      password [email or web address]  If 7PM-7AM, please contact night-coverage [email or web address] Password [facility] 04/27/2013, 2:33 PM   LOS: 1 day

## 2013-04-28 LAB — CBC
Hemoglobin: 8.1 g/dL — ABNORMAL LOW (ref 12.0–15.0)
MCH: 27.6 pg (ref 26.0–34.0)
MCHC: 32.4 g/dL (ref 30.0–36.0)
MCV: 85 fL (ref 78.0–100.0)
RBC: 2.94 MIL/uL — ABNORMAL LOW (ref 3.87–5.11)

## 2013-04-28 LAB — TYPE AND SCREEN
ABO/RH(D): AB POS
Unit division: 0

## 2013-04-28 LAB — GLUCOSE, CAPILLARY: Glucose-Capillary: 167 mg/dL — ABNORMAL HIGH (ref 70–99)

## 2013-04-28 LAB — RENAL FUNCTION PANEL
BUN: 30 mg/dL — ABNORMAL HIGH (ref 6–23)
CO2: 26 mEq/L (ref 19–32)
Calcium: 10.2 mg/dL (ref 8.4–10.5)
Creatinine, Ser: 4.17 mg/dL — ABNORMAL HIGH (ref 0.50–1.10)
Glucose, Bld: 242 mg/dL — ABNORMAL HIGH (ref 70–99)
Phosphorus: 3.4 mg/dL (ref 2.3–4.6)
Sodium: 131 mEq/L — ABNORMAL LOW (ref 135–145)

## 2013-04-28 MED ORDER — LEVOFLOXACIN 500 MG PO TABS: 500.0000 mg | ORAL_TABLET | ORAL | Status: DC

## 2013-04-28 MED ORDER — LANTHANUM CARBONATE 1000 MG PO CHEW: 1000.0000 mg | CHEWABLE_TABLET | Freq: Three times a day (TID) | ORAL | Status: DC

## 2013-04-28 NOTE — Clinical Documentation Improvement (Signed)
THIS DOCUMENT IS NOT A PERMANENT PART OF THE MEDICAL RECORD  Please update your documentation with the medical record to reflect your response to this query. If you need help knowing how to do this please call [phone].  04/28/13  Dear Dr. Sharon Seller,  In a better effort to capture your patient's severity of illness, reflect appropriate length of stay and utilization of resources, a review of the patient medical record has revealed the following indicators.   Based on your clinical judgment, please clarify and document in a progress note and/or discharge summary the clinical condition associated with the following supporting information: In responding to this query please exercise your independent judgment.  The fact that a query is asked, does not imply that any particular answer is desired or expected.    Hello Dr. Sharon Seller!  [name] was admitted with Pneumonia, Acute Bronchitis, and Acute Respiratory Failure. In the H&P "potential sepsis" was mentioned. Please help, if possible, by documenting if this condition is/was present. Thank you!  **If present, also please document if it was POA (present on admission).  Possible Clinical Conditions?  - Sepsis  - Possible Sepsis  - Other condition (please document in the progress notes and/or discharge summary)  - Cannot Clinically determine at this time   Supporting Information:  - "holding meds now given potential sepsis with source, tachycardia, tachypnea, WBC, undergoing HD, restart as indicated" H&P  - HR on admission:  123, 124, 122  - Temp on admission:  100, 99.4  - Respirations on admission:  30, 30, 36  - WBCs on admission:  16.6  - Neutrophils on admission:  88     Reviewed:  no additional documentation provided  Thank Darden Palmer  Clinical Documentation Specialist: [id number] Health Information Management [place]

## 2013-04-28 NOTE — Discharge Summary (Signed)
Physician Discharge Summary  [name] XBJ:[id number] DOB: 1975-10-21 DOA: 04/26/2013  PCP: Juline Patch, MD  Admit date: 04/26/2013 Discharge date: 04/28/2013  Time spent: >30 minutes  Recommendations for Outpatient Follow-up:  1. Keep usual dialysis appointments for T-TH-SAT 2. Keep appointment with Dr. Loh/Pulmonology at [facility] for 06/22/2013 3. Keep appointment with Dr. 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 She was admitted for pneumonia and fluid overload and underwent emergent hemodialysis  Discharge Diagnoses/Hospital Course:   Acute respiratory failure due to: A) Pulmonary edema  B) Acute bronchitis  -Secondary to fluid overload and acute bronchitis  -Treated with dialysis and now resolved  -Initially thought to be pneumonia however repeat chest x-ray revealed complete resolution of infiltrates  -Patient did admit to a productive cough for at least 4 days with improvement since admission  -Changed cefepime and vancomycin to Levaquin for 5 days duration which will continue after discharge   HYPERLIPIDEMIA  -Continue simvastatin   HYPERTENSION  -Continue Labetalol and Norvasc   ESRD on dialysis  -Was dialyzed emergently early AM following admission and again Tuesday -resume prior OP HD regimen -Fosrenal added this admission - dry weight lowered and Nephrologist has updated patient's PCP  History of renal transplant  -Continue Prograf   Anemia  -Chronic recurrent problem followed at [facility] by Hematology - has undergone previous extensive GI evaluations -did receive one unit PRBC's this admit for Hgb of 6.9 -Baseline Hgb between 7-8 on Aranesp with HD -Prior assessment from recent [facility]  follow up: 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 No evidence of lymphoma on the bone marrow biopsy. Hyporesponsiveness to ESAs has been reported in patients s/p failed kidney transplant. She does have an elevated reticulocyte count, although LDH and haptoglobin have been normal. Will check DAT today to rule out autoimmune hemolytic anemia. Pure red cell aplasia would be a consideration as she is on erythropoietin however in this case is not likely based on elevated reticulocyte count and normal erythroid precursors on bone marrow biopsy.  -Of note DAT was negative -pt without active signs of bleeding but with resting tachycardia so attending MD concerned she may be experiencing symptomatic anemia. Pt assured MD that her anemia is chronic and that she really needs to go home despite MD recommendations to stay. -Needs CBC either this Thursday or Saturday during HD treatment - Dr. 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 PET scan showed no hypermetabolic activity, favoring inflammatory causes. Given these findings lymphoma is unlikely, and I recommended that she return in 3 months with followup CT of the a/p for further assessment. She is in agreement.   Discharge Condition: Stable although Dr. Sharon Seller recommended additional inpatient stay of 24 hours since concerned pt may be experiencing symptoms related to her anemia - the pt politely declined, and requested D/C home.    Diet recommendation: Carb modified -Renal  Filed Weights   04/26/13 1012 04/27/13 0718 04/27/13 1201  Weight: 69.6 kg (  153 lb 7 oz) 69.9 kg (154 lb 1.6 oz) 68.3 kg (150 lb 9.2 oz)     Procedures:  None  Consultations:  Nephrology  Discharge Exam: Filed Vitals:   04/28/13 0639 04/28/13 0700 04/28/13 0706 04/28/13 1143  BP: 165/76 160/77  163/60  Pulse: 103 104    Temp:   98 F (36.7 C) 98.1 F (36.7 C)  TempSrc:   Oral Oral  Resp: 16 26  17   Height:      Weight:      SpO2: 99% 98%  100%   General: No acute respiratory distress  Lungs: Clear to auscultation bilaterally without wheezes or crackles  Cardiovascular: Tachycardic rate and regular rhythm without murmur gallop or rub normal S1 and S2  Abdomen: Nontender, nondistended, soft, bowel sounds positive, no rebound, no ascites, no appreciable mass  Extremities: No significant cyanosis, clubbing, or edema bilateral lower extremities   Discharge Instructions      Discharge Orders   Future Orders Complete By Expires     CBC  As directed     Comments:      Please obtain with next dialysis treatment    Diet Carb Modified  As directed     Scheduling Instructions:      RENAL    Increase activity slowly  As directed         Medication List         amLODipine 10 MG tablet  Commonly known as:  NORVASC  Take 10 mg by mouth at bedtime.     aspirin EC 81 MG tablet  Take 81 mg by mouth at bedtime.     calcium acetate 667 MG capsule  Commonly known as:  PHOSLO  Take 3 capsules (2,001 mg total) by mouth 3 (three) times daily with meals.     insulin aspart 100 UNIT/ML injection  Commonly known as:  novoLOG  Inject 12 Units into the skin 3 (three) times daily with meals.     insulin glargine 100 UNIT/ML injection  Commonly known as:  LANTUS  Inject 63 Units into the skin at bedtime.     labetalol 100 MG tablet  Commonly known as:  NORMODYNE  Take 200 mg by mouth 2 (two) times daily.     levofloxacin 500 MG tablet  Commonly known as:  LEVAQUIN  Take 1 tablet (500 mg total) by mouth every other day.  Start taking on:  04/29/2013     metoCLOPramide 5 MG tablet  Commonly known as:   REGLAN  Take 5 mg by mouth 4 (four) times daily.     predniSONE 5 MG tablet  Commonly known as:  DELTASONE  Take 5 mg by mouth at bedtime.     simvastatin 20 MG tablet  Commonly known as:  ZOCOR  Take 20 mg by mouth every Monday, Wednesday, and Friday.     tacrolimus 1 MG capsule  Commonly known as:  PROGRAF  Take 1 mg by mouth at bedtime.       Allergies  Allergen Reactions  . Ace Inhibitors Other (See Comments) and Cough     cause cough Per medical records  cause cough   Follow-up Information   Follow up with [facility]. (Keep usual dialysis appointments at your usual dialysis center)    Contact information:   [address] [place] [address] [phone]      Follow up On 06/22/2013.   Contact information:   Dr. Kaylyn Layer  Follow up On 07/19/2013.   Contact information:   Dr. Swift/Hematology      Call Juline Patch, MD. (to arrange for appointment in 1-2 weeks after discharge)    Contact information:   [address], [address] [place] [address] [phone]       The results of significant diagnostics from this hospitalization (including imaging, microbiology, ancillary and laboratory) are listed below for reference.    Significant Diagnostic Studies: Dg Chest Port 1 View  04/26/2013   *RADIOLOGY REPORT*  Clinical Data: Pulmonary infiltrates.  PORTABLE CHEST - 1 VIEW  Comparison: Earlier on 04/26/2013 and on 02/07/2013  Findings: There has been significant clearing of the bilateral pulmonary infiltrates consistent with resolving pulmonary edema. Heart size and vascularity are normal.  No effusions.  IMPRESSION: Marked improvement in the bilateral pulmonary edema.   Original Report Authenticated By: Francene Boyers, M.D.   Dg Chest Portable 1 View  04/26/2013   *RADIOLOGY REPORT*  Clinical Data: Shortness of breath.  Cough.  Tachycardia.  Nausea and vomiting.  PORTABLE CHEST - 1 VIEW  Comparison: 04/26/2013 at 0101 hours.   Findings: Cardiac enlargement with mild pulmonary vascular congestion.  Bilateral basilar air space disease could represent pneumonia or edema.  No blunting of costophrenic angles.  No pneumothorax.  Stable appearance since previous study.  IMPRESSION: Cardiac enlargement with pulmonary vascular congestion.  Airspace disease in the mid and lower lungs bilaterally may represent pneumonia or edema.  No significant change since earlier today.   Original Report Authenticated By: Burman Nieves, M.D.   Dg Chest Portable 1 View  04/26/2013   *RADIOLOGY REPORT*  Clinical Data: Shortness of breath and tachycardia.  PORTABLE CHEST - 1 VIEW  Comparison: 02/07/2013.  Findings: The heart is enlarged.  Right middle lobe and left lower lobe airspace disease is present.  Mild pulmonary vascular congestion is noted.  The visualized soft tissues and bony thorax are unremarkable.  IMPRESSION:  1.  Progressive right middle lobe and left lower lobe airspace disease. 2.  Cardiomegaly and mild pulmonary vascular congestion.   Original Report Authenticated By: Marin Roberts, M.D.    Microbiology: Recent Results (from the past 240 hour(s))  MRSA PCR SCREENING     Status: None   Collection Time    04/26/13 11:04 AM      Result Value Range Status   MRSA by PCR NEGATIVE  NEGATIVE Final   Comment:            The GeneXpert MRSA Assay (FDA     approved for NASAL specimens     only), is one component of a     comprehensive MRSA colonization     surveillance program. It is not     intended to diagnose MRSA     infection nor to guide or     monitor treatment for     MRSA infections.  CULTURE, BLOOD (ROUTINE X 2)     Status: None   Collection Time    04/26/13  2:20 PM      Result Value Range Status   Specimen Description BLOOD ARM RIGHT   Final   Special Requests BOTTLES DRAWN AEROBIC ONLY 2CC   Final   Culture  Setup Time 04/27/2013 01:19   Final   Culture     Final   Value:        BLOOD CULTURE RECEIVED NO  GROWTH TO DATE CULTURE WILL BE HELD FOR 5 DAYS BEFORE ISSUING A FINAL NEGATIVE REPORT   Report  Status PENDING   Incomplete  CULTURE, BLOOD (ROUTINE X 2)     Status: None   Collection Time    04/26/13  2:25 PM      Result Value Range Status   Specimen Description BLOOD HAND RIGHT   Final   Special Requests BOTTLES DRAWN AEROBIC ONLY 2CC   Final   Culture  Setup Time 04/27/2013 01:31   Final   Culture     Final   Value:        BLOOD CULTURE RECEIVED NO GROWTH TO DATE CULTURE WILL BE HELD FOR 5 DAYS BEFORE ISSUING A FINAL NEGATIVE REPORT   Report Status PENDING   Incomplete     Labs: Basic Metabolic Panel:  Recent Labs Lab 04/26/13 0102 04/26/13 0122 04/27/13 0540 04/28/13 0500  NA 125* 128* 131* 131*  K 5.6* 5.6* 4.3 4.2  CL 85* 92* 91* 92*  CO2 26  --  30 26  GLUCOSE 187* 194* 155* 242*  BUN 46* 45* 26* 30*  CREATININE 6.68* 6.50* 4.98* 4.17*  CALCIUM 9.6  --  9.7 10.2  MG  --   --  2.2  --   PHOS  --   --  6.0* 3.4   Liver Function Tests:  Recent Labs Lab 04/26/13 0102 04/27/13 0540 04/28/13 0500  AST 10  --   --   ALT 7  --   --   ALKPHOS 64  --   --   BILITOT 0.4  --   --   PROT 8.2  --   --   ALBUMIN 2.5* 2.2* 2.4*   CBC:  Recent Labs Lab 04/26/13 0102 04/26/13 0122 04/27/13 0540 04/27/13 1400 04/28/13 0534  WBC 16.6*  --  13.1* 10.5 11.9*  NEUTROABS 14.6*  --   --   --   --   HGB 7.5* 8.5* 6.9* 9.1 8.1*  HCT 23.4* 25.0* 21.6* 29.1* 25.0*  MCV 85.1  --  86.1 87.1 85.0  PLT 351  --  305 337 296   BNP: BNP (last 3 results)  Recent Labs  02/01/13 2344  PROBNP 58488.0*   CBG:  Recent Labs Lab 04/26/13 1955 04/27/13 1649 04/27/13 2141 04/28/13 0712  GLUCAP 184* 243* 218* 167*    Signed:  ELLIS,ALLISON L. ANP [facility] 04/28/2013, 1:59 PM   I have personally examined this patient and reviewed the entire database. I have reviewed the above note, made any necessary editorial changes, and agree with its content.  Lonia Blood, MD [facility]

## 2013-04-28 NOTE — Discharge Instructions (Signed)
Dialysis Diet A dialysis diet is a diet used when you start dialysis treatment. During dialysis, wastes are removed from your blood when your kidneys cannot. Different foods create different wastes in your blood. Following the dialysis diet will lessen how much waste builds up in your body. It will also improve your dialysis and your health. FLUIDS  Each person on dialysis can have a different amount of fluid each day. Follow your dietitians advice. Too much fluids can cause puffiness (swelling) and weight gain. This affects your blood pressure and can make your heart work harder.  Foods can also add fluid to your diet. These foods include:  Foods that are liquid at room temperature. This can include soup, gelatin dessert, and ice cream.  Certain fruits and vegetables. This can include melons, grapes, apples, oranges, tomatoes, lettuce, and celery. POTASSIUM Potassium is a mineral found in foods and drinks and should be avoided. It affects how steadily your heart beats. If you eat high-potassium foods, eat smaller amounts. For example, eat half a pear instead of a whole pear. Talk to your dietitian about foods you can eat instead of high-potassium foods.  Some high-potassium foods and drinks to avoid include:  Apricots.  Lima beans.  Oranges.  Spinach.  Avocados.  Milk.  Melons.  Peanuts.  Prunes.  Tomatoes.  Bananas.  Cantaloupe.  Kiwi fruit.  Asparagus spears.  Raisins.  Winter squash.  Beets.  Orange juice.  Potatoes.  Yogurt. PHOSPHORUS  Phosphorus is a mineral found in foods. Avoid eating foods high in phosphorus. This includes milk, cheese, dried beans, peas, colas, nuts, and peanut butter. You may only be able to have  cup of milk a day. Follow your dietitian's advice about what you can eat and drink. PROTEIN  You may be told to eat as much "high quality protein" as you can. This type of protein comes from meat, fish, chicken, [place], and eggs. Low-fat  meats (lean) are best. If you do not eat meat or animal products, ask your dietitian about ways to get protein. Low-fat milk may be a good protein choice. However, it is high in phosporus, potassium, and adds to your daily fluid amount. Talk to your dietitian to see if milk fits into your food plan.  SODIUM  Sodium is found in foods and drinks and makes you thirsty. Eat foods that are naturally low in sodium. Talk to your dietitian about foods and spices without sodium. Stay away from canned foods and frozen dinners. Look for products labeled "low sodium." Do not use salt-like products called "salt substitutes."  CALORIES  You get calories from food. Calories give you energy. You may need to eat more or less calories. This depends on if you need to gain or lose weight. Talk to your dietitan about foods that are right for your situation. VITAMINS AND MINERALS Vitamins and minerals may be missing from your diet. This is because you have to avoid so many foods. Talk to your dietitian or kidney doctor before choosing a vitaminal or mineral pill (supplement). Many of these pills could be harmful to you. Document Released: 03/24/2012 Document Reviewed: 03/24/2012 [facility] Patient Information 2014 [place], [place]. Bronchitis Bronchitis is a problem of the air tubes leading to your lungs. This problem makes it hard for air to get in and out of the lungs. You may cough a lot because your air tubes are narrow. Going without care can cause lasting (chronic) bronchitis. HOME CARE   Drink enough fluids to keep your  pee (urine) clear or pale yellow.  Use a cool mist humidifier.  Quit smoking if you smoke. If you keep smoking, the bronchitis might not get better.  Only take medicine as told by your doctor. GET HELP RIGHT AWAY IF:   Coughing keeps you awake.  You start to wheeze.  You become more sick or weak.  You have a hard time breathing or get short of breath.  You cough up blood.  Coughing  lasts more than 2 weeks.  You have a fever.  Your baby is older than 3 months with a rectal temperature of 102 F (38.9 C) or higher.  Your baby is 38 months old or younger with a rectal temperature of 100.4 F (38 C) or higher. MAKE SURE YOU:  Understand these instructions.  Will watch your condition.  Will get help right away if you are not doing well or get worse. Document Released: 03/11/2008 Document Revised: 12/16/2011 Document Reviewed: 08/25/2009 [facility] Patient Information 2014 [place], [place].

## 2013-04-28 NOTE — Progress Notes (Signed)
Subjective: Feeling much better, SOB resolved   Recent Labs Lab 04/26/13 0102  04/27/13 0540 04/27/13 1400 04/28/13 0534  WBC 16.6*  --  13.1* 10.5 11.9*  NEUTROABS 14.6*  --   --   --   --   HGB 7.5*  < > 6.9* 9.1 8.1*  HCT 23.4*  < > 21.6* 29.1* 25.0*  MCV 85.1  --  86.1 87.1 85.0  PLT 351  --  305 337 296  < > = values in this interval not displayed.  Recent Labs Lab 04/26/13 0102 04/26/13 0122 04/27/13 0540 04/28/13 0500  NA 125* 128* 131* 131*  K 5.6* 5.6* 4.3 4.2  CL 85* 92* 91* 92*  CO2 26  --  30 26  GLUCOSE 187* 194* 155* 242*  BUN 46* 45* 26* 30*  CREATININE 6.68* 6.50* 4.98* 4.17*  CALCIUM 9.6  --  9.7 10.2  PHOS  --   --  6.0* 3.4   Physical Exam:  Blood pressure 160/77, pulse 104, temperature 98 F (36.7 C), temperature source Oral, resp. rate 26, height 5\' 3"  (1.6 m), weight 68.3 kg (150 lb 9.2 oz), SpO2 98.00%. Gen: cooperative, pale and slowed mentation  Resp: dim breath sounds bilaterally and rales bibasilar  Cardio: S1, S2 normal and systolic murmur: holosystolic 2/6, blowing at apex  GI: soft,pos bs,  Extremities: edema 1+. avf LUA and avf   Outpatient Dialysis: (TTS [facility]) F180   3.5hrs   Dry wt 73kg    Bath 2K, 2.25Ca EPO 20000 units    Hectorol 4ug  Assessment/Plan:  1 CRF, has lost lean body wt 2 Anemia- epo, hypoprolif marrow, followed @ [facility]  3 Pulm edema- resolved 4 DM  5 nonadherence  6 ^ p add Fosrenol  7 HPTH   P OK for d/c from renal standpoint, will check CBC next HD, will lower dry wt, have d/w primary MD  Vinson Moselle  MD Pager [phone]    Cell  [phone] 04/28/2013, 11:13 AM

## 2013-04-28 NOTE — Progress Notes (Signed)
Pt d/c home this afternoon, VSS. Reviewed instructions, f/up appts, etc.

## 2013-04-28 NOTE — Clinical Documentation Improvement (Signed)
THIS DOCUMENT IS NOT A PERMANENT PART OF THE MEDICAL RECORD  Please update your documentation with the medical record to reflect your response to this query. If you need help knowing how to do this please call [phone].  04/28/13  Dear Dr. Sharon Seller,  In a better effort to capture your patient's severity of illness, reflect appropriate length of stay and utilization of resources, a review of the patient medical record has revealed the following indicators.   Based on your clinical judgment, please clarify and document in a progress note and/or discharge summary the clinical condition associated with the following supporting information: In responding to this query please exercise your independent judgment.  The fact that a query is asked, does not imply that any particular answer is desired or expected.   Hello Dr. Sharon Seller!  Could you please help, if possible, by documenting the significance of the below abnormal laboratory values from admission. Thank you for your time!  Possible Clinical Conditions? (please feel free to add "resolved"/"resolving" as appropriate)  - Acute Respiratory Alkalosis      - Acute Metabolic Alkalosis  - Other condition (please document in the progress notes and/or discharge summary)  - Cannot Clinically determine at this time   Supporting Information:  Component      pH, Arterial pCO2 arterial pO2, Arterial  Latest Ref Rng      7.350 - 7.450 35.0 - 45.0 mmHg 80.0 - 100.0 mmHg  04/26/2013     11:45 AM 7.509 (H) 38.7 52.9 (L)   Component      Bicarbonate  Latest Ref Rng      20.0 - 24.0 mEq/L  04/26/2013     11:45 AM 30.6 (H)     **You may use possible, probable, or suspect with inpatient documentation. possible, probable, suspected diagnoses MUST be documented at the time of discharge  Reviewed:  no additional documentation provided  Thank You,  Saul Fordyce  Clinical Documentation Specialist: [phone] Health Information  Management [place]

## 2013-05-03 LAB — CULTURE, BLOOD (ROUTINE X 2): Culture: NO GROWTH

## 2013-08-11 ENCOUNTER — Ambulatory Visit (INDEPENDENT_AMBULATORY_CARE_PROVIDER_SITE_OTHER): Payer: Medicare Other | Admitting: Podiatry

## 2013-08-11 ENCOUNTER — Encounter: Payer: Self-pay | Admitting: Podiatry

## 2013-08-11 VITALS — HR 104 | Ht 63.0 in | Wt 174.0 lb

## 2013-08-11 DIAGNOSIS — L600 Ingrowing nail: Secondary | ICD-10-CM

## 2013-08-11 DIAGNOSIS — L03039 Cellulitis of unspecified toe: Secondary | ICD-10-CM

## 2013-08-11 NOTE — Progress Notes (Signed)
Patient presents with painful ingrown right great toe nail. The peri ungal area of skin has been pulled off and bleeding at distal end.  Stated that she was picking on it. She has lost toe nail 2nd digit both feet and 5th digit on right.  Objective: Ingrown nail both border right hallux. Loss of nail 2nd bilateral, 5th on right. Bleeding toe right hallux. Diminished sensory perception. Pedal pulse is normal.  Subjective: Paronychia right hallux with ingrown nail.  Plan: Phenol and Alcohol matrixectomy of right great toe nail both borders done under Local anesthetic with 0.5% marcaine plain and 1% Xylocaine with epinephrine total 5 ml.  Patient tolerated well. Home care instruction and supply dispensed. Return in one week.

## 2013-08-11 NOTE — Patient Instructions (Addendum)
Seen for ingrown nail. P&A Matrixectomy of right great toe both borders done.  Follow written instruction.

## 2013-08-18 ENCOUNTER — Encounter: Payer: Self-pay | Admitting: Podiatry

## 2013-08-18 ENCOUNTER — Ambulatory Visit (INDEPENDENT_AMBULATORY_CARE_PROVIDER_SITE_OTHER): Payer: Medicare Other | Admitting: Podiatry

## 2013-08-18 DIAGNOSIS — L03039 Cellulitis of unspecified toe: Secondary | ICD-10-CM

## 2013-08-18 NOTE — Progress Notes (Signed)
Follow up visit since Phenol and Alcohol matrixectomy. Nail borders healed mostly with minimum drainage. Patient continues to pick on toes and have bleeding toe 2nd digits bilateral.   Assessment: Satisfactory wound healing following matrixectomy both borders of right great toe.  Plan: Continue to soak for another week.

## 2013-08-18 NOTE — Patient Instructions (Addendum)
Follow up visit after ingrown nail right great toe. Continue to soak daily for one more week.

## 2013-08-23 ENCOUNTER — Encounter (HOSPITAL_COMMUNITY): Payer: Self-pay | Admitting: Emergency Medicine

## 2013-08-23 ENCOUNTER — Inpatient Hospital Stay (HOSPITAL_COMMUNITY): Payer: Medicare Other

## 2013-08-23 ENCOUNTER — Emergency Department (HOSPITAL_COMMUNITY): Payer: Medicare Other

## 2013-08-23 ENCOUNTER — Inpatient Hospital Stay (HOSPITAL_COMMUNITY)
Admission: EM | Admit: 2013-08-23 | Discharge: 2013-08-25 | DRG: 640 | Disposition: A | Discharge status: Home / Self Care | Payer: Medicare Other | Attending: Nephrology | Admitting: Nephrology

## 2013-08-23 DIAGNOSIS — N2581 Secondary hyperparathyroidism of renal origin: Secondary | ICD-10-CM | POA: Diagnosis present

## 2013-08-23 DIAGNOSIS — E785 Hyperlipidemia, unspecified: Secondary | ICD-10-CM | POA: Diagnosis present

## 2013-08-23 DIAGNOSIS — Z800 Family history of malignant neoplasm of digestive organs: Secondary | ICD-10-CM

## 2013-08-23 DIAGNOSIS — N186 End stage renal disease: Secondary | ICD-10-CM | POA: Diagnosis present

## 2013-08-23 DIAGNOSIS — Z992 Dependence on renal dialysis: Secondary | ICD-10-CM

## 2013-08-23 DIAGNOSIS — Z940 Kidney transplant status: Secondary | ICD-10-CM

## 2013-08-23 DIAGNOSIS — E875 Hyperkalemia: Principal | ICD-10-CM | POA: Diagnosis present

## 2013-08-23 DIAGNOSIS — Z9483 Pancreas transplant status: Secondary | ICD-10-CM

## 2013-08-23 DIAGNOSIS — E871 Hypo-osmolality and hyponatremia: Secondary | ICD-10-CM | POA: Diagnosis present

## 2013-08-23 DIAGNOSIS — Z794 Long term (current) use of insulin: Secondary | ICD-10-CM

## 2013-08-23 DIAGNOSIS — R0600 Dyspnea, unspecified: Secondary | ICD-10-CM | POA: Diagnosis present

## 2013-08-23 DIAGNOSIS — Z833 Family history of diabetes mellitus: Secondary | ICD-10-CM

## 2013-08-23 DIAGNOSIS — D631 Anemia in chronic kidney disease: Secondary | ICD-10-CM | POA: Diagnosis present

## 2013-08-23 DIAGNOSIS — E1139 Type 2 diabetes mellitus with other diabetic ophthalmic complication: Secondary | ICD-10-CM | POA: Diagnosis present

## 2013-08-23 DIAGNOSIS — J810 Acute pulmonary edema: Secondary | ICD-10-CM | POA: Diagnosis present

## 2013-08-23 DIAGNOSIS — IMO0002 Reserved for concepts with insufficient information to code with codable children: Secondary | ICD-10-CM

## 2013-08-23 DIAGNOSIS — E11319 Type 2 diabetes mellitus with unspecified diabetic retinopathy without macular edema: Secondary | ICD-10-CM | POA: Diagnosis present

## 2013-08-23 DIAGNOSIS — J811 Chronic pulmonary edema: Secondary | ICD-10-CM | POA: Diagnosis present

## 2013-08-23 DIAGNOSIS — I120 Hypertensive chronic kidney disease with stage 5 chronic kidney disease or end stage renal disease: Secondary | ICD-10-CM | POA: Diagnosis present

## 2013-08-23 HISTORY — DX: Type 2 diabetes mellitus with unspecified diabetic retinopathy without macular edema: E11.319

## 2013-08-23 LAB — CBC WITH DIFFERENTIAL/PLATELET
Eosinophils Relative: 3 % (ref 0–5)
HCT: 31.5 % — ABNORMAL LOW (ref 36.0–46.0)
Lymphocytes Relative: 13 % (ref 12–46)
Lymphs Abs: 1.8 10*3/uL (ref 0.7–4.0)
MCH: 32.2 pg (ref 26.0–34.0)
MCV: 93.2 fL (ref 78.0–100.0)
Monocytes Absolute: 0.5 10*3/uL (ref 0.1–1.0)
Monocytes Relative: 4 % (ref 3–12)
RBC: 3.38 MIL/uL — ABNORMAL LOW (ref 3.87–5.11)
WBC: 13.2 10*3/uL — ABNORMAL HIGH (ref 4.0–10.5)

## 2013-08-23 LAB — BASIC METABOLIC PANEL
BUN: 76 mg/dL — ABNORMAL HIGH (ref 6–23)
CO2: 18 mEq/L — ABNORMAL LOW (ref 19–32)
Calcium: 11.6 mg/dL — ABNORMAL HIGH (ref 8.4–10.5)
Chloride: 84 mEq/L — ABNORMAL LOW (ref 96–112)
Creatinine, Ser: 9.64 mg/dL — ABNORMAL HIGH (ref 0.50–1.10)
Glucose, Bld: 131 mg/dL — ABNORMAL HIGH (ref 70–99)
Sodium: 125 mEq/L — ABNORMAL LOW (ref 135–145)

## 2013-08-23 LAB — GLUCOSE, CAPILLARY: Glucose-Capillary: 202 mg/dL — ABNORMAL HIGH (ref 70–99)

## 2013-08-23 MED ORDER — PENTAFLUOROPROP-TETRAFLUOROETH EX AERO: 1.0000 "application " | INHALATION_SPRAY | CUTANEOUS | Status: DC | PRN

## 2013-08-23 MED ORDER — CALCIUM ACETATE 667 MG PO CAPS
2001.0000 mg | ORAL_CAPSULE | Freq: Three times a day (TID) | ORAL | Status: DC
  Administered 2013-08-24 – 2013-08-25 (×3): 2001 mg via ORAL
  Filled 2013-08-23 (×7): qty 3

## 2013-08-23 MED ORDER — ACETAMINOPHEN 325 MG PO TABS: 650.0000 mg | ORAL_TABLET | Freq: Four times a day (QID) | ORAL | Status: DC | PRN

## 2013-08-23 MED ORDER — METOCLOPRAMIDE HCL 5 MG PO TABS
5.0000 mg | ORAL_TABLET | Freq: Four times a day (QID) | ORAL | Status: DC
  Administered 2013-08-24 – 2013-08-25 (×6): 5 mg via ORAL
  Filled 2013-08-23 (×9): qty 1

## 2013-08-23 MED ORDER — DEXTROSE 50 % IV SOLN
50.0000 mL | Freq: Once | INTRAVENOUS | Status: AC
  Administered 2013-08-23: 50 mL via INTRAVENOUS
  Filled 2013-08-23: qty 50

## 2013-08-23 MED ORDER — HYDROCODONE-ACETAMINOPHEN 5-325 MG PO TABS: 1.0000 | ORAL_TABLET | ORAL | Status: DC | PRN

## 2013-08-23 MED ORDER — INSULIN ASPART 100 UNIT/ML ~~LOC~~ SOLN
0.0000 [IU] | Freq: Every day | SUBCUTANEOUS | Status: DC
  Administered 2013-08-24: 2 [IU] via SUBCUTANEOUS

## 2013-08-23 MED ORDER — INSULIN ASPART 100 UNIT/ML ~~LOC~~ SOLN
10.0000 [IU] | Freq: Once | SUBCUTANEOUS | Status: AC
  Administered 2013-08-23: 10 [IU] via INTRAVENOUS
  Filled 2013-08-23: qty 1

## 2013-08-23 MED ORDER — LIDOCAINE HCL (PF) 1 % IJ SOLN: 5.0000 mL | INTRAMUSCULAR | Status: DC | PRN

## 2013-08-23 MED ORDER — DARBEPOETIN ALFA-POLYSORBATE 100 MCG/0.5ML IJ SOLN: 100.0000 ug | INTRAMUSCULAR | Status: DC

## 2013-08-23 MED ORDER — INSULIN ASPART 100 UNIT/ML ~~LOC~~ SOLN
0.0000 [IU] | Freq: Three times a day (TID) | SUBCUTANEOUS | Status: DC
  Administered 2013-08-24: 3 [IU] via SUBCUTANEOUS
  Administered 2013-08-24: 8 [IU] via SUBCUTANEOUS

## 2013-08-23 MED ORDER — ACETAMINOPHEN 650 MG RE SUPP: 650.0000 mg | Freq: Four times a day (QID) | RECTAL | Status: DC | PRN

## 2013-08-23 MED ORDER — RENA-VITE PO TABS
1.0000 | ORAL_TABLET | Freq: Every day | ORAL | Status: DC
  Administered 2013-08-24 – 2013-08-25 (×2): 1 via ORAL
  Filled 2013-08-23 (×2): qty 1

## 2013-08-23 MED ORDER — NEPRO/CARBSTEADY PO LIQD: 237.0000 mL | ORAL | Status: DC | PRN

## 2013-08-23 MED ORDER — ONDANSETRON HCL 4 MG/2ML IJ SOLN: 4.0000 mg | Freq: Four times a day (QID) | INTRAMUSCULAR | Status: DC | PRN

## 2013-08-23 MED ORDER — SIMVASTATIN 20 MG PO TABS
20.0000 mg | ORAL_TABLET | ORAL | Status: DC
  Administered 2013-08-25: 20 mg via ORAL
  Filled 2013-08-23: qty 1

## 2013-08-23 MED ORDER — BISACODYL 5 MG PO TBEC: 5.0000 mg | DELAYED_RELEASE_TABLET | Freq: Every day | ORAL | Status: DC | PRN

## 2013-08-23 MED ORDER — PREDNISONE 5 MG PO TABS
5.0000 mg | ORAL_TABLET | ORAL | Status: DC
  Administered 2013-08-25: 5 mg via ORAL
  Filled 2013-08-23: qty 1

## 2013-08-23 MED ORDER — SODIUM CHLORIDE 0.9 % IJ SOLN
3.0000 mL | Freq: Two times a day (BID) | INTRAMUSCULAR | Status: DC
  Administered 2013-08-24 – 2013-08-25 (×4): 3 mL via INTRAVENOUS

## 2013-08-23 MED ORDER — ASPIRIN EC 81 MG PO TBEC
81.0000 mg | DELAYED_RELEASE_TABLET | Freq: Every day | ORAL | Status: DC
  Administered 2013-08-24 – 2013-08-25 (×3): 81 mg via ORAL
  Filled 2013-08-23 (×3): qty 1

## 2013-08-23 MED ORDER — HEPARIN SODIUM (PORCINE) 1000 UNIT/ML DIALYSIS
2000.0000 [IU] | Freq: Once | INTRAMUSCULAR | Status: AC
  Administered 2013-08-24: 2000 [IU] via INTRAVENOUS_CENTRAL
  Filled 2013-08-23: qty 2

## 2013-08-23 MED ORDER — ALTEPLASE 2 MG IJ SOLR
2.0000 mg | Freq: Once | INTRAMUSCULAR | Status: AC | PRN
  Filled 2013-08-23: qty 2

## 2013-08-23 MED ORDER — TACROLIMUS 1 MG PO CAPS
1.0000 mg | ORAL_CAPSULE | Freq: Every day | ORAL | Status: DC
  Administered 2013-08-24 – 2013-08-25 (×3): 1 mg via ORAL
  Filled 2013-08-23 (×3): qty 1

## 2013-08-23 MED ORDER — ESCITALOPRAM OXALATE 20 MG PO TABS
20.0000 mg | ORAL_TABLET | Freq: Every day | ORAL | Status: DC
  Administered 2013-08-24 – 2013-08-25 (×2): 20 mg via ORAL
  Filled 2013-08-23 (×2): qty 1

## 2013-08-23 MED ORDER — AMLODIPINE BESYLATE 10 MG PO TABS
10.0000 mg | ORAL_TABLET | Freq: Every day | ORAL | Status: DC
  Administered 2013-08-24 – 2013-08-25 (×3): 10 mg via ORAL
  Filled 2013-08-23 (×3): qty 1

## 2013-08-23 MED ORDER — SODIUM CHLORIDE 0.9 % IV SOLN: 100.0000 mL | INTRAVENOUS | Status: DC | PRN

## 2013-08-23 MED ORDER — INSULIN GLARGINE 100 UNIT/ML ~~LOC~~ SOLN
50.0000 [IU] | Freq: Every day | SUBCUTANEOUS | Status: DC
  Administered 2013-08-24 – 2013-08-25 (×3): 50 [IU] via SUBCUTANEOUS
  Filled 2013-08-23 (×3): qty 0.5

## 2013-08-23 MED ORDER — ALUM & MAG HYDROXIDE-SIMETH 200-200-20 MG/5ML PO SUSP: 30.0000 mL | Freq: Four times a day (QID) | ORAL | Status: DC | PRN

## 2013-08-23 MED ORDER — ALBUTEROL SULFATE (5 MG/ML) 0.5% IN NEBU
10.0000 mg | INHALATION_SOLUTION | Freq: Once | RESPIRATORY_TRACT | Status: AC
  Administered 2013-08-23: 10 mg via RESPIRATORY_TRACT
  Filled 2013-08-23: qty 2

## 2013-08-23 MED ORDER — PREDNISONE 2.5 MG PO TABS
2.5000 mg | ORAL_TABLET | ORAL | Status: DC
  Administered 2013-08-24: 2.5 mg via ORAL
  Filled 2013-08-23 (×2): qty 1

## 2013-08-23 MED ORDER — HEPARIN SODIUM (PORCINE) 1000 UNIT/ML DIALYSIS
1000.0000 [IU] | INTRAMUSCULAR | Status: DC | PRN
  Filled 2013-08-23: qty 1

## 2013-08-23 MED ORDER — LIDOCAINE-PRILOCAINE 2.5-2.5 % EX CREA: 1.0000 "application " | TOPICAL_CREAM | CUTANEOUS | Status: DC | PRN

## 2013-08-23 MED ORDER — ONDANSETRON HCL 4 MG PO TABS: 4.0000 mg | ORAL_TABLET | Freq: Four times a day (QID) | ORAL | Status: DC | PRN

## 2013-08-23 MED ORDER — LABETALOL HCL 300 MG PO TABS
300.0000 mg | ORAL_TABLET | Freq: Two times a day (BID) | ORAL | Status: DC
  Administered 2013-08-24 – 2013-08-25 (×5): 300 mg via ORAL
  Filled 2013-08-23 (×6): qty 1

## 2013-08-23 NOTE — ED Notes (Signed)
Resp. Paged for breathing treatment.

## 2013-08-23 NOTE — ED Notes (Signed)
Report called to dialysis and given to [place], Charity fundraiser. Nurse has no further questions upon report given.

## 2013-08-23 NOTE — ED Notes (Signed)
Lab called and reported BMP is on the machine right now.

## 2013-08-23 NOTE — ED Notes (Addendum)
Cardiac montior alarms sounding.  Pt heart rate 140s-150s.  Pt sitting up in bed leaning over bedside table.  Pt alert and talking with RN at bedside.  Denies any pain.    MD [name] made aware and EKG ordered and performed.

## 2013-08-23 NOTE — ED Provider Notes (Signed)
CSN: [id number]     Arrival date & time 08/23/13  1300 History   First MD Initiated Contact with Patient 08/23/13 1305     Chief Complaint  Patient presents with  . Shortness of Breath    HPI Pt was seen at 1315. Per pt, c/o gradual onset and worsening of persistent SOB since yesterday. Pt has hx of ESRD on HD, Tuesday, Thursday, Saturday. Pt states she completed her usual and full HD treatment 2 days ago. EMS gave IV calcium en route to the ED. Pt denies CP/palpitations, no cough, no abd pain, no N/V/D, no back pain, no fevers.    Past Medical History  Diagnosis Date  . History of renal transplant 2010  . Diabetic retinopathy   . HTN (hypertension)     not well controlled  . 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 HLD (hyperlipidemia)   . ESRD on dialysis 10/20/2008    Hx of pancreas-kidney transplant in 2010, started back on dialysis in Oct 2013. Back on insulin also due to failure of pancreatic graft.    [name] ESRD on dialysis     "[facility]; TTS; (01/27/2013)  . History of blood transfusion     "lots of times" (01/27/2013)  . CAP (community acquired pneumonia) 2010  . Shortness of breath     "w/dialysis" (01/27/2013)  . Type II diabetes mellitus   . Anemia   . Ovarian tumor, malignant 2005    resected in [place] pt denies that this was cancer on 01/27/2013   Past Surgical History  Procedure Laterality Date  . Tumor removal  2005    Ovarian, resected in [place]  . Av fistula placement  06/03/2012    Procedure: INSERTION OF ARTERIOVENOUS (AV) GORE-TEX GRAFT ARM;  Surgeon: Larina Earthly, MD;  Location: [facility] OR;  Service: Vascular;  Laterality: Left;  [name] Eye surgery Right     "cause of diabetes" (4/23/20140  . Combined kidney-pancreas transplant  2010    /notes 01/27/2013; @ [facility]  . 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 Renal  biopsy  12/13/2010; 10/25/2011    [name] 01/27/2013   Family History  Problem Relation Age of Onset  . Diabetes Mother   . Liver cancer Father   . Cancer Father    History  Substance Use Topics  . Smoking status: Never Smoker   . Smokeless tobacco: Never Used  . Alcohol Use: No    Review of Systems ROS: Statement: All systems negative except as marked or noted in the HPI; Constitutional: Negative for fever and chills. ; ; Eyes: Negative for eye pain, redness and discharge. ; ; ENMT: Negative for ear pain, hoarseness, nasal congestion, sinus pressure and sore throat. ; ; Cardiovascular: Negative for chest pain, palpitations, diaphoresis, and peripheral edema. ; ; Respiratory: +SOB. Negative for cough, wheezing and stridor. ; ; Gastrointestinal: Negative for nausea, vomiting, diarrhea, abdominal pain, blood in stool, hematemesis, jaundice and rectal bleeding. ; ; Genitourinary: Negative for dysuria, flank pain and hematuria. ; ; Musculoskeletal: Negative for back pain and neck pain. Negative for swelling and trauma.; ; Skin: Negative for pruritus, rash, abrasions, blisters, bruising and skin lesion.; ; Neuro: Negative for headache, lightheadedness and neck stiffness. Negative for weakness, altered level of consciousness , altered mental status, extremity weakness, paresthesias, involuntary movement, seizure and syncope.  Allergies  Ace inhibitors  Home Medications   Current Outpatient Rx  Name  Route  Sig  Dispense  Refill  . amLODipine (NORVASC) 10 MG tablet   Oral   Take 10 mg by mouth at bedtime.          [name] aspirin EC 81 MG tablet   Oral   Take 81 mg by mouth at bedtime.          . calcium acetate (PHOSLO) 667 MG capsule   Oral   Take 3 capsules (2,001 mg total) by mouth 3 (three) times daily with meals.         [name] escitalopram (LEXAPRO) 20 MG tablet   Oral   Take 20 mg by mouth daily.         . ferrous sulfate 325 (65 FE) MG tablet   Oral   Take 325 mg by mouth 3  (three) times daily with meals.         . insulin aspart (NOVOLOG) 100 UNIT/ML injection   Subcutaneous   Inject 12 Units into the skin 3 (three) times daily with meals.         . insulin glargine (LANTUS) 100 UNIT/ML injection   Subcutaneous   Inject 63 Units into the skin at bedtime.          [name] labetalol (NORMODYNE) 300 MG tablet   Oral   Take 300 mg by mouth 2 (two) times daily.         . metoCLOPramide (REGLAN) 5 MG tablet   Oral   Take 5 mg by mouth 4 (four) times daily.         . multivitamin (RENA-VIT) TABS tablet   Oral   Take 1 tablet by mouth daily.         . predniSONE (DELTASONE) 5 MG tablet   Oral   Take 2.5-5 mg by mouth at bedtime. Alternate daily doses of 2.5mg  and 5mg          . simvastatin (ZOCOR) 20 MG tablet   Oral   Take 20 mg by mouth every Monday, Wednesday, and Friday.         . tacrolimus (PROGRAF) 1 MG capsule   Oral   Take 1 mg by mouth at bedtime.           BP 173/66  Pulse 90  Temp(Src) 97.6 F (36.4 C) (Oral)  Resp 27  SpO2 100% Filed Vitals:   08/23/13 1454 08/23/13 1500 08/23/13 1515 08/23/13 1530  BP:  192/75 186/60 173/66  Pulse: 90 88 86 90  Temp:      TempSrc:      Resp: 24 24 26 27   SpO2: 100% 98% 99% 100%    Physical Exam 1320: Physical examination:  Nursing notes reviewed; Vital signs and O2 SAT reviewed;  Constitutional: Well developed, Well nourished, In no acute distress; Head:  Normocephalic, atraumatic; Eyes: EOMI, PERRL, No scleral icterus; ENMT: Mouth and pharynx normal, Mucous membranes dry; Neck: Supple, Full range of motion, No lymphadenopathy; Cardiovascular: Regular rate and rhythm, No gallop; Respiratory: Breath sounds coarse & equal bilaterally, No wheezes.  Speaking full sentences, Normal respiratory effort/excursion; Chest: Nontender, Movement normal; Abdomen: Soft, Nontender, Nondistended, Normal bowel sounds; Genitourinary: No CVA tenderness; Extremities: Pulses normal, No tenderness, No  edema, No calf edema or asymmetry.; Neuro: AA&Ox3, Major CN grossly intact.  Speech clear. No gross focal motor deficits in extremities.; Skin: Color normal, Warm, Dry.   ED Course  Procedures  EKG Interpretation    Date/Time:  Monday August 23 2013 14:10:08 EST Ventricular Rate:  93 PR Interval:  117 QRS Duration: 153 QT Interval:  432 QTC Calculation: 537 R Axis:   160 Text Interpretation:  Sinus rhythm Borderline short PR interval Consider left ventricular hypertrophy Probable lateral infarct, age indeterminate Nonspecific ST and T wave abnormality Lateral leads Prolonged QT interval            MDM  MDM Reviewed: previous chart, nursing note and vitals Reviewed previous: labs and ECG Interpretation: labs, ECG and x-ray Total time providing critical care: 30-74 minutes. This excludes time spent performing separately reportable procedures and services. Consults: Renal.   CRITICAL CARE Performed by: Laray Anger Total critical care time: 35 Critical care time was exclusive of separately billable procedures and treating other patients. Critical care was necessary to treat or prevent imminent or life-threatening deterioration. Critical care was time spent personally by me on the following activities: development of treatment plan with patient and/or surrogate as well as nursing, discussions with consultants, evaluation of patient's response to treatment, examination of patient, obtaining history from patient or surrogate, ordering and performing treatments and interventions, ordering and review of laboratory studies, ordering and review of radiographic studies, pulse oximetry and re-evaluation of patient's condition.   Results for orders placed during the hospital encounter of 08/23/13  CBC WITH DIFFERENTIAL      Result Value Range   WBC 13.2 (*) 4.0 - 10.5 K/uL   RBC 3.38 (*) 3.87 - 5.11 MIL/uL   Hemoglobin 10.9 (*) 12.0 - 15.0 g/dL   HCT 84.1 (*) 32.4 - 40.1 %    MCV 93.2  78.0 - 100.0 fL   MCH 32.2  26.0 - 34.0 pg   MCHC 34.6  30.0 - 36.0 g/dL   RDW 02.7 (*) 25.3 - 66.4 %   Platelets 278  150 - 400 K/uL   Neutrophils Relative % 80 (*) 43 - 77 %   Neutro Abs 10.6 (*) 1.7 - 7.7 K/uL   Lymphocytes Relative 13  12 - 46 %   Lymphs Abs 1.8  0.7 - 4.0 K/uL   Monocytes Relative 4  3 - 12 %   Monocytes Absolute 0.5  0.1 - 1.0 K/uL   Eosinophils Relative 3  0 - 5 %   Eosinophils Absolute 0.4  0.0 - 0.7 K/uL   Basophils Relative 1  0 - 1 %   Basophils Absolute 0.1  0.0 - 0.1 K/uL  BASIC METABOLIC PANEL      Result Value Range   Sodium 125 (*) 135 - 145 mEq/L   Potassium >7.5 (*) 3.5 - 5.1 mEq/L   Chloride 84 (*) 96 - 112 mEq/L   CO2 18 (*) 19 - 32 mEq/L   Glucose, Bld 131 (*) 70 - 99 mg/dL   BUN 76 (*) 6 - 23 mg/dL   Creatinine, Ser 4.03 (*) 0.50 - 1.10 mg/dL   Calcium 47.4 (*) 8.4 - 10.5 mg/dL   GFR calc non Af Amer 5 (*) >90 mL/min   GFR calc Af Amer 5 (*) >90 mL/min  TROPONIN I      Result Value Range   Troponin I <0.30  <0.30 ng/mL   Dg Chest Portable 1 View 08/23/2013   CLINICAL DATA:  Shortness of breath.  EXAM: PORTABLE CHEST - 1 VIEW  COMPARISON:  04/26/2013.  FINDINGS: The heart is enlarged. There is a fulminant pattern of pulmonary edema. No definite pleural effusions. The bony  thorax is intact.  IMPRESSION: Cardiac enlargement and pulmonary edema.   Electronically Signed   By: Loralie Champagne M.D.   On: 08/23/2013 14:11     1445:  BP stable, HR 80-90's. Sats 100% on O2 4L N/C. EKG with peaked T-waves, similar to previous on EKG dated 04/26/2013. IV calcium already given by EMS PTA. Will dose IV insulin, D50, albuterol neb for hyperkalemia. Pt needs emergent HD. Pt insists she has been complaint with her usual HD treatments, LD Saturday. T/C to Renal Dr. Arlean Hopping, case discussed, including:  HPI, pertinent PM/SHx, VS/PE, dx testing, ED course and treatment:  Agreeable to come to ED for eval for emergent HD treatment.    DESMA WILKOWSKI, DO 08/25/13 2011

## 2013-08-23 NOTE — H&P (Signed)
Renal Service H & P  Note [facility]  [name] 08/23/2013 Kathryne Ramella D Requesting Physician:  ER MD at [facility]  Presenting Complaint:  Hyperkalemia and pulm edema in esrd pt HPI: The patient is a 37 y.o. 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  In ED K was >7.5, EKG showed narrow qrs then widened then narrow again after acute Rx with albuterol, ins, glucose and calcium IV. Pt did not miss last HD on Sat, has hx of high fluid wt gains.    ROS  denies cp, sob , cough  no hemoptysis  no trouble swallowing  no confusion  no abd pain  Past Medical History  Past Medical History  Diagnosis Date  . History of renal transplant 2010  . Diabetic retinopathy   . HTN (hypertension)     not well controlled  . 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 HLD (hyperlipidemia)   . ESRD on dialysis 10/20/2008    Hx of pancreas-kidney transplant in 2010, started back on dialysis in Oct 2013. Back on insulin also due to failure of pancreatic graft.    [name] ESRD on dialysis     "[facility]; TTS; (01/27/2013)  . History of blood transfusion     "lots of times" (01/27/2013)  . CAP (community acquired pneumonia) 2010  . Shortness of breath     "w/dialysis" (01/27/2013)  . Type II diabetes mellitus   . Anemia   . Ovarian tumor, malignant 2005    resected in [place]' pt denies that this was cancer on 01/27/2013   Past Surgical History  Past Surgical History  Procedure Laterality Date  . Tumor removal  2005    Ovarian, resected in [place]  . Av fistula placement  06/03/2012    Procedure: INSERTION OF ARTERIOVENOUS (AV) GORE-TEX GRAFT ARM;  Surgeon: Larina Earthly, MD;  Location: [facility] OR;  Service: Vascular;  Laterality: Left;  [name] Eye surgery Right     "cause of diabetes"  (4/23/20140  . Combined kidney-pancreas transplant  2010    /notes 01/27/2013; @ [facility]  . 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 Renal biopsy  12/13/2010; 10/25/2011    [name] 01/27/2013   Family History  Family History  Problem Relation Age of Onset  . Diabetes Mother   . Liver cancer Father   . Cancer Father    Social History  reports that she has never smoked. She has never used smokeless tobacco. She reports that she does not drink alcohol or use illicit drugs. Allergies  Allergies  Allergen Reactions  . Ace Inhibitors Other (See Comments) and Cough     cause cough Per medical records  cause cough   Home medications Prior to Admission medications   Medication Sig Start Date End Date Taking? Authorizing Provider  amLODipine (NORVASC) 10 MG tablet Take 10 mg by mouth at bedtime.    Yes Historical Provider, MD  aspirin EC 81 MG tablet Take 81 mg by mouth at bedtime.    Yes Historical Provider, MD  calcium acetate (PHOSLO) 667 MG capsule Take 3 capsules (2,001 mg total) by mouth 3 (three) times daily with meals. 01/28/13  Yes Sheffield Slider, PA-C  escitalopram (LEXAPRO) 20 MG tablet Take 20 mg by mouth daily.   Yes Historical Provider, MD  ferrous sulfate 325 (65 FE) MG tablet Take 325 mg by mouth 3 (three) times daily with meals.   Yes Historical Provider, MD  insulin aspart (NOVOLOG) 100 UNIT/ML injection Inject 12 Units into the skin 3 (three) times daily with meals.   Yes Historical Provider, MD  insulin glargine (LANTUS) 100 UNIT/ML injection Inject 63 Units into the skin at bedtime.    Yes Historical Provider, MD  labetalol (NORMODYNE) 300 MG tablet Take 300 mg by mouth 2 (two) times daily.   Yes Historical Provider, MD  metoCLOPramide (REGLAN) 5 MG tablet Take 5 mg by mouth 4 (four) times daily.   Yes Historical Provider, MD  multivitamin (RENA-VIT) TABS tablet Take 1 tablet by mouth daily.   Yes Historical  Provider, MD  predniSONE (DELTASONE) 5 MG tablet Take 2.5-5 mg by mouth at bedtime. Alternate daily doses of 2.5mg  and 5mg    Yes Historical Provider, MD  simvastatin (ZOCOR) 20 MG tablet Take 20 mg by mouth every Monday, Wednesday, and Friday.   Yes Historical Provider, MD  tacrolimus (PROGRAF) 1 MG capsule Take 1 mg by mouth at bedtime.    Yes Historical Provider, MD   Liver Function Tests No results found for this basename: AST, ALT, ALKPHOS, BILITOT, PROT, ALBUMIN,  in the last 168 hours No results found for this basename: LIPASE, AMYLASE,  in the last 168 hours CBC  Recent Labs Lab 08/23/13 1319  WBC 13.2*  NEUTROABS 10.6*  HGB 10.9*  HCT 31.5*  MCV 93.2  PLT 278   Basic Metabolic Panel  Recent Labs Lab 08/23/13 1319  NA 125*  K >7.5*  CL 84*  CO2 18*  GLUCOSE 131*  BUN 76*  CREATININE 9.64*  CALCIUM 11.6*    Exam  Blood pressure 174/96, pulse 90, temperature 97.6 F (36.4 C), temperature source Oral, resp. rate 24, SpO2 100.00%.  gen: responsive, facial edema 2+  skin: no rash, cyanosis  heent: eomi, sclera anicteric, throat clear  neck: ++ jvd, no lan  chest: clear bilat   cor: regular, no M or rub, pedal pulses intact  abd: soft, nt, nd, no ascites  ext: 2+ diffuse leg edema bilat, no joint effusion, no gangrene/ulcers  neuro: groggy but arouses easily, nf   cxr- bilat pulm edema, enlarged heart, similar to prior films  Dialysis orders: TTS at [facility] 3.5hrs (4hrs if wt gain >4kg)   71kg   f180   2K/2.25Ca   Heparin 2400 Hectorol 2ug     Epo 20K     Venofer none   Levocarnitine 1 gm (for anemia) Labs: tsat -    Phos-    pth-   Assessment/Plan: 1. Hyperkalemia- urgent HD today, see orders 2. Vol excess / pulm edema- marked vol excess, up 6 kg by wts, facial edema, pulm edema, max UF today w hd 3. ESRD- HD today then again tomorrow 4. Anemia of CKD- Hb 10.9, on epo at center high dose, start darbe 100 on thurs 5. MBD (metabolic bone disease)- cont  phoslo, vit D 6. Hx kidney-panc transplant- on lowdose prograf and pred likely for residual panc fxn 7. DM2- lantus + SSI 8. HTN- on labetalol and norvasc    Vinson Moselle MD  pager [phone]    cell [phone]  08/23/2013, 3:01 PM

## 2013-08-23 NOTE — ED Notes (Signed)
Per EMS: pt dialysis Saturday; pt c/o shortness of breath. Pt has peaked t waves. Pt given 1g calcium en route. Pt on 4 L at home oxygen. Pt denies pain. Pt HTN 200/110. Pt states this started last night before bed. Pt states progressively worse overnight. CBG 145. 20 R AC.

## 2013-08-23 NOTE — Procedures (Signed)
I was present at this dialysis session, have reviewed the session itself and made  appropriate changes   Rob Takirah Binford MD  pager [phone]    cell [phone]  08/23/2013, 5:06 PM   

## 2013-08-23 NOTE — Consult Note (Deleted)
Renal Service Consult Note [facility]  [name] 08/23/2013 [name] Requesting Physician:  ER MD at [facility]  Reason for Consult:  Hyperkalemia and pulm edema in esrd pt HPI: The patient is a 37 y.o. 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  In ED K was >7.5, EKG showed narrow qrs then widened then narrow again after acute Rx with albuterol, ins, glucose and calcium IV. Pt did not miss last HD on Sat, has hx of high fluid wt gains.    ROS  denies cp, sob , cough  no hemoptysis  no trouble swallowing  no confusion  no abd pain  Past Medical History  Past Medical History  Diagnosis Date  . History of renal transplant 2010  . Diabetic retinopathy   . HTN (hypertension)     not well controlled  . 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 HLD (hyperlipidemia)   . ESRD on dialysis 10/20/2008    Hx of pancreas-kidney transplant in 2010, started back on dialysis in Oct 2013. Back on insulin also due to failure of pancreatic graft.    [name] ESRD on dialysis     "[name]; TTS; (01/27/2013)  . History of blood transfusion     "lots of times" (01/27/2013)  . CAP (community acquired pneumonia) 2010  . Shortness of breath     "w/dialysis" (01/27/2013)  . Type II diabetes mellitus   . Anemia   . Ovarian tumor, malignant 2005    resected in [place] pt denies that this was cancer on 01/27/2013   Past Surgical History  Past Surgical History  Procedure Laterality Date  . Tumor removal  2005    Ovarian, resected in [place]  . Av fistula placement  06/03/2012    Procedure: INSERTION OF ARTERIOVENOUS (AV) GORE-TEX GRAFT ARM;  Surgeon: Alicia Earthly, MD;  Location: [facility] OR;  Service: Vascular;  Laterality: Left;  [name] Eye surgery Right     "cause of diabetes"  (4/23/20140  . Combined kidney-pancreas transplant  2010    /notes 01/27/2013; @ [facility]  . 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 Renal biopsy  12/13/2010; 10/25/2011    [name] 01/27/2013   Family History  Family History  Problem Relation Age of Onset  . Diabetes Mother   . Liver cancer Father   . Cancer Father    Social History  reports that she has never smoked. She has never used smokeless tobacco. She reports that she does not drink alcohol or use illicit drugs. Allergies  Allergies  Allergen Reactions  . Ace Inhibitors Other (See Comments) and Cough     cause cough Per medical records  cause cough   Home medications Prior to Admission medications   Medication Sig Start Date End Date Taking? Authorizing Provider  amLODipine (NORVASC) 10 MG tablet Take 10 mg by mouth at bedtime.    Yes Historical Provider, MD  aspirin EC 81 MG tablet Take 81 mg by mouth at bedtime.    Yes Historical Provider, MD  calcium acetate (PHOSLO) 667 MG capsule Take 3 capsules (2,001 mg total) by mouth 3 (three) times daily with meals. 01/28/13  Yes Alicia Slider, PA-C  escitalopram (  LEXAPRO) 20 MG tablet Take 20 mg by mouth daily.   Yes Historical Provider, MD  ferrous sulfate 325 (65 FE) MG tablet Take 325 mg by mouth 3 (three) times daily with meals.   Yes Historical Provider, MD  insulin aspart (NOVOLOG) 100 UNIT/ML injection Inject 12 Units into the skin 3 (three) times daily with meals.   Yes Historical Provider, MD  insulin glargine (LANTUS) 100 UNIT/ML injection Inject 63 Units into the skin at bedtime.    Yes Historical Provider, MD  labetalol (NORMODYNE) 300 MG tablet Take 300 mg by mouth 2 (two) times daily.   Yes Historical Provider, MD  metoCLOPramide (REGLAN) 5 MG tablet Take 5 mg by mouth 4 (four) times daily.   Yes Historical Provider, MD  multivitamin (RENA-VIT) TABS tablet Take 1 tablet by mouth daily.   Yes Historical  Provider, MD  predniSONE (DELTASONE) 5 MG tablet Take 2.5-5 mg by mouth at bedtime. Alternate daily doses of 2.5mg  and 5mg    Yes Historical Provider, MD  simvastatin (ZOCOR) 20 MG tablet Take 20 mg by mouth every Monday, Wednesday, and Friday.   Yes Historical Provider, MD  tacrolimus (PROGRAF) 1 MG capsule Take 1 mg by mouth at bedtime.    Yes Historical Provider, MD   Liver Function Tests No results found for this basename: AST, ALT, ALKPHOS, BILITOT, PROT, ALBUMIN,  in the last 168 hours No results found for this basename: LIPASE, AMYLASE,  in the last 168 hours CBC  Recent Labs Lab 08/23/13 1319  WBC 13.2*  NEUTROABS 10.6*  HGB 10.9*  HCT 31.5*  MCV 93.2  PLT 278   Basic Metabolic Panel  Recent Labs Lab 08/23/13 1319  NA 125*  K >7.5*  CL 84*  CO2 18*  GLUCOSE 131*  BUN 76*  CREATININE 9.64*  CALCIUM 11.6*    Exam  Blood pressure 174/96, pulse 90, temperature 97.6 F (36.4 C), temperature source Oral, resp. rate 24, SpO2 100.00%.  gen: responsive, facial edema 2+  skin: no rash, cyanosis  heent: eomi, sclera anicteric, throat clear  neck: ++ jvd, no lan  chest: clear bilat   cor: regular, no M or rub, pedal pulses intact  abd: soft, nt, nd, no ascites  ext: 2+ diffuse leg edema bilat, no joint effusion, no gangrene/ulcers  neuro: groggy but arouses easily, nf   cxr- bilat pulm edema, enlarged heart, similar to prior films  Dialysis orders: TTS at [facility] 3.5hrs (4hrs if wt gain >4kg)   71kg   f180   2K/2.25Ca   Heparin 2400 Hectorol 2ug     Epo 20K     Venofer none   Levocarnitine 1 gm (for anemia) Labs: tsat -    Phos-    pth-   Assessment/Plan: 1. Hyperkalemia- urgent HD today, see orders 2. Vol excess / pulm edema- marked vol excess, up 6 kg by wts, facial edema, pulm edema, max UF today w hd 3. ESRD- HD today then again tomorrow 4. Anemia of CKD- Hb 10.9, on epo at center high dose, start darbe 100 on thurs 5. MBD (metabolic bone disease)- cont  phoslo, vit [name] 6. Hx kidney-panc transplant- on lowdose prograf and pred likely for residual panc fxn 7. DM2- lantus + SSI 8. HTN- on labetalol and norvasc    Vinson Moselle MD  pager [phone]    cell [phone]  08/23/2013, 3:01 PM

## 2013-08-24 LAB — CBC
HCT: 29.1 % — ABNORMAL LOW (ref 36.0–46.0)
MCH: 31.7 pg (ref 26.0–34.0)
MCV: 93.3 fL (ref 78.0–100.0)
Platelets: 310 10*3/uL (ref 150–400)
RDW: 16 % — ABNORMAL HIGH (ref 11.5–15.5)

## 2013-08-24 LAB — BASIC METABOLIC PANEL
BUN: 34 mg/dL — ABNORMAL HIGH (ref 6–23)
CO2: 25 mEq/L (ref 19–32)
Creatinine, Ser: 6.24 mg/dL — ABNORMAL HIGH (ref 0.50–1.10)
GFR calc non Af Amer: 8 mL/min — ABNORMAL LOW (ref >90)
Glucose, Bld: 132 mg/dL — ABNORMAL HIGH (ref 70–99)
Potassium: 5.9 mEq/L — ABNORMAL HIGH (ref 3.5–5.1)
Sodium: 129 mEq/L — ABNORMAL LOW (ref 135–145)

## 2013-08-24 LAB — GLUCOSE, CAPILLARY
Glucose-Capillary: 118 mg/dL — ABNORMAL HIGH (ref 70–99)
Glucose-Capillary: 128 mg/dL — ABNORMAL HIGH (ref 70–99)

## 2013-08-24 LAB — MRSA PCR SCREENING: MRSA by PCR: NEGATIVE

## 2013-08-24 MED ORDER — HYDROXYZINE HCL 25 MG PO TABS
25.0000 mg | ORAL_TABLET | Freq: Four times a day (QID) | ORAL | Status: DC | PRN
  Administered 2013-08-25: 25 mg via ORAL
  Filled 2013-08-24: qty 1

## 2013-08-24 NOTE — Progress Notes (Signed)
[facility] Progress Note  Subjective:   No emerging complaints.  Objective Filed Vitals:   08/23/13 2000 08/23/13 2013 08/23/13 2109 08/24/13 0432  BP: 140/82 102/56 148/65 150/59  Pulse: 115 117 102 90  Temp:  98 F (36.7 C) 98.1 F (36.7 C) 98.2 F (36.8 C)  TempSrc:  Oral Oral Oral  Resp:  15 17 18   Weight:  73.3 kg (161 lb 9.6 oz) 73.993 kg (163 lb 2 oz)   SpO2:  94% 100% 100%   Physical Exam General: Sleepy but cooperative, still with some facial edema, NAD Heart: Tachy regular, no murmur or rub Lungs: Scattered rhonchi, no wheezes appreciate Abdomen: Soft, NT non distended, + BS Extremities: LE edema resolved Dialysis Access: L AVF + bruit  Dialysis orders: TTS at [facility]  3.5hrs (4hrs if wt gain >4kg) 71kg f180 2K/2.25Ca Heparin 2400  Hectorol 2ug Epo 20K Venofer none Levocarnitine 1 gm (for anemia)  Labs: tsat 26%, Ferritin > 1200, Phos 11.6  pth 276  Assessment/Plan: 1. Hyperkalemia - Improved with HD yesterday but K+ still elevated at  5.9 this a.m. HD again today 2. Volume excess - Pulm edema resolving on repeat CXR. Still up ~3kg with rhonchi on exam 3. ESRD - TTS, Emergent HD yesterday and again today. Anticipate return to reg schedule on Thurs. 4. Anemia - Hgb 10.9. Aranesp 100 q Thurs HD. No Fe for now. Last Tsat 26% with ferritin > 1200. 5. Secondary hyperparathyroidism - Ca 9.6. PTH within range. ^Phos (poor compliance op). Continue Phoslo and Vit D 6. HTN - continue labetalol and norvasc 7. Nutrition - Renal diet, multivitamin 8. DM2 - insulin per primary 9. Hx kidney/pancreas transplant - on low dose prograf and prednisone, likely for residual pancreas fxn  Scot Jun. Thad Ranger [facility] Pager [phone] 08/24/2013,9:08 AM  LOS: 1 day   I have seen and examined patient, discussed with PA and agree with assessment and plan as outlined above.  Will reevaluate in am (cxr, K+) after additional HD today. Discussed  with patient the risks she is taking with her life regarding nonadherence to dietary recommendations and she said she understands. She did not have any questions about dietary recommendations.   Vinson Moselle MD pager [phone]    cell [phone] 08/24/2013, 11:26 AM    Additional Objective Labs: Basic Metabolic Panel:  Recent Labs Lab 08/23/13 1319 08/24/13 0706  NA 125* 129*  K >7.5* 5.9*  CL 84* 88*  CO2 18* 25  GLUCOSE 131* 132*  BUN 76* 34*  CREATININE 9.64* 6.24*  CALCIUM 11.6* 9.6  PHOS  --  8.7*   CBC:  Recent Labs Lab 08/23/13 1319  WBC 13.2*  NEUTROABS 10.6*  HGB 10.9*  HCT 31.5*  MCV 93.2  PLT 278   Blood Culture    Component Value Date/Time   SDES BLOOD HAND RIGHT 04/26/2013 1425   SPECREQUEST BOTTLES DRAWN AEROBIC ONLY 2CC 04/26/2013 1425   CULT NO GROWTH 5 DAYS 04/26/2013 1425   REPTSTATUS 05/03/2013 FINAL 04/26/2013 1425    Cardiac Enzymes:  Recent Labs Lab 08/23/13 1319  TROPONINI <0.30   CBG:  Recent Labs Lab 08/23/13 2226  GLUCAP 202*   Studies/Results: X-ray Chest Pa And Lateral   08/24/2013   CLINICAL DATA:  Shortness of breath.  Pulmonary edema.  EXAM: CHEST  2 VIEW  COMPARISON:  08/23/2013.  FINDINGS: Interim significant clearing of pulmonary edema noted bilaterally. Minimal blunting of both costophrenic angles, no significant pleural effusion.  No pneumothorax. Persistent cardiomegaly. No pulmonary venous congestion. No focal osseous lesion. Surgical clip and calcific density in the left upper arm soft tissues, these these are stable.  IMPRESSION: Interim near complete clearing of pulmonary edema.   Electronically Signed   By: Maisie Fus  Register   On: 08/24/2013 06:50   Dg Chest Portable 1 View  08/23/2013   CLINICAL DATA:  Shortness of breath.  EXAM: PORTABLE CHEST - 1 VIEW  COMPARISON:  04/26/2013.  FINDINGS: The heart is enlarged. There is a fulminant pattern of pulmonary edema. No definite pleural effusions. The bony thorax is  intact.  IMPRESSION: Cardiac enlargement and pulmonary edema.   Electronically Signed   By: Loralie Champagne M.D.   On: 08/23/2013 14:11   Medications:   . amLODipine  10 mg Oral QHS  . aspirin EC  81 mg Oral QHS  . calcium acetate  2,001 mg Oral TID WC  . [START ON 08/26/2013] darbepoetin  100 mcg Intravenous Q7 days  . escitalopram  20 mg Oral Daily  . heparin  2,000 Units Dialysis Once in dialysis  . insulin aspart  0-15 Units Subcutaneous TID WC  . insulin aspart  0-5 Units Subcutaneous QHS  . insulin glargine  50 Units Subcutaneous QHS  . labetalol  300 mg Oral BID  . metoCLOPramide  5 mg Oral QID  . multivitamin  1 tablet Oral Daily  . predniSONE  2.5 mg Oral Q48H  . [START ON 08/25/2013] predniSONE  5 mg Oral Q48H  . [START ON 08/25/2013] simvastatin  20 mg Oral Q M,W,F  . sodium chloride  3 mL Intravenous Q12H  . tacrolimus  1 mg Oral QHS

## 2013-08-24 NOTE — Procedures (Signed)
I was present at this dialysis session, have reviewed the session itself and made  appropriate changes   Rob Luie Laneve MD  pager [phone]    cell [phone]  08/24/2013, 11:48 AM   

## 2013-08-24 NOTE — Progress Notes (Signed)
   CARE MANAGEMENT NOTE 08/24/2013  Patient:  [name], [name]   Account Number:  [id number]  Date Initiated:  08/24/2013  Documentation initiated by:  Darlyne Russian  Subjective/Objective Assessment:   admitted with SOB,pulmonary edema, hyperkalemia 7.5,  Medical ZO:XWRU/EA,  renal transplant 2010     Action/Plan:   progression of care and discharge planning   Anticipated DC Date:     Anticipated DC Plan:  HOME/SELF CARE         Choice offered to / List presented to:             Status of service:  In process, will continue to follow Medicare Important Message given?   (If response is "NO", the following Medicare IM given date fields will be blank) Date Medicare IM given:   Date Additional Medicare IM given:    Discharge Disposition:    Per UR Regulation:  Reviewed for med. necessity/level of care/duration of stay  If discussed at Long Length of Stay Meetings, dates discussed:    Comments:

## 2013-08-25 ENCOUNTER — Inpatient Hospital Stay (HOSPITAL_COMMUNITY): Payer: Medicare Other

## 2013-08-25 DIAGNOSIS — E871 Hypo-osmolality and hyponatremia: Secondary | ICD-10-CM | POA: Diagnosis present

## 2013-08-25 DIAGNOSIS — N186 End stage renal disease: Secondary | ICD-10-CM

## 2013-08-25 DIAGNOSIS — Z992 Dependence on renal dialysis: Secondary | ICD-10-CM

## 2013-08-25 HISTORY — DX: Dependence on renal dialysis: N18.6

## 2013-08-25 HISTORY — DX: End stage renal disease: Z99.2

## 2013-08-25 LAB — GLUCOSE, CAPILLARY: Glucose-Capillary: 81 mg/dL (ref 70–99)

## 2013-08-25 LAB — BASIC METABOLIC PANEL
BUN: 34 mg/dL — ABNORMAL HIGH (ref 6–23)
CO2: 18 mEq/L — ABNORMAL LOW (ref 19–32)
Calcium: 10.1 mg/dL (ref 8.4–10.5)
Creatinine, Ser: 5.38 mg/dL — ABNORMAL HIGH (ref 0.50–1.10)
GFR calc Af Amer: 11 mL/min — ABNORMAL LOW (ref >90)

## 2013-08-25 NOTE — Discharge Summary (Signed)
Physician Discharge Summary  Patient ID: [name] MRN: [id number] DOB/AGE: [birth year] 37 y.o.  Admit date: 08/23/2013 Discharge date: 08/25/2013  Discharge Diagnoses:  1. Hyperkalemia 2. Pulmonary edema 3. ESRD 4. Anemia 5. Secondary hyperparathyroidism 6. HTN 7. DM2 8. Hx kidney/pancreas transplant  Discharged Condition: good  Hospital Course: Pt presented with sob and gen weakness, K was >7.5 and cxr showed pulm edema. Pt had daily HD x 3 in hospital. Breathing improved and high K resolved. She should leave close to her dry wt.  She had high wt gains in hospital from fluid indiscretion and the concepts related to fluid restriction were reviewed in detail with patient with care to use the metric system giving correlates for cups and quarts that she could understand. She is very familiar with the metric system as is used in [place]. Hopefully this will help.    Consults: None  Significant Diagnostic Studies:  Treatments: dialysis: Hemodialysis  Discharge Exam: Blood pressure 143/55, pulse 82, temperature 97.6 F (36.4 C), temperature source Oral, resp. rate 20, height 5\' 7"  (1.702 m), weight 76.4 kg (168 lb 6.9 oz), SpO2 100.00%. General: Alert, cooperative, facial edema resolving  Heart: RRR, no murmur or rub  Lungs: Wheezes L > R, scattered rhonchi, no crackles. Breathing unlabored on 02  Abdomen: Soft, NT, non distended, nl BS  Extremities: No LE edema  Dialysis Access: L AVF + bruit   Disposition: 01-Home or Self Care     Medication List         amLODipine 10 MG tablet  Commonly known as:  NORVASC  Take 10 mg by mouth at bedtime.     aspirin EC 81 MG tablet  Take 81 mg by mouth at bedtime.     calcium acetate 667 MG capsule  Commonly known as:  PHOSLO  Take 3 capsules (2,001 mg total) by mouth 3 (three) times daily with meals.     escitalopram 20 MG tablet  Commonly known as:  LEXAPRO  Take 20 mg by mouth daily.     ferrous sulfate 325 (65 FE) MG tablet   Take 325 mg by mouth 3 (three) times daily with meals.     insulin aspart 100 UNIT/ML injection  Commonly known as:  novoLOG  Inject 12 Units into the skin 3 (three) times daily with meals.     insulin glargine 100 UNIT/ML injection  Commonly known as:  LANTUS  Inject 63 Units into the skin at bedtime.     labetalol 300 MG tablet  Commonly known as:  NORMODYNE  Take 300 mg by mouth 2 (two) times daily.     metoCLOPramide 5 MG tablet  Commonly known as:  REGLAN  Take 5 mg by mouth 4 (four) times daily.     multivitamin Tabs tablet  Take 1 tablet by mouth daily.     predniSONE 5 MG tablet  Commonly known as:  DELTASONE  Take 2.5-5 mg by mouth at bedtime. Alternate daily doses of 2.5mg  and 5mg      simvastatin 20 MG tablet  Commonly known as:  ZOCOR  Take 20 mg by mouth every Monday, Wednesday, and Friday.     tacrolimus 1 MG capsule  Commonly known as:  PROGRAF  Take 1 mg by mouth at bedtime.         SignedDelano Metz D 08/25/2013, 1:03 PM

## 2013-08-25 NOTE — Progress Notes (Signed)
[facility] Progress Note  Subjective:   No complaints. Wants to go home.  Objective Filed Vitals:   08/24/13 1929 08/24/13 2201 08/25/13 0446 08/25/13 0955  BP:  160/76 162/76 143/55  Pulse:  97 89 82  Temp:  98.6 F (37 C) 98.1 F (36.7 C) 97.6 F (36.4 C)  TempSrc:  Oral Oral Oral  Resp:  18 17 20   Height: 5\' 7"  (1.702 m)     Weight:  75.7 kg (166 lb 14.2 oz)    SpO2:  98% 100% 100%   Physical Exam General: Alert, cooperative, facial edema resolving Heart: RRR, no murmur or rub Lungs: Wheezes L > R, scattered rhonchi, no crackles. Breathing unlabored on 02 Abdomen: Soft, NT, non distended, nl BS Extremities: No LE edema Dialysis Access: L AVF + bruit  Dialysis orders: TTS at [facility]  3.5hrs (4hrs if wt gain >4kg) 71kg f180 2K/2.25Ca Heparin 2400  Hectorol 2ug Epo 20K Venofer none Levocarnitine 1 gm (for anemia)  Labs: tsat 26%, Ferritin > 1200, Phos 11.6 pth 276  Assessment/Plan: 1. Hyperkalemia - Improved with HD with serial HD. Recheck cmet pending.   2. Volume excess - Pulm edema resolving on repeat CXR. Net UF 3L yesterday. Still up by ~5kg. HD again today. 3. ESRD - TTS, resume regular schedule  4. Anemia - Hgb 9.9 < 10.9. Aranesp 100 q Thurs HD ordered. Resume prev dose of Epo at discharge. No Fe for now. Last Tsat 26% with ferritin > 1200.  5. Secondary hyperparathyroidism - Ca 9.6. PTH within range. ^Phos (poor compliance op). Continue Phoslo and Vit D. Follow labs.  6. HTN - continue labetalol and norvasc 7. Nutrition - Renal diet, multivitamin  8. DM2 - insulin per primary  9. Hx kidney/pancreas transplant - on low dose prograf and prednisone, likely for residual pancreas fxn 10. Dispo - Pending resolution of hyperkalemia and return to dry weight. Labs in progress. Needs close follow-up with dietician upon discharge.  Scot Jun. [name] John, PA-C [facility] Pager [phone] 08/25/2013,10:47 AM  LOS: 2 days   I have seen and  examined patient, discussed with PA and agree with assessment and plan as outlined above.  Went over fluid volumes and equivalents of kg vs cups as she does not understand fluid restriction.  oK for d/c after HD today, should go to usual HD again tomorrow Alicia Moselle MD pager [phone]    cell [phone] 08/25/2013, 12:57 PM    Additional Objective Labs: Basic Metabolic Panel:  Recent Labs Lab 08/23/13 1319 08/24/13 0706  NA 125* 129*  K >7.5* 5.9*  CL 84* 88*  CO2 18* 25  GLUCOSE 131* 132*  BUN 76* 34*  CREATININE 9.64* 6.24*  CALCIUM 11.6* 9.6  PHOS  --  8.7*   CBC:  Recent Labs Lab 08/23/13 1319 08/24/13 1057  WBC 13.2* 10.3  NEUTROABS 10.6*  --   HGB 10.9* 9.9*  HCT 31.5* 29.1*  MCV 93.2 93.3  PLT 278 310   Blood Culture    Component Value Date/Time   SDES BLOOD HAND RIGHT 04/26/2013 1425   SPECREQUEST BOTTLES DRAWN AEROBIC ONLY 2CC 04/26/2013 1425   CULT NO GROWTH 5 DAYS 04/26/2013 1425   REPTSTATUS 05/03/2013 FINAL 04/26/2013 1425    Cardiac Enzymes:  Recent Labs Lab 08/23/13 1319  TROPONINI <0.30   CBG:  Recent Labs Lab 08/24/13 0917 08/24/13 1503 08/24/13 1716 08/24/13 2149 08/25/13 0748  GLUCAP 174* 118* 294* 128* 96    Studies/Results: X-ray  Chest Pa And Lateral   08/24/2013   CLINICAL DATA:  Shortness of breath.  Pulmonary edema.  EXAM: CHEST  2 VIEW  COMPARISON:  08/23/2013.  FINDINGS: Interim significant clearing of pulmonary edema noted bilaterally. Minimal blunting of both costophrenic angles, no significant pleural effusion. No pneumothorax. Persistent cardiomegaly. No pulmonary venous congestion. No focal osseous lesion. Surgical clip and calcific density in the left upper arm soft tissues, these these are stable.  IMPRESSION: Interim near complete clearing of pulmonary edema.   Electronically Signed   By: Maisie Fus  Register   On: 08/24/2013 06:50   Dg Chest Portable 1 View  08/23/2013   CLINICAL DATA:  Shortness of breath.  EXAM:  PORTABLE CHEST - 1 VIEW  COMPARISON:  04/26/2013.  FINDINGS: The heart is enlarged. There is a fulminant pattern of pulmonary edema. No definite pleural effusions. The bony thorax is intact.  IMPRESSION: Cardiac enlargement and pulmonary edema.   Electronically Signed   By: Loralie Champagne M.D.   On: 08/23/2013 14:11   Medications:   . amLODipine  10 mg Oral QHS  . aspirin EC  81 mg Oral QHS  . calcium acetate  2,001 mg Oral TID WC  . [START ON 08/26/2013] darbepoetin  100 mcg Intravenous Q7 days  . escitalopram  20 mg Oral Daily  . insulin aspart  0-15 Units Subcutaneous TID WC  . insulin aspart  0-5 Units Subcutaneous QHS  . insulin glargine  50 Units Subcutaneous QHS  . labetalol  300 mg Oral BID  . metoCLOPramide  5 mg Oral QID  . multivitamin  1 tablet Oral Daily  . predniSONE  2.5 mg Oral Q48H  . predniSONE  5 mg Oral Q48H  . simvastatin  20 mg Oral Q M,W,F  . sodium chloride  3 mL Intravenous Q12H  . tacrolimus  1 mg Oral QHS

## 2013-08-25 NOTE — Progress Notes (Signed)
[name] discharged Home per MD order.  Discharge instructions reviewed and discussed with the patient, all questions and concerns answered. Copy of instructions and scripts given to patient.    Medication List         amLODipine 10 MG tablet  Commonly known as:  NORVASC  Take 10 mg by mouth at bedtime.     aspirin EC 81 MG tablet  Take 81 mg by mouth at bedtime.     calcium acetate 667 MG capsule  Commonly known as:  PHOSLO  Take 3 capsules (2,001 mg total) by mouth 3 (three) times daily with meals.     escitalopram 20 MG tablet  Commonly known as:  LEXAPRO  Take 20 mg by mouth daily.     ferrous sulfate 325 (65 FE) MG tablet  Take 325 mg by mouth 3 (three) times daily with meals.     insulin aspart 100 UNIT/ML injection  Commonly known as:  novoLOG  Inject 12 Units into the skin 3 (three) times daily with meals.     insulin glargine 100 UNIT/ML injection  Commonly known as:  LANTUS  Inject 63 Units into the skin at bedtime.     labetalol 300 MG tablet  Commonly known as:  NORMODYNE  Take 300 mg by mouth 2 (two) times daily.     metoCLOPramide 5 MG tablet  Commonly known as:  REGLAN  Take 5 mg by mouth 4 (four) times daily.     multivitamin Tabs tablet  Take 1 tablet by mouth daily.     predniSONE 5 MG tablet  Commonly known as:  DELTASONE  Take 2.5-5 mg by mouth at bedtime. Alternate daily doses of 2.5mg  and 5mg      simvastatin 20 MG tablet  Commonly known as:  ZOCOR  Take 20 mg by mouth every Monday, Wednesday, and Friday.     tacrolimus 1 MG capsule  Commonly known as:  PROGRAF  Take 1 mg by mouth at bedtime.        Patients skin is clean, dry and intact, no evidence of skin break down. IV site discontinued and catheter remains intact. Site without signs and symptoms of complications. Dressing and pressure applied.  Patient escorted to car by RN in a wheelchair,  no distress noted upon discharge.  Gilman Schmidt 08/25/2013 10:09 PM

## 2013-08-26 LAB — GLUCOSE, CAPILLARY: Glucose-Capillary: 199 mg/dL — ABNORMAL HIGH (ref 70–99)

## 2013-12-18 ENCOUNTER — Emergency Department (HOSPITAL_COMMUNITY)
Admission: EM | Admit: 2013-12-18 | Discharge: 2013-12-18 | Disposition: A | Discharge status: Home / Self Care | Payer: Medicare Other | Attending: Emergency Medicine | Admitting: Emergency Medicine

## 2013-12-18 ENCOUNTER — Encounter (HOSPITAL_COMMUNITY): Payer: Self-pay | Admitting: Emergency Medicine

## 2013-12-18 DIAGNOSIS — D649 Anemia, unspecified: Secondary | ICD-10-CM | POA: Insufficient documentation

## 2013-12-18 DIAGNOSIS — Z8701 Personal history of pneumonia (recurrent): Secondary | ICD-10-CM | POA: Insufficient documentation

## 2013-12-18 DIAGNOSIS — Z940 Kidney transplant status: Secondary | ICD-10-CM | POA: Insufficient documentation

## 2013-12-18 DIAGNOSIS — I120 Hypertensive chronic kidney disease with stage 5 chronic kidney disease or end stage renal disease: Secondary | ICD-10-CM | POA: Insufficient documentation

## 2013-12-18 DIAGNOSIS — Z992 Dependence on renal dialysis: Secondary | ICD-10-CM | POA: Insufficient documentation

## 2013-12-18 DIAGNOSIS — Z8611 Personal history of tuberculosis: Secondary | ICD-10-CM | POA: Insufficient documentation

## 2013-12-18 DIAGNOSIS — Z7982 Long term (current) use of aspirin: Secondary | ICD-10-CM | POA: Insufficient documentation

## 2013-12-18 DIAGNOSIS — N186 End stage renal disease: Secondary | ICD-10-CM | POA: Insufficient documentation

## 2013-12-18 DIAGNOSIS — Z8543 Personal history of malignant neoplasm of ovary: Secondary | ICD-10-CM | POA: Insufficient documentation

## 2013-12-18 DIAGNOSIS — E11319 Type 2 diabetes mellitus with unspecified diabetic retinopathy without macular edema: Secondary | ICD-10-CM | POA: Insufficient documentation

## 2013-12-18 DIAGNOSIS — Z794 Long term (current) use of insulin: Secondary | ICD-10-CM | POA: Insufficient documentation

## 2013-12-18 DIAGNOSIS — Z9483 Pancreas transplant status: Secondary | ICD-10-CM | POA: Insufficient documentation

## 2013-12-18 DIAGNOSIS — E1149 Type 2 diabetes mellitus with other diabetic neurological complication: Secondary | ICD-10-CM | POA: Insufficient documentation

## 2013-12-18 DIAGNOSIS — Z79899 Other long term (current) drug therapy: Secondary | ICD-10-CM | POA: Insufficient documentation

## 2013-12-18 LAB — COMPREHENSIVE METABOLIC PANEL WITH GFR
ALT: 7 U/L (ref 0–35)
AST: 12 U/L (ref 0–37)
Albumin: 3.3 g/dL — ABNORMAL LOW (ref 3.5–5.2)
Alkaline Phosphatase: 62 U/L (ref 39–117)
BUN: 24 mg/dL — ABNORMAL HIGH (ref 6–23)
CO2: 27 meq/L (ref 19–32)
Calcium: 9.3 mg/dL (ref 8.4–10.5)
Chloride: 86 meq/L — ABNORMAL LOW (ref 96–112)
Creatinine, Ser: 5.08 mg/dL — ABNORMAL HIGH (ref 0.50–1.10)
GFR calc Af Amer: 12 mL/min — ABNORMAL LOW
GFR calc non Af Amer: 10 mL/min — ABNORMAL LOW
Glucose, Bld: 394 mg/dL — ABNORMAL HIGH (ref 70–99)
Potassium: 4.3 meq/L (ref 3.7–5.3)
Sodium: 131 meq/L — ABNORMAL LOW (ref 137–147)
Total Bilirubin: 0.4 mg/dL (ref 0.3–1.2)
Total Protein: 8 g/dL (ref 6.0–8.3)

## 2013-12-18 LAB — CBC WITH DIFFERENTIAL/PLATELET
Basophils Absolute: 0 K/uL (ref 0.0–0.1)
Basophils Relative: 0 % (ref 0–1)
Eosinophils Absolute: 0.2 K/uL (ref 0.0–0.7)
Eosinophils Relative: 2 % (ref 0–5)
HCT: 26 % — ABNORMAL LOW (ref 36.0–46.0)
Hemoglobin: 8.7 g/dL — ABNORMAL LOW (ref 12.0–15.0)
Lymphocytes Relative: 9 % — ABNORMAL LOW (ref 12–46)
Lymphs Abs: 0.8 K/uL (ref 0.7–4.0)
MCH: 31.2 pg (ref 26.0–34.0)
MCHC: 33.5 g/dL (ref 30.0–36.0)
MCV: 93.2 fL (ref 78.0–100.0)
Monocytes Absolute: 0.4 K/uL (ref 0.1–1.0)
Monocytes Relative: 4 % (ref 3–12)
Neutro Abs: 7.8 K/uL — ABNORMAL HIGH (ref 1.7–7.7)
Neutrophils Relative %: 85 % — ABNORMAL HIGH (ref 43–77)
Platelets: 338 K/uL (ref 150–400)
RBC: 2.79 MIL/uL — ABNORMAL LOW (ref 3.87–5.11)
RDW: 16.4 % — ABNORMAL HIGH (ref 11.5–15.5)
WBC: 9.2 K/uL (ref 4.0–10.5)

## 2013-12-18 LAB — TYPE AND SCREEN
ABO/RH(D): AB POS
Antibody Screen: NEGATIVE

## 2013-12-18 NOTE — ED Provider Notes (Signed)
CSN: [id number]     Arrival date & time 12/18/13  2027 History   First MD Initiated Contact with Patient 12/18/13 2120     Chief Complaint  Patient presents with  . Anemia     (Consider location/radiation/quality/duration/timing/severity/associated sxs/prior Treatment) HPI Comments: Patient presents emergency department with chief complaints of anemia. She states that she was at hemodialysis today, and was instructed to come to the emergency department because her hemoglobin was low. She states that she has felt a little weak, and a little short of breath. She reports past history of transfusion. She denies any bleeding. She denies lack stools or bloody stools. Denies any vomiting. She states that there are no aggravating or alleviating factors. She denies being in any pain.  The history is provided by the patient. No language interpreter was used.    Past Medical History  Diagnosis Date  . HTN (hypertension)     not well controlled  . 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 History of renal transplant     Hx of pancreas-kidney transplant in 2010, started back on dialysis in Oct 2013. Back on insulin also due to failure of pancreatic graft.    . 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 ESRD on dialysis     "[facility]; TTS; (01/27/2013)  . History of blood transfusion     "lots of times" (01/27/2013)  . CAP (community acquired pneumonia) 2010  . Type II diabetes mellitus   . Anemia   . Ovarian tumor, malignant 2005    resected in [place]' pt denies that this was cancer on 01/27/2013  . Diabetic retinopathy   . Hemodialysis patient    Past Surgical History  Procedure Laterality Date  . Tumor removal  2005    Ovarian, resected in [place]  . Av fistula placement  06/03/2012    Procedure: INSERTION OF ARTERIOVENOUS (AV) GORE-TEX GRAFT ARM;  Surgeon: Rosetta Posner, MD;  Location: [place];  Service: Vascular;  Laterality: Left;  [name] Eye surgery Right     "cause of diabetes" (4/23/20140  . Combined kidney-pancreas transplant  2010    /notes 01/27/2013; @ [facility]  . 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 Renal biopsy  12/13/2010; 10/25/2011    Archie Endo 01/27/2013   Family History  Problem Relation Age of Onset  . Diabetes Mother   . Liver cancer Father   . Cancer Father    History  Substance Use Topics  . Smoking status: Never Smoker   . Smokeless tobacco: Never Used  . Alcohol Use: No   OB History   Grav Para Term Preterm Abortions TAB SAB Ect Mult Living                 Review of Systems  All other systems reviewed and are negative.      Allergies  Ace inhibitors  Home Medications   Current Outpatient Rx  Name  Route  Sig  Dispense  Refill  . amLODipine (NORVASC) 10 MG tablet   Oral   Take 10 mg by mouth at bedtime.          [name] aspirin EC 81 MG tablet   Oral   Take 81 mg by mouth at bedtime.          [name]  calcium acetate (PHOSLO) 667 MG capsule   Oral   Take 3 capsules (2,001 mg total) by mouth 3 (three) times daily with meals.         . cloNIDine (CATAPRES) 0.1 MG tablet   Oral   Take 0.1 mg by mouth 2 (two) times daily.         [name] escitalopram (LEXAPRO) 20 MG tablet   Oral   Take 10 mg by mouth daily.          . insulin aspart (NOVOLOG) 100 UNIT/ML injection   Subcutaneous   Inject 12 Units into the skin 3 (three) times daily with meals.         . insulin glargine (LANTUS) 100 UNIT/ML injection   Subcutaneous   Inject 63 Units into the skin at bedtime.          [name] labetalol (NORMODYNE) 300 MG tablet   Oral   Take 300 mg by mouth 2 (two) times daily.         . metoCLOPramide (REGLAN) 5 MG tablet   Oral   Take 5 mg by mouth 4 (four) times daily.         . multivitamin (RENA-VIT) TABS tablet   Oral   Take 1 tablet by mouth  daily.         . Probiotic Product (PROBIOTIC DAILY) CAPS   Oral   Take 1 capsule by mouth daily.         . simvastatin (ZOCOR) 20 MG tablet   Oral   Take 20 mg by mouth every Monday, Wednesday, and Friday.          BP 151/67  Pulse 97  Temp(Src) 98.5 F (36.9 C) (Oral)  Resp 20  Wt 165 lb 5.5 oz (75 kg)  SpO2 99% Physical Exam  Nursing note and vitals reviewed. Constitutional: She is oriented to person, place, and time. She appears well-developed and well-nourished.  HENT:  Head: Normocephalic and atraumatic.  Eyes: Conjunctivae and EOM are normal. Pupils are equal, round, and reactive to light.  Neck: Normal range of motion. Neck supple.  Cardiovascular: Normal rate and regular rhythm.  Exam reveals no gallop and no friction rub.   No murmur heard. Pulmonary/Chest: Effort normal and breath sounds normal. No respiratory distress. She has no wheezes. She has no rales. She exhibits no tenderness.  Abdominal: Soft. Bowel sounds are normal. She exhibits no distension and no mass. There is no tenderness. There is no rebound and no guarding.  Musculoskeletal: Normal range of motion. She exhibits no edema and no tenderness.  Neurological: She is alert and oriented to person, place, and time.  Skin: Skin is warm and dry.  Psychiatric: She has a normal mood and affect. Her behavior is normal. Judgment and thought content normal.    ED Course  Procedures (including critical care time) Results for orders placed during the hospital encounter of 12/18/13  CBC WITH DIFFERENTIAL      Result Value Ref Range   WBC 9.2  4.0 - 10.5 K/uL   RBC 2.79 (*) 3.87 - 5.11 MIL/uL   Hemoglobin 8.7 (*) 12.0 - 15.0 g/dL   HCT 26.0 (*) 36.0 - 46.0 %   MCV 93.2  78.0 - 100.0 fL   MCH 31.2  26.0 - 34.0 pg   MCHC 33.5  30.0 - 36.0 g/dL   RDW 16.4 (*) 11.5 - 15.5 %   Platelets 338  150 - 400 K/uL   Neutrophils Relative %  85 (*) 43 - 77 %   Neutro Abs 7.8 (*) 1.7 - 7.7 K/uL   Lymphocytes Relative  9 (*) 12 - 46 %   Lymphs Abs 0.8  0.7 - 4.0 K/uL   Monocytes Relative 4  3 - 12 %   Monocytes Absolute 0.4  0.1 - 1.0 K/uL   Eosinophils Relative 2  0 - 5 %   Eosinophils Absolute 0.2  0.0 - 0.7 K/uL   Basophils Relative 0  0 - 1 %   Basophils Absolute 0.0  0.0 - 0.1 K/uL  COMPREHENSIVE METABOLIC PANEL      Result Value Ref Range   Sodium 131 (*) 137 - 147 mEq/L   Potassium 4.3  3.7 - 5.3 mEq/L   Chloride 86 (*) 96 - 112 mEq/L   CO2 27  19 - 32 mEq/L   Glucose, Bld 394 (*) 70 - 99 mg/dL   BUN 24 (*) 6 - 23 mg/dL   Creatinine, Ser 5.08 (*) 0.50 - 1.10 mg/dL   Calcium 9.3  8.4 - 10.5 mg/dL   Total Protein 8.0  6.0 - 8.3 g/dL   Albumin 3.3 (*) 3.5 - 5.2 g/dL   AST 12  0 - 37 U/L   ALT 7  0 - 35 U/L   Alkaline Phosphatase 62  39 - 117 U/L   Total Bilirubin 0.4  0.3 - 1.2 mg/dL   GFR calc non Af Amer 10 (*) >90 mL/min   GFR calc Af Amer 12 (*) >90 mL/min  TYPE AND SCREEN      Result Value Ref Range   ABO/RH(D) AB POS     Antibody Screen NEG     Sample Expiration 12/21/2013     No results found.    EKG Interpretation None      MDM   Final diagnoses:  Anemia    HD patient with reported anemia.  Her HGB is 8.7 here.  Will not transfuse at this time.  Recheck labs on Tuesday at HD.  Patient seen by and discussed with Dr. Wilson Singer, who agrees with the plan.  Patient is asymptomatic.    Montine Circle, PA-C 12/18/13 2315

## 2013-12-18 NOTE — ED Notes (Signed)
Introduced self to pt.  Reports she was sent here for low Hgb <7.  Dialysis pt, last done today.  Denies pain, no complaints.  A&O x 4, skin pk, wm, dry.

## 2013-12-18 NOTE — ED Notes (Signed)
Pt. reports low hemoglobin advised at hemodialysis to go to ER for transfusion . Alert and oriented / respirations unlabored . denies pain . Slight dizziness.

## 2013-12-18 NOTE — Discharge Instructions (Signed)
Your Hemoglobin in the ED is 8.7.   Anemia, Nonspecific Anemia is a condition in which the concentration of red blood cells or hemoglobin in the blood is below normal. Hemoglobin is a substance in red blood cells that carries oxygen to the tissues of the body. Anemia results in not enough oxygen reaching these tissues.  CAUSES  Common causes of anemia include:   Excessive bleeding. Bleeding may be internal or external. This includes excessive bleeding from periods (in women) or from the intestine.   Poor nutrition.   Chronic kidney, thyroid, and liver disease.  Bone marrow disorders that decrease red blood cell production.  Cancer and treatments for cancer.  HIV, AIDS, and their treatments.  Spleen problems that increase red blood cell destruction.  Blood disorders.  Excess destruction of red blood cells due to infection, medicines, and autoimmune disorders. SIGNS AND SYMPTOMS   Minor weakness.   Dizziness.   Headache.  Palpitations.   Shortness of breath, especially with exercise.   Paleness.  Cold sensitivity.  Indigestion.  Nausea.  Difficulty sleeping.  Difficulty concentrating. Symptoms may occur suddenly or they may develop slowly.  DIAGNOSIS  Additional blood tests are often needed. These help your health care provider determine the best treatment. Your health care provider will check your stool for blood and look for other causes of blood loss.  TREATMENT  Treatment varies depending on the cause of the anemia. Treatment can include:   Supplements of iron, vitamin T03, or folic acid.   Hormone medicines.   A blood transfusion. This may be needed if blood loss is severe.   Hospitalization. This may be needed if there is significant continual blood loss.   Dietary changes.  Spleen removal. HOME CARE INSTRUCTIONS Keep all follow-up appointments. It often takes many weeks to correct anemia, and having your health care provider check on  your condition and your response to treatment is very important. SEEK IMMEDIATE MEDICAL CARE IF:   You develop extreme weakness, shortness of breath, or chest pain.   You become dizzy or have trouble concentrating.  You develop heavy vaginal bleeding.   You develop a rash.   You have bloody or black, tarry stools.   You faint.   You vomit up blood.   You vomit repeatedly.   You have abdominal pain.  You have a fever or persistent symptoms for more than 2 3 days.   You have a fever and your symptoms suddenly get worse.   You are dehydrated.  MAKE SURE YOU:  Understand these instructions.  Will watch your condition.  Will get help right away if you are not doing well or get worse. Document Released: 10/31/2004 Document Revised: 05/26/2013 Document Reviewed: 03/19/2013 [facility] Patient Information 2014 [place].

## 2013-12-21 NOTE — ED Provider Notes (Signed)
Medical screening examination/treatment/procedure(s) were conducted as a shared visit with non-physician practitioner(s) and myself.  I personally evaluated the patient during the encounter.   EKG Interpretation None     Patient with anemia. End stage renal disease. She is anemic, but 0.0 transfuse at this time particularly without any acute complaints. No acute distress on exam. Outpatient followup for repeat blood work at hemodialysis. return cautions were discussed.  Virgel Manifold, MD 12/21/13 2257

## 2014-01-18 ENCOUNTER — Emergency Department (HOSPITAL_COMMUNITY)
Admission: EM | Admit: 2014-01-18 | Discharge: 2014-01-18 | Disposition: A | Discharge status: Home / Self Care | Payer: Medicare Other | Attending: Emergency Medicine | Admitting: Emergency Medicine

## 2014-01-18 ENCOUNTER — Encounter (HOSPITAL_COMMUNITY): Payer: Self-pay | Admitting: Emergency Medicine

## 2014-01-18 DIAGNOSIS — D649 Anemia, unspecified: Secondary | ICD-10-CM | POA: Insufficient documentation

## 2014-01-18 DIAGNOSIS — Z8611 Personal history of tuberculosis: Secondary | ICD-10-CM | POA: Insufficient documentation

## 2014-01-18 DIAGNOSIS — Z862 Personal history of diseases of the blood and blood-forming organs and certain disorders involving the immune mechanism: Secondary | ICD-10-CM | POA: Insufficient documentation

## 2014-01-18 DIAGNOSIS — I120 Hypertensive chronic kidney disease with stage 5 chronic kidney disease or end stage renal disease: Secondary | ICD-10-CM | POA: Insufficient documentation

## 2014-01-18 DIAGNOSIS — E1139 Type 2 diabetes mellitus with other diabetic ophthalmic complication: Secondary | ICD-10-CM | POA: Insufficient documentation

## 2014-01-18 DIAGNOSIS — Z8701 Personal history of pneumonia (recurrent): Secondary | ICD-10-CM | POA: Insufficient documentation

## 2014-01-18 DIAGNOSIS — Z79899 Other long term (current) drug therapy: Secondary | ICD-10-CM | POA: Insufficient documentation

## 2014-01-18 DIAGNOSIS — R42 Dizziness and giddiness: Secondary | ICD-10-CM | POA: Insufficient documentation

## 2014-01-18 DIAGNOSIS — Z9981 Dependence on supplemental oxygen: Secondary | ICD-10-CM | POA: Insufficient documentation

## 2014-01-18 DIAGNOSIS — Z7982 Long term (current) use of aspirin: Secondary | ICD-10-CM | POA: Insufficient documentation

## 2014-01-18 DIAGNOSIS — T82838A Hemorrhage of vascular prosthetic devices, implants and grafts, initial encounter: Secondary | ICD-10-CM

## 2014-01-18 DIAGNOSIS — Z8543 Personal history of malignant neoplasm of ovary: Secondary | ICD-10-CM | POA: Insufficient documentation

## 2014-01-18 DIAGNOSIS — E11319 Type 2 diabetes mellitus with unspecified diabetic retinopathy without macular edema: Secondary | ICD-10-CM | POA: Insufficient documentation

## 2014-01-18 DIAGNOSIS — Y841 Kidney dialysis as the cause of abnormal reaction of the patient, or of later complication, without mention of misadventure at the time of the procedure: Secondary | ICD-10-CM | POA: Insufficient documentation

## 2014-01-18 DIAGNOSIS — Z794 Long term (current) use of insulin: Secondary | ICD-10-CM | POA: Insufficient documentation

## 2014-01-18 DIAGNOSIS — N186 End stage renal disease: Secondary | ICD-10-CM | POA: Insufficient documentation

## 2014-01-18 DIAGNOSIS — T82898A Other specified complication of vascular prosthetic devices, implants and grafts, initial encounter: Secondary | ICD-10-CM | POA: Insufficient documentation

## 2014-01-18 DIAGNOSIS — Z940 Kidney transplant status: Secondary | ICD-10-CM | POA: Insufficient documentation

## 2014-01-18 LAB — CBC WITH DIFFERENTIAL/PLATELET
BASOS ABS: 0 10*3/uL (ref 0.0–0.1)
Basophils Relative: 0 % (ref 0–1)
Eosinophils Absolute: 0.2 10*3/uL (ref 0.0–0.7)
Eosinophils Relative: 2 % (ref 0–5)
HCT: 21.9 % — ABNORMAL LOW (ref 36.0–46.0)
HEMOGLOBIN: 7.4 g/dL — AB (ref 12.0–15.0)
LYMPHS PCT: 8 % — AB (ref 12–46)
Lymphs Abs: 0.7 10*3/uL (ref 0.7–4.0)
MCH: 30.2 pg (ref 26.0–34.0)
MCHC: 33.8 g/dL (ref 30.0–36.0)
MCV: 89.4 fL (ref 78.0–100.0)
Monocytes Absolute: 0.6 10*3/uL (ref 0.1–1.0)
Monocytes Relative: 6 % (ref 3–12)
NEUTROS ABS: 7.3 10*3/uL (ref 1.7–7.7)
Neutrophils Relative %: 84 % — ABNORMAL HIGH (ref 43–77)
Platelets: 364 10*3/uL (ref 150–400)
RBC: 2.45 MIL/uL — ABNORMAL LOW (ref 3.87–5.11)
RDW: 15.3 % (ref 11.5–15.5)
WBC: 8.7 10*3/uL (ref 4.0–10.5)

## 2014-01-18 LAB — CBG MONITORING, ED: GLUCOSE-CAPILLARY: 303 mg/dL — AB (ref 70–99)

## 2014-01-18 LAB — BASIC METABOLIC PANEL
BUN: 27 mg/dL — ABNORMAL HIGH (ref 6–23)
CHLORIDE: 91 meq/L — AB (ref 96–112)
CO2: 27 meq/L (ref 19–32)
CREATININE: 5.11 mg/dL — AB (ref 0.50–1.10)
Calcium: 8.8 mg/dL (ref 8.4–10.5)
GFR calc Af Amer: 11 mL/min — ABNORMAL LOW (ref >90)
GFR calc non Af Amer: 10 mL/min — ABNORMAL LOW (ref >90)
Glucose, Bld: 225 mg/dL — ABNORMAL HIGH (ref 70–99)
Potassium: 5.1 mEq/L (ref 3.7–5.3)
Sodium: 134 mEq/L — ABNORMAL LOW (ref 137–147)

## 2014-01-18 LAB — TYPE AND SCREEN
ABO/RH(D): AB POS
Antibody Screen: NEGATIVE

## 2014-01-18 MED ORDER — SODIUM CHLORIDE 0.9 % IV SOLN
INTRAVENOUS | Status: DC
  Administered 2014-01-18: 1 mL via INTRAVENOUS

## 2014-01-18 NOTE — ED Notes (Signed)
MD at bedside. 

## 2014-01-18 NOTE — Discharge Instructions (Signed)
Discussed with broad you. Contact her dialysis site [facility] for termination of arrangement for outpatient blood transfusion for your anemia. Continue dialysis as scheduled. Return for any new or worse symptoms. Keep dressing on the graft site until tomorrow then he can remove it.

## 2014-01-18 NOTE — ED Notes (Signed)
Pressure being held on the AV Graft.

## 2014-01-18 NOTE — ED Notes (Signed)
Fluids stopped for LAB DRAW

## 2014-01-18 NOTE — ED Notes (Signed)
Pt has left sided graft started bleeding when she got home. Finished her dialysis. Controlled with direct pressure. Pt reports feeling dizzy; EMS reports copious blood loss.

## 2014-01-18 NOTE — ED Notes (Signed)
Patient placed on 5 L [place] per her baseline at home.

## 2014-01-18 NOTE — ED Notes (Signed)
AV graft dressing applied. Bleeding has stopped. Nonadherent ABD with ACE Wrap placed.

## 2014-01-18 NOTE — ED Provider Notes (Signed)
CSN: [id number]     Arrival date & time 01/18/14  1305 History   First MD Initiated Contact with Patient 01/18/14 1419     Chief Complaint  Patient presents with  . Vascular Access Problem     (Consider location/radiation/quality/duration/timing/severity/associated sxs/prior Treatment) The history is provided by the patient.   38 year old female dialysis patient. Normally dialyzed Tuesdays Thursdays and Saturdays. Patient was at dialysis today discharged home patient the states that they were planning to transfuse her with blood not exactly sure whether how that was going to be arranged. She wanted do it today. She went home fell asleep woke up with her AV fistula site bleeding blood all over the sheets. EMS called and brought in. Patient still bleeding upon arrival here but with a new dressing and wrapped with an Ace wrap bleeding brought under control. Patient was initially feeling very lightheaded and dizzy but she is normally on 5 L of oxygen at home and was not on any we got her back on her normal amount of oxygen she felt much better.  Past Medical History  Diagnosis Date  . HTN (hypertension)     not well controlled  . 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 History of renal transplant     Hx of pancreas-kidney transplant in 2010, started back on dialysis in Oct 2013. Back on insulin also due to failure of pancreatic graft.    . 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 ESRD on dialysis     "[facility]; TTS; (01/27/2013)  . History of blood transfusion     "lots of times" (01/27/2013)  . CAP (community acquired pneumonia) 2010  . Type II diabetes mellitus   . Anemia   . Ovarian tumor, malignant 2005    resected in [place]' pt denies that this was cancer on 01/27/2013  . Diabetic retinopathy   . Hemodialysis patient    Past Surgical History  Procedure  Laterality Date  . Tumor removal  2005    Ovarian, resected in [place]  . Av fistula placement  06/03/2012    Procedure: INSERTION OF ARTERIOVENOUS (AV) GORE-TEX GRAFT ARM;  Surgeon: Rosetta Posner, MD;  Location: [place];  Service: Vascular;  Laterality: Left;  [name] Eye surgery Right     "cause of diabetes" (4/23/20140  . Combined kidney-pancreas transplant  2010    /notes 01/27/2013; @ [facility]  . 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 Renal biopsy  12/13/2010; 10/25/2011    Archie Endo 01/27/2013   Family History  Problem Relation Age of Onset  . Diabetes Mother   . Liver cancer Father   . Cancer Father    History  Substance Use Topics  . Smoking status: Never Smoker   . Smokeless tobacco: Never Used  . Alcohol Use: No   OB History   Grav Para Term Preterm Abortions TAB SAB Ect Mult Living                 Review of Systems  Constitutional: Negative for fever.  HENT: Negative for congestion.   Eyes: Negative for redness.  Respiratory: Negative for shortness of breath.   Cardiovascular: Negative for chest pain.  Gastrointestinal: Negative for nausea, vomiting and abdominal pain.  Musculoskeletal: Negative for back pain.  Skin: Negative for rash.  Neurological: Positive for dizziness and light-headedness.  Hematological: Bruises/bleeds easily.  Psychiatric/Behavioral: Negative for confusion.      Allergies  Ace inhibitors  Home Medications   Prior to Admission medications   Medication Sig Start Date End Date Taking? Authorizing Provider  amLODipine (NORVASC) 10 MG tablet Take 10 mg by mouth at bedtime.    Yes Historical Provider, MD  aspirin EC 81 MG tablet Take 81 mg by mouth at bedtime.    Yes Historical Provider, MD  calcium acetate (PHOSLO) 667 MG capsule Take 3 capsules (2,001 mg total) by mouth 3 (three) times daily with meals. 01/28/13  Yes Myriam Jacobson, PA-C  cloNIDine (CATAPRES) 0.1 MG tablet Take  0.1 mg by mouth 2 (two) times daily.   Yes Historical Provider, MD  diphenhydrAMINE (BENADRYL) 25 mg capsule Take 25 mg by mouth daily as needed for allergies.   Yes Historical Provider, MD  escitalopram (LEXAPRO) 20 MG tablet Take 10 mg by mouth daily.    Yes Historical Provider, MD  insulin aspart (NOVOLOG) 100 UNIT/ML injection Inject 12 Units into the skin 3 (three) times daily with meals.   Yes Historical Provider, MD  insulin glargine (LANTUS) 100 UNIT/ML injection Inject 63 Units into the skin at bedtime.    Yes Historical Provider, MD  labetalol (NORMODYNE) 300 MG tablet Take 300 mg by mouth 2 (two) times daily.   Yes Historical Provider, MD  metoCLOPramide (REGLAN) 5 MG tablet Take 5 mg by mouth 4 (four) times daily.   Yes Historical Provider, MD  multivitamin (RENA-VIT) TABS tablet Take 1 tablet by mouth daily.   Yes Historical Provider, MD  Probiotic Product (PROBIOTIC DAILY) CAPS Take 1 capsule by mouth daily.   Yes Historical Provider, MD  simvastatin (ZOCOR) 20 MG tablet Take 20 mg by mouth every Monday, Wednesday, and Friday.   Yes Historical Provider, MD   BP 131/79  Pulse 96  Temp(Src) 98.2 F (36.8 C) (Oral)  Resp 16  SpO2 99% Physical Exam  Nursing note and vitals reviewed. Constitutional: She is oriented to person, place, and time. She appears well-developed and well-nourished. No distress.  HENT:  Head: Normocephalic and atraumatic.  Eyes: Conjunctivae and EOM are normal. Pupils are equal, round, and reactive to light.  Neck: Normal range of motion.  Cardiovascular: Normal rate.   Pulmonary/Chest: Effort normal and breath sounds normal.  Abdominal: Soft. Bowel sounds are normal. There is no tenderness.  Musculoskeletal: Normal range of motion.  V. fistulas in the left arm. There is a throat. There was some mild bleeding there prior to arrival with pressure here and a new dressing and wrapped bleeding is controlled.  Neurological: She is alert and oriented to person,  place, and time. No cranial nerve deficit. She exhibits normal muscle tone. Coordination normal.  Skin: Skin is warm. No rash noted.    ED Course  Procedures (including critical care time) Labs Review Labs Reviewed  CBC WITH DIFFERENTIAL - Abnormal; Notable for the following:    RBC 2.45 (*)    Hemoglobin 7.4 (*)    HCT 21.9 (*)    Neutrophils Relative % 84 (*)    Lymphocytes Relative 8 (*)    All other components within normal limits  BASIC METABOLIC PANEL - Abnormal; Notable for the following:    Sodium 134 (*)    Chloride 91 (*)    Glucose, Bld 225 (*)    BUN 27 (*)    Creatinine, Ser 5.11 (*)    GFR  calc non Af Amer 10 (*)    GFR calc Af Amer 11 (*)    All other components within normal limits  CBG MONITORING, ED - Abnormal; Notable for the following:    Glucose-Capillary 303 (*)    All other components within normal limits  TYPE AND SCREEN   Results for orders placed during the hospital encounter of 01/18/14  CBC WITH DIFFERENTIAL      Result Value Ref Range   WBC 8.7  4.0 - 10.5 K/uL   RBC 2.45 (*) 3.87 - 5.11 MIL/uL   Hemoglobin 7.4 (*) 12.0 - 15.0 g/dL   HCT 21.9 (*) 36.0 - 46.0 %   MCV 89.4  78.0 - 100.0 fL   MCH 30.2  26.0 - 34.0 pg   MCHC 33.8  30.0 - 36.0 g/dL   RDW 15.3  11.5 - 15.5 %   Platelets 364  150 - 400 K/uL   Neutrophils Relative % 84 (*) 43 - 77 %   Neutro Abs 7.3  1.7 - 7.7 K/uL   Lymphocytes Relative 8 (*) 12 - 46 %   Lymphs Abs 0.7  0.7 - 4.0 K/uL   Monocytes Relative 6  3 - 12 %   Monocytes Absolute 0.6  0.1 - 1.0 K/uL   Eosinophils Relative 2  0 - 5 %   Eosinophils Absolute 0.2  0.0 - 0.7 K/uL   Basophils Relative 0  0 - 1 %   Basophils Absolute 0.0  0.0 - 0.1 K/uL  BASIC METABOLIC PANEL      Result Value Ref Range   Sodium 134 (*) 137 - 147 mEq/L   Potassium 5.1  3.7 - 5.3 mEq/L   Chloride 91 (*) 96 - 112 mEq/L   CO2 27  19 - 32 mEq/L   Glucose, Bld 225 (*) 70 - 99 mg/dL   BUN 27 (*) 6 - 23 mg/dL   Creatinine, Ser 5.11 (*) 0.50 -  1.10 mg/dL   Calcium 8.8  8.4 - 10.5 mg/dL   GFR calc non Af Amer 10 (*) >90 mL/min   GFR calc Af Amer 11 (*) >90 mL/min  CBG MONITORING, ED      Result Value Ref Range   Glucose-Capillary 303 (*) 70 - 99 mg/dL     Imaging Review No results found.   EKG Interpretation   Date/Time:  Tuesday January 18 2014 13:16:49 EDT Ventricular Rate:  100 PR Interval:  147 QRS Duration: 74 QT Interval:  417 QTC Calculation: 538 R Axis:   93 Text Interpretation:  Sinus tachycardia Probable left atrial enlargement  Borderline right axis deviation LVH with secondary repolarization  abnormality Prolonged QT interval      MDM   Final diagnoses:  None    Left upper arm graft site bleeding under control. Patient's hemoglobin below 8 discussed with nephrology on call. He states she can be arrange for transfusion as an outpatient if she's able to ambulate without significant symptoms and patient once to go home which is the case we will discharge home. Patient normally is dialyzed Tuesdays Thursdays and Saturdays. Patient currently feels much better. Patient improved significantly was begun on room her baseline amount of oxygen which is 5 L.  Discuss with nephrology.  they stated patient frequently has trouble with anemia. They do not feel she needs admissionfor transfusion here today. Unless she is symptomatic. We'll walk the patient if she does okay we'll discharge her home she wants to go home. Nephrology felt  she could receive a blood transfusion as an outpatient. Certainly bleeding is well controlled from the AV fistula site.   Mervin Kung, MD 01/18/14 [phone]

## 2014-01-18 NOTE — ED Notes (Signed)
Pt. Removed ACE Wrap. No bleeding of the AV Graft site. RE-wrapped in Kerlix and gauze

## 2014-01-24 ENCOUNTER — Inpatient Hospital Stay (HOSPITAL_COMMUNITY): Payer: Medicare Other

## 2014-01-24 ENCOUNTER — Encounter (HOSPITAL_COMMUNITY): Payer: Self-pay | Admitting: Emergency Medicine

## 2014-01-24 ENCOUNTER — Emergency Department (HOSPITAL_COMMUNITY): Payer: Medicare Other

## 2014-01-24 ENCOUNTER — Inpatient Hospital Stay (HOSPITAL_COMMUNITY)
Admission: EM | Admit: 2014-01-24 | Discharge: 2014-01-25 | DRG: 811 | Disposition: A | Discharge status: Home / Self Care | Payer: Medicare Other | Attending: Internal Medicine | Admitting: Internal Medicine

## 2014-01-24 DIAGNOSIS — D631 Anemia in chronic kidney disease: Secondary | ICD-10-CM | POA: Diagnosis present

## 2014-01-24 DIAGNOSIS — E11319 Type 2 diabetes mellitus with unspecified diabetic retinopathy without macular edema: Secondary | ICD-10-CM | POA: Diagnosis present

## 2014-01-24 DIAGNOSIS — Z992 Dependence on renal dialysis: Secondary | ICD-10-CM

## 2014-01-24 DIAGNOSIS — E875 Hyperkalemia: Secondary | ICD-10-CM | POA: Diagnosis present

## 2014-01-24 DIAGNOSIS — J9610 Chronic respiratory failure, unspecified whether with hypoxia or hypercapnia: Secondary | ICD-10-CM | POA: Diagnosis present

## 2014-01-24 DIAGNOSIS — N039 Chronic nephritic syndrome with unspecified morphologic changes: Secondary | ICD-10-CM

## 2014-01-24 DIAGNOSIS — Z7982 Long term (current) use of aspirin: Secondary | ICD-10-CM

## 2014-01-24 DIAGNOSIS — E1139 Type 2 diabetes mellitus with other diabetic ophthalmic complication: Secondary | ICD-10-CM | POA: Diagnosis present

## 2014-01-24 DIAGNOSIS — Z940 Kidney transplant status: Secondary | ICD-10-CM

## 2014-01-24 DIAGNOSIS — I120 Hypertensive chronic kidney disease with stage 5 chronic kidney disease or end stage renal disease: Secondary | ICD-10-CM | POA: Diagnosis present

## 2014-01-24 DIAGNOSIS — M899 Disorder of bone, unspecified: Secondary | ICD-10-CM | POA: Diagnosis present

## 2014-01-24 DIAGNOSIS — Z8611 Personal history of tuberculosis: Secondary | ICD-10-CM

## 2014-01-24 DIAGNOSIS — J810 Acute pulmonary edema: Secondary | ICD-10-CM | POA: Diagnosis present

## 2014-01-24 DIAGNOSIS — Z8543 Personal history of malignant neoplasm of ovary: Secondary | ICD-10-CM

## 2014-01-24 DIAGNOSIS — N186 End stage renal disease: Secondary | ICD-10-CM | POA: Diagnosis present

## 2014-01-24 DIAGNOSIS — N189 Chronic kidney disease, unspecified: Secondary | ICD-10-CM | POA: Diagnosis present

## 2014-01-24 DIAGNOSIS — Z9483 Pancreas transplant status: Secondary | ICD-10-CM

## 2014-01-24 DIAGNOSIS — D649 Anemia, unspecified: Principal | ICD-10-CM | POA: Diagnosis present

## 2014-01-24 DIAGNOSIS — Z91199 Patient's noncompliance with other medical treatment and regimen due to unspecified reason: Secondary | ICD-10-CM

## 2014-01-24 DIAGNOSIS — M949 Disorder of cartilage, unspecified: Secondary | ICD-10-CM

## 2014-01-24 DIAGNOSIS — C569 Malignant neoplasm of unspecified ovary: Secondary | ICD-10-CM

## 2014-01-24 DIAGNOSIS — I10 Essential (primary) hypertension: Secondary | ICD-10-CM

## 2014-01-24 DIAGNOSIS — E119 Type 2 diabetes mellitus without complications: Secondary | ICD-10-CM | POA: Diagnosis present

## 2014-01-24 DIAGNOSIS — Z9119 Patient's noncompliance with other medical treatment and regimen: Secondary | ICD-10-CM

## 2014-01-24 DIAGNOSIS — E785 Hyperlipidemia, unspecified: Secondary | ICD-10-CM

## 2014-01-24 DIAGNOSIS — Z9981 Dependence on supplemental oxygen: Secondary | ICD-10-CM

## 2014-01-24 HISTORY — DX: Dependence on supplemental oxygen: Z99.81

## 2014-01-24 LAB — CBC WITH DIFFERENTIAL/PLATELET
Basophils Absolute: 0 10*3/uL (ref 0.0–0.1)
Basophils Relative: 0 % (ref 0–1)
EOS PCT: 2 % (ref 0–5)
Eosinophils Absolute: 0.2 10*3/uL (ref 0.0–0.7)
HEMATOCRIT: 17.9 % — AB (ref 36.0–46.0)
HEMOGLOBIN: 5.8 g/dL — AB (ref 12.0–15.0)
LYMPHS PCT: 7 % — AB (ref 12–46)
Lymphs Abs: 0.9 10*3/uL (ref 0.7–4.0)
MCH: 30.2 pg (ref 26.0–34.0)
MCHC: 32.4 g/dL (ref 30.0–36.0)
MCV: 93.2 fL (ref 78.0–100.0)
MONO ABS: 0.4 10*3/uL (ref 0.1–1.0)
MONOS PCT: 3 % (ref 3–12)
NEUTROS ABS: 10.7 10*3/uL — AB (ref 1.7–7.7)
Neutrophils Relative %: 88 % — ABNORMAL HIGH (ref 43–77)
Platelets: 308 10*3/uL (ref 150–400)
RBC: 1.92 MIL/uL — ABNORMAL LOW (ref 3.87–5.11)
RDW: 17.3 % — AB (ref 11.5–15.5)
WBC: 12.1 10*3/uL — ABNORMAL HIGH (ref 4.0–10.5)

## 2014-01-24 LAB — BASIC METABOLIC PANEL
BUN: 58 mg/dL — AB (ref 6–23)
CHLORIDE: 90 meq/L — AB (ref 96–112)
CO2: 21 mEq/L (ref 19–32)
Calcium: 9.4 mg/dL (ref 8.4–10.5)
Creatinine, Ser: 8.05 mg/dL — ABNORMAL HIGH (ref 0.50–1.10)
GFR calc Af Amer: 7 mL/min — ABNORMAL LOW (ref >90)
GFR calc non Af Amer: 6 mL/min — ABNORMAL LOW (ref >90)
Glucose, Bld: 203 mg/dL — ABNORMAL HIGH (ref 70–99)
Potassium: 5.6 mEq/L — ABNORMAL HIGH (ref 3.7–5.3)
Sodium: 132 mEq/L — ABNORMAL LOW (ref 137–147)

## 2014-01-24 LAB — TROPONIN I: Troponin I: <0.3 ng/mL (ref <0.30)

## 2014-01-24 LAB — GLUCOSE, CAPILLARY: Glucose-Capillary: 141 mg/dL — ABNORMAL HIGH (ref 70–99)

## 2014-01-24 LAB — HCG, SERUM, QUALITATIVE: PREG SERUM: NEGATIVE

## 2014-01-24 LAB — PREPARE RBC (CROSSMATCH)

## 2014-01-24 MED ORDER — ACETAMINOPHEN 650 MG RE SUPP: 650.0000 mg | Freq: Four times a day (QID) | RECTAL | Status: DC | PRN

## 2014-01-24 MED ORDER — CLONIDINE HCL 0.1 MG PO TABS
0.1000 mg | ORAL_TABLET | Freq: Two times a day (BID) | ORAL | Status: DC
  Administered 2014-01-24: 0.1 mg via ORAL
  Filled 2014-01-24 (×2): qty 1

## 2014-01-24 MED ORDER — LIDOCAINE HCL (PF) 1 % IJ SOLN: 5.0000 mL | INTRAMUSCULAR | Status: DC | PRN

## 2014-01-24 MED ORDER — PENTAFLUOROPROP-TETRAFLUOROETH EX AERO: 1.0000 "application " | INHALATION_SPRAY | CUTANEOUS | Status: DC | PRN

## 2014-01-24 MED ORDER — ONDANSETRON HCL 4 MG/2ML IJ SOLN: 4.0000 mg | Freq: Four times a day (QID) | INTRAMUSCULAR | Status: DC | PRN

## 2014-01-24 MED ORDER — HYDROXYZINE HCL 25 MG PO TABS
25.0000 mg | ORAL_TABLET | Freq: Three times a day (TID) | ORAL | Status: DC | PRN
  Administered 2014-01-24: 25 mg via ORAL

## 2014-01-24 MED ORDER — SODIUM CHLORIDE 0.9 % IV SOLN: 100.0000 mL | INTRAVENOUS | Status: DC | PRN

## 2014-01-24 MED ORDER — HEPARIN SODIUM (PORCINE) 1000 UNIT/ML DIALYSIS: 1000.0000 [IU] | INTRAMUSCULAR | Status: DC | PRN

## 2014-01-24 MED ORDER — CALCIUM ACETATE 667 MG PO CAPS
2001.0000 mg | ORAL_CAPSULE | Freq: Three times a day (TID) | ORAL | Status: DC
  Administered 2014-01-25 (×2): 2001 mg via ORAL
  Filled 2014-01-24 (×5): qty 3

## 2014-01-24 MED ORDER — HEPARIN SODIUM (PORCINE) 1000 UNIT/ML DIALYSIS
2400.0000 [IU] | Freq: Once | INTRAMUSCULAR | Status: DC
  Filled 2014-01-24: qty 3

## 2014-01-24 MED ORDER — NEPRO/CARBSTEADY PO LIQD: 237.0000 mL | ORAL | Status: DC | PRN

## 2014-01-24 MED ORDER — ACETAMINOPHEN 325 MG PO TABS: 650.0000 mg | ORAL_TABLET | Freq: Four times a day (QID) | ORAL | Status: DC | PRN

## 2014-01-24 MED ORDER — SODIUM CHLORIDE 0.9 % IJ SOLN
3.0000 mL | Freq: Two times a day (BID) | INTRAMUSCULAR | Status: DC
Start: 2014-01-24 — End: 2014-01-26

## 2014-01-24 MED ORDER — SODIUM CHLORIDE 0.9 % IV SOLN
125.0000 mg | INTRAVENOUS | Status: DC
  Administered 2014-01-24: 125 mg via INTRAVENOUS
  Filled 2014-01-24: qty 10

## 2014-01-24 MED ORDER — HEPARIN SODIUM (PORCINE) 5000 UNIT/ML IJ SOLN
5000.0000 [IU] | Freq: Three times a day (TID) | INTRAMUSCULAR | Status: DC
  Administered 2014-01-24 – 2014-01-25 (×2): 5000 [IU] via SUBCUTANEOUS
  Filled 2014-01-24 (×5): qty 1

## 2014-01-24 MED ORDER — HYDROXYZINE HCL 25 MG PO TABS
ORAL_TABLET | ORAL | Status: AC
  Filled 2014-01-24: qty 1

## 2014-01-24 MED ORDER — ONDANSETRON HCL 4 MG PO TABS: 4.0000 mg | ORAL_TABLET | Freq: Four times a day (QID) | ORAL | Status: DC | PRN

## 2014-01-24 MED ORDER — DIPHENHYDRAMINE HCL 25 MG PO CAPS: 25.0000 mg | ORAL_CAPSULE | Freq: Every day | ORAL | Status: DC | PRN

## 2014-01-24 MED ORDER — METOCLOPRAMIDE HCL 5 MG PO TABS
5.0000 mg | ORAL_TABLET | Freq: Three times a day (TID) | ORAL | Status: DC
  Administered 2014-01-24 – 2014-01-25 (×3): 5 mg via ORAL
  Filled 2014-01-24 (×6): qty 1

## 2014-01-24 MED ORDER — ZOLPIDEM TARTRATE 5 MG PO TABS
5.0000 mg | ORAL_TABLET | Freq: Every evening | ORAL | Status: DC | PRN
Start: 2014-01-24 — End: 2014-01-26

## 2014-01-24 MED ORDER — ALBUTEROL SULFATE (2.5 MG/3ML) 0.083% IN NEBU: 2.5000 mg | INHALATION_SOLUTION | RESPIRATORY_TRACT | Status: DC | PRN

## 2014-01-24 MED ORDER — PROBIOTIC DAILY PO CAPS: 1.0000 | ORAL_CAPSULE | Freq: Every day | ORAL | Status: DC

## 2014-01-24 MED ORDER — LABETALOL HCL 300 MG PO TABS
300.0000 mg | ORAL_TABLET | Freq: Two times a day (BID) | ORAL | Status: DC
  Administered 2014-01-24: 300 mg via ORAL
  Filled 2014-01-24 (×4): qty 1

## 2014-01-24 MED ORDER — SIMVASTATIN 20 MG PO TABS
20.0000 mg | ORAL_TABLET | ORAL | Status: DC
  Filled 2014-01-24: qty 1

## 2014-01-24 MED ORDER — ASPIRIN EC 81 MG PO TBEC
81.0000 mg | DELAYED_RELEASE_TABLET | Freq: Every day | ORAL | Status: DC
  Administered 2014-01-24: 81 mg via ORAL
  Filled 2014-01-24 (×2): qty 1

## 2014-01-24 MED ORDER — SODIUM CHLORIDE 0.9 % IJ SOLN: 3.0000 mL | INTRAMUSCULAR | Status: DC | PRN

## 2014-01-24 MED ORDER — SODIUM CHLORIDE 0.9 % IV SOLN: INTRAVENOUS | Status: DC

## 2014-01-24 MED ORDER — CALCIUM CARBONATE 1250 MG/5ML PO SUSP: 500.0000 mg | Freq: Four times a day (QID) | ORAL | Status: DC | PRN

## 2014-01-24 MED ORDER — ESCITALOPRAM OXALATE 10 MG PO TABS
10.0000 mg | ORAL_TABLET | Freq: Every day | ORAL | Status: DC
  Administered 2014-01-25: 10 mg via ORAL
  Filled 2014-01-24 (×2): qty 1

## 2014-01-24 MED ORDER — SACCHAROMYCES BOULARDII 250 MG PO CAPS
250.0000 mg | ORAL_CAPSULE | Freq: Every day | ORAL | Status: DC
  Administered 2014-01-25: 250 mg via ORAL
  Filled 2014-01-24: qty 1

## 2014-01-24 MED ORDER — AMLODIPINE BESYLATE 10 MG PO TABS
10.0000 mg | ORAL_TABLET | Freq: Every day | ORAL | Status: DC
  Administered 2014-01-24: 10 mg via ORAL
  Filled 2014-01-24 (×2): qty 1

## 2014-01-24 MED ORDER — RENA-VITE PO TABS
1.0000 | ORAL_TABLET | Freq: Every day | ORAL | Status: DC
Start: 2014-01-24 — End: 2014-01-26
  Administered 2014-01-24: 1 via ORAL
  Filled 2014-01-24 (×2): qty 1

## 2014-01-24 MED ORDER — DOCUSATE SODIUM 283 MG RE ENEM: 1.0000 | ENEMA | RECTAL | Status: DC | PRN

## 2014-01-24 MED ORDER — SODIUM CHLORIDE 0.9 % IV SOLN: 250.0000 mL | INTRAVENOUS | Status: DC | PRN

## 2014-01-24 MED ORDER — DARBEPOETIN ALFA-POLYSORBATE 100 MCG/0.5ML IJ SOLN
INTRAMUSCULAR | Status: AC
  Filled 2014-01-24: qty 0.5

## 2014-01-24 MED ORDER — CAMPHOR-MENTHOL 0.5-0.5 % EX LOTN
1.0000 "application " | TOPICAL_LOTION | Freq: Three times a day (TID) | CUTANEOUS | Status: DC | PRN
  Filled 2014-01-24: qty 222

## 2014-01-24 MED ORDER — NEPRO/CARBSTEADY PO LIQD: 237.0000 mL | Freq: Three times a day (TID) | ORAL | Status: DC | PRN

## 2014-01-24 MED ORDER — DARBEPOETIN ALFA-POLYSORBATE 100 MCG/0.5ML IJ SOLN
100.0000 ug | INTRAMUSCULAR | Status: DC
  Administered 2014-01-24: 100 ug via INTRAVENOUS
  Filled 2014-01-24: qty 0.5

## 2014-01-24 MED ORDER — ALTEPLASE 2 MG IJ SOLR: 2.0000 mg | Freq: Once | INTRAMUSCULAR | Status: AC | PRN

## 2014-01-24 MED ORDER — DOXERCALCIFEROL 4 MCG/2ML IV SOLN
2.0000 ug | INTRAVENOUS | Status: DC
  Administered 2014-01-24: 2 ug via INTRAVENOUS

## 2014-01-24 MED ORDER — LIDOCAINE-PRILOCAINE 2.5-2.5 % EX CREA: 1.0000 "application " | TOPICAL_CREAM | CUTANEOUS | Status: DC | PRN

## 2014-01-24 MED ORDER — SORBITOL 70 % SOLN: 30.0000 mL | Status: DC | PRN

## 2014-01-24 MED ORDER — ALTEPLASE 2 MG IJ SOLR: 2.0000 mg | Freq: Once | INTRAMUSCULAR | Status: DC | PRN

## 2014-01-24 MED ORDER — DOXERCALCIFEROL 4 MCG/2ML IV SOLN
INTRAVENOUS | Status: AC
  Filled 2014-01-24: qty 2

## 2014-01-24 MED ORDER — INSULIN ASPART 100 UNIT/ML ~~LOC~~ SOLN
0.0000 [IU] | Freq: Three times a day (TID) | SUBCUTANEOUS | Status: DC
Start: 2014-01-24 — End: 2014-01-26
  Administered 2014-01-25: 3 [IU] via SUBCUTANEOUS
  Administered 2014-01-25: 5 [IU] via SUBCUTANEOUS

## 2014-01-24 MED ORDER — HYDROCODONE-ACETAMINOPHEN 5-325 MG PO TABS
1.0000 | ORAL_TABLET | ORAL | Status: DC | PRN
  Administered 2014-01-24 – 2014-01-25 (×4): 2 via ORAL
  Filled 2014-01-24 (×4): qty 2

## 2014-01-24 MED ORDER — INSULIN ASPART 100 UNIT/ML ~~LOC~~ SOLN
12.0000 [IU] | Freq: Three times a day (TID) | SUBCUTANEOUS | Status: DC
  Administered 2014-01-25 (×2): 12 [IU] via SUBCUTANEOUS

## 2014-01-24 MED ORDER — INSULIN GLARGINE 100 UNIT/ML ~~LOC~~ SOLN
40.0000 [IU] | Freq: Every day | SUBCUTANEOUS | Status: DC
  Administered 2014-01-25: 40 [IU] via SUBCUTANEOUS
  Filled 2014-01-24 (×3): qty 0.4

## 2014-01-24 MED ORDER — MORPHINE SULFATE 2 MG/ML IJ SOLN: 1.0000 mg | INTRAMUSCULAR | Status: DC | PRN

## 2014-01-24 MED ORDER — FENTANYL CITRATE 0.05 MG/ML IJ SOLN
50.0000 ug | INTRAMUSCULAR | Status: DC | PRN
  Filled 2014-01-24: qty 2

## 2014-01-24 NOTE — ED Notes (Signed)
Warm blankets given.

## 2014-01-24 NOTE — ED Notes (Signed)
Pt c/o left flank pain and low HGB since last week the patient seen here for same at that time.

## 2014-01-24 NOTE — ED Notes (Signed)
Report given to RN on 6E- and to Pod C RN moved to pod C

## 2014-01-24 NOTE — Procedures (Signed)
I was present at this dialysis session, have reviewed the session itself and made  appropriate changes  Kelly Splinter MD (pgr) [phone]    [phone] 01/24/2014, 5:46 PM

## 2014-01-24 NOTE — H&P (Signed)
[facility] History and Physical  [name] NUU:[id number] DOB: [birth year] DOA: 01/24/2014  Referring physician: Thurnell Garbe.  PCP: Tommy Medal, MD   Chief Complaint: low hb, right side flank pain.   HPI: [name] is a 38 y.o. female with PMH significant for ESRD dialysis T, Thu, Sat, history of anemia prior work up done at [facility], anemia thought to be anemia of chronic diseases, on Home oxygen 5 L unclear diagnosis, history of pancreas transplant who presents to ED complaining of right side flank pain. She was also refer to ED because of abnormal lab work. She was called by dialysis unit today, she was told her Hb was low. In the ED patient was found to have Hb at 5.9, chest x ray with bilateral pulmonary edema vs infiltrates. 2 units of PRBC ordered for transfusion. Renal has been consulted.   Patient denies cough, fever, abdominal pain, constipation, diarrhea. No melena, hematochezia.    Review of Systems:  Negative except as per HPI.   Past Medical History  Diagnosis Date  . HTN (hypertension)     not well controlled  . 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 History of renal transplant     Hx of pancreas-kidney transplant in 2010, started back on dialysis in Oct 2013. Back on insulin also due to failure of pancreatic graft.    . 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 ESRD on dialysis     "[facility]; TTS; (01/27/2013)  . History of blood transfusion     "lots of times" (01/27/2013)  . CAP (community acquired pneumonia) 2010  . Type II diabetes mellitus   . Anemia   . Ovarian tumor, malignant 2005    resected in [place]' pt denies that this was cancer on 01/27/2013  . Diabetic retinopathy   . Hemodialysis patient    Past Surgical History  Procedure Laterality Date  . Tumor removal  2005    Ovarian, resected in [place]  . Av fistula placement   06/03/2012    Procedure: INSERTION OF ARTERIOVENOUS (AV) GORE-TEX GRAFT ARM;  Surgeon: Rosetta Posner, MD;  Location: [facility];  Service: Vascular;  Laterality: Left;  [name] Eye surgery Right     "cause of diabetes" (4/23/20140  . Combined kidney-pancreas transplant  2010    /notes 01/27/2013; @ [facility]  . 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 Renal biopsy  12/13/2010; 10/25/2011    Archie Endo 01/27/2013   Social History:  reports that she has never smoked. She has never used smokeless tobacco. She reports that she does not drink alcohol or use illicit drugs.  Allergies  Allergen Reactions  . Ace Inhibitors Cough    Family History  Problem Relation Age of Onset  . Diabetes Mother   . Liver cancer Father   . Cancer Father      Prior to Admission medications   Medication Sig Start Date End Date Taking? Authorizing Provider  acetaminophen (TYLENOL) 500 MG tablet Take 1,000 mg by mouth every 6 (six) hours as needed for moderate pain.   Yes Historical Provider, MD  amLODipine (NORVASC) 10 MG tablet Take 10 mg by mouth at bedtime.    Yes Historical Provider, MD  aspirin EC 81 MG tablet Take 81 mg by mouth at bedtime.  Yes Historical Provider, MD  calcium acetate (PHOSLO) 667 MG capsule Take 3 capsules (2,001 mg total) by mouth 3 (three) times daily with meals. 01/28/13  Yes Myriam Jacobson, PA-C  cloNIDine (CATAPRES) 0.1 MG tablet Take 0.1 mg by mouth 2 (two) times daily.   Yes Historical Provider, MD  diphenhydrAMINE (BENADRYL) 25 mg capsule Take 25 mg by mouth daily as needed for allergies.   Yes Historical Provider, MD  escitalopram (LEXAPRO) 20 MG tablet Take 10 mg by mouth daily.    Yes Historical Provider, MD  insulin aspart (NOVOLOG) 100 UNIT/ML injection Inject 12 Units into the skin 3 (three) times daily with meals.   Yes Historical Provider, MD  insulin glargine (LANTUS) 100 UNIT/ML injection Inject 63 Units into the skin  at bedtime.    Yes Historical Provider, MD  labetalol (NORMODYNE) 300 MG tablet Take 300 mg by mouth 2 (two) times daily.   Yes Historical Provider, MD  metoCLOPramide (REGLAN) 5 MG tablet Take 5 mg by mouth 4 (four) times daily.   Yes Historical Provider, MD  multivitamin (RENA-VIT) TABS tablet Take 1 tablet by mouth daily.   Yes Historical Provider, MD  Probiotic Product (PROBIOTIC DAILY) CAPS Take 1 capsule by mouth daily.   Yes Historical Provider, MD  simvastatin (ZOCOR) 20 MG tablet Take 20 mg by mouth every Monday, Wednesday, and Friday.   Yes Historical Provider, MD   Physical Exam: Filed Vitals:   01/24/14 1251  BP: 158/132  Pulse: 95  Temp:   Resp:     BP 158/132  Pulse 95  Temp(Src) 98.2 F (36.8 C)  Resp 22  Ht 5\' 7"  (1.702 m)  Wt 80 kg (176 lb 5.9 oz)  BMI 27.62 kg/m2  SpO2 100%  LMP 01/03/2014  General:  Appears calm and comfortable Eyes: PERRL, normal lids, irises & conjunctiva ENT: grossly normal hearing, lips & tongue Neck: no LAD, masses or thyromegaly Cardiovascular: RRR, no m/r/g. No LE edema. Telemetry: SR, no arrhythmias  Respiratory: bilateral crackles, Normal respiratory effort. Abdomen: soft, nt,nd, right side flank tenderness.  Skin: no rash or induration seen on limited exam Musculoskeletal: grossly normal tone BUE/BLE Neurologic: grossly non-focal.          Labs on Admission:  Basic Metabolic Panel:  Recent Labs Lab 01/18/14 1428 01/24/14 1200  NA 134* 132*  K 5.1 5.6*  CL 91* 90*  CO2 27 21  GLUCOSE 225* 203*  BUN 27* 58*  CREATININE 5.11* 8.05*  CALCIUM 8.8 9.4   Liver Function Tests: No results found for this basename: AST, ALT, ALKPHOS, BILITOT, PROT, ALBUMIN,  in the last 168 hours No results found for this basename: LIPASE, AMYLASE,  in the last 168 hours No results found for this basename: AMMONIA,  in the last 168 hours CBC:  Recent Labs Lab 01/18/14 1428 01/24/14 1200  WBC 8.7 12.1*  NEUTROABS 7.3 10.7*  HGB  7.4* 5.8*  HCT 21.9* 17.9*  MCV 89.4 93.2  PLT 364 308   Cardiac Enzymes:  Recent Labs Lab 01/24/14 1144  TROPONINI <0.30    BNP (last 3 results)  Recent Labs  02/01/13 2344  PROBNP 58488.0*   CBG:  Recent Labs Lab 01/18/14 1321  GLUCAP 303*    Radiological Exams on Admission: Dg Chest 2 View  01/24/2014   CLINICAL DATA:  Flank pain. History of dialysis, hypertension and diabetes.  EXAM: CHEST  2 VIEW  COMPARISON:  DG CHEST 1V dated 10/01/2013; DG CHEST 2 VIEW dated  08/25/2013  FINDINGS: Cardiomegaly and chronic central airway thickening are stable. The previously demonstrated bilateral airspace opacities have partially cleared. However, there are residual or recurrent perihilar and lower lobe airspace opacities bilaterally associated with diffuse fissural thickening. There is no significant pleural effusion.  IMPRESSION: Bilateral perihilar and lower lobe airspace opacities, likely representing recurrent edema or inflammation. The findings are not as severe as on the most recent study of 4 months ago.   Electronically Signed   By: Camie Patience M.D.   On: 01/24/2014 12:59    EKG: Independently reviewed. Sinus, prolong Q-T.   Assessment/Plan Active Problems:   Anemia   DM (diabetes mellitus)   Symptomatic anemia   1-Anemia; symptomatic. 2 units of PRBC for transfusion ordered. She has had prior GI and hematology evaluation at [facility]. She last follow up with her hematologist last month. She was told she doesn't need to follow up with then any more.   2-ESRD; renal consulted. Need dialysis. Chest x ray with bilateral pulmonary edema vs infiltrates. She is home oxygen.   3-Respiratory failure, chronic; unclear etiology,  on home oxygen 5 L. Chest x ray bilateral perihilar and lower lobe airspace opacities, likely representing recurrent edema or inflammation. Need dialysis. No fevers. Hold on antibiotics. Repeat chest x ray after dialysis to follow up infiltrates. If patient  spike fever will need to start antibiotics.   4-Diabetes; continue with lantus but will decrease dose to 40 from 63.   5- right side flank pain; CT scan pending.    Code Status: presume full code.  Family Communication: care discussed with patient.  Disposition Plan: expect 2 to 3 days depending on improvement of medical condition.   Time spent: 75 minutes.   [facility] Pager [phone]

## 2014-01-24 NOTE — ED Notes (Signed)
Unable to do orthostatic VS due to pt's pain.

## 2014-01-24 NOTE — ED Notes (Signed)
Patient refused laying and standing of ortho. Too much pain

## 2014-01-24 NOTE — Consult Note (Signed)
Renal Service Consult Note [facility]  [name] 01/24/2014 Sol Blazing Requesting Physician:  Dr Tyrell Antonio  Reason for Consult:  ESRD patient with anemia, SOB and gen weakness HPI: The patient is a 38 y.o. year-old with hx of HTN, HL, ESRD , failed renal transplant, pancreas transplant, hx of TB, hx of ovarian Ca. Pt presented to ED today with L flank pain and low Hb noted at OP dialysis labs. Hb here was confirmed at 5.8.  Also reporting SOB and CXR showing bibasilar infiltrates edema vs infection. Abd CT done for flank pain and shows bibasilar infiltrative changes, bilat pleural effusions.  Pt has hx of high weight gains between HD sessions and has been here in the past for volume issues.    ROS  no fever, + cough  no purulent sputum  no CP  + R flank pain  no abd pain  no N/v/d  no jt pain  Past Medical History  Past Medical History  Diagnosis Date  . HTN (hypertension)     not well controlled  . 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 History of renal transplant     Hx of pancreas-kidney transplant in 2010, started back on dialysis in Oct 2013. Back on insulin also due to failure of pancreatic graft.    . 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 ESRD on dialysis     "[facility]; TTS; (01/27/2013)  . History of blood transfusion     "lots of times" (01/27/2013)  . CAP (community acquired pneumonia) 2010  . Type II diabetes mellitus   . Anemia   . Ovarian tumor, malignant 2005    resected in [place]' pt denies that this was cancer on 01/27/2013  . Diabetic retinopathy   . Hemodialysis patient    Past Surgical History  Past Surgical History  Procedure Laterality Date  . Tumor removal  2005    Ovarian, resected in [place]  . Av fistula placement  06/03/2012    Procedure: INSERTION OF ARTERIOVENOUS (AV) GORE-TEX GRAFT ARM;   Surgeon: Rosetta Posner, MD;  Location: [place];  Service: Vascular;  Laterality: Left;  [name] Eye surgery Right     "cause of diabetes" (4/23/20140  . Combined kidney-pancreas transplant  2010    /notes 01/27/2013; @ [facility]  . 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 Renal biopsy  12/13/2010; 10/25/2011    Archie Endo 01/27/2013   Family History  Family History  Problem Relation Age of Onset  . Diabetes Mother   . Liver cancer Father   . Cancer Father    Social History  reports that she has never smoked. She has never used smokeless tobacco. She reports that she does not drink alcohol or use illicit drugs. Allergies  Allergies  Allergen Reactions  . Ace Inhibitors Cough   Home medications Prior to Admission medications   Medication Sig Start Date End Date Taking? Authorizing Provider  acetaminophen (TYLENOL) 500 MG tablet Take 1,000 mg by mouth every 6 (six) hours as needed for moderate pain.   Yes Historical Provider, MD  amLODipine (NORVASC) 10 MG tablet Take 10 mg by mouth at bedtime.    Yes Historical Provider, MD  aspirin EC 81 MG tablet Take 81 mg by mouth at bedtime.  Yes Historical Provider, MD  calcium acetate (PHOSLO) 667 MG capsule Take 3 capsules (2,001 mg total) by mouth 3 (three) times daily with meals. 01/28/13  Yes Myriam Jacobson, PA-C  cloNIDine (CATAPRES) 0.1 MG tablet Take 0.1 mg by mouth 2 (two) times daily.   Yes Historical Provider, MD  diphenhydrAMINE (BENADRYL) 25 mg capsule Take 25 mg by mouth daily as needed for allergies.   Yes Historical Provider, MD  escitalopram (LEXAPRO) 20 MG tablet Take 10 mg by mouth daily.    Yes Historical Provider, MD  insulin aspart (NOVOLOG) 100 UNIT/ML injection Inject 12 Units into the skin 3 (three) times daily with meals.   Yes Historical Provider, MD  insulin glargine (LANTUS) 100 UNIT/ML injection Inject 63 Units into the skin at bedtime.    Yes Historical Provider,  MD  labetalol (NORMODYNE) 300 MG tablet Take 300 mg by mouth 2 (two) times daily.   Yes Historical Provider, MD  metoCLOPramide (REGLAN) 5 MG tablet Take 5 mg by mouth 4 (four) times daily.   Yes Historical Provider, MD  multivitamin (RENA-VIT) TABS tablet Take 1 tablet by mouth daily.   Yes Historical Provider, MD  Probiotic Product (PROBIOTIC DAILY) CAPS Take 1 capsule by mouth daily.   Yes Historical Provider, MD  simvastatin (ZOCOR) 20 MG tablet Take 20 mg by mouth every Monday, Wednesday, and Friday.   Yes Historical Provider, MD   Liver Function Tests No results found for this basename: AST, ALT, ALKPHOS, BILITOT, PROT, ALBUMIN,  in the last 168 hours No results found for this basename: LIPASE, AMYLASE,  in the last 168 hours CBC  Recent Labs Lab 01/18/14 1428 01/24/14 1200  WBC 8.7 12.1*  NEUTROABS 7.3 10.7*  HGB 7.4* 5.8*  HCT 21.9* 17.9*  MCV 89.4 93.2  PLT 364 053   Basic Metabolic Panel  Recent Labs Lab 01/18/14 1428 01/24/14 1200  NA 134* 132*  K 5.1 5.6*  CL 91* 90*  CO2 27 21  GLUCOSE 225* 203*  BUN 27* 58*  CREATININE 5.11* 8.05*  CALCIUM 8.8 9.4    Filed Vitals:   01/24/14 1420 01/24/14 1445 01/24/14 1520 01/24/14 1530  BP:  161/76 162/72   Pulse: 97 100 101   Temp:  98 F (36.7 C)  96.7 F (35.9 C)  Resp: 25 25 30    Height:      Weight:      SpO2: 97% 96% 95%    Exam: Looks tired , not in distress, dyspneic with minor exertion No rash, cyanosis or gangrene Sclera anicteric, throat clear +JVD Bilat raled 1/3 up post , no bronchial BS or wheezing RRR no MRG Abd soft, NT, ND; +R CVA tenderness No leg or UE edema L upper ext AVG patent Neuro is NF, ox 3   Dialysis: TTS AF 4h   71kg   2/2.25 [place]  L arm AVG   Heparin 2400     Hectorol 2     Epo 20,000     Venofer 100 q hd x 3 more  Assessment: 1 Cough / DOE / pulm infiltrates- suspect volume overload, can't r/o PNA as well but would prefer to treat as vol overload first to see if there  is resolution of CXR findings.  2 ESRD  3 HTN on BB, norvasc, clonidine 4 MBD cont vit D, binders 5 Hx prior pancreas and renal transplant- is not on any immunosuppressive medication 6 DM2 7 Anemia- transfusing, cont epo 8 Volume- up 7-8kg over dry  wt 9 Hyperkalemia   Plan- HD acutely today, and again tomorrow. Repeat films after HD tomorrow   Kelly Splinter MD (pgr) [phone]    [phone] 01/24/2014, 4:55 PM

## 2014-01-24 NOTE — ED Notes (Signed)
Pt states was called by dialysis center because hgb was low-- to come in for a transfusion. Pt is T, TH, S dialysis pt.

## 2014-01-24 NOTE — ED Provider Notes (Signed)
CSN: [id number]     Arrival date & time 01/24/14  1014 History   First MD Initiated Contact with Patient 01/24/14 1114     Chief Complaint  Patient presents with  . Flank Pain  . Abnormal Lab      HPI Pt was seen at 1130. Per pt, c/o gradual onset and worsening of persistent generalized weakness/fatigue for the past 1 week. Has been associated with right sided low back "pain."  Low back pain worsens with palpation of the area and body position changes. Pt states she was evaluated in the ED last week for same, and was told her "Hgb was low." Pt states she had her labs rechecked at her HD appointment Saturday, was called today and told her "Hgb was 5," and to go to the ED for admission. Pt has been regularly going to her HD appts Tu/Th/Sat.  Denies abd pain, no N/V/D, no fevers, no CP/palpitations, no SOB/cough, no injury, no focal motor weakness, no tingling/numbness in extremities, no saddle anesthesia.    Past Medical History  Diagnosis Date  . HTN (hypertension)     not well controlled  . 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 History of renal transplant     Hx of pancreas-kidney transplant in 2010, started back on dialysis in Oct 2013. Back on insulin also due to failure of pancreatic graft.    . 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 ESRD on dialysis     "[facility]; TTS; (01/27/2013)  . History of blood transfusion     "lots of times" (01/27/2013)  . CAP (community acquired pneumonia) 2010  . Type II diabetes mellitus   . Anemia   . Ovarian tumor, malignant 2005    resected in [place]' pt denies that this was cancer on 01/27/2013  . Diabetic retinopathy   . Hemodialysis patient    Past Surgical History  Procedure Laterality Date  . Tumor removal  2005    Ovarian, resected in [place]  . Av fistula placement  06/03/2012    Procedure: INSERTION OF  ARTERIOVENOUS (AV) GORE-TEX GRAFT ARM;  Surgeon: Rosetta Posner, MD;  Location: [facility];  Service: Vascular;  Laterality: Left;  [name] Eye surgery Right     "cause of diabetes" (4/23/20140  . Combined kidney-pancreas transplant  2010    /notes 01/27/2013; @ [facility]  . 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 Renal biopsy  12/13/2010; 10/25/2011    Archie Endo 01/27/2013   Family History  Problem Relation Age of Onset  . Diabetes Mother   . Liver cancer Father   . Cancer Father    History  Substance Use Topics  . Smoking status: Never Smoker   . Smokeless tobacco: Never Used  . Alcohol Use: No    Review of Systems ROS: Statement: All systems negative except as marked or noted in the HPI; Constitutional: Negative for fever and chills. +generalized weakness/fatigue. ; ; Eyes: Negative for eye pain, redness and discharge. ; ; ENMT: Negative for ear pain, hoarseness, nasal congestion, sinus pressure and sore throat. ; ; Cardiovascular: Negative for chest pain, palpitations, diaphoresis, dyspnea and peripheral edema. ; ; Respiratory: Negative for cough, wheezing and stridor. ; ; Gastrointestinal: Negative for nausea, vomiting, diarrhea, abdominal pain, blood in stool, hematemesis, jaundice and rectal  bleeding. . ; ; Genitourinary: Negative for dysuria, flank pain and hematuria. ; ; Musculoskeletal: +LBP. Negative for neck pain. Negative for swelling and trauma.; ; Skin: Negative for pruritus, rash, abrasions, blisters, bruising and skin lesion.; ; Neuro: Negative for headache, lightheadedness and neck stiffness. Negative for altered level of consciousness , altered mental status, extremity weakness, paresthesias, involuntary movement, seizure and syncope.      Allergies  Ace inhibitors  Home Medications   Prior to Admission medications   Medication Sig Start Date End Date Taking? Authorizing Provider  acetaminophen (TYLENOL) 500 MG tablet  Take 1,000 mg by mouth every 6 (six) hours as needed for moderate pain.   Yes Historical Provider, MD  amLODipine (NORVASC) 10 MG tablet Take 10 mg by mouth at bedtime.    Yes Historical Provider, MD  aspirin EC 81 MG tablet Take 81 mg by mouth at bedtime.    Yes Historical Provider, MD  calcium acetate (PHOSLO) 667 MG capsule Take 3 capsules (2,001 mg total) by mouth 3 (three) times daily with meals. 01/28/13  Yes Sheffield Slider, PA-C  cloNIDine (CATAPRES) 0.1 MG tablet Take 0.1 mg by mouth 2 (two) times daily.   Yes Historical Provider, MD  diphenhydrAMINE (BENADRYL) 25 mg capsule Take 25 mg by mouth daily as needed for allergies.   Yes Historical Provider, MD  escitalopram (LEXAPRO) 20 MG tablet Take 10 mg by mouth daily.    Yes Historical Provider, MD  insulin aspart (NOVOLOG) 100 UNIT/ML injection Inject 12 Units into the skin 3 (three) times daily with meals.   Yes Historical Provider, MD  insulin glargine (LANTUS) 100 UNIT/ML injection Inject 63 Units into the skin at bedtime.    Yes Historical Provider, MD  labetalol (NORMODYNE) 300 MG tablet Take 300 mg by mouth 2 (two) times daily.   Yes Historical Provider, MD  metoCLOPramide (REGLAN) 5 MG tablet Take 5 mg by mouth 4 (four) times daily.   Yes Historical Provider, MD  multivitamin (RENA-VIT) TABS tablet Take 1 tablet by mouth daily.   Yes Historical Provider, MD  Probiotic Product (PROBIOTIC DAILY) CAPS Take 1 capsule by mouth daily.   Yes Historical Provider, MD  simvastatin (ZOCOR) 20 MG tablet Take 20 mg by mouth every Monday, Wednesday, and Friday.   Yes Historical Provider, MD   BP 149/82  Pulse 96  Temp(Src) 98.2 F (36.8 C)  Resp 22  Ht 5\' 7"  (1.702 m)  Wt 176 lb 5.9 oz (80 kg)  BMI 27.62 kg/m2  SpO2 100% Physical Exam 1135: Physical examination:  Nursing notes reviewed; Vital signs and O2 SAT reviewed;  Constitutional: Well developed, Well nourished, Well hydrated, In no acute distress; Head:  Normocephalic, atraumatic;  Eyes: EOMI, PERRL, No scleral icterus; ENMT: Mouth and pharynx normal, Mucous membranes moist; Neck: Supple, Full range of motion, No lymphadenopathy; Cardiovascular: Regular rate and rhythm, No gallop; Respiratory: Breath sounds clear & equal bilaterally, No wheezes.  Speaking full sentences with ease, Normal respiratory effort/excursion; Chest: Nontender, Movement normal; Abdomen: Soft, Nontender, Nondistended, Normal bowel sounds; Genitourinary: No CVA tenderness; Spine:  No midline CS, TS, LS tenderness. +TTP right lumbar paraspinal muscles. No rash.;; Extremities: Pulses normal, No tenderness, No edema, No calf edema or asymmetry.; Neuro: AA&Ox3, Major CN grossly intact.  Speech clear. No gross focal motor or sensory deficits in extremities.; Skin: Color pale, Warm, Dry.    ED Course  Procedures     EKG Interpretation None         MDM  MDM Reviewed: previous chart, nursing note and vitals Reviewed previous: labs and ECG Interpretation: labs, ECG, x-ray and CT scan Total time providing critical care: 30-74 minutes. This excludes time spent performing separately reportable procedures and services. Consults: admitting MD (Renal MD)   CRITICAL CARE Performed by: Alfonzo Feller Total critical care time: 79 Critical care time was exclusive of separately billable procedures and treating other patients. Critical care was necessary to treat or prevent imminent or life-threatening deterioration. Critical care was time spent personally by me on the following activities: development of treatment plan with patient and/or surrogate as well as nursing, discussions with consultants, evaluation of patient's response to treatment, examination of patient, obtaining history from patient or surrogate, ordering and performing treatments and interventions, ordering and review of laboratory studies, ordering and review of radiographic studies, pulse oximetry and re-evaluation of patient's  condition.    Date: 01/24/2014  Rate: 93  Rhythm: normal sinus rhythm  QRS Axis: normal  Intervals: QT prolonged  ST/T Wave abnormalities: nonspecific ST/T changes  Conduction Disutrbances:none  Narrative Interpretation:   Old EKG Reviewed: unchanged; no significant changes compared with EKG dated 01/18/2014.    Results for orders placed during the hospital encounter of 01/24/14  CBC WITH DIFFERENTIAL      Result Value Ref Range   WBC 12.1 (*) 4.0 - 10.5 K/uL   RBC 1.92 (*) 3.87 - 5.11 MIL/uL   Hemoglobin 5.8 (*) 12.0 - 15.0 g/dL   HCT 17.9 (*) 36.0 - 46.0 %   MCV 93.2  78.0 - 100.0 fL   MCH 30.2  26.0 - 34.0 pg   MCHC 32.4  30.0 - 36.0 g/dL   RDW 17.3 (*) 11.5 - 15.5 %   Platelets 308  150 - 400 K/uL   Neutrophils Relative % 88 (*) 43 - 77 %   Neutro Abs 10.7 (*) 1.7 - 7.7 K/uL   Lymphocytes Relative 7 (*) 12 - 46 %   Lymphs Abs 0.9  0.7 - 4.0 K/uL   Monocytes Relative 3  3 - 12 %   Monocytes Absolute 0.4  0.1 - 1.0 K/uL   Eosinophils Relative 2  0 - 5 %   Eosinophils Absolute 0.2  0.0 - 0.7 K/uL   Basophils Relative 0  0 - 1 %   Basophils Absolute 0.0  0.0 - 0.1 K/uL  TROPONIN I      Result Value Ref Range   Troponin I <0.30  <0.30 ng/mL  BASIC METABOLIC PANEL      Result Value Ref Range   Sodium 132 (*) 137 - 147 mEq/L   Potassium 5.6 (*) 3.7 - 5.3 mEq/L   Chloride 90 (*) 96 - 112 mEq/L   CO2 21  19 - 32 mEq/L   Glucose, Bld 203 (*) 70 - 99 mg/dL   BUN 58 (*) 6 - 23 mg/dL   Creatinine, Ser 8.05 (*) 0.50 - 1.10 mg/dL   Calcium 9.4  8.4 - 10.5 mg/dL   GFR calc non Af Amer 6 (*) >90 mL/min   GFR calc Af Amer 7 (*) >90 mL/min  HCG, SERUM, QUALITATIVE      Result Value Ref Range   Preg, Serum NEGATIVE  NEGATIVE  TYPE AND SCREEN      Result Value Ref Range   ABO/RH(D) AB POS     Antibody Screen NEG     Sample Expiration 01/27/2014     Unit Number [id number]     Blood Component Type RED  CELLS,LR     Unit division 00     Status of Unit ALLOCATED      Transfusion Status OK TO TRANSFUSE     Crossmatch Result Compatible     Unit Number [id number]     Blood Component Type RED CELLS,LR     Unit division 00     Status of Unit ALLOCATED     Transfusion Status OK TO TRANSFUSE     Crossmatch Result Compatible    PREPARE RBC (CROSSMATCH)      Result Value Ref Range   Order Confirmation ORDER PROCESSED BY BLOOD BANK     Dg Chest 2 View 01/24/2014   CLINICAL DATA:  Flank pain. History of dialysis, hypertension and diabetes.  EXAM: CHEST  2 VIEW  COMPARISON:  DG CHEST 1V dated 10/01/2013; DG CHEST 2 VIEW dated 08/25/2013  FINDINGS: Cardiomegaly and chronic central airway thickening are stable. The previously demonstrated bilateral airspace opacities have partially cleared. However, there are residual or recurrent perihilar and lower lobe airspace opacities bilaterally associated with diffuse fissural thickening. There is no significant pleural effusion.  IMPRESSION: Bilateral perihilar and lower lobe airspace opacities, likely representing recurrent edema or inflammation. The findings are not as severe as on the most recent study of 4 months ago.   Electronically Signed   By: Camie Patience M.D.   On: 01/24/2014 12:59   Ct Abdomen Pelvis Wo Contrast 01/24/2014   CLINICAL DATA:  Right-sided flank pain  EXAM: CT ABDOMEN AND PELVIS WITHOUT CONTRAST  TECHNIQUE: Multidetector CT imaging of the abdomen and pelvis was performed following the standard protocol without intravenous contrast.  COMPARISON:  None.  FINDINGS: Lung bases show evidence of small pleural effusions right greater than left. Additionally patchy infiltrates are noted similar to that seen on the recent chest x-ray.  The liver, gallbladder, spleen, adrenal glands and pancreas are within normal limits 06/10/2012 The native kidneys are shrunken similar to that seen on the prior exam. There is a renal transplant in the right abdomen/iliac fossa. When compared with the prior exam however, there is loss  of the renal sinus fat as well as diffuse multifocal calcifications throughout the renal transplant. These changes are consistent with a failed, chronically rejected transplant. There are changes in the small bowel consistent with the pancreatic portion of the transplant although the definitive transplanted pancreas is not well appreciated on this exam.  The bladder is partially distended. Ovarian cystic changes are seen particularly on the left. No significant free pelvic fluid is noted. Scattered small retroperitoneal lymph nodes are again identified and stable from the prior exam. These may be related to transplant rejection. No acute bony abnormality is seen.  IMPRESSION: Bibasilar infiltrative changes similar to that seen on recent chest x-ray. Bilateral pleural effusions right greater than left.  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  No other acute abnormality is noted   Electronically Signed   By: Inez Catalina M.D.   On: 01/24/2014 16:31    1330:  H/H lower than previous; will start transfusion. VS remain stable.  Dx and testing d/w pt.  Questions answered.  Verb understanding, agreeable to admit.   T/C to [facility]. Tyrell Antonio, case discussed, including:  HPI, pertinent PM/SHx, VS/PE, dx testing, ED course and treatment:  Agreeable to admit, requests to consult Renal MD, write temporary orders, obtain inpt medical bed to team 10. T/C to Renal Dr. Marval Regal, case discussed,  including:  HPI, pertinent PM/SHx, VS/PE, dx testing, ED course and treatment:  Agreeable to consult.      Alfonzo Feller, DO 01/26/14 [phone]

## 2014-01-25 ENCOUNTER — Encounter (HOSPITAL_COMMUNITY): Payer: Self-pay | Admitting: General Practice

## 2014-01-25 ENCOUNTER — Inpatient Hospital Stay (HOSPITAL_COMMUNITY): Payer: Medicare Other

## 2014-01-25 DIAGNOSIS — Z940 Kidney transplant status: Secondary | ICD-10-CM

## 2014-01-25 DIAGNOSIS — I10 Essential (primary) hypertension: Secondary | ICD-10-CM

## 2014-01-25 LAB — GLUCOSE, CAPILLARY
GLUCOSE-CAPILLARY: 243 mg/dL — AB (ref 70–99)
Glucose-Capillary: 139 mg/dL — ABNORMAL HIGH (ref 70–99)
Glucose-Capillary: 262 mg/dL — ABNORMAL HIGH (ref 70–99)
Glucose-Capillary: 316 mg/dL — ABNORMAL HIGH (ref 70–99)

## 2014-01-25 LAB — TYPE AND SCREEN
ABO/RH(D): AB POS
Antibody Screen: NEGATIVE
UNIT DIVISION: 0
UNIT DIVISION: 0
Unit division: 0

## 2014-01-25 LAB — BASIC METABOLIC PANEL
BUN: 22 mg/dL (ref 6–23)
CHLORIDE: 91 meq/L — AB (ref 96–112)
CO2: 24 mEq/L (ref 19–32)
Calcium: 9.6 mg/dL (ref 8.4–10.5)
Creatinine, Ser: 4 mg/dL — ABNORMAL HIGH (ref 0.50–1.10)
GFR calc Af Amer: 15 mL/min — ABNORMAL LOW (ref >90)
GFR, EST NON AFRICAN AMERICAN: 13 mL/min — AB (ref >90)
Glucose, Bld: 310 mg/dL — ABNORMAL HIGH (ref 70–99)
POTASSIUM: 3.8 meq/L (ref 3.7–5.3)
Sodium: 134 mEq/L — ABNORMAL LOW (ref 137–147)

## 2014-01-25 LAB — CBC
HCT: 26.6 % — ABNORMAL LOW (ref 36.0–46.0)
Hemoglobin: 8.6 g/dL — ABNORMAL LOW (ref 12.0–15.0)
MCH: 29.6 pg (ref 26.0–34.0)
MCHC: 32.3 g/dL (ref 30.0–36.0)
MCV: 91.4 fL (ref 78.0–100.0)
Platelets: 321 10*3/uL (ref 150–400)
RBC: 2.91 MIL/uL — ABNORMAL LOW (ref 3.87–5.11)
RDW: 17.8 % — AB (ref 11.5–15.5)
WBC: 9.2 10*3/uL (ref 4.0–10.5)

## 2014-01-25 LAB — MRSA PCR SCREENING: MRSA by PCR: NEGATIVE

## 2014-01-25 MED ORDER — DOXERCALCIFEROL 4 MCG/2ML IV SOLN
2.0000 ug | INTRAVENOUS | Status: DC
  Administered 2014-01-25: 2 ug via INTRAVENOUS

## 2014-01-25 MED ORDER — BIOTENE DRY MOUTH MT LIQD
15.0000 mL | Freq: Two times a day (BID) | OROMUCOSAL | Status: DC
  Administered 2014-01-25: 15 mL via OROMUCOSAL

## 2014-01-25 MED ORDER — DOXERCALCIFEROL 4 MCG/2ML IV SOLN
INTRAVENOUS | Status: AC
  Filled 2014-01-25: qty 2

## 2014-01-25 MED ORDER — HYDROCODONE-ACETAMINOPHEN 5-325 MG PO TABS: 1.0000 | ORAL_TABLET | ORAL | Status: DC | PRN

## 2014-01-25 MED ORDER — SODIUM CHLORIDE 0.9 % IV SOLN
125.0000 mg | INTRAVENOUS | Status: DC
  Administered 2014-01-25: 125 mg via INTRAVENOUS
  Filled 2014-01-25: qty 10

## 2014-01-25 NOTE — Procedures (Signed)
I was present at this dialysis session, have reviewed the session itself and made  appropriate changes  Kelly Splinter MD (pgr) [phone]    [phone] 01/25/2014, 12:42 PM

## 2014-01-25 NOTE — Discharge Summary (Signed)
Physician Discharge Summary  [name] [id number] DOB: Nov 29, 1975 DOA: 01/24/2014  PCP: Tommy Medal, MD  Admit date: 01/24/2014 Discharge date: 01/25/2014  Time spent: <30 minutes  Recommendations for Outpatient Follow-up:  Follow-up Information   Follow up with Tommy Medal, MD. (This week, call for appointment upon discharge )    Specialty:  Internal Medicine   Contact information:   [address], [place] [address] [phone]       Please follow up. (Dialysis as directed per renal)        Discharge Diagnoses:  Active Problems:   Anemia   DM (diabetes mellitus)   Symptomatic anemia   Discharge Condition: improved/stable  Diet recommendation:renal  Filed Weights   01/24/14 2130 01/25/14 1050 01/25/14 1430  Weight: 75.8 kg (167 lb 1.7 oz) 76.7 kg (169 lb 1.5 oz) 73.9 kg (162 lb 14.7 oz)    History of present illness:  Pt is a 38 y.o. female with PMH significant for ESRD dialysis T, Thu, Sat, history of anemia prior work up done at [facility], anemia thought to be anemia of chronic diseases, on Home oxygen 5 L unclear diagnosis, history of pancreas transplant who presents to ED complaining of right side flank pain. She was also refer to ED because of abnormal lab work. She was called by dialysis unit today, she was told her Hb was low. In the ED patient was found to have Hb at 5.9, chest x ray with bilateral pulmonary edema vs infiltrates. 2 units of PRBC ordered for transfusion. Renal was consulted on admission.  Patient denied cough, fever, abdominal pain, constipation, diarrhea. No melena, hematochezia.      Hospital Course:  1-Anemia; symptomatic. 2 units of PRBC for transfusion ordered. She has had prior GI and hematology evaluation at [facility]. She last follow up with her hematologist last month. She was told she doesn't need to follow up with then any more. -she was transfused 2U prbc and her hgb improved to 8.6 on recheck today -she is to  followup with her PCP  2-ESRD: Chest x ray on admission showed bilateral pulmonary edema vs infiltrates. She is home oxygen. Renal was consulted and pt wwas dialyyzed. Follow up CXR today after dialysis with improved edema and effusion per Renal OK to dc this PM 3-Respiratory failure, chronic; unclear etiology, on home oxygen 5 L. Chest x ray bilateral perihilar and lower lobe airspace opacities, likely representing recurrent edema or inflammation. No fevers.  Antibiotics were held off. Repeat chest x ray after dialysis improved as discussed above>> more c/w with pulm edema. 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  No other acute abnormality is noted -it is noted that she has prior h/o ovarian cancer, she is to follow up with her PCP for further eval and management of this pain. Pt insisted she had to go home today d/t childcare issues. She is been dc'ed on oral pain meds for further outpt work up.       Procedures:  NONE  Consultations:  RENAL  Discharge Exam: Filed Vitals:   01/25/14 1553  BP: 155/74  Pulse: 88  Temp: 98.6 F (37 C)  Resp: 18    Exam:  General: alert & oriented x3 In NAD. Pt seen during dialysis Cardiovascular: RRR, nl S1 s2 Respiratory: decreased BS at bases with few  basilar crackles Abdomen: soft +BS NT/ND, no masses palpable Extremities: No cyanosis and no edema    Discharge Instructions You were cared for by a hospitalist during your hospital stay. If you have any questions about your discharge medications or the care you received while you were in the hospital after you are discharged, you can call the unit and asked to speak with the hospitalist on call if the hospitalist that took care of you is not available. Once you are discharged, your primary care physician  will handle any further medical issues. Please note that NO REFILLS for any discharge medications will be authorized once you are discharged, as it is imperative that you return to your primary care physician (or establish a relationship with a primary care physician if you do not have one) for your aftercare needs so that they can reassess your need for medications and monitor your lab values.  Discharge Orders   Future Orders Complete By Expires   Diet renal [id number]  As directed    Increase activity slowly  As directed        Medication List         acetaminophen 500 MG tablet  Commonly known as:  TYLENOL  Take 1,000 mg by mouth every 6 (six) hours as needed for moderate pain.     amLODipine 10 MG tablet  Commonly known as:  NORVASC  Take 10 mg by mouth at bedtime.     aspirin EC 81 MG tablet  Take 81 mg by mouth at bedtime.     calcium acetate 667 MG capsule  Commonly known as:  PHOSLO  Take 3 capsules (2,001 mg total) by mouth 3 (three) times daily with meals.     cloNIDine 0.1 MG tablet  Commonly known as:  CATAPRES  Take 0.1 mg by mouth 2 (two) times daily.     diphenhydrAMINE 25 mg capsule  Commonly known as:  BENADRYL  Take 25 mg by mouth daily as needed for allergies.     escitalopram 20 MG tablet  Commonly known as:  LEXAPRO  Take 10 mg by mouth daily.     HYDROcodone-acetaminophen 5-325 MG per tablet  Commonly known as:  NORCO/VICODIN  Take 1-2 tablets by mouth every 4 (four) hours as needed for moderate pain.     insulin aspart 100 UNIT/ML injection  Commonly known as:  novoLOG  Inject 12 Units into the skin 3 (three) times daily with meals.     insulin glargine 100 UNIT/ML injection  Commonly known as:  LANTUS  Inject 63 Units into the skin at bedtime.     labetalol 300 MG tablet  Commonly known as:  NORMODYNE  Take 300 mg by mouth 2 (two) times daily.     metoCLOPramide 5 MG tablet  Commonly known as:  REGLAN  Take 5 mg by mouth 4 (four)  times daily.     multivitamin Tabs tablet  Take 1 tablet by mouth daily.     PROBIOTIC DAILY Caps  Take 1 capsule by mouth daily.     simvastatin 20 MG tablet  Commonly known as:  ZOCOR  Take 20 mg by mouth every Monday, Wednesday, and Friday.       Allergies  Allergen Reactions  . Ace Inhibitors Cough      The results of significant diagnostics from this hospitalization (including imaging, microbiology, ancillary and laboratory) are listed below for reference.    Significant Diagnostic Studies: Ct Abdomen Pelvis Wo Contrast  01/24/2014  CLINICAL DATA:  Right-sided flank pain  EXAM: CT ABDOMEN AND PELVIS WITHOUT CONTRAST  TECHNIQUE: Multidetector CT imaging of the abdomen and pelvis was performed following the standard protocol without intravenous contrast.  COMPARISON:  None.  FINDINGS: Lung bases show evidence of small pleural effusions right greater than left. Additionally patchy infiltrates are noted similar to that seen on the recent chest x-ray.  The liver, gallbladder, spleen, adrenal glands and pancreas are within normal limits 06/10/2012 The native kidneys are shrunken similar to that seen on the prior exam. There is a renal transplant in the right abdomen/iliac fossa. When compared with the prior exam however, there is loss of the renal sinus fat as well as diffuse multifocal calcifications throughout the renal transplant. These changes are consistent with a failed, chronically rejected transplant. There are changes in the small bowel consistent with the pancreatic portion of the transplant although the definitive transplanted pancreas is not well appreciated on this exam.  The bladder is partially distended. Ovarian cystic changes are seen particularly on the left. No significant free pelvic fluid is noted. Scattered small retroperitoneal lymph nodes are again identified and stable from the prior exam. These may be related to transplant rejection. No acute bony abnormality is  seen.  IMPRESSION: Bibasilar infiltrative changes similar to that seen on recent chest x-ray. Bilateral pleural effusions right greater than left.  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  No other acute abnormality is noted   Electronically Signed   By: Inez Catalina M.D.   On: 01/24/2014 16:31   Dg Chest 2 View  01/25/2014   CLINICAL DATA:  Shortness of breath.  Dialysis patient.  EXAM: CHEST  2 VIEW  COMPARISON:  CT and PA and lateral chest 01/24/2014.  FINDINGS: There is cardiomegaly. Mild interstitial edema is identified but appears improved since yesterday's examination. Small pleural effusions are noted. No pneumothorax.  IMPRESSION: Improved pulmonary edema and pleural effusions.   Electronically Signed   By: Inge Rise M.D.   On: 01/25/2014 18:12   Dg Chest 2 View  01/24/2014   CLINICAL DATA:  Flank pain. History of dialysis, hypertension and diabetes.  EXAM: CHEST  2 VIEW  COMPARISON:  DG CHEST 1V dated 10/01/2013; DG CHEST 2 VIEW dated 08/25/2013  FINDINGS: Cardiomegaly and chronic central airway thickening are stable. The previously demonstrated bilateral airspace opacities have partially cleared. However, there are residual or recurrent perihilar and lower lobe airspace opacities bilaterally associated with diffuse fissural thickening. There is no significant pleural effusion.  IMPRESSION: Bilateral perihilar and lower lobe airspace opacities, likely representing recurrent edema or inflammation. The findings are not as severe as on the most recent study of 4 months ago.   Electronically Signed   By: Camie Patience M.D.   On: 01/24/2014 12:59    Microbiology: Recent Results (from the past 240 hour(s))  MRSA PCR SCREENING     Status: None   Collection Time    01/25/14  7:10 AM      Result Value Ref Range Status   MRSA by PCR NEGATIVE  NEGATIVE Final   Comment:            The GeneXpert MRSA Assay (FDA      approved for NASAL specimens     only), is one component of a     comprehensive MRSA colonization     surveillance program. It is not     intended  to diagnose MRSA     infection nor to guide or     monitor treatment for     MRSA infections.     Labs: Basic Metabolic Panel:  Recent Labs Lab 01/24/14 1200 01/25/14 0426  NA 132* 134*  K 5.6* 3.8  CL 90* 91*  CO2 21 24  GLUCOSE 203* 310*  BUN 58* 22  CREATININE 8.05* 4.00*  CALCIUM 9.4 9.6   Liver Function Tests: No results found for this basename: AST, ALT, ALKPHOS, BILITOT, PROT, ALBUMIN,  in the last 168 hours No results found for this basename: LIPASE, AMYLASE,  in the last 168 hours No results found for this basename: AMMONIA,  in the last 168 hours CBC:  Recent Labs Lab 01/24/14 1200 01/25/14 0426  WBC 12.1* 9.2  NEUTROABS 10.7*  --   HGB 5.8* 8.6*  HCT 17.9* 26.6*  MCV 93.2 91.4  PLT 308 321   Cardiac Enzymes:  Recent Labs Lab 01/24/14 1144  TROPONINI <0.30   BNP: BNP (last 3 results)  Recent Labs  02/01/13 2344  PROBNP 58488.0*   CBG:  Recent Labs Lab 01/24/14 1656 01/25/14 0017 01/25/14 0800 01/25/14 1552 01/25/14 1655  GLUCAP 141* 316* 262* 139* 243*       Signed:  Rever Pichette C Jason Frisbee  [facility] 01/25/2014, 7:08 PM

## 2014-01-25 NOTE — Progress Notes (Addendum)
[facility] Progress Note  Subjective:   Still complains of rt flank pain. Wants to go home today due to childcare issues.  Objective Filed Vitals:   01/24/14 2100 01/24/14 2130 01/24/14 2201 01/25/14 0500  BP: 149/67 161/81 150/66 124/63  Pulse: 90 91 107 85  Temp:  98.2 F (36.8 C) 98 F (36.7 C) 98.5 F (36.9 C)  TempSrc:  Oral Oral Oral  Resp:  20 18 18   Height:      Weight:  75.8 kg (167 lb 1.7 oz)    SpO2:  98% 100% 100%   Physical Exam General: Alert, cooperative, NAD, some facial edema Heart: RRR, no murmur or rub Lungs: Bibasilar crackles R > L. No wheezes, rales or rhonchi Abdomen: Soft, Rt flank mildly tender. + BS  Extremities: No LE edema Dialysis Access: L AVG + bruit  Dialysis: TTS AF  4h 71kg 2/2.25 Bath L arm AVG Heparin 2400  Hectorol 2 Epo 20,000 Venofer 100 q hd x 3 more   Assessment/Plan: 1. Cough / DOE / pulm infiltrates- suspect volume overload, can't r/o PNA as well but would prefer to treat as vol overload first to see if there is resolution of CXR findings. CT abd/pelvis notable for bilat pleural effusions Would not discharge until CXR findings are clearing up or another cause if identified 2. R flank pain -  ? If Rt pleural effusion contributing Doubtful, the effusions are small and simple 3. ESRD - TTS, K+ 3.8, HD again today for volume excess and to get back on schedule 4. Hypertension/ Volume - SBPs 120s on BB, norvasc, clonidine. Net UF ~4.2L with HD yesterday. Crackles on exam, bilat pleural effusions on CT. Still up ~4.8 kg by wgts. HD today 5. Anemia - Hgb 8.6 s/p transfusion of 2 units PRBCs 4/20. Aranesp 100 dosed on 4/20. Fe load in progress - 2 doses remain.  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 Phos poorly controlled outpatient. Cont vit D, binders  7. Hx prior pancreas and renal transplant- is not on any immunosuppressive medication  8. DM2  9. Hyperkalemia - resolved with HD yesterday  Collene Leyden. Cletus Gash, PA-C [facility] Pager [phone] 01/25/2014,10:48 AM  LOS: 1 day   Pt seen, examined, agree w assess/plan as above with additions as indicated. Pt has poor compliance problems and is here w recurrent issues of SOB and pulm infiltrates which we assume are due to edema but are not completely sure. Have ordered pa/lat CXR for after HD today and if it is clear or mostly clearing then ok for discharge. She has young children at home and needs to be back home tomorrow, preferably tonight if possible, she says.  Kelly Splinter MD pager [phone]    cell [phone] 01/25/2014, 12:40 PM     Additional Objective Labs: Basic Metabolic Panel:  Recent Labs Lab 01/18/14 1428 01/24/14 1200 01/25/14 0426  NA 134* 132* 134*  K 5.1 5.6* 3.8  CL 91* 90* 91*  CO2 27 21 24   GLUCOSE 225* 203* 310*  BUN 27* 58* 22  CREATININE 5.11* 8.05* 4.00*  CALCIUM 8.8 9.4 9.6   CBC:  Recent Labs Lab 01/18/14 1428 01/24/14 1200 01/25/14 0426  WBC 8.7 12.1* 9.2  NEUTROABS 7.3 10.7*  --   HGB 7.4* 5.8* 8.6*  HCT 21.9* 17.9* 26.6*  MCV 89.4 93.2 91.4  PLT 364 308 321   Blood Culture    Component Value Date/Time   SDES BLOOD HAND RIGHT 04/26/2013 1425  SPECREQUEST BOTTLES DRAWN AEROBIC ONLY 2CC 04/26/2013 1425   CULT NO GROWTH 5 DAYS 04/26/2013 1425   REPTSTATUS 05/03/2013 FINAL 04/26/2013 1425    Cardiac Enzymes:  Recent Labs Lab 01/24/14 1144  TROPONINI <0.30   CBG:  Recent Labs Lab 01/18/14 1321 01/24/14 1656 01/25/14 0017  GLUCAP 303* 141* 316*    Studies/Results: Ct Abdomen Pelvis Wo Contrast  01/24/2014   CLINICAL DATA:  Right-sided flank pain  EXAM: CT ABDOMEN AND PELVIS WITHOUT CONTRAST  TECHNIQUE: Multidetector CT imaging of the abdomen and pelvis was performed following the standard protocol without intravenous contrast.  COMPARISON:  None.  FINDINGS: Lung bases show evidence of small pleural effusions right greater than left. Additionally patchy infiltrates are noted similar to that  seen on the recent chest x-ray.  The liver, gallbladder, spleen, adrenal glands and pancreas are within normal limits 06/10/2012 The native kidneys are shrunken similar to that seen on the prior exam. There is a renal transplant in the right abdomen/iliac fossa. When compared with the prior exam however, there is loss of the renal sinus fat as well as diffuse multifocal calcifications throughout the renal transplant. These changes are consistent with a failed, chronically rejected transplant. There are changes in the small bowel consistent with the pancreatic portion of the transplant although the definitive transplanted pancreas is not well appreciated on this exam.  The bladder is partially distended. Ovarian cystic changes are seen particularly on the left. No significant free pelvic fluid is noted. Scattered small retroperitoneal lymph nodes are again identified and stable from the prior exam. These may be related to transplant rejection. No acute bony abnormality is seen.  IMPRESSION: Bibasilar infiltrative changes similar to that seen on recent chest x-ray. Bilateral pleural effusions right greater than left.  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  No other acute abnormality is noted   Electronically Signed   By: Inez Catalina M.D.   On: 01/24/2014 16:31   Dg Chest 2 View  01/24/2014   CLINICAL DATA:  Flank pain. History of dialysis, hypertension and diabetes.  EXAM: CHEST  2 VIEW  COMPARISON:  DG CHEST 1V dated 10/01/2013; DG CHEST 2 VIEW dated 08/25/2013  FINDINGS: Cardiomegaly and chronic central airway thickening are stable. The previously demonstrated bilateral airspace opacities have partially cleared. However, there are residual or recurrent perihilar and lower lobe airspace opacities bilaterally associated with diffuse fissural thickening. There is no significant pleural effusion.  IMPRESSION: Bilateral perihilar  and lower lobe airspace opacities, likely representing recurrent edema or inflammation. The findings are not as severe as on the most recent study of 4 months ago.   Electronically Signed   By: Camie Patience M.D.   On: 01/24/2014 12:59   Medications:   . amLODipine  10 mg Oral QHS  . antiseptic oral rinse  15 mL Mouth Rinse BID  . aspirin EC  81 mg Oral QHS  . calcium acetate  2,001 mg Oral TID WC  . cloNIDine  0.1 mg Oral BID  . darbepoetin  100 mcg Intravenous Q Mon-HD  . [START ON 01/26/2014] doxercalciferol  2 mcg Intravenous Q M,W,F-HD  . escitalopram  10 mg Oral Daily  . [START ON 01/26/2014] ferric gluconate (FERRLECIT/NULECIT) IV  125 mg Intravenous Q M,W,F-HD  . heparin  2,400 Units Dialysis Once in dialysis  . heparin  2,400 Units Dialysis Once in dialysis  . heparin  5,000 Units Subcutaneous 3  times per day  . insulin aspart  0-9 Units Subcutaneous TID WC  . insulin aspart  12 Units Subcutaneous TID WC  . insulin glargine  40 Units Subcutaneous QHS  . labetalol  300 mg Oral BID  . metoCLOPramide  5 mg Oral TID AC & HS  . multivitamin  1 tablet Oral QHS  . saccharomyces boulardii  250 mg Oral Daily  . simvastatin  20 mg Oral Q M,W,F-1800  . sodium chloride  3 mL Intravenous Q12H

## 2014-02-26 ENCOUNTER — Encounter (HOSPITAL_COMMUNITY): Payer: Self-pay | Admitting: Emergency Medicine

## 2014-02-26 ENCOUNTER — Emergency Department (HOSPITAL_COMMUNITY)
Admission: EM | Admit: 2014-02-26 | Discharge: 2014-02-26 | Disposition: A | Discharge status: Home / Self Care | Payer: Medicare Other | Source: Home / Self Care | Attending: Emergency Medicine | Admitting: Emergency Medicine

## 2014-02-26 ENCOUNTER — Emergency Department (HOSPITAL_COMMUNITY): Payer: Medicare Other

## 2014-02-26 DIAGNOSIS — Z7982 Long term (current) use of aspirin: Secondary | ICD-10-CM

## 2014-02-26 DIAGNOSIS — Z8543 Personal history of malignant neoplasm of ovary: Secondary | ICD-10-CM | POA: Insufficient documentation

## 2014-02-26 DIAGNOSIS — Z794 Long term (current) use of insulin: Secondary | ICD-10-CM

## 2014-02-26 DIAGNOSIS — D649 Anemia, unspecified: Secondary | ICD-10-CM

## 2014-02-26 DIAGNOSIS — Z940 Kidney transplant status: Secondary | ICD-10-CM

## 2014-02-26 DIAGNOSIS — Z8611 Personal history of tuberculosis: Secondary | ICD-10-CM | POA: Insufficient documentation

## 2014-02-26 DIAGNOSIS — Z8701 Personal history of pneumonia (recurrent): Secondary | ICD-10-CM

## 2014-02-26 DIAGNOSIS — E11319 Type 2 diabetes mellitus with unspecified diabetic retinopathy without macular edema: Secondary | ICD-10-CM

## 2014-02-26 DIAGNOSIS — N186 End stage renal disease: Secondary | ICD-10-CM | POA: Insufficient documentation

## 2014-02-26 DIAGNOSIS — E1139 Type 2 diabetes mellitus with other diabetic ophthalmic complication: Secondary | ICD-10-CM

## 2014-02-26 DIAGNOSIS — R0602 Shortness of breath: Secondary | ICD-10-CM | POA: Diagnosis not present

## 2014-02-26 DIAGNOSIS — E119 Type 2 diabetes mellitus without complications: Secondary | ICD-10-CM

## 2014-02-26 DIAGNOSIS — Z9981 Dependence on supplemental oxygen: Secondary | ICD-10-CM

## 2014-02-26 DIAGNOSIS — Z9483 Pancreas transplant status: Secondary | ICD-10-CM

## 2014-02-26 DIAGNOSIS — I120 Hypertensive chronic kidney disease with stage 5 chronic kidney disease or end stage renal disease: Secondary | ICD-10-CM

## 2014-02-26 DIAGNOSIS — Z992 Dependence on renal dialysis: Secondary | ICD-10-CM

## 2014-02-26 LAB — CBC WITH DIFFERENTIAL/PLATELET
Basophils Absolute: 0 10*3/uL (ref 0.0–0.1)
Basophils Relative: 0 % (ref 0–1)
Eosinophils Absolute: 0.1 10*3/uL (ref 0.0–0.7)
Eosinophils Relative: 2 % (ref 0–5)
HCT: 21.1 % — ABNORMAL LOW (ref 36.0–46.0)
Hemoglobin: 7 g/dL — ABNORMAL LOW (ref 12.0–15.0)
Lymphocytes Relative: 9 % — ABNORMAL LOW (ref 12–46)
Lymphs Abs: 0.7 10*3/uL (ref 0.7–4.0)
MCH: 30 pg (ref 26.0–34.0)
MCHC: 33.2 g/dL (ref 30.0–36.0)
MCV: 90.6 fL (ref 78.0–100.0)
Monocytes Absolute: 0.3 10*3/uL (ref 0.1–1.0)
Monocytes Relative: 3 % (ref 3–12)
Neutro Abs: 7.4 10*3/uL (ref 1.7–7.7)
Neutrophils Relative %: 86 % — ABNORMAL HIGH (ref 43–77)
Platelets: 288 10*3/uL (ref 150–400)
RBC: 2.33 MIL/uL — ABNORMAL LOW (ref 3.87–5.11)
RDW: 15.7 % — ABNORMAL HIGH (ref 11.5–15.5)
WBC: 8.5 10*3/uL (ref 4.0–10.5)

## 2014-02-26 LAB — BASIC METABOLIC PANEL
BUN: 22 mg/dL (ref 6–23)
CO2: 30 mEq/L (ref 19–32)
Calcium: 9.1 mg/dL (ref 8.4–10.5)
Chloride: 90 mEq/L — ABNORMAL LOW (ref 96–112)
Creatinine, Ser: 4.52 mg/dL — ABNORMAL HIGH (ref 0.50–1.10)
GFR calc Af Amer: 13 mL/min — ABNORMAL LOW (ref >90)
GFR calc non Af Amer: 11 mL/min — ABNORMAL LOW (ref >90)
Glucose, Bld: 370 mg/dL — ABNORMAL HIGH (ref 70–99)
Potassium: 4.1 mEq/L (ref 3.7–5.3)
Sodium: 135 mEq/L — ABNORMAL LOW (ref 137–147)

## 2014-02-26 LAB — PREPARE RBC (CROSSMATCH)

## 2014-02-26 MED ORDER — INSULIN ASPART 100 UNIT/ML ~~LOC~~ SOLN
10.0000 [IU] | Freq: Once | SUBCUTANEOUS | Status: AC
  Administered 2014-02-26: 10 [IU] via INTRAVENOUS
  Filled 2014-02-26: qty 1

## 2014-02-26 NOTE — ED Notes (Signed)
Pt only received half of dialysis treatment today.

## 2014-02-26 NOTE — Discharge Instructions (Signed)

## 2014-02-26 NOTE — ED Notes (Signed)
PT returned from Xray 

## 2014-02-26 NOTE — ED Provider Notes (Signed)
CSN: [id number]     Arrival date & time 02/26/14  1142 History   First MD Initiated Contact with Patient 02/26/14 1148     Chief Complaint  Patient presents with  . Abnormal Lab     (Consider location/radiation/quality/duration/timing/severity/associated sxs/prior Treatment) HPI  37yF dyspnea. Hx of ESRD on dialysis. Left session today after ~2hours because she felt too SOB. No fever or chills. No cough. Denies worsening swelling. No BRBPR. No melena. No CP.   Past Medical History  Diagnosis Date  . HTN (hypertension)     not well controlled  . 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 History of renal transplant     Hx of pancreas-kidney transplant in 2010, started back on dialysis in Oct 2013. Back on insulin also due to failure of pancreatic graft.    . 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 ESRD on dialysis     "[facility]; TTS; (01/27/2013)  . History of blood transfusion     "lots of times" (01/27/2013)  . CAP (community acquired pneumonia) 2010  . Type II diabetes mellitus   . Anemia   . Ovarian tumor, malignant 2005    resected in [place]' pt denies that this was cancer on 01/27/2013  . Diabetic retinopathy   . Hemodialysis patient   . On home oxygen therapy     "5L; 24/7" (01/25/2014)   Past Surgical History  Procedure Laterality Date  . Tumor removal  2005    Ovarian, resected in [place]  . Av fistula placement  06/03/2012    Procedure: INSERTION OF ARTERIOVENOUS (AV) GORE-TEX GRAFT ARM;  Surgeon: Rosetta Posner, MD;  Location: [place];  Service: Vascular;  Laterality: Left;  [name] Eye surgery Right     "cause of diabetes" (4/23/20140  . Combined kidney-pancreas transplant  2010    /notes 01/27/2013; @ [facility]  . 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 Renal biopsy  12/13/2010;  10/25/2011    Archie Endo 01/27/2013   Family History  Problem Relation Age of Onset  . Diabetes Mother   . Liver cancer Father   . Cancer Father    History  Substance Use Topics  . Smoking status: Never Smoker   . Smokeless tobacco: Never Used  . Alcohol Use: No   OB History   Grav Para Term Preterm Abortions TAB SAB Ect Mult Living                 Review of Systems  All systems reviewed and negative, other than as noted in HPI.   Allergies  Ace inhibitors  Home Medications   Prior to Admission medications   Medication Sig Start Date End Date Taking? Authorizing Provider  acetaminophen (TYLENOL) 500 MG tablet Take 1,000 mg by mouth every 6 (six) hours as needed for moderate pain.    Historical Provider, MD  amLODipine (NORVASC) 10 MG tablet Take 10 mg by mouth at bedtime.     Historical Provider, MD  aspirin EC 81 MG tablet Take 81 mg by mouth at bedtime.     Historical Provider, MD  calcium acetate (PHOSLO) 667 MG capsule Take 3 capsules (2,001 mg total) by mouth 3 (three) times daily with meals. 01/28/13   Myriam Jacobson, PA-C  cloNIDine (CATAPRES) 0.1 MG tablet Take  0.1 mg by mouth 2 (two) times daily.    Historical Provider, MD  diphenhydrAMINE (BENADRYL) 25 mg capsule Take 25 mg by mouth daily as needed for allergies.    Historical Provider, MD  escitalopram (LEXAPRO) 20 MG tablet Take 10 mg by mouth daily.     Historical Provider, MD  HYDROcodone-acetaminophen (NORCO/VICODIN) 5-325 MG per tablet Take 1-2 tablets by mouth every 4 (four) hours as needed for moderate pain. 01/25/14   Sheila Oats, MD  insulin aspart (NOVOLOG) 100 UNIT/ML injection Inject 12 Units into the skin 3 (three) times daily with meals.    Historical Provider, MD  insulin glargine (LANTUS) 100 UNIT/ML injection Inject 63 Units into the skin at bedtime.     Historical Provider, MD  labetalol (NORMODYNE) 300 MG tablet Take 300 mg by mouth 2 (two) times daily.    Historical Provider, MD   metoCLOPramide (REGLAN) 5 MG tablet Take 5 mg by mouth 4 (four) times daily.    Historical Provider, MD  multivitamin (RENA-VIT) TABS tablet Take 1 tablet by mouth daily.    Historical Provider, MD  Probiotic Product (PROBIOTIC DAILY) CAPS Take 1 capsule by mouth daily.    Historical Provider, MD  simvastatin (ZOCOR) 20 MG tablet Take 20 mg by mouth every Monday, Wednesday, and Friday.    Historical Provider, MD   BP 158/65  Pulse 95  Temp(Src) 97.2 F (36.2 C) (Oral)  Resp 18  SpO2 100% Physical Exam  Nursing note and vitals reviewed. Constitutional: She appears well-developed and well-nourished. No distress.  Laying in bed. NAD.  HENT:  Head: Normocephalic and atraumatic.  Eyes: Conjunctivae are normal. Right eye exhibits no discharge. Left eye exhibits no discharge.  Neck: Neck supple.  Cardiovascular: Normal rate, regular rhythm and normal heart sounds.  Exam reveals no gallop and no friction rub.   No murmur heard. Pulmonary/Chest: Effort normal and breath sounds normal. No respiratory distress. She has no wheezes. She has no rales. She exhibits no tenderness.  Abdominal: Soft. She exhibits no distension. There is no tenderness.  Musculoskeletal: She exhibits no edema and no tenderness.  Neurological: She is alert.  Skin: Skin is warm and dry.  Psychiatric: She has a normal mood and affect. Her behavior is normal. Thought content normal.    ED Course  Procedures (including critical care time) Labs Review Labs Reviewed  BASIC METABOLIC PANEL - Abnormal; Notable for the following:    Sodium 135 (*)    Chloride 90 (*)    Glucose, Bld 370 (*)    Creatinine, Ser 4.52 (*)    GFR calc non Af Amer 11 (*)    GFR calc Af Amer 13 (*)    All other components within normal limits  CBC WITH DIFFERENTIAL - Abnormal; Notable for the following:    RBC 2.33 (*)    Hemoglobin 7.0 (*)    HCT 21.1 (*)    RDW 15.7 (*)    Neutrophils Relative % 86 (*)    Lymphocytes Relative 9 (*)     All other components within normal limits  TYPE AND SCREEN  PREPARE RBC (CROSSMATCH)    Imaging Review No results found.   EKG Interpretation   Date/Time:  Saturday Feb 26 2014 12:10:48 EDT Ventricular Rate:  95 PR Interval:  155 QRS Duration: 80 QT Interval:  427 QTC Calculation: 537 R Axis:   83 Text Interpretation:  Sinus rhythm Probable LVH with secondary repol abnrm  Prolonged QT interval Baseline wander in  lead(s) V2 V4 V5 ED PHYSICIAN  INTERPRETATION AVAILABLE IN [facility] Confirmed by TEST, Record  ([id number]) on 02/28/2014 10:36:05 AM      MDM   Final diagnoses:  Symptomatic anemia  ESRD on hemodialysis    37yF with dyspnea. Sounds clear on exam. CXR w/o acute abnormality. Suspect related to anemia. She was transfused 1u PRBC over several hours. Feels better. She has been pushing for discharge.  I think this is reasonable. Return precautions discussed.     Virgel Manifold, MD 03/02/14 [phone]

## 2014-02-26 NOTE — ED Notes (Signed)
PT comfortable with discharge and follow up instructions. No prescriptions.

## 2014-02-26 NOTE — ED Notes (Signed)
Pt went for dialysis today but could not complete treatment due to increase in sob. Sent here for blood transfusion due to hgb of 6.0, airway intact, spo2 100% at triage.

## 2014-02-27 LAB — TYPE AND SCREEN
ABO/RH(D): AB POS
Antibody Screen: NEGATIVE
Unit division: 0

## 2014-02-28 ENCOUNTER — Encounter (HOSPITAL_COMMUNITY): Payer: Self-pay | Admitting: Emergency Medicine

## 2014-02-28 ENCOUNTER — Inpatient Hospital Stay (HOSPITAL_COMMUNITY)
Admission: EM | Admit: 2014-02-28 | Discharge: 2014-03-02 | DRG: 682 | Disposition: A | Discharge status: Home / Self Care | Payer: Medicare Other | Attending: Internal Medicine | Admitting: Internal Medicine

## 2014-02-28 ENCOUNTER — Emergency Department (HOSPITAL_COMMUNITY): Payer: Medicare Other

## 2014-02-28 DIAGNOSIS — Z794 Long term (current) use of insulin: Secondary | ICD-10-CM

## 2014-02-28 DIAGNOSIS — I10 Essential (primary) hypertension: Secondary | ICD-10-CM

## 2014-02-28 DIAGNOSIS — M949 Disorder of cartilage, unspecified: Secondary | ICD-10-CM

## 2014-02-28 DIAGNOSIS — Z9483 Pancreas transplant status: Secondary | ICD-10-CM

## 2014-02-28 DIAGNOSIS — I169 Hypertensive crisis, unspecified: Secondary | ICD-10-CM

## 2014-02-28 DIAGNOSIS — N039 Chronic nephritic syndrome with unspecified morphologic changes: Secondary | ICD-10-CM

## 2014-02-28 DIAGNOSIS — E11319 Type 2 diabetes mellitus with unspecified diabetic retinopathy without macular edema: Secondary | ICD-10-CM | POA: Diagnosis present

## 2014-02-28 DIAGNOSIS — D649 Anemia, unspecified: Secondary | ICD-10-CM

## 2014-02-28 DIAGNOSIS — J9691 Respiratory failure, unspecified with hypoxia: Secondary | ICD-10-CM

## 2014-02-28 DIAGNOSIS — Z9981 Dependence on supplemental oxygen: Secondary | ICD-10-CM

## 2014-02-28 DIAGNOSIS — Z992 Dependence on renal dialysis: Secondary | ICD-10-CM

## 2014-02-28 DIAGNOSIS — N186 End stage renal disease: Secondary | ICD-10-CM | POA: Diagnosis present

## 2014-02-28 DIAGNOSIS — Z7982 Long term (current) use of aspirin: Secondary | ICD-10-CM

## 2014-02-28 DIAGNOSIS — J9600 Acute respiratory failure, unspecified whether with hypoxia or hypercapnia: Secondary | ICD-10-CM | POA: Diagnosis present

## 2014-02-28 DIAGNOSIS — Z91158 Patient's noncompliance with renal dialysis for other reason: Secondary | ICD-10-CM

## 2014-02-28 DIAGNOSIS — N2581 Secondary hyperparathyroidism of renal origin: Secondary | ICD-10-CM | POA: Diagnosis present

## 2014-02-28 DIAGNOSIS — Z9115 Patient's noncompliance with renal dialysis: Secondary | ICD-10-CM

## 2014-02-28 DIAGNOSIS — M899 Disorder of bone, unspecified: Secondary | ICD-10-CM | POA: Diagnosis present

## 2014-02-28 DIAGNOSIS — E1139 Type 2 diabetes mellitus with other diabetic ophthalmic complication: Secondary | ICD-10-CM | POA: Diagnosis present

## 2014-02-28 DIAGNOSIS — J810 Acute pulmonary edema: Secondary | ICD-10-CM

## 2014-02-28 DIAGNOSIS — Z940 Kidney transplant status: Secondary | ICD-10-CM

## 2014-02-28 DIAGNOSIS — Z8611 Personal history of tuberculosis: Secondary | ICD-10-CM

## 2014-02-28 DIAGNOSIS — E119 Type 2 diabetes mellitus without complications: Secondary | ICD-10-CM

## 2014-02-28 DIAGNOSIS — D631 Anemia in chronic kidney disease: Secondary | ICD-10-CM | POA: Diagnosis present

## 2014-02-28 DIAGNOSIS — Z79899 Other long term (current) drug therapy: Secondary | ICD-10-CM

## 2014-02-28 DIAGNOSIS — I120 Hypertensive chronic kidney disease with stage 5 chronic kidney disease or end stage renal disease: Principal | ICD-10-CM | POA: Diagnosis present

## 2014-02-28 LAB — I-STAT ARTERIAL BLOOD GAS, ED
Acid-base deficit: 1 mmol/L (ref 0.0–2.0)
Bicarbonate: 24.5 mEq/L — ABNORMAL HIGH (ref 20.0–24.0)
O2 Saturation: 100 %
PH ART: 7.389 (ref 7.350–7.450)
TCO2: 26 mmol/L (ref 0–100)
pCO2 arterial: 40.5 mmHg (ref 35.0–45.0)
pO2, Arterial: 183 mmHg — ABNORMAL HIGH (ref 80.0–100.0)

## 2014-02-28 LAB — I-STAT CHEM 8, ED
BUN: 70 mg/dL — ABNORMAL HIGH (ref 6–23)
CREATININE: 10.8 mg/dL — AB (ref 0.50–1.10)
Calcium, Ion: 1.03 mmol/L — ABNORMAL LOW (ref 1.12–1.23)
Chloride: 96 mEq/L (ref 96–112)
GLUCOSE: 254 mg/dL — AB (ref 70–99)
HCT: 21 % — ABNORMAL LOW (ref 36.0–46.0)
HEMOGLOBIN: 7.1 g/dL — AB (ref 12.0–15.0)
Potassium: 5 mEq/L (ref 3.7–5.3)
Sodium: 134 mEq/L — ABNORMAL LOW (ref 137–147)
TCO2: 23 mmol/L (ref 0–100)

## 2014-02-28 LAB — I-STAT CG4 LACTIC ACID, ED: Lactic Acid, Venous: 1.22 mmol/L (ref 0.5–2.2)

## 2014-02-28 MED ORDER — SODIUM CHLORIDE 0.9 % IV SOLN: 100.0000 mL | INTRAVENOUS | Status: DC | PRN

## 2014-02-28 MED ORDER — NEPRO/CARBSTEADY PO LIQD
237.0000 mL | ORAL | Status: DC | PRN
  Filled 2014-02-28: qty 237

## 2014-02-28 MED ORDER — NITROGLYCERIN IN D5W 200-5 MCG/ML-% IV SOLN
INTRAVENOUS | Status: AC
  Filled 2014-02-28: qty 250

## 2014-02-28 MED ORDER — NITROGLYCERIN 0.4 MG SL SUBL
0.4000 mg | SUBLINGUAL_TABLET | SUBLINGUAL | Status: DC | PRN
  Administered 2014-02-28: 0.4 mg via SUBLINGUAL

## 2014-02-28 MED ORDER — HEPARIN SODIUM (PORCINE) 1000 UNIT/ML DIALYSIS: 1500.0000 [IU] | INTRAMUSCULAR | Status: DC | PRN

## 2014-02-28 MED ORDER — HEPARIN SODIUM (PORCINE) 1000 UNIT/ML DIALYSIS
1000.0000 [IU] | INTRAMUSCULAR | Status: DC | PRN
  Filled 2014-02-28: qty 1

## 2014-02-28 MED ORDER — NITROGLYCERIN IN D5W 200-5 MCG/ML-% IV SOLN: 2.0000 ug/min | Freq: Once | INTRAVENOUS | Status: DC

## 2014-02-28 MED ORDER — PENTAFLUOROPROP-TETRAFLUOROETH EX AERO
1.0000 "application " | INHALATION_SPRAY | CUTANEOUS | Status: DC | PRN
  Filled 2014-02-28: qty 103.5

## 2014-02-28 MED ORDER — NITROGLYCERIN IN D5W 200-5 MCG/ML-% IV SOLN
2.0000 ug/min | INTRAVENOUS | Status: DC
  Administered 2014-02-28: 100 ug/min via INTRAVENOUS

## 2014-02-28 MED ORDER — LIDOCAINE HCL (PF) 1 % IJ SOLN: 5.0000 mL | INTRAMUSCULAR | Status: DC | PRN

## 2014-02-28 MED ORDER — ALTEPLASE 2 MG IJ SOLR
2.0000 mg | Freq: Once | INTRAMUSCULAR | Status: AC | PRN
  Filled 2014-02-28: qty 2

## 2014-02-28 MED ORDER — LIDOCAINE-PRILOCAINE 2.5-2.5 % EX CREA
1.0000 "application " | TOPICAL_CREAM | CUTANEOUS | Status: DC | PRN
  Filled 2014-02-28: qty 5

## 2014-02-28 NOTE — ED Provider Notes (Signed)
CSN: [id number]     Arrival date & time 02/28/14  2156 History   First MD Initiated Contact with Patient 02/28/14 2202     Chief Complaint  Patient presents with  . Respiratory Distress     (Consider location/radiation/quality/duration/timing/severity/associated sxs/prior Treatment) Patient is a 38 y.o. female presenting with shortness of breath. The history is provided by the patient.  Shortness of Breath Severity:  Severe Onset quality:  Gradual Duration:  1 day Timing:  Constant Progression:  Worsening Chronicity:  New Context: activity   Relieved by:  Nothing Worsened by:  Nothing tried Ineffective treatments:  Oxygen Associated symptoms: cough and PND   Associated symptoms: no fever and no hemoptysis   Risk factors comment:  End stage renal disease on dialysis   Past Medical History  Diagnosis Date  . HTN (hypertension)     not well controlled  . 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 History of renal transplant     Hx of pancreas-kidney transplant in 2010, started back on dialysis in Oct 2013. Back on insulin also due to failure of pancreatic graft.    . 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 ESRD on dialysis     "[facility]; TTS; (01/27/2013)  . History of blood transfusion     "lots of times" (01/27/2013)  . CAP (community acquired pneumonia) 2010  . Type II diabetes mellitus   . Anemia   . Ovarian tumor, malignant 2005    resected in [place]' pt denies that this was cancer on 01/27/2013  . Diabetic retinopathy   . Hemodialysis patient   . On home oxygen therapy     "5L; 24/7" (01/25/2014)   Past Surgical History  Procedure Laterality Date  . Tumor removal  2005    Ovarian, resected in [place]  . Av fistula placement  06/03/2012    Procedure: INSERTION OF ARTERIOVENOUS (AV) GORE-TEX GRAFT ARM;  Surgeon: Rosetta Posner, MD;   Location: [place];  Service: Vascular;  Laterality: Left;  [name] Eye surgery Right     "cause of diabetes" (4/23/20140  . Combined kidney-pancreas transplant  2010    /notes 01/27/2013; [facility]  . 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 Renal biopsy  12/13/2010; 10/25/2011    Archie Endo 01/27/2013   Family History  Problem Relation Age of Onset  . Diabetes Mother   . Liver cancer Father   . Cancer Father    History  Substance Use Topics  . Smoking status: Never Smoker   . Smokeless tobacco: Never Used  . Alcohol Use: No   OB History   Grav Para Term Preterm Abortions TAB SAB Ect Mult Living                 Review of Systems  Unable to perform ROS: Acuity of condition  Constitutional: Negative for fever.  Respiratory: Positive for cough and shortness of breath. Negative for hemoptysis.   Cardiovascular: Positive for PND.      Allergies  Ace inhibitors  Home Medications   Prior to Admission medications   Medication Sig Start Date End Date Taking? Authorizing Provider  acetaminophen (TYLENOL) 500 MG tablet Take 1,000 mg by mouth every 6 (six) hours as needed for moderate pain.    Historical Provider, MD  amLODipine (  NORVASC) 10 MG tablet Take 10 mg by mouth at bedtime.     Historical Provider, MD  aspirin EC 81 MG tablet Take 81 mg by mouth at bedtime.     Historical Provider, MD  calcium acetate (PHOSLO) 667 MG capsule Take 3 capsules (2,001 mg total) by mouth 3 (three) times daily with meals. 01/28/13   Myriam Jacobson, PA-C  cloNIDine (CATAPRES) 0.1 MG tablet Take 0.1 mg by mouth 2 (two) times daily.    Historical Provider, MD  diphenhydrAMINE (BENADRYL) 25 mg capsule Take 25 mg by mouth daily as needed for allergies.    Historical Provider, MD  escitalopram (LEXAPRO) 20 MG tablet Take 10 mg by mouth daily.     Historical Provider, MD  HYDROcodone-acetaminophen (NORCO/VICODIN) 5-325 MG per tablet Take 1-2  tablets by mouth every 4 (four) hours as needed for moderate pain. 01/25/14   Sheila Oats, MD  insulin aspart (NOVOLOG) 100 UNIT/ML injection Inject 12 Units into the skin 3 (three) times daily with meals.    Historical Provider, MD  insulin glargine (LANTUS) 100 UNIT/ML injection Inject 63 Units into the skin at bedtime.     Historical Provider, MD  labetalol (NORMODYNE) 300 MG tablet Take 300 mg by mouth 2 (two) times daily.    Historical Provider, MD  metoCLOPramide (REGLAN) 5 MG tablet Take 5 mg by mouth 4 (four) times daily.    Historical Provider, MD  multivitamin (RENA-VIT) TABS tablet Take 1 tablet by mouth daily.    Historical Provider, MD  pantoprazole (PROTONIX) 40 MG tablet  02/14/14   Historical Provider, MD  Probiotic Product (PROBIOTIC DAILY) CAPS Take 1 capsule by mouth daily.    Historical Provider, MD  simvastatin (ZOCOR) 20 MG tablet Take 20 mg by mouth every Monday, Wednesday, and Friday.    Historical Provider, MD   BP 162/87  Pulse 101  Temp(Src) 97.7 F (36.5 C) (Axillary)  Resp 34  SpO2 100%  LMP 02/28/2014 Physical Exam  Vitals reviewed. Constitutional: She appears well-developed. She appears toxic. She appears distressed.  Laboring on BiPAP  HENT:  Head: Normocephalic.  Eyes: Conjunctivae are normal.  Neck: Neck supple. No tracheal deviation present.  Cardiovascular: Regular rhythm.  Tachycardia present.   Pulmonary/Chest: Accessory muscle usage present. Tachypnea noted. She is in respiratory distress. She has rales (Diffusely).  Abdominal: Soft. She exhibits no distension. There is no tenderness.  Neurological: She is alert.  Skin: She is diaphoretic. There is pallor.  Psychiatric: Her mood appears anxious.    ED Course  CRITICAL CARE Performed by: Leo Grosser Authorized by: Blanchie Dessert Total critical care time: 30 minutes Critical care time was exclusive of separately billable procedures and treating other patients. Critical care was  necessary to treat or prevent imminent or life-threatening deterioration of the following conditions: respiratory failure. Critical care was time spent personally by me on the following activities: blood draw for specimens, development of treatment plan with patient or surrogate, discussions with consultants, interpretation of cardiac output measurements, evaluation of patient's response to treatment, examination of patient, obtaining history from patient or surrogate, ordering and performing treatments and interventions, ordering and review of laboratory studies, ordering and review of radiographic studies, pulse oximetry, re-evaluation of patient's condition, review of old charts and ventilator management.   (including critical care time) Labs Review Labs Reviewed  I-STAT CHEM 8, ED - Abnormal; Notable for the following:    Sodium 134 (*)    BUN 70 (*)    Creatinine,  Ser 10.80 (*)    Glucose, Bld 254 (*)    Calcium, Ion 1.03 (*)    Hemoglobin 7.1 (*)    HCT 21.0 (*)    All other components within normal limits  I-STAT ARTERIAL BLOOD GAS, ED - Abnormal; Notable for the following:    pO2, Arterial 183.0 (*)    Bicarbonate 24.5 (*)    All other components within normal limits  BLOOD GAS, ARTERIAL  I-STAT CG4 LACTIC ACID, ED    Imaging Review Dg Chest Port 1 View  02/28/2014   CLINICAL DATA:  Respiratory distress, history hypertension, TB, renal and pancreatic transplant, diabetes  EXAM: PORTABLE CHEST - 1 VIEW  COMPARISON:  Portable exam 2211 hr compared to 02/26/2014  FINDINGS: Enlargement of cardiac silhouette with pulmonary vascular congestion.  Diffuse pulmonary edema.  No gross pneumothorax or acute osseous findings.  IMPRESSION: CHF.   Electronically Signed   By: Lavonia [name] M.D.   On: 02/28/2014 22:40     EKG Interpretation None      MDM   Final diagnoses:  Respiratory failure with hypoxia  Pulmonary edema, acute  ESRD on hemodialysis  Hypertensive crisis    38 y.o.  female presents with acute respiratory failure, hypertension, significant pulmonary edema evident with pink froth coming from her nose. She was laboring very hard with EMS on CPAP and retracting, improved on nitroglycerin drip and BiPAP with significant pulmonary edema on chest x-ray on arrival desaturating and tachycardic, wearing out from increased work of breathing over the course of the day.  Patient has had anemia prior but is not anemic today, has not missed any dialysis appointments, unsure of why the patient has flash pulmonary edema. Nephrology was consulted to dialyze the patient emergently and they moved her to the unit for dialysis with a plan to go to a step down bed with the hospitalist following treatment. No fever, no infectious symptoms currently, will defer and advised to inpatient team as indicated.    Leo Grosser, MD 03/01/14 [phone]

## 2014-02-28 NOTE — Progress Notes (Signed)
02/28/14 2344  BiPAP/CPAP/SIPAP  BiPAP/CPAP/SIPAP Pt Type Adult  Mask Type Full face mask  Mask Size Large  Set Rate 15 breaths/min  Respiratory Rate 29 breaths/min  IPAP 12 cmH20  EPAP 5 cmH2O  Oxygen Percent 40 %  Minute Ventilation 15.7  Leak 70  Peak Inspiratory Pressure (PIP) 12  Tidal Volume (Vt) 690  BiPAP/CPAP/SIPAP BiPAP  Patient Home Equipment No  Auto Titrate No  BiPAP/CPAP /SiPAP Vitals  Pulse Rate 104  Resp ! 29  SpO2 100 %  Patient transported to Hemodialysis on the above settings.

## 2014-02-28 NOTE — ED Notes (Signed)
Pt to ED via [facility] with c/o resp. Distress.  Pt on C-pap on arrival.

## 2014-02-28 NOTE — ED Notes (Signed)
Port chest x-ray obtained.

## 2014-02-28 NOTE — ED Notes (Signed)
Pt remains alert and oriented.  Tolerating C-Pap without any problems

## 2014-03-01 DIAGNOSIS — Z9115 Patient's noncompliance with renal dialysis: Secondary | ICD-10-CM | POA: Diagnosis not present

## 2014-03-01 DIAGNOSIS — J810 Acute pulmonary edema: Secondary | ICD-10-CM | POA: Diagnosis present

## 2014-03-01 DIAGNOSIS — J9691 Respiratory failure, unspecified with hypoxia: Secondary | ICD-10-CM | POA: Diagnosis present

## 2014-03-01 DIAGNOSIS — N186 End stage renal disease: Secondary | ICD-10-CM | POA: Diagnosis present

## 2014-03-01 DIAGNOSIS — I120 Hypertensive chronic kidney disease with stage 5 chronic kidney disease or end stage renal disease: Secondary | ICD-10-CM | POA: Diagnosis present

## 2014-03-01 DIAGNOSIS — E119 Type 2 diabetes mellitus without complications: Secondary | ICD-10-CM

## 2014-03-01 DIAGNOSIS — Z79899 Other long term (current) drug therapy: Secondary | ICD-10-CM | POA: Diagnosis not present

## 2014-03-01 DIAGNOSIS — J9600 Acute respiratory failure, unspecified whether with hypoxia or hypercapnia: Secondary | ICD-10-CM

## 2014-03-01 DIAGNOSIS — Z9981 Dependence on supplemental oxygen: Secondary | ICD-10-CM | POA: Diagnosis not present

## 2014-03-01 DIAGNOSIS — D649 Anemia, unspecified: Secondary | ICD-10-CM

## 2014-03-01 DIAGNOSIS — Z7982 Long term (current) use of aspirin: Secondary | ICD-10-CM | POA: Diagnosis not present

## 2014-03-01 DIAGNOSIS — E11319 Type 2 diabetes mellitus with unspecified diabetic retinopathy without macular edema: Secondary | ICD-10-CM | POA: Diagnosis present

## 2014-03-01 DIAGNOSIS — Z9483 Pancreas transplant status: Secondary | ICD-10-CM | POA: Diagnosis not present

## 2014-03-01 DIAGNOSIS — R0602 Shortness of breath: Secondary | ICD-10-CM | POA: Diagnosis present

## 2014-03-01 DIAGNOSIS — I10 Essential (primary) hypertension: Secondary | ICD-10-CM

## 2014-03-01 DIAGNOSIS — N2581 Secondary hyperparathyroidism of renal origin: Secondary | ICD-10-CM | POA: Diagnosis present

## 2014-03-01 DIAGNOSIS — N039 Chronic nephritic syndrome with unspecified morphologic changes: Secondary | ICD-10-CM | POA: Diagnosis present

## 2014-03-01 DIAGNOSIS — Z8611 Personal history of tuberculosis: Secondary | ICD-10-CM | POA: Diagnosis not present

## 2014-03-01 DIAGNOSIS — E1139 Type 2 diabetes mellitus with other diabetic ophthalmic complication: Secondary | ICD-10-CM | POA: Diagnosis present

## 2014-03-01 DIAGNOSIS — Z992 Dependence on renal dialysis: Secondary | ICD-10-CM | POA: Diagnosis not present

## 2014-03-01 DIAGNOSIS — Z794 Long term (current) use of insulin: Secondary | ICD-10-CM | POA: Diagnosis not present

## 2014-03-01 DIAGNOSIS — M899 Disorder of bone, unspecified: Secondary | ICD-10-CM | POA: Diagnosis present

## 2014-03-01 DIAGNOSIS — Z940 Kidney transplant status: Secondary | ICD-10-CM | POA: Diagnosis not present

## 2014-03-01 DIAGNOSIS — D631 Anemia in chronic kidney disease: Secondary | ICD-10-CM | POA: Diagnosis present

## 2014-03-01 LAB — COMPREHENSIVE METABOLIC PANEL
ALBUMIN: 3.3 g/dL — AB (ref 3.5–5.2)
ALT: 14 U/L (ref 0–35)
AST: 14 U/L (ref 0–37)
Alkaline Phosphatase: 64 U/L (ref 39–117)
BUN: 26 mg/dL — AB (ref 6–23)
CALCIUM: 9.4 mg/dL (ref 8.4–10.5)
CHLORIDE: 95 meq/L — AB (ref 96–112)
CO2: 26 mEq/L (ref 19–32)
CREATININE: 4.94 mg/dL — AB (ref 0.50–1.10)
GFR calc Af Amer: 12 mL/min — ABNORMAL LOW (ref >90)
GFR calc non Af Amer: 10 mL/min — ABNORMAL LOW (ref >90)
Glucose, Bld: 142 mg/dL — ABNORMAL HIGH (ref 70–99)
Potassium: 4.2 mEq/L (ref 3.7–5.3)
Sodium: 136 mEq/L — ABNORMAL LOW (ref 137–147)
TOTAL PROTEIN: 8.3 g/dL (ref 6.0–8.3)
Total Bilirubin: 0.6 mg/dL (ref 0.3–1.2)

## 2014-03-01 LAB — GLUCOSE, CAPILLARY
GLUCOSE-CAPILLARY: 78 mg/dL (ref 70–99)
GLUCOSE-CAPILLARY: 241 mg/dL — AB (ref 70–99)
Glucose-Capillary: 152 mg/dL — ABNORMAL HIGH (ref 70–99)

## 2014-03-01 LAB — CBC
HEMATOCRIT: 22.9 % — AB (ref 36.0–46.0)
HEMOGLOBIN: 7.7 g/dL — AB (ref 12.0–15.0)
MCH: 30.8 pg (ref 26.0–34.0)
MCHC: 33.6 g/dL (ref 30.0–36.0)
MCV: 91.6 fL (ref 78.0–100.0)
Platelets: 264 10*3/uL (ref 150–400)
RBC: 2.5 MIL/uL — AB (ref 3.87–5.11)
RDW: 16.1 % — ABNORMAL HIGH (ref 11.5–15.5)
WBC: 13.7 10*3/uL — AB (ref 4.0–10.5)

## 2014-03-01 MED ORDER — BISACODYL 10 MG RE SUPP: 10.0000 mg | Freq: Every day | RECTAL | Status: DC | PRN

## 2014-03-01 MED ORDER — AMLODIPINE BESYLATE 10 MG PO TABS
10.0000 mg | ORAL_TABLET | Freq: Every day | ORAL | Status: DC
  Administered 2014-03-01: 10 mg via ORAL
  Filled 2014-03-01 (×2): qty 1

## 2014-03-01 MED ORDER — SODIUM CHLORIDE 0.9 % IV SOLN: 250.0000 mL | INTRAVENOUS | Status: DC | PRN

## 2014-03-01 MED ORDER — RENA-VITE PO TABS
1.0000 | ORAL_TABLET | Freq: Every day | ORAL | Status: DC
  Administered 2014-03-01 – 2014-03-02 (×2): 1 via ORAL
  Filled 2014-03-01 (×2): qty 1

## 2014-03-01 MED ORDER — CALCIUM ACETATE 667 MG PO CAPS
2001.0000 mg | ORAL_CAPSULE | Freq: Three times a day (TID) | ORAL | Status: DC
  Administered 2014-03-01 – 2014-03-02 (×4): 2001 mg via ORAL
  Filled 2014-03-01 (×7): qty 3

## 2014-03-01 MED ORDER — LABETALOL HCL 300 MG PO TABS
300.0000 mg | ORAL_TABLET | Freq: Two times a day (BID) | ORAL | Status: DC
  Administered 2014-03-01 (×2): 300 mg via ORAL
  Filled 2014-03-01 (×4): qty 1

## 2014-03-01 MED ORDER — ALUM & MAG HYDROXIDE-SIMETH 200-200-20 MG/5ML PO SUSP: 30.0000 mL | Freq: Four times a day (QID) | ORAL | Status: DC | PRN

## 2014-03-01 MED ORDER — DOCUSATE SODIUM 100 MG PO CAPS
100.0000 mg | ORAL_CAPSULE | Freq: Two times a day (BID) | ORAL | Status: DC
  Administered 2014-03-01 – 2014-03-02 (×3): 100 mg via ORAL
  Filled 2014-03-01 (×3): qty 1

## 2014-03-01 MED ORDER — CHLORHEXIDINE GLUCONATE 0.12 % MT SOLN
15.0000 mL | Freq: Two times a day (BID) | OROMUCOSAL | Status: DC
  Filled 2014-03-01 (×5): qty 15

## 2014-03-01 MED ORDER — MAGNESIUM CITRATE PO SOLN
1.0000 | Freq: Once | ORAL | Status: AC | PRN
  Filled 2014-03-01: qty 296

## 2014-03-01 MED ORDER — ONDANSETRON HCL 4 MG/2ML IJ SOLN: 4.0000 mg | Freq: Four times a day (QID) | INTRAMUSCULAR | Status: DC | PRN

## 2014-03-01 MED ORDER — METOCLOPRAMIDE HCL 5 MG PO TABS
5.0000 mg | ORAL_TABLET | Freq: Four times a day (QID) | ORAL | Status: DC
  Administered 2014-03-01 – 2014-03-02 (×4): 5 mg via ORAL
  Filled 2014-03-01 (×8): qty 1

## 2014-03-01 MED ORDER — INSULIN GLARGINE 100 UNIT/ML ~~LOC~~ SOLN
63.0000 [IU] | Freq: Every day | SUBCUTANEOUS | Status: DC
  Administered 2014-03-01: 63 [IU] via SUBCUTANEOUS
  Filled 2014-03-01 (×2): qty 0.63

## 2014-03-01 MED ORDER — INSULIN GLARGINE 100 UNIT/ML ~~LOC~~ SOLN: 63.0000 [IU] | Freq: Every day | SUBCUTANEOUS | Status: DC

## 2014-03-01 MED ORDER — SIMVASTATIN 20 MG PO TABS
20.0000 mg | ORAL_TABLET | ORAL | Status: DC
  Administered 2014-03-02: 20 mg via ORAL
  Filled 2014-03-01: qty 1

## 2014-03-01 MED ORDER — RISAQUAD PO CAPS
1.0000 | ORAL_CAPSULE | Freq: Every day | ORAL | Status: DC
  Administered 2014-03-01 – 2014-03-02 (×2): 1 via ORAL
  Filled 2014-03-01 (×2): qty 1

## 2014-03-01 MED ORDER — ASPIRIN EC 81 MG PO TBEC
81.0000 mg | DELAYED_RELEASE_TABLET | Freq: Every day | ORAL | Status: DC
  Administered 2014-03-01: 81 mg via ORAL
  Filled 2014-03-01 (×2): qty 1

## 2014-03-01 MED ORDER — HEPARIN SODIUM (PORCINE) 5000 UNIT/ML IJ SOLN
5000.0000 [IU] | Freq: Three times a day (TID) | INTRAMUSCULAR | Status: DC
  Administered 2014-03-01 – 2014-03-02 (×4): 5000 [IU] via SUBCUTANEOUS
  Filled 2014-03-01 (×7): qty 1

## 2014-03-01 MED ORDER — BIOTENE DRY MOUTH MT LIQD
15.0000 mL | Freq: Two times a day (BID) | OROMUCOSAL | Status: DC
  Administered 2014-03-01 (×2): 15 mL via OROMUCOSAL

## 2014-03-01 MED ORDER — ACETAMINOPHEN 500 MG PO TABS: 1000.0000 mg | ORAL_TABLET | Freq: Four times a day (QID) | ORAL | Status: DC | PRN

## 2014-03-01 MED ORDER — PANTOPRAZOLE SODIUM 40 MG PO TBEC
40.0000 mg | DELAYED_RELEASE_TABLET | Freq: Every day | ORAL | Status: DC
  Administered 2014-03-01 – 2014-03-02 (×2): 40 mg via ORAL
  Filled 2014-03-01 (×2): qty 1

## 2014-03-01 MED ORDER — HYDROCODONE-ACETAMINOPHEN 5-325 MG PO TABS: 1.0000 | ORAL_TABLET | ORAL | Status: DC | PRN

## 2014-03-01 MED ORDER — ONDANSETRON HCL 4 MG PO TABS: 4.0000 mg | ORAL_TABLET | Freq: Four times a day (QID) | ORAL | Status: DC | PRN

## 2014-03-01 MED ORDER — ESCITALOPRAM OXALATE 10 MG PO TABS
10.0000 mg | ORAL_TABLET | Freq: Every day | ORAL | Status: DC
  Administered 2014-03-01 – 2014-03-02 (×2): 10 mg via ORAL
  Filled 2014-03-01 (×2): qty 1

## 2014-03-01 MED ORDER — DIPHENHYDRAMINE HCL 25 MG PO CAPS: 25.0000 mg | ORAL_CAPSULE | Freq: Every day | ORAL | Status: DC | PRN

## 2014-03-01 MED ORDER — SODIUM CHLORIDE 0.9 % IJ SOLN
3.0000 mL | Freq: Two times a day (BID) | INTRAMUSCULAR | Status: DC
  Administered 2014-03-01 – 2014-03-02 (×3): 3 mL via INTRAVENOUS

## 2014-03-01 MED ORDER — CLONIDINE HCL 0.1 MG PO TABS
0.1000 mg | ORAL_TABLET | Freq: Two times a day (BID) | ORAL | Status: DC
  Administered 2014-03-01 (×2): 0.1 mg via ORAL
  Filled 2014-03-01 (×4): qty 1

## 2014-03-01 MED ORDER — INSULIN ASPART 100 UNIT/ML ~~LOC~~ SOLN
12.0000 [IU] | Freq: Three times a day (TID) | SUBCUTANEOUS | Status: DC
  Administered 2014-03-01 – 2014-03-02 (×5): 12 [IU] via SUBCUTANEOUS

## 2014-03-01 MED ORDER — SODIUM CHLORIDE 0.9 % IJ SOLN: 3.0000 mL | INTRAMUSCULAR | Status: DC | PRN

## 2014-03-01 NOTE — Progress Notes (Signed)
Inpatient Diabetes Program Recommendations  AACE/ADA: New Consensus Statement on Inpatient Glycemic Control (2013)  Target Ranges:  Prepandial:   less than 140 mg/dL      Peak postprandial:   less than 180 mg/dL (1-2 hours)      Critically ill patients:  140 - 180 mg/dL   Reason for Assessment:  Results for [name], [name] (MRN [id number]) as of 03/01/2014 14:47  Ref. Range 03/01/2014 08:00 03/01/2014 12:01  Glucose-Capillary Latest Range: 70-99 mg/dL 152 (H) 78   Diabetes history: Diabetes/hx. Renal/pancreas transplant Outpatient Diabetes medications: Lantus 63 units daily, Novolog 12 units tid with meals.  Current orders for Inpatient glycemic control: Lantus 63 units q HS, Novolog 12 units tid with meals.  While in the hospital, may consider reduction insulin doses.  Consider reducing  Lantus to 32 units And decrease Novolog meal coverage to 6 units tid with meals (Approximately 1/2 of home doses).  Also may consider adding Novolog sensitive correction tid with meals.  Will follow. Adah Perl, RN, BC-ADM Inpatient Diabetes Coordinator Pager [phone]

## 2014-03-01 NOTE — Progress Notes (Signed)
Pt does not wish to be placed on Bipap for the night. Pt resting well and in no distress at this time. RT will continue to monitor.

## 2014-03-01 NOTE — H&P (Signed)
[facility] History and Physical  [name] QVZ:[id number] DOB: 07-01-1976 DOA: 02/28/2014  Referring physician: Leo Grosser, MD PCP: Tommy Medal, MD   Chief Complaint: Acute respiratory failure  HPI: [name] is a 38 y.o. female with endstage renal disease on chronic dialysis presented to the ED with onset of increased shortness of breath. She was noted on her CXR to have pulmonary edema and has been sent to emergent hemodialysis. Patient states that she has not had any chest pain. She has no fevers or chills. She has not had any increased edema. She has not missed any of her regular dialysis sessions. Patient states that she has no abdominal pain. She has not had any increased fluid consumption either. She denies having any cough she did have some pink frothy material from her mouth and nose in the ED per their notes.    Review of Systems:  Constitutional:  No weight loss, night sweats, Fevers, chills, ++fatigue.  HEENT:  No headaches, Difficulty swallowing Cardio-vascular:  No chest pain, ++Orthopnea, ++PND GI:  No heartburn, indigestion, abdominal pain, nausea, vomiting, diarrhea Resp:  No shortness of breath with exertion or at rest. No excess mucus, no productive cough, No non-productive cough, No coughing up of blood (pink frothy material noted by ED) Skin:  no rash or lesions.  GU:  no dysuria, change in color of urine, no urgency or frequency. No flank pain.  Musculoskeletal:  No joint pain or swelling. No decreased range of motion. No back pain.  Psych:  No change in mood or affect. No depression or anxiety. No memory loss.   Past Medical History  Diagnosis Date  . HTN (hypertension)     not well controlled  . 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 History of renal transplant     Hx of pancreas-kidney transplant in 2010, started back on dialysis in Oct 2013. Back on  insulin also due to failure of pancreatic graft.    . 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 ESRD on dialysis     "[facility]; TTS; (01/27/2013)  . History of blood transfusion     "lots of times" (01/27/2013)  . CAP (community acquired pneumonia) 2010  . Type II diabetes mellitus   . Anemia   . Ovarian tumor, malignant 2005    resected in [place]' pt denies that this was cancer on 01/27/2013  . Diabetic retinopathy   . Hemodialysis patient   . On home oxygen therapy     "5L; 24/7" (01/25/2014)   Past Surgical History  Procedure Laterality Date  . Tumor removal  2005    Ovarian, resected in [place]  . Av fistula placement  06/03/2012    Procedure: INSERTION OF ARTERIOVENOUS (AV) GORE-TEX GRAFT ARM;  Surgeon: Rosetta Posner, MD;  Location: [facility];  Service: Vascular;  Laterality: Left;  [name] Eye surgery Right     "cause of diabetes" (4/23/20140  . Combined kidney-pancreas transplant  2010    /notes 01/27/2013; @ [facility]  . 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 Renal biopsy  12/13/2010; 10/25/2011    Archie Endo 01/27/2013   Social History:  reports that she has never smoked. She has never used smokeless tobacco. She reports that she does not drink alcohol or use illicit drugs.  Allergies  Allergen Reactions  . Ace Inhibitors Cough    Family History  Problem Relation Age of Onset  . Diabetes Mother   . Liver cancer Father   . Cancer Father      Prior to Admission medications   Medication Sig Start Date End Date Taking? Authorizing Provider  acetaminophen (TYLENOL) 500 MG tablet Take 1,000 mg by mouth every 6 (six) hours as needed for moderate pain.    Historical Provider, MD  amLODipine (NORVASC) 10 MG tablet Take 10 mg by mouth at bedtime.     Historical Provider, MD  aspirin EC 81 MG tablet Take 81 mg by mouth at bedtime.     Historical Provider, MD  calcium acetate (PHOSLO) 667  MG capsule Take 3 capsules (2,001 mg total) by mouth 3 (three) times daily with meals. 01/28/13   Myriam Jacobson, PA-C  cloNIDine (CATAPRES) 0.1 MG tablet Take 0.1 mg by mouth 2 (two) times daily.    Historical Provider, MD  diphenhydrAMINE (BENADRYL) 25 mg capsule Take 25 mg by mouth daily as needed for allergies.    Historical Provider, MD  escitalopram (LEXAPRO) 20 MG tablet Take 10 mg by mouth daily.     Historical Provider, MD  HYDROcodone-acetaminophen (NORCO/VICODIN) 5-325 MG per tablet Take 1-2 tablets by mouth every 4 (four) hours as needed for moderate pain. 01/25/14   Sheila Oats, MD  insulin aspart (NOVOLOG) 100 UNIT/ML injection Inject 12 Units into the skin 3 (three) times daily with meals.    Historical Provider, MD  insulin glargine (LANTUS) 100 UNIT/ML injection Inject 63 Units into the skin at bedtime.     Historical Provider, MD  labetalol (NORMODYNE) 300 MG tablet Take 300 mg by mouth 2 (two) times daily.    Historical Provider, MD  metoCLOPramide (REGLAN) 5 MG tablet Take 5 mg by mouth 4 (four) times daily.    Historical Provider, MD  multivitamin (RENA-VIT) TABS tablet Take 1 tablet by mouth daily.    Historical Provider, MD  pantoprazole (PROTONIX) 40 MG tablet  02/14/14   Historical Provider, MD  Probiotic Product (PROBIOTIC DAILY) CAPS Take 1 capsule by mouth daily.    Historical Provider, MD  simvastatin (ZOCOR) 20 MG tablet Take 20 mg by mouth every Monday, Wednesday, and Friday.    Historical Provider, MD   Physical Exam: Filed Vitals:   03/01/14 0018  BP: 169/89  Pulse: 106  Temp:   Resp:     BP 169/89  Pulse 106  Temp(Src) 98 F (36.7 C) (Tympanic)  Resp 29  SpO2 100%  LMP 02/28/2014  General:  Appears calm and comfortable Eyes: PERRL, normal lids, irises & conjunctiva ENT: grossly normal hearing, lips & tongue Neck: no LAD, masses or thyromegaly Cardiovascular: RRR, no m/r/g. No LE edema. Respiratory: CTA bilaterally, no w/r/r. On  BiPap Abdomen: soft, ntnd Skin: no rash or induration seen on limited exam Musculoskeletal: grossly normal tone BUE/BLE Psychiatric: grossly normal mood and affect, speech fluent and appropriate Neurologic: grossly non-focal. Moves all extremities          Labs on Admission:  Basic Metabolic Panel:  Recent Labs Lab 02/26/14 1212 02/28/14 2235  NA 135* 134*  K 4.1 5.0  CL 90* 96  CO2 30  --   GLUCOSE 370* 254*  BUN 22 70*  CREATININE 4.52* 10.80*  CALCIUM 9.1  --    Liver Function Tests: No results found for this basename: AST, ALT, ALKPHOS, BILITOT, PROT, ALBUMIN,  in the  last 168 hours No results found for this basename: LIPASE, AMYLASE,  in the last 168 hours No results found for this basename: AMMONIA,  in the last 168 hours CBC:  Recent Labs Lab 02/26/14 1212 02/28/14 2235  WBC 8.5  --   NEUTROABS 7.4  --   HGB 7.0* 7.1*  HCT 21.1* 21.0*  MCV 90.6  --   PLT 288  --    Cardiac Enzymes: No results found for this basename: CKTOTAL, CKMB, CKMBINDEX, TROPONINI,  in the last 168 hours  BNP (last 3 results) No results found for this basename: PROBNP,  in the last 8760 hours CBG: No results found for this basename: GLUCAP,  in the last 168 hours  Radiological Exams on Admission: Dg Chest Port 1 View  02/28/2014   CLINICAL DATA:  Respiratory distress, history hypertension, TB, renal and pancreatic transplant, diabetes  EXAM: PORTABLE CHEST - 1 VIEW  COMPARISON:  Portable exam 2211 hr compared to 02/26/2014  FINDINGS: Enlargement of cardiac silhouette with pulmonary vascular congestion.  Diffuse pulmonary edema.  No gross pneumothorax or acute osseous findings.  IMPRESSION: CHF.   Electronically Signed   By: Lavonia [name] M.D.   On: 02/28/2014 22:40     EKG: Independently reviewed. Sinus tachycardia with non-specific changes  Assessment/Plan Active Problems:   Respiratory failure with hypoxia   Acute pulmonary edema   1. Acute respiratory failure -likely  related to underlying acute fluid overload -she is being emergently dialysed -will continue with BIPAP -admit to step down unit  2. Acute Pulmonary edema -unclear as to the cause -will emergently be undergoing HD  3. CKD endstage -as above per nephrology  4. DM Type II -continue with insulin -will monitor CBG -carb modified diet  5. Hypertension -presently not controlled maybe due to fluid overload -she was started on nitroglycerin in the ED will stop while on dialysis and she also is not having any chest pain   Code Status: Full Code (must indicate code status--if unknown or must be presumed, indicate so) Family Communication: No family in room (indicate person spoken with, if applicable, with phone number if by telephone) Disposition Plan: Home (indicate anticipated LOS)  Time spent: 44min  Saadat A Khan [facility] Pager [phone]  **Disclaimer: This note may have been dictated with voice recognition software. Similar sounding words can inadvertently be transcribed and this note may contain transcription errors which may not have been corrected upon publication of note.**

## 2014-03-01 NOTE — Progress Notes (Signed)
UR completed. Jonnie Finner RN CCM

## 2014-03-01 NOTE — Consult Note (Signed)
[facility] Renal Consultation Note  Indication for Consultation:  Management of ESRD/hemodialysis; anemia, hypertension/volume and secondary hyperparathyroidism  HPI: [name] is a 38 y.o. female was admitted with respiratory Distress with pulmonary edema last pm requiring Hemodialysis and BiPAP. She had hemodialysis Sat. 02/06/14 only 2.5 hrs of 4 hrs tx time signed off early with sob and hgb 6.1. Came to er  And received 1 u prbcs  Felt better and was discharged home.      She reports becoming progressively sob and presented back last night with her volume overload .  Feels better now with 4 l uf with hd and off bipap. 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 Reviewing her hd txs at op center [facility] show she has signed off early multiple times.=the last 5 txs never ran her 4 hrs time sec to early signoff.   Past Medical History  Diagnosis Date  . HTN (hypertension)     not well controlled  . 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 History of renal transplant     Hx of pancreas-kidney transplant in 2010, started back on dialysis in Oct 2013. Back on insulin also due to failure of pancreatic graft.    . 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 ESRD on dialysis     "[facility]; TTS; (01/27/2013)  . History of blood transfusion     "lots of times" (01/27/2013)  . CAP (community acquired pneumonia) 2010  . Type II diabetes mellitus   . Anemia   . Ovarian tumor, malignant 2005    resected in [place]' pt denies that this was cancer on 01/27/2013  . Diabetic retinopathy   . Hemodialysis patient   . On home oxygen therapy     "5L; 24/7" (01/25/2014)    Past Surgical History  Procedure Laterality  Date  . Tumor removal  2005    Ovarian, resected in [place]  . Av fistula placement  06/03/2012    Procedure: INSERTION OF ARTERIOVENOUS (AV) GORE-TEX GRAFT ARM;  Surgeon: Rosetta Posner, MD;  Location: [facility];  Service: Vascular;  Laterality: Left;  [name] Eye surgery Right     "cause of diabetes" (4/23/20140  . Combined kidney-pancreas transplant  2010    /notes 01/27/2013; @ [facility]  . 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 Renal biopsy  12/13/2010; 10/25/2011    Archie Endo 01/27/2013      Family History  Problem Relation Age of Onset  . Diabetes Mother   . Liver cancer Father   . Cancer Father       reports that she has never smoked. She has never used smokeless tobacco. She reports that she does not drink alcohol or use illicit drugs.   Allergies  Allergen Reactions  . Ace Inhibitors Cough    Prior to Admission medications   Medication Sig Start Date End Date Taking? Authorizing Provider  acetaminophen (TYLENOL) 500 MG tablet Take 1,000 mg by mouth every 6 (six) hours as needed for moderate pain.   Yes Historical Provider, MD  amLODipine (NORVASC) 10 MG tablet Take 10  mg by mouth at bedtime.    Yes Historical Provider, MD  aspirin EC 81 MG tablet Take 81 mg by mouth at bedtime.    Yes Historical Provider, MD  calcium acetate (PHOSLO) 667 MG capsule Take 3 capsules (2,001 mg total) by mouth 3 (three) times daily with meals. 01/28/13  Yes Myriam Jacobson, PA-C  cloNIDine (CATAPRES) 0.1 MG tablet Take 0.1 mg by mouth daily.    Yes Historical Provider, MD  diphenhydrAMINE (BENADRYL) 25 mg capsule Take 25 mg by mouth daily as needed for allergies.   Yes Historical Provider, MD  escitalopram (LEXAPRO) 20 MG tablet Take 10 mg by mouth daily.    Yes Historical Provider, MD  insulin aspart (NOVOLOG) 100 UNIT/ML injection Inject 12 Units into the skin 2 (two) times daily with breakfast and lunch.    Yes Historical Provider, MD   labetalol (NORMODYNE) 300 MG tablet Take 300 mg by mouth daily.    Yes Historical Provider, MD  simvastatin (ZOCOR) 20 MG tablet Take 20 mg by mouth every Monday, Wednesday, and Friday.   Yes Historical Provider, MD  insulin glargine (LANTUS) 100 UNIT/ML injection Inject 53 Units into the skin at bedtime.     Historical Provider, MD  metoCLOPramide (REGLAN) 5 MG tablet Take 5 mg by mouth 4 (four) times daily.    Historical Provider, MD     Anti-infectives   None      Results for orders placed during the hospital encounter of 02/28/14 (from the past 48 hour(s))  I-STAT ARTERIAL BLOOD GAS, ED     Status: Abnormal   Collection Time    02/28/14 10:25 PM      Result Value Ref Range   pH, Arterial 7.389  7.350 - 7.450   pCO2 arterial 40.5  35.0 - 45.0 mmHg   pO2, Arterial 183.0 (*) 80.0 - 100.0 mmHg   Bicarbonate 24.5 (*) 20.0 - 24.0 mEq/L   TCO2 26  0 - 100 mmol/L   O2 Saturation 100.0     Acid-base deficit 1.0  0.0 - 2.0 mmol/L   Patient temperature 98.0 F     Collection site RADIAL, ALLEN'S TEST ACCEPTABLE     Drawn by RT     Sample type ARTERIAL    I-STAT CHEM 8, ED     Status: Abnormal   Collection Time    02/28/14 10:35 PM      Result Value Ref Range   Sodium 134 (*) 137 - 147 mEq/L   Potassium 5.0  3.7 - 5.3 mEq/L   Chloride 96  96 - 112 mEq/L   BUN 70 (*) 6 - 23 mg/dL   Creatinine, Ser 10.80 (*) 0.50 - 1.10 mg/dL   Glucose, Bld 254 (*) 70 - 99 mg/dL   Calcium, Ion 1.03 (*) 1.12 - 1.23 mmol/L   TCO2 23  0 - 100 mmol/L   Hemoglobin 7.1 (*) 12.0 - 15.0 g/dL   HCT 21.0 (*) 36.0 - 46.0 %  I-STAT CG4 LACTIC ACID, ED     Status: None   Collection Time    02/28/14 10:36 PM      Result Value Ref Range   Lactic Acid, Venous 1.22  0.5 - 2.2 mmol/L  COMPREHENSIVE METABOLIC PANEL     Status: Abnormal   Collection Time    03/01/14  6:10 AM      Result Value Ref Range   Sodium 136 (*) 137 - 147 mEq/L   Potassium 4.2  3.7 - 5.3  mEq/L   Chloride 95 (*) 96 - 112 mEq/L   CO2 26   19 - 32 mEq/L   Glucose, Bld 142 (*) 70 - 99 mg/dL   BUN 26 (*) 6 - 23 mg/dL   Comment: DELTA CHECK NOTED   Creatinine, Ser 4.94 (*) 0.50 - 1.10 mg/dL   Comment: DELTA CHECK NOTED   Calcium 9.4  8.4 - 10.5 mg/dL   Total Protein 8.3  6.0 - 8.3 g/dL   Albumin 3.3 (*) 3.5 - 5.2 g/dL   AST 14  0 - 37 U/L   ALT 14  0 - 35 U/L   Alkaline Phosphatase 64  39 - 117 U/L   Total Bilirubin 0.6  0.3 - 1.2 mg/dL   GFR calc non Af Amer 10 (*) >90 mL/min   GFR calc Af Amer 12 (*) >90 mL/min   Comment: (NOTE)     The eGFR has been calculated using the CKD EPI equation.     This calculation has not been validated in all clinical situations.     eGFR's persistently <90 mL/min signify possible Chronic Kidney     Disease.  CBC     Status: Abnormal   Collection Time    03/01/14  6:10 AM      Result Value Ref Range   WBC 13.7 (*) 4.0 - 10.5 K/uL   RBC 2.50 (*) 3.87 - 5.11 MIL/uL   Hemoglobin 7.7 (*) 12.0 - 15.0 g/dL   HCT 22.9 (*) 36.0 - 46.0 %   MCV 91.6  78.0 - 100.0 fL   MCH 30.8  26.0 - 34.0 pg   MCHC 33.6  30.0 - 36.0 g/dL   RDW 16.1 (*) 11.5 - 15.5 %   Platelets 264  150 - 400 K/uL  GLUCOSE, CAPILLARY     Status: Abnormal   Collection Time    03/01/14  8:00 AM      Result Value Ref Range   Glucose-Capillary 152 (*) 70 - 99 mg/dL   Comment 1 Documented in Chart     Comment 2 Notify RN    GLUCOSE, CAPILLARY     Status: None   Collection Time    03/01/14 12:01 PM      Result Value Ref Range   Glucose-Capillary 78  70 - 99 mg/dL    ROS:  Only positives in hpi for sob  Physical Exam: Filed Vitals:   03/01/14 1201  BP: 168/68  Pulse: 103  Temp:   Resp: 32     General:Alert w oriental Female , nad,  HEENT: [place] mmm Eyes:  nionicteric Neck: No jvd, supple Heart: RRR, no mur, rub, or gallop Lungs: faint L basilar rale.otherwise CTA  Abdomen:  Obese, bs =+. Soft, nt,nd Extremities:  No pedal edema Skin: no overt rash or pedal ulcer Neuro: alert, ox3, no acute deficits Dialysis  Access:  Pos. Bruit LUA [place]  Dialysis Orders: Center: [facility] on TTS . EDW 72 HD [place] 2.0k,2.25  Time 4 Heparin 2400. Access [email or web address]  BFR 400 DFR af1.5    Hectoral 1 mcg IV/HD aranesp 100u wkly  Venofer  184m x 5 ending 03/03/14 tfs 24%   Assessment/Plan 1. Volume overload / Pulmonary Edema = Etiology o f Early sign off hd and  excess vol hd  On admit and in am 3 hrs 2. ESRD -  TTS  HD ([facility]) k stable/ needs compliance with hd time 3. Hypertension/volume  - mildly htn  Vol uf with  hd/ clonidine./ norvasc meds 4. Anemia  - hgb 7.7 aranesp weekly hd venofer 100 mg  q hd through 5/28 and weekly/ prio [facility] evl with Myeloproliferative disees 5. Metabolic bone disease -   hectored on hd and binders 6. Noncompliance  With hd time- dw  Pt. 7. IDDM type 2  Ernest Haber, PA-C [place] [phone] 03/01/2014, 3:04 PM

## 2014-03-01 NOTE — ED Provider Notes (Signed)
I saw and evaluated the patient, reviewed the resident's note and I agree with the findings and plan.   EKG Interpretation   Date/Time:  Monday Feb 28 2014 22:01:24 EDT Ventricular Rate:  113 PR Interval:  145 QRS Duration: 80 QT Interval:  372 QTC Calculation: 510 R Axis:   87 Text Interpretation:  Sinus tachycardia Nonspecific T abnormalities,  lateral leads Prolonged QT interval No significant change since last  tracing Confirmed by Maryan Rued  MD, Jerimey Burridge ([id number]) on 03/01/2014 2:23:15 PM      Pt with resp distress from flash pulm edema came in on bipap with diffuse rales.  Pt has not missed dialysis but appears overloaded.  Improved with bipap and NTG gtt.  She was taken directly to dialysis and normal abg.    CRITICAL CARE Performed by: Isair Inabinet Total critical care time: 30 Critical care time was exclusive of separately billable procedures and treating other patients. Critical care was necessary to treat or prevent imminent or life-threatening deterioration. Critical care was time spent personally by me on the following activities: development of treatment plan with patient and/or surrogate as well as nursing, discussions with consultants, evaluation of patient's response to treatment, examination of patient, obtaining history from patient or surrogate, ordering and performing treatments and interventions, ordering and review of laboratory studies, ordering and review of radiographic studies, pulse oximetry and re-evaluation of patient's condition.   Blanchie Dessert, MD 03/01/14 1423

## 2014-03-01 NOTE — Consult Note (Signed)
I have seen and examined this patient and agree with plan as outlined by D. Zeyfang, PA-C.  Her pulmonary edema is a combination of nonadherence with dialysis (has not run her full treatment in 2 weeks and signed off 2 hours early on Saturday) and then extra volume in the form of a blood transfusion Saturday night.  She improved with urgent dialysis early this morning and will UF again tomorrow. Governor Rooks Jt Brabec,MD 03/01/2014 4:51 PM

## 2014-03-01 NOTE — Discharge Summary (Signed)
Physician Discharge Summary  [name] TGG:[id number] DOB: 01-Jan-1976 DOA: 02/28/2014  PCP: Tommy Medal, MD  Admit date: 02/28/2014 Discharge date: 03/01/2014  Time spent: 40 minutes  Recommendations for Outpatient Follow-up:  ESRD on HD T/Th/Sat([facility]),  -Received emergent HD last night secondary to volume overload; 4 L removed  -Patient to receive HD late in the evening; if stable discharge home  -Patient to continue receiving HD at [facility],  -Followup with PCP and nephrologist  Pulmonary edema -See ESRD  HTN  -Continue amlodipine 10 mg daily  -Continue Catapres 0.1 mg BID  -Continue labetalol 300 mg BID  -PCP to manage   Diabetes type 2  -Continue Lantus 63 units QHS  -Continue NovoLog 12 units QAC  -PCP to manage  Anemia secondary to ESRD  -Continue home O2 at 3-5 L via [place]  -Transfusions as required    Discharge Diagnoses:  Active Problems:   Respiratory failure with hypoxia   Acute pulmonary edema   Discharge Condition: Stable  Diet recommendation: Diabetic/renal  Filed Weights   03/01/14 0411  Weight: 75.4 kg (166 lb 3.6 oz)    History of present illness:  [name] is a 38 y.o. 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 She was noted on her CXR to have pulmonary edema and has been sent to emergent hemodialysis. Patient states that she has not had any chest pain. She has no fevers or chills. She has not had any increased edema. She has not missed any of her regular dialysis sessions. Patient states that she has no abdominal pain. She has not had any increased fluid consumption either. She denies having any cough she did have some pink frothy material from her mouth and nose in the ED per their notes. Patient was taken to emergent HD and 4 L were removed.  5/26 states on home O2 at 3-5 L  secondary to her anemia. States missed her HD appointment secondary to being sent to the emergency department to receive PRBC. Request that she be discharged tonight after her HD session, understanding that it would be late in the evening.      Procedures: 5/25 PCXR  -Enlargement of cardiac silhouette with pulmonary vascular congestion;Diffuse pulmonary edema.   Consultations: Dr. Donato Heinz (nephrology)   Antibiotics NA   Discharge Exam: Filed Vitals:   03/01/14 1119 03/01/14 1201 03/01/14 1500 03/01/14 2005  BP:  168/68  145/61  Pulse: 95 103 92 91  Temp: 98.1 F (36.7 C)  99.2 F (37.3 C) 97.8 F (36.6 C)  TempSrc: Oral  Oral Oral  Resp: 26 32 24 29  Height:      Weight:      SpO2: 99% 94% 93% 94%   General: A./O. x4, NAD, No acute respiratory distress  Lungs: Clear to auscultation bilaterally without wheezes or crackles  Cardiovascular: Tachycardic, Regular rhythm without murmur gallop or rub normal S1 and S2  Abdomen: Nontender, nondistended, soft, bowel sounds positive, no rebound, no ascites, no appreciable mass  Extremities: No significant cyanosis, clubbing, or edema bilateral lower extremities   Discharge Instructions     Medication List         acetaminophen 500 MG tablet  Commonly known as:  TYLENOL  Take 1,000 mg by mouth every 6 (six) hours as needed for moderate pain.     amLODipine 10 MG  tablet  Commonly known as:  NORVASC  Take 10 mg by mouth at bedtime.     aspirin EC 81 MG tablet  Take 81 mg by mouth at bedtime.     calcium acetate 667 MG capsule  Commonly known as:  PHOSLO  Take 3 capsules (2,001 mg total) by mouth 3 (three) times daily with meals.     cloNIDine 0.1 MG tablet  Commonly known as:  CATAPRES  Take 0.1 mg by mouth daily.     diphenhydrAMINE 25 mg capsule  Commonly known as:  BENADRYL  Take 25 mg by mouth daily as needed for allergies.     escitalopram 20 MG tablet  Commonly known as:  LEXAPRO  Take 10 mg  by mouth daily.     insulin aspart 100 UNIT/ML injection  Commonly known as:  novoLOG  Inject 12 Units into the skin 2 (two) times daily with breakfast and lunch.     insulin glargine 100 UNIT/ML injection  Commonly known as:  LANTUS  Inject 0.63 mLs (63 Units total) into the skin at bedtime.     labetalol 300 MG tablet  Commonly known as:  NORMODYNE  Take 300 mg by mouth daily.     metoCLOPramide 5 MG tablet  Commonly known as:  REGLAN  Take 5 mg by mouth 4 (four) times daily.     simvastatin 20 MG tablet  Commonly known as:  ZOCOR  Take 20 mg by mouth every Monday, Wednesday, and Friday.       Allergies  Allergen Reactions  . Ace Inhibitors Cough   Follow-up Information   Follow up with PANG,RICHARD, MD. Schedule an appointment as soon as possible for a visit in 1 week. (Followup hospital)    Specialty:  Internal Medicine   Contact information:   [address] [place] [address] [phone]        The results of significant diagnostics from this hospitalization (including imaging, microbiology, ancillary and laboratory) are listed below for reference.    Significant Diagnostic Studies: Dg Chest 2 View  02/26/2014   CLINICAL DATA:  Dyspnea.  EXAM: CHEST  2 VIEW  COMPARISON:  January 25, 2014.  FINDINGS: Stable mild cardiomegaly. No pneumothorax or pleural effusion is noted. No acute pulmonary disease is noted. Bony thorax is intact.  IMPRESSION: No acute cardiopulmonary abnormality seen.   Electronically Signed   By: Sabino Dick M.D.   On: 02/26/2014 12:52   Dg Chest Port 1 View  02/28/2014   CLINICAL DATA:  Respiratory distress, history hypertension, TB, renal and pancreatic transplant, diabetes  EXAM: PORTABLE CHEST - 1 VIEW  COMPARISON:  Portable exam 2211 hr compared to 02/26/2014  FINDINGS: Enlargement of cardiac silhouette with pulmonary vascular congestion.  Diffuse pulmonary edema.  No gross pneumothorax or acute osseous findings.   IMPRESSION: CHF.   Electronically Signed   By: Lavonia [name] M.D.   On: 02/28/2014 22:40    Microbiology: No results found for this or any previous visit (from the past 240 hour(s)).   Labs: Basic Metabolic Panel:  Recent Labs Lab 02/26/14 1212 02/28/14 2235 03/01/14 0610  NA 135* 134* 136*  K 4.1 5.0 4.2  CL 90* 96 95*  CO2 30  --  26  GLUCOSE 370* 254* 142*  BUN 22 70* 26*  CREATININE 4.52* 10.80* 4.94*  CALCIUM 9.1  --  9.4   Liver Function Tests:  Recent Labs Lab 03/01/14 0610  AST 14  ALT 14  ALKPHOS 64  BILITOT 0.6  PROT 8.3  ALBUMIN 3.3*   No results found for this basename: LIPASE, AMYLASE,  in the last 168 hours No results found for this basename: AMMONIA,  in the last 168 hours CBC:  Recent Labs Lab 02/26/14 1212 02/28/14 2235 03/01/14 0610  WBC 8.5  --  13.7*  NEUTROABS 7.4  --   --   HGB 7.0* 7.1* 7.7*  HCT 21.1* 21.0* 22.9*  MCV 90.6  --  91.6  PLT 288  --  264   Cardiac Enzymes: No results found for this basename: CKTOTAL, CKMB, CKMBINDEX, TROPONINI,  in the last 168 hours BNP: BNP (last 3 results) No results found for this basename: PROBNP,  in the last 8760 hours CBG:  Recent Labs Lab 03/01/14 0800 03/01/14 1201 03/01/14 1634  GLUCAP 152* 78 241*       Signed:  Dia [place], MD [facility] [phone] pager

## 2014-03-01 NOTE — Progress Notes (Signed)
Pt transferred from [facility] around 0400 (sent for emergent HD from ED), admitted to Rm/3s08. Pt comes from home with family. She is alert and oriented. No skin breakdown noted, however several old healed scars/scabs to back and legs. Left and Right great toenails have been removed, Band-Aid to Right great toe for scant amount of bleeding. Placed on telemetry, currently NSR. Oriented to room, instructed to call for assistance before getting out of bed. Resting comfortably at this time on BiPap, no s/s of resp distress. Will continue to monitor

## 2014-03-01 NOTE — Progress Notes (Addendum)
RT note: Removed bipap to 5L [place] due to pt stating I am breathing better. She is asking for water and breakfast. RT will continue to monitor Sats 97%. Pt stated that she wears 5L [place] at home day/night  [name]

## 2014-03-02 LAB — GLUCOSE, CAPILLARY
Glucose-Capillary: 120 mg/dL — ABNORMAL HIGH (ref 70–99)
Glucose-Capillary: 91 mg/dL (ref 70–99)

## 2014-03-02 LAB — RENAL FUNCTION PANEL
ALBUMIN: 2.9 g/dL — AB (ref 3.5–5.2)
BUN: 47 mg/dL — ABNORMAL HIGH (ref 6–23)
CHLORIDE: 89 meq/L — AB (ref 96–112)
CO2: 23 meq/L (ref 19–32)
Calcium: 9.2 mg/dL (ref 8.4–10.5)
Creatinine, Ser: 7.74 mg/dL — ABNORMAL HIGH (ref 0.50–1.10)
GFR, EST AFRICAN AMERICAN: 7 mL/min — AB (ref >90)
GFR, EST NON AFRICAN AMERICAN: 6 mL/min — AB (ref >90)
Glucose, Bld: 132 mg/dL — ABNORMAL HIGH (ref 70–99)
POTASSIUM: 4.6 meq/L (ref 3.7–5.3)
Phosphorus: 8.5 mg/dL — ABNORMAL HIGH (ref 2.3–4.6)
Sodium: 131 mEq/L — ABNORMAL LOW (ref 137–147)

## 2014-03-02 LAB — CBC
HCT: 18.9 % — ABNORMAL LOW (ref 36.0–46.0)
Hemoglobin: 6.3 g/dL — CL (ref 12.0–15.0)
MCH: 30.4 pg (ref 26.0–34.0)
MCHC: 33.3 g/dL (ref 30.0–36.0)
MCV: 91.3 fL (ref 78.0–100.0)
PLATELETS: 211 10*3/uL (ref 150–400)
RBC: 2.07 MIL/uL — AB (ref 3.87–5.11)
RDW: 16.4 % — AB (ref 11.5–15.5)
WBC: 7.6 10*3/uL (ref 4.0–10.5)

## 2014-03-02 LAB — MRSA PCR SCREENING: MRSA BY PCR: NEGATIVE

## 2014-03-02 LAB — PREPARE RBC (CROSSMATCH)

## 2014-03-02 NOTE — Discharge Instructions (Signed)
Hemodialysis °Dialysis is a procedure that replaces some of the work that healthy kidneys do. It is done when you lose about 85 90% of your kidney function and sometimes earlier if your symptoms may be improved by dialysis. During dialysis, wastes, salt, and extra water are removed from the blood; and the level of certain chemicals in the blood (such as potassium) is maintained. Hemodialysis is a type of dialysis in which a machine called a dialyzer is used to filter the blood. Hemodialysis is usually performed by a caregiver at a hospital or dialysis center 3 times a week for 3 5 hours during each visit. It may also be performed more frequently and for longer periods of time at home with training. °LET YOUR CAREGIVER KNOW ABOUT: °· Any allergies you have. °· All medications you are taking, including vitamins, herbs, eyedrops, and over-the-counter medications and creams. °· Any blood disorders you have had. °· Other health problems you have. °RISKS AND COMPLICATIONS °Generally, hemodialysis is a safe procedure. However, as with any procedure, complications can occur. Possible complications include: °· Low blood pressure (hypotension). °· Narrowing or ballooning of a blood vessel or bleeding at the site where blood is removed from the body and returned to the body (vascular access). °· An infection or blockage at the vascular access site. °BEFORE THE PROCEDURE °Before having hemodialysis for the first time, you will need surgery to create a site where blood can be removed from the body and returned to the body (vascular access). There are three types of vascular accesses: °· Arteriovenous fistula. This type of access is created when an artery and a vein (usually in the arm) are connected during surgery. The arteriovenous fistula takes 1 6 months to develop after surgery. °· Arteriovenous graft. This type of access is created when an artery and a vein in the arm are connected during surgery with a tube. An  arteriovenous graft can be used within 2 3 weeks of surgery. °· A venous catheter. To create this type of access, a thin, flexible tube (catheter) is placed in a large vein in your neck, chest, or groin. A venous catheter can be used right away. °PROCEDURE  °Hemodialysis is performed while you are sitting or reclining. During the procedure, you may sleep, read, watch television, or perform any other tasks that can be done while you are in this position. If you experience side effects or any other discomfort during the procedure, let your caregiver know. He or she may be able to make adjustments to help you feel more comfortable. °Your hemodialysis process may look something like this: °1. Your weight, blood pressure, pulse, and temperature will be measured. °2. The skin around your vascular access will be sterilized. °3. Your vascular access will be connected to the dialyzer. If your access is a fistula or graft, two needles will be inserted through the vascular access. The needles will be connected to a plastic tube that is connected to the dialyzer. They will be taped to your skin so that they do not move out of place. If your access is a catheter, it will be connected to a plastic tube that is connected to the dialyzer. °4. The dialysis machine will be turned on. This will cause your blood to flow to the dialyzer. The dialyzer will filter and clean your blood before returning it to your body. While the dialysis machine is working, your blood pressure and pulse will be checked several times. °5. Once dialysis is complete, your vascular   access will be disconnected from the dialyzer. If your access is a fistula or graft, the needles will be removed and a bandage (dressing) will be placed over the access. °AFTER THE PROCEDURE °· Your weight will be measured. °· Your blood may be tested to see whether your treatments are removing enough wastes. This is usually done once a month. °· You may experience or continue to  experience side effects, including: °· Dizziness. °· Muscle cramps. °· Nausea. °· Headaches. °· Feeling tired. Energy levels usually return to normal the day after the procedure. °· Itchiness. Your caregiver may be able to prescribe medication to relieve it. °· Achy or jittery legs. You may feel like kicking your legs. This sometimes causes sleeping problems. °· Allergic reaction to the chemicals used to sterilize the dialyzer. °Document Released: 01/18/2013 Document Reviewed: 01/18/2013 °[employer]® Patient Information ©2014 [employer]. ° °

## 2014-03-02 NOTE — Discharge Summary (Signed)
Physician Discharge Summary  [name] OZH:[id number] DOB: 1975/12/13 DOA: 02/28/2014  PCP: Tommy Medal, MD  Admit date: 02/28/2014 Discharge date: 03/02/2014  Time spent: 40 minutes  Discharge Diagnoses:  ESRD with noncompliance w/ HD Acute hypoxic resp failure with pulmonary edema HTN  Diabetes type 2 with ocular/vascular complications  Anemia secondary to ESRD   Discharge Condition: Stable  Diet recommendation: Diabetic/renal  Filed Weights   03/01/14 0411 03/02/14 1200  Weight: 75.4 kg (166 lb 3.6 oz) 78.9 kg (173 lb 15.1 oz)    History of present illness:  38 y.o. F w/ a Hx HTN, DM Retinopathy, S/P Combined Kidney/Pancreas transplant 2010 (Failed), Hx TB, endstage renal disease on HD T/Th/Sat ([place]), who presented to the ED with the acute onset of increased shortness of breath, after having signed off early from a HD appointment (a common practice of hers per report) and having been given a blood transfusion in the ED. She was noted on her CXR to have pulmonary edema.  Patient was taken to emergent HD and 4 L were removed.   Hospital Course:  ESRD on HD T/Th/Sat ([place]) -Received emergent HD secondary to volume overload; 4 L removed  -Patient to continue receiving HD at [facility] - warned of consequences of not complying w/ full HD txs -Followup with PCP and Nephrologist  Acute hypoxic resp failure with pulmonary edema -See ESRD  HTN  -Continue amlodipine 10 mg daily  -Continue Catapres 0.1 mg BID  -Continue labetalol 300 mg BID  -comply w/ volume removal at HD sessions  Diabetes type 2  -Continue Lantus 63 units QHS  -Continue NovoLog 12 units QAC  -PCP to manage  Anemia secondary to ESRD  -Continue home O2 at 3-5 L via [place]  -Transfusions as required -transfused 2U PRBC this admit   Procedures:  5/25 PCXR  -Enlargement of cardiac silhouette with pulmonary vascular congestion;Diffuse pulmonary edema.  Consultations: Dr. Donato Heinz  (nephrology)  Antibiotics NA  Discharge Exam: Filed Vitals:   03/02/14 1600 03/02/14 1630 03/02/14 1642 03/02/14 1657  BP: 146/45 125/52 125/62 137/61  Pulse: 79 80 79 79  Temp: 97.7 F (36.5 C)  97.4 F (36.3 C) 97.7 F (36.5 C)  TempSrc:   Oral Oral  Resp: 20 18 26 22   Height:      Weight:      SpO2: 100%  100%    General: NAD, No acute respiratory distress  Lungs: Clear to auscultation bilaterally without wheezes or crackles  Cardiovascular: Regular rate and rhythm without murmur gallop or rub normal S1 and S2  Abdomen: Nontender, nondistended, soft, bowel sounds positive, no rebound, no ascites, no appreciable mass  Extremities: No significant cyanosis, clubbing, or edema bilateral lower extremities    Medication List         acetaminophen 500 MG tablet  Commonly known as:  TYLENOL  Take 1,000 mg by mouth every 6 (six) hours as needed for moderate pain.     amLODipine 10 MG tablet  Commonly known as:  NORVASC  Take 10 mg by mouth at bedtime.     aspirin EC 81 MG tablet  Take 81 mg by mouth at bedtime.     calcium acetate 667 MG capsule  Commonly known as:  PHOSLO  Take 3 capsules (2,001 mg total) by mouth 3 (three) times daily with meals.     cloNIDine 0.1 MG tablet  Commonly known as:  CATAPRES  Take 0.1 mg by mouth daily.  diphenhydrAMINE 25 mg capsule  Commonly known as:  BENADRYL  Take 25 mg by mouth daily as needed for allergies.     escitalopram 20 MG tablet  Commonly known as:  LEXAPRO  Take 10 mg by mouth daily.     insulin aspart 100 UNIT/ML injection  Commonly known as:  novoLOG  Inject 12 Units into the skin 2 (two) times daily with breakfast and lunch.     insulin glargine 100 UNIT/ML injection  Commonly known as:  LANTUS  Inject 0.63 mLs (63 Units total) into the skin at bedtime.     labetalol 300 MG tablet  Commonly known as:  NORMODYNE  Take 300 mg by mouth daily.     metoCLOPramide 5 MG tablet  Commonly known as:  REGLAN   Take 5 mg by mouth 4 (four) times daily.     simvastatin 20 MG tablet  Commonly known as:  ZOCOR  Take 20 mg by mouth every Monday, Wednesday, and Friday.       Allergies  Allergen Reactions  . Ace Inhibitors Cough   Follow-up Information   Follow up with PANG,RICHARD, MD. Schedule an appointment as soon as possible for a visit in 1 week. (Followup hospital)    Specialty:  Internal Medicine   Contact information:   [address] [place] [address] [phone]      Microbiology: Recent Results (from the past 240 hour(s))  MRSA PCR SCREENING     Status: None   Collection Time    03/02/14  1:13 AM      Result Value Ref Range Status   MRSA by PCR NEGATIVE  NEGATIVE Final   Comment:            The GeneXpert MRSA Assay (FDA     approved for NASAL specimens     only), is one component of a     comprehensive MRSA colonization     surveillance program. It is not     intended to diagnose MRSA     infection nor to guide or     monitor treatment for     MRSA infections.    Labs: Basic Metabolic Panel:  Recent Labs Lab 02/26/14 1212 02/28/14 2235 03/01/14 0610 03/02/14 1347  NA 135* 134* 136* 131*  K 4.1 5.0 4.2 4.6  CL 90* 96 95* 89*  CO2 30  --  26 23  GLUCOSE 370* 254* 142* 132*  BUN 22 70* 26* 47*  CREATININE 4.52* 10.80* 4.94* 7.74*  CALCIUM 9.1  --  9.4 9.2  PHOS  --   --   --  8.5*   Liver Function Tests:  Recent Labs Lab 03/01/14 0610 03/02/14 1347  AST 14  --   ALT 14  --   ALKPHOS 64  --   BILITOT 0.6  --   PROT 8.3  --   ALBUMIN 3.3* 2.9*   CBC:  Recent Labs Lab 02/26/14 1212 02/28/14 2235 03/01/14 0610 03/02/14 1347  WBC 8.5  --  13.7* 7.6  NEUTROABS 7.4  --   --   --   HGB 7.0* 7.1* 7.7* 6.3*  HCT 21.1* 21.0* 22.9* 18.9*  MCV 90.6  --  91.6 91.3  PLT 288  --  264 211   CBG:  Recent Labs Lab 03/01/14 0800 03/01/14 1201 03/01/14 1634 03/02/14 0808 03/02/14 1114  GLUCAP 152* 78 241* 120* 91    Signed:  Cherene Altes, MD [facility] For Consults/Admissions -  Flow Manager - [phone] Office  [phone] Pager [phone]  On-Call/Text Page:      [email or web address]      password [facility]

## 2014-03-02 NOTE — Procedures (Addendum)
Patient was seen on dialysis and the procedure was supervised. BFR 400 Via LUE AVG BP is 138/62.  Patient appears to be tolerating treatment well Will try to transfuse 2uPRBC's with HD today and then should be ok for dc if this is delayed will transfuse with HD tomorrow.

## 2014-03-02 NOTE — Progress Notes (Addendum)
Patient asking for extra fluid on tray.  Again explained importance of adherence to fluid restriction and low sodium diet.  Consuming potato chips in her room.  Offered a list of foods high in sodium to patient, which she refused.  ? Amt. of support she is able to get when at home to avoid another hospitalization and improve her quality of life.  Lives with her husband and 7 y-o child.  Appears to have high regard for son. Explained medications and stressed importance of adherence to HD schedule.

## 2014-03-02 NOTE — Progress Notes (Signed)
Patient ID: [name], female   DOB: 1975/11/20, 38 y.o.   MRN: [id number] S:feeling better O:BP 117/62  Pulse 80  Temp(Src) 97.5 F (36.4 C) (Oral)  Resp 24  Ht 5\' 3"  (1.6 m)  Wt 75.4 kg (166 lb 3.6 oz)  BMI 29.45 kg/m2  SpO2 99%  LMP 02/28/2014  Intake/Output Summary (Last 24 hours) at 03/02/14 1033 Last data filed at 03/02/14 0930  Gross per 24 hour  Intake    550 ml  Output      0 ml  Net    550 ml   Intake/Output: I/O last 3 completed shifts: In: -  Out: 4000 [Other:4000]  Intake/Output this shift:  Total I/O In: 550 [P.O.:550] Out: -  Weight change:  Gen:WD WN in NAD CVS:no rub Resp:cta DPO:EUMPNT Ext:tr edema, LUE AVG +T/B   Recent Labs Lab 02/26/14 1212 02/28/14 2235 03/01/14 0610  NA 135* 134* 136*  K 4.1 5.0 4.2  CL 90* 96 95*  CO2 30  --  26  GLUCOSE 370* 254* 142*  BUN 22 70* 26*  CREATININE 4.52* 10.80* 4.94*  ALBUMIN  --   --  3.3*  CALCIUM 9.1  --  9.4  AST  --   --  14  ALT  --   --  14   Liver Function Tests:  Recent Labs Lab 03/01/14 0610  AST 14  ALT 14  ALKPHOS 64  BILITOT 0.6  PROT 8.3  ALBUMIN 3.3*   No results found for this basename: LIPASE, AMYLASE,  in the last 168 hours No results found for this basename: AMMONIA,  in the last 168 hours CBC:  Recent Labs Lab 02/26/14 1212 02/28/14 2235 03/01/14 0610  WBC 8.5  --  13.7*  NEUTROABS 7.4  --   --   HGB 7.0* 7.1* 7.7*  HCT 21.1* 21.0* 22.9*  MCV 90.6  --  91.6  PLT 288  --  264   Cardiac Enzymes: No results found for this basename: CKTOTAL, CKMB, CKMBINDEX, TROPONINI,  in the last 168 hours CBG:  Recent Labs Lab 03/01/14 0800 03/01/14 1201 03/01/14 1634 03/02/14 0808  GLUCAP 152* 78 241* 120*    Iron Studies: No results found for this basename: IRON, TIBC, TRANSFERRIN, FERRITIN,  in the last 72 hours Studies/Results: Dg Chest Port 1 View  02/28/2014   CLINICAL DATA:  Respiratory distress, history hypertension, TB, renal and pancreatic transplant,  diabetes  EXAM: PORTABLE CHEST - 1 VIEW  COMPARISON:  Portable exam 2211 hr compared to 02/26/2014  FINDINGS: Enlargement of cardiac silhouette with pulmonary vascular congestion.  Diffuse pulmonary edema.  No gross pneumothorax or acute osseous findings.  IMPRESSION: CHF.   Electronically Signed   By: Lavonia [name] M.D.   On: 02/28/2014 22:40   . acidophilus  1 capsule Oral Daily  . amLODipine  10 mg Oral QHS  . antiseptic oral rinse  15 mL Mouth Rinse q12n4p  . aspirin EC  81 mg Oral QHS  . calcium acetate  2,001 mg Oral TID WC  . cloNIDine  0.1 mg Oral BID  . docusate sodium  100 mg Oral BID  . escitalopram  10 mg Oral Daily  . heparin  5,000 Units Subcutaneous 3 times per day  . insulin aspart  12 Units Subcutaneous TID WC  . insulin glargine  63 Units Subcutaneous QHS  . labetalol  300 mg Oral BID  . metoCLOPramide  5 mg Oral QID  . multivitamin  1 tablet  Oral Daily  . pantoprazole  40 mg Oral Daily  . simvastatin  20 mg Oral Q M,W,F  . sodium chloride  3 mL Intravenous Q12H  . sodium chloride  3 mL Intravenous Q12H    BMET    Component Value Date/Time   NA 136* 03/01/2014 0610   NA 138 07/08/2012 1249   K 4.2 03/01/2014 0610   K 4.0 07/08/2012 1249   CL 95* 03/01/2014 0610   CL 110* 07/08/2012 1249   CO2 26 03/01/2014 0610   CO2 16* 07/08/2012 1249   GLUCOSE 142* 03/01/2014 0610   GLUCOSE 147* 07/08/2012 1249   BUN 26* 03/01/2014 0610   BUN 49.0* 07/08/2012 1249   CREATININE 4.94* 03/01/2014 0610   CREATININE 5.2* 07/08/2012 1249   CALCIUM 9.4 03/01/2014 0610   CALCIUM 7.8* 07/08/2012 1249   GFRNONAA 10* 03/01/2014 0610   GFRAA 12* 03/01/2014 0610   CBC    Component Value Date/Time   WBC 13.7* 03/01/2014 0610   WBC 3.4* 08/10/2012 0806   RBC 2.50* 03/01/2014 0610   RBC 2.85* 08/10/2012 0806   RBC 2.49* 06/10/2012 0230   HGB 7.7* 03/01/2014 0610   HGB 8.3* 08/10/2012 0806   HCT 22.9* 03/01/2014 0610   HCT 24.9* 08/10/2012 0806   PLT 264 03/01/2014 0610   PLT 262 08/10/2012 0806   MCV  91.6 03/01/2014 0610   MCV 87.4 08/10/2012 0806   MCH 30.8 03/01/2014 0610   MCH 29.1 08/10/2012 0806   MCHC 33.6 03/01/2014 0610   MCHC 33.3 08/10/2012 0806   RDW 16.1* 03/01/2014 0610   RDW 15.3* 08/10/2012 0806   LYMPHSABS 0.7 02/26/2014 1212   LYMPHSABS 1.1 08/10/2012 0806   MONOABS 0.3 02/26/2014 1212   MONOABS 0.5 08/10/2012 0806   EOSABS 0.1 02/26/2014 1212   EOSABS 0.1 08/10/2012 0806   BASOSABS 0.0 02/26/2014 1212   BASOSABS 0.0 08/10/2012 0806     Dialysis Orders: Center: [facility] on TTS .  EDW 72 HD Bath 2.0k,2.25 Time 4 Heparin 2400. Access LUAAVGG BFR 400 DFR af1.5  Hectoral 1 mcg IV/HD aranesp 100u wkly Venofer 100mg  x 5 ending 03/03/14 tfs 24%  Assessment/Plan  1. Volume overload / Pulmonary Edema = related to nonadherence with HD and fluid restriction.  Plan for another abbreviated session today before discharge home and educated the patient on the risks of hospitalization and/or sudden death with noncompliance with dialysis.   2. ESRD - TTS HD ([facility]) k stable/ needs compliance with hd time. 3. Hypertension/volume - mildly htn Vol uf with hd/ clonidine./ norvasc meds.  Stressed fluid restriction 4. Anemia - hgb 7.7 aranesp weekly hd venofer 100 mg q hd through 5/28 and weekly/ prio [facility] evl with Myeloproliferative disees 5. Metabolic bone disease - hectored on hd and binders 6. Noncompliance With hd time- dw Pt. 7. IDDM type 2 8. Dispo- for d/c after HD today and f/u with outpt HD tomorrow.  [place]

## 2014-03-03 LAB — TYPE AND SCREEN
ABO/RH(D): AB POS
ANTIBODY SCREEN: NEGATIVE
UNIT DIVISION: 0
Unit division: 0

## 2014-04-18 ENCOUNTER — Encounter (HOSPITAL_COMMUNITY): Payer: Self-pay | Admitting: Emergency Medicine

## 2014-04-18 ENCOUNTER — Emergency Department (HOSPITAL_COMMUNITY): Payer: Medicare Other

## 2014-04-18 ENCOUNTER — Inpatient Hospital Stay (HOSPITAL_COMMUNITY)
Admission: EM | Admit: 2014-04-18 | Discharge: 2014-04-23 | DRG: 640 | Disposition: A | Discharge status: Home / Self Care | Payer: Medicare Other | Attending: Internal Medicine | Admitting: Internal Medicine

## 2014-04-18 DIAGNOSIS — Z992 Dependence on renal dialysis: Secondary | ICD-10-CM | POA: Diagnosis not present

## 2014-04-18 DIAGNOSIS — Z8614 Personal history of Methicillin resistant Staphylococcus aureus infection: Secondary | ICD-10-CM | POA: Diagnosis not present

## 2014-04-18 DIAGNOSIS — Z9119 Patient's noncompliance with other medical treatment and regimen: Secondary | ICD-10-CM

## 2014-04-18 DIAGNOSIS — F3289 Other specified depressive episodes: Secondary | ICD-10-CM | POA: Diagnosis present

## 2014-04-18 DIAGNOSIS — Z91199 Patient's noncompliance with other medical treatment and regimen due to unspecified reason: Secondary | ICD-10-CM | POA: Diagnosis not present

## 2014-04-18 DIAGNOSIS — Z800 Family history of malignant neoplasm of digestive organs: Secondary | ICD-10-CM

## 2014-04-18 DIAGNOSIS — Z9115 Patient's noncompliance with renal dialysis: Secondary | ICD-10-CM

## 2014-04-18 DIAGNOSIS — Z9483 Pancreas transplant status: Secondary | ICD-10-CM

## 2014-04-18 DIAGNOSIS — Z794 Long term (current) use of insulin: Secondary | ICD-10-CM | POA: Diagnosis not present

## 2014-04-18 DIAGNOSIS — Z9981 Dependence on supplemental oxygen: Secondary | ICD-10-CM

## 2014-04-18 DIAGNOSIS — E8779 Other fluid overload: Secondary | ICD-10-CM | POA: Diagnosis present

## 2014-04-18 DIAGNOSIS — J9620 Acute and chronic respiratory failure, unspecified whether with hypoxia or hypercapnia: Secondary | ICD-10-CM | POA: Diagnosis present

## 2014-04-18 DIAGNOSIS — Z8543 Personal history of malignant neoplasm of ovary: Secondary | ICD-10-CM | POA: Diagnosis not present

## 2014-04-18 DIAGNOSIS — E785 Hyperlipidemia, unspecified: Secondary | ICD-10-CM | POA: Diagnosis present

## 2014-04-18 DIAGNOSIS — R7881 Bacteremia: Secondary | ICD-10-CM | POA: Diagnosis present

## 2014-04-18 DIAGNOSIS — Z833 Family history of diabetes mellitus: Secondary | ICD-10-CM

## 2014-04-18 DIAGNOSIS — Z940 Kidney transplant status: Secondary | ICD-10-CM | POA: Diagnosis not present

## 2014-04-18 DIAGNOSIS — E11319 Type 2 diabetes mellitus with unspecified diabetic retinopathy without macular edema: Secondary | ICD-10-CM | POA: Diagnosis present

## 2014-04-18 DIAGNOSIS — E871 Hypo-osmolality and hyponatremia: Secondary | ICD-10-CM | POA: Diagnosis present

## 2014-04-18 DIAGNOSIS — Z91158 Patient's noncompliance with renal dialysis for other reason: Secondary | ICD-10-CM

## 2014-04-18 DIAGNOSIS — D649 Anemia, unspecified: Secondary | ICD-10-CM

## 2014-04-18 DIAGNOSIS — N186 End stage renal disease: Secondary | ICD-10-CM

## 2014-04-18 DIAGNOSIS — E1139 Type 2 diabetes mellitus with other diabetic ophthalmic complication: Secondary | ICD-10-CM | POA: Diagnosis present

## 2014-04-18 DIAGNOSIS — E1129 Type 2 diabetes mellitus with other diabetic kidney complication: Secondary | ICD-10-CM | POA: Diagnosis present

## 2014-04-18 DIAGNOSIS — Z888 Allergy status to other drugs, medicaments and biological substances status: Secondary | ICD-10-CM | POA: Diagnosis not present

## 2014-04-18 DIAGNOSIS — A4901 Methicillin susceptible Staphylococcus aureus infection, unspecified site: Secondary | ICD-10-CM | POA: Diagnosis present

## 2014-04-18 DIAGNOSIS — J189 Pneumonia, unspecified organism: Secondary | ICD-10-CM | POA: Diagnosis present

## 2014-04-18 DIAGNOSIS — N2581 Secondary hyperparathyroidism of renal origin: Secondary | ICD-10-CM | POA: Diagnosis present

## 2014-04-18 DIAGNOSIS — R509 Fever, unspecified: Secondary | ICD-10-CM | POA: Diagnosis present

## 2014-04-18 DIAGNOSIS — E1165 Type 2 diabetes mellitus with hyperglycemia: Secondary | ICD-10-CM | POA: Diagnosis present

## 2014-04-18 DIAGNOSIS — F329 Major depressive disorder, single episode, unspecified: Secondary | ICD-10-CM | POA: Diagnosis present

## 2014-04-18 DIAGNOSIS — I120 Hypertensive chronic kidney disease with stage 5 chronic kidney disease or end stage renal disease: Secondary | ICD-10-CM | POA: Diagnosis present

## 2014-04-18 DIAGNOSIS — E119 Type 2 diabetes mellitus without complications: Secondary | ICD-10-CM | POA: Diagnosis present

## 2014-04-18 DIAGNOSIS — N039 Chronic nephritic syndrome with unspecified morphologic changes: Secondary | ICD-10-CM

## 2014-04-18 DIAGNOSIS — N189 Chronic kidney disease, unspecified: Secondary | ICD-10-CM

## 2014-04-18 DIAGNOSIS — B9561 Methicillin susceptible Staphylococcus aureus infection as the cause of diseases classified elsewhere: Secondary | ICD-10-CM | POA: Diagnosis present

## 2014-04-18 DIAGNOSIS — Z8611 Personal history of tuberculosis: Secondary | ICD-10-CM | POA: Diagnosis not present

## 2014-04-18 DIAGNOSIS — D631 Anemia in chronic kidney disease: Secondary | ICD-10-CM | POA: Diagnosis present

## 2014-04-18 DIAGNOSIS — E875 Hyperkalemia: Principal | ICD-10-CM | POA: Diagnosis present

## 2014-04-18 DIAGNOSIS — I10 Essential (primary) hypertension: Secondary | ICD-10-CM | POA: Diagnosis present

## 2014-04-18 HISTORY — DX: Kidney transplant status: Z94.0

## 2014-04-18 HISTORY — DX: Pancreas transplant status: Z94.83

## 2014-04-18 HISTORY — DX: End stage renal disease: N18.6

## 2014-04-18 HISTORY — DX: Dependence on renal dialysis: Z99.2

## 2014-04-18 LAB — CBC
HCT: 21.1 % — ABNORMAL LOW (ref 36.0–46.0)
HEMATOCRIT: 22.5 % — AB (ref 36.0–46.0)
Hemoglobin: 7.5 g/dL — ABNORMAL LOW (ref 12.0–15.0)
Hemoglobin: 7.2 g/dL — ABNORMAL LOW (ref 12.0–15.0)
MCH: 31 pg (ref 26.0–34.0)
MCH: 31.2 pg (ref 26.0–34.0)
MCHC: 33.3 g/dL (ref 30.0–36.0)
MCHC: 34.1 g/dL (ref 30.0–36.0)
MCV: 93 fL (ref 78.0–100.0)
MCV: 91.3 fL (ref 78.0–100.0)
Platelets: 238 10*3/uL (ref 150–400)
Platelets: 239 10*3/uL (ref 150–400)
RBC: 2.42 MIL/uL — ABNORMAL LOW (ref 3.87–5.11)
RBC: 2.31 MIL/uL — ABNORMAL LOW (ref 3.87–5.11)
RDW: 16.3 % — ABNORMAL HIGH (ref 11.5–15.5)
RDW: 16.3 % — AB (ref 11.5–15.5)
WBC: 11.7 10*3/uL — AB (ref 4.0–10.5)
WBC: 11.1 10*3/uL — ABNORMAL HIGH (ref 4.0–10.5)

## 2014-04-18 LAB — CREATININE, SERUM
Creatinine, Ser: 8.41 mg/dL — ABNORMAL HIGH (ref 0.50–1.10)
GFR calc Af Amer: 6 mL/min — ABNORMAL LOW (ref >90)
GFR calc non Af Amer: 5 mL/min — ABNORMAL LOW (ref >90)

## 2014-04-18 LAB — BASIC METABOLIC PANEL
Anion gap: 21 — ABNORMAL HIGH (ref 5–15)
BUN: 52 mg/dL — AB (ref 6–23)
CHLORIDE: 82 meq/L — AB (ref 96–112)
CO2: 22 mEq/L (ref 19–32)
CREATININE: 8.36 mg/dL — AB (ref 0.50–1.10)
Calcium: 9 mg/dL (ref 8.4–10.5)
GFR calc Af Amer: 6 mL/min — ABNORMAL LOW (ref >90)
GFR calc non Af Amer: 5 mL/min — ABNORMAL LOW (ref >90)
Glucose, Bld: 193 mg/dL — ABNORMAL HIGH (ref 70–99)
Potassium: >7.7 mEq/L (ref 3.7–5.3)
Sodium: 125 mEq/L — ABNORMAL LOW (ref 137–147)

## 2014-04-18 LAB — I-STAT TROPONIN, ED: Troponin i, poc: 0.02 ng/mL (ref 0.00–0.08)

## 2014-04-18 LAB — PRO B NATRIURETIC PEPTIDE: Pro B Natriuretic peptide (BNP): >70000 pg/mL — ABNORMAL HIGH (ref 0–125)

## 2014-04-18 LAB — TROPONIN I: Troponin I: <0.3 ng/mL (ref <0.30)

## 2014-04-18 MED ORDER — HYDRALAZINE HCL 20 MG/ML IJ SOLN
10.0000 mg | INTRAMUSCULAR | Status: AC | PRN
  Administered 2014-04-18: 10 mg via INTRAVENOUS
  Filled 2014-04-18 (×2): qty 0.5

## 2014-04-18 MED ORDER — SODIUM CHLORIDE 0.9 % IV SOLN
1.0000 g | Freq: Once | INTRAVENOUS | Status: AC
  Administered 2014-04-18: 1 g via INTRAVENOUS
  Filled 2014-04-18: qty 10

## 2014-04-18 MED ORDER — CLONIDINE HCL 0.1 MG PO TABS
0.1000 mg | ORAL_TABLET | Freq: Every day | ORAL | Status: DC
  Administered 2014-04-19 – 2014-04-20 (×2): 0.1 mg via ORAL
  Filled 2014-04-18 (×2): qty 1

## 2014-04-18 MED ORDER — ESCITALOPRAM OXALATE 10 MG PO TABS: 10.0000 mg | ORAL_TABLET | Freq: Every day | ORAL | Status: DC

## 2014-04-18 MED ORDER — SODIUM CHLORIDE 0.9 % IJ SOLN
3.0000 mL | INTRAMUSCULAR | Status: DC | PRN
Start: 2014-04-18 — End: 2014-04-23
  Administered 2014-04-23: 3 mL via INTRAVENOUS

## 2014-04-18 MED ORDER — AMLODIPINE BESYLATE 10 MG PO TABS
10.0000 mg | ORAL_TABLET | Freq: Every day | ORAL | Status: DC
  Administered 2014-04-19 – 2014-04-20 (×2): 10 mg via ORAL
  Filled 2014-04-18 (×2): qty 1

## 2014-04-18 MED ORDER — METOCLOPRAMIDE HCL 5 MG PO TABS
5.0000 mg | ORAL_TABLET | Freq: Four times a day (QID) | ORAL | Status: DC
  Administered 2014-04-19 – 2014-04-23 (×16): 5 mg via ORAL
  Filled 2014-04-18 (×21): qty 1

## 2014-04-18 MED ORDER — SODIUM CHLORIDE 0.9 % IJ SOLN
3.0000 mL | Freq: Two times a day (BID) | INTRAMUSCULAR | Status: DC
  Administered 2014-04-19 – 2014-04-23 (×8): 3 mL via INTRAVENOUS

## 2014-04-18 MED ORDER — DEXTROSE 50 % IV SOLN
25.0000 mL | Freq: Once | INTRAVENOUS | Status: AC
  Administered 2014-04-18: 25 mL via INTRAVENOUS
  Filled 2014-04-18: qty 50

## 2014-04-18 MED ORDER — CLONIDINE HCL 0.1 MG PO TABS: 0.1000 mg | ORAL_TABLET | Freq: Every day | ORAL | Status: DC

## 2014-04-18 MED ORDER — ALBUTEROL SULFATE (2.5 MG/3ML) 0.083% IN NEBU
5.0000 mg | INHALATION_SOLUTION | RESPIRATORY_TRACT | Status: DC
  Administered 2014-04-18: 5 mg via RESPIRATORY_TRACT
  Filled 2014-04-18: qty 6

## 2014-04-18 MED ORDER — SODIUM CHLORIDE 0.9 % IJ SOLN
3.0000 mL | Freq: Two times a day (BID) | INTRAMUSCULAR | Status: DC
  Administered 2014-04-20 – 2014-04-23 (×4): 3 mL via INTRAVENOUS

## 2014-04-18 MED ORDER — HEPARIN SODIUM (PORCINE) 5000 UNIT/ML IJ SOLN
5000.0000 [IU] | Freq: Three times a day (TID) | INTRAMUSCULAR | Status: DC
  Administered 2014-04-19 – 2014-04-23 (×13): 5000 [IU] via SUBCUTANEOUS
  Filled 2014-04-18 (×18): qty 1

## 2014-04-18 MED ORDER — INSULIN ASPART 100 UNIT/ML IV SOLN
5.0000 [IU] | Freq: Once | INTRAVENOUS | Status: AC
  Administered 2014-04-18: 5 [IU] via INTRAVENOUS
  Filled 2014-04-18: qty 0.05

## 2014-04-18 MED ORDER — AMLODIPINE BESYLATE 10 MG PO TABS
10.0000 mg | ORAL_TABLET | Freq: Every day | ORAL | Status: DC
  Filled 2014-04-18: qty 1

## 2014-04-18 MED ORDER — INSULIN ASPART 100 UNIT/ML ~~LOC~~ SOLN
12.0000 [IU] | Freq: Two times a day (BID) | SUBCUTANEOUS | Status: DC
  Administered 2014-04-19: 12 [IU] via SUBCUTANEOUS

## 2014-04-18 MED ORDER — CALCIUM ACETATE 667 MG PO CAPS
2001.0000 mg | ORAL_CAPSULE | Freq: Three times a day (TID) | ORAL | Status: DC
  Administered 2014-04-19 – 2014-04-23 (×9): 2001 mg via ORAL
  Filled 2014-04-18 (×16): qty 3

## 2014-04-18 MED ORDER — ESCITALOPRAM OXALATE 10 MG PO TABS
10.0000 mg | ORAL_TABLET | Freq: Every day | ORAL | Status: DC
  Administered 2014-04-19 – 2014-04-23 (×5): 10 mg via ORAL
  Filled 2014-04-18 (×5): qty 1

## 2014-04-18 MED ORDER — SODIUM CHLORIDE 0.9 % IV SOLN: 250.0000 mL | INTRAVENOUS | Status: DC | PRN

## 2014-04-18 MED ORDER — HEPARIN SODIUM (PORCINE) 1000 UNIT/ML IJ SOLN
2400.0000 [IU] | Freq: Once | INTRAMUSCULAR | Status: DC
  Filled 2014-04-18: qty 2.4

## 2014-04-18 MED ORDER — DIPHENHYDRAMINE HCL 25 MG PO CAPS: 25.0000 mg | ORAL_CAPSULE | Freq: Every day | ORAL | Status: DC | PRN

## 2014-04-18 MED ORDER — LABETALOL HCL 300 MG PO TABS
300.0000 mg | ORAL_TABLET | Freq: Every day | ORAL | Status: DC
  Administered 2014-04-19 – 2014-04-20 (×2): 300 mg via ORAL
  Filled 2014-04-18 (×2): qty 1

## 2014-04-18 MED ORDER — ACETAMINOPHEN 500 MG PO TABS
1000.0000 mg | ORAL_TABLET | Freq: Four times a day (QID) | ORAL | Status: DC | PRN
  Administered 2014-04-18 – 2014-04-22 (×4): 1000 mg via ORAL
  Filled 2014-04-18 (×6): qty 2

## 2014-04-18 MED ORDER — SIMVASTATIN 20 MG PO TABS
20.0000 mg | ORAL_TABLET | ORAL | Status: DC
  Administered 2014-04-20 – 2014-04-22 (×2): 20 mg via ORAL
  Filled 2014-04-18 (×2): qty 1

## 2014-04-18 MED ORDER — ASPIRIN 325 MG PO TABS
325.0000 mg | ORAL_TABLET | Freq: Once | ORAL | Status: AC
  Administered 2014-04-18: 325 mg via ORAL
  Filled 2014-04-18: qty 1

## 2014-04-18 MED ORDER — INSULIN GLARGINE 100 UNIT/ML ~~LOC~~ SOLN
63.0000 [IU] | Freq: Every day | SUBCUTANEOUS | Status: DC
  Administered 2014-04-19: 63 [IU] via SUBCUTANEOUS
  Filled 2014-04-18 (×2): qty 0.63

## 2014-04-18 NOTE — ED Notes (Signed)
To ED for eval of SOB. Pt dialysis pt. Last dialysis Saturday. Pain all over, tightness in shoulders. Denies cp

## 2014-04-18 NOTE — Procedures (Signed)
I was present at this dialysis session, have reviewed the session itself and made  appropriate changes  Rob Jasey Cortez MD (pgr) [phone]    (c) [phone] 04/18/2014, 10:09 PM   

## 2014-04-18 NOTE — Progress Notes (Signed)
Pt has been c/o cold during HD tx, warm blanketsx5 has been provided to pt. With 1:20 remaining with tx pt requested foran early termination of HD against medical advice. Nephrologist Dr. Melvia Heaps made aware of pt's situation and ok to terminate tx. Pt alert and oriented in no acute distress. Denies any chest pain at this time. Dr Ernestina Patches made aware. Reports given to [facility] in [place].

## 2014-04-18 NOTE — H&P (Addendum)
Hospitalist Admission History and Physical  Patient name: [name] Medical record number: [id number] Date of birth: 1976-06-12 Age: 38 y.o. Gender: female  Primary Care Provider: Tommy Medal, MD  Chief Complaint: hyperkalemia, dyspnea   History of Present Illness:This is a 38 y.o. 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 The patient notes abdomen dated May 26 through May 27 for volume overload in the setting of dialysis noncompliance. At approximately 4 L removed. Patient states that since hospital discharge, she has been compliant with hemodialysis. Received dialysis last week Thursday as well Saturday. Over the weekend patient became progressively weak and short of breath. Denies any chest pain, nausea, vomiting. No diarrhea. Has had some mild chills at home. Denies taking any new medicines. Reports she has been compliant with her medication regimen. Denies any recent strenuous activity. Blood sugars have been stable. Came to the ER today because of worsening weakness. On presentation, MAXIMUM TEMPERATURE 99.3, blood pressure in the 119E to 174Y systolic. Satting greater than 95% on 5 L. White blood cell count 11.7, hemoglobin 7.5, sodium 125, potassium 7.7, creatinine 8.36, BUN 52, bicarb 22,  glucose 193. Chest x-ray shows enlarged cardiac silhouette and pulmonary edema mildly improved compared to previous x-rays. Trop neg x1. EKG shows peaked T waves. Patient given IV calcium gluconate as well as insulin. Pending renal consult for urgent/emergent dialysis.   Assessment and Plan: [name] is a 38 y.o. year old female presenting with hyperkalemia   Hyperkalemia: Status post IV calcium and an insulin. ? Labetalol/DM exacerbators. Start beta agonist pending renal consult for likely urgent/emergent HD.?  Kayexalate. Telemetry bed. Continue to follow closely. Serial EKGs.  Acute on chronic resp failure: Suspect likely secondary to mild volume overload on presentation. Pro BNP pending. Dry weight at hospital discharge May 26, 75.4 kg. Weight pending. Noted leukocytosis on presentation. No focal infiltrate on CXR. Panculture. Start on broad-spectrum antibiotics versus healthcare associated pneumonia coverage if patient spikes fever.  ESRD: Self-reports compliance with hemodialysis. Pending renal consult.  Insulin-dependent diabetes: Sliding scale insulin. Hemoglobin A1c.  Hypertension: Hemodynamically stable. Continue home regimen.? labetalol may be exacerbator of #1.  Anemia: ARD. Hgd stable. No overt signs of bleeding. Continue to follow.   FEN/GI: Renal diet Prophylaxis: Subcutaneous heparin Disposition: Pending further evaluation Code Status: Full code   Patient Active Problem List   Diagnosis Date Noted  . Hyperkalemia 04/18/2014  . Symptomatic anemia 01/24/2014  . ESRD on hemodialysis 08/25/2013  . History of pancreas transplant 08/23/2013  . Ovarian tumor, malignant   . History of renal transplant 01/21/2012  . Anemia 01/21/2012  . DM (diabetes mellitus) 01/21/2012  . 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 HYPERLIPIDEMIA 10/10/2008  . HYPERTENSION 10/10/2008   Past Medical History: Past Medical History  Diagnosis Date  . HTN (hypertension)     not well controlled  . 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 History of renal transplant     Hx of pancreas-kidney transplant in 2010, started back on dialysis in Oct 2013. Back on insulin also due to failure of pancreatic graft.    . 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 ESRD on dialysis     "  [facility]; TTS; (01/27/2013)  . History of blood transfusion     "lots of times" (01/27/2013)   . CAP (community acquired pneumonia) 2010  . Type II diabetes mellitus   . Anemia   . Ovarian tumor, malignant 2005    resected in [place]' pt denies that this was cancer on 01/27/2013  . Diabetic retinopathy   . Hemodialysis patient   . On home oxygen therapy     "5L; 24/7" (01/25/2014)    Past Surgical History: Past Surgical History  Procedure Laterality Date  . Tumor removal  2005    Ovarian, resected in [place]  . Av fistula placement  06/03/2012    Procedure: INSERTION OF ARTERIOVENOUS (AV) GORE-TEX GRAFT ARM;  Surgeon: Rosetta Posner, MD;  Location: [place];  Service: Vascular;  Laterality: Left;  [name] Eye surgery Right     "cause of diabetes" (4/23/20140  . Combined kidney-pancreas transplant  2010    /notes 01/27/2013; @ [facility]  . 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 Renal biopsy  12/13/2010; 10/25/2011    Archie Endo 01/27/2013    Social History: History   Social History  . Marital Status: Married    Spouse Name: N/A    Number of Children: 1  . Years of Education: N/A   Occupational History  . Unemployed    Social History Main Topics  . Smoking status: Never Smoker   . Smokeless tobacco: Never Used  . Alcohol Use: No  . Drug Use: No  . Sexual Activity: No   Other Topics Concern  . None   Social History Narrative   Lives with husband and son (69yrs old)..Unemployed                   Family History: Family History  Problem Relation Age of Onset  . Diabetes Mother   . Liver cancer Father   . Cancer Father     Allergies: Allergies  Allergen Reactions  . Ace Inhibitors Cough    Current Facility-Administered Medications  Medication Dose Route Frequency Provider Last Rate Last Dose  . 0.9 %  sodium chloride infusion  250 mL Intravenous PRN Shanda Howells, MD      . acetaminophen (TYLENOL) tablet 1,000 mg  1,000 mg Oral Q6H PRN Shanda Howells, MD      . amLODipine (NORVASC) tablet 10 mg  10 mg  Oral QHS Shanda Howells, MD      . Derrill Memo ON 04/19/2014] calcium acetate (PHOSLO) capsule 2,001 mg  2,001 mg Oral TID WC Shanda Howells, MD      . calcium gluconate 1 g in sodium chloride 0.9 % 100 mL IVPB  1 g Intravenous Once Osvaldo Shipper, MD      . cloNIDine (CATAPRES) tablet 0.1 mg  0.1 mg Oral Daily Shanda Howells, MD      . diphenhydrAMINE (BENADRYL) capsule 25 mg  25 mg Oral Daily PRN Shanda Howells, MD      . escitalopram (LEXAPRO) tablet 10 mg  10 mg Oral Daily Shanda Howells, MD      . heparin injection 5,000 Units  5,000 Units Subcutaneous 3 times per day Shanda Howells, MD      . Derrill Memo ON 04/19/2014] insulin aspart (novoLOG) injection 12 Units  12 Units Subcutaneous BID WC Shanda Howells, MD      . insulin glargine (LANTUS) injection 63 Units  63 Units Subcutaneous QHS Shanda Howells, MD      .  labetalol (NORMODYNE) tablet 300 mg  300 mg Oral Daily Shanda Howells, MD      . metoCLOPramide (REGLAN) tablet 5 mg  5 mg Oral QID Shanda Howells, MD      . simvastatin (ZOCOR) tablet 20 mg  20 mg Oral Q M,W,F Shanda Howells, MD      . sodium chloride 0.9 % injection 3 mL  3 mL Intravenous Q12H Shanda Howells, MD      . sodium chloride 0.9 % injection 3 mL  3 mL Intravenous Q12H Shanda Howells, MD      . sodium chloride 0.9 % injection 3 mL  3 mL Intravenous PRN Shanda Howells, MD       Current Outpatient Prescriptions  Medication Sig Dispense Refill  . acetaminophen (TYLENOL) 500 MG tablet Take 1,000 mg by mouth every 6 (six) hours as needed for moderate pain.      [name] amLODipine (NORVASC) 10 MG tablet Take 10 mg by mouth at bedtime.       [name] aspirin EC 81 MG tablet Take 81 mg by mouth at bedtime.       . calcium acetate (PHOSLO) 667 MG capsule Take 3 capsules (2,001 mg total) by mouth 3 (three) times daily with meals.      . cloNIDine (CATAPRES) 0.1 MG tablet Take 0.1 mg by mouth daily.       . diphenhydrAMINE (BENADRYL) 25 mg capsule Take 25 mg by mouth daily as needed for allergies.      [name]  escitalopram (LEXAPRO) 20 MG tablet Take 10 mg by mouth daily.       . insulin aspart (NOVOLOG) 100 UNIT/ML injection Inject 12 Units into the skin 2 (two) times daily with breakfast and lunch.       . insulin glargine (LANTUS) 100 UNIT/ML injection Inject 0.63 mLs (63 Units total) into the skin at bedtime.  10 mL  11  . labetalol (NORMODYNE) 300 MG tablet Take 300 mg by mouth daily.       . metoCLOPramide (REGLAN) 5 MG tablet Take 5 mg by mouth 4 (four) times daily.      . simvastatin (ZOCOR) 20 MG tablet Take 20 mg by mouth every Monday, Wednesday, and Friday.       Review Of Systems: 12 point ROS negative except as noted above in HPI.  Physical Exam: Filed Vitals:   04/18/14 1900  BP:   Pulse: 95  Temp:   Resp: 24    General: alert and cooperative HEENT: PERRLA and extra ocular movement intact Heart: S1, S2 normal, no murmur, rub or gallop, regular rate and rhythm Lungs: clear to auscultation and unlabored breathing Abdomen: abdomen is soft without significant tenderness, masses, organomegaly or guarding Extremities: extremities normal, atraumatic, no cyanosis or edema Skin:no rashes, no ecchymoses Neurology: normal without focal findings  Labs and Imaging: Lab Results  Component Value Date/Time   NA 125* 04/18/2014  5:20 PM   NA 138 07/08/2012 12:49 PM   K >7.7* 04/18/2014  5:20 PM   K 4.0 07/08/2012 12:49 PM   CL 82* 04/18/2014  5:20 PM   CL 110* 07/08/2012 12:49 PM   CO2 22 04/18/2014  5:20 PM   CO2 16* 07/08/2012 12:49 PM   BUN 52* 04/18/2014  5:20 PM   BUN 49.0* 07/08/2012 12:49 PM   CREATININE 8.36* 04/18/2014  5:20 PM   CREATININE 5.2* 07/08/2012 12:49 PM   GLUCOSE 193* 04/18/2014  5:20 PM   GLUCOSE 147* 07/08/2012 12:49 PM  Lab Results  Component Value Date   WBC 11.7* 04/18/2014   HGB 7.5* 04/18/2014   HCT 22.5* 04/18/2014   MCV 93.0 04/18/2014   PLT 238 04/18/2014    Dg Chest Portable 1 View  04/18/2014   CLINICAL DATA:  Shortness of breath.  Pain in chest.  EXAM:  PORTABLE CHEST - 1 VIEW  COMPARISON:  02/28/2014  FINDINGS: Enlarged cardiopericardial silhouette noted with indistinct pulmonary vasculature. Mild interstitial accentuation is noted but this is improved compared to prior.  Mildly low lung volumes.  No definite pleural effusion.  IMPRESSION: 1. Mild to moderate enlargement of the cardiopericardial silhouette with mild interstitial edema, improved from prior.   Electronically Signed   By: Sherryl Barters M.D.   On: 04/18/2014 18:04           Shanda Howells MD  Pager: [phone]

## 2014-04-18 NOTE — Consult Note (Signed)
Renal Service Consult Note [facility]  [name] 04/18/2014 [name] Requesting Physician:  Dr Mingo Amber, [facility] ED  Reason for Consult:  ESRD patient with dyspnea, high K >7.7 HPI: The patient is a 38 y.o. year-old w ESRD, hx TB on home O2, hx ovarian Ca, HTN, DM2 w retinopathy >> presenting to ED today with SOB.  Last HD was Saturday, denies missing HD. No CP, pain in shoulders, all over.  K+ returned at >7.7 and EKG showed sinus tach w peaked T waves. CXR shows early pulm edema / vasc congestion pattern.  Chart review: Aug 2007-- severe preeclampsia, C-section delivery, DM uncontrolled, HTN, TB, CKD sec to DM July 2007- preeclampsia, chronic HTN and nephrotic range proteinuria, TB +, IDDM Jan 2010-  LLL PNA, ESRD, GPC bactermia Mar 2010-- MRSA HD cath related sepsis, ESRD, DM, HTN, depression, Raliegh Ip Jun 2013-- anemia, weakness, DOE > no GIB, hx mult transfusions >> rec'd 2u prbcs, dc'd home stable Sept 2013-- acute on CRF, AVF placed 05/2012, anemia- severe, HTN, hx TB treated 2007, DM, HL Apr 2014-- vol overload, failed kidney/panc trasnplant (Tx was 2010, had pancreatectomy Mar 2012 for abcess formation), ESRD >> treated with HD and dc'd stable Jul 2014-- pulm edmea, treated with HD and resolved, also HTN, ESRD MWF HD Nov 2014-- K 7.5, pulm edema, treated with HD x 3 in hospital Apr 2015-- R flank pain, Hb low in ED 5.9 , cxr w bilat pulm edema >> admitted, transfused, HD for vol overload, improved and dc'd home; cont home O2, hx of prior ovarian Ca May 2015-- SOB and pulm edema, vol excess >> improved w serial HD May 2015-- volume overload, SOB and pulm edema, HTN, acute resp failure >> treated with HD and dc'd home stable  Past Medical History  Past Medical History  Diagnosis Date  . HTN (hypertension)     not well controlled  . 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 History  of renal transplant     Hx of pancreas-kidney transplant in 2010, started back on dialysis in Oct 2013. Back on insulin also due to failure of pancreatic graft.    . 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 ESRD on dialysis     "[facility]; TTS; (01/27/2013)  . History of blood transfusion     "lots of times" (01/27/2013)  . CAP (community acquired pneumonia) 2010  . Type II diabetes mellitus   . Anemia   . Ovarian tumor, malignant 2005    resected in [place]' pt denies that this was cancer on 01/27/2013  . Diabetic retinopathy   . Hemodialysis patient   . On home oxygen therapy     "5L; 24/7" (01/25/2014)   Past Surgical History  Past Surgical History  Procedure Laterality Date  . Tumor removal  2005    Ovarian, resected in [place]  . Av fistula placement  06/03/2012    Procedure: INSERTION OF ARTERIOVENOUS (AV) GORE-TEX GRAFT ARM;  Surgeon: Rosetta Posner, MD;  Location: [place];  Service: Vascular;  Laterality: Left;  [name] Eye surgery Right     "cause of diabetes" (4/23/20140  . Combined kidney-pancreas transplant  2010    /notes 01/27/2013; @ [facility]  . 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 Renal biopsy  12/13/2010;  10/25/2011    Archie Endo 01/27/2013   Family History  Family History  Problem Relation Age of Onset  . Diabetes Mother   . Liver cancer Father   . Cancer Father    Social History  reports that she has never smoked. She has never used smokeless tobacco. She reports that she does not drink alcohol or use illicit drugs. Allergies  Allergies  Allergen Reactions  . Ace Inhibitors Cough   Home medications Prior to Admission medications   Medication Sig Start Date End Date Taking? Authorizing Provider  acetaminophen (TYLENOL) 500 MG tablet Take 1,000 mg by mouth every 6 (six) hours as needed for moderate pain.   Yes Historical Provider, MD  amLODipine (NORVASC) 10 MG  tablet Take 10 mg by mouth at bedtime.    Yes Historical Provider, MD  aspirin EC 81 MG tablet Take 81 mg by mouth at bedtime.    Yes Historical Provider, MD  calcium acetate (PHOSLO) 667 MG capsule Take 3 capsules (2,001 mg total) by mouth 3 (three) times daily with meals. 01/28/13  Yes Myriam Jacobson, PA-C  cloNIDine (CATAPRES) 0.1 MG tablet Take 0.1 mg by mouth daily.    Yes Historical Provider, MD  diphenhydrAMINE (BENADRYL) 25 mg capsule Take 25 mg by mouth daily as needed for allergies.   Yes Historical Provider, MD  escitalopram (LEXAPRO) 20 MG tablet Take 10 mg by mouth daily.    Yes Historical Provider, MD  insulin aspart (NOVOLOG) 100 UNIT/ML injection Inject 12 Units into the skin 2 (two) times daily with breakfast and lunch.    Yes Historical Provider, MD  insulin glargine (LANTUS) 100 UNIT/ML injection Inject 0.63 mLs (63 Units total) into the skin at bedtime. 03/01/14  Yes Allie Bossier, MD  labetalol (NORMODYNE) 300 MG tablet Take 300 mg by mouth daily.    Yes Historical Provider, MD  metoCLOPramide (REGLAN) 5 MG tablet Take 5 mg by mouth 4 (four) times daily.   Yes Historical Provider, MD  simvastatin (ZOCOR) 20 MG tablet Take 20 mg by mouth every Monday, Wednesday, and Friday.   Yes Historical Provider, MD   Liver Function Tests No results found for this basename: AST, ALT, ALKPHOS, BILITOT, PROT, ALBUMIN,  in the last 168 hours No results found for this basename: LIPASE, AMYLASE,  in the last 168 hours CBC  Recent Labs Lab 04/18/14 1720  WBC 11.7*  HGB 7.5*  HCT 22.5*  MCV 93.0  PLT 998   Basic Metabolic Panel  Recent Labs Lab 04/18/14 1720  NA 125*  K >7.7*  CL 82*  CO2 22  GLUCOSE 193*  BUN 52*  CREATININE 8.36*  CALCIUM 9.0    Filed Vitals:   04/18/14 1815 04/18/14 1839 04/18/14 1845 04/18/14 1900  BP: 167/80 149/68    Pulse: 95  95 95  Temp:      TempSrc:      Resp: 27 31 26 24   SpO2: 100% 95% 95% 96%   Exam: No distress, alert No rash,  cyanosis or gangrene Sclera anicteric, throat clear +JVD Chest occ basilar rales bilat RRR no MRG Abd soft, NTND, no ascites or HSM 1+ bilat LE edema Neuro is nf, Ox 3  HD: TTS [employer] 4h   72kg   2/2.25 Bath  400/A1.5  LUA AVF   Heparin 2400 Hect 1 ug TIW,  Aranesp 200 mcg q Tues    Assessment: 1 Dyspnea / volume excess / pulm edema 2 Hyperkalemia- w peaked T waves  on EKG 3 ESRD on HD 4 Hx pulm TB / home O2 5 Hx failed KP transplant 6 DM2  7 HTN 8 Anemia cont aranesp 9 HPTH on phoslo, vit D   Plan- Acute HD tonight with low K bath and max UF as tolerated. Repeat K in am, if stable may be d/c'd in am.     Kelly Splinter MD (pgr) [phone]    ([phone] 04/18/2014, 7:13 PM

## 2014-04-18 NOTE — ED Provider Notes (Signed)
CSN: [id number]     Arrival date & time 04/18/14  1710 History   First MD Initiated Contact with Patient 04/18/14 1710     Chief Complaint  Patient presents with  . Shortness of Breath     (Consider location/radiation/quality/duration/timing/severity/associated sxs/prior Treatment) Patient is a 38 y.o. female presenting with shortness of breath. The history is provided by the patient.  Shortness of Breath Severity:  Moderate Onset quality:  Gradual Timing:  Constant Progression:  Worsening Chronicity:  Recurrent Context: not activity, not animal exposure and not URI   Relieved by:  Nothing Worsened by:  Nothing tried Associated symptoms: no abdominal pain, no chest pain, no ear pain, no fever, no rash and no vomiting     Past Medical History  Diagnosis Date  . HTN (hypertension)     not well controlled  . 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 History of renal transplant     Hx of pancreas-kidney transplant in 2010, started back on dialysis in Oct 2013. Back on insulin also due to failure of pancreatic graft.    . 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 ESRD on dialysis     "[facility]; TTS; (01/27/2013)  . History of blood transfusion     "lots of times" (01/27/2013)  . CAP (community acquired pneumonia) 2010  . Type II diabetes mellitus   . Anemia   . Ovarian tumor, malignant 2005    resected in [place]' pt denies that this was cancer on 01/27/2013  . Diabetic retinopathy   . Hemodialysis patient   . On home oxygen therapy     "5L; 24/7" (01/25/2014)   Past Surgical History  Procedure Laterality Date  . Tumor removal  2005    Ovarian, resected in [place]  . Av fistula placement  06/03/2012    Procedure: INSERTION OF ARTERIOVENOUS (AV) GORE-TEX GRAFT ARM;  Surgeon: Rosetta Posner, MD;  Location: [facility];  Service: Vascular;  Laterality: Left;  [name]  Eye surgery Right     "cause of diabetes" (4/23/20140  . Combined kidney-pancreas transplant  2010    /notes 01/27/2013; @ [facility]  . 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 Renal biopsy  12/13/2010; 10/25/2011    Archie Endo 01/27/2013   Family History  Problem Relation Age of Onset  . Diabetes Mother   . Liver cancer Father   . Cancer Father    History  Substance Use Topics  . Smoking status: Never Smoker   . Smokeless tobacco: Never Used  . Alcohol Use: No   OB History   Grav Para Term Preterm Abortions TAB SAB Ect Mult Living                 Review of Systems  Constitutional: Positive for chills. Negative for fever.  HENT: Negative for ear pain.   Respiratory: Positive for shortness of breath.   Cardiovascular: Negative for chest pain and leg swelling.  Gastrointestinal: Negative for vomiting and abdominal pain.  Skin: Negative for rash.  All other systems reviewed and are negative.     Allergies  Ace inhibitors  Home Medications   Prior to Admission medications   Medication Sig Start Date End Date Taking? Authorizing Provider  acetaminophen (TYLENOL) 500 MG tablet Take 1,000 mg by mouth every 6 (  six) hours as needed for moderate pain.    Historical Provider, MD  amLODipine (NORVASC) 10 MG tablet Take 10 mg by mouth at bedtime.     Historical Provider, MD  aspirin EC 81 MG tablet Take 81 mg by mouth at bedtime.     Historical Provider, MD  calcium acetate (PHOSLO) 667 MG capsule Take 3 capsules (2,001 mg total) by mouth 3 (three) times daily with meals. 01/28/13   Myriam Jacobson, PA-C  cloNIDine (CATAPRES) 0.1 MG tablet Take 0.1 mg by mouth daily.     Historical Provider, MD  diphenhydrAMINE (BENADRYL) 25 mg capsule Take 25 mg by mouth daily as needed for allergies.    Historical Provider, MD  escitalopram (LEXAPRO) 20 MG tablet Take 10 mg by mouth daily.     Historical Provider, MD  insulin aspart  (NOVOLOG) 100 UNIT/ML injection Inject 12 Units into the skin 2 (two) times daily with breakfast and lunch.     Historical Provider, MD  insulin glargine (LANTUS) 100 UNIT/ML injection Inject 0.63 mLs (63 Units total) into the skin at bedtime. 03/01/14   Allie Bossier, MD  labetalol (NORMODYNE) 300 MG tablet Take 300 mg by mouth daily.     Historical Provider, MD  metoCLOPramide (REGLAN) 5 MG tablet Take 5 mg by mouth 4 (four) times daily.    Historical Provider, MD  simvastatin (ZOCOR) 20 MG tablet Take 20 mg by mouth every Monday, Wednesday, and Friday.    Historical Provider, MD   BP 149/68  Pulse 95  Temp(Src) 99.3 F (37.4 C) (Oral)  Resp 26  SpO2 95% Physical Exam  Nursing note and vitals reviewed. Constitutional: She is oriented to person, place, and time. She appears well-developed and well-nourished. No distress.  HENT:  Head: Normocephalic and atraumatic.  Mouth/Throat: Oropharynx is clear and moist.  Eyes: EOM are normal. Pupils are equal, round, and reactive to light.  Neck: Normal range of motion. Neck supple. No JVD present.  Cardiovascular: Normal rate and regular rhythm.  Exam reveals no friction rub.   No murmur heard. Pulmonary/Chest: Effort normal and breath sounds normal. No respiratory distress. She has no wheezes. She has no rales.  Abdominal: Soft. She exhibits no distension. There is no tenderness. There is no rebound.  Musculoskeletal: Normal range of motion. She exhibits no edema.  Neurological: She is alert and oriented to person, place, and time. She exhibits normal muscle tone.  Skin: No rash noted. She is not diaphoretic.    ED Course  Procedures (including critical care time) Labs Review Labs Reviewed  CBC  BASIC METABOLIC PANEL  Randolm Idol, ED    Imaging Review Dg Chest Portable 1 View  04/18/2014   CLINICAL DATA:  Shortness of breath.  Pain in chest.  EXAM: PORTABLE CHEST - 1 VIEW  COMPARISON:  02/28/2014  FINDINGS: Enlarged  cardiopericardial silhouette noted with indistinct pulmonary vasculature. Mild interstitial accentuation is noted but this is improved compared to prior.  Mildly low lung volumes.  No definite pleural effusion.  IMPRESSION: 1. Mild to moderate enlargement of the cardiopericardial silhouette with mild interstitial edema, improved from prior.   Electronically Signed   By: Sherryl Barters M.D.   On: 04/18/2014 18:04     EKG Interpretation   Date/Time:  Monday April 18 2014 17:20:06 EDT Ventricular Rate:  97 PR Interval:  201 QRS Duration: 125 QT Interval:  411 QTC Calculation: 522 R Axis:   0 Text Interpretation:  Sinus rhythm Borderline prolonged PR  interval IVCD,  consider atypical RBBB LVH with secondary repolarization abnormality ST  elevation suggests acute pericarditis Prolonged QT interval Similar to Nov  2014, lateral T wave depression old, anterior ST elevation old Confirmed  by [facility]  MD, [place] [phone]) on 04/18/2014 5:25:23 PM     CRITICAL CARE Performed by: Osvaldo Shipper   Total critical care time: 30 minutes  Critical care time was exclusive of separately billable procedures and treating other patients.  Critical care was necessary to treat or prevent imminent or life-threatening deterioration.  Critical care was time spent personally by me on the following activities: development of treatment plan with patient and/or surrogate as well as nursing, discussions with consultants, evaluation of patient's response to treatment, examination of patient, obtaining history from patient or surrogate, ordering and performing treatments and interventions, ordering and review of laboratory studies, ordering and review of radiographic studies, pulse oximetry and re-evaluation of patient's condition.  MDM   Final diagnoses:  Hyperkalemia    66F with hx of ESRD, pancreas transplant, diabetes presents with SOB. Present every Monday before she gets dialysis (does dialysis  Tues/Thurs/Sat), but worse today. On 5-6L at home, turned hers up to 8L. Complaining of chills, body aches, general malaise. Has not missed any dialysis and had full treatment 2 days ago. Here tachycardic, EKG from EMS shows peaked T waves - will preemptively treat hyperkalemia with calcium, insulin, glucose. Will check labs. Nephrologist is Dr. Justin Mend. Repeat EKG unchanged, another dose of Calcium given. I spoke with Nephrology who agreed patient needs dialysis tonight. Admitted by Dr. Ernestina Patches.   Osvaldo Shipper, MD 04/18/14 [phone]

## 2014-04-19 ENCOUNTER — Encounter (HOSPITAL_COMMUNITY): Payer: Self-pay | Admitting: Nephrology

## 2014-04-19 DIAGNOSIS — E871 Hypo-osmolality and hyponatremia: Secondary | ICD-10-CM | POA: Diagnosis present

## 2014-04-19 DIAGNOSIS — Z992 Dependence on renal dialysis: Secondary | ICD-10-CM

## 2014-04-19 DIAGNOSIS — N186 End stage renal disease: Secondary | ICD-10-CM

## 2014-04-19 DIAGNOSIS — I10 Essential (primary) hypertension: Secondary | ICD-10-CM

## 2014-04-19 DIAGNOSIS — Z9483 Pancreas transplant status: Secondary | ICD-10-CM

## 2014-04-19 DIAGNOSIS — Z940 Kidney transplant status: Secondary | ICD-10-CM

## 2014-04-19 HISTORY — DX: Kidney transplant status: Z94.0

## 2014-04-19 LAB — BASIC METABOLIC PANEL
ANION GAP: 19 — AB (ref 5–15)
BUN: 30 mg/dL — ABNORMAL HIGH (ref 6–23)
CHLORIDE: 86 meq/L — AB (ref 96–112)
CO2: 24 meq/L (ref 19–32)
Calcium: 8.9 mg/dL (ref 8.4–10.5)
Creatinine, Ser: 5.81 mg/dL — ABNORMAL HIGH (ref 0.50–1.10)
GFR calc Af Amer: 10 mL/min — ABNORMAL LOW (ref >90)
GFR calc non Af Amer: 8 mL/min — ABNORMAL LOW (ref >90)
Glucose, Bld: 176 mg/dL — ABNORMAL HIGH (ref 70–99)
Potassium: 5.2 mEq/L (ref 3.7–5.3)
SODIUM: 129 meq/L — AB (ref 137–147)

## 2014-04-19 LAB — GLUCOSE, CAPILLARY
GLUCOSE-CAPILLARY: 119 mg/dL — AB (ref 70–99)
GLUCOSE-CAPILLARY: 68 mg/dL — AB (ref 70–99)
GLUCOSE-CAPILLARY: 145 mg/dL — AB (ref 70–99)
GLUCOSE-CAPILLARY: 107 mg/dL — AB (ref 70–99)
Glucose-Capillary: 111 mg/dL — ABNORMAL HIGH (ref 70–99)
Glucose-Capillary: 151 mg/dL — ABNORMAL HIGH (ref 70–99)
Glucose-Capillary: 91 mg/dL (ref 70–99)

## 2014-04-19 LAB — TROPONIN I: Troponin I: <0.3 ng/mL (ref <0.30)

## 2014-04-19 LAB — MRSA PCR SCREENING: MRSA BY PCR: POSITIVE — AB

## 2014-04-19 LAB — HEMOGLOBIN A1C
HEMOGLOBIN A1C: 6.1 % — AB (ref <5.7)
Mean Plasma Glucose: 128 mg/dL — ABNORMAL HIGH (ref <117)

## 2014-04-19 MED ORDER — HEPARIN SODIUM (PORCINE) 1000 UNIT/ML DIALYSIS: 1000.0000 [IU] | INTRAMUSCULAR | Status: DC | PRN

## 2014-04-19 MED ORDER — HYDROCODONE-ACETAMINOPHEN 5-325 MG PO TABS
ORAL_TABLET | ORAL | Status: AC
  Filled 2014-04-19: qty 2

## 2014-04-19 MED ORDER — DOXERCALCIFEROL 4 MCG/2ML IV SOLN
INTRAVENOUS | Status: AC
  Administered 2014-04-19: 1 ug via INTRAVENOUS
  Filled 2014-04-19: qty 2

## 2014-04-19 MED ORDER — DOXERCALCIFEROL 4 MCG/2ML IV SOLN
1.0000 ug | INTRAVENOUS | Status: DC
  Administered 2014-04-19 – 2014-04-23 (×2): 1 ug via INTRAVENOUS

## 2014-04-19 MED ORDER — HYDROCODONE-ACETAMINOPHEN 5-325 MG PO TABS
1.0000 | ORAL_TABLET | Freq: Four times a day (QID) | ORAL | Status: DC | PRN
Start: 2014-04-19 — End: 2014-04-21
  Administered 2014-04-19 (×3): 1 via ORAL
  Administered 2014-04-20 – 2014-04-21 (×4): 2 via ORAL
  Filled 2014-04-19 (×3): qty 2
  Filled 2014-04-19: qty 1
  Filled 2014-04-19: qty 2

## 2014-04-19 MED ORDER — HYDROCODONE-ACETAMINOPHEN 5-325 MG PO TABS
1.0000 | ORAL_TABLET | Freq: Once | ORAL | Status: AC
  Administered 2014-04-19: 1 via ORAL
  Filled 2014-04-19: qty 1

## 2014-04-19 MED ORDER — SODIUM CHLORIDE 0.9 % IV SOLN: 100.0000 mL | INTRAVENOUS | Status: DC | PRN

## 2014-04-19 MED ORDER — LIDOCAINE-PRILOCAINE 2.5-2.5 % EX CREA
1.0000 "application " | TOPICAL_CREAM | CUTANEOUS | Status: DC | PRN
  Filled 2014-04-19: qty 5

## 2014-04-19 MED ORDER — ALBUTEROL SULFATE (2.5 MG/3ML) 0.083% IN NEBU
2.5000 mg | INHALATION_SOLUTION | Freq: Four times a day (QID) | RESPIRATORY_TRACT | Status: DC
  Administered 2014-04-19 – 2014-04-20 (×5): 2.5 mg via RESPIRATORY_TRACT
  Filled 2014-04-19 (×5): qty 3

## 2014-04-19 MED ORDER — ACETAMINOPHEN 325 MG PO TABS
ORAL_TABLET | ORAL | Status: AC
  Filled 2014-04-19: qty 2

## 2014-04-19 MED ORDER — INSULIN ASPART 100 UNIT/ML ~~LOC~~ SOLN
0.0000 [IU] | Freq: Three times a day (TID) | SUBCUTANEOUS | Status: DC
Start: 2014-04-19 — End: 2014-04-23
  Administered 2014-04-20 – 2014-04-21 (×4): 2 [IU] via SUBCUTANEOUS
  Administered 2014-04-22: 1 [IU] via SUBCUTANEOUS

## 2014-04-19 MED ORDER — MUPIROCIN 2 % EX OINT
1.0000 "application " | TOPICAL_OINTMENT | Freq: Two times a day (BID) | CUTANEOUS | Status: DC
  Administered 2014-04-19 – 2014-04-23 (×9): 1 via NASAL
  Filled 2014-04-19 (×3): qty 22

## 2014-04-19 MED ORDER — ALBUTEROL SULFATE (2.5 MG/3ML) 0.083% IN NEBU
2.5000 mg | INHALATION_SOLUTION | RESPIRATORY_TRACT | Status: DC | PRN
  Filled 2014-04-19: qty 3

## 2014-04-19 MED ORDER — LIDOCAINE HCL (PF) 1 % IJ SOLN: 5.0000 mL | INTRAMUSCULAR | Status: DC | PRN

## 2014-04-19 MED ORDER — INSULIN ASPART 100 UNIT/ML ~~LOC~~ SOLN: 0.0000 [IU] | Freq: Every day | SUBCUTANEOUS | Status: DC

## 2014-04-19 MED ORDER — INSULIN GLARGINE 100 UNIT/ML ~~LOC~~ SOLN
50.0000 [IU] | Freq: Every day | SUBCUTANEOUS | Status: DC
  Administered 2014-04-19 – 2014-04-22 (×4): 50 [IU] via SUBCUTANEOUS
  Filled 2014-04-19 (×5): qty 0.5

## 2014-04-19 MED ORDER — ACETAMINOPHEN 325 MG PO TABS
650.0000 mg | ORAL_TABLET | Freq: Once | ORAL | Status: AC
Start: 2014-04-19 — End: 2014-04-19
  Administered 2014-04-19: 650 mg via ORAL

## 2014-04-19 MED ORDER — PIPERACILLIN-TAZOBACTAM IN DEX 2-0.25 GM/50ML IV SOLN
2.2500 g | Freq: Three times a day (TID) | INTRAVENOUS | Status: DC
  Administered 2014-04-19 – 2014-04-20 (×4): 2.25 g via INTRAVENOUS
  Filled 2014-04-19 (×6): qty 50

## 2014-04-19 MED ORDER — HEPARIN SODIUM (PORCINE) 1000 UNIT/ML DIALYSIS: 1600.0000 [IU] | Freq: Once | INTRAMUSCULAR | Status: DC

## 2014-04-19 MED ORDER — VANCOMYCIN HCL IN DEXTROSE 750-5 MG/150ML-% IV SOLN
750.0000 mg | INTRAVENOUS | Status: DC
  Filled 2014-04-19 (×2): qty 150

## 2014-04-19 MED ORDER — ALTEPLASE 2 MG IJ SOLR
2.0000 mg | Freq: Once | INTRAMUSCULAR | Status: DC | PRN
  Filled 2014-04-19: qty 2

## 2014-04-19 MED ORDER — CHLORHEXIDINE GLUCONATE CLOTH 2 % EX PADS
6.0000 | MEDICATED_PAD | Freq: Every day | CUTANEOUS | Status: AC
  Administered 2014-04-19 – 2014-04-23 (×5): 6 via TOPICAL

## 2014-04-19 MED ORDER — PENTAFLUOROPROP-TETRAFLUOROETH EX AERO: 1.0000 "application " | INHALATION_SPRAY | CUTANEOUS | Status: DC | PRN

## 2014-04-19 MED ORDER — VANCOMYCIN HCL 10 G IV SOLR
1500.0000 mg | Freq: Once | INTRAVENOUS | Status: AC
  Administered 2014-04-19: 1500 mg via INTRAVENOUS
  Filled 2014-04-19: qty 1500

## 2014-04-19 MED ORDER — DARBEPOETIN ALFA-POLYSORBATE 200 MCG/0.4ML IJ SOLN
200.0000 ug | INTRAMUSCULAR | Status: DC
  Administered 2014-04-19: 200 ug via INTRAVENOUS

## 2014-04-19 MED ORDER — DARBEPOETIN ALFA-POLYSORBATE 200 MCG/0.4ML IJ SOLN
INTRAMUSCULAR | Status: AC
  Administered 2014-04-19: 200 ug via INTRAVENOUS
  Filled 2014-04-19: qty 0.4

## 2014-04-19 MED ORDER — NEPRO/CARBSTEADY PO LIQD
237.0000 mL | ORAL | Status: DC | PRN
  Filled 2014-04-19: qty 237

## 2014-04-19 NOTE — Plan of Care (Signed)
Problem: Phase I Progression Outcomes Goal: Voiding-avoid urinary catheter unless indicated Outcome: Completed/Met Date Met:  04/19/14 Pt is anuric

## 2014-04-19 NOTE — Progress Notes (Signed)
ANTIBIOTIC CONSULT NOTE - INITIAL  Pharmacy Consult for Vancomycin and Zosyn Indication: rule out sepsis  Allergies  Allergen Reactions  . Ace Inhibitors Cough    Patient Measurements: Height: 5\' 3"  (160 cm) Weight: 165 lb 2 oz (74.9 kg) IBW/kg (Calculated) : 52.4  Vital Signs: Temp: 101.5 F (38.6 C) (07/14 0007) Temp src: Axillary (07/14 0007) BP: 187/70 mmHg (07/14 0007) Pulse Rate: 123 (07/13 2237) Intake/Output from previous day: 07/13 0701 - 07/14 0700 In: 118 [P.O.:118] Out: 2461  Intake/Output from this shift: Total I/O In: 118 [P.O.:118] Out: 2461 [Other:2461]  Labs:  Recent Labs  04/18/14 1720 04/18/14 1940  WBC 11.7* 11.1*  HGB 7.5* 7.2*  PLT 238 239  CREATININE 8.36* 8.41*   Estimated Creatinine Clearance: 8.9 ml/min (by C-G formula based on Cr of 8.41). No results found for this basename: VANCOTROUGH, Corlis Leak, VANCORANDOM, [place], GENTPEAK, GENTRANDOM, TOBRATROUGH, TOBRAPEAK, TOBRARND, AMIKACINPEAK, AMIKACINTROU, AMIKACIN,  in the last 72 hours   Microbiology: Recent Results (from the past 720 hour(s))  MRSA PCR SCREENING     Status: Abnormal   Collection Time    04/18/14 10:30 PM      Result Value Ref Range Status   MRSA by PCR POSITIVE (*) NEGATIVE Final   Comment:            The GeneXpert MRSA Assay (FDA     approved for NASAL specimens     only), is one component of a     comprehensive MRSA colonization     surveillance program. It is not     intended to diagnose MRSA     infection nor to guide or     monitor treatment for     MRSA infections.     RESULT CALLED TO, READ BACK BY AND VERIFIED WITH:     PETTIFORD,A RN 0109 04/19/14 MITCHELL,L    Medical History: Past Medical History  Diagnosis Date  . HTN (hypertension)     not well controlled  . 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 History of renal transplant     Hx of  pancreas-kidney transplant in 2010, started back on dialysis in Oct 2013. Back on insulin also due to failure of pancreatic graft.    . 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 ESRD on dialysis     "[facility]; TTS; (01/27/2013)  . History of blood transfusion     "lots of times" (01/27/2013)  . CAP (community acquired pneumonia) 2010  . Type II diabetes mellitus   . Anemia   . Ovarian tumor, malignant 2005    resected in [place]' pt denies that this was cancer on 01/27/2013  . Diabetic retinopathy   . Hemodialysis patient   . On home oxygen therapy     "5L; 24/7" (01/25/2014)    Medications:  Prescriptions prior to admission  Medication Sig Dispense Refill  . acetaminophen (TYLENOL) 500 MG tablet Take 1,000 mg by mouth every 6 (six) hours as needed for moderate pain.      [name] amLODipine (NORVASC) 10 MG tablet Take 10 mg by mouth at bedtime.       [name] aspirin EC 81 MG tablet Take 81 mg by mouth at bedtime.       . calcium acetate (PHOSLO) 667 MG capsule Take 3 capsules (2,001 mg total) by mouth 3 (three) times daily  with meals.      . cloNIDine (CATAPRES) 0.1 MG tablet Take 0.1 mg by mouth daily.       . diphenhydrAMINE (BENADRYL) 25 mg capsule Take 25 mg by mouth daily as needed for allergies.      [name] escitalopram (LEXAPRO) 20 MG tablet Take 10 mg by mouth daily.       . insulin aspart (NOVOLOG) 100 UNIT/ML injection Inject 12 Units into the skin 2 (two) times daily with breakfast and lunch.       . insulin glargine (LANTUS) 100 UNIT/ML injection Inject 0.63 mLs (63 Units total) into the skin at bedtime.  10 mL  11  . labetalol (NORMODYNE) 300 MG tablet Take 300 mg by mouth daily.       . metoCLOPramide (REGLAN) 5 MG tablet Take 5 mg by mouth 4 (four) times daily.      . simvastatin (ZOCOR) 20 MG tablet Take 20 mg by mouth every Monday, Wednesday, and Friday.       Assessment: 38 yo female with SOB, possible sepsis for empiric antibiotics.    Goal  of Therapy:  Vancomycin pre-HD level 15-20  Plan:  Vancomycin 1500 mg IV now, then 750 mg after each HD Zosyn 2.25 g IV q8h  Tasman Zapata, Bronson Curb 04/19/2014,1:37 AM

## 2014-04-19 NOTE — Progress Notes (Signed)
  [name] Progress Note   Subjective: Had temp to 101 deg last night, still feeling hot and cold today.  BC sent, on emp abx.  No cough or cp, no abd pain or diarrhea.   Filed Vitals:   04/19/14 0735 04/19/14 0827 04/19/14 0918 04/19/14 0928  BP:  154/80 182/99   Pulse:  101    Temp:  99.8 F (37.7 C)    TempSrc:  Oral    Resp:  22    Height:      Weight:      SpO2: 98% 100%  98%   Exam: More comfortable today, no distress, sitting up in bed No jvd  Chest scant basilar rales bilat RRR no MRG  Abd soft, NTND  No pitting edema Neuro is nf, Ox 3 LUA AVF patent w no signs of erythema, abcess or drainage  CXR 7/13 bilat mild IS pulm edema   HD: TTS [facility]  4h 72kg 2/2.25 Bath 400/A1.5 LUA AVF Heparin 2400  Hect 1 ug TIW, Aranesp 200 mcg q Tues   Assessment:  1 Vol excess / pulm edema- improved clinically w HD last night 2 Fevers- access does not look infected, blood cx's pending, does not make urine; on emp abx 3 Hyperkalemia- improved 4 ESRD on HD 5 DM2 on insulin 6 HTN on 3 BP meds 7 Anemia on aranesp  8 HPTH on phoslo, vit D 9 Home O2, hx pulm TB  Plan- short HD today to keep on schedule and to get to dry weight, await results of cx's    Kelly Splinter MD  pager [phone]    cell [phone]  04/19/2014, 10:43 AM     Recent Labs Lab 04/18/14 1720 04/18/14 1940 04/19/14 0336  NA 125*  --  129*  K >7.7*  --  5.2  CL 82*  --  86*  CO2 22  --  24  GLUCOSE 193*  --  176*  BUN 52*  --  30*  CREATININE 8.36* 8.41* 5.81*  CALCIUM 9.0  --  8.9   No results found for this basename: AST, ALT, ALKPHOS, BILITOT, PROT, ALBUMIN,  in the last 168 hours  Recent Labs Lab 04/18/14 1720 04/18/14 1940  WBC 11.7* 11.1*  HGB 7.5* 7.2*  HCT 22.5* 21.1*  MCV 93.0 91.3  PLT 238 239   . albuterol  2.5 mg Nebulization Q6H  . amLODipine  10 mg Oral Daily  . calcium acetate  2,001 mg Oral TID WC  . Chlorhexidine Gluconate Cloth  6 each Topical  Q0600  . cloNIDine  0.1 mg Oral Daily  . escitalopram  10 mg Oral Daily  . heparin  2,400 Units Intravenous Once  . heparin  5,000 Units Subcutaneous 3 times per day  . insulin aspart  12 Units Subcutaneous BID WC  . insulin glargine  63 Units Subcutaneous QHS  . labetalol  300 mg Oral Daily  . metoCLOPramide  5 mg Oral QID  . mupirocin ointment  1 application Nasal BID  . piperacillin-tazobactam (ZOSYN)  IV  2.25 g Intravenous Q8H  . simvastatin  20 mg Oral Q M,W,F  . sodium chloride  3 mL Intravenous Q12H  . sodium chloride  3 mL Intravenous Q12H  . [START ON 04/21/2014] vancomycin  750 mg Intravenous Q T,Th,Sa-HD     sodium chloride, acetaminophen, albuterol, diphenhydrAMINE, HYDROcodone-acetaminophen, sodium chloride

## 2014-04-19 NOTE — Progress Notes (Signed)
CRITICAL VALUE ALERT  Critical value received:  MRSA swab positive  Date of notification:  04/19/2014   Time of notification:  0110  Critical value read back: yes  Nurse who received alert:  Reatha Armour RN  MD notified (1st page):   Time of first page:    MD notified (2nd page):  Time of second page:  Responding MD:    Time MD responded:     Comment: Orders placed per protocol

## 2014-04-19 NOTE — Progress Notes (Signed)
Dr. Eliseo Squires called me back and stated that patient is already on antibiotic.  No new order given.

## 2014-04-19 NOTE — Progress Notes (Addendum)
PROGRESS NOTE  [name] NWG:[id number] DOB: [birth year] DOA: 04/18/2014 PCP: Tommy Medal, MD  Assessment/Plan: Hyperkalemia: resolved with dialysis  Hyponatremia- 125 upon initial pesentation   Acute on chronic resp failure: Suspect likely secondary to mild volume overload on presentation + HCAPNA Dry weight at hospital discharge May 26, 75.4 kg. Weight after dialysis 76.  leukocytosis on presentation with fever- start [place] abx- vanc/zosyn  ESRD: Self-reports compliance with hemodialysis. S/p dialysis last PM that patient cut short  Insulin-dependent diabetes: Sliding scale insulin. Hemoglobin A1c.   Hypertension: resume home meds  Anemia: ARD. Hgd stable. No overt signs of bleeding. Continue to follow.   Gram + cocci in 2/2 blood cultures- on IV Abx- monitor   Code Status: full Family Communication: patient Disposition Plan: leave in SDU for now   Consultants:  renal  Procedures:  HD   HPI/Subjective: C/o not feeling well + pain "all over"  Objective: Filed Vitals:   04/19/14 0437  BP: 154/77  Pulse:   Temp: 99 F (37.2 C)  Resp:     Intake/Output Summary (Last 24 hours) at 04/19/14 0819 Last data filed at 04/19/14 0450  Gross per 24 hour  Intake    908 ml  Output   2461 ml  Net  -1553 ml   Filed Weights   04/18/14 2020 04/18/14 2237 04/19/14 0437  Weight: 77.3 kg (170 lb 6.7 oz) 74.9 kg (165 lb 2 oz) 76 kg (167 lb 8.8 oz)    Exam:   General:  A+Ox3, NAD  Cardiovascular: rrr- occasionally tachy on tele  Respiratory: coarse breath sounds, no wheezing  Abdomen: +BS, soft  Musculoskeletal: moves all 4 ext  Data Reviewed: Basic Metabolic Panel:  Recent Labs Lab 04/18/14 1720 04/18/14 1940 04/19/14 0336  NA 125*  --  129*  K >7.7*  --  5.2  CL 82*  --  86*  CO2 22  --  24  GLUCOSE 193*  --  176*  BUN 52*  --  30*  CREATININE 8.36* 8.41* 5.81*  CALCIUM 9.0  --  8.9   Liver Function Tests: No results found for this basename:  AST, ALT, ALKPHOS, BILITOT, PROT, ALBUMIN,  in the last 168 hours No results found for this basename: LIPASE, AMYLASE,  in the last 168 hours No results found for this basename: AMMONIA,  in the last 168 hours CBC:  Recent Labs Lab 04/18/14 1720 04/18/14 1940  WBC 11.7* 11.1*  HGB 7.5* 7.2*  HCT 22.5* 21.1*  MCV 93.0 91.3  PLT 238 239   Cardiac Enzymes:  Recent Labs Lab 04/18/14 1920 04/19/14 0114  TROPONINI <0.30 <0.30   BNP (last 3 results)  Recent Labs  04/18/14 1938  PROBNP >70000.0*   CBG:  Recent Labs Lab 04/19/14 0427  GLUCAP 119*    Recent Results (from the past 240 hour(s))  MRSA PCR SCREENING     Status: Abnormal   Collection Time    04/18/14 10:30 PM      Result Value Ref Range Status   MRSA by PCR POSITIVE (*) NEGATIVE Final   Comment:            The GeneXpert MRSA Assay (FDA     approved for NASAL specimens     only), is one component of a     comprehensive MRSA colonization     surveillance program. It is not     intended to diagnose MRSA     infection nor to guide or  monitor treatment for     MRSA infections.     RESULT CALLED TO, READ BACK BY AND VERIFIED WITH:     PETTIFORD,A RN 0109 04/19/14 MITCHELL,L     Studies: Dg Chest Portable 1 View  04/18/2014   CLINICAL DATA:  Shortness of breath.  Pain in chest.  EXAM: PORTABLE CHEST - 1 VIEW  COMPARISON:  02/28/2014  FINDINGS: Enlarged cardiopericardial silhouette noted with indistinct pulmonary vasculature. Mild interstitial accentuation is noted but this is improved compared to prior.  Mildly low lung volumes.  No definite pleural effusion.  IMPRESSION: 1. Mild to moderate enlargement of the cardiopericardial silhouette with mild interstitial edema, improved from prior.   Electronically Signed   By: Sherryl Barters M.D.   On: 04/18/2014 18:04    Scheduled Meds: . albuterol  2.5 mg Nebulization Q6H  . amLODipine  10 mg Oral Daily  . calcium acetate  2,001 mg Oral TID WC  .  Chlorhexidine Gluconate Cloth  6 each Topical Q0600  . cloNIDine  0.1 mg Oral Daily  . escitalopram  10 mg Oral Daily  . heparin  2,400 Units Intravenous Once  . heparin  5,000 Units Subcutaneous 3 times per day  . insulin aspart  12 Units Subcutaneous BID WC  . insulin glargine  63 Units Subcutaneous QHS  . labetalol  300 mg Oral Daily  . metoCLOPramide  5 mg Oral QID  . mupirocin ointment  1 application Nasal BID  . piperacillin-tazobactam (ZOSYN)  IV  2.25 g Intravenous Q8H  . simvastatin  20 mg Oral Q M,W,F  . sodium chloride  3 mL Intravenous Q12H  . sodium chloride  3 mL Intravenous Q12H  . [START ON 04/21/2014] vancomycin  750 mg Intravenous Q T,Th,Sa-HD   Continuous Infusions:  Antibiotics Given (last 72 hours)   Date/Time Action Medication Dose Rate   04/19/14 0413 Given   piperacillin-tazobactam (ZOSYN) IVPB 2.25 g 2.25 g 100 mL/hr   04/19/14 0450 Given   vancomycin (VANCOCIN) 1,500 mg in sodium chloride 0.9 % 500 mL IVPB 1,500 mg 250 mL/hr      Active Problems:   Hyperkalemia    Time spent: 35 min    Gabi Mcfate  [facility] Pager [phone]. If 7PM-7AM, please contact night-coverage at [email or web address], password [facility] 04/19/2014, 8:19 AM  LOS: 1 day

## 2014-04-19 NOTE — Progress Notes (Signed)
Utilization Review Completed.  

## 2014-04-19 NOTE — Progress Notes (Signed)
CRITICAL VALUE ALERT  Critical value received:  Blood culture  Date of notification: 04/19/2014   Time of notification:  5625 Critical value read back:yes  Nurse who received alert:  Donnella Bi, RN  MD notified (1st page):  Dr. Eliseo Squires  Time of first page:  3:21PM  MD notified (2nd page):  Time of second page:  Responding MD:Dr. Eliseo Squires  Time MD responded:  3:21p

## 2014-04-20 DIAGNOSIS — R509 Fever, unspecified: Secondary | ICD-10-CM | POA: Diagnosis present

## 2014-04-20 DIAGNOSIS — A4101 Sepsis due to Methicillin susceptible Staphylococcus aureus: Secondary | ICD-10-CM

## 2014-04-20 DIAGNOSIS — R7881 Bacteremia: Secondary | ICD-10-CM | POA: Diagnosis present

## 2014-04-20 DIAGNOSIS — E871 Hypo-osmolality and hyponatremia: Secondary | ICD-10-CM

## 2014-04-20 DIAGNOSIS — I369 Nonrheumatic tricuspid valve disorder, unspecified: Secondary | ICD-10-CM

## 2014-04-20 LAB — BASIC METABOLIC PANEL
ANION GAP: 15 (ref 5–15)
BUN: 20 mg/dL (ref 6–23)
CHLORIDE: 91 meq/L — AB (ref 96–112)
CO2: 27 mEq/L (ref 19–32)
CREATININE: 3.98 mg/dL — AB (ref 0.50–1.10)
Calcium: 8.7 mg/dL (ref 8.4–10.5)
GFR calc non Af Amer: 13 mL/min — ABNORMAL LOW (ref >90)
GFR, EST AFRICAN AMERICAN: 16 mL/min — AB (ref >90)
Glucose, Bld: 144 mg/dL — ABNORMAL HIGH (ref 70–99)
Potassium: 4.8 mEq/L (ref 3.7–5.3)
SODIUM: 133 meq/L — AB (ref 137–147)

## 2014-04-20 LAB — GLUCOSE, CAPILLARY
GLUCOSE-CAPILLARY: 153 mg/dL — AB (ref 70–99)
GLUCOSE-CAPILLARY: 180 mg/dL — AB (ref 70–99)
GLUCOSE-CAPILLARY: 182 mg/dL — AB (ref 70–99)
Glucose-Capillary: 250 mg/dL — ABNORMAL HIGH (ref 70–99)
Glucose-Capillary: 70 mg/dL (ref 70–99)
Glucose-Capillary: 97 mg/dL (ref 70–99)
Glucose-Capillary: 166 mg/dL — ABNORMAL HIGH (ref 70–99)

## 2014-04-20 LAB — CBC
HCT: 20.9 % — ABNORMAL LOW (ref 36.0–46.0)
HEMOGLOBIN: 6.8 g/dL — AB (ref 12.0–15.0)
MCH: 31.1 pg (ref 26.0–34.0)
MCHC: 32.5 g/dL (ref 30.0–36.0)
MCV: 95.4 fL (ref 78.0–100.0)
Platelets: 171 10*3/uL (ref 150–400)
RBC: 2.19 MIL/uL — ABNORMAL LOW (ref 3.87–5.11)
RDW: 16.6 % — ABNORMAL HIGH (ref 11.5–15.5)
WBC: 7.7 10*3/uL (ref 4.0–10.5)

## 2014-04-20 MED ORDER — ALBUTEROL SULFATE (2.5 MG/3ML) 0.083% IN NEBU
2.5000 mg | INHALATION_SOLUTION | Freq: Two times a day (BID) | RESPIRATORY_TRACT | Status: DC
  Administered 2014-04-20 – 2014-04-21 (×2): 2.5 mg via RESPIRATORY_TRACT
  Filled 2014-04-20: qty 3

## 2014-04-20 MED ORDER — CEFAZOLIN SODIUM 1-5 GM-% IV SOLN
1.0000 g | Freq: Once | INTRAVENOUS | Status: AC
  Administered 2014-04-20: 1 g via INTRAVENOUS
  Filled 2014-04-20: qty 50

## 2014-04-20 MED ORDER — AMLODIPINE BESYLATE 5 MG PO TABS
5.0000 mg | ORAL_TABLET | Freq: Every day | ORAL | Status: DC
  Administered 2014-04-21 – 2014-04-23 (×3): 5 mg via ORAL
  Filled 2014-04-20 (×3): qty 1

## 2014-04-20 MED ORDER — LABETALOL HCL 100 MG PO TABS
100.0000 mg | ORAL_TABLET | Freq: Every day | ORAL | Status: DC
  Administered 2014-04-21 – 2014-04-23 (×3): 100 mg via ORAL
  Filled 2014-04-20 (×3): qty 1

## 2014-04-20 MED ORDER — CEFAZOLIN SODIUM-DEXTROSE 2-3 GM-% IV SOLR
2.0000 g | INTRAVENOUS | Status: DC
  Administered 2014-04-21: 2 g via INTRAVENOUS
  Filled 2014-04-20 (×3): qty 50

## 2014-04-20 NOTE — Progress Notes (Addendum)
[facility] Progress Note   Subjective: Blood cx's + for GPC, subj fevers better  Filed Vitals:   04/20/14 0653 04/20/14 0807 04/20/14 0812 04/20/14 0903  BP: 101/51  116/59 110/55  Pulse: 90  84 84  Temp:   97.9 F (36.6 C)   TempSrc:   Oral   Resp: 27  15   Height:      Weight:      SpO2: 94% 100% 100%    Exam: More comfortable today, no distress, sitting up in bed No jvd  Chest scant basilar rales bilat RRR no MRG  Abd soft, NTND  No pitting edema Neuro is nf, Ox 3 LUA AVF patent w no signs of erythema, abcess or drainage  CXR 7/13 bilat mild IS pulm edema   HD: TTS [place]  4h  72kg  2/2.25 [place]   400/A1.5   LUA AVF   Heparin 2400  Hect 1 ug TIW, Aranesp 200 mcg q Tues   Assessment:  1 Vol excess / pulm edema- resolved 2 Fevers / gram +bacteremia- unclear source, AVF not grossly infected on exam, w/u in progress 3 Hyperkalemia- resolved 4 ESRD on HD 5 DM2 on insulin 6 HTN on 3 BP meds 7 Anemia on aranesp  8 HPTH on phoslo, vit D 9 Home O2, hx pulm TB  Plan- cont abx, echo pending, HD tomorrow; have decreased BP meds to get more fluid off    Kelly Splinter MD  pager [phone]    cell [phone]  04/20/2014, 10:45 AM     Recent Labs Lab 04/18/14 1720 04/18/14 1940 04/19/14 0336 04/20/14 0050  NA 125*  --  129* 133*  K >7.7*  --  5.2 4.8  CL 82*  --  86* 91*  CO2 22  --  24 27  GLUCOSE 193*  --  176* 144*  BUN 52*  --  30* 20  CREATININE 8.36* 8.41* 5.81* 3.98*  CALCIUM 9.0  --  8.9 8.7   No results found for this basename: AST, ALT, ALKPHOS, BILITOT, PROT, ALBUMIN,  in the last 168 hours  Recent Labs Lab 04/18/14 1720 04/18/14 1940 04/20/14 0050  WBC 11.7* 11.1* 7.7  HGB 7.5* 7.2* 6.8*  HCT 22.5* 21.1* 20.9*  MCV 93.0 91.3 95.4  PLT 238 239 171   . albuterol  2.5 mg Nebulization Q6H  . amLODipine  10 mg Oral Daily  . calcium acetate  2,001 mg Oral TID WC  .  ceFAZolin (ANCEF) IV  1 g Intravenous Once  . [START ON  04/21/2014]  ceFAZolin (ANCEF) IV  2 g Intravenous Q T,Th,Sa-HD  . Chlorhexidine Gluconate Cloth  6 each Topical Q0600  . cloNIDine  0.1 mg Oral Daily  . darbepoetin (ARANESP) injection - DIALYSIS  200 mcg Intravenous Q Tue-HD  . doxercalciferol  1 mcg Intravenous Q T,Th,Sa-HD  . escitalopram  10 mg Oral Daily  . heparin  2,400 Units Intravenous Once  . heparin  5,000 Units Subcutaneous 3 times per day  . insulin aspart  0-5 Units Subcutaneous QHS  . insulin aspart  0-9 Units Subcutaneous TID WC  . insulin glargine  50 Units Subcutaneous QHS  . labetalol  300 mg Oral Daily  . metoCLOPramide  5 mg Oral QID  . mupirocin ointment  1 application Nasal BID  . simvastatin  20 mg Oral Q M,W,F  . sodium chloride  3 mL Intravenous Q12H  . sodium chloride  3 mL Intravenous Q12H  . [  START ON 04/21/2014] vancomycin  750 mg Intravenous Q T,Th,Sa-HD     sodium chloride, acetaminophen, albuterol, diphenhydrAMINE, HYDROcodone-acetaminophen, sodium chloride

## 2014-04-20 NOTE — Progress Notes (Signed)
  Echocardiogram 2D Echocardiogram has been performed.  [name] 04/20/2014, 4:41 PM

## 2014-04-20 NOTE — Progress Notes (Signed)
ANTIBIOTIC CONSULT NOTE - INITIAL  Pharmacy Consult for Vancomycin and ancef Indication: Staph bacteremia  Allergies  Allergen Reactions  . Ace Inhibitors Cough    Patient Measurements: Height: 5\' 3"  (160 cm) Weight: 165 lb 5.5 oz (75 kg) IBW/kg (Calculated) : 52.4  Vital Signs: Temp: 97.9 F (36.6 C) (07/15 0812) Temp src: Oral (07/15 0812) BP: 110/55 mmHg (07/15 0903) Pulse Rate: 84 (07/15 0903) Intake/Output from previous day: 07/14 0701 - 07/15 0700 In: 516 [P.O.:433; I.V.:33; IV Piggyback:50] Out: 3743  Intake/Output from this shift: Total I/O In: 3 [I.V.:3] Out: -   Labs:  Recent Labs  04/18/14 1720 04/18/14 1940 04/19/14 0336 04/20/14 0050  WBC 11.7* 11.1*  --  7.7  HGB 7.5* 7.2*  --  6.8*  PLT 238 239  --  171  CREATININE 8.36* 8.41* 5.81* 3.98*   Estimated Creatinine Clearance: 18.8 ml/min (by C-G formula based on Cr of 3.98). No results found for this basename: VANCOTROUGH, Corlis Leak, VANCORANDOM, GENTTROUGH, GENTPEAK, GENTRANDOM, TOBRATROUGH, TOBRAPEAK, TOBRARND, AMIKACINPEAK, AMIKACINTROU, AMIKACIN,  in the last 72 hours   Microbiology: Recent Results (from the past 720 hour(s))  CULTURE, BLOOD (ROUTINE X 2)     Status: None   Collection Time    04/18/14  7:40 PM      Result Value Ref Range Status   Specimen Description BLOOD ARM RIGHT   Final   Special Requests BOTTLES DRAWN AEROBIC AND ANAEROBIC 5CC   Final   Culture  Setup Time     Final   Value: 04/19/2014 00:46     Performed at [facility]   Culture     Final   Value: STAPHYLOCOCCUS AUREUS     Note: RIFAMPIN AND GENTAMICIN SHOULD NOT BE USED AS SINGLE DRUGS FOR TREATMENT OF STAPH INFECTIONS.     Note: Gram Stain Report Called to,Read Back By and Verified With: TATA CARBONE@1116  ON [id number] BY [facility]     Performed at [facility]   Report Status PENDING   Incomplete  CULTURE, BLOOD (ROUTINE X 2)     Status: None   Collection Time    04/18/14  7:50 PM      Result Value Ref  Range Status   Specimen Description BLOOD HAND RIGHT   Final   Special Requests BOTTLES DRAWN AEROBIC ONLY 5CC   Final   Culture  Setup Time     Final   Value: 04/19/2014 00:46     Performed at [facility]   Culture     Final   Value: STAPHYLOCOCCUS AUREUS     Note: Gram Stain Report Called to,Read Back By and Verified With: TATA CARBONE@1116  ON [id number] BY [facility]     Performed at [facility]   Report Status PENDING   Incomplete  MRSA PCR SCREENING     Status: Abnormal   Collection Time    04/18/14 10:30 PM      Result Value Ref Range Status   MRSA by PCR POSITIVE (*) NEGATIVE Final   Comment:            The GeneXpert MRSA Assay (FDA     approved for NASAL specimens     only), is one component of a     comprehensive MRSA colonization     surveillance program. It is not     intended to diagnose MRSA     infection nor to guide or     monitor treatment for     MRSA infections.  RESULT CALLED TO, READ BACK BY AND VERIFIED WITH:     PETTIFORD,A RN 0109 04/19/14 MITCHELL,L    Medical History: Past Medical History  Diagnosis Date  . HTN (hypertension)     not well controlled  . 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 History of blood transfusion     "lots of times" (01/27/2013)  . CAP (community acquired pneumonia) 2010  . Type II diabetes mellitus   . Anemia   . Ovarian tumor, malignant 2005    resected in [place]' pt denies that this was cancer on 01/27/2013  . Diabetic retinopathy   . On home oxygen therapy     "5L; 24/7" (01/25/2014)  . ESRD on hemodialysis 08/25/2013    Started HD in 2008.  Had [place] transplant in 2010 at [facility].  Went back on HD in 2012.  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  The kidney was left in.  She now gets HD at [facility] on TTS schedule.     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  Both organs have since rejected, see "ESRD" for details.     Medications:  Prescriptions prior to admission  Medication Sig Dispense Refill  . acetaminophen (TYLENOL) 500 MG tablet Take 1,000 mg by mouth every 6 (six) hours as needed for moderate pain.      [name] amLODipine (NORVASC) 10 MG tablet Take 10 mg by mouth at bedtime.       [name] aspirin EC 81 MG tablet Take 81 mg by mouth at bedtime.       . calcium acetate (PHOSLO) 667 MG capsule Take 3 capsules (2,001 mg total) by mouth 3 (three) times daily with meals.      . cloNIDine (CATAPRES) 0.1 MG tablet Take 0.1 mg by mouth daily.       . diphenhydrAMINE (BENADRYL) 25 mg capsule Take 25 mg by mouth daily as needed for allergies.      [name] escitalopram (LEXAPRO) 20 MG tablet Take 10 mg by mouth daily.       . insulin aspart (NOVOLOG) 100 UNIT/ML injection Inject 12 Units into the skin 2 (two) times daily with breakfast and lunch.       . insulin glargine (LANTUS) 100 UNIT/ML injection Inject 0.63 mLs (63 Units total) into the skin at bedtime.  10 mL  11  . labetalol (NORMODYNE) 300 MG tablet Take 300 mg by mouth daily.       . metoCLOPramide (REGLAN) 5 MG tablet Take 5 mg by mouth 4 (four) times daily.      . simvastatin (ZOCOR) 20 MG tablet Take 20 mg by mouth every Monday, Wednesday, and Friday.       Assessment: 38 yo female how was admitted for sepsis. Blood cultures have grown out 2/2 SA. ID has added ancef to vanc until sens are back. She get HD on TTS.   Goal of Therapy:  Vancomycin pre-HD level 15-20  Plan:   Vancomycin 750 mg after each HD Ancef 1g IV x1 then 2g after each HD

## 2014-04-20 NOTE — Clinical Documentation Improvement (Signed)
Clinical Indicators:  Chief Complaint per ED Record - "chills, body aches, general malaise"  WBC 11.7 on admission  Pulse Rate > 90 bpm on admission  Resp Rate in the mid 20's to mid 30's on admission  Temperature of 101.5 axillary within 7 hours of admission  Blood Cultures x 2 drawn on admission growing Staph Aureus without obvious source per ID Consult    Possible Clinical Conditions:   - Sepsis, Present on Admission (POA)   - SIRS, Present on Admission (POA)   - Bacteremia, Present on Admission (POA)   - Other Condition   - Unable to Clinically Determine  Thank You,  Erling Conte ,RN BSN CCDS Certified Clinical Documentation Specialist:  [facility]

## 2014-04-20 NOTE — Progress Notes (Addendum)
PROGRESS NOTE  [name] FWY:[id number] DOB: March 11, 1976 DOA: 04/18/2014 PCP: Tommy Medal, MD  38 y.o. year old female with significant past medical history of ESRD w/ HD TTS (prior noncompliance), IDDM, HTN, chronic resp failure on 5L [place] at home, hx/o combined kidney and pancreas tranplant (failed) presenting with hyperkalemia, dyspnea. The patient admitted May 26 through May 27 for volume overload in the setting of dialysis noncompliance.- approximately 4 L removed. Patient states that since hospital discharge, she has been compliant with hemodialysis.  Blood cultures came back positive x 2   Assessment/Plan: Hyperkalemia: resolved with dialysis  Hyponatremia- 125 upon initial presentation; improved after dialysis   Acute on chronic resp failure: Suspect likely secondary to mild volume overload on presentation now doubt HCAPNA  leukocytosis on presentation with fever- +bacteremia- vanc/zosyn- will d/c zosyn as gram + cocci 2/2  ESRD: HD per nephro  Insulin-dependent diabetes: Sliding scale insulin. Hemoglobin A1c.   Hypertension: resume home meds  Anemia: ARD.  No overt signs of bleeding. Continue to follow.   Gram + cocci in 2/2 blood cultures- on IV Abx- monitor for culture results -echo  Code Status: full Family Communication: patient Disposition Plan: tx to med surg floor   Consultants:  renal  Procedures:  HD   HPI/Subjective: Feeling better today, no pain  Objective: Filed Vitals:   04/20/14 0653  BP: 101/51  Pulse: 90  Temp:   Resp: 27    Intake/Output Summary (Last 24 hours) at 04/20/14 0810 Last data filed at 04/20/14 0103  Gross per 24 hour  Intake    506 ml  Output   3743 ml  Net  -3237 ml   Filed Weights   04/19/14 1726 04/19/14 2049 04/20/14 0431  Weight: 77.7 kg (171 lb 4.8 oz) 74 kg (163 lb 2.3 oz) 75 kg (165 lb 5.5 oz)    Exam:   General:  A+Ox3, NAD  Cardiovascular: rrr  Respiratory: clear, no wheezing  Abdomen: +BS,  soft  Musculoskeletal: moves all 4 ext  Data Reviewed: Basic Metabolic Panel:  Recent Labs Lab 04/18/14 1720 04/18/14 1940 04/19/14 0336 04/20/14 0050  NA 125*  --  129* 133*  K >7.7*  --  5.2 4.8  CL 82*  --  86* 91*  CO2 22  --  24 27  GLUCOSE 193*  --  176* 144*  BUN 52*  --  30* 20  CREATININE 8.36* 8.41* 5.81* 3.98*  CALCIUM 9.0  --  8.9 8.7   Liver Function Tests: No results found for this basename: AST, ALT, ALKPHOS, BILITOT, PROT, ALBUMIN,  in the last 168 hours No results found for this basename: LIPASE, AMYLASE,  in the last 168 hours No results found for this basename: AMMONIA,  in the last 168 hours CBC:  Recent Labs Lab 04/18/14 1720 04/18/14 1940 04/20/14 0050  WBC 11.7* 11.1* 7.7  HGB 7.5* 7.2* 6.8*  HCT 22.5* 21.1* 20.9*  MCV 93.0 91.3 95.4  PLT 238 239 171   Cardiac Enzymes:  Recent Labs Lab 04/18/14 1920 04/19/14 0114 04/19/14 0745  TROPONINI <0.30 <0.30 <0.30   BNP (last 3 results)  Recent Labs  04/18/14 1938  PROBNP >70000.0*   CBG:  Recent Labs Lab 04/19/14 1639 04/19/14 2136 04/19/14 2138 04/19/14 2358 04/20/14 0412  GLUCAP 107* 250* 91 180* 70    Recent Results (from the past 240 hour(s))  CULTURE, BLOOD (ROUTINE X 2)     Status: None   Collection Time    04/18/14  7:40 PM      Result Value Ref Range Status   Specimen Description BLOOD ARM RIGHT   Final   Special Requests BOTTLES DRAWN AEROBIC AND ANAEROBIC 5CC   Final   Culture  Setup Time     Final   Value: 04/19/2014 00:46     Performed at [facility]   Culture     Final   Value: GRAM POSITIVE COCCI IN CLUSTERS     Note: Gram Stain Report Called to,Read Back By and Verified With: TATA CARBONE@1116  ON [id number] BY [facility]     Performed at [facility]   Report Status PENDING   Incomplete  CULTURE, BLOOD (ROUTINE X 2)     Status: None   Collection Time    04/18/14  7:50 PM      Result Value Ref Range Status   Specimen Description BLOOD HAND RIGHT    Final   Special Requests BOTTLES DRAWN AEROBIC ONLY 5CC   Final   Culture  Setup Time     Final   Value: 04/19/2014 00:46     Performed at [facility]   Culture     Final   Value: GRAM POSITIVE COCCI IN CLUSTERS     Note: Gram Stain Report Called to,Read Back By and Verified With: TATA CARBONE@1116  ON [id number] BY [facility]     Performed at [facility]   Report Status PENDING   Incomplete  MRSA PCR SCREENING     Status: Abnormal   Collection Time    04/18/14 10:30 PM      Result Value Ref Range Status   MRSA by PCR POSITIVE (*) NEGATIVE Final   Comment:            The GeneXpert MRSA Assay (FDA     approved for NASAL specimens     only), is one component of a     comprehensive MRSA colonization     surveillance program. It is not     intended to diagnose MRSA     infection nor to guide or     monitor treatment for     MRSA infections.     RESULT CALLED TO, READ BACK BY AND VERIFIED WITH:     PETTIFORD,A RN 0109 04/19/14 MITCHELL,L     Studies: Dg Chest Portable 1 View  04/18/2014   CLINICAL DATA:  Shortness of breath.  Pain in chest.  EXAM: PORTABLE CHEST - 1 VIEW  COMPARISON:  02/28/2014  FINDINGS: Enlarged cardiopericardial silhouette noted with indistinct pulmonary vasculature. Mild interstitial accentuation is noted but this is improved compared to prior.  Mildly low lung volumes.  No definite pleural effusion.  IMPRESSION: 1. Mild to moderate enlargement of the cardiopericardial silhouette with mild interstitial edema, improved from prior.   Electronically Signed   By: Sherryl Barters M.D.   On: 04/18/2014 18:04    Scheduled Meds: . albuterol  2.5 mg Nebulization Q6H  . amLODipine  10 mg Oral Daily  . calcium acetate  2,001 mg Oral TID WC  . Chlorhexidine Gluconate Cloth  6 each Topical Q0600  . cloNIDine  0.1 mg Oral Daily  . darbepoetin (ARANESP) injection - DIALYSIS  200 mcg Intravenous Q Tue-HD  . doxercalciferol  1 mcg Intravenous Q T,Th,Sa-HD  .  escitalopram  10 mg Oral Daily  . heparin  2,400 Units Intravenous Once  . heparin  5,000 Units Subcutaneous 3 times per day  . insulin aspart  0-5 Units Subcutaneous  QHS  . insulin aspart  0-9 Units Subcutaneous TID WC  . insulin glargine  50 Units Subcutaneous QHS  . labetalol  300 mg Oral Daily  . metoCLOPramide  5 mg Oral QID  . mupirocin ointment  1 application Nasal BID  . piperacillin-tazobactam (ZOSYN)  IV  2.25 g Intravenous Q8H  . simvastatin  20 mg Oral Q M,W,F  . sodium chloride  3 mL Intravenous Q12H  . sodium chloride  3 mL Intravenous Q12H  . [START ON 04/21/2014] vancomycin  750 mg Intravenous Q T,Th,Sa-HD   Continuous Infusions:  Antibiotics Given (last 72 hours)   Date/Time Action Medication Dose Rate   04/19/14 0413 Given   piperacillin-tazobactam (ZOSYN) IVPB 2.25 g 2.25 g 100 mL/hr   04/19/14 0450 Given   vancomycin (VANCOCIN) 1,500 mg in sodium chloride 0.9 % 500 mL IVPB 1,500 mg 250 mL/hr   04/19/14 0920 Given   piperacillin-tazobactam (ZOSYN) IVPB 2.25 g 2.25 g 100 mL/hr   04/19/14 1705 Given   piperacillin-tazobactam (ZOSYN) IVPB 2.25 g 2.25 g 100 mL/hr   04/20/14 0103 Given   piperacillin-tazobactam (ZOSYN) IVPB 2.25 g 2.25 g 100 mL/hr      Active Problems:   DM (diabetes mellitus)   ESRD on hemodialysis   Hyperkalemia   Hyponatremia    Time spent: 35 min    Gearl Kimbrough, [facility] Pager [phone]. If 7PM-7AM, please contact night-coverage at [email or web address], password [facility] 04/20/2014, 8:10 AM  LOS: 2 days

## 2014-04-20 NOTE — Progress Notes (Signed)
CRITICAL VALUE ALERT  Critical value received:  Hemoglobin 6.8  Date of notification:  04/20/2014   Time of notification:  0122  Critical value read back: yes  Nurse who received alert:  Reatha Armour RN  MD notified (1st page):  Forrest Moron  Time of first page:  0135  MD notified (2nd page):  Time of second page:  Responding MD:    Time MD responded:  [phone]

## 2014-04-20 NOTE — Consult Note (Signed)
[facility]    Date of Admission:  04/18/2014   Total days of antibiotics 3        Day 3               Reason for Consult: Automatic consultation for staph aureus bacteremia    Referring Physician: Dr. Eulogio Bear  Principal Problem:   Staphylococcus aureus bacteremia Active Problems:   HYPERTENSION   Anemia   DM (diabetes mellitus)   ESRD on hemodialysis   Hyperkalemia   Hyponatremia   History of simultaneous kidney and pancreas transplant   . albuterol  2.5 mg Nebulization Q6H  . amLODipine  10 mg Oral Daily  . calcium acetate  2,001 mg Oral TID WC  . Chlorhexidine Gluconate Cloth  6 each Topical Q0600  . cloNIDine  0.1 mg Oral Daily  . darbepoetin (ARANESP) injection - DIALYSIS  200 mcg Intravenous Q Tue-HD  . doxercalciferol  1 mcg Intravenous Q T,Th,Sa-HD  . escitalopram  10 mg Oral Daily  . heparin  2,400 Units Intravenous Once  . heparin  5,000 Units Subcutaneous 3 times per day  . insulin aspart  0-5 Units Subcutaneous QHS  . insulin aspart  0-9 Units Subcutaneous TID WC  . insulin glargine  50 Units Subcutaneous QHS  . labetalol  300 mg Oral Daily  . metoCLOPramide  5 mg Oral QID  . mupirocin ointment  1 application Nasal BID  . simvastatin  20 mg Oral Q M,W,F  . sodium chloride  3 mL Intravenous Q12H  . sodium chloride  3 mL Intravenous Q12H  . [START ON 04/21/2014] vancomycin  750 mg Intravenous Q T,Th,Sa-HD    Recommendations: 1. Continue vancomycin 2. Start cefazolin pending antibiotic susceptibility results 3. Repeat blood cultures 4. Await results of TTE   Assessment: [name] has staph aureus bacteremia without an obvious source. I will add cefazolin to vancomycin pending susceptibilities to give optimal coverage for both MSSA and MRSA. She needs repeat blood cultures and echocardiography. I will follow with you.    HPI: [name] is a 38 y.o. female with end-stage renal disease on hemodialysis who had sudden  onset of weakness, fever, chills and sweats 3 days ago. She was admitted and found to have a temperature of 101.5. Both admission blood cultures are growing staph aureus. She is feeling better today.   Review of Systems: Constitutional: positive for chills, fevers and sweats, negative for anorexia and weight loss Eyes: negative Ears, nose, mouth, throat, and face: negative Respiratory: no change in chronic shortness of breath Cardiovascular: negative Gastrointestinal: negative Genitourinary:negative, she does not make any urine  Past Medical History  Diagnosis Date  . HTN (hypertension)     not well controlled  . 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 History of blood transfusion     "lots of times" (01/27/2013)  . CAP (community acquired pneumonia) 2010  . Type II diabetes mellitus   . Anemia   . Ovarian tumor, malignant 2005    resected in [place]' pt denies that this was cancer on 01/27/2013  . Diabetic retinopathy   . On home oxygen therapy     "5L; 24/7" (01/25/2014)  . ESRD on hemodialysis 08/25/2013    Started HD in 2008.  Had [place] transplant in 2010 at [facility].  Went back on HD  in 2012.  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  The kidney was left in.  She now gets HD at [facility] on TTS schedule.     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  Both organs have since rejected, see "ESRD" for details.     History  Substance Use Topics  . Smoking status: Never Smoker   . Smokeless tobacco: Never Used  . Alcohol Use: No    Family History  Problem Relation Age of Onset  . Diabetes Mother   . Liver cancer Father   . Cancer Father    Allergies  Allergen Reactions  . Ace Inhibitors Cough    OBJECTIVE: Blood pressure 110/55, pulse 84, temperature 97.9 F (36.6 C), temperature source Oral, resp. rate 15,  height 5\' 3"  (1.6 m), weight 165 lb 5.5 oz (75 kg), SpO2 100.00%. General: She is sleeping but arouses easily. She is in no distress Skin: No rash or obvious splinter hemorrhages but she has partial nail polish on her fingernails and toes Oral: No oropharyngeal lesions Lungs: Clear Cor: Distant but regular S1 and S2 with no murmurs Abdomen: Healed midline scar. Soft and nontender without palpable masses Joints and extremities: No acute abnormalities noted. There is no sign of infection in her left upper arm AV fistula  Lab Results Lab Results  Component Value Date   WBC 7.7 04/20/2014   HGB 6.8* 04/20/2014   HCT 20.9* 04/20/2014   MCV 95.4 04/20/2014   PLT 171 04/20/2014    Lab Results  Component Value Date   CREATININE 3.98* 04/20/2014   BUN 20 04/20/2014   NA 133* 04/20/2014   K 4.8 04/20/2014   CL 91* 04/20/2014   CO2 27 04/20/2014    Lab Results  Component Value Date   ALT 14 03/01/2014   AST 14 03/01/2014   ALKPHOS 64 03/01/2014   BILITOT 0.6 03/01/2014     Microbiology: Recent Results (from the past 240 hour(s))  CULTURE, BLOOD (ROUTINE X 2)     Status: None   Collection Time    04/18/14  7:40 PM      Result Value Ref Range Status   Specimen Description BLOOD ARM RIGHT   Final   Special Requests BOTTLES DRAWN AEROBIC AND ANAEROBIC 5CC   Final   Culture  Setup Time     Final   Value: 04/19/2014 00:46     Performed at [facility]   Culture     Final   Value: STAPHYLOCOCCUS AUREUS     Note: RIFAMPIN AND GENTAMICIN SHOULD NOT BE USED AS SINGLE DRUGS FOR TREATMENT OF STAPH INFECTIONS.     Note: Gram Stain Report Called to,Read Back By and Verified With: TATA CARBONE@1116  ON [id number] BY [facility]     Performed at [facility]   Report Status PENDING   Incomplete  CULTURE, BLOOD (ROUTINE X 2)     Status: None   Collection Time    04/18/14  7:50 PM      Result Value Ref Range Status   Specimen Description BLOOD HAND RIGHT   Final   Special Requests BOTTLES DRAWN  AEROBIC ONLY 5CC   Final   Culture  Setup Time     Final   Value: 04/19/2014 00:46     Performed at [facility]   Culture     Final   Value: STAPHYLOCOCCUS AUREUS  Note: Gram Stain Report Called to,Read Back By and Verified With: TATA CARBONE@1116  ON [id number] BY [facility]     Performed at [facility]   Report Status PENDING   Incomplete  MRSA PCR SCREENING     Status: Abnormal   Collection Time    04/18/14 10:30 PM      Result Value Ref Range Status   MRSA by PCR POSITIVE (*) NEGATIVE Final   Comment:            The GeneXpert MRSA Assay (FDA     approved for NASAL specimens     only), is one component of a     comprehensive MRSA colonization     surveillance program. It is not     intended to diagnose MRSA     infection nor to guide or     monitor treatment for     MRSA infections.     RESULT CALLED TO, READ BACK BY AND VERIFIED WITH:     PETTIFORD,A RN 0109 04/19/14 [place], [place] [facility] [facility] [phone] pager   [phone] cell 04/20/2014, 9:52 AM

## 2014-04-21 LAB — CBC
HEMATOCRIT: 22.4 % — AB (ref 36.0–46.0)
HEMOGLOBIN: 7.4 g/dL — AB (ref 12.0–15.0)
MCH: 30 pg (ref 26.0–34.0)
MCHC: 33 g/dL (ref 30.0–36.0)
MCV: 90.7 fL (ref 78.0–100.0)
Platelets: 261 10*3/uL (ref 150–400)
RBC: 2.47 MIL/uL — ABNORMAL LOW (ref 3.87–5.11)
RDW: 15.5 % (ref 11.5–15.5)
WBC: 5.9 10*3/uL (ref 4.0–10.5)

## 2014-04-21 LAB — CULTURE, BLOOD (ROUTINE X 2)

## 2014-04-21 LAB — GLUCOSE, CAPILLARY
GLUCOSE-CAPILLARY: 170 mg/dL — AB (ref 70–99)
Glucose-Capillary: 99 mg/dL (ref 70–99)
Glucose-Capillary: 177 mg/dL — ABNORMAL HIGH (ref 70–99)
Glucose-Capillary: 115 mg/dL — ABNORMAL HIGH (ref 70–99)
Glucose-Capillary: 185 mg/dL — ABNORMAL HIGH (ref 70–99)

## 2014-04-21 MED ORDER — HEPARIN SODIUM (PORCINE) 1000 UNIT/ML DIALYSIS: 1000.0000 [IU] | INTRAMUSCULAR | Status: DC | PRN

## 2014-04-21 MED ORDER — DOXERCALCIFEROL 4 MCG/2ML IV SOLN
INTRAVENOUS | Status: AC
  Administered 2014-04-21: 1 ug
  Filled 2014-04-21: qty 2

## 2014-04-21 MED ORDER — LIDOCAINE-PRILOCAINE 2.5-2.5 % EX CREA
1.0000 "application " | TOPICAL_CREAM | CUTANEOUS | Status: DC | PRN
  Filled 2014-04-21: qty 5

## 2014-04-21 MED ORDER — PENTAFLUOROPROP-TETRAFLUOROETH EX AERO: 1.0000 "application " | INHALATION_SPRAY | CUTANEOUS | Status: DC | PRN

## 2014-04-21 MED ORDER — NEPRO/CARBSTEADY PO LIQD
237.0000 mL | ORAL | Status: DC | PRN
  Filled 2014-04-21: qty 237

## 2014-04-21 MED ORDER — ALTEPLASE 2 MG IJ SOLR
2.0000 mg | Freq: Once | INTRAMUSCULAR | Status: DC | PRN
  Filled 2014-04-21: qty 2

## 2014-04-21 MED ORDER — SODIUM CHLORIDE 0.9 % IV SOLN: 100.0000 mL | INTRAVENOUS | Status: DC | PRN

## 2014-04-21 MED ORDER — HYDROCODONE-ACETAMINOPHEN 5-325 MG PO TABS
1.0000 | ORAL_TABLET | Freq: Four times a day (QID) | ORAL | Status: DC | PRN
  Administered 2014-04-21 – 2014-04-23 (×7): 1 via ORAL
  Filled 2014-04-21: qty 2
  Filled 2014-04-21 (×6): qty 1

## 2014-04-21 MED ORDER — HEPARIN SODIUM (PORCINE) 1000 UNIT/ML DIALYSIS: 2400.0000 [IU] | Freq: Once | INTRAMUSCULAR | Status: DC

## 2014-04-21 MED ORDER — LIDOCAINE HCL (PF) 1 % IJ SOLN: 5.0000 mL | INTRAMUSCULAR | Status: DC | PRN

## 2014-04-21 MED ORDER — RENA-VITE PO TABS
1.0000 | ORAL_TABLET | Freq: Every day | ORAL | Status: DC
Start: 2014-04-21 — End: 2014-04-23
  Administered 2014-04-21: 1 via ORAL
  Administered 2014-04-22: 21:00:00 via ORAL
  Filled 2014-04-21 (×3): qty 1

## 2014-04-21 NOTE — Progress Notes (Signed)
Subjective:   Feeling better, wants to go home  Objective Filed Vitals:   04/21/14 0957 04/21/14 1025 04/21/14 1054 04/21/14 1125  BP: 121/62 115/59 132/66 112/62  Pulse: 82 76 80 83  Temp:      TempSrc:      Resp:      Height:      Weight:      SpO2:       Physical Exam General: alert and oriented on HD, no acute distress Heart: RRR no murmur Lungs: CTA, unlabored Abdomen: soft, nontender + BS Extremities: no edema Dialysis Access:   L AVF patent on HD  HD: TTS [place]  4h 72kg 2/2.25 Bath 400/A1.5 LUA AVF Heparin 2400  Hect 1 ug TIW, Aranesp 200 mcg q Tues   Assessment/Plan: 1. Dyspnea/pulm edema- 2D echo- EF 50-55%  6800 off in HD today, got to edw. Lower at DC 2. Staphylococcus aureus bacteremia- ID following, Vanc and cefazolin. Afebrile. Repeat BC negative to date 2. ESRD -  TTS @ [facility]. HD now. K+ 4.8 3. Anemia -  hgb 6.8. Continue aranesp  Check CBC today 4. Secondary hyperparathyroidism - Ca+8.7  Cont Phoslo,and hectorol 5. HTN- 110/56 on norvasc and labetalol  6. Nutrition - renal diet, mulitvit 7. Hx failed kidney/panc tx 8. DM- per primary  Alicia Iron, NP [place] [phone] 04/21/2014,12:11 PM  LOS: 3 days   Pt seen, examined, agree w assess/plan as above with additions as indicated.  Kelly Splinter MD pager [phone]    cell [phone] 04/21/2014, 1:49 PM      Additional Objective Labs: Basic Metabolic Panel:  Recent Labs Lab 04/18/14 1720 04/18/14 1940 04/19/14 0336 04/20/14 0050  NA 125*  --  129* 133*  K >7.7*  --  5.2 4.8  CL 82*  --  86* 91*  CO2 22  --  24 27  GLUCOSE 193*  --  176* 144*  BUN 52*  --  30* 20  CREATININE 8.36* 8.41* 5.81* 3.98*  CALCIUM 9.0  --  8.9 8.7   Liver Function Tests: No results found for this basename: AST, ALT, ALKPHOS, BILITOT, PROT, ALBUMIN,  in the last 168 hours No results found for this basename: LIPASE, AMYLASE,  in the last 168 hours CBC:  Recent Labs Lab  04/18/14 1720 04/18/14 1940 04/20/14 0050  WBC 11.7* 11.1* 7.7  HGB 7.5* 7.2* 6.8*  HCT 22.5* 21.1* 20.9*  MCV 93.0 91.3 95.4  PLT 238 239 171   Blood Culture    Component Value Date/Time   SDES BLOOD RIGHT HAND 04/20/2014 1355   SPECREQUEST BOTTLES DRAWN AEROBIC ONLY 6CC 04/20/2014 1355   CULT  Value:        BLOOD CULTURE RECEIVED NO GROWTH TO DATE CULTURE WILL BE HELD FOR 5 DAYS BEFORE ISSUING A FINAL NEGATIVE REPORT Performed at [facility] 04/20/2014 1355   REPTSTATUS PENDING 04/20/2014 1355    Cardiac Enzymes:  Recent Labs Lab 04/18/14 1920 04/19/14 0114 04/19/14 0745  TROPONINI <0.30 <0.30 <0.30   CBG:  Recent Labs Lab 04/20/14 1137 04/20/14 1655 04/20/14 2044 04/21/14 0132 04/21/14 0740  GLUCAP 153* 97 166* 99 177*   [name] Studies: No results found for this basename: [name], TIBC, TRANSFERRIN, FERRITIN,  in the last 72 hours @lablastinr3 @ Studies/Results: No results found. Medications:   . amLODipine  5 mg Oral Daily  . calcium acetate  2,001 mg Oral TID WC  .  ceFAZolin (ANCEF) IV  2 g Intravenous Q  T,Th,Sa-HD  . Chlorhexidine Gluconate Cloth  6 each Topical Q0600  . darbepoetin (ARANESP) injection - DIALYSIS  200 mcg Intravenous Q Tue-HD  . doxercalciferol  1 mcg Intravenous Q T,Th,Sa-HD  . escitalopram  10 mg Oral Daily  . heparin  2,400 Units Intravenous Once  . [START ON 04/22/2014] heparin  2,400 Units Dialysis Once in dialysis  . heparin  5,000 Units Subcutaneous 3 times per day  . insulin aspart  0-5 Units Subcutaneous QHS  . insulin aspart  0-9 Units Subcutaneous TID WC  . insulin glargine  50 Units Subcutaneous QHS  . labetalol  100 mg Oral Daily  . metoCLOPramide  5 mg Oral QID  . mupirocin ointment  1 application Nasal BID  . simvastatin  20 mg Oral Q M,W,F  . sodium chloride  3 mL Intravenous Q12H  . sodium chloride  3 mL Intravenous Q12H  . vancomycin  750 mg Intravenous Q T,Th,Sa-HD

## 2014-04-21 NOTE — Progress Notes (Signed)
PROGRESS NOTE  [name] HAL:[id number] DOB: Feb 16, 1976 DOA: 04/18/2014 PCP: Tommy Medal, MD  38 y.o. 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 The patient admitted May 26 through May 27 for volume overload in the setting of dialysis noncompliance.- approximately 4 L removed. Patient states that since hospital discharge, she has been compliant with hemodialysis.  Blood cultures came back positive x 2   Assessment/Plan: Hyperkalemia: resolved with dialysis  Hyponatremia- 125 upon initial presentation; improved after dialysis   Acute on chronic resp failure: Suspect likely secondary to mild volume overload on presentation now doubt HCAPNA -on 5L O2 at home  leukocytosis on presentation with fever- +bacteremia:gram + cocci 2/2 -appreciate ID -TTE WNL -? Source -s/p repeat BC- NGTD  ESRD: HD per nephro  Insulin-dependent diabetes: Sliding scale insulin. Hemoglobin A1c.   Hypertension: resume home meds  Anemia: ARD.  No overt signs of bleeding. Continue to follow.     Code Status: full Family Communication: patient Disposition Plan:    Consultants:  renal  Procedures:  HD   HPI/Subjective: In dialysis, feeling much better  Objective: Filed Vitals:   04/21/14 0830  BP: 130/64  Pulse: 86  Temp:   Resp:     Intake/Output Summary (Last 24 hours) at 04/21/14 0848 Last data filed at 04/21/14 0136  Gross per 24 hour  Intake    846 ml  Output      0 ml  Net    846 ml   Filed Weights   04/20/14 2041 04/21/14 0500 04/21/14 0802  Weight: 74.999 kg (165 lb 5.5 oz) 77.111 kg (170 lb) 78.3 kg (172 lb 9.9 oz)    Exam:   General:  A+Ox3, NAD  Cardiovascular: rrr  Respiratory: clear, no wheezing  Abdomen: +BS, soft  Musculoskeletal: moves all 4 ext  Data  Reviewed: Basic Metabolic Panel:  Recent Labs Lab 04/18/14 1720 04/18/14 1940 04/19/14 0336 04/20/14 0050  NA 125*  --  129* 133*  K >7.7*  --  5.2 4.8  CL 82*  --  86* 91*  CO2 22  --  24 27  GLUCOSE 193*  --  176* 144*  BUN 52*  --  30* 20  CREATININE 8.36* 8.41* 5.81* 3.98*  CALCIUM 9.0  --  8.9 8.7   Liver Function Tests: No results found for this basename: AST, ALT, ALKPHOS, BILITOT, PROT, ALBUMIN,  in the last 168 hours No results found for this basename: LIPASE, AMYLASE,  in the last 168 hours No results found for this basename: AMMONIA,  in the last 168 hours CBC:  Recent Labs Lab 04/18/14 1720 04/18/14 1940 04/20/14 0050  WBC 11.7* 11.1* 7.7  HGB 7.5* 7.2* 6.8*  HCT 22.5* 21.1* 20.9*  MCV 93.0 91.3 95.4  PLT 238 239 171   Cardiac Enzymes:  Recent Labs Lab 04/18/14 1920 04/19/14 0114 04/19/14 0745  TROPONINI <0.30 <0.30 <0.30   BNP (last 3 results)  Recent Labs  04/18/14 1938  PROBNP >70000.0*   CBG:  Recent Labs Lab 04/20/14 1137 04/20/14 1655 04/20/14 2044 04/21/14 0132 04/21/14 0740  GLUCAP 153* 97 166* 99 177*    Recent Results (from the past 240 hour(s))  CULTURE, BLOOD (ROUTINE X 2)     Status: None   Collection Time    04/18/14  7:40 PM  Result Value Ref Range Status   Specimen Description BLOOD ARM RIGHT   Final   Special Requests BOTTLES DRAWN AEROBIC AND ANAEROBIC 5CC   Final   Culture  Setup Time     Final   Value: 04/19/2014 00:46     Performed at [facility]   Culture     Final   Value: STAPHYLOCOCCUS AUREUS     Note: RIFAMPIN AND GENTAMICIN SHOULD NOT BE USED AS SINGLE DRUGS FOR TREATMENT OF STAPH INFECTIONS.     Note: Gram Stain Report Called to,Read Back By and Verified With: TATA CARBONE@1116  ON [id number] BY [facility]     Performed at [facility]   Report Status 04/21/2014 FINAL   Final   Organism ID, Bacteria STAPHYLOCOCCUS AUREUS   Final  CULTURE, BLOOD (ROUTINE X 2)     Status: None    Collection Time    04/18/14  7:50 PM      Result Value Ref Range Status   Specimen Description BLOOD HAND RIGHT   Final   Special Requests BOTTLES DRAWN AEROBIC ONLY 5CC   Final   Culture  Setup Time     Final   Value: 04/19/2014 00:46     Performed at [facility]   Culture     Final   Value: STAPHYLOCOCCUS AUREUS     Note: SUSCEPTIBILITIES PERFORMED ON PREVIOUS CULTURE WITHIN THE LAST 5 DAYS.     Note: Gram Stain Report Called to,Read Back By and Verified With: TATA CARBONE@1116  ON [id number] BY [facility]     Performed at [facility]   Report Status 04/21/2014 FINAL   Final  MRSA PCR SCREENING     Status: Abnormal   Collection Time    04/18/14 10:30 PM      Result Value Ref Range Status   MRSA by PCR POSITIVE (*) NEGATIVE Final   Comment:            The GeneXpert MRSA Assay (FDA     approved for NASAL specimens     only), is one component of a     comprehensive MRSA colonization     surveillance program. It is not     intended to diagnose MRSA     infection nor to guide or     monitor treatment for     MRSA infections.     RESULT CALLED TO, READ BACK BY AND VERIFIED WITH:     PETTIFORD,A RN 0109 04/19/14 MITCHELL,L  CULTURE, BLOOD (ROUTINE X 2)     Status: None   Collection Time    04/20/14  1:45 PM      Result Value Ref Range Status   Specimen Description BLOOD RIGHT ANTECUBITAL   Final   Special Requests BOTTLES DRAWN AEROBIC ONLY 8CC   Final   Culture  Setup Time     Final   Value: 04/20/2014 16:52     Performed at [facility]   Culture     Final   Value:        BLOOD CULTURE RECEIVED NO GROWTH TO DATE CULTURE WILL BE HELD FOR 5 DAYS BEFORE ISSUING A FINAL NEGATIVE REPORT     Performed at [facility]   Report Status PENDING   Incomplete  CULTURE, BLOOD (ROUTINE X 2)     Status: None   Collection Time    04/20/14  1:55 PM      Result Value Ref Range Status   Specimen Description BLOOD  RIGHT HAND   Final   Special Requests BOTTLES DRAWN  AEROBIC ONLY [facility]   Final   Culture  Setup Time     Final   Value: 04/20/2014 16:52     Performed at [facility]   Culture     Final   Value:        BLOOD CULTURE RECEIVED NO GROWTH TO DATE CULTURE WILL BE HELD FOR 5 DAYS BEFORE ISSUING A FINAL NEGATIVE REPORT     Performed at [facility]   Report Status PENDING   Incomplete     Studies: No results found.  Scheduled Meds: . amLODipine  5 mg Oral Daily  . calcium acetate  2,001 mg Oral TID WC  .  ceFAZolin (ANCEF) IV  2 g Intravenous Q T,Th,Sa-HD  . Chlorhexidine Gluconate Cloth  6 each Topical Q0600  . darbepoetin (ARANESP) injection - DIALYSIS  200 mcg Intravenous Q Tue-HD  . doxercalciferol  1 mcg Intravenous Q T,Th,Sa-HD  . escitalopram  10 mg Oral Daily  . heparin  2,400 Units Intravenous Once  . [START ON 04/22/2014] heparin  2,400 Units Dialysis Once in dialysis  . heparin  5,000 Units Subcutaneous 3 times per day  . insulin aspart  0-5 Units Subcutaneous QHS  . insulin aspart  0-9 Units Subcutaneous TID WC  . insulin glargine  50 Units Subcutaneous QHS  . labetalol  100 mg Oral Daily  . metoCLOPramide  5 mg Oral QID  . mupirocin ointment  1 application Nasal BID  . simvastatin  20 mg Oral Q M,W,F  . sodium chloride  3 mL Intravenous Q12H  . sodium chloride  3 mL Intravenous Q12H  . vancomycin  750 mg Intravenous Q T,Th,Sa-HD   Continuous Infusions:  Antibiotics Given (last 72 hours)   Date/Time Action Medication Dose Rate   04/19/14 0413 Given   piperacillin-tazobactam (ZOSYN) IVPB 2.25 g 2.25 g 100 mL/hr   04/19/14 0450 Given   vancomycin (VANCOCIN) 1,500 mg in sodium chloride 0.9 % 500 mL IVPB 1,500 mg 250 mL/hr   04/19/14 0920 Given   piperacillin-tazobactam (ZOSYN) IVPB 2.25 g 2.25 g 100 mL/hr   04/19/14 1705 Given   piperacillin-tazobactam (ZOSYN) IVPB 2.25 g 2.25 g 100 mL/hr   04/20/14 0103 Given   piperacillin-tazobactam (ZOSYN) IVPB 2.25 g 2.25 g 100 mL/hr   04/20/14 1218 Given    ceFAZolin (ANCEF) IVPB 1 g/50 mL premix 1 g 100 mL/hr      Principal Problem:   Staphylococcus aureus bacteremia Active Problems:   HYPERTENSION   Anemia   DM (diabetes mellitus)   ESRD on hemodialysis   Hyperkalemia   Hyponatremia   History of simultaneous kidney and pancreas transplant    Time spent: 25 min    Marthena Whitmyer, [facility] Pager [phone]. If 7PM-7AM, please contact night-coverage at [email or web address], password [facility] 04/21/2014, 8:48 AM  LOS: 3 days

## 2014-04-21 NOTE — Progress Notes (Signed)
O2 was off upon my entering room.  SpO2 checked.  Sats were 60%.  O2 replaced.  ALubterol treatment given, then stopped due to sats dropping again.  Replaced 5l [place].  Spo2 increased to 88%.

## 2014-04-21 NOTE — Progress Notes (Signed)
Upon entering room, patient found to be not wearing oxygen and talking into phone.  Requested oxygen to remain on and patient placed oxygen back on at 5L.  Educated patient on effects of low oxygen saturation.  Will continue to monitor throughout night.

## 2014-04-21 NOTE — Progress Notes (Signed)
Patient ID: [name], female   DOB: 1976-02-27, 38 y.o.   MRN: [id number]         [facility] for Infectious Disease    Date of Admission:  04/18/2014   Total days of antibiotics 4         Principal Problem:   Staphylococcus aureus bacteremia Active Problems:   HYPERTENSION   Anemia   DM (diabetes mellitus)   ESRD on hemodialysis   Hyperkalemia   Hyponatremia   History of simultaneous kidney and pancreas transplant   . amLODipine  5 mg Oral Daily  . calcium acetate  2,001 mg Oral TID WC  .  ceFAZolin (ANCEF) IV  2 g Intravenous Q T,Th,Sa-HD  . Chlorhexidine Gluconate Cloth  6 each Topical Q0600  . darbepoetin (ARANESP) injection - DIALYSIS  200 mcg Intravenous Q Tue-HD  . doxercalciferol  1 mcg Intravenous Q T,Th,Sa-HD  . escitalopram  10 mg Oral Daily  . heparin  2,400 Units Intravenous Once  . heparin  5,000 Units Subcutaneous 3 times per day  . insulin aspart  0-5 Units Subcutaneous QHS  . insulin aspart  0-9 Units Subcutaneous TID WC  . insulin glargine  50 Units Subcutaneous QHS  . labetalol  100 mg Oral Daily  . metoCLOPramide  5 mg Oral QID  . multivitamin  1 tablet Oral QHS  . mupirocin ointment  1 application Nasal BID  . simvastatin  20 mg Oral Q M,W,F  . sodium chloride  3 mL Intravenous Q12H  . sodium chloride  3 mL Intravenous Q12H    Objective: Temp:  [97.7 F (36.5 C)-98.7 F (37.1 C)] 98.7 F (37.1 C) (07/16 1722) Pulse Rate:  [73-100] 84 (07/16 1722) Resp:  [18-20] 18 (07/16 1722) BP: (110-159)/(56-76) 111/67 mmHg (07/16 1722) SpO2:  [88 %-100 %] 100 % (07/16 1722) Weight:  [158 lb 8.2 oz (71.9 kg)-172 lb 9.9 oz (78.3 kg)] 158 lb 8.2 oz (71.9 kg) (07/16 1202)  Lab Results Lab Results  Component Value Date   WBC 5.9 04/21/2014   HGB 7.4* 04/21/2014   HCT 22.4* 04/21/2014   MCV 90.7 04/21/2014   PLT 261 04/21/2014    Lab Results  Component Value Date   CREATININE 3.98* 04/20/2014   BUN 20 04/20/2014   NA 133* 04/20/2014   K 4.8 04/20/2014     CL 91* 04/20/2014   CO2 27 04/20/2014      Microbiology: Recent Results (from the past 240 hour(s))  CULTURE, BLOOD (ROUTINE X 2)     Status: None   Collection Time    04/18/14  7:40 PM      Result Value Ref Range Status   Specimen Description BLOOD ARM RIGHT   Final   Special Requests BOTTLES DRAWN AEROBIC AND ANAEROBIC 5CC   Final   Culture  Setup Time     Final   Value: 04/19/2014 00:46     Performed at [facility]   Culture     Final   Value: STAPHYLOCOCCUS AUREUS     Note: RIFAMPIN AND GENTAMICIN SHOULD NOT BE USED AS SINGLE DRUGS FOR TREATMENT OF STAPH INFECTIONS.     Note: Gram Stain Report Called to,Read Back By and Verified With: TATA CARBONE@1116  ON [id number] BY [facility]     Performed at [facility]   Report Status 04/21/2014 FINAL   Final   Organism ID, Bacteria STAPHYLOCOCCUS AUREUS   Final  CULTURE, BLOOD (ROUTINE X 2)     Status:  None   Collection Time    04/18/14  7:50 PM      Result Value Ref Range Status   Specimen Description BLOOD HAND RIGHT   Final   Special Requests BOTTLES DRAWN AEROBIC ONLY 5CC   Final   Culture  Setup Time     Final   Value: 04/19/2014 00:46     Performed at [facility]   Culture     Final   Value: STAPHYLOCOCCUS AUREUS     Note: SUSCEPTIBILITIES PERFORMED ON PREVIOUS CULTURE WITHIN THE LAST 5 DAYS.     Note: Gram Stain Report Called to,Read Back By and Verified With: TATA CARBONE@1116  ON [id number] BY [facility]     Performed at [facility]   Report Status 04/21/2014 FINAL   Final  MRSA PCR SCREENING     Status: Abnormal   Collection Time    04/18/14 10:30 PM      Result Value Ref Range Status   MRSA by PCR POSITIVE (*) NEGATIVE Final   Comment:            The GeneXpert MRSA Assay (FDA     approved for NASAL specimens     only), is one component of a     comprehensive MRSA colonization     surveillance program. It is not     intended to diagnose MRSA     infection nor to guide or     monitor treatment  for     MRSA infections.     RESULT CALLED TO, READ BACK BY AND VERIFIED WITH:     PETTIFORD,A RN 0109 04/19/14 MITCHELL,L  CULTURE, BLOOD (ROUTINE X 2)     Status: None   Collection Time    04/20/14  1:45 PM      Result Value Ref Range Status   Specimen Description BLOOD RIGHT ANTECUBITAL   Final   Special Requests BOTTLES DRAWN AEROBIC ONLY 8CC   Final   Culture  Setup Time     Final   Value: 04/20/2014 16:52     Performed at [facility]   Culture     Final   Value:        BLOOD CULTURE RECEIVED NO GROWTH TO DATE CULTURE WILL BE HELD FOR 5 DAYS BEFORE ISSUING A FINAL NEGATIVE REPORT     Performed at [facility]   Report Status PENDING   Incomplete  CULTURE, BLOOD (ROUTINE X 2)     Status: None   Collection Time    04/20/14  1:55 PM      Result Value Ref Range Status   Specimen Description BLOOD RIGHT HAND   Final   Special Requests BOTTLES DRAWN AEROBIC ONLY 6CC   Final   Culture  Setup Time     Final   Value: 04/20/2014 16:52     Performed at [facility]   Culture     Final   Value:        BLOOD CULTURE RECEIVED NO GROWTH TO DATE CULTURE WILL BE HELD FOR 5 DAYS BEFORE ISSUING A FINAL NEGATIVE REPORT     Performed at [facility]   Report Status PENDING   Incomplete   Assessment: She has MSSA bacteremia. I will continue cefazolin and stop vancomycin now. Repeat blood cultures are negative to date. Transthoracic echocardiogram does not show any evidence of endocarditis. She will need a transesophageal echocardiogram to help establish duration of therapy and prognosis.  Plan: 1. Continue  cefazolin alone 2. Recommend TEE  Michel Bickers, MD [facility] for Infectious [place] [facility] [phone] pager   [phone] cell 04/21/2014, 5:38 PM

## 2014-04-22 ENCOUNTER — Encounter (HOSPITAL_COMMUNITY): Payer: Self-pay | Admitting: *Deleted

## 2014-04-22 ENCOUNTER — Encounter (HOSPITAL_COMMUNITY)
Admission: EM | Disposition: A | Discharge status: Home / Self Care | Payer: Self-pay | Source: Home / Self Care | Attending: Internal Medicine

## 2014-04-22 DIAGNOSIS — A4901 Methicillin susceptible Staphylococcus aureus infection, unspecified site: Secondary | ICD-10-CM

## 2014-04-22 DIAGNOSIS — R7881 Bacteremia: Secondary | ICD-10-CM

## 2014-04-22 DIAGNOSIS — I059 Rheumatic mitral valve disease, unspecified: Secondary | ICD-10-CM

## 2014-04-22 HISTORY — PX: TEE WITHOUT CARDIOVERSION: SHX5443

## 2014-04-22 LAB — GLUCOSE, CAPILLARY
GLUCOSE-CAPILLARY: 126 mg/dL — AB (ref 70–99)
GLUCOSE-CAPILLARY: 178 mg/dL — AB (ref 70–99)
Glucose-Capillary: 85 mg/dL (ref 70–99)
Glucose-Capillary: 130 mg/dL — ABNORMAL HIGH (ref 70–99)

## 2014-04-22 SURGERY — ECHOCARDIOGRAM, TRANSESOPHAGEAL
Anesthesia: Moderate Sedation

## 2014-04-22 MED ORDER — MIDAZOLAM HCL 5 MG/ML IJ SOLN
INTRAMUSCULAR | Status: AC
  Filled 2014-04-22: qty 2

## 2014-04-22 MED ORDER — FENTANYL CITRATE 0.05 MG/ML IJ SOLN
INTRAMUSCULAR | Status: DC | PRN
  Administered 2014-04-22 (×2): 25 ug via INTRAVENOUS

## 2014-04-22 MED ORDER — SODIUM CHLORIDE 0.9 % IV SOLN
INTRAVENOUS | Status: DC
  Administered 2014-04-22: 10:00:00 via INTRAVENOUS

## 2014-04-22 MED ORDER — LIDOCAINE VISCOUS 2 % MT SOLN
OROMUCOSAL | Status: AC
  Filled 2014-04-22: qty 15

## 2014-04-22 MED ORDER — MIDAZOLAM HCL 10 MG/2ML IJ SOLN
INTRAMUSCULAR | Status: DC | PRN
  Administered 2014-04-22 (×2): 2 mg via INTRAVENOUS

## 2014-04-22 MED ORDER — LIDOCAINE VISCOUS 2 % MT SOLN
OROMUCOSAL | Status: DC | PRN
  Administered 2014-04-22: 1 via OROMUCOSAL

## 2014-04-22 MED ORDER — FENTANYL CITRATE 0.05 MG/ML IJ SOLN
INTRAMUSCULAR | Status: AC
  Filled 2014-04-22: qty 2

## 2014-04-22 NOTE — Progress Notes (Signed)
ANTIBIOTIC CONSULT NOTE - INITIAL  Pharmacy Consult for Ancef Indication: MSSA bacteremia  Allergies  Allergen Reactions  . Ace Inhibitors Cough    Patient Measurements: Height: 5\' 3"  (160 cm) Weight: 164 lb 7.4 oz (74.6 kg) IBW/kg (Calculated) : 52.4 Adjusted Body Weight:    Vital Signs: Temp: 98.4 F (36.9 C) (07/17 1000) Temp src: Oral (07/17 1000) BP: 146/76 mmHg (07/17 1000) Pulse Rate: 85 (07/17 0442) Intake/Output from previous day: 07/16 0701 - 07/17 0700 In: 240 [P.O.:240] Out: 6200  Intake/Output from this shift:    Labs:  Recent Labs  04/20/14 0050 04/21/14 1332  WBC 7.7 5.9  HGB 6.8* 7.4*  PLT 171 261  CREATININE 3.98*  --    Estimated Creatinine Clearance: 18.7 ml/min (by C-G formula based on Cr of 3.98). No results found for this basename: VANCOTROUGH, Corlis Leak, VANCORANDOM, GENTTROUGH, GENTPEAK, GENTRANDOM, TOBRATROUGH, TOBRAPEAK, TOBRARND, AMIKACINPEAK, AMIKACINTROU, AMIKACIN,  in the last 72 hours   Microbiology: Recent Results (from the past 720 hour(s))  CULTURE, BLOOD (ROUTINE X 2)     Status: None   Collection Time    04/18/14  7:40 PM      Result Value Ref Range Status   Specimen Description BLOOD ARM RIGHT   Final   Special Requests BOTTLES DRAWN AEROBIC AND ANAEROBIC 5CC   Final   Culture  Setup Time     Final   Value: 04/19/2014 00:46     Performed at [facility]   Culture     Final   Value: STAPHYLOCOCCUS AUREUS     Note: RIFAMPIN AND GENTAMICIN SHOULD NOT BE USED AS SINGLE DRUGS FOR TREATMENT OF STAPH INFECTIONS.     Note: Gram Stain Report Called to,Read Back By and Verified With: TATA CARBONE@1116  ON [id number] BY [facility]     Performed at [facility]   Report Status 04/21/2014 FINAL   Final   Organism ID, Bacteria STAPHYLOCOCCUS AUREUS   Final  CULTURE, BLOOD (ROUTINE X 2)     Status: None   Collection Time    04/18/14  7:50 PM      Result Value Ref Range Status   Specimen Description BLOOD HAND RIGHT   Final    Special Requests BOTTLES DRAWN AEROBIC ONLY 5CC   Final   Culture  Setup Time     Final   Value: 04/19/2014 00:46     Performed at [facility]   Culture     Final   Value: STAPHYLOCOCCUS AUREUS     Note: SUSCEPTIBILITIES PERFORMED ON PREVIOUS CULTURE WITHIN THE LAST 5 DAYS.     Note: Gram Stain Report Called to,Read Back By and Verified With: TATA CARBONE@1116  ON [id number] BY [facility]     Performed at [facility]   Report Status 04/21/2014 FINAL   Final  MRSA PCR SCREENING     Status: Abnormal   Collection Time    04/18/14 10:30 PM      Result Value Ref Range Status   MRSA by PCR POSITIVE (*) NEGATIVE Final   Comment:            The GeneXpert MRSA Assay (FDA     approved for NASAL specimens     only), is one component of a     comprehensive MRSA colonization     surveillance program. It is not     intended to diagnose MRSA     infection nor to guide or     monitor treatment for  MRSA infections.     RESULT CALLED TO, READ BACK BY AND VERIFIED WITH:     PETTIFORD,A RN 0109 04/19/14 MITCHELL,L  CULTURE, BLOOD (ROUTINE X 2)     Status: None   Collection Time    04/20/14  1:45 PM      Result Value Ref Range Status   Specimen Description BLOOD RIGHT ANTECUBITAL   Final   Special Requests BOTTLES DRAWN AEROBIC ONLY 8CC   Final   Culture  Setup Time     Final   Value: 04/20/2014 16:52     Performed at [facility]   Culture     Final   Value:        BLOOD CULTURE RECEIVED NO GROWTH TO DATE CULTURE WILL BE HELD FOR 5 DAYS BEFORE ISSUING A FINAL NEGATIVE REPORT     Performed at [facility]   Report Status PENDING   Incomplete  CULTURE, BLOOD (ROUTINE X 2)     Status: None   Collection Time    04/20/14  1:55 PM      Result Value Ref Range Status   Specimen Description BLOOD RIGHT HAND   Final   Special Requests BOTTLES DRAWN AEROBIC ONLY 6CC   Final   Culture  Setup Time     Final   Value: 04/20/2014 16:52     Performed at [facility]    Culture     Final   Value:        BLOOD CULTURE RECEIVED NO GROWTH TO DATE CULTURE WILL BE HELD FOR 5 DAYS BEFORE ISSUING A FINAL NEGATIVE REPORT     Performed at [facility]   Report Status PENDING   Incomplete    Medical History: Past Medical History  Diagnosis Date  . HTN (hypertension)     not well controlled  . 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 History of blood transfusion     "lots of times" (01/27/2013)  . CAP (community acquired pneumonia) 2010  . Type II diabetes mellitus   . Anemia   . Ovarian tumor, malignant 2005    resected in [place]' pt denies that this was cancer on 01/27/2013  . Diabetic retinopathy   . On home oxygen therapy     "5L; 24/7" (01/25/2014)  . ESRD on hemodialysis 08/25/2013    Started HD in 2008.  Had [place] transplant in 2010 at [facility].  Went back on HD in 2012.  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  The kidney was left in.  She now gets HD at [facility] on TTS schedule.     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  Both organs have since rejected, see "ESRD" for details.     Assessment: SOB, s/p urgent HD  AC: SQ heparin  Infectious Disease - Ancef #3. H/o miliary TB, Sepsis, MSSA in blood cx 7/13.. Afeb, WBC down 5.9. TTE negative for endocarditis. Plan TEE today 7/17.  Vancomycin 7/13> 7/16 Zosyn 7/13> 7/15 Ancef 7/15>> CHG/Bactroban 7/14>> (7/18)  7/15 blood x 2 - ngtd 7/13 Blood Cx x 2 - MSSA in both cx; sens Clina, emycin, Levaquin, Oxa, Septra, Vanc (MIC = 1)  Cardiovascular: HTN. VSS. BNP on admission > 70 K. TTE ok, no vegetations noted. EF 50-55%. On Norvasc5, labetalol, Zocor 20  Endocrinology: DM, A1c=6.1, CBGs 85-185. Lantus 50 qhs (63 pta), SSI  GI/Nutr: Regan qid - chg to ACHS; renal vit  Neuro: lexapro. Noted feeling better  Nephrology ESRD HD  TTS. HD done today. H/o failed kidney and pancreatic transplants. Phoslo, Hectoral  Pulmonary: 100% on 5L  Hematology / Oncology: Hgb 6.8>7.4, PLTC 171>261. Aranesp 200 mcg given on Tues 7/14  PTA Medication Issues: Clonidine stopped 7/15, other meds ordered  Best Practices: sq heparin   Goal of Therapy:  Treatment of MSSA bacteremia  Plan:  Ancef 2g IV TTS F/u results of TEE   Tahira Olivarez S. Alford Highland, PharmD, [facility] Clinical Staff Pharmacist Pager [place], [place] 04/22/2014,10:16 AM

## 2014-04-22 NOTE — Op Note (Signed)
Prelim:  TV normal  Mild TR AV mildly thickened.  No AI PV grossly normal MV normal  Mild TR NO vegetations seen. Normal LV function.  Full report to follow.

## 2014-04-22 NOTE — H&P (View-Only) (Signed)
[facility] Progress Note  Subjective:   No emerging complaints. TEE planned this afternoon  Objective Filed Vitals:   04/21/14 1722 04/21/14 2026 04/22/14 0442 04/22/14 0500  BP: 111/67 115/72 122/74   Pulse: 84 84 85   Temp: 98.7 F (37.1 C) 98.8 F (37.1 C) 97.6 F (36.4 C)   TempSrc: Oral Oral Oral   Resp: 18 18 18    Height:      Weight:  71.901 kg (158 lb 8.2 oz)  74.6 kg (164 lb 7.4 oz)  SpO2: 100% 100% 100%    Physical Exam General: Sleepy, but arouses easily. NAD Heart: RRR Lungs: CTA bilat, no wheezes/rhonchi Abdomen: soft, NT, +BS Extremities: No LE edema Dialysis Access: L AVF + bruit  HD: TTS [place]  4h 72kg 2/2.25 Bath 400/A1.5 LUA AVF Heparin 2400  Hect 1 ug TIW, Aranesp 200 mcg q Tues  Assessment/Plan: 1. Dyspnea/pulm edema- 2D echo- EF 50-55%, Net UF 6800 yesterday, got to edw, but now up 2+ kg. UF to edw tomorrow.   2. Staphylococcus aureus bacteremia- ID following, Vanc and cefazolin. Afebrile. Repeat BC negative to date. For TEE today. 3. ESRD - TTS,  K+ 4.8, HD tomorrow  4. Anemia - Hgb 7.4 - improved with volume off.  Continue Aranesp 200. Follow CBC  5. Secondary hyperparathyroidism - Ca+ 8.7 Cont Phoslo,and hectorol  6. HTN - SBPs 120s on norvasc and labetalol  7. Nutrition - NPO for procedure. Resume renal diet when appropriate, mulitvit  8. Hx failed kidney/panc tx  9. DM- per primary    Collene Leyden. Cletus Gash, PA-C [facility] Pager [phone] 04/22/2014,9:05 AM  LOS: 4 days   Pt seen, examined and agree w A/P as above.  Kelly Splinter MD pager [phone]    cell [phone] 04/22/2014, 11:47 AM     Additional Objective Labs: Basic Metabolic Panel:  Recent Labs Lab 04/18/14 1720 04/18/14 1940 04/19/14 0336 04/20/14 0050  NA 125*  --  129* 133*  K >7.7*  --  5.2 4.8  CL 82*  --  86* 91*  CO2 22  --  24 27  GLUCOSE 193*  --  176* 144*  BUN 52*  --  30* 20  CREATININE 8.36* 8.41* 5.81* 3.98*  CALCIUM  9.0  --  8.9 8.7   CBC:  Recent Labs Lab 04/18/14 1720 04/18/14 1940 04/20/14 0050 04/21/14 1332  WBC 11.7* 11.1* 7.7 5.9  HGB 7.5* 7.2* 6.8* 7.4*  HCT 22.5* 21.1* 20.9* 22.4*  MCV 93.0 91.3 95.4 90.7  PLT 238 239 171 261   Blood Culture    Component Value Date/Time   SDES BLOOD RIGHT HAND 04/20/2014 1355   SPECREQUEST BOTTLES DRAWN AEROBIC ONLY 6CC 04/20/2014 1355   CULT  Value:        BLOOD CULTURE RECEIVED NO GROWTH TO DATE CULTURE WILL BE HELD FOR 5 DAYS BEFORE ISSUING A FINAL NEGATIVE REPORT Performed at [place] 04/20/2014 1355   REPTSTATUS PENDING 04/20/2014 1355    Cardiac Enzymes:  Recent Labs Lab 04/18/14 1920 04/19/14 0114 04/19/14 0745  TROPONINI <0.30 <0.30 <0.30   CBG:  Recent Labs Lab 04/21/14 0740 04/21/14 1244 04/21/14 1721 04/21/14 2014 04/22/14 0836  GLUCAP 177* 115* 185* 170* 85   Studies/Results: No results found.  Medications: . sodium chloride     . amLODipine  5 mg Oral Daily  . calcium acetate  2,001 mg Oral TID WC  .  ceFAZolin (ANCEF) IV  2 g Intravenous  Q T,Th,Sa-HD  . Chlorhexidine Gluconate Cloth  6 each Topical Q0600  . darbepoetin (ARANESP) injection - DIALYSIS  200 mcg Intravenous Q Tue-HD  . doxercalciferol  1 mcg Intravenous Q T,Th,Sa-HD  . escitalopram  10 mg Oral Daily  . heparin  2,400 Units Intravenous Once  . heparin  5,000 Units Subcutaneous 3 times per day  . insulin aspart  0-5 Units Subcutaneous QHS  . insulin aspart  0-9 Units Subcutaneous TID WC  . insulin glargine  50 Units Subcutaneous QHS  . labetalol  100 mg Oral Daily  . metoCLOPramide  5 mg Oral QID  . multivitamin  1 tablet Oral QHS  . mupirocin ointment  1 application Nasal BID  . simvastatin  20 mg Oral Q M,W,F  . sodium chloride  3 mL Intravenous Q12H  . sodium chloride  3 mL Intravenous Q12H

## 2014-04-22 NOTE — H&P (View-Only) (Signed)
Patient ID: [name], female   DOB: April 19, 1976, 38 y.o.   MRN: [id number]         [facility] for Infectious Disease    Date of Admission:  04/18/2014   Total days of antibiotics 4         Principal Problem:   Staphylococcus aureus bacteremia Active Problems:   HYPERTENSION   Anemia   DM (diabetes mellitus)   ESRD on hemodialysis   Hyperkalemia   Hyponatremia   History of simultaneous kidney and pancreas transplant   . amLODipine  5 mg Oral Daily  . calcium acetate  2,001 mg Oral TID WC  .  ceFAZolin (ANCEF) IV  2 g Intravenous Q T,Th,Sa-HD  . Chlorhexidine Gluconate Cloth  6 each Topical Q0600  . darbepoetin (ARANESP) injection - DIALYSIS  200 mcg Intravenous Q Tue-HD  . doxercalciferol  1 mcg Intravenous Q T,Th,Sa-HD  . escitalopram  10 mg Oral Daily  . heparin  2,400 Units Intravenous Once  . heparin  5,000 Units Subcutaneous 3 times per day  . insulin aspart  0-5 Units Subcutaneous QHS  . insulin aspart  0-9 Units Subcutaneous TID WC  . insulin glargine  50 Units Subcutaneous QHS  . labetalol  100 mg Oral Daily  . metoCLOPramide  5 mg Oral QID  . multivitamin  1 tablet Oral QHS  . mupirocin ointment  1 application Nasal BID  . simvastatin  20 mg Oral Q M,W,F  . sodium chloride  3 mL Intravenous Q12H  . sodium chloride  3 mL Intravenous Q12H    Objective: Temp:  [97.7 F (36.5 C)-98.7 F (37.1 C)] 98.7 F (37.1 C) (07/16 1722) Pulse Rate:  [73-100] 84 (07/16 1722) Resp:  [18-20] 18 (07/16 1722) BP: (110-159)/(56-76) 111/67 mmHg (07/16 1722) SpO2:  [88 %-100 %] 100 % (07/16 1722) Weight:  [158 lb 8.2 oz (71.9 kg)-172 lb 9.9 oz (78.3 kg)] 158 lb 8.2 oz (71.9 kg) (07/16 1202)  Lab Results Lab Results  Component Value Date   WBC 5.9 04/21/2014   HGB 7.4* 04/21/2014   HCT 22.4* 04/21/2014   MCV 90.7 04/21/2014   PLT 261 04/21/2014    Lab Results  Component Value Date   CREATININE 3.98* 04/20/2014   BUN 20 04/20/2014   NA 133* 04/20/2014   K 4.8 04/20/2014     CL 91* 04/20/2014   CO2 27 04/20/2014      Microbiology: Recent Results (from the past 240 hour(s))  CULTURE, BLOOD (ROUTINE X 2)     Status: None   Collection Time    04/18/14  7:40 PM      Result Value Ref Range Status   Specimen Description BLOOD ARM RIGHT   Final   Special Requests BOTTLES DRAWN AEROBIC AND ANAEROBIC 5CC   Final   Culture  Setup Time     Final   Value: 04/19/2014 00:46     Performed at [facility]   Culture     Final   Value: STAPHYLOCOCCUS AUREUS     Note: RIFAMPIN AND GENTAMICIN SHOULD NOT BE USED AS SINGLE DRUGS FOR TREATMENT OF STAPH INFECTIONS.     Note: Gram Stain Report Called to,Read Back By and Verified With: TATA CARBONE@1116  ON [id number] BY [facility]     Performed at [facility]   Report Status 04/21/2014 FINAL   Final   Organism ID, Bacteria STAPHYLOCOCCUS AUREUS   Final  CULTURE, BLOOD (ROUTINE X 2)     Status:  None   Collection Time    04/18/14  7:50 PM      Result Value Ref Range Status   Specimen Description BLOOD HAND RIGHT   Final   Special Requests BOTTLES DRAWN AEROBIC ONLY 5CC   Final   Culture  Setup Time     Final   Value: 04/19/2014 00:46     Performed at [facility]   Culture     Final   Value: STAPHYLOCOCCUS AUREUS     Note: SUSCEPTIBILITIES PERFORMED ON PREVIOUS CULTURE WITHIN THE LAST 5 DAYS.     Note: Gram Stain Report Called to,Read Back By and Verified With: TATA CARBONE@1116  ON [id number] BY [facility]     Performed at [facility]   Report Status 04/21/2014 FINAL   Final  MRSA PCR SCREENING     Status: Abnormal   Collection Time    04/18/14 10:30 PM      Result Value Ref Range Status   MRSA by PCR POSITIVE (*) NEGATIVE Final   Comment:            The GeneXpert MRSA Assay (FDA     approved for NASAL specimens     only), is one component of a     comprehensive MRSA colonization     surveillance program. It is not     intended to diagnose MRSA     infection nor to guide or     monitor treatment  for     MRSA infections.     RESULT CALLED TO, READ BACK BY AND VERIFIED WITH:     PETTIFORD,A RN 0109 04/19/14 MITCHELL,L  CULTURE, BLOOD (ROUTINE X 2)     Status: None   Collection Time    04/20/14  1:45 PM      Result Value Ref Range Status   Specimen Description BLOOD RIGHT ANTECUBITAL   Final   Special Requests BOTTLES DRAWN AEROBIC ONLY 8CC   Final   Culture  Setup Time     Final   Value: 04/20/2014 16:52     Performed at [facility]   Culture     Final   Value:        BLOOD CULTURE RECEIVED NO GROWTH TO DATE CULTURE WILL BE HELD FOR 5 DAYS BEFORE ISSUING A FINAL NEGATIVE REPORT     Performed at [facility]   Report Status PENDING   Incomplete  CULTURE, BLOOD (ROUTINE X 2)     Status: None   Collection Time    04/20/14  1:55 PM      Result Value Ref Range Status   Specimen Description BLOOD RIGHT HAND   Final   Special Requests BOTTLES DRAWN AEROBIC ONLY 6CC   Final   Culture  Setup Time     Final   Value: 04/20/2014 16:52     Performed at [facility]   Culture     Final   Value:        BLOOD CULTURE RECEIVED NO GROWTH TO DATE CULTURE WILL BE HELD FOR 5 DAYS BEFORE ISSUING A FINAL NEGATIVE REPORT     Performed at [facility]   Report Status PENDING   Incomplete   Assessment: She has MSSA bacteremia. I will continue cefazolin and stop vancomycin now. Repeat blood cultures are negative to date. Transthoracic echocardiogram does not show any evidence of endocarditis. She will need a transesophageal echocardiogram to help establish duration of therapy and prognosis.  Plan: 1. Continue  cefazolin alone 2. Recommend TEE  Michel Bickers, MD [facility] for Infectious [place] [facility] [phone] pager   [phone] cell 04/21/2014, 5:38 PM

## 2014-04-22 NOTE — Progress Notes (Signed)
  Echocardiogram Echocardiogram Transesophageal has been performed.  Philipp Deputy 04/22/2014, 12:45 PM

## 2014-04-22 NOTE — Interval H&P Note (Deleted)
History and Physical Interval Note:  04/22/2014 12:13 PM  [name]  has presented today for surgery, with the diagnosis of bacterima  The various methods of treatment have been discussed with the patient and family. After consideration of risks, benefits and other options for treatment, the patient has consented to  Procedure(s) with comments: TRANSESOPHAGEAL ECHOCARDIOGRAM (TEE) (N/A) - [name] as a surgical intervention .  The patient's history has been reviewed, patient examined, no change in status, stable for surgery.  I have reviewed the patient's chart and labs.  Questions were answered to the patient's satisfaction.     [name]

## 2014-04-22 NOTE — Progress Notes (Signed)
[facility] Progress Note  Subjective:   No emerging complaints. TEE planned this afternoon  Objective Filed Vitals:   04/21/14 1722 04/21/14 2026 04/22/14 0442 04/22/14 0500  BP: 111/67 115/72 122/74   Pulse: 84 84 85   Temp: 98.7 F (37.1 C) 98.8 F (37.1 C) 97.6 F (36.4 C)   TempSrc: Oral Oral Oral   Resp: 18 18 18    Height:      Weight:  71.901 kg (158 lb 8.2 oz)  74.6 kg (164 lb 7.4 oz)  SpO2: 100% 100% 100%    Physical Exam General: Sleepy, but arouses easily. NAD Heart: RRR Lungs: CTA bilat, no wheezes/rhonchi Abdomen: soft, NT, +BS Extremities: No LE edema Dialysis Access: L AVF + bruit  HD: TTS [place]  4h 72kg 2/2.25 Bath 400/A1.5 LUA AVF Heparin 2400  Hect 1 ug TIW, Aranesp 200 mcg q Tues  Assessment/Plan: 1. Dyspnea/pulm edema- 2D echo- EF 50-55%, Net UF 6800 yesterday, got to edw, but now up 2+ kg. UF to edw tomorrow.   2. Staphylococcus aureus bacteremia- ID following, Vanc and cefazolin. Afebrile. Repeat BC negative to date. For TEE today. 3. ESRD - TTS,  K+ 4.8, HD tomorrow  4. Anemia - Hgb 7.4 - improved with volume off.  Continue Aranesp 200. Follow CBC  5. Secondary hyperparathyroidism - Ca+ 8.7 Cont Phoslo,and hectorol  6. HTN - SBPs 120s on norvasc and labetalol  7. Nutrition - NPO for procedure. Resume renal diet when appropriate, mulitvit  8. Hx failed kidney/panc tx  9. DM- per primary    Collene Leyden. Cletus Gash, PA-C [facility] Pager [phone] 04/22/2014,9:05 AM  LOS: 4 days   Pt seen, examined and agree w A/P as above.  Kelly Splinter MD pager [phone]    cell [phone] 04/22/2014, 11:47 AM     Additional Objective Labs: Basic Metabolic Panel:  Recent Labs Lab 04/18/14 1720 04/18/14 1940 04/19/14 0336 04/20/14 0050  NA 125*  --  129* 133*  K >7.7*  --  5.2 4.8  CL 82*  --  86* 91*  CO2 22  --  24 27  GLUCOSE 193*  --  176* 144*  BUN 52*  --  30* 20  CREATININE 8.36* 8.41* 5.81* 3.98*  CALCIUM  9.0  --  8.9 8.7   CBC:  Recent Labs Lab 04/18/14 1720 04/18/14 1940 04/20/14 0050 04/21/14 1332  WBC 11.7* 11.1* 7.7 5.9  HGB 7.5* 7.2* 6.8* 7.4*  HCT 22.5* 21.1* 20.9* 22.4*  MCV 93.0 91.3 95.4 90.7  PLT 238 239 171 261   Blood Culture    Component Value Date/Time   SDES BLOOD RIGHT HAND 04/20/2014 1355   SPECREQUEST BOTTLES DRAWN AEROBIC ONLY 6CC 04/20/2014 1355   CULT  Value:        BLOOD CULTURE RECEIVED NO GROWTH TO DATE CULTURE WILL BE HELD FOR 5 DAYS BEFORE ISSUING A FINAL NEGATIVE REPORT Performed at [place] 04/20/2014 1355   REPTSTATUS PENDING 04/20/2014 1355    Cardiac Enzymes:  Recent Labs Lab 04/18/14 1920 04/19/14 0114 04/19/14 0745  TROPONINI <0.30 <0.30 <0.30   CBG:  Recent Labs Lab 04/21/14 0740 04/21/14 1244 04/21/14 1721 04/21/14 2014 04/22/14 0836  GLUCAP 177* 115* 185* 170* 85   Studies/Results: No results found.  Medications: . sodium chloride     . amLODipine  5 mg Oral Daily  . calcium acetate  2,001 mg Oral TID WC  .  ceFAZolin (ANCEF) IV  2 g Intravenous  Q T,Th,Sa-HD  . Chlorhexidine Gluconate Cloth  6 each Topical Q0600  . darbepoetin (ARANESP) injection - DIALYSIS  200 mcg Intravenous Q Tue-HD  . doxercalciferol  1 mcg Intravenous Q T,Th,Sa-HD  . escitalopram  10 mg Oral Daily  . heparin  2,400 Units Intravenous Once  . heparin  5,000 Units Subcutaneous 3 times per day  . insulin aspart  0-5 Units Subcutaneous QHS  . insulin aspart  0-9 Units Subcutaneous TID WC  . insulin glargine  50 Units Subcutaneous QHS  . labetalol  100 mg Oral Daily  . metoCLOPramide  5 mg Oral QID  . multivitamin  1 tablet Oral QHS  . mupirocin ointment  1 application Nasal BID  . simvastatin  20 mg Oral Q M,W,F  . sodium chloride  3 mL Intravenous Q12H  . sodium chloride  3 mL Intravenous Q12H

## 2014-04-22 NOTE — Progress Notes (Signed)
    [facility] has been requested by Dr. Eliseo Squires to perform a transesophageal echocardiogram on Ms. Grieger for staph aureus bacteremia.  After review of history and examination, the risks and benefits of transesophageal echocardiogram have been explained including risks of esophageal damage, perforation (1:10,000 risk), bleeding, pharyngeal hematoma as well as other potential complications associated with conscious sedation including aspiration, arrhythmia, respiratory failure and death. Alternatives to treatment were discussed, questions were answered. Patient is willing to proceed. Scheduled for 1pm with Dr. Harrington Challenger today.  Melina Copa, PA-C 04/22/2014 8:23 AM

## 2014-04-22 NOTE — Care Management Note (Signed)
CARE MANAGEMENT NOTE 04/22/2014  Patient:  [name], [name]   Account Number:  [id number]  Date Initiated:  04/19/2014  Documentation initiated by:  MAYO,HENRIETTA  Subjective/Objective Assessment:   ESRD on HD TTS, dx hyperkalemia; lives with spouse and son, on home O2     Action/Plan:   Met with pt , not d/c needs identified at this time.   Anticipated DC Date:  04/24/2014   Anticipated DC Plan:  [place]  CM consult      Choice offered to / List presented to:             Status of service:   Medicare Important Message given?  YES (If response is "NO", the following Medicare IM given date fields will be blank) Date Medicare IM given:  04/22/2014 Medicare IM given by:  Lilliahna Schubring Date Additional Medicare IM given:   Additional Medicare IM given by:    Discharge Disposition:    Per UR Regulation:  Reviewed for med. necessity/level of care/duration of stay  If discussed at [place] of Stay Meetings, dates discussed:    Comments:

## 2014-04-22 NOTE — Progress Notes (Addendum)
PROGRESS NOTE  [name] XTG:[id number] DOB: 1976/07/19 DOA: 04/18/2014 PCP: Tommy Medal, MD  38 y.o. 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 The patient admitted May 26 through May 27 for volume overload in the setting of dialysis noncompliance.- approximately 4 L removed. Patient states that since hospital discharge, she has been compliant with hemodialysis.  Blood cultures came back positive x 2   Assessment/Plan: Hyperkalemia: resolved with dialysis  Hyponatremia- 125 upon initial presentation; improved after dialysis   Acute on chronic resp failure: Suspect likely secondary to mild volume overload on presentation now doubt HCAPNA -on 5L O2 at home  leukocytosis on presentation with fever- +bacteremia:staph aureus 2/2 -appreciate ID- cefazolin -TTE WNL -TEE 7/17 -? Source - repeat BC- NGTD  ESRD: HD per nephro  Insulin-dependent diabetes: Sliding scale insulin. Hemoglobin A1c.   Hypertension: resume home meds  Anemia: ARD.  No overt signs of bleeding. Continue to follow.     Code Status: full Family Communication: patient/called husband with number on chart but was sent to VM Disposition Plan: home 1-2 days   Consultants:  renal  Procedures:  HD   HPI/Subjective: Asking to go home All "joints" aching  Objective: Filed Vitals:   04/22/14 0442  BP: 122/74  Pulse: 85  Temp: 97.6 F (36.4 C)  Resp: 18    Intake/Output Summary (Last 24 hours) at 04/22/14 0805 Last data filed at 04/22/14 0448  Gross per 24 hour  Intake    240 ml  Output   6200 ml  Net  -5960 ml   Filed Weights   04/21/14 1202 04/21/14 2026 04/22/14 0500  Weight: 71.9 kg (158 lb 8.2 oz) 71.901 kg (158 lb 8.2 oz) 74.6 kg (164 lb 7.4 oz)    Exam:   General:  A+Ox3,  NAD  Cardiovascular: rrr  Respiratory: diminished  Abdomen: +BS, soft  Musculoskeletal: moves all 4 ext  Data Reviewed: Basic Metabolic Panel:  Recent Labs Lab 04/18/14 1720 04/18/14 1940 04/19/14 0336 04/20/14 0050  NA 125*  --  129* 133*  K >7.7*  --  5.2 4.8  CL 82*  --  86* 91*  CO2 22  --  24 27  GLUCOSE 193*  --  176* 144*  BUN 52*  --  30* 20  CREATININE 8.36* 8.41* 5.81* 3.98*  CALCIUM 9.0  --  8.9 8.7   Liver Function Tests: No results found for this basename: AST, ALT, ALKPHOS, BILITOT, PROT, ALBUMIN,  in the last 168 hours No results found for this basename: LIPASE, AMYLASE,  in the last 168 hours No results found for this basename: AMMONIA,  in the last 168 hours CBC:  Recent Labs Lab 04/18/14 1720 04/18/14 1940 04/20/14 0050 04/21/14 1332  WBC 11.7* 11.1* 7.7 5.9  HGB 7.5* 7.2* 6.8* 7.4*  HCT 22.5* 21.1* 20.9* 22.4*  MCV 93.0 91.3 95.4 90.7  PLT 238 239 171 261   Cardiac Enzymes:  Recent Labs Lab 04/18/14 1920 04/19/14 0114 04/19/14 0745  TROPONINI <0.30 <0.30 <0.30   BNP (last 3 results)  Recent Labs  04/18/14 1938  PROBNP >70000.0*   CBG:  Recent Labs Lab 04/21/14 0132 04/21/14 0740 04/21/14 1244 04/21/14 1721 04/21/14 2014  GLUCAP 99 177* 115* 185* 170*    Recent Results (from the past 240 hour(s))  CULTURE, BLOOD (ROUTINE X 2)  Status: None   Collection Time    04/18/14  7:40 PM      Result Value Ref Range Status   Specimen Description BLOOD ARM RIGHT   Final   Special Requests BOTTLES DRAWN AEROBIC AND ANAEROBIC 5CC   Final   Culture  Setup Time     Final   Value: 04/19/2014 00:46     Performed at [facility]   Culture     Final   Value: STAPHYLOCOCCUS AUREUS     Note: RIFAMPIN AND GENTAMICIN SHOULD NOT BE USED AS SINGLE DRUGS FOR TREATMENT OF STAPH INFECTIONS.     Note: Gram Stain Report Called to,Read Back By and Verified With: TATA CARBONE@1116  ON [id number] BY [facility]     Performed at [facility]   Report Status 04/21/2014 FINAL   Final   Organism ID, Bacteria STAPHYLOCOCCUS AUREUS   Final  CULTURE, BLOOD (ROUTINE X 2)     Status: None   Collection Time    04/18/14  7:50 PM      Result Value Ref Range Status   Specimen Description BLOOD HAND RIGHT   Final   Special Requests BOTTLES DRAWN AEROBIC ONLY 5CC   Final   Culture  Setup Time     Final   Value: 04/19/2014 00:46     Performed at [facility]   Culture     Final   Value: STAPHYLOCOCCUS AUREUS     Note: SUSCEPTIBILITIES PERFORMED ON PREVIOUS CULTURE WITHIN THE LAST 5 DAYS.     Note: Gram Stain Report Called to,Read Back By and Verified With: TATA CARBONE@1116  ON [id number] BY [facility]     Performed at [facility]   Report Status 04/21/2014 FINAL   Final  MRSA PCR SCREENING     Status: Abnormal   Collection Time    04/18/14 10:30 PM      Result Value Ref Range Status   MRSA by PCR POSITIVE (*) NEGATIVE Final   Comment:            The GeneXpert MRSA Assay (FDA     approved for NASAL specimens     only), is one component of a     comprehensive MRSA colonization     surveillance program. It is not     intended to diagnose MRSA     infection nor to guide or     monitor treatment for     MRSA infections.     RESULT CALLED TO, READ BACK BY AND VERIFIED WITH:     PETTIFORD,A RN 0109 04/19/14 MITCHELL,L  CULTURE, BLOOD (ROUTINE X 2)     Status: None   Collection Time    04/20/14  1:45 PM      Result Value Ref Range Status   Specimen Description BLOOD RIGHT ANTECUBITAL   Final   Special Requests BOTTLES DRAWN AEROBIC ONLY 8CC   Final   Culture  Setup Time     Final   Value: 04/20/2014 16:52     Performed at [facility]   Culture     Final   Value:        BLOOD CULTURE RECEIVED NO GROWTH TO DATE CULTURE WILL BE HELD FOR 5 DAYS BEFORE ISSUING A FINAL NEGATIVE REPORT     Performed at [facility]   Report Status PENDING   Incomplete  CULTURE, BLOOD (ROUTINE X 2)     Status: None    Collection Time  04/20/14  1:55 PM      Result Value Ref Range Status   Specimen Description BLOOD RIGHT HAND   Final   Special Requests BOTTLES DRAWN AEROBIC ONLY [facility]   Final   Culture  Setup Time     Final   Value: 04/20/2014 16:52     Performed at [facility]   Culture     Final   Value:        BLOOD CULTURE RECEIVED NO GROWTH TO DATE CULTURE WILL BE HELD FOR 5 DAYS BEFORE ISSUING A FINAL NEGATIVE REPORT     Performed at [facility]   Report Status PENDING   Incomplete     Studies: No results found.  Scheduled Meds: . amLODipine  5 mg Oral Daily  . calcium acetate  2,001 mg Oral TID WC  .  ceFAZolin (ANCEF) IV  2 g Intravenous Q T,Th,Sa-HD  . Chlorhexidine Gluconate Cloth  6 each Topical Q0600  . darbepoetin (ARANESP) injection - DIALYSIS  200 mcg Intravenous Q Tue-HD  . doxercalciferol  1 mcg Intravenous Q T,Th,Sa-HD  . escitalopram  10 mg Oral Daily  . heparin  2,400 Units Intravenous Once  . heparin  5,000 Units Subcutaneous 3 times per day  . insulin aspart  0-5 Units Subcutaneous QHS  . insulin aspart  0-9 Units Subcutaneous TID WC  . insulin glargine  50 Units Subcutaneous QHS  . labetalol  100 mg Oral Daily  . metoCLOPramide  5 mg Oral QID  . multivitamin  1 tablet Oral QHS  . mupirocin ointment  1 application Nasal BID  . simvastatin  20 mg Oral Q M,W,F  . sodium chloride  3 mL Intravenous Q12H  . sodium chloride  3 mL Intravenous Q12H   Continuous Infusions:  Antibiotics Given (last 72 hours)   Date/Time Action Medication Dose Rate   04/19/14 0920 Given   piperacillin-tazobactam (ZOSYN) IVPB 2.25 g 2.25 g 100 mL/hr   04/19/14 1705 Given   piperacillin-tazobactam (ZOSYN) IVPB 2.25 g 2.25 g 100 mL/hr   04/20/14 0103 Given   piperacillin-tazobactam (ZOSYN) IVPB 2.25 g 2.25 g 100 mL/hr   04/20/14 1218 Given   ceFAZolin (ANCEF) IVPB 1 g/50 mL premix 1 g 100 mL/hr   04/21/14 1051 Given   ceFAZolin (ANCEF) IVPB 2 g/50 mL premix 2 g 100 mL/hr       Principal Problem:   Staphylococcus aureus bacteremia Active Problems:   HYPERTENSION   Anemia   DM (diabetes mellitus)   ESRD on hemodialysis   Hyperkalemia   Hyponatremia   History of simultaneous kidney and pancreas transplant    Time spent: 25 min    Phylis Javed, [facility] Pager [phone]. If 7PM-7AM, please contact night-coverage at [email or web address], password [facility] 04/22/2014, 8:05 AM  LOS: 4 days

## 2014-04-22 NOTE — Interval H&P Note (Signed)
History and Physical Interval Note:  04/22/2014 10:20 AM  [name]  has presented today for surgery, with the diagnosis of bacterima  The various methods of treatment have been discussed with the patient and family. After consideration of risks, benefits and other options for treatment, the patient has consented to  Procedure(s) with comments: TRANSESOPHAGEAL ECHOCARDIOGRAM (TEE) (N/A) - [name] as a surgical intervention .  The patient's history has been reviewed, patient examined, no change in status, stable for surgery.  I have reviewed the patient's chart and labs.  Questions were answered to the patient's satisfaction.     [name]

## 2014-04-22 NOTE — Progress Notes (Signed)
Patient ID: [name], female   DOB: 12-Jan-1976, 38 y.o.   MRN: [id number]         [facility] for Infectious Disease    Date of Admission:  04/18/2014   Total days of antibiotics 6         Principal Problem:   Staphylococcus aureus bacteremia Active Problems:   HYPERTENSION   Anemia   DM (diabetes mellitus)   ESRD on hemodialysis   Hyperkalemia   Hyponatremia   History of simultaneous kidney and pancreas transplant   . amLODipine  5 mg Oral Daily  . calcium acetate  2,001 mg Oral TID WC  .  ceFAZolin (ANCEF) IV  2 g Intravenous Q T,Th,Sa-HD  . Chlorhexidine Gluconate Cloth  6 each Topical Q0600  . darbepoetin (ARANESP) injection - DIALYSIS  200 mcg Intravenous Q Tue-HD  . doxercalciferol  1 mcg Intravenous Q T,Th,Sa-HD  . escitalopram  10 mg Oral Daily  . heparin  2,400 Units Intravenous Once  . heparin  5,000 Units Subcutaneous 3 times per day  . insulin aspart  0-5 Units Subcutaneous QHS  . insulin aspart  0-9 Units Subcutaneous TID WC  . insulin glargine  50 Units Subcutaneous QHS  . labetalol  100 mg Oral Daily  . metoCLOPramide  5 mg Oral QID  . multivitamin  1 tablet Oral QHS  . mupirocin ointment  1 application Nasal BID  . simvastatin  20 mg Oral Q M,W,F  . sodium chloride  3 mL Intravenous Q12H  . sodium chloride  3 mL Intravenous Q12H    Subjective: She is feeling better.   Objective: Temp:  [97.6 F (36.4 C)-98.8 F (37.1 C)] 98.6 F (37 C) (07/17 1239) Pulse Rate:  [76-85] 77 (07/17 1245) Resp:  [11-27] 25 (07/17 1245) BP: (111-148)/(53-76) 132/59 mmHg (07/17 1245) SpO2:  [96 %-100 %] 96 % (07/17 1245) Weight:  [71.901 kg (158 lb 8.2 oz)-74.6 kg (164 lb 7.4 oz)] 74.6 kg (164 lb 7.4 oz) (07/17 0500)  General: Alert comfortable Skin: No rash Lungs: Clear Cor: Regular S1 and S2 no murmurs Abdomen: Nontender Left upper arm fistula: no sign of infection  Lab Results Lab Results  Component Value Date   WBC 5.9 04/21/2014   HGB 7.4*  04/21/2014   HCT 22.4* 04/21/2014   MCV 90.7 04/21/2014   PLT 261 04/21/2014    Lab Results  Component Value Date   CREATININE 3.98* 04/20/2014   BUN 20 04/20/2014   NA 133* 04/20/2014   K 4.8 04/20/2014   CL 91* 04/20/2014   CO2 27 04/20/2014    Lab Results  Component Value Date   ALT 14 03/01/2014   AST 14 03/01/2014   ALKPHOS 64 03/01/2014   BILITOT 0.6 03/01/2014      Microbiology: Recent Results (from the past 240 hour(s))  CULTURE, BLOOD (ROUTINE X 2)     Status: None   Collection Time    04/18/14  7:40 PM      Result Value Ref Range Status   Specimen Description BLOOD ARM RIGHT   Final   Special Requests BOTTLES DRAWN AEROBIC AND ANAEROBIC 5CC   Final   Culture  Setup Time     Final   Value: 04/19/2014 00:46     Performed at [facility]   Culture     Final   Value: STAPHYLOCOCCUS AUREUS     Note: RIFAMPIN AND GENTAMICIN SHOULD NOT BE USED AS SINGLE DRUGS FOR TREATMENT OF STAPH  INFECTIONS.     Note: Gram Stain Report Called to,Read Back By and Verified With: TATA CARBONE@1116  ON [id number] BY [facility]     Performed at [facility]   Report Status 04/21/2014 FINAL   Final   Organism ID, Bacteria STAPHYLOCOCCUS AUREUS   Final  CULTURE, BLOOD (ROUTINE X 2)     Status: None   Collection Time    04/18/14  7:50 PM      Result Value Ref Range Status   Specimen Description BLOOD HAND RIGHT   Final   Special Requests BOTTLES DRAWN AEROBIC ONLY 5CC   Final   Culture  Setup Time     Final   Value: 04/19/2014 00:46     Performed at [facility]   Culture     Final   Value: STAPHYLOCOCCUS AUREUS     Note: SUSCEPTIBILITIES PERFORMED ON PREVIOUS CULTURE WITHIN THE LAST 5 DAYS.     Note: Gram Stain Report Called to,Read Back By and Verified With: TATA CARBONE@1116  ON [id number] BY [facility]     Performed at [facility]   Report Status 04/21/2014 FINAL   Final  MRSA PCR SCREENING     Status: Abnormal   Collection Time    04/18/14 10:30 PM      Result Value Ref  Range Status   MRSA by PCR POSITIVE (*) NEGATIVE Final   Comment:            The GeneXpert MRSA Assay (FDA     approved for NASAL specimens     only), is one component of a     comprehensive MRSA colonization     surveillance program. It is not     intended to diagnose MRSA     infection nor to guide or     monitor treatment for     MRSA infections.     RESULT CALLED TO, READ BACK BY AND VERIFIED WITH:     PETTIFORD,A RN 0109 04/19/14 MITCHELL,L  CULTURE, BLOOD (ROUTINE X 2)     Status: None   Collection Time    04/20/14  1:45 PM      Result Value Ref Range Status   Specimen Description BLOOD RIGHT ANTECUBITAL   Final   Special Requests BOTTLES DRAWN AEROBIC ONLY 8CC   Final   Culture  Setup Time     Final   Value: 04/20/2014 16:52     Performed at [facility]   Culture     Final   Value:        BLOOD CULTURE RECEIVED NO GROWTH TO DATE CULTURE WILL BE HELD FOR 5 DAYS BEFORE ISSUING A FINAL NEGATIVE REPORT     Performed at [facility]   Report Status PENDING   Incomplete  CULTURE, BLOOD (ROUTINE X 2)     Status: None   Collection Time    04/20/14  1:55 PM      Result Value Ref Range Status   Specimen Description BLOOD RIGHT HAND   Final   Special Requests BOTTLES DRAWN AEROBIC ONLY 6CC   Final   Culture  Setup Time     Final   Value: 04/20/2014 16:52     Performed at [facility]   Culture     Final   Value:        BLOOD CULTURE RECEIVED NO GROWTH TO DATE CULTURE WILL BE HELD FOR 5 DAYS BEFORE ISSUING A FINAL NEGATIVE REPORT     Performed  at [facility]   Report Status PENDING   Incomplete   TEE 04/22/2014: No evidence of endocarditis noted  Assessment: Her staph aureus bacteremia appears to be responding to antibiotic therapy. Repeat blood cultures are negative and she has no evidence of endocarditis or other deep focus of infection. Given the presence of her fistula she is at risk for endovascular infection but it does not appear to be  infected at this time. I would recommend 3 weeks of total antibiotic therapy with cefazolin dose after hemodialysis.  Plan: 1. Continue cefazolin through August 2 2. I will sign off now please call if I can be of further assistance  Michel Bickers, MD [facility] for [place] [phone] pager   [phone] cell 04/22/2014, 4:52 PM

## 2014-04-23 DIAGNOSIS — N039 Chronic nephritic syndrome with unspecified morphologic changes: Secondary | ICD-10-CM

## 2014-04-23 DIAGNOSIS — D631 Anemia in chronic kidney disease: Secondary | ICD-10-CM

## 2014-04-23 LAB — CBC
HCT: 17.4 % — ABNORMAL LOW (ref 36.0–46.0)
Hemoglobin: 6.2 g/dL — CL (ref 12.0–15.0)
MCH: 30.8 pg (ref 26.0–34.0)
MCHC: 35.6 g/dL (ref 30.0–36.0)
MCV: 86.6 fL (ref 78.0–100.0)
PLATELETS: 279 10*3/uL (ref 150–400)
RBC: 2.01 MIL/uL — ABNORMAL LOW (ref 3.87–5.11)
RDW: 15.2 % (ref 11.5–15.5)
WBC: 7.2 10*3/uL (ref 4.0–10.5)

## 2014-04-23 LAB — GLUCOSE, CAPILLARY
GLUCOSE-CAPILLARY: 92 mg/dL (ref 70–99)
Glucose-Capillary: 97 mg/dL (ref 70–99)
Glucose-Capillary: 77 mg/dL (ref 70–99)

## 2014-04-23 LAB — RENAL FUNCTION PANEL
ALBUMIN: 2.9 g/dL — AB (ref 3.5–5.2)
Anion gap: 20 — ABNORMAL HIGH (ref 5–15)
BUN: 59 mg/dL — ABNORMAL HIGH (ref 6–23)
CALCIUM: 8.9 mg/dL (ref 8.4–10.5)
CO2: 22 meq/L (ref 19–32)
CREATININE: 7.04 mg/dL — AB (ref 0.50–1.10)
Chloride: 80 mEq/L — ABNORMAL LOW (ref 96–112)
GFR calc Af Amer: 8 mL/min — ABNORMAL LOW (ref >90)
GFR, EST NON AFRICAN AMERICAN: 7 mL/min — AB (ref >90)
Glucose, Bld: 89 mg/dL (ref 70–99)
PHOSPHORUS: 6.7 mg/dL — AB (ref 2.3–4.6)
Potassium: 4.9 mEq/L (ref 3.7–5.3)
SODIUM: 122 meq/L — AB (ref 137–147)

## 2014-04-23 LAB — PREPARE RBC (CROSSMATCH)

## 2014-04-23 MED ORDER — INSULIN GLARGINE 100 UNIT/ML ~~LOC~~ SOLN: 50.0000 [IU] | Freq: Every day | SUBCUTANEOUS | Status: AC

## 2014-04-23 MED ORDER — CEFAZOLIN SODIUM-DEXTROSE 2-3 GM-% IV SOLR: 2.0000 g | INTRAVENOUS | Status: DC

## 2014-04-23 NOTE — Discharge Summary (Signed)
Physician Discharge Summary  [name] CZY:[id number] DOB: [birth year] DOA: 04/18/2014  PCP: Tommy Medal, MD  Admit date: 04/18/2014 Discharge date: 04/23/2014  Time spent: greater than 30 minutes  Discharge Diagnoses:  Principal Problem:   Staphylococcus aureus bacteremia Active Problems:   HYPERTENSION   Anemia of renal disease   DM (diabetes mellitus)   ESRD on hemodialysis   Hyperkalemia   Hyponatremia   History of simultaneous kidney and pancreas transplant   Discharge Condition: stable  Filed Weights   04/22/14 0500 04/22/14 2041 04/23/14 1215  Weight: 74.6 kg (164 lb 7.4 oz) 74.6 kg (164 lb 7.4 oz) 78.6 kg (173 lb 4.5 oz)    History of present illness:  38 y.o. 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 The patient notes abdomen dated May 26 through May 27 for volume overload in the setting of dialysis noncompliance. At approximately 4 L removed. Patient states that since hospital discharge, she has been compliant with hemodialysis. Received dialysis last week Thursday as well Saturday. Over the weekend patient became progressively weak and short of breath. Denies any chest pain, nausea, vomiting. No diarrhea. Has had some mild chills at home. Denies taking any new medicines. Reports she has been compliant with her medication regimen. Denies any recent strenuous activity. Blood sugars have been stable. Came to the ER today because of worsening weakness.  On presentation, MAXIMUM TEMPERATURE 99.3, blood pressure in the 093A to 355D systolic. Satting greater than 95% on 5 L. White blood cell count 11.7, hemoglobin 7.5, sodium 125, potassium 7.7, creatinine 8.36, BUN 52, bicarb 22, glucose 193. Chest x-ray shows enlarged cardiac silhouette and pulmonary edema mildly improved compared to previous  x-rays. Trop neg x1. EKG shows peaked T waves. Patient given IV calcium gluconate as well as insulin.   Hospital Course:  Admitted to telemetry. Nephrology consulted for urgent dialysis  Hyperkalemia: resolved with dialysis  Hyponatremia- 125 upon initial presentation; improved after dialysis  Acute on chronic resp failure: Suspect likely secondary to mild volume overload on presentation. Improved with dialysis -on 5L O2 at home   leukocytosis on presentation with fever- +bacteremia:staph aureus 2/2  ID consulted. TTE showed no vegetation. TEE showed no vegetation.  repeat BC- NGTD. ID recommends ancef after dialysis through 8/2, then stop  ESRD: HD per nephrology  Anemia, chronic. No bleeding. Nephrology ordered 2 units pRBC transfusion during dialysis day of discharge for hgb of 6.2  Consultants:  Renal ID cardiology Procedures:  HD TEE  Discharge Exam: Filed Vitals:   04/23/14 1243  BP: 143/75  Pulse: 73  Temp:   Resp:     General: on dialysis Cardiovascular: RRR Respiratory: CTA  Discharge Instructions   Activity as tolerated - No restrictions    Complete by:  As directed      Discharge instructions    Complete by:  As directed   Renal carbohydrate modified diet            Medication List         acetaminophen 500 MG tablet  Commonly known as:  TYLENOL  Take 1,000 mg by mouth every 6 (six) hours as needed for moderate pain.     amLODipine 10 MG tablet  Commonly known as:  NORVASC  Take 10 mg by mouth at bedtime.     aspirin EC 81  MG tablet  Take 81 mg by mouth at bedtime.     calcium acetate 667 MG capsule  Commonly known as:  PHOSLO  Take 3 capsules (2,001 mg total) by mouth 3 (three) times daily with meals.     ceFAZolin 2-3 GM-% Solr  Commonly known as:  ANCEF  Inject 50 mLs (2 g total) into the vein Every Tuesday,Thursday,and Saturday with dialysis. Through 8/2, then stop     cloNIDine 0.1 MG tablet  Commonly known as:  CATAPRES  Take  0.1 mg by mouth daily.     diphenhydrAMINE 25 mg capsule  Commonly known as:  BENADRYL  Take 25 mg by mouth daily as needed for allergies.     escitalopram 20 MG tablet  Commonly known as:  LEXAPRO  Take 10 mg by mouth daily.     insulin aspart 100 UNIT/ML injection  Commonly known as:  novoLOG  Inject 12 Units into the skin 2 (two) times daily with breakfast and lunch.     insulin glargine 100 UNIT/ML injection  Commonly known as:  LANTUS  Inject 0.5 mLs (50 Units total) into the skin at bedtime.     labetalol 300 MG tablet  Commonly known as:  NORMODYNE  Take 300 mg by mouth daily.     metoCLOPramide 5 MG tablet  Commonly known as:  REGLAN  Take 5 mg by mouth 4 (four) times daily.     simvastatin 20 MG tablet  Commonly known as:  ZOCOR  Take 20 mg by mouth every Monday, Wednesday, and Friday.       Allergies  Allergen Reactions  . Ace Inhibitors Cough       Follow-up Information   Follow up with Tommy Medal, MD. (As needed)    Specialty:  Internal Medicine   Contact information:   [address] [place] [address] [phone]        The results of significant diagnostics from this hospitalization (including imaging, microbiology, ancillary and laboratory) are listed below for reference.    Significant Diagnostic Studies: Dg Chest Portable 1 View  04/18/2014   CLINICAL DATA:  Shortness of breath.  Pain in chest.  EXAM: PORTABLE CHEST - 1 VIEW  COMPARISON:  02/28/2014  FINDINGS: Enlarged cardiopericardial silhouette noted with indistinct pulmonary vasculature. Mild interstitial accentuation is noted but this is improved compared to prior.  Mildly low lung volumes.  No definite pleural effusion.  IMPRESSION: 1. Mild to moderate enlargement of the cardiopericardial silhouette with mild interstitial edema, improved from prior.   Electronically Signed   By: Sherryl Barters M.D.   On: 04/18/2014 18:04   Echo Left ventricle: The cavity size was  mildly dilated. Wall thickness was normal. Systolic function was normal. The estimated ejection fraction was in the range of 50% to 55%. Wall motion was normal; there were no regional wall motion abnormalities. - Left atrium: The atrium was moderately to severely dilated.  Microbiology: Recent Results (from the past 240 hour(s))  CULTURE, BLOOD (ROUTINE X 2)     Status: None   Collection Time    04/18/14  7:40 PM      Result Value Ref Range Status   Specimen Description BLOOD ARM RIGHT   Final   Special Requests BOTTLES DRAWN AEROBIC AND ANAEROBIC 5CC   Final   Culture  Setup Time     Final   Value: 04/19/2014 00:46     Performed at [facility]  Final   Value: STAPHYLOCOCCUS AUREUS     Note: RIFAMPIN AND GENTAMICIN SHOULD NOT BE USED AS SINGLE DRUGS FOR TREATMENT OF STAPH INFECTIONS.     Note: Gram Stain Report Called to,Read Back By and Verified With: TATA CARBONE@1116  ON [id number] BY [facility]     Performed at [facility]   Report Status 04/21/2014 FINAL   Final   Organism ID, Bacteria STAPHYLOCOCCUS AUREUS   Final  CULTURE, BLOOD (ROUTINE X 2)     Status: None   Collection Time    04/18/14  7:50 PM      Result Value Ref Range Status   Specimen Description BLOOD HAND RIGHT   Final   Special Requests BOTTLES DRAWN AEROBIC ONLY 5CC   Final   Culture  Setup Time     Final   Value: 04/19/2014 00:46     Performed at [facility]   Culture     Final   Value: STAPHYLOCOCCUS AUREUS     Note: SUSCEPTIBILITIES PERFORMED ON PREVIOUS CULTURE WITHIN THE LAST 5 DAYS.     Note: Gram Stain Report Called to,Read Back By and Verified With: TATA CARBONE@1116  ON [id number] BY [facility]     Performed at [facility]   Report Status 04/21/2014 FINAL   Final  MRSA PCR SCREENING     Status: Abnormal   Collection Time    04/18/14 10:30 PM      Result Value Ref Range Status   MRSA by PCR POSITIVE (*) NEGATIVE Final   Comment:            The GeneXpert MRSA Assay  (FDA     approved for NASAL specimens     only), is one component of a     comprehensive MRSA colonization     surveillance program. It is not     intended to diagnose MRSA     infection nor to guide or     monitor treatment for     MRSA infections.     RESULT CALLED TO, READ BACK BY AND VERIFIED WITH:     PETTIFORD,A RN [id number] 04/19/14 MITCHELL,L  CULTURE, BLOOD (ROUTINE X 2)     Status: None   Collection Time    04/20/14  1:45 PM      Result Value Ref Range Status   Specimen Description BLOOD RIGHT ANTECUBITAL   Final   Special Requests BOTTLES DRAWN AEROBIC ONLY 8CC   Final   Culture  Setup Time     Final   Value: 04/20/2014 16:52     Performed at [facility]   Culture     Final   Value:        BLOOD CULTURE RECEIVED NO GROWTH TO DATE CULTURE WILL BE HELD FOR 5 DAYS BEFORE ISSUING A FINAL NEGATIVE REPORT     Performed at [facility]   Report Status PENDING   Incomplete  CULTURE, BLOOD (ROUTINE X 2)     Status: None   Collection Time    04/20/14  1:55 PM      Result Value Ref Range Status   Specimen Description BLOOD RIGHT HAND   Final   Special Requests BOTTLES DRAWN AEROBIC ONLY [facility]   Final   Culture  Setup Time     Final   Value: 04/20/2014 16:52     Performed at [facility]   Culture     Final   Value:  BLOOD CULTURE RECEIVED NO GROWTH TO DATE CULTURE WILL BE HELD FOR 5 DAYS BEFORE ISSUING A FINAL NEGATIVE REPORT     Performed at [facility]   Report Status PENDING   Incomplete     Labs: Basic Metabolic Panel:  Recent Labs Lab 04/18/14 1720 04/18/14 1940 04/19/14 0336 04/20/14 0050  NA 125*  --  129* 133*  K >7.7*  --  5.2 4.8  CL 82*  --  86* 91*  CO2 22  --  24 27  GLUCOSE 193*  --  176* 144*  BUN 52*  --  30* 20  CREATININE 8.36* 8.41* 5.81* 3.98*  CALCIUM 9.0  --  8.9 8.7   Liver Function Tests: No results found for this basename: AST, ALT, ALKPHOS, BILITOT, PROT, ALBUMIN,  in the last 168 hours No results  found for this basename: LIPASE, AMYLASE,  in the last 168 hours No results found for this basename: AMMONIA,  in the last 168 hours CBC:  Recent Labs Lab 04/18/14 1720 04/18/14 1940 04/20/14 0050 04/21/14 1332 04/23/14 1241  WBC 11.7* 11.1* 7.7 5.9 7.2  HGB 7.5* 7.2* 6.8* 7.4* 6.2*  HCT 22.5* 21.1* 20.9* 22.4* 17.4*  MCV 93.0 91.3 95.4 90.7 86.6  PLT 238 239 171 261 279   Cardiac Enzymes:  Recent Labs Lab 04/18/14 1920 04/19/14 0114 04/19/14 0745  TROPONINI <0.30 <0.30 <0.30   BNP: BNP (last 3 results)  Recent Labs  04/18/14 1938  PROBNP >70000.0*   CBG:  Recent Labs Lab 04/22/14 1332 04/22/14 1647 04/22/14 2038 04/23/14 0733 04/23/14 1215  GLUCAP 126* 130* 178* 92 97       Signed:  Makayla Lanter L  [facility] 04/23/2014, 1:25 PM

## 2014-04-23 NOTE — Progress Notes (Signed)
[facility] Progress Note  Subjective:   Laying in bed. Complains of body aches but anxious for discharge. Stressed importance of getting up out of bed/moving around to avoid deconditioning.  Objective Filed Vitals:   04/22/14 1744 04/22/14 2041 04/23/14 0532 04/23/14 1005  BP: 167/71 171/75 171/74 160/67  Pulse: 90 86 86 83  Temp: 98.2 F (36.8 C) 98.4 F (36.9 C) 97.6 F (36.4 C) 97.6 F (36.4 C)  TempSrc: Oral Oral Oral Oral  Resp: 20 20 20 19   Height:      Weight:  74.6 kg (164 lb 7.4 oz)    SpO2: 97% 100% 100% 100%   Physical Exam General: Alert, cooperative, NAD Heart: RRR Lungs: CTA bilat, no wheezes/crackles Abdomen: Soft, NT, + BS Extremities: Trace LE edema bilat Dialysis Access: L AVG + bruit. No overt signs of infection  HD: TTS [place]  4h 72kg 2/2.25 Bath 400/A1.5 LUA AVF Heparin 2400  Hect 1 ug TIW, Aranesp 200 mcg q Tues   Assessment/Plan: 1. Dyspnea/pulm edema- 2D echo- EF 50-55%. Got to edw on Thurs. Fluid restriction somewhat better in controlled environment. UF to EDW today  2. Staphylococcus aureus bacteremia-  Afebrile. Repeat BC negative to date. TEE on 7/17 negative for vegetations. ID has signed off - recommends Cefazolin with HD through 05/08/14 3. ESRD - TTS, K+ 4.8, HD today 4. Anemia - Hgb 7.4 - improved with volume off. Continue Aranesp 200. CBC pending 5. Secondary hyperparathyroidism - Ca+ 8.7 Cont Phoslo,and hectorol. Renal panel pending  6. HTN - SBPs 160s-170s on norvasc and labetalol.  7. Nutrition - Renal/ carb mod diet, mulitvit  8. Hx failed kidney/panc tx  9. DM - per primary  10. Dispo - ? Soon.    Collene Leyden. Cletus Gash, PA-C [facility] Pager [phone] 04/23/2014,11:14 AM  LOS: 5 days   Pt seen, examined and agree w A/P as above. Ready for d/c , will arrange for her abx to be given at OP HD.  Kelly Splinter MD pager [phone]    cell [phone] 04/23/2014, 6:27 PM     Additional  Objective Labs: Basic Metabolic Panel:  Recent Labs Lab 04/18/14 1720 04/18/14 1940 04/19/14 0336 04/20/14 0050  NA 125*  --  129* 133*  K >7.7*  --  5.2 4.8  CL 82*  --  86* 91*  CO2 22  --  24 27  GLUCOSE 193*  --  176* 144*  BUN 52*  --  30* 20  CREATININE 8.36* 8.41* 5.81* 3.98*  CALCIUM 9.0  --  8.9 8.7   CBC:  Recent Labs Lab 04/18/14 1720 04/18/14 1940 04/20/14 0050 04/21/14 1332  WBC 11.7* 11.1* 7.7 5.9  HGB 7.5* 7.2* 6.8* 7.4*  HCT 22.5* 21.1* 20.9* 22.4*  MCV 93.0 91.3 95.4 90.7  PLT 238 239 171 261   Blood Culture    Component Value Date/Time   SDES BLOOD RIGHT HAND 04/20/2014 1355   SPECREQUEST BOTTLES DRAWN AEROBIC ONLY 6CC 04/20/2014 1355   CULT  Value:        BLOOD CULTURE RECEIVED NO GROWTH TO DATE CULTURE WILL BE HELD FOR 5 DAYS BEFORE ISSUING A FINAL NEGATIVE REPORT Performed at [facility] 04/20/2014 1355   REPTSTATUS PENDING 04/20/2014 1355    Cardiac Enzymes:  Recent Labs Lab 04/18/14 1920 04/19/14 0114 04/19/14 0745  TROPONINI <0.30 <0.30 <0.30   CBG:  Recent Labs Lab 04/22/14 0836 04/22/14 1332 04/22/14 1647 04/22/14 2038 04/23/14 0733  GLUCAP 85  126* 130* 178* 92   Studies/Results: No results found. Medications:   . amLODipine  5 mg Oral Daily  . calcium acetate  2,001 mg Oral TID WC  .  ceFAZolin (ANCEF) IV  2 g Intravenous Q T,Th,Sa-HD  . darbepoetin (ARANESP) injection - DIALYSIS  200 mcg Intravenous Q Tue-HD  . doxercalciferol  1 mcg Intravenous Q T,Th,Sa-HD  . escitalopram  10 mg Oral Daily  . heparin  2,400 Units Intravenous Once  . heparin  5,000 Units Subcutaneous 3 times per day  . insulin aspart  0-5 Units Subcutaneous QHS  . insulin aspart  0-9 Units Subcutaneous TID WC  . insulin glargine  50 Units Subcutaneous QHS  . labetalol  100 mg Oral Daily  . metoCLOPramide  5 mg Oral QID  . multivitamin  1 tablet Oral QHS  . mupirocin ointment  1 application Nasal BID  . simvastatin  20 mg Oral Q M,W,F   . sodium chloride  3 mL Intravenous Q12H  . sodium chloride  3 mL Intravenous Q12H

## 2014-04-23 NOTE — Progress Notes (Signed)
Discharge education completed by RN. Pt and spouse received a copy of discharge paperwork and confirm understanding of follow up appointments and discharge medications. Both deny any questions at this time. IV removed, site is within normal limits. Pt will discharge from the unit via wheelchair. 

## 2014-04-24 LAB — TYPE AND SCREEN
ABO/RH(D): AB POS
Antibody Screen: NEGATIVE
Unit division: 0
Unit division: 0

## 2014-04-25 ENCOUNTER — Encounter (HOSPITAL_COMMUNITY): Payer: Self-pay | Admitting: Internal Medicine

## 2014-04-26 LAB — CULTURE, BLOOD (ROUTINE X 2)
CULTURE: NO GROWTH
Culture: NO GROWTH

## 2014-09-22 ENCOUNTER — Other Ambulatory Visit: Payer: Self-pay | Admitting: Nurse Practitioner

## 2014-10-31 ENCOUNTER — Inpatient Hospital Stay (HOSPITAL_COMMUNITY): Payer: Medicare Other

## 2014-10-31 ENCOUNTER — Inpatient Hospital Stay (HOSPITAL_COMMUNITY)
Admission: EM | Admit: 2014-10-31 | Discharge: 2014-11-16 | DRG: 640 | Discharge status: Against Medical Advice | Payer: Medicare Other | Attending: Internal Medicine | Admitting: Internal Medicine

## 2014-10-31 ENCOUNTER — Emergency Department (HOSPITAL_COMMUNITY): Payer: Medicare Other

## 2014-10-31 ENCOUNTER — Encounter (HOSPITAL_COMMUNITY): Payer: Self-pay

## 2014-10-31 DIAGNOSIS — Z9483 Pancreas transplant status: Secondary | ICD-10-CM

## 2014-10-31 DIAGNOSIS — I120 Hypertensive chronic kidney disease with stage 5 chronic kidney disease or end stage renal disease: Secondary | ICD-10-CM | POA: Diagnosis present

## 2014-10-31 DIAGNOSIS — I10 Essential (primary) hypertension: Secondary | ICD-10-CM | POA: Diagnosis not present

## 2014-10-31 DIAGNOSIS — E1165 Type 2 diabetes mellitus with hyperglycemia: Secondary | ICD-10-CM | POA: Diagnosis present

## 2014-10-31 DIAGNOSIS — J189 Pneumonia, unspecified organism: Secondary | ICD-10-CM

## 2014-10-31 DIAGNOSIS — R402212 Coma scale, best verbal response, none, at arrival to emergency department: Secondary | ICD-10-CM | POA: Diagnosis present

## 2014-10-31 DIAGNOSIS — I619 Nontraumatic intracerebral hemorrhage, unspecified: Secondary | ICD-10-CM

## 2014-10-31 DIAGNOSIS — Z23 Encounter for immunization: Secondary | ICD-10-CM

## 2014-10-31 DIAGNOSIS — R0602 Shortness of breath: Secondary | ICD-10-CM

## 2014-10-31 DIAGNOSIS — I468 Cardiac arrest due to other underlying condition: Secondary | ICD-10-CM | POA: Diagnosis present

## 2014-10-31 DIAGNOSIS — J9601 Acute respiratory failure with hypoxia: Secondary | ICD-10-CM | POA: Diagnosis present

## 2014-10-31 DIAGNOSIS — N2581 Secondary hyperparathyroidism of renal origin: Secondary | ICD-10-CM | POA: Diagnosis present

## 2014-10-31 DIAGNOSIS — R570 Cardiogenic shock: Secondary | ICD-10-CM

## 2014-10-31 DIAGNOSIS — E875 Hyperkalemia: Secondary | ICD-10-CM | POA: Insufficient documentation

## 2014-10-31 DIAGNOSIS — J690 Pneumonitis due to inhalation of food and vomit: Secondary | ICD-10-CM | POA: Diagnosis present

## 2014-10-31 DIAGNOSIS — E871 Hypo-osmolality and hyponatremia: Secondary | ICD-10-CM | POA: Diagnosis present

## 2014-10-31 DIAGNOSIS — J9600 Acute respiratory failure, unspecified whether with hypoxia or hypercapnia: Secondary | ICD-10-CM | POA: Diagnosis not present

## 2014-10-31 DIAGNOSIS — R402312 Coma scale, best motor response, none, at arrival to emergency department: Secondary | ICD-10-CM | POA: Diagnosis present

## 2014-10-31 DIAGNOSIS — I4581 Long QT syndrome: Secondary | ICD-10-CM | POA: Diagnosis present

## 2014-10-31 DIAGNOSIS — E874 Mixed disorder of acid-base balance: Secondary | ICD-10-CM | POA: Diagnosis present

## 2014-10-31 DIAGNOSIS — D631 Anemia in chronic kidney disease: Secondary | ICD-10-CM | POA: Diagnosis present

## 2014-10-31 DIAGNOSIS — R402112 Coma scale, eyes open, never, at arrival to emergency department: Secondary | ICD-10-CM | POA: Diagnosis present

## 2014-10-31 DIAGNOSIS — T8612 Kidney transplant failure: Secondary | ICD-10-CM | POA: Diagnosis present

## 2014-10-31 DIAGNOSIS — J811 Chronic pulmonary edema: Secondary | ICD-10-CM

## 2014-10-31 DIAGNOSIS — D696 Thrombocytopenia, unspecified: Secondary | ICD-10-CM | POA: Diagnosis present

## 2014-10-31 DIAGNOSIS — G931 Anoxic brain damage, not elsewhere classified: Secondary | ICD-10-CM | POA: Diagnosis not present

## 2014-10-31 DIAGNOSIS — Z8611 Personal history of tuberculosis: Secondary | ICD-10-CM

## 2014-10-31 DIAGNOSIS — Z9981 Dependence on supplemental oxygen: Secondary | ICD-10-CM | POA: Diagnosis not present

## 2014-10-31 DIAGNOSIS — Z992 Dependence on renal dialysis: Secondary | ICD-10-CM

## 2014-10-31 DIAGNOSIS — E46 Unspecified protein-calorie malnutrition: Secondary | ICD-10-CM | POA: Diagnosis present

## 2014-10-31 DIAGNOSIS — N186 End stage renal disease: Secondary | ICD-10-CM

## 2014-10-31 DIAGNOSIS — Z794 Long term (current) use of insulin: Secondary | ICD-10-CM | POA: Diagnosis not present

## 2014-10-31 DIAGNOSIS — I953 Hypotension of hemodialysis: Secondary | ICD-10-CM | POA: Diagnosis not present

## 2014-10-31 DIAGNOSIS — E119 Type 2 diabetes mellitus without complications: Secondary | ICD-10-CM

## 2014-10-31 DIAGNOSIS — I5043 Acute on chronic combined systolic (congestive) and diastolic (congestive) heart failure: Secondary | ICD-10-CM | POA: Diagnosis present

## 2014-10-31 DIAGNOSIS — I429 Cardiomyopathy, unspecified: Secondary | ICD-10-CM | POA: Diagnosis not present

## 2014-10-31 DIAGNOSIS — E11319 Type 2 diabetes mellitus with unspecified diabetic retinopathy without macular edema: Secondary | ICD-10-CM | POA: Diagnosis present

## 2014-10-31 DIAGNOSIS — N19 Unspecified kidney failure: Secondary | ICD-10-CM

## 2014-10-31 DIAGNOSIS — I469 Cardiac arrest, cause unspecified: Secondary | ICD-10-CM | POA: Diagnosis present

## 2014-10-31 DIAGNOSIS — J9690 Respiratory failure, unspecified, unspecified whether with hypoxia or hypercapnia: Secondary | ICD-10-CM

## 2014-10-31 LAB — PROTIME-INR
INR: 1.16 (ref 0.00–1.49)
INR: 1.54 — AB (ref 0.00–1.49)
INR: 1.25 (ref 0.00–1.49)
PROTHROMBIN TIME: 14.9 s (ref 11.6–15.2)
PROTHROMBIN TIME: 15.9 s — AB (ref 11.6–15.2)
Prothrombin Time: 18.6 seconds — ABNORMAL HIGH (ref 11.6–15.2)

## 2014-10-31 LAB — POCT I-STAT 3, ART BLOOD GAS (G3+)
Acid-base deficit: 2 mmol/L (ref 0.0–2.0)
Acid-base deficit: 3 mmol/L — ABNORMAL HIGH (ref 0.0–2.0)
BICARBONATE: 23.8 meq/L (ref 20.0–24.0)
BICARBONATE: 23.1 meq/L (ref 20.0–24.0)
O2 SAT: 92 %
O2 Saturation: 84 %
Patient temperature: 98.6
TCO2: 25 mmol/L (ref 0–100)
TCO2: 24 mmol/L (ref 0–100)
pCO2 arterial: 43.8 mmHg (ref 35.0–45.0)
pCO2 arterial: 42.7 mmHg (ref 35.0–45.0)
pH, Arterial: 7.343 — ABNORMAL LOW (ref 7.350–7.450)
pH, Arterial: 7.342 — ABNORMAL LOW (ref 7.350–7.450)
pO2, Arterial: 52 mmHg — ABNORMAL LOW (ref 80.0–100.0)
pO2, Arterial: 68 mmHg — ABNORMAL LOW (ref 80.0–100.0)

## 2014-10-31 LAB — GLUCOSE, CAPILLARY
GLUCOSE-CAPILLARY: 219 mg/dL — AB (ref 70–99)
GLUCOSE-CAPILLARY: 217 mg/dL — AB (ref 70–99)
Glucose-Capillary: 111 mg/dL — ABNORMAL HIGH (ref 70–99)
Glucose-Capillary: 228 mg/dL — ABNORMAL HIGH (ref 70–99)
Glucose-Capillary: 279 mg/dL — ABNORMAL HIGH (ref 70–99)

## 2014-10-31 LAB — I-STAT CHEM 8, ED
BUN: 56 mg/dL — ABNORMAL HIGH (ref 6–23)
Calcium, Ion: 1.42 mmol/L — ABNORMAL HIGH (ref 1.12–1.23)
Chloride: 99 mmol/L (ref 96–112)
Creatinine, Ser: 9.3 mg/dL — ABNORMAL HIGH (ref 0.50–1.10)
Glucose, Bld: 308 mg/dL — ABNORMAL HIGH (ref 70–99)
HCT: 40 % (ref 36.0–46.0)
Hemoglobin: 13.6 g/dL (ref 12.0–15.0)
POTASSIUM: 8.1 mmol/L — AB (ref 3.5–5.1)
Sodium: 125 mmol/L — ABNORMAL LOW (ref 135–145)
TCO2: 20 mmol/L (ref 0–100)

## 2014-10-31 LAB — BASIC METABOLIC PANEL
ANION GAP: 18 — AB (ref 5–15)
ANION GAP: 14 (ref 5–15)
Anion gap: 14 (ref 5–15)
BUN: 47 mg/dL — AB (ref 6–23)
BUN: 21 mg/dL (ref 6–23)
BUN: 25 mg/dL — ABNORMAL HIGH (ref 6–23)
CALCIUM: 11.1 mg/dL — AB (ref 8.4–10.5)
CHLORIDE: 95 mmol/L — AB (ref 96–112)
CO2: 20 mmol/L (ref 19–32)
CO2: 23 mmol/L (ref 19–32)
CO2: 26 mmol/L (ref 19–32)
CREATININE: 5.18 mg/dL — AB (ref 0.50–1.10)
Calcium: 9.6 mg/dL (ref 8.4–10.5)
Calcium: 9.2 mg/dL (ref 8.4–10.5)
Chloride: 91 mmol/L — ABNORMAL LOW (ref 96–112)
Chloride: 92 mmol/L — ABNORMAL LOW (ref 96–112)
Creatinine, Ser: 9.97 mg/dL — ABNORMAL HIGH (ref 0.50–1.10)
Creatinine, Ser: 5.99 mg/dL — ABNORMAL HIGH (ref 0.50–1.10)
GFR calc Af Amer: 5 mL/min — ABNORMAL LOW (ref >90)
GFR calc Af Amer: 11 mL/min — ABNORMAL LOW (ref >90)
GFR calc non Af Amer: 10 mL/min — ABNORMAL LOW (ref >90)
GFR calc non Af Amer: 8 mL/min — ABNORMAL LOW (ref >90)
GFR, EST AFRICAN AMERICAN: 9 mL/min — AB (ref >90)
GFR, EST NON AFRICAN AMERICAN: 4 mL/min — AB (ref >90)
GLUCOSE: 287 mg/dL — AB (ref 70–99)
GLUCOSE: 324 mg/dL — AB (ref 70–99)
Glucose, Bld: 298 mg/dL — ABNORMAL HIGH (ref 70–99)
Potassium: >7.5 mmol/L (ref 3.5–5.1)
Potassium: 4.9 mmol/L (ref 3.5–5.1)
Potassium: 5 mmol/L (ref 3.5–5.1)
SODIUM: 129 mmol/L — AB (ref 135–145)
SODIUM: 132 mmol/L — AB (ref 135–145)
Sodium: 132 mmol/L — ABNORMAL LOW (ref 135–145)

## 2014-10-31 LAB — POCT I-STAT, CHEM 8
BUN: 44 mg/dL — AB (ref 6–23)
BUN: 44 mg/dL — ABNORMAL HIGH (ref 6–23)
BUN: 25 mg/dL — AB (ref 6–23)
BUN: 24 mg/dL — AB (ref 6–23)
CALCIUM ION: 1.74 mmol/L — AB (ref 1.12–1.23)
CHLORIDE: 100 mmol/L (ref 96–112)
CREATININE: 9.1 mg/dL — AB (ref 0.50–1.10)
CREATININE: 9.5 mg/dL — AB (ref 0.50–1.10)
CREATININE: 5 mg/dL — AB (ref 0.50–1.10)
Calcium, Ion: 1.68 mmol/L — ABNORMAL HIGH (ref 1.12–1.23)
Calcium, Ion: 1.33 mmol/L — ABNORMAL HIGH (ref 1.12–1.23)
Calcium, Ion: 1.34 mmol/L — ABNORMAL HIGH (ref 1.12–1.23)
Chloride: 103 mmol/L (ref 96–112)
Chloride: 109 mmol/L (ref 96–112)
Chloride: 109 mmol/L (ref 96–112)
Creatinine, Ser: 5.1 mg/dL — ABNORMAL HIGH (ref 0.50–1.10)
GLUCOSE: 179 mg/dL — AB (ref 70–99)
Glucose, Bld: 252 mg/dL — ABNORMAL HIGH (ref 70–99)
Glucose, Bld: 256 mg/dL — ABNORMAL HIGH (ref 70–99)
Glucose, Bld: 222 mg/dL — ABNORMAL HIGH (ref 70–99)
HCT: 32 % — ABNORMAL LOW (ref 36.0–46.0)
HCT: 32 % — ABNORMAL LOW (ref 36.0–46.0)
HEMATOCRIT: 28 % — AB (ref 36.0–46.0)
HEMATOCRIT: 39 % (ref 36.0–46.0)
Hemoglobin: 10.9 g/dL — ABNORMAL LOW (ref 12.0–15.0)
Hemoglobin: 10.9 g/dL — ABNORMAL LOW (ref 12.0–15.0)
Hemoglobin: 9.5 g/dL — ABNORMAL LOW (ref 12.0–15.0)
Hemoglobin: 13.3 g/dL (ref 12.0–15.0)
Potassium: 7.5 mmol/L (ref 3.5–5.1)
Potassium: 7.6 mmol/L (ref 3.5–5.1)
Potassium: 4.1 mmol/L (ref 3.5–5.1)
Potassium: 4.6 mmol/L (ref 3.5–5.1)
SODIUM: 141 mmol/L (ref 135–145)
SODIUM: 136 mmol/L (ref 135–145)
Sodium: 125 mmol/L — ABNORMAL LOW (ref 135–145)
Sodium: 128 mmol/L — ABNORMAL LOW (ref 135–145)
TCO2: 19 mmol/L (ref 0–100)
TCO2: 19 mmol/L (ref 0–100)
TCO2: 16 mmol/L (ref 0–100)
TCO2: 22 mmol/L (ref 0–100)

## 2014-10-31 LAB — I-STAT ARTERIAL BLOOD GAS, ED
Acid-base deficit: 12 mmol/L — ABNORMAL HIGH (ref 0.0–2.0)
Bicarbonate: 19.4 mEq/L — ABNORMAL LOW (ref 20.0–24.0)
O2 SAT: 74 %
PH ART: 7.006 — AB (ref 7.350–7.450)
PO2 ART: 60 mmHg — AB (ref 80.0–100.0)
Patient temperature: 98.6
TCO2: 22 mmol/L (ref 0–100)
pCO2 arterial: 77.6 mmHg (ref 35.0–45.0)

## 2014-10-31 LAB — I-STAT TROPONIN, ED: TROPONIN I, POC: 0.23 ng/mL — AB (ref 0.00–0.08)

## 2014-10-31 LAB — BLOOD GAS, ARTERIAL
ACID-BASE DEFICIT: 2.9 mmol/L — AB (ref 0.0–2.0)
BICARBONATE: 20.7 meq/L (ref 20.0–24.0)
Drawn by: 39898
FIO2: 100 %
MECHVT: 500 mL
O2 SAT: 98.3 %
PATIENT TEMPERATURE: 98.6
PEEP: 14 cmH2O
RATE: 30 resp/min
TCO2: 21.7 mmol/L (ref 0–100)
pCO2 arterial: 31.7 mmHg — ABNORMAL LOW (ref 35.0–45.0)
pH, Arterial: 7.431 (ref 7.350–7.450)
pO2, Arterial: 328 mmHg — ABNORMAL HIGH (ref 80.0–100.0)

## 2014-10-31 LAB — CBC
HEMATOCRIT: 28.6 % — AB (ref 36.0–46.0)
Hemoglobin: 9.3 g/dL — ABNORMAL LOW (ref 12.0–15.0)
MCH: 30.2 pg (ref 26.0–34.0)
MCHC: 32.5 g/dL (ref 30.0–36.0)
MCV: 92.9 fL (ref 78.0–100.0)
PLATELETS: ADEQUATE 10*3/uL (ref 150–400)
RBC: 3.08 MIL/uL — ABNORMAL LOW (ref 3.87–5.11)
RDW: 16.1 % — ABNORMAL HIGH (ref 11.5–15.5)
WBC: 35.7 10*3/uL — ABNORMAL HIGH (ref 4.0–10.5)

## 2014-10-31 LAB — TROPONIN I
TROPONIN I: 2.44 ng/mL — AB (ref <0.031)
Troponin I: 2 ng/mL (ref <0.031)

## 2014-10-31 LAB — MRSA PCR SCREENING: MRSA by PCR: NEGATIVE

## 2014-10-31 LAB — APTT
APTT: 148 s — AB (ref 24–37)
aPTT: 51 seconds — ABNORMAL HIGH (ref 24–37)
aPTT: 42 seconds — ABNORMAL HIGH (ref 24–37)

## 2014-10-31 MED ORDER — SODIUM BICARBONATE 8.4 % IV SOLN
INTRAVENOUS | Status: DC
  Administered 2014-10-31 – 2014-11-01 (×2): via INTRAVENOUS
  Filled 2014-10-31 (×4): qty 150

## 2014-10-31 MED ORDER — MIDAZOLAM HCL 2 MG/2ML IJ SOLN: 2.0000 mg | INTRAMUSCULAR | Status: DC | PRN

## 2014-10-31 MED ORDER — SODIUM CHLORIDE 0.9 % IV SOLN: 100.0000 mL | INTRAVENOUS | Status: DC | PRN

## 2014-10-31 MED ORDER — DEXTROSE 50 % IV SOLN
1.0000 | Freq: Once | INTRAVENOUS | Status: AC
  Administered 2014-10-31: 50 mL via INTRAVENOUS

## 2014-10-31 MED ORDER — CISATRACURIUM BOLUS VIA INFUSION
0.1000 mg/kg | Freq: Once | INTRAVENOUS | Status: AC
  Administered 2014-10-31: 7.9 mg via INTRAVENOUS
  Filled 2014-10-31: qty 8

## 2014-10-31 MED ORDER — INSULIN ASPART 100 UNIT/ML IV SOLN
INTRAVENOUS | Status: AC
  Administered 2014-10-31: 10 [IU] via INTRAVENOUS
  Filled 2014-10-31: qty 1

## 2014-10-31 MED ORDER — ASPIRIN 300 MG RE SUPP
300.0000 mg | RECTAL | Status: AC
  Administered 2014-10-31: 300 mg via RECTAL
  Filled 2014-10-31: qty 1

## 2014-10-31 MED ORDER — NEPRO/CARBSTEADY PO LIQD
237.0000 mL | ORAL | Status: DC | PRN
  Filled 2014-10-31: qty 237

## 2014-10-31 MED ORDER — EPINEPHRINE HCL 0.1 MG/ML IJ SOSY
PREFILLED_SYRINGE | INTRAMUSCULAR | Status: AC | PRN
  Administered 2014-10-31 (×8): 1 via INTRAVENOUS

## 2014-10-31 MED ORDER — SODIUM CHLORIDE 0.9 % IV SOLN
1.0000 mg/h | INTRAVENOUS | Status: DC
  Administered 2014-10-31: 1 mg/h via INTRAVENOUS
  Administered 2014-11-01: 3 mg/h via INTRAVENOUS
  Filled 2014-10-31 (×4): qty 10

## 2014-10-31 MED ORDER — CISATRACURIUM BOLUS VIA INFUSION
0.0500 mg/kg | INTRAVENOUS | Status: AC | PRN
  Administered 2014-11-01: 3.9 mg via INTRAVENOUS
  Filled 2014-10-31: qty 4

## 2014-10-31 MED ORDER — CHLORHEXIDINE GLUCONATE 0.12 % MT SOLN
15.0000 mL | Freq: Two times a day (BID) | OROMUCOSAL | Status: DC
Start: 2014-10-31 — End: 2014-11-07
  Administered 2014-10-31 – 2014-11-07 (×14): 15 mL via OROMUCOSAL
  Filled 2014-10-31 (×14): qty 15

## 2014-10-31 MED ORDER — SODIUM BICARBONATE 8.4 % IV SOLN
INTRAVENOUS | Status: AC | PRN
  Administered 2014-10-31 (×2): 50 meq via INTRAVENOUS

## 2014-10-31 MED ORDER — SODIUM CHLORIDE 0.9 % IV SOLN
2000.0000 mL | Freq: Once | INTRAVENOUS | Status: AC
  Administered 2014-10-31: 2000 mL via INTRAVENOUS

## 2014-10-31 MED ORDER — SODIUM CHLORIDE 0.9 % IV SOLN
INTRAVENOUS | Status: DC
  Administered 2014-10-31: 3.1 [IU]/h via INTRAVENOUS
  Filled 2014-10-31: qty 2.5

## 2014-10-31 MED ORDER — FENTANYL BOLUS VIA INFUSION
50.0000 ug | INTRAVENOUS | Status: DC | PRN
  Administered 2014-11-03 – 2014-11-06 (×3): 50 ug via INTRAVENOUS
  Filled 2014-10-31: qty 50

## 2014-10-31 MED ORDER — CALCIUM CHLORIDE 10 % IV SOLN
INTRAVENOUS | Status: AC | PRN
  Administered 2014-10-31 (×5): 1 g via INTRAVENOUS

## 2014-10-31 MED ORDER — PENTAFLUOROPROP-TETRAFLUOROETH EX AERO: 1.0000 "application " | INHALATION_SPRAY | CUTANEOUS | Status: DC | PRN

## 2014-10-31 MED ORDER — INSULIN ASPART 100 UNIT/ML ~~LOC~~ SOLN
10.0000 [IU] | Freq: Once | SUBCUTANEOUS | Status: AC
  Administered 2014-10-31: 10 [IU] via INTRAVENOUS

## 2014-10-31 MED ORDER — SODIUM CHLORIDE 0.9 % IV SOLN: 25.0000 ug/h | INTRAVENOUS | Status: DC

## 2014-10-31 MED ORDER — MIDAZOLAM BOLUS VIA INFUSION
2.0000 mg | INTRAVENOUS | Status: DC | PRN
  Filled 2014-10-31: qty 2

## 2014-10-31 MED ORDER — DOPAMINE-DEXTROSE 3.2-5 MG/ML-% IV SOLN
INTRAVENOUS | Status: AC | PRN
  Administered 2014-10-31: 7.3 ug/kg/min via INTRAVENOUS

## 2014-10-31 MED ORDER — SODIUM CHLORIDE 0.9 % IV SOLN
1.0000 ug/kg/min | INTRAVENOUS | Status: DC
  Administered 2014-10-31 – 2014-11-01 (×3): 1 ug/kg/min via INTRAVENOUS
  Filled 2014-10-31 (×2): qty 20

## 2014-10-31 MED ORDER — INSULIN ASPART 100 UNIT/ML ~~LOC~~ SOLN
2.0000 [IU] | SUBCUTANEOUS | Status: DC
  Administered 2014-10-31: 6 [IU] via SUBCUTANEOUS

## 2014-10-31 MED ORDER — SODIUM CHLORIDE 0.9 % IV SOLN
0.0300 [IU]/min | INTRAVENOUS | Status: DC
  Administered 2014-10-31: 0.03 [IU]/min via INTRAVENOUS
  Filled 2014-10-31 (×2): qty 2

## 2014-10-31 MED ORDER — SODIUM CHLORIDE 0.9 % IV SOLN
1.5000 g | Freq: Two times a day (BID) | INTRAVENOUS | Status: DC
  Administered 2014-10-31 (×2): 1.5 g via INTRAVENOUS
  Filled 2014-10-31 (×3): qty 1.5

## 2014-10-31 MED ORDER — LIDOCAINE HCL (PF) 1 % IJ SOLN: 5.0000 mL | INTRAMUSCULAR | Status: DC | PRN

## 2014-10-31 MED ORDER — HEPARIN SODIUM (PORCINE) 1000 UNIT/ML DIALYSIS: 1000.0000 [IU] | INTRAMUSCULAR | Status: DC | PRN

## 2014-10-31 MED ORDER — NOREPINEPHRINE BITARTRATE 1 MG/ML IV SOLN
0.0000 ug/min | INTRAVENOUS | Status: DC
  Administered 2014-10-31: 5 ug/min via INTRAVENOUS
  Administered 2014-10-31: 12 ug/min via INTRAVENOUS
  Filled 2014-10-31: qty 4

## 2014-10-31 MED ORDER — SODIUM CHLORIDE 0.9 % IV SOLN: 1.0000 mg/h | INTRAVENOUS | Status: DC

## 2014-10-31 MED ORDER — SODIUM CHLORIDE 0.9 % IV SOLN
25.0000 ug/h | INTRAVENOUS | Status: DC
  Administered 2014-10-31: 75 ug/h via INTRAVENOUS
  Administered 2014-11-01: 200 ug/h via INTRAVENOUS
  Administered 2014-11-02: 75 ug/h via INTRAVENOUS
  Administered 2014-11-04 – 2014-11-07 (×3): 25 ug/h via INTRAVENOUS
  Filled 2014-10-31 (×7): qty 50

## 2014-10-31 MED ORDER — LIDOCAINE-PRILOCAINE 2.5-2.5 % EX CREA: 1.0000 "application " | TOPICAL_CREAM | CUTANEOUS | Status: DC | PRN

## 2014-10-31 MED ORDER — DOPAMINE-DEXTROSE 3.2-5 MG/ML-% IV SOLN
0.0000 ug/kg/min | INTRAVENOUS | Status: DC
  Administered 2014-10-31: 20 ug/kg/min via INTRAVENOUS
  Filled 2014-10-31: qty 250

## 2014-10-31 MED ORDER — ALTEPLASE 2 MG IJ SOLR
2.0000 mg | Freq: Once | INTRAMUSCULAR | Status: AC | PRN
  Filled 2014-10-31: qty 2

## 2014-10-31 MED ORDER — ARTIFICIAL TEARS OP OINT
1.0000 "application " | TOPICAL_OINTMENT | Freq: Three times a day (TID) | OPHTHALMIC | Status: DC
  Administered 2014-10-31 – 2014-11-01 (×5): 1 via OPHTHALMIC
  Filled 2014-10-31 (×2): qty 3.5

## 2014-10-31 MED ORDER — SODIUM CHLORIDE 0.9 % IV SOLN
INTRAVENOUS | Status: DC
  Administered 2014-10-31: 12:00:00 via INTRAVENOUS

## 2014-10-31 MED ORDER — DEXTROSE 5 % IV SOLN
0.0000 ug/min | INTRAVENOUS | Status: DC
  Administered 2014-10-31: 50 ug/min via INTRAVENOUS
  Administered 2014-11-01: 18 ug/min via INTRAVENOUS
  Administered 2014-11-02: 20 ug/min via INTRAVENOUS
  Filled 2014-10-31 (×4): qty 16

## 2014-10-31 MED ORDER — PANTOPRAZOLE SODIUM 40 MG IV SOLR
40.0000 mg | INTRAVENOUS | Status: DC
  Administered 2014-10-31 – 2014-11-06 (×7): 40 mg via INTRAVENOUS
  Filled 2014-10-31 (×8): qty 40

## 2014-10-31 MED ORDER — FENTANYL CITRATE 0.05 MG/ML IJ SOLN: 100.0000 ug | INTRAMUSCULAR | Status: DC | PRN

## 2014-10-31 MED ORDER — CETYLPYRIDINIUM CHLORIDE 0.05 % MT LIQD
7.0000 mL | Freq: Four times a day (QID) | OROMUCOSAL | Status: DC
  Administered 2014-10-31 – 2014-11-07 (×26): 7 mL via OROMUCOSAL

## 2014-10-31 MED ORDER — ETOMIDATE 2 MG/ML IV SOLN
INTRAVENOUS | Status: AC | PRN
  Administered 2014-10-31: 20 mg via INTRAVENOUS

## 2014-10-31 MED FILL — Medication: Qty: 1 | Status: AC

## 2014-10-31 NOTE — Procedures (Signed)
Central Venous Catheter Insertion Procedure Note [name] [id number] 1976/06/09  Procedure: Insertion of Central Venous Catheter Indications: Assessment of intravascular volume, Drug and/or fluid administration and Frequent blood sampling  Procedure Details Consent: Unable to obtain consent because of emergent medical necessity. Time Out: Verified patient identification, verified procedure, site/side was marked, verified correct patient position, special equipment/implants available, medications/allergies/relevent history reviewed, required imaging and test results available.  Performed  Maximum sterile technique was used including antiseptics, cap, gloves, gown, hand hygiene, mask and sheet. Skin prep: Chlorhexidine; local anesthetic administered A antimicrobial bonded/coated triple lumen catheter was placed in the right femoral vein due to emergent situation using the Seldinger technique.  Evaluation Blood flow good Complications: No apparent complications Patient did tolerate procedure well. Chest X-ray ordered to verify placement.  CXR: normal.  U/S used in placement.  YACOUB,WESAM 10/31/2014, 10:44 AM

## 2014-10-31 NOTE — Consult Note (Signed)
ELECTROPHYSIOLOGY CONSULT NOTE  Patient ID: [name], MRN: [id number], DOB/AGE: Sep 20, 1976 39 y.o. Admit date: 10/31/2014 Date of Consult: 10/31/2014  Primary Physician: Tommy Medal, MD Primary Cardiologist: new   Chief Complaint: VF arrest   HPI [name] is a 39 y.o. 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 HTN (hypertension)     not well controlled  . 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 History of blood transfusion     "lots of times" (01/27/2013)  . CAP (community acquired pneumonia) 2010  . Type II diabetes mellitus   . Anemia   . Ovarian tumor, malignant 2005    resected in [place]' pt denies that this was cancer on 01/27/2013  . Diabetic retinopathy   . On home oxygen therapy     "5L; 24/7" (01/25/2014)  . ESRD on hemodialysis 08/25/2013    Started HD in 2008.  Had [place] transplant in 2010 at [facility].  Went back on HD in 2012.  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  The kidney was left in.  She now gets HD at [facility] on TTS schedule.     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  Both organs have since rejected, see "ESRD" for details.       Surgical History:  Past Surgical  History  Procedure Laterality Date  . Tumor removal  2005    Ovarian, resected in [place]  . Av fistula placement  06/03/2012    Procedure: INSERTION OF ARTERIOVENOUS (AV) GORE-TEX GRAFT ARM;  Surgeon: Rosetta Posner, MD;  Location: [place];  Service: Vascular;  Laterality: Left;  [name] Eye surgery Right     "cause of diabetes" (4/23/20140  . Combined kidney-pancreas transplant  2010    /notes 01/27/2013; @ [facility]  . 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 Renal biopsy  12/13/2010; 10/25/2011    Archie Endo 01/27/2013  . Tee without cardioversion N/A 04/22/2014    Procedure: TRANSESOPHAGEAL ECHOCARDIOGRAM (TEE);  Surgeon: Fay Records, MD;  Location: [facility] ENDOSCOPY;  Service: Cardiovascular;  Laterality: N/A;  [place]: Prior to Admission medications   Medication Sig Start Date End Date Taking? Authorizing Provider  acetaminophen (TYLENOL) 500 MG tablet Take 1,000 mg by mouth every 6 (six) hours as needed for moderate pain.   Yes Historical Provider, MD  amLODipine (NORVASC) 10 MG tablet Take 10 mg by mouth at bedtime.    Yes Historical Provider, MD  aspirin EC 81 MG  tablet Take 81 mg by mouth at bedtime.    Yes Historical Provider, MD  B Complex-C-Folic Acid (RENA-VITE PO) Take 1 tablet by mouth daily.   Yes Historical Provider, MD  calcium acetate (PHOSLO) 667 MG capsule Take 3 capsules (2,001 mg total) by mouth 3 (three) times daily with meals. 01/28/13  Yes Myriam Jacobson, PA-C  cinacalcet (SENSIPAR) 30 MG tablet Take 30 mg by mouth daily.   Yes Historical Provider, MD  cloNIDine (CATAPRES) 0.1 MG tablet Take 0.1 mg by mouth 2 (two) times daily.    Yes Historical Provider, MD  diphenhydrAMINE (BENADRYL) 25 mg capsule Take 25 mg by mouth daily as needed for allergies.   Yes Historical Provider, MD  escitalopram (LEXAPRO) 20 MG tablet Take 10 mg by mouth daily.    Yes Historical Provider, MD  insulin aspart (NOVOLOG) 100 UNIT/ML  injection Inject 12 Units into the skin 2 (two) times daily with breakfast and lunch.    Yes Historical Provider, MD  insulin glargine (LANTUS) 100 UNIT/ML injection Inject 0.5 mLs (50 Units total) into the skin at bedtime. 04/23/14  Yes Delfina Redwood, MD  labetalol (NORMODYNE) 300 MG tablet Take 300 mg by mouth daily.    Yes Historical Provider, MD  metoCLOPramide (REGLAN) 5 MG tablet Take 5 mg by mouth 4 (four) times daily.   Yes Historical Provider, MD  pantoprazole (PROTONIX) 40 MG tablet Take 40 mg by mouth daily.   Yes Historical Provider, MD  simvastatin (ZOCOR) 20 MG tablet Take 20 mg by mouth every Monday, Wednesday, and Friday.   Yes Historical Provider, MD  ceFAZolin (ANCEF) 2-3 GM-% SOLR Inject 50 mLs (2 g total) into the vein Every Tuesday,Thursday,and Saturday with dialysis. Through 8/2, then stop Patient not taking: Reported on 10/31/2014 04/23/14   Delfina Redwood, MD    Inpatient Medications:  . sodium chloride  2,000 mL Intravenous Once  . ampicillin-sulbactam (UNASYN) IV  1.5 g Intravenous Q12H  . artificial tears  1 application Both Eyes 3 times per day  . aspirin  300 mg Rectal NOW  . cisatracurium  0.1 mg/kg Intravenous Once     Allergies:  Allergies  Allergen Reactions  . Ace Inhibitors Cough    History   Social History  . Marital Status: Married    Spouse Name: N/A    Number of Children: 1  . Years of Education: N/A   Occupational History  . Unemployed    Social History Main Topics  . Smoking status: Never Smoker   . Smokeless tobacco: Never Used  . Alcohol Use: No  . Drug Use: No  . Sexual Activity: No   Other Topics Concern  . Not on file   Social History Narrative   Lives with husband and son (66yrs old)..Unemployed                    Family History  Problem Relation Age of Onset  . Diabetes Mother   . Liver cancer Father   . Cancer Father      ROS:  Please see the history of present illness.   not obtainable 2/2 intubation   All other systems reviewed and negative.    Physical Exam:   Blood pressure 105/63, pulse 113, temperature 92 F (33.3 C), temperature source Core (Comment), resp. rate 30, height 5\' 7"  (1.702 m), weight 181 lb 14.1 oz (82.5 kg), SpO2 90 %. General: Well developed, well nourished female intubated non responsive and cold Head:  Normocephalic, atraumatic, sclera non-icteric, no xanthomas, nares are without discharge. EENT: normal Lymph Nodes:  none Back: not examined Neck: Not examined Lungs: Clear  laterally   Heart: Rapid but RRR with S1 S2. 2/6 systolicmurmur , rubs, or gallops appreciated. Abdomen: Soft, non-tender, non-distended with normoactive bowel sounds. No hepatomegaly. No rebound/guarding. No obvious abdominal masses. Msk:  Strength and tone appear normal for age. Extremities: No clubbing or cyanosis. No + edema.  Distal pedal pulses are 2+ and equal bilaterally. Skin: Warm and Dry Neuro: Alert and oriented X 3. CN III-XII intact Grossly normal sensory and motor function . Psych:  Responds to questions appropriately with a normal affect.      Labs: Cardiac Enzymes No results for input(s): CKTOTAL, CKMB, TROPONINI in the last 72 hours. CBC Lab Results  Component Value Date   WBC 35.7* 10/31/2014   HGB 9.5* 10/31/2014   HCT 28.0* 10/31/2014   MCV 92.9 10/31/2014   PLT  10/31/2014    PLATELET CLUMPS NOTED ON SMEAR, COUNT APPEARS ADEQUATE   PROTIME:  Recent Labs  10/31/14 0954 10/31/14 1055  LABPROT 14.9 18.6*  INR 1.16 1.54*   Chemistry  Recent Labs Lab 10/31/14 1100  10/31/14 1420  NA 129*  < > 141  K >7.5*  < > 4.1  CL 91*  < > 109  CO2 20  --   --   BUN 47*  < > 25*  CREATININE 9.97*  < > 5.10*  CALCIUM 11.1*  --   --   GLUCOSE 298*  < > 179*  < > = values in this interval not displayed. Lipids No results found for: CHOL, HDL, LDLCALC, TRIG BNP PRO B NATRIURETIC PEPTIDE (BNP)  Date/Time Value Ref Range Status  04/18/2014 07:38 PM >70000.0* 0 -  125 pg/mL Final  02/01/2013 11:44 PM 58488.0* 0 - 125 pg/mL Final   Miscellaneous Lab Results  Component Value Date   DDIMER 0.66* 06/09/2012    Radiology/Studies:  Ct Head Wo Contrast  10/31/2014   CLINICAL DATA:  Dialysis patient with recent cardiac arrest, uncontrollable bleeding  EXAM: CT HEAD WITHOUT CONTRAST  TECHNIQUE: Contiguous axial images were obtained from the base of the skull through the vertex without intravenous contrast.  COMPARISON:  None.  FINDINGS: The bony calvarium is intact. Air-fluid levels are noted within the sphenoid sinuses which may be related to acute sinusitis. Endotracheal tube and nasogastric catheter are noted. No findings to suggest acute hemorrhage, acute infarction or space-occupying mass lesion are noted.  IMPRESSION: Changes in the sphenoid sinuses which may be related to acute sinusitis. No other focal abnormality is seen.   Electronically Signed   By: Inez Catalina M.D.   On: 10/31/2014 11:19   Dg Chest Port 1 View  10/31/2014   CLINICAL DATA:  OG tube placement.  EXAM: PORTABLE CHEST - 1 VIEW  COMPARISON:  One-view chest 04/18/2014.  FINDINGS: The patient has been intubated. The endotracheal tube terminates 4.9 cm above the carina. The heart is enlarged. Diffuse interstitial and airspace disease has progressed with more consolidative changes now on the right upper lobe and left lower lobe. The left hemi thorax is obscured by a to fibrillation pad. The orogastric tube courses off the inferior border of the film, within the stomach.  IMPRESSION: 1. Interval intubation. The endotracheal tube is in satisfactory position. 2. OG tube courses off the inferior border the film. 3. Cardiomegaly with increasing interstitial and airspace disease. This likely reflects a combination of edema and  atelectasis. Infection or aspiration is also considered.   Electronically Signed   By: Lawrence Santiago M.D.   On: 10/31/2014 10:21    EKG: ECG as noted aboe   Assessment and  Plan:  Aborted Cardiac Arrest  Hyperkalemia  ESRD  Cardiogenic shock  Respiratory failure  Terrible situation with prolonged ROSC with ongoing hypoxemia.  Previous LV function was normal and in the context of the electrolyte abnormality would presume ( for now) that the cardiac event is secondary.  Continue supportive therapy for now  Will follow  Thanks for consult     Virl Axe

## 2014-10-31 NOTE — Procedures (Signed)
CPR Procedure Note  Patient was being examined, cardiac arrest occurred during exam.  CPR started, ACLS protocol followed, see sheet.  ROSC and dopamine restarted.    Rush Farmer, M.D. [facility] Pulmonary/Critical Care Medicine. Pager: [phone]. After hours pager: [phone].

## 2014-10-31 NOTE — H&P (Signed)
PULMONARY / CRITICAL CARE MEDICINE   Name: [name] MRN: [id number] DOB: May 31, 1976    ADMISSION DATE:  10/31/2014  REFERRING MD :  EDP  CHIEF COMPLAINT:  Cardiac arrest   INITIAL PRESENTATION: 39 yo female with extensive PMH including HTN, ESRD, previous TB, DM, kidney-pancreas transplant 2010, on home O2 with witnessed arrest at home.  Had her HD as regularly scheduled on 1/22.  Husband performed CPR.  Wide complex rhythm on EMS arrival.  Total ~1 hour CPR during multiple episodes, 7 epi, 6 defib.  K+ in ER was 8.1.  PCCM called to admit.   STUDIES:  CT head 1/25>>  SIGNIFICANT EVENTS:    HISTORY OF PRESENT ILLNESS:  39 yo female with extensive PMH including HTN, ESRD, previous TB, DM, kidney-pancreas transplant 2010, on home O2 with witnessed arrest at home.  Husband performed CPR.  Wide complex rhythm on EMS arrival.  Total ~1 hour CPR, 7 epi, 6 defib.  K+ in ER was 8.1.  PCCM called to admit.    PAST MEDICAL HISTORY :   has a past medical history of HTN (hypertension); TB (pulmonary tuberculosis) (2003, 2007); History of blood transfusion; CAP (community acquired pneumonia) (2010); Type II diabetes mellitus; Anemia; Ovarian tumor, malignant (2005); Diabetic retinopathy; On home oxygen therapy; ESRD on hemodialysis (08/25/2013); and History of simultaneous kidney and pancreas transplant (04/19/2014).  has past surgical history that includes Tumor removal (2005); AV fistula placement (06/03/2012); Eye surgery (Right); Combined kidney-pancreas transplant (2010); Pancreatectomy (12/13/2010); Renal biopsy (12/13/2010; 10/25/2011); and TEE without cardioversion (N/A, 04/22/2014). Prior to Admission medications   Medication Sig Start Date End Date Taking? Authorizing Provider  acetaminophen (TYLENOL) 500 MG tablet Take 1,000 mg by mouth every 6 (six) hours as needed for moderate pain.    Historical Provider, MD  amLODipine (NORVASC) 10 MG tablet Take 10 mg by mouth at bedtime.     Historical  Provider, MD  aspirin EC 81 MG tablet Take 81 mg by mouth at bedtime.     Historical Provider, MD  calcium acetate (PHOSLO) 667 MG capsule Take 3 capsules (2,001 mg total) by mouth 3 (three) times daily with meals. 01/28/13   Myriam Jacobson, PA-C  ceFAZolin (ANCEF) 2-3 GM-% SOLR Inject 50 mLs (2 g total) into the vein Every Tuesday,Thursday,and Saturday with dialysis. Through 8/2, then stop 04/23/14   Delfina Redwood, MD  cloNIDine (CATAPRES) 0.1 MG tablet Take 0.1 mg by mouth daily.     Historical Provider, MD  diphenhydrAMINE (BENADRYL) 25 mg capsule Take 25 mg by mouth daily as needed for allergies.    Historical Provider, MD  escitalopram (LEXAPRO) 20 MG tablet Take 10 mg by mouth daily.     Historical Provider, MD  insulin aspart (NOVOLOG) 100 UNIT/ML injection Inject 12 Units into the skin 2 (two) times daily with breakfast and lunch.     Historical Provider, MD  insulin glargine (LANTUS) 100 UNIT/ML injection Inject 0.5 mLs (50 Units total) into the skin at bedtime. 04/23/14   Delfina Redwood, MD  labetalol (NORMODYNE) 300 MG tablet Take 300 mg by mouth daily.     Historical Provider, MD  metoCLOPramide (REGLAN) 5 MG tablet Take 5 mg by mouth 4 (four) times daily.    Historical Provider, MD  simvastatin (ZOCOR) 20 MG tablet Take 20 mg by mouth every Monday, Wednesday, and Friday.    Historical Provider, MD   Allergies  Allergen Reactions  . Ace Inhibitors Cough    FAMILY HISTORY:  indicated that her mother is alive. She indicated that her father is deceased.  SOCIAL HISTORY:  reports that she has never smoked. She has never used smokeless tobacco. She reports that she does not drink alcohol or use illicit drugs.  REVIEW OF SYSTEMS:  Unable.   SUBJECTIVE:   VITAL SIGNS: Temp:  [93.7 F (34.3 C)] 93.7 F (34.3 C) (01/25 1054) Pulse Rate:  [0-106] 58 (01/25 1049) Resp:  [17-28] 24 (01/25 1045) BP: (76-239)/(33-183) 99/44 mmHg (01/25 1049) SpO2:  [56 %-100 %] 68 % (01/25  1045) Arterial Line BP: (124)/(51) 124/51 mmHg (01/25 1045) FiO2 (%):  [100 %] 100 % (01/25 1006) Weight:  [173 lb 4.5 oz (78.6 kg)] 173 lb 4.5 oz (78.6 kg) (01/25 1039) HEMODYNAMICS:   VENTILATOR SETTINGS: Vent Mode:  [-] PRVC FiO2 (%):  [100 %] 100 % Set Rate:  [15 bmp-30 bmp] 30 bmp Vt Set:  [500 mL] 500 mL PEEP:  [5 cmH20-10 cmH20] 10 cmH20 Plateau Pressure:  [30 cmH20] 30 cmH20 INTAKE / OUTPUT: No intake or output data in the 24 hours ending 10/31/14 1055  PHYSICAL EXAMINATION: General:  Chronically ill appearing female, critically ill  Neuro:  Unresponsive  HEENT:  Mm dry, ETT, no JVD Cardiovascular:  s1s2 irreg, brady, wide complex Lungs:  resps labored on vent, coarse  Abdomen:  Round, soft, -bs  Musculoskeletal:  Cool, pale, weak pulses   LABS:  CBC  Recent Labs Lab 10/31/14 0942  HGB 13.6  HCT 40.0   Coag's  Recent Labs Lab 10/31/14 0954  APTT 51*  INR 1.16   BMET  Recent Labs Lab 10/31/14 0942  NA 125*  K 8.1*  CL 99  BUN 56*  CREATININE 9.30*  GLUCOSE 308*   Electrolytes No results for input(s): CALCIUM, MG, PHOS in the last 168 hours. Sepsis Markers No results for input(s): LATICACIDVEN, PROCALCITON, O2SATVEN in the last 168 hours. ABG  Recent Labs Lab 10/31/14 1048  PHART 7.006*  PCO2ART 77.6*  PO2ART 60.0*   Liver Enzymes No results for input(s): AST, ALT, ALKPHOS, BILITOT, ALBUMIN in the last 168 hours. Cardiac Enzymes No results for input(s): TROPONINI, PROBNP in the last 168 hours. Glucose No results for input(s): GLUCAP in the last 168 hours.  Imaging No results found.   ASSESSMENT / PLAN:  PULMONARY OETT 1/25>>> Acute respiratory failure - post cardiac arrest  ?aspiration PNA  Combined acidosis Hx TB  P:   Full vent support, f/u ABG. F/u abg  F/u CXR  abx as below  PRN BD  Increase RR 30   CARDIOVASCULAR CVL R fem 1/25>>> R fem aline 1/25>>>  Cardiac arrest - presumed r/t hyperkalemia in setting  ESRD  Hx HTN  P:  HD as below  Cycle troponin  Cont dopamine to keep MAP>65 If head CT neg will proceed with hypothermia protocol  Cards to see  F/u lactate  F/u chem   RENAL ESRD  Hyperkalemia  Hyponatremia  Mixed acidosis  P:   Serial chem  Renal to see - needs urgent HD  F/u abg  HCO3 gtt 100 cc/hr.  GASTROINTESTINAL No active issue  P:   PPI    HEMATOLOGIC Labs pending - no known issue  P:  Cbc now   INFECTIOUS ?aspiration  P:   Unasyn 1/25>>> F/U cultures.  ENDOCRINE DM  P:   ICU hyperglycemia protocol - will likely need insulin gtt   NEUROLOGIC AMS - post cardiac arrest  P:   RASS goal: -2 Hypothermia  protocol as above if CT head neg  EEG   FAMILY  - Updates:  No family available 1/25 - ED trying to contact husband   - Inter-disciplinary family meet or Palliative Care meeting due by:  2/1  Nickolas Madrid, NP 10/31/2014  10:55 AM Pager: [phone] or [phone]  Hyperkalemic cardiac arrest, full vent support, if head CT is negative will proceed with hypothermia, pan culture, unasyn, wil f/u.  The patient is critically ill with multiple organ systems failure and requires high complexity decision making for assessment and support, frequent evaluation and titration of therapies, application of advanced monitoring technologies and extensive interpretation of multiple databases.   Critical Care Time devoted to patient care services described in this note is  60  Minutes. This time reflects time of care of this signee Dr [name]. This critical care time does not reflect procedure time, or teaching time or supervisory time of PA/NP/Med student/Med Resident etc but could involve care discussion time.  Rush Farmer, M.D. [facility] Pulmonary/Critical Care Medicine. Pager: [phone]. After hours pager: [phone].

## 2014-10-31 NOTE — Procedures (Signed)
Arterial Catheter Insertion Procedure Note [name] [id number] April 17, 1976  Procedure: Insertion of Arterial Catheter  Indications: Blood pressure monitoring and Frequent blood sampling  Procedure Details Consent: Unable to obtain consent because of emergent medical necessity. Time Out: Verified patient identification, verified procedure, site/side was marked, verified correct patient position, special equipment/implants available, medications/allergies/relevent history reviewed, required imaging and test results available.  Performed  Maximum sterile technique was used including antiseptics, cap, gloves, gown, hand hygiene, mask and sheet. Skin prep: Chlorhexidine; local anesthetic administered 20 gauge catheter was inserted into right femoral artery using the Seldinger technique.  Evaluation Blood flow good; BP tracing good. Complications: No apparent complications.  U/S used in placement.  [name] 10/31/2014

## 2014-10-31 NOTE — Care Management Note (Addendum)
    Page 1 of 1   11/16/2014     4:24:48 PM CARE MANAGEMENT NOTE 11/16/2014  Patient:  [name], [name]   Account Number:  [id number]  Date Initiated:  10/31/2014  Documentation initiated by:  Elissa Hefty  Subjective/Objective Assessment:   adm w arrest-vent     Action/Plan:   lives w husband, pcp dr Delfino Lovett pang, esrd on hd, has home o2 per chart   Anticipated DC Date:  11/15/2014   Anticipated DC Plan:  [place]  CM consult      Choice offered to / List presented to:          [facility] arranged  [place] - 11 Patient Refused      Status of service:  Completed, signed off Medicare Important Message given?  YES (If response is "NO", the following Medicare IM given date fields will be blank) Date Medicare IM given:  11/09/2014 Medicare IM given by:  Elissa Hefty Date Additional Medicare IM given:  11/14/2014 Additional Medicare IM given by:  Ellan Lambert  Discharge Disposition:  [place]  Per UR Regulation:  Reviewed for med. necessity/level of care/duration of stay  If discussed at [place] of Stay Meetings, dates discussed:   11/08/2014  11/10/2014  11/15/2014    Comments:  11/15/14 Ellan Lambert, RN, BSN [phone] Met with pt and husband to discuss [facility] follow up at dc. They both politely refuse HH at this time.  Husband states he will contact PCP if [place] needed later at home, but they do not wish to set up at this time.   11/11/14 Ellan Lambert, RN, BSN [phone] Pt s/p cardiac arrest with CPR now with aspiration PNA.  PT recommending HHPT with 24h supervision at dc.  Will cont to follow for dc needs as pt progresses.

## 2014-10-31 NOTE — Code Documentation (Signed)
Critical care at bedside  

## 2014-10-31 NOTE — Progress Notes (Signed)
Bedside EEG completed w/ temp at 33. Results pending.

## 2014-10-31 NOTE — ED Provider Notes (Signed)
CPR Procedure Note I PERSONALLY DIRECTED ANCILLARY STAFF OR/PERFORMED CPR IN AN EFFORT TO REGAIN RETURN OF SPONTANEOUS CIRCULATION IN AN EFFORT TO MAINTAIN NEURO, CARDIAC AND SYSTEMIC PERFUSION  INTUBATION Performed by: Sharyon Cable  Required items: required devices, and special equipment available Patient identity confirmed: provided demographic data and hospital-assigned identification number Time out: deferred due to emergent need for airway management  Indications: respiratory failure  Intubation method: Glidescope Laryngoscopy   Preoxygenation: BVM  Sedatives: Etomidate Paralytic:   Tube Size: 7.5 cuffed  Post-procedure assessment: chest rise and ETCO2 monitor Breath sounds: equal and absent over the epigastrium Tube secured with: ETT holder   Patient tolerated the procedure well with no immediate complications.     Sharyon Cable, MD 10/31/14 1007

## 2014-10-31 NOTE — Progress Notes (Signed)
ANTIBIOTIC CONSULT NOTE - INITIAL  Pharmacy Consult for Unasyn Indication: aspiration PNA  Allergies  Allergen Reactions  . Ace Inhibitors Cough    Patient Measurements: Weight: 173 lb 4.5 oz (78.6 kg)   Vital Signs: Temp: 93.7 F (34.3 C) (01/25 1054) Temp Source: Core (Comment) (01/25 1054) BP: 99/44 mmHg (01/25 1049) Pulse Rate: 58 (01/25 1049) Intake/Output from previous day:   Intake/Output from this shift:    Labs:  Recent Labs  10/31/14 0942 10/31/14 1002  WBC  --  PENDING  HGB 13.6 9.3*  PLT  --  PENDING  CREATININE 9.30*  --    CrCl cannot be calculated (Unknown ideal weight.). No results for input(s): VANCOTROUGH, VANCOPEAK, VANCORANDOM, GENTTROUGH, GENTPEAK, GENTRANDOM, TOBRATROUGH, TOBRAPEAK, TOBRARND, AMIKACINPEAK, AMIKACINTROU, AMIKACIN in the last 72 hours.   Microbiology: No results found for this or any previous visit (from the past 720 hour(s)).  Medical History: Past Medical History  Diagnosis Date  . HTN (hypertension)     not well controlled  . 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 History of blood transfusion     "lots of times" (01/27/2013)  . CAP (community acquired pneumonia) 2010  . Type II diabetes mellitus   . Anemia   . Ovarian tumor, malignant 2005    resected in [place]' pt denies that this was cancer on 01/27/2013  . Diabetic retinopathy   . On home oxygen therapy     "5L; 24/7" (01/25/2014)  . ESRD on hemodialysis 08/25/2013    Started HD in 2008.  Had [place] transplant in 2010 at [facility].  Went back on HD in 2012.  Pancreas was removed surgically at [facility] for rejection w abcess formation.  The kidney was left in.  She now gets HD at [facility] on TTS schedule.     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  Both organs have since rejected, see "ESRD" for details.     Assessment: 70 YOF came in as  cardiac arrest- K was 8.1 in ER- got calcium chloride amp in ED. Underwent 1hr of CPR total per notes. Hx pancreas/kidney transplant in 2010- still ESRD on HD. Last HD session was 1/22.  Undergoing cooling protocol and cannot assess temperature accurately at this point. WBC pending.  Goal of Therapy:  proper dosing based on renal and hepatic function  Plan:  -Unasyn 1.5g IV q12h -follow HD sessions, clinical progression, c/s  Tal Kempker D. Coren Crownover, PharmD, BCPS Clinical Pharmacist Pager: [phone] 10/31/2014 11:14 AM

## 2014-10-31 NOTE — ED Notes (Signed)
Attempted to call report x 1  

## 2014-10-31 NOTE — ED Provider Notes (Signed)
CSN: [id number]     Arrival date & time 10/31/14  9147 History   First MD Initiated Contact with Patient 10/31/14 [phone]     Chief Complaint  Patient presents with  . Cardiac Arrest     (Consider location/radiation/quality/duration/timing/severity/associated sxs/prior Treatment) HPI 39 year old female with past medical history notable for ESRD on HD, DM, history of kidney and pancreas transplant, who presents to ED via EMS with CPR ongoing after patient was noted to become unresponsive at home. Per report, husband noted patient to "slump over" and pass out this morning. Prior to this there were no symptoms reported. Fire was first responder and pt found to have weak pulses and CPR continued. EMS arrives shortly thereafter and reports on arrival patient with strong pulses, rate 120, with a GCS less than 8 and they put in a [facility] airway. In route patient was noted to arrest and required CPR via the [place] system. Patient also was noted to have initial wide complex tachycardia requiring defibrillation 3. EMS gave total of 1 amp calcium, 1 amp bicarbonate, 3 epi's, 300 mg amio in route.  Patient last had dialysis this past Friday. No report of missed HD.  Past Medical History  Diagnosis Date  . HTN (hypertension)     not well controlled  . 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 History of blood transfusion     "lots of times" (01/27/2013)  . CAP (community acquired pneumonia) 2010  . Type II diabetes mellitus   . Anemia   . Ovarian tumor, malignant 2005    resected in [place]' pt denies that this was cancer on 01/27/2013  . Diabetic retinopathy   . On home oxygen therapy     "5L; 24/7" (01/25/2014)  . ESRD on hemodialysis 08/25/2013    Started HD in 2008.  Had [place] transplant in 2010 at [facility].  Went back on HD in 2012.  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  The kidney was left  in.  She now gets HD at [facility] on TTS schedule.     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  Both organs have since rejected, see "ESRD" for details.    Past Surgical History  Procedure Laterality Date  . Tumor removal  2005    Ovarian, resected in [place]  . Av fistula placement  06/03/2012    Procedure: INSERTION OF ARTERIOVENOUS (AV) GORE-TEX GRAFT ARM;  Surgeon: Rosetta Posner, MD;  Location: [place];  Service: Vascular;  Laterality: Left;  [name] Eye surgery Right     "cause of diabetes" (4/23/20140  . Combined kidney-pancreas transplant  2010    /notes 01/27/2013; @ [facility]  . 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 Renal biopsy  12/13/2010; 10/25/2011    Archie Endo 01/27/2013  . Tee without cardioversion N/A 04/22/2014    Procedure: TRANSESOPHAGEAL ECHOCARDIOGRAM (TEE);  Surgeon: Fay Records, MD;  Location: [facility] ENDOSCOPY;  Service: Cardiovascular;  Laterality: N/AHinton Dyer   Family History  Problem Relation Age of Onset  . Diabetes Mother   . Liver cancer Father   . Cancer Father    History  Substance Use Topics  . Smoking status: Never Smoker   . Smokeless tobacco: Never Used  . Alcohol Use: No  OB History    No data available     Review of Systems Unable to obtain   Allergies  Ace inhibitors  Home Medications   Prior to Admission medications   Medication Sig Start Date End Date Taking? Authorizing Provider  acetaminophen (TYLENOL) 500 MG tablet Take 1,000 mg by mouth every 6 (six) hours as needed for moderate pain.    Historical Provider, MD  amLODipine (NORVASC) 10 MG tablet Take 10 mg by mouth at bedtime.     Historical Provider, MD  aspirin EC 81 MG tablet Take 81 mg by mouth at bedtime.     Historical Provider, MD  calcium acetate (PHOSLO) 667 MG capsule Take 3 capsules (2,001 mg total) by mouth 3 (three) times daily with meals. 01/28/13   Myriam Jacobson,  PA-C  ceFAZolin (ANCEF) 2-3 GM-% SOLR Inject 50 mLs (2 g total) into the vein Every Tuesday,Thursday,and Saturday with dialysis. Through 8/2, then stop 04/23/14   Delfina Redwood, MD  cloNIDine (CATAPRES) 0.1 MG tablet Take 0.1 mg by mouth daily.     Historical Provider, MD  diphenhydrAMINE (BENADRYL) 25 mg capsule Take 25 mg by mouth daily as needed for allergies.    Historical Provider, MD  escitalopram (LEXAPRO) 20 MG tablet Take 10 mg by mouth daily.     Historical Provider, MD  insulin aspart (NOVOLOG) 100 UNIT/ML injection Inject 12 Units into the skin 2 (two) times daily with breakfast and lunch.     Historical Provider, MD  insulin glargine (LANTUS) 100 UNIT/ML injection Inject 0.5 mLs (50 Units total) into the skin at bedtime. 04/23/14   Delfina Redwood, MD  labetalol (NORMODYNE) 300 MG tablet Take 300 mg by mouth daily.     Historical Provider, MD  metoCLOPramide (REGLAN) 5 MG tablet Take 5 mg by mouth 4 (four) times daily.    Historical Provider, MD  simvastatin (ZOCOR) 20 MG tablet Take 20 mg by mouth every Monday, Wednesday, and Friday.    Historical Provider, MD   BP 239/75 mmHg  Pulse 79  Resp 19  SpO2 94% Physical Exam  Constitutional: She appears well-developed and well-nourished.  HENT:  Head: Normocephalic and atraumatic.  Nose: Nose normal.  Mouth/Throat: Oropharynx is clear and moist.  Eyes:  L pupil 4 mm and sluggish. R pupil 2 mm and sluggish.  Neck: Normal range of motion. No JVD present. No tracheal deviation present.  Cardiovascular:  Pulses:      Carotid pulses are 0 on the right side, and 0 on the left side.      Radial pulses are 0 on the right side, and 0 on the left side.       Femoral pulses are 0 on the right side, and 0 on the left side. On arrival, pt asystolic  Pulmonary/Chest: She has rhonchi (diffuse).  Abdominal: Soft. Normal appearance. She exhibits no distension and no mass. There is no tenderness.  Neurological: She is unresponsive. GCS  eye subscore is 1. GCS verbal subscore is 1. GCS motor subscore is 1.  Skin: Skin is warm and dry. She is not diaphoretic.  Nursing note and vitals reviewed.   ED Course  Procedures (including critical care time) Labs Review Labs Reviewed  CBC - Abnormal; Notable for the following:    WBC 35.7 (*)    RBC 3.08 (*)    Hemoglobin 9.3 (*)    HCT 28.6 (*)    RDW 16.1 (*)    All other components within normal  limits  APTT - Abnormal; Notable for the following:    aPTT 51 (*)    All other components within normal limits  BASIC METABOLIC PANEL - Abnormal; Notable for the following:    Sodium 129 (*)    Potassium >7.5 (*)    Chloride 91 (*)    Glucose, Bld 298 (*)    BUN 47 (*)    Creatinine, Ser 9.97 (*)    Calcium 11.1 (*)    GFR calc non Af Amer 4 (*)    GFR calc Af Amer 5 (*)    Anion gap 18 (*)    All other components within normal limits  PROTIME-INR - Abnormal; Notable for the following:    Prothrombin Time 18.6 (*)    INR 1.54 (*)    All other components within normal limits  APTT - Abnormal; Notable for the following:    aPTT 148 (*)    All other components within normal limits  I-STAT CHEM 8, ED - Abnormal; Notable for the following:    Sodium 125 (*)    Potassium 8.1 (*)    BUN 56 (*)    Creatinine, Ser 9.30 (*)    Glucose, Bld 308 (*)    Calcium, Ion 1.42 (*)    All other components within normal limits  I-STAT TROPOININ, ED - Abnormal; Notable for the following:    Troponin i, poc 0.23 (*)    All other components within normal limits  I-STAT ARTERIAL BLOOD GAS, ED - Abnormal; Notable for the following:    pH, Arterial 7.006 (*)    pCO2 arterial 77.6 (*)    pO2, Arterial 60.0 (*)    Bicarbonate 19.4 (*)    Acid-base deficit 12.0 (*)    All other components within normal limits  POCT I-STAT, CHEM 8 - Abnormal; Notable for the following:    Sodium 125 (*)    Potassium 7.5 (*)    BUN 44 (*)    Creatinine, Ser 9.10 (*)    Glucose, Bld 252 (*)    Calcium,  Ion 1.68 (*)    Hemoglobin 10.9 (*)    HCT 32.0 (*)    All other components within normal limits  POCT I-STAT 3, ART BLOOD GAS (G3+) - Abnormal; Notable for the following:    pH, Arterial 7.343 (*)    pO2, Arterial 52.0 (*)    All other components within normal limits  POCT I-STAT, CHEM 8 - Abnormal; Notable for the following:    Sodium 128 (*)    Potassium 7.6 (*)    BUN 44 (*)    Creatinine, Ser 9.50 (*)    Glucose, Bld 256 (*)    Calcium, Ion 1.74 (*)    Hemoglobin 10.9 (*)    HCT 32.0 (*)    All other components within normal limits  POCT I-STAT, CHEM 8 - Abnormal; Notable for the following:    BUN 25 (*)    Creatinine, Ser 5.10 (*)    Glucose, Bld 179 (*)    Calcium, Ion 1.33 (*)    Hemoglobin 9.5 (*)    HCT 28.0 (*)    All other components within normal limits  MRSA PCR SCREENING  PROTIME-INR  TROPONIN I  TROPONIN I  BASIC METABOLIC PANEL  BASIC METABOLIC PANEL  BASIC METABOLIC PANEL  BASIC METABOLIC PANEL  BASIC METABOLIC PANEL  PROTIME-INR  APTT  BASIC METABOLIC PANEL  BASIC METABOLIC PANEL  TROPONIN I  BASIC METABOLIC PANEL  I-STAT CG4  LACTIC ACID, ED    Imaging Review Dg Chest Port 1 View  10/31/2014   CLINICAL DATA:  OG tube placement.  EXAM: PORTABLE CHEST - 1 VIEW  COMPARISON:  One-view chest 04/18/2014.  FINDINGS: The patient has been intubated. The endotracheal tube terminates 4.9 cm above the carina. The heart is enlarged. Diffuse interstitial and airspace disease has progressed with more consolidative changes now on the right upper lobe and left lower lobe. The left hemi thorax is obscured by a to fibrillation pad. The orogastric tube courses off the inferior border of the film, within the stomach.  IMPRESSION: 1. Interval intubation. The endotracheal tube is in satisfactory position. 2. OG tube courses off the inferior border the film. 3. Cardiomegaly with increasing interstitial and airspace disease. This likely reflects a combination of edema and  atelectasis. Infection or aspiration is also considered.   Electronically Signed   By: Lawrence Santiago M.D.   On: 10/31/2014 10:21     EKG Interpretation   Date/Time:  Monday October 31 2014 09:54:22 EST Ventricular Rate:  88 PR Interval:    QRS Duration: 117 QT Interval:  325 QTC Calculation: 393 R Axis:   93 Text Interpretation:  Sinus rhythm LVH with secondary repolarization  abnormality ST depr, consider ischemia, inferior leads Confirmed by  Christy Gentles  MD, Elenore Rota ([id number]) on 10/31/2014 10:11:23 AM      MDM   Final diagnoses:  None    Patient arrives with CPR in progress with [facility] airway. At time of arrival to ED, total bystander and EMS CPR time approx 30 minutes. Initial sats of 50%. Patient is a known dialysis patient and was reported to pass out at home. On arrival, CPR continued & patient was given and pt was resuscitated per ACLS (see nursing documentation for exact timing and dosages). [place] airway was replaced with definitive ETT (see Dr. Christy Gentles documentation). Patient's potassium came back at 8, elevated Tn, lactic acidosis. Patient received multiple doses of calcium chloride, bicarb, epi. Patient received insulin and dextrose. ROSC was achieved briefly the patient lost pulses again. Patient was coded for approximately 30 minutes total when ROSC was achieved. Nephrology and critical care consulted. Patient was started on dopamine and central line was placed by critical care. Patient remained stable while in ED prior to transfer to ICU.  CRITICAL CARE Performed by: Dr. Hanley Ben, Dr. Christy Gentles  Total critical care time: 45  Critical care time was exclusive of separately billable procedures and treating other patients.  Critical care was necessary to treat or prevent imminent or life-threatening deterioration.  Critical care was time spent personally by me (& Dr. Christy Gentles) on the following activities: development of treatment plan with patient and/or surrogate as well as  nursing, discussions with consultants, evaluation of patient's response to treatment, examination of patient, obtaining history from patient or surrogate, ordering and performing treatments and interventions, ordering and review of laboratory studies, ordering and review of radiographic studies, pulse oximetry and re-evaluation of patient's condition.  Pt seen in conjunction with Dr. Wilber Oliphant, [place] Emergency [facility] Resident - PGY-2     Kirstie Peri, MD 10/31/14 [place], MD 11/01/14 [phone]

## 2014-10-31 NOTE — Progress Notes (Signed)
CRITICAL VALUE ALERT  Critical value received:  K   Date of notification:  10/31/2014  Time of notification:  1228  Critical value read back:Yes.    Nurse who received alert:  Renold Genta    MD notified (1st page):  CCM   Time of first page:    MD notified (2nd page):  Time of second page:  Responding MD:    Time MD responded:    Expected result, Dialysis ordered

## 2014-10-31 NOTE — Progress Notes (Signed)
ABG results given to Dr. Nelda Marseille.

## 2014-10-31 NOTE — Procedures (Signed)
I was present at this dialysis session, have reviewed the session itself and made  appropriate changes  Kelly Splinter MD (pgr) [phone]    ([phone] 10/31/2014, 2:25 PM

## 2014-10-31 NOTE — Progress Notes (Signed)
[facility] Progress Note Patient Name: [name] DOB: 1976-06-26 MRN: [id number]   Date of Service  10/31/2014  HPI/Events of Note    eICU Interventions  protonix ordered for SUP     Intervention Category Intermediate Interventions: Best-practice therapies (e.g. DVT, beta blocker, etc.)  Briyan Kleven S. 10/31/2014, 8:36 PM

## 2014-10-31 NOTE — ED Notes (Signed)
Pulses returned. CPR stopped.

## 2014-10-31 NOTE — Consult Note (Addendum)
Renal Service Consult Note [facility]  [name] 10/31/2014 [name] Requesting Physician:  Dr Nelda Marseille  Reason for Consult:  ESRD patient with cardiac arrest and severe hyperkalemia HPI: The patient is a 39 y.o. year-old with hx of HTN, DM2, ESRD on HD. LOC episode at home this am, husband started compressions, EMS arrived and she had pulses but did arrest in transport with "VT" and was resuscitated. Brought to ED and intubatedl. Wide complex on arrival and serum K >8.  Has rec'd Rx for hyperkalemia with IV Ca, insulin, glucose.  QRS has narrowed down to about 110 msec.  Asked to see for dialysis.  Past Medical History  Past Medical History  Diagnosis Date  . HTN (hypertension)     not well controlled  . 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 History of blood transfusion     "lots of times" (01/27/2013)  . CAP (community acquired pneumonia) 2010  . Type II diabetes mellitus   . Anemia   . Ovarian tumor, malignant 2005    resected in [place]' pt denies that this was cancer on 01/27/2013  . Diabetic retinopathy   . On home oxygen therapy     "5L; 24/7" (01/25/2014)  . ESRD on hemodialysis 08/25/2013    Started HD in 2008.  Had [place] transplant in 2010 at [facility].  Went back on HD in 2012.  Pancreas was removed surgically at [facility] for rejection w abcess formation.  The kidney was left in.  She now gets HD at [facility] on TTS schedule.     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  Both organs have since rejected, see "ESRD" for details.    Past Surgical History  Past Surgical History  Procedure Laterality Date  . Tumor removal  2005    Ovarian, resected in [place]  . Av fistula placement  06/03/2012    Procedure: INSERTION OF ARTERIOVENOUS (AV) GORE-TEX GRAFT ARM;  Surgeon: Rosetta Posner, MD;  Location: [place];  Service: Vascular;  Laterality:  Left;  [name] Eye surgery Right     "cause of diabetes" (4/23/20140  . Combined kidney-pancreas transplant  2010    /notes 01/27/2013; @ [facility]  . 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 Renal biopsy  12/13/2010; 10/25/2011    Archie Endo 01/27/2013  . Tee without cardioversion N/A 04/22/2014    Procedure: TRANSESOPHAGEAL ECHOCARDIOGRAM (TEE);  Surgeon: Fay Records, MD;  Location: [facility] ENDOSCOPY;  Service: Cardiovascular;  Laterality: N/AHinton Dyer   Family History  Family History  Problem Relation Age of Onset  . Diabetes Mother   . Liver cancer Father   . Cancer Father    Social History  reports that she has never smoked. She has never used smokeless tobacco. She reports that she does not drink alcohol or use illicit drugs. Allergies  Allergies  Allergen Reactions  . Ace Inhibitors Cough   Home medications Prior to Admission medications   Medication Sig Start Date End Date Taking? Authorizing Provider  acetaminophen (TYLENOL) 500 MG tablet Take 1,000 mg by mouth every 6 (six) hours as needed for moderate pain.    Historical Provider, MD  amLODipine (NORVASC) 10 MG tablet Take 10 mg by mouth at bedtime.     Historical Provider, MD  aspirin EC 81 MG tablet Take 81 mg by mouth at bedtime.     Historical Provider, MD  calcium acetate (PHOSLO) 667 MG capsule Take 3 capsules (2,001 mg total) by mouth 3 (three) times daily with meals. 01/28/13   Myriam Jacobson, PA-C  ceFAZolin (ANCEF) 2-3 GM-% SOLR Inject 50 mLs (2 g total) into the vein Every Tuesday,Thursday,and Saturday with dialysis. Through 8/2, then stop 04/23/14   Delfina Redwood, MD  cloNIDine (CATAPRES) 0.1 MG tablet Take 0.1 mg by mouth daily.     Historical Provider, MD  diphenhydrAMINE (BENADRYL) 25 mg capsule Take 25 mg by mouth daily as needed for allergies.    Historical Provider, MD  escitalopram (LEXAPRO) 20 MG tablet Take 10 mg by mouth daily.     Historical  Provider, MD  insulin aspart (NOVOLOG) 100 UNIT/ML injection Inject 12 Units into the skin 2 (two) times daily with breakfast and lunch.     Historical Provider, MD  insulin glargine (LANTUS) 100 UNIT/ML injection Inject 0.5 mLs (50 Units total) into the skin at bedtime. 04/23/14   Delfina Redwood, MD  labetalol (NORMODYNE) 300 MG tablet Take 300 mg by mouth daily.     Historical Provider, MD  metoCLOPramide (REGLAN) 5 MG tablet Take 5 mg by mouth 4 (four) times daily.    Historical Provider, MD  simvastatin (ZOCOR) 20 MG tablet Take 20 mg by mouth every Monday, Wednesday, and Friday.    Historical Provider, MD   Liver Function Tests No results for input(s): AST, ALT, ALKPHOS, BILITOT, PROT, ALBUMIN in the last 168 hours. No results for input(s): LIPASE, AMYLASE in the last 168 hours. CBC  Recent Labs Lab 10/31/14 0942 10/31/14 1002  WBC  --  PENDING  HGB 13.6 9.3*  HCT 40.0 28.6*  MCV  --  92.9  PLT  --  PENDING   Basic Metabolic Panel  Recent Labs Lab 10/31/14 0942  NA 125*  K 8.1*  CL 99  GLUCOSE 308*  BUN 56*  CREATININE 9.30*    Filed Vitals:   10/31/14 1039 10/31/14 1045 10/31/14 1049 10/31/14 1054  BP:  99/44 99/44   Pulse: 38 52 58   Temp:    93.7 F (34.3 C)  TempSrc:    Core (Comment)  Resp: 23 24    Weight: 78.6 kg (173 lb 4.5 oz)     SpO2: 78% 68%     Exam Intubated No rash, cyanosis or gangrene Sclera anicteric, throat w ETT in place No jvd Chest coarse BS bilat RRR tachy no MRG Abd soft, NTND No LE or UE edema Neuro is sedated on vent LUA AVG +bruit  HD: TTS [place] 4h   72.5kg   2/2.25 [place]   400/A1.5    LUA AVG   Heparin 2400 Aranesp 200 / wk,  Hect 5 ug, Venofer 100/ hd thru 1/28   Assessment: 1. Cardiac arrest 2. Severe hyperkalemia w wide QRS 3. ESRD on HD 4. Anemia on max esa 5. HTN /vol 6. Resp - marked bilat infiltrates on CXR, poss aspiration 7. Hx failed KP transplant 8. DM2    Plan- HD in ICU asap, should be able  to use AVF as BP is good and pt will be heavily sedated  Kelly Splinter MD (pgr) [phone]    [phone] 10/31/2014, 11:05 AM

## 2014-10-31 NOTE — ED Notes (Signed)
Per EMS: Pt from home with husband. At [place] AM husband reports pt was sitting in chair and "she slumped over." Husband started compressions. On [place] arrival pt noted to have weak pulses and compressions continued. EMS arrived at 0830 and pt had strong pulses, rate 120. On transfer to EMS truck pt lost pulses, compressions resumed. During tranport, pt noted to go into V Tach, pt shocked three times. Pt given 1 amp Calcium, 3 epi's, 300 amiodarone and 1 amp of Bi Carb. On arrival to ED, pt has [facility] Airway. CPR continuing.

## 2014-10-31 NOTE — Progress Notes (Signed)
Dr. Alva Garnet notified of tachycardia 120s, with dopamine at 26mcg/kg/min and levophed at 28 mcg/min.  Oxygen saturation has improved to 92% after increased peep.  No new orders received.  Will continue to monitor pt closely.

## 2014-10-31 NOTE — Progress Notes (Signed)
CRITICAL VALUE ALERT  Critical value received:  Troponin 2.0  Date of notification:  10/31/2014  Time of notification:  2008   Critical value read back:Yes.    Nurse who received alert:  Hadassah Pais RN  MD notified (1st page):  Expected value  Time of first page:    MD notified (2nd page):  Time of second page:  Responding MD:    Time MD responded:

## 2014-10-31 NOTE — Progress Notes (Signed)
Responded to request from charge nurse to locate patient husband staff becoming concern that no one had arrived.  I located husband and escorted him to speak with Dr. Jonnie Finner and later to 0E02.  Per EMS: Pt from home with husband. At [place] AM husband reports pt was sitting in chair and "she slumped over." Husband started compressions. Provided emotional support, listening and ministry of presence to husband.  Support staff and facilitated information sharing between staff and family.  Will pass on to unit chaplain for continued support as needed.   10/31/14 1100  Clinical Encounter Type  Visited With Family;Patient not available;Health care provider  Visit Type Initial;Spiritual support;Critical Care;ED;Trauma  Referral From Nurse  Spiritual Encounters  Spiritual Needs Emotional  Stress Factors  Family Stress Factors Family relationships;Health changes  Jaclynn Major, [place]

## 2014-10-31 NOTE — Progress Notes (Signed)
[facility] Progress Note Patient Name: [name] DOB: 1976/03/07 MRN: [id number]   Date of Service  10/31/2014  HPI/Events of Note    eICU Interventions  ICU HG protocol started     Intervention Category Intermediate Interventions: Hyperglycemia - evaluation and treatment  BYRUM,ROBERT S. 10/31/2014, 5:11 PM

## 2014-10-31 NOTE — ED Provider Notes (Signed)
Consult called to both critical care (dr Nelda Marseille) and nephrology (schertz)  Sharyon Cable, MD 10/31/14 1011

## 2014-10-31 NOTE — Procedures (Signed)
ELECTROENCEPHALOGRAM REPORT  Patient: [name]       Room #: 5Y85 EEG No. ID: [id number] Age: 39 y.o.        Sex: female Referring Physician: Fatima Blank Report Date:  10/31/2014        Interpreting Physician: Anthony Sar  History: [name] is an 39 y.o. female history of hypertension, end-stage renal disease, diabetes mellitus, transplant in 2002, admitted following acute cardiac arrest with one hour of resuscitation time. Patient has remained unresponsive and is currently undergoing hypothermia treatment.  Indications for study:  Assess severity of encephalopathy; rule out seizure activity.  Technique: This is an 18 channel routine scalp EEG performed at the bedside with bipolar and monopolar montages arranged in accordance to the international 10/20 system of electrode placement.   Description: Patient was noted to be hypothermic at the time of this recording. Only minimal brain activity very low amplitude was intermittently recorded with a pattern indicative of burst-suppression. No epileptiform activity was recorded.   Interpretation: This EEG is abnormal with marked suppression of brain activity which is likely due in part to anoxic brain injury, as well as hypothermic state. Severity of anoxic encephalopathy cannot be determined at this point. Repeat study following return to normothermic state would be more useful in assessing severity of injury and clinical prognosis.   Rush Farmer M.D. [facility] [phone]

## 2014-11-01 ENCOUNTER — Inpatient Hospital Stay (HOSPITAL_COMMUNITY): Payer: Medicare Other

## 2014-11-01 DIAGNOSIS — G931 Anoxic brain damage, not elsewhere classified: Secondary | ICD-10-CM

## 2014-11-01 DIAGNOSIS — J9601 Acute respiratory failure with hypoxia: Secondary | ICD-10-CM

## 2014-11-01 DIAGNOSIS — E875 Hyperkalemia: Secondary | ICD-10-CM | POA: Insufficient documentation

## 2014-11-01 DIAGNOSIS — R072 Precordial pain: Secondary | ICD-10-CM

## 2014-11-01 DIAGNOSIS — J9600 Acute respiratory failure, unspecified whether with hypoxia or hypercapnia: Secondary | ICD-10-CM | POA: Insufficient documentation

## 2014-11-01 LAB — BASIC METABOLIC PANEL
ANION GAP: 15 (ref 5–15)
Anion gap: 13 (ref 5–15)
Anion gap: 15 (ref 5–15)
Anion gap: 14 (ref 5–15)
Anion gap: 10 (ref 5–15)
BUN: 26 mg/dL — AB (ref 6–23)
BUN: 27 mg/dL — ABNORMAL HIGH (ref 6–23)
BUN: 29 mg/dL — AB (ref 6–23)
BUN: 29 mg/dL — ABNORMAL HIGH (ref 6–23)
BUN: 25 mg/dL — ABNORMAL HIGH (ref 6–23)
CALCIUM: 9.3 mg/dL (ref 8.4–10.5)
CHLORIDE: 95 mmol/L — AB (ref 96–112)
CHLORIDE: 92 mmol/L — AB (ref 96–112)
CHLORIDE: 94 mmol/L — AB (ref 96–112)
CO2: 26 mmol/L (ref 19–32)
CO2: 27 mmol/L (ref 19–32)
CO2: 26 mmol/L (ref 19–32)
CO2: 26 mmol/L (ref 19–32)
CO2: 28 mmol/L (ref 19–32)
CREATININE: 6.43 mg/dL — AB (ref 0.50–1.10)
CREATININE: 5.84 mg/dL — AB (ref 0.50–1.10)
CREATININE: 5.15 mg/dL — AB (ref 0.50–1.10)
Calcium: 9.5 mg/dL (ref 8.4–10.5)
Calcium: 8.7 mg/dL (ref 8.4–10.5)
Calcium: 8.1 mg/dL — ABNORMAL LOW (ref 8.4–10.5)
Calcium: 8.1 mg/dL — ABNORMAL LOW (ref 8.4–10.5)
Chloride: 93 mmol/L — ABNORMAL LOW (ref 96–112)
Chloride: 94 mmol/L — ABNORMAL LOW (ref 96–112)
Creatinine, Ser: 6.42 mg/dL — ABNORMAL HIGH (ref 0.50–1.10)
Creatinine, Ser: 6.28 mg/dL — ABNORMAL HIGH (ref 0.50–1.10)
GFR calc Af Amer: 9 mL/min — ABNORMAL LOW (ref >90)
GFR calc Af Amer: 9 mL/min — ABNORMAL LOW (ref >90)
GFR calc Af Amer: 11 mL/min — ABNORMAL LOW (ref >90)
GFR calc non Af Amer: 7 mL/min — ABNORMAL LOW (ref >90)
GFR calc non Af Amer: 8 mL/min — ABNORMAL LOW (ref >90)
GFR calc non Af Amer: 7 mL/min — ABNORMAL LOW (ref >90)
GFR calc non Af Amer: 8 mL/min — ABNORMAL LOW (ref >90)
GFR, EST AFRICAN AMERICAN: 9 mL/min — AB (ref >90)
GFR, EST AFRICAN AMERICAN: 10 mL/min — AB (ref >90)
GFR, EST NON AFRICAN AMERICAN: 10 mL/min — AB (ref >90)
GLUCOSE: 214 mg/dL — AB (ref 70–99)
GLUCOSE: 146 mg/dL — AB (ref 70–99)
Glucose, Bld: 165 mg/dL — ABNORMAL HIGH (ref 70–99)
Glucose, Bld: 158 mg/dL — ABNORMAL HIGH (ref 70–99)
Glucose, Bld: 137 mg/dL — ABNORMAL HIGH (ref 70–99)
POTASSIUM: 3.8 mmol/L (ref 3.5–5.1)
POTASSIUM: 4.7 mmol/L (ref 3.5–5.1)
POTASSIUM: 6.7 mmol/L — AB (ref 3.5–5.1)
Potassium: 3.9 mmol/L (ref 3.5–5.1)
Potassium: 6.2 mmol/L (ref 3.5–5.1)
SODIUM: 134 mmol/L — AB (ref 135–145)
SODIUM: 135 mmol/L (ref 135–145)
Sodium: 135 mmol/L (ref 135–145)
Sodium: 132 mmol/L — ABNORMAL LOW (ref 135–145)
Sodium: 132 mmol/L — ABNORMAL LOW (ref 135–145)

## 2014-11-01 LAB — RENAL FUNCTION PANEL
ALBUMIN: 2.6 g/dL — AB (ref 3.5–5.2)
ANION GAP: 8 (ref 5–15)
BUN: 30 mg/dL — ABNORMAL HIGH (ref 6–23)
CO2: 31 mmol/L (ref 19–32)
Calcium: 8.2 mg/dL — ABNORMAL LOW (ref 8.4–10.5)
Chloride: 95 mmol/L — ABNORMAL LOW (ref 96–112)
Creatinine, Ser: 5.82 mg/dL — ABNORMAL HIGH (ref 0.50–1.10)
GFR calc Af Amer: 10 mL/min — ABNORMAL LOW (ref >90)
GFR calc non Af Amer: 8 mL/min — ABNORMAL LOW (ref >90)
Glucose, Bld: 158 mg/dL — ABNORMAL HIGH (ref 70–99)
PHOSPHORUS: 7.1 mg/dL — AB (ref 2.3–4.6)
POTASSIUM: 6.7 mmol/L — AB (ref 3.5–5.1)
SODIUM: 134 mmol/L — AB (ref 135–145)

## 2014-11-01 LAB — POCT I-STAT 3, ART BLOOD GAS (G3+)
ACID-BASE EXCESS: 4 mmol/L — AB (ref 0.0–2.0)
ACID-BASE EXCESS: 3 mmol/L — AB (ref 0.0–2.0)
Bicarbonate: 26.2 mEq/L — ABNORMAL HIGH (ref 20.0–24.0)
Bicarbonate: 30.4 mEq/L — ABNORMAL HIGH (ref 20.0–24.0)
O2 Saturation: 97 %
O2 Saturation: 95 %
PCO2 ART: 25.1 mmHg — AB (ref 35.0–45.0)
PH ART: 7.36 (ref 7.350–7.450)
PO2 ART: 66 mmHg — AB (ref 80.0–100.0)
Patient temperature: 31.7
TCO2: 27 mmol/L (ref 0–100)
TCO2: 32 mmol/L (ref 0–100)
pCO2 arterial: 51.6 mmHg — ABNORMAL HIGH (ref 35.0–45.0)
pH, Arterial: 7.608 (ref 7.350–7.450)
pO2, Arterial: 56 mmHg — ABNORMAL LOW (ref 80.0–100.0)

## 2014-11-01 LAB — POCT I-STAT, CHEM 8
BUN: 29 mg/dL — ABNORMAL HIGH (ref 6–23)
BUN: 28 mg/dL — ABNORMAL HIGH (ref 6–23)
BUN: 24 mg/dL — AB (ref 6–23)
CHLORIDE: 95 mmol/L — AB (ref 96–112)
CREATININE: 4.4 mg/dL — AB (ref 0.50–1.10)
Calcium, Ion: 1.15 mmol/L (ref 1.12–1.23)
Calcium, Ion: 1.18 mmol/L (ref 1.12–1.23)
Calcium, Ion: 1.09 mmol/L — ABNORMAL LOW (ref 1.12–1.23)
Chloride: 95 mmol/L — ABNORMAL LOW (ref 96–112)
Chloride: 99 mmol/L (ref 96–112)
Creatinine, Ser: 5.6 mg/dL — ABNORMAL HIGH (ref 0.50–1.10)
Creatinine, Ser: 4.8 mg/dL — ABNORMAL HIGH (ref 0.50–1.10)
GLUCOSE: 135 mg/dL — AB (ref 70–99)
Glucose, Bld: 169 mg/dL — ABNORMAL HIGH (ref 70–99)
Glucose, Bld: 149 mg/dL — ABNORMAL HIGH (ref 70–99)
HCT: 29 % — ABNORMAL LOW (ref 36.0–46.0)
HCT: 30 % — ABNORMAL LOW (ref 36.0–46.0)
HCT: 29 % — ABNORMAL LOW (ref 36.0–46.0)
Hemoglobin: 9.9 g/dL — ABNORMAL LOW (ref 12.0–15.0)
Hemoglobin: 10.2 g/dL — ABNORMAL LOW (ref 12.0–15.0)
Hemoglobin: 9.9 g/dL — ABNORMAL LOW (ref 12.0–15.0)
POTASSIUM: 6.4 mmol/L — AB (ref 3.5–5.1)
POTASSIUM: 6.6 mmol/L — AB (ref 3.5–5.1)
Potassium: 5.9 mmol/L — ABNORMAL HIGH (ref 3.5–5.1)
SODIUM: 132 mmol/L — AB (ref 135–145)
SODIUM: 133 mmol/L — AB (ref 135–145)
Sodium: 132 mmol/L — ABNORMAL LOW (ref 135–145)
TCO2: 26 mmol/L (ref 0–100)
TCO2: 27 mmol/L (ref 0–100)
TCO2: 27 mmol/L (ref 0–100)

## 2014-11-01 LAB — GLUCOSE, CAPILLARY
GLUCOSE-CAPILLARY: 370 mg/dL — AB (ref 70–99)
GLUCOSE-CAPILLARY: 336 mg/dL — AB (ref 70–99)
GLUCOSE-CAPILLARY: 190 mg/dL — AB (ref 70–99)
GLUCOSE-CAPILLARY: 169 mg/dL — AB (ref 70–99)
GLUCOSE-CAPILLARY: 158 mg/dL — AB (ref 70–99)
GLUCOSE-CAPILLARY: 132 mg/dL — AB (ref 70–99)
Glucose-Capillary: 194 mg/dL — ABNORMAL HIGH (ref 70–99)
Glucose-Capillary: 251 mg/dL — ABNORMAL HIGH (ref 70–99)
Glucose-Capillary: 296 mg/dL — ABNORMAL HIGH (ref 70–99)
Glucose-Capillary: 340 mg/dL — ABNORMAL HIGH (ref 70–99)
Glucose-Capillary: 368 mg/dL — ABNORMAL HIGH (ref 70–99)
Glucose-Capillary: 129 mg/dL — ABNORMAL HIGH (ref 70–99)
Glucose-Capillary: 149 mg/dL — ABNORMAL HIGH (ref 70–99)
Glucose-Capillary: 149 mg/dL — ABNORMAL HIGH (ref 70–99)
Glucose-Capillary: 157 mg/dL — ABNORMAL HIGH (ref 70–99)
Glucose-Capillary: 91 mg/dL (ref 70–99)
Glucose-Capillary: 139 mg/dL — ABNORMAL HIGH (ref 70–99)

## 2014-11-01 LAB — PROCALCITONIN: Procalcitonin: 160.15 ng/mL

## 2014-11-01 LAB — MAGNESIUM: MAGNESIUM: 1.7 mg/dL (ref 1.5–2.5)

## 2014-11-01 LAB — CG4 I-STAT (LACTIC ACID): Lactic Acid, Venous: 6.53 mmol/L (ref 0.5–2.0)

## 2014-11-01 LAB — LACTIC ACID, PLASMA: Lactic Acid, Venous: 3.4 mmol/L (ref 0.5–2.0)

## 2014-11-01 LAB — TROPONIN I: Troponin I: 2.31 ng/mL (ref <0.031)

## 2014-11-01 MED ORDER — SODIUM CHLORIDE 0.9 % FOR CRRT
INTRAVENOUS_CENTRAL | Status: DC | PRN
  Filled 2014-11-01: qty 1000

## 2014-11-01 MED ORDER — SODIUM CHLORIDE 0.9 % IV SOLN
INTRAVENOUS | Status: DC
  Administered 2014-11-01: 06:00:00 via INTRAVENOUS

## 2014-11-01 MED ORDER — INSULIN GLARGINE 100 UNIT/ML ~~LOC~~ SOLN
15.0000 [IU] | SUBCUTANEOUS | Status: DC
  Administered 2014-11-01 – 2014-11-07 (×7): 15 [IU] via SUBCUTANEOUS
  Filled 2014-11-01 (×10): qty 0.15

## 2014-11-01 MED ORDER — PRISMASOL BGK 4/2.5 32-4-2.5 MEQ/L IV SOLN
INTRAVENOUS | Status: DC
  Administered 2014-11-01: 18:00:00 via INTRAVENOUS_CENTRAL
  Filled 2014-11-01 (×7): qty 5000

## 2014-11-01 MED ORDER — PRISMASOL BGK 0/2.5 32-2.5 MEQ/L IV SOLN
INTRAVENOUS | Status: DC
  Administered 2014-11-01: 21:00:00 via INTRAVENOUS_CENTRAL
  Filled 2014-11-01: qty 5000

## 2014-11-01 MED ORDER — PRISMASOL BGK 4/2.5 32-4-2.5 MEQ/L IV SOLN
INTRAVENOUS | Status: DC
  Administered 2014-11-01: 18:00:00 via INTRAVENOUS_CENTRAL
  Filled 2014-11-01: qty 5000

## 2014-11-01 MED ORDER — PIPERACILLIN-TAZOBACTAM IN DEX 2-0.25 GM/50ML IV SOLN
2.2500 g | Freq: Four times a day (QID) | INTRAVENOUS | Status: DC
  Administered 2014-11-01 – 2014-11-03 (×7): 2.25 g via INTRAVENOUS
  Filled 2014-11-01 (×9): qty 50

## 2014-11-01 MED ORDER — VANCOMYCIN HCL 10 G IV SOLR
1750.0000 mg | Freq: Once | INTRAVENOUS | Status: AC
  Administered 2014-11-01: 1750 mg via INTRAVENOUS
  Filled 2014-11-01: qty 1750

## 2014-11-01 MED ORDER — INSULIN ASPART 100 UNIT/ML ~~LOC~~ SOLN
1.0000 [IU] | SUBCUTANEOUS | Status: DC
  Administered 2014-11-01: 1 [IU] via SUBCUTANEOUS
  Administered 2014-11-01: 2 [IU] via SUBCUTANEOUS
  Administered 2014-11-01 – 2014-11-04 (×11): 1 [IU] via SUBCUTANEOUS
  Administered 2014-11-05 (×2): 2 [IU] via SUBCUTANEOUS
  Administered 2014-11-05 (×3): 1 [IU] via SUBCUTANEOUS
  Administered 2014-11-05 – 2014-11-06 (×6): 2 [IU] via SUBCUTANEOUS
  Administered 2014-11-07 (×3): 3 [IU] via SUBCUTANEOUS
  Administered 2014-11-07 (×2): 2 [IU] via SUBCUTANEOUS

## 2014-11-01 MED ORDER — VANCOMYCIN HCL IN DEXTROSE 750-5 MG/150ML-% IV SOLN
750.0000 mg | INTRAVENOUS | Status: DC
  Administered 2014-11-02: 750 mg via INTRAVENOUS
  Filled 2014-11-01 (×2): qty 150

## 2014-11-01 MED ORDER — PRISMASOL BGK 0/2.5 32-2.5 MEQ/L IV SOLN
INTRAVENOUS | Status: DC
  Administered 2014-11-01 – 2014-11-02 (×4): via INTRAVENOUS_CENTRAL
  Filled 2014-11-01 (×7): qty 5000

## 2014-11-01 MED ORDER — ALTEPLASE 2 MG IJ SOLR
2.0000 mg | Freq: Once | INTRAMUSCULAR | Status: AC | PRN
  Filled 2014-11-01: qty 2

## 2014-11-01 MED ORDER — DEXTROSE 10 % IV SOLN: INTRAVENOUS | Status: DC | PRN

## 2014-11-01 MED ORDER — HEPARIN SODIUM (PORCINE) 1000 UNIT/ML DIALYSIS
1000.0000 [IU] | INTRAMUSCULAR | Status: DC | PRN
  Filled 2014-11-01 (×2): qty 3

## 2014-11-01 MED ORDER — PRISMASOL BGK 4/2.5 32-4-2.5 MEQ/L IV SOLN
INTRAVENOUS | Status: DC
  Administered 2014-11-01: 18:00:00 via INTRAVENOUS_CENTRAL
  Filled 2014-11-01 (×2): qty 5000

## 2014-11-01 MED ORDER — PIPERACILLIN-TAZOBACTAM IN DEX 2-0.25 GM/50ML IV SOLN
2.2500 g | Freq: Four times a day (QID) | INTRAVENOUS | Status: DC
  Filled 2014-11-01 (×3): qty 50

## 2014-11-01 MED ORDER — PIPERACILLIN-TAZOBACTAM IN DEX 2-0.25 GM/50ML IV SOLN
2.2500 g | Freq: Three times a day (TID) | INTRAVENOUS | Status: DC
  Filled 2014-11-01 (×2): qty 50

## 2014-11-01 MED ORDER — PRISMASOL BGK 0/2.5 32-2.5 MEQ/L IV SOLN
INTRAVENOUS | Status: DC
  Administered 2014-11-01: 21:00:00 via INTRAVENOUS_CENTRAL
  Filled 2014-11-01 (×2): qty 5000

## 2014-11-01 NOTE — Progress Notes (Signed)
  [facility] Progress Note   Subjective: Wt up 82kg , was on pressors, now mostly off  Filed Vitals:   11/01/14 1130 11/01/14 1145 11/01/14 1200 11/01/14 1202  BP:    119/63  Pulse: 75 76 79 80  Temp:   91.6 F (33.1 C)   TempSrc:   Core (Comment)   Resp: 14 14 14 14   Height:      Weight:      SpO2: 100% 100% 99% 100%   Exam: Intubated, w ETT in place No jvd Chest coarse BS bilat RRR tachy no MRG Abd soft, NTND Diffuse 1-2+ edema Neuro is sedated on vent LUA AVG +bruit  HD: TTS [place] 4h 72.5kg 2/2.25 Bath 400/A1.5 LUA AVG Heparin 2400 Aranesp 200 / wk, Hect 5 ug, Venofer 100/ hd thru 1/28   Assessment: 1. Cardiac arrest- on cooling protocol 2. Severe hyperkalemia - resolved 3. ESRD on HD TTS 4. Vol excess - new edema on CXR 5. Anemia on max esa 6. HTN /vol 7. VDRF  8. Hx failed KP transplant  Plan - will need CRRT now, UF 50 -100 / hr    Kelly Splinter MD  pager [phone]    cell [phone]  11/01/2014, 12:42 PM     Recent Labs Lab 11/01/14 0211 11/01/14 0605 11/01/14 1000  NA 134* 135 132*  K 3.9 3.8 4.7  CL 95* 93* 92*  CO2 26 27 26   GLUCOSE 214* 146* 165*  BUN 26* 27* 29*  CREATININE 6.42* 6.28* 6.43*  CALCIUM 9.5 9.3 8.7   No results for input(s): AST, ALT, ALKPHOS, BILITOT, PROT, ALBUMIN in the last 168 hours.  Recent Labs Lab 10/31/14 1002  10/31/14 1324 10/31/14 1420 10/31/14 1529  WBC 35.7*  --   --   --   --   HGB 9.3*  < > 10.9* 9.5* 13.3  HCT 28.6*  < > 32.0* 28.0* 39.0  MCV 92.9  --   --   --   --   PLT PLATELET CLUMPS NOTED ON SMEAR, COUNT APPEARS ADEQUATE  --   --   --   --   < > = values in this interval not displayed. [name] antiseptic oral rinse  7 mL Mouth Rinse QID  . artificial tears  1 application Both Eyes 3 times per day  . chlorhexidine  15 mL Mouth Rinse BID  . insulin aspart  1-3 Units Subcutaneous 6 times per day  . insulin glargine  15 Units Subcutaneous Q24H  . pantoprazole  (PROTONIX) IV  40 mg Intravenous Q24H  . piperacillin-tazobactam (ZOSYN)  IV  2.25 g Intravenous Q8H  . vancomycin  1,750 mg Intravenous Once   . sodium chloride Stopped (11/01/14 0800)  . sodium chloride Stopped (11/01/14 0800)  . cisatracurium (NIMBEX) infusion 1 mcg/kg/min (11/01/14 0800)  . dextrose    . DOPamine Stopped (10/31/14 2212)  . fentaNYL infusion INTRAVENOUS 200 mcg/hr (11/01/14 0805)  . midazolam (VERSED) infusion 3 mg/hr (11/01/14 0726)  . norepinephrine (LEVOPHED) Adult infusion 10 mcg/min (11/01/14 1100)  . vasopressin (PITRESSIN) infusion - *FOR SHOCK* 0.03 Units/min (11/01/14 0800)   sodium chloride, sodium chloride, [COMPLETED] cisatracurium **AND** cisatracurium (NIMBEX) infusion **AND** cisatracurium, dextrose, feeding supplement (NEPRO CARB STEADY), fentaNYL, heparin, heparin, lidocaine (PF), lidocaine-prilocaine, midazolam, pentafluoroprop-tetrafluoroeth

## 2014-11-01 NOTE — Progress Notes (Signed)
Echocardiogram 2D Echocardiogram has been performed.  Tauren Delbuono 11/01/2014, 10:14 AM

## 2014-11-01 NOTE — Progress Notes (Signed)
PULMONARY / CRITICAL CARE MEDICINE   Name: [name] MRN: [id number] DOB: 1976-01-08    ADMISSION DATE:  10/31/2014  REFERRING MD :  EDP  CHIEF COMPLAINT:  Cardiac arrest   INITIAL PRESENTATION: 39 yo female with extensive PMH including HTN, ESRD on HD, previous TB, DM, kidney-pancreas transplant 2010, on home O2 with witnessed arrest at home.  Had her HD as regularly scheduled on 1/22.  Husband performed CPR.  Wide complex rhythm on EMS arrival.  Total ~1 hour CPR during multiple episodes, 7 epi, 6 defib.  K+ in ER was 8.1.  PCCM called to admit.   STUDIES:  CT head 1/25: ?acute sinusitis pCXR 1/25: cardiomegaly, increasing interstitial and airspace disease EEG 1/25: Anoxic encephalopathy Echo 1/26>>>  SIGNIFICANT EVENTS: 1/26: admit s/p cardiac arrest, HD, hypothermia protocol, hypotensive during HD  SUBJECTIVE: Lying in bed, sedated, paralyzed, on mechanical ventilation, cooling  VITAL SIGNS: Temp:  [89.2 F (31.8 C)-94.3 F (34.6 C)] 92.1 F (33.4 C) (01/26 0700) Pulse Rate:  [0-128] 66 (01/26 0730) Resp:  [17-33] 30 (01/26 0730) BP: (76-239)/(33-183) 118/67 mmHg (01/25 1659) SpO2:  [56 %-100 %] 85 % (01/26 0730) Arterial Line BP: (88-197)/(51-105) 116/66 mmHg (01/26 0730) FiO2 (%):  [60 %-100 %] 60 % (01/26 0304) Weight:  [173 lb 4.5 oz (78.6 kg)-181 lb 14.1 oz (82.5 kg)] 181 lb 14.1 oz (82.5 kg) (01/25 1120) HEMODYNAMICS: CVP:  [5 mmHg-7 mmHg] 5 mmHg VENTILATOR SETTINGS: Vent Mode:  [-] PRVC FiO2 (%):  [60 %-100 %] 60 % Set Rate:  [15 bmp-30 bmp] 30 bmp Vt Set:  [500 mL] 500 mL PEEP:  [5 cmH20-14 cmH20] 8 cmH20 Plateau Pressure:  [27 cmH20-36 cmH20] 30 cmH20 INTAKE / OUTPUT:  Intake/Output Summary (Last 24 hours) at 11/01/14 0750 Last data filed at 11/01/14 0726  Gross per 24 hour  Intake 3528.33 ml  Output   2429 ml  Net 1099.33 ml   PHYSICAL EXAMINATION: General:  Lying in bed, on mechanical ventilation Neuro:  Sedated and paralyzed HEENT:  ETT in  place Cardiovascular:  RRR Lungs:  CTA B/L Abdomen:  Cooling pads in place Musculoskeletal:  Cool, pale, weak pulses   LABS:  CBC  Recent Labs Lab 10/31/14 1002  10/31/14 1324 10/31/14 1420 10/31/14 1529  WBC 35.7*  --   --   --   --   HGB 9.3*  < > 10.9* 9.5* 13.3  HCT 28.6*  < > 32.0* 28.0* 39.0  PLT PLATELET CLUMPS NOTED ON SMEAR, COUNT APPEARS ADEQUATE  --   --   --   --   < > = values in this interval not displayed. Coag's  Recent Labs Lab 10/31/14 0954 10/31/14 1055 10/31/14 1826  APTT 51* 148* 42*  INR 1.16 1.54* 1.25   BMET  Recent Labs Lab 10/31/14 2232 11/01/14 0211 11/01/14 0605  NA 132* 134* 135  K 5.0 3.9 3.8  CL 92* 95* 93*  CO2 26 26 27   BUN 25* 26* 27*  CREATININE 5.99* 6.42* 6.28*  GLUCOSE 324* 214* 146*   Electrolytes  Recent Labs Lab 10/31/14 2232 11/01/14 0211 11/01/14 0605  CALCIUM 9.2 9.5 9.3   ABG  Recent Labs Lab 10/31/14 1416 10/31/14 1654 10/31/14 2135  PHART 7.343* 7.342* 7.431  PCO2ART 43.8 42.7 31.7*  PO2ART 52.0* 68.0* 328.0*   Cardiac Enzymes  Recent Labs Lab 10/31/14 1826 10/31/14 2233 11/01/14 0405  TROPONINI 2.00* 2.44* 2.31*   Glucose  Recent Labs Lab 10/31/14 2106 10/31/14 2209 10/31/14  2309 11/01/14 0014 11/01/14 0110 11/01/14 0206  GLUCAP 368* 336* 296* 251* 190* 194*   Imaging Ct Head Wo Contrast  10/31/2014   CLINICAL DATA:  Dialysis patient with recent cardiac arrest, uncontrollable bleeding  EXAM: CT HEAD WITHOUT CONTRAST  TECHNIQUE: Contiguous axial images were obtained from the base of the skull through the vertex without intravenous contrast.  COMPARISON:  None.  FINDINGS: The bony calvarium is intact. Air-fluid levels are noted within the sphenoid sinuses which may be related to acute sinusitis. Endotracheal tube and nasogastric catheter are noted. No findings to suggest acute hemorrhage, acute infarction or space-occupying mass lesion are noted.  IMPRESSION: Changes in the sphenoid  sinuses which may be related to acute sinusitis. No other focal abnormality is seen.   Electronically Signed   By: Inez Catalina M.D.   On: 10/31/2014 11:19   Dg Chest Port 1 View  10/31/2014   CLINICAL DATA:  OG tube placement.  EXAM: PORTABLE CHEST - 1 VIEW  COMPARISON:  One-view chest 04/18/2014.  FINDINGS: The patient has been intubated. The endotracheal tube terminates 4.9 cm above the carina. The heart is enlarged. Diffuse interstitial and airspace disease has progressed with more consolidative changes now on the right upper lobe and left lower lobe. The left hemi thorax is obscured by a to fibrillation pad. The orogastric tube courses off the inferior border of the film, within the stomach.  IMPRESSION: 1. Interval intubation. The endotracheal tube is in satisfactory position. 2. OG tube courses off the inferior border the film. 3. Cardiomegaly with increasing interstitial and airspace disease. This likely reflects a combination of edema and atelectasis. Infection or aspiration is also considered.   Electronically Signed   By: Lawrence Santiago M.D.   On: 10/31/2014 10:21   ASSESSMENT / PLAN:  PULMONARY OETT 1/25>>> Acute respiratory failure - post cardiac arrest  ?aspiration PNA  Combined acidosis Hx TB  Cooled- produce 30% less CO2 P:   Full vent support: 500/30/8/60% F/u abg today, may need to reduce even further MV F/u CXR this am  CARDIOVASCULAR CVL R fem 1/25>>> R fem aline 1/25>>>  Cardiac arrest - presumed r/t hyperkalemia in setting ESRD  Hx HTN  Cardiogenic shock--on pressors but BP fluctuating Tachycardia--now resolved Prolonged qtc  P:  Hypothermia protocol, rewarming around 1pm today HD 1/25 CE trending down: 2-->2.44-->2.31 Dopamine now off, on Levo and Vaso CVP 12 Cards following F/u lactate Consider cvp placement placement, inaccurate cvp from fem At rewarm reduce MAp goal  RENAL ESRD on HD, T,Th,Sat--apparently was to go out of town and received HD last  Thursday and Friday Hyperkalemia--resolved Hyponatremia--resolved  Mixed acidosis--AG 15  P:   Trend renal function panel Renal following, last HD 1/25  HCO3 gtt 100 cc/hr--will d/c May need cvvhd, pending neuro recovery? High risk brain death  GASTROINTESTINAL No active issue  P:   PPI  statr TF if not brain dead after rewarm   HEMATOLOGIC Anemia DVT prevention P:  Trend CBC Sub q heparin  INFECTIOUS ?aspiration  Sinusitis  Immunocompromised in past P:   Unasyn 1/25>>> Blood and sputum culture to be ordered--already started on abx Check lactic acid Pct to limit abx exposure, known ESRD may have elevated baseline  consider broaden to more aggressive coverage- to vanc, zosyn  ENDOCRINE Hyperglycemia  P:   Insulin gtt  NEUROLOGIC AMS - post cardiac arrest  Anoxic encephalopathy  P:   Paralyzed, sedated: Nimbex, Fentanyl, Versed Hypothermia protocol rewarm at 1 pm  FAMILY  -  Updates:  No family available 1/25 - ED trying to contact husband   - Inter-disciplinary family meet or Palliative Care meeting due by:  2/1   STAFF NOTE: I, Merrie Roof, MD FACP have personally reviewed patient's available data, including medical history, events of note, physical examination and test results as part of my evaluation. I have discussed with resident/NP and other care providers such as pharmacist, RN and RRT. In addition, I personally evaluated patient and elicited key findings of: dc bicarb, abg required, repeat pcxr, coarse BS, concern will be brain dead, send BC, ajust ABX, pct assessment, poor outcome likely, may need cvvhd The patient is critically ill with multiple organ systems failure and requires high complexity decision making for assessment and support, frequent evaluation and titration of therapies, application of advanced monitoring technologies and extensive interpretation of multiple databases.   Critical Care Time devoted to patient care services  described in this note is 35  Minutes. This time reflects time of care of this signee: Merrie Roof, MD FACP. This critical care time does not reflect procedure time, or teaching time or supervisory time of PA/NP/Med student/Med Resident etc but could involve care discussion time. Rest per NP/medical resident whose note is outlined above and that I agree with   Lavon Paganini. Titus Mould, MD, [place] Pgr: [facility] 11/01/2014 9:54 AM

## 2014-11-01 NOTE — Plan of Care (Signed)
Problem: Phase I Progression Outcomes Goal: Maintain MAP > 85 mmHg Outcome: Not Applicable Date Met:  35/46/56 MAP goal >80, progressing meeting goal with pressors

## 2014-11-01 NOTE — Procedures (Signed)
Hemodialysis Insertion Procedure Note [name] [id number] 1975/12/17  Procedure: Insertion of Hemodialysis Catheter Type: 3 port  Indications: Hemodialysis   Procedure Details Consent: Risks of procedure as well as the alternatives and risks of each were explained to the (patient/caregiver).  Consent for procedure obtained. Time Out: Verified patient identification, verified procedure, site/side was marked, verified correct patient position, special equipment/implants available, medications/allergies/relevent history reviewed, required imaging and test results available.  Performed  Maximum sterile technique was used including antiseptics, cap, gloves, gown, hand hygiene, mask and sheet. Skin prep: Chlorhexidine; local anesthetic administered A antimicrobial bonded/coated triple lumen catheter was placed in the left femoral vein due to emergent situation using the Seldinger technique. Ultrasound guidance used.Yes.   Catheter placed to 20 cm. Blood aspirated via all 3 ports and then flushed x 3. Line sutured x 2 and dressing applied.  Evaluation Blood flow good Complications: No apparent complications Patient did tolerate procedure well. Chest X-ray ordered to verify placement.  CXR: normal.  Richardson Landry Minor ACNP Maryanna Shape PCCM Pager [phone] till 3 pm If no answer page [phone] 11/01/2014, 2:12 PM   [place] Tolerated well  Lavon Paganini. Titus Mould, MD, [place] Pgr: [facility]

## 2014-11-01 NOTE — Progress Notes (Signed)
Call by RN, K > 6.5 about 1h after start of CRRT.  On 4 K bath with Pre/Post/DFR of 400/200/1800.  Repeat K still high.  WIll change to 0K bath, check stat K q1h, and when K < 5, change back to all 4K bath.   Pearson Grippe

## 2014-11-01 NOTE — Progress Notes (Signed)
CRITICAL VALUE ALERT    Critical value received:  K 6.2  Date of notification:  11/01/2014   Time of notification:  2226  Critical value read back:Yes.    Nurse who received alert:  Monchel Pollitt, Meda Klinefelter   Expected value, currently treatment to correct K, q1hr K checks

## 2014-11-01 NOTE — Progress Notes (Addendum)
ANTIBIOTIC CONSULT NOTE - INITIAL  Pharmacy Consult for  Vancomycin/zosyn Indication: sepsis/PNA  Allergies  Allergen Reactions  . Ace Inhibitors Cough    Patient Measurements: Height: 5\' 7"  (170.2 cm) Weight: 181 lb 14.1 oz (82.5 kg) IBW/kg (Calculated) : 61.6  Vital Signs: Temp: 89.6 F (32 C) (01/26 1100) Temp Source: Core (Comment) (01/26 1100) Pulse Rate: 70 (01/26 1100) Intake/Output from previous day: 01/25 0701 - 01/26 0700 In: 3528.3 [I.V.:3428.3; IV Piggyback:100] Out: 2429  Intake/Output from this shift: Total I/O In: 503.3 [I.V.:503.3] Out: 0   Labs:  Recent Labs  10/31/14 1002  10/31/14 1324 10/31/14 1420 10/31/14 1529  10/31/14 2232 11/01/14 0211 11/01/14 0605  WBC 35.7*  --   --   --   --   --   --   --   --   HGB 9.3*  < > 10.9* 9.5* 13.3  --   --   --   --   PLT PLATELET CLUMPS NOTED ON SMEAR, COUNT APPEARS ADEQUATE  --   --   --   --   --   --   --   --   CREATININE  --   < > 9.50* 5.10* 5.00*  < > 5.99* 6.42* 6.28*  < > = values in this interval not displayed. Estimated Creatinine Clearance: 13.4 mL/min (by C-G formula based on Cr of 6.28). No results for input(s): VANCOTROUGH, VANCOPEAK, VANCORANDOM, GENTTROUGH, GENTPEAK, GENTRANDOM, TOBRATROUGH, TOBRAPEAK, TOBRARND, AMIKACINPEAK, AMIKACINTROU, AMIKACIN in the last 72 hours.   Microbiology: Recent Results (from the past 720 hour(s))  MRSA PCR Screening     Status: None   Collection Time: 10/31/14 11:33 AM  Result Value Ref Range Status   MRSA by PCR NEGATIVE NEGATIVE Final    Comment:        The GeneXpert MRSA Assay (FDA approved for NASAL specimens only), is one component of a comprehensive MRSA colonization surveillance program. It is not intended to diagnose MRSA infection nor to guide or monitor treatment for MRSA infections.     Medical History: Past Medical History  Diagnosis Date  . HTN (hypertension)     not well controlled  . TB (pulmonary tuberculosis) 2003, 2007     history of miliary TB partially treated in 2003, and then completely treated in 2007  . History of blood transfusion     "lots of times" (01/27/2013)  . CAP (community acquired pneumonia) 2010  . Type II diabetes mellitus   . Anemia   . Ovarian tumor, malignant 2005    resected in [place]' pt denies that this was cancer on 01/27/2013  . Diabetic retinopathy   . On home oxygen therapy     "5L; 24/7" (01/25/2014)  . ESRD on hemodialysis 08/25/2013    Started HD in 2008.  Had [place] transplant in 2010 at [facility]  Went back on HD in 2012.  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  The kidney was left in.  She now gets HD at [facility] on TTS schedule.     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  Both organs have since rejected, see "ESRD" for details.     Medications:  Scheduled:  . antiseptic oral rinse  7 mL Mouth Rinse QID  . artificial tears  1 application Both Eyes 3 times per day  . chlorhexidine  15 mL Mouth Rinse BID  . insulin aspart  1-3 Units  Subcutaneous 6 times per day  . insulin glargine  15 Units Subcutaneous Q24H  . pantoprazole (PROTONIX) IV  40 mg Intravenous Q24H  . piperacillin-tazobactam (ZOSYN)  IV  2.25 g Intravenous Q8H  . vancomycin  1,750 mg Intravenous Once    Assessment: 39 yo female s/p arrest on hypothermia protocol on unasyn for aspiration PNA and to change to vancomycin/zosyn for concern of sepsis. He is noted with ESRD and last HD was 1/25.   1/25 unasyn>> 1/26 1/26 vanc 1/26 zosyn>>  1/26 blood x2 1/26 resp  Goal of Therapy:  Vancomycin trough level 15-20 mcg/ml  Plan:  -Zosyn 2.25gm IV q8h -Vancomycin 1750mg  IV x1 and will follow HD plans to determine timing of maintenance dose -Will follow renal function, cultures and clinical progress  Hildred Laser, Pharm D 11/01/2014 11:34 AM  Addendum: vancomycin/zosyn -CRRT started today  Plan -Will change zosyn to 2.25gm IV  q6h -Vancomycin 750mg  IV q24h (next dose on 1/27) -Will follow patient progress  Hildred Laser, Pharm D 11/01/2014 1:04 PM

## 2014-11-01 NOTE — Progress Notes (Signed)
Unable to get accurate o2 readings despite changing sites. Readings range from 70-100's with bad waveform.  MD made aware. New orders received will continue to monitor.

## 2014-11-01 NOTE — Progress Notes (Signed)
PROGRESS NOTE  Subjective:   [name] is a 39 year old female with history of end-stage renal disease. She is status post renal transplant. The renal transplant has failed and she is been on hemodialysis. She collapsed at home today. Her husband started CPR and EMS was called. She was found have a wide-complex tachycardia when EMS arrived. Her initial potassium level was found to be 8. After about one hour she regained circulation. She was paralyzed, intubated, and cooled with the Arctic sun protocol.  Her rhythm is been stable overnight. She's requiring numerous pressors.  Objective:    Vital Signs:   Temp:  [89.2 F (31.8 C)-94.3 F (34.6 C)] 92.1 F (33.4 C) (01/26 0700) Pulse Rate:  [0-128] 66 (01/26 0730) Resp:  [17-33] 30 (01/26 0730) BP: (76-239)/(33-183) 118/67 mmHg (01/25 1659) SpO2:  [56 %-100 %] 85 % (01/26 0730) Arterial Line BP: (88-197)/(51-105) 116/66 mmHg (01/26 0730) FiO2 (%):  [60 %-100 %] 60 % (01/26 0304) Weight:  [173 lb 4.5 oz (78.6 kg)-181 lb 14.1 oz (82.5 kg)] 181 lb 14.1 oz (82.5 kg) (01/25 1120)      24-hour weight change: Weight change:   Weight trends: Filed Weights   10/31/14 1039 10/31/14 1120  Weight: 173 lb 4.5 oz (78.6 kg) 181 lb 14.1 oz (82.5 kg)    Intake/Output:  01/25 0701 - 01/26 0700 In: 3528.3 [I.V.:3428.3; IV Piggyback:100] Out: 2429      Physical Exam: BP 118/67 mmHg  Pulse 66  Temp(Src) 92.1 F (33.4 C) (Core (Comment))  Resp 30  Ht 5\' 7"  (1.702 m)  Wt 181 lb 14.1 oz (82.5 kg)  BMI 28.48 kg/m2  SpO2 85%  LMP   Wt Readings from Last 3 Encounters:  10/31/14 181 lb 14.1 oz (82.5 kg)  04/23/14 173 lb 4.5 oz (78.6 kg)  03/02/14 163 lb 12.8 oz (74.3 kg)    General: Vital signs reviewed and noted. Intubated, paralized  Head: Normocephalic, atraumatic.  Eyes: conjunctivae/corneas clear.  EOM's intact.   Throat: ETT in place   Neck:  normal   Lungs:    clear , on vent   Heart:  Rr   Abdomen:  Soft,  non-tender, non-distended    Extremities: No edema    Neurologic: Unable to assess   Psych: Unable to assess     Labs: BMET:  Recent Labs  11/01/14 0211 11/01/14 0605  NA 134* 135  K 3.9 3.8  CL 95* 93*  CO2 26 27  GLUCOSE 214* 146*  BUN 26* 27*  CREATININE 6.42* 6.28*  CALCIUM 9.5 9.3    Liver function tests: No results for input(s): AST, ALT, ALKPHOS, BILITOT, PROT, ALBUMIN in the last 72 hours. No results for input(s): LIPASE, AMYLASE in the last 72 hours.  CBC:  Recent Labs  10/31/14 1002  10/31/14 1420 10/31/14 1529  WBC 35.7*  --   --   --   HGB 9.3*  < > 9.5* 13.3  HCT 28.6*  < > 28.0* 39.0  MCV 92.9  --   --   --   PLT PLATELET CLUMPS NOTED ON SMEAR, COUNT APPEARS ADEQUATE  --   --   --   < > = values in this interval not displayed.  Cardiac Enzymes:  Recent Labs  10/31/14 1826 10/31/14 2233 11/01/14 0405  TROPONINI 2.00* 2.44* 2.31*    Coagulation Studies:  Recent Labs  10/31/14 0954 10/31/14 1055 10/31/14 1826  LABPROT 14.9 18.6* 15.9*  INR 1.16 1.54*  1.25    Other: Invalid input(s): POCBNP No results for input(s): DDIMER in the last 72 hours. No results for input(s): HGBA1C in the last 72 hours. No results for input(s): CHOL, HDL, LDLCALC, TRIG, CHOLHDL in the last 72 hours. No results for input(s): TSH, T4TOTAL, T3FREE, THYROIDAB in the last 72 hours.  Invalid input(s): FREET3 No results for input(s): VITAMINB12, FOLATE, FERRITIN, TIBC, IRON, RETICCTPCT in the last 72 hours.   Other results:  EKG:  NSR, TWI diffusely   Medications:    Infusions: . sodium chloride 10 mL/hr at 10/31/14 1200  . sodium chloride 10 mL/hr at 11/01/14 0530  . cisatracurium (NIMBEX) infusion 1 mcg/kg/min (10/31/14 2000)  . DOPamine Stopped (10/31/14 2212)  . fentaNYL infusion INTRAVENOUS 150 mcg/hr (10/31/14 2000)  . insulin (NOVOLIN-R) infusion 4.5 Units/hr (11/01/14 0726)  . midazolam (VERSED) infusion 3 mg/hr (11/01/14 0726)  .  norepinephrine (LEVOPHED) Adult infusion 6 mcg/min (11/01/14 0531)  .  sodium bicarbonate  infusion 1000 mL 100 mL/hr at 11/01/14 0151  . vasopressin (PITRESSIN) infusion - *FOR SHOCK* 0.03 Units/min (10/31/14 2000)    Scheduled Medications: . ampicillin-sulbactam (UNASYN) IV  1.5 g Intravenous Q12H  . antiseptic oral rinse  7 mL Mouth Rinse QID  . artificial tears  1 application Both Eyes 3 times per day  . chlorhexidine  15 mL Mouth Rinse BID  . pantoprazole (PROTONIX) IV  40 mg Intravenous Q24H    Assessment/ Plan:      Cardiac arrest - most likely due to hyperkalemia. This is being corrected with dialysis. Her rhythm has been stable since that time.    Respiratory failure - she is currently intubated and on the ventilator. Further management per [facility] M.    Anoxic encephalopathy - she had a prolonged down time. She'll need to be assessed by neurology.    Cardiogenic shock - she is requiring numerous pressors. Continue current medications.   Disposition:  Length of Stay: 1  Thayer Headings, Brooke Bonito., MD, [facility] 11/01/2014, 7:55 AM Office [phone] Pager [phone]

## 2014-11-01 NOTE — Progress Notes (Signed)
CRITICAL VALUE ALERT  Critical value received:  K 6.7  Date of notification:  11/01/2014  Time of notification:  1915  Critical value read back:Yes.    Nurse who received alert:  Carleene Cooper  MD notified (1st page):  Dr. Lamonte Sakai  Time of first page:  1921  MD notified (2nd page): Dr. Alfonso Ellis  Time of second page:1924  Responding MD:  Dr. Alfonso Ellis  Time MD responded:  [phone]  Orders received to repeat K stat

## 2014-11-01 NOTE — Procedures (Signed)
Central Venous Catheter Insertion Procedure Note [name] [id number] 09-Jun-1976  Procedure: Insertion of Central Venous Catheter Indications: Drug and/or fluid administration  Procedure Details Consent: Risks of procedure as well as the alternatives and risks of each were explained to the (patient/caregiver).  Consent for procedure obtained. Time Out: Verified patient identification, verified procedure, site/side was marked, verified correct patient position, special equipment/implants available, medications/allergies/relevent history reviewed, required imaging and test results available.  Performed  Maximum sterile technique was used including antiseptics, cap, gloves, gown, hand hygiene, mask and sheet. Skin prep: Chlorhexidine; local anesthetic administered A antimicrobial bonded/coated triple lumen catheter was placed in the left internal jugular vein using the Seldinger technique. Ultrasound guidance used.Yes.   Catheter placed to 20 cm. Blood aspirated via all 3 ports and then flushed x 3. Line sutured x 2 and dressing applied.  Evaluation Blood flow good Complications: No apparent complications Patient did tolerate procedure well. Chest X-ray ordered to verify placement.  CXR: pending.  [name] Minor ACNP [name] PCCM Pager [phone] till 3 pm If no answer page [phone] 11/01/2014, 2:13 PM   [place]  Tolerated well  [name]. Titus Mould, MD, [place] Pgr: [facility]

## 2014-11-01 NOTE — Progress Notes (Signed)
CRITICAL VALUE ALERT  Critical value received: K 6.6  Date of notification:  11/01/2014  Time of notification:  1939  Critical value read back:Yes.    Nurse who received alert:  Carleene Cooper  MD notified (1st page):  Dr. Alfonso Ellis  Time of first page:  1940  MD notified (2nd page):  Time of second page:  Responding MD:  Dr. Alfonso Ellis  Time MD responded:  [phone]

## 2014-11-01 NOTE — Progress Notes (Signed)
CRITICAL VALUE ALERT  Critical value received:  Lactic Acid 3.4  Date of notification:  11/01/2014  Time of notification:  9872  Critical value read back:Yes.    Nurse who received alert:  Carleene Cooper   MD notified (1st page):  Dr. Merrie Roof   Time of first page:  At bedside when value received  MD notified (2nd page):  Time of second page:  Responding MD: Dr. Clide Dales  Time MD responded:  At bedside when value received

## 2014-11-02 ENCOUNTER — Inpatient Hospital Stay (HOSPITAL_COMMUNITY): Payer: Medicare Other

## 2014-11-02 DIAGNOSIS — I5021 Acute systolic (congestive) heart failure: Secondary | ICD-10-CM

## 2014-11-02 DIAGNOSIS — R570 Cardiogenic shock: Secondary | ICD-10-CM

## 2014-11-02 DIAGNOSIS — I469 Cardiac arrest, cause unspecified: Secondary | ICD-10-CM

## 2014-11-02 LAB — POCT I-STAT, CHEM 8
BUN: 18 mg/dL (ref 6–23)
BUN: 18 mg/dL (ref 6–23)
BUN: 21 mg/dL (ref 6–23)
BUN: 21 mg/dL (ref 6–23)
BUN: 23 mg/dL (ref 6–23)
BUN: 24 mg/dL — AB (ref 6–23)
CALCIUM ION: 0.99 mmol/L — AB (ref 1.12–1.23)
CALCIUM ION: 1 mmol/L — AB (ref 1.12–1.23)
CALCIUM ION: 1.1 mmol/L — AB (ref 1.12–1.23)
CHLORIDE: 114 mmol/L — AB (ref 96–112)
CHLORIDE: 99 mmol/L (ref 96–112)
CHLORIDE: 99 mmol/L (ref 96–112)
Calcium, Ion: 1.08 mmol/L — ABNORMAL LOW (ref 1.12–1.23)
Calcium, Ion: 1.07 mmol/L — ABNORMAL LOW (ref 1.12–1.23)
Calcium, Ion: 1.1 mmol/L — ABNORMAL LOW (ref 1.12–1.23)
Chloride: 104 mmol/L (ref 96–112)
Chloride: 111 mmol/L (ref 96–112)
Chloride: 101 mmol/L (ref 96–112)
Creatinine, Ser: 3.1 mg/dL — ABNORMAL HIGH (ref 0.50–1.10)
Creatinine, Ser: 3.8 mg/dL — ABNORMAL HIGH (ref 0.50–1.10)
Creatinine, Ser: 4.1 mg/dL — ABNORMAL HIGH (ref 0.50–1.10)
Creatinine, Ser: 4.1 mg/dL — ABNORMAL HIGH (ref 0.50–1.10)
Creatinine, Ser: 4.2 mg/dL — ABNORMAL HIGH (ref 0.50–1.10)
Creatinine, Ser: 4.4 mg/dL — ABNORMAL HIGH (ref 0.50–1.10)
GLUCOSE: 127 mg/dL — AB (ref 70–99)
GLUCOSE: 131 mg/dL — AB (ref 70–99)
Glucose, Bld: 106 mg/dL — ABNORMAL HIGH (ref 70–99)
Glucose, Bld: 110 mg/dL — ABNORMAL HIGH (ref 70–99)
Glucose, Bld: 112 mg/dL — ABNORMAL HIGH (ref 70–99)
Glucose, Bld: 133 mg/dL — ABNORMAL HIGH (ref 70–99)
HCT: 22 % — ABNORMAL LOW (ref 36.0–46.0)
HCT: 23 % — ABNORMAL LOW (ref 36.0–46.0)
HCT: 24 % — ABNORMAL LOW (ref 36.0–46.0)
HCT: 27 % — ABNORMAL LOW (ref 36.0–46.0)
HCT: 58 % — ABNORMAL HIGH (ref 36.0–46.0)
HEMATOCRIT: 29 % — AB (ref 36.0–46.0)
HEMOGLOBIN: 19.7 g/dL — AB (ref 12.0–15.0)
HEMOGLOBIN: 7.8 g/dL — AB (ref 12.0–15.0)
HEMOGLOBIN: 8.2 g/dL — AB (ref 12.0–15.0)
Hemoglobin: 9.9 g/dL — ABNORMAL LOW (ref 12.0–15.0)
Hemoglobin: 9.2 g/dL — ABNORMAL LOW (ref 12.0–15.0)
Hemoglobin: 7.5 g/dL — ABNORMAL LOW (ref 12.0–15.0)
POTASSIUM: 5.4 mmol/L — AB (ref 3.5–5.1)
POTASSIUM: 5.2 mmol/L — AB (ref 3.5–5.1)
POTASSIUM: 5.1 mmol/L (ref 3.5–5.1)
POTASSIUM: 4.5 mmol/L (ref 3.5–5.1)
Potassium: 5 mmol/L (ref 3.5–5.1)
Potassium: 5.2 mmol/L — ABNORMAL HIGH (ref 3.5–5.1)
SODIUM: 131 mmol/L — AB (ref 135–145)
SODIUM: 128 mmol/L — AB (ref 135–145)
Sodium: 125 mmol/L — ABNORMAL LOW (ref 135–145)
Sodium: 133 mmol/L — ABNORMAL LOW (ref 135–145)
Sodium: 133 mmol/L — ABNORMAL LOW (ref 135–145)
Sodium: 135 mmol/L (ref 135–145)
TCO2: 23 mmol/L (ref 0–100)
TCO2: 25 mmol/L (ref 0–100)
TCO2: 25 mmol/L (ref 0–100)
TCO2: 26 mmol/L (ref 0–100)
TCO2: 27 mmol/L (ref 0–100)
TCO2: 28 mmol/L (ref 0–100)

## 2014-11-02 LAB — BLOOD GAS, ARTERIAL
Acid-Base Excess: 0.7 mmol/L (ref 0.0–2.0)
BICARBONATE: 26.7 meq/L — AB (ref 20.0–24.0)
FIO2: 0.6 %
MECHVT: 500 mL
O2 Saturation: 96.3 %
PEEP: 10 cmH2O
PO2 ART: 110 mmHg — AB (ref 80.0–100.0)
Patient temperature: 98.6
RATE: 14 resp/min
TCO2: 28.5 mmol/L (ref 0–100)
pCO2 arterial: 58.7 mmHg (ref 35.0–45.0)
pH, Arterial: 7.28 — ABNORMAL LOW (ref 7.350–7.450)

## 2014-11-02 LAB — CBC WITH DIFFERENTIAL/PLATELET
BASOS ABS: 0 10*3/uL (ref 0.0–0.1)
Basophils Relative: 0 % (ref 0–1)
Eosinophils Absolute: 0 10*3/uL (ref 0.0–0.7)
Eosinophils Relative: 0 % (ref 0–5)
HCT: 25.8 % — ABNORMAL LOW (ref 36.0–46.0)
Hemoglobin: 8.5 g/dL — ABNORMAL LOW (ref 12.0–15.0)
LYMPHS PCT: 3 % — AB (ref 12–46)
Lymphs Abs: 0.8 10*3/uL (ref 0.7–4.0)
MCH: 30.1 pg (ref 26.0–34.0)
MCHC: 32.9 g/dL (ref 30.0–36.0)
MCV: 91.5 fL (ref 78.0–100.0)
MONO ABS: 0.5 10*3/uL (ref 0.1–1.0)
MONOS PCT: 2 % — AB (ref 3–12)
Neutro Abs: 24.8 10*3/uL — ABNORMAL HIGH (ref 1.7–7.7)
Neutrophils Relative %: 95 % — ABNORMAL HIGH (ref 43–77)
Platelets: 149 10*3/uL — ABNORMAL LOW (ref 150–400)
RBC: 2.82 MIL/uL — AB (ref 3.87–5.11)
RDW: 16.4 % — AB (ref 11.5–15.5)
WBC Morphology: INCREASED
WBC: 26.1 10*3/uL — AB (ref 4.0–10.5)

## 2014-11-02 LAB — POCT I-STAT 3, ART BLOOD GAS (G3+)
Bicarbonate: 25.9 mEq/L — ABNORMAL HIGH (ref 20.0–24.0)
O2 Saturation: 93 %
Patient temperature: 37.1
TCO2: 27 mmol/L (ref 0–100)
pCO2 arterial: 49.9 mmHg — ABNORMAL HIGH (ref 35.0–45.0)
pH, Arterial: 7.323 — ABNORMAL LOW (ref 7.350–7.450)
pO2, Arterial: 72 mmHg — ABNORMAL LOW (ref 80.0–100.0)

## 2014-11-02 LAB — GLUCOSE, CAPILLARY
GLUCOSE-CAPILLARY: 110 mg/dL — AB (ref 70–99)
GLUCOSE-CAPILLARY: 128 mg/dL — AB (ref 70–99)
Glucose-Capillary: 110 mg/dL — ABNORMAL HIGH (ref 70–99)
Glucose-Capillary: 115 mg/dL — ABNORMAL HIGH (ref 70–99)
Glucose-Capillary: 133 mg/dL — ABNORMAL HIGH (ref 70–99)
Glucose-Capillary: 144 mg/dL — ABNORMAL HIGH (ref 70–99)
Glucose-Capillary: 160 mg/dL — ABNORMAL HIGH (ref 70–99)

## 2014-11-02 LAB — RENAL FUNCTION PANEL
Albumin: 2.4 g/dL — ABNORMAL LOW (ref 3.5–5.2)
Anion gap: 7 (ref 5–15)
BUN: 18 mg/dL (ref 6–23)
CO2: 27 mmol/L (ref 19–32)
Calcium: 7.8 mg/dL — ABNORMAL LOW (ref 8.4–10.5)
Chloride: 100 mmol/L (ref 96–112)
Creatinine, Ser: 3.27 mg/dL — ABNORMAL HIGH (ref 0.50–1.10)
GFR calc non Af Amer: 17 mL/min — ABNORMAL LOW (ref >90)
GFR, EST AFRICAN AMERICAN: 20 mL/min — AB (ref >90)
Glucose, Bld: 142 mg/dL — ABNORMAL HIGH (ref 70–99)
PHOSPHORUS: 4.3 mg/dL (ref 2.3–4.6)
Potassium: 4.9 mmol/L (ref 3.5–5.1)
Sodium: 134 mmol/L — ABNORMAL LOW (ref 135–145)

## 2014-11-02 LAB — COMPREHENSIVE METABOLIC PANEL
ALBUMIN: 2.7 g/dL — AB (ref 3.5–5.2)
ALK PHOS: 63 U/L (ref 39–117)
ALT: 52 U/L — ABNORMAL HIGH (ref 0–35)
ANION GAP: 10 (ref 5–15)
AST: 46 U/L — ABNORMAL HIGH (ref 0–37)
BUN: 19 mg/dL (ref 6–23)
CO2: 28 mmol/L (ref 19–32)
Calcium: 8.1 mg/dL — ABNORMAL LOW (ref 8.4–10.5)
Chloride: 96 mmol/L (ref 96–112)
Creatinine, Ser: 4.32 mg/dL — ABNORMAL HIGH (ref 0.50–1.10)
GFR calc Af Amer: 14 mL/min — ABNORMAL LOW (ref >90)
GFR, EST NON AFRICAN AMERICAN: 12 mL/min — AB (ref >90)
GLUCOSE: 105 mg/dL — AB (ref 70–99)
Potassium: 4.9 mmol/L (ref 3.5–5.1)
Sodium: 134 mmol/L — ABNORMAL LOW (ref 135–145)
Total Bilirubin: 0.8 mg/dL (ref 0.3–1.2)
Total Protein: 6.4 g/dL (ref 6.0–8.3)

## 2014-11-02 LAB — BASIC METABOLIC PANEL
ANION GAP: 10 (ref 5–15)
BUN: 22 mg/dL (ref 6–23)
CO2: 28 mmol/L (ref 19–32)
CREATININE: 4.72 mg/dL — AB (ref 0.50–1.10)
Calcium: 7.9 mg/dL — ABNORMAL LOW (ref 8.4–10.5)
Chloride: 95 mmol/L — ABNORMAL LOW (ref 96–112)
GFR calc Af Amer: 13 mL/min — ABNORMAL LOW (ref >90)
GFR, EST NON AFRICAN AMERICAN: 11 mL/min — AB (ref >90)
GLUCOSE: 114 mg/dL — AB (ref 70–99)
POTASSIUM: 5.4 mmol/L — AB (ref 3.5–5.1)
Sodium: 133 mmol/L — ABNORMAL LOW (ref 135–145)

## 2014-11-02 LAB — LACTATE DEHYDROGENASE: LDH: 280 U/L — ABNORMAL HIGH (ref 94–250)

## 2014-11-02 LAB — CORTISOL: Cortisol, Plasma: 24.3 ug/dL

## 2014-11-02 LAB — MAGNESIUM: Magnesium: 2 mg/dL (ref 1.5–2.5)

## 2014-11-02 LAB — LACTIC ACID, PLASMA: Lactic Acid, Venous: 1.2 mmol/L (ref 0.5–2.0)

## 2014-11-02 LAB — PROCALCITONIN: Procalcitonin: 104.98 ng/mL

## 2014-11-02 LAB — APTT: APTT: 46 s — AB (ref 24–37)

## 2014-11-02 MED ORDER — HYDROCORTISONE NA SUCCINATE PF 100 MG IJ SOLR
50.0000 mg | Freq: Four times a day (QID) | INTRAMUSCULAR | Status: DC
  Administered 2014-11-02 – 2014-11-03 (×4): 50 mg via INTRAVENOUS
  Filled 2014-11-02 (×9): qty 1

## 2014-11-02 MED ORDER — PRISMASOL BGK 4/2.5 32-4-2.5 MEQ/L IV SOLN
INTRAVENOUS | Status: DC
  Administered 2014-11-02 – 2014-11-03 (×9): via INTRAVENOUS_CENTRAL
  Filled 2014-11-02 (×17): qty 5000

## 2014-11-02 MED ORDER — PRISMASOL BGK 4/2.5 32-4-2.5 MEQ/L IV SOLN
INTRAVENOUS | Status: DC
  Administered 2014-11-02 (×2): via INTRAVENOUS_CENTRAL
  Filled 2014-11-02 (×4): qty 5000

## 2014-11-02 MED ORDER — VITAL HIGH PROTEIN PO LIQD
1000.0000 mL | ORAL | Status: DC
  Filled 2014-11-02 (×2): qty 1000

## 2014-11-02 MED ORDER — PRISMASOL BGK 4/2.5 32-4-2.5 MEQ/L IV SOLN
INTRAVENOUS | Status: DC
  Administered 2014-11-02: 08:00:00 via INTRAVENOUS_CENTRAL
  Filled 2014-11-02 (×2): qty 5000

## 2014-11-02 MED ORDER — PRO-STAT SUGAR FREE PO LIQD
30.0000 mL | Freq: Three times a day (TID) | ORAL | Status: DC
  Administered 2014-11-02 – 2014-11-04 (×6): 30 mL
  Filled 2014-11-02 (×10): qty 30

## 2014-11-02 MED ORDER — ASPIRIN 81 MG PO CHEW
81.0000 mg | CHEWABLE_TABLET | Freq: Every day | ORAL | Status: DC
  Administered 2014-11-02 – 2014-11-16 (×15): 81 mg via ORAL
  Filled 2014-11-02 (×14): qty 1

## 2014-11-02 MED ORDER — NEPRO/CARBSTEADY PO LIQD
1000.0000 mL | ORAL | Status: DC
  Administered 2014-11-02 – 2014-11-03 (×2): 1000 mL
  Filled 2014-11-02 (×7): qty 1000

## 2014-11-02 NOTE — H&P (Signed)
INITIAL NUTRITION ASSESSMENT  DOCUMENTATION CODES Per approved criteria  -N/A   INTERVENTION: 1. Initiate Nepro @ 20 ml/hr via NG/OG and increase by 10 ml every 4 hours to goal rate of  35 ml/hr.     Tube feeding regimen provides 1812 kcal (105%  of needs), 113 grams of protein, and 610 ml of H2O.  W/ 1 Packet Prostat TID  NUTRITION DIAGNOSIS: Inadequate oral intake related to inability to eat as evidenced by NPO status  Goal:Tube feeding goal met in 24 hours. Pt to meet >/= 90% of their estimated nutrition needs with tube feeding   Monitor:  TF goal met, tolerance, Labs, I/Os, weight  Reason for Assessment: Recomendations for tube feeding/new vent  39 y.o. female  Admitting Dx: Cardiac Arrest  ASSESSMENT: 39 yo female with extensive PMH including HTN, ESRD, previous TB, DM, kidney-pancreas transplant 2010, on home O2 with witnessed arrest at home.  Was unable to determine usual/dry weight or oral intake over past months. Pt has increase in weight over past few months, though some may be due to fluid.    Patient is currently intubated on ventilator support MV: 8.5 L/min Temp (24hrs), Avg:95.9 F (35.5 C), Min:91.2 F (32.9 C), Max:99.1 F (37.3 C)  Nutrition Focused Physical Exam:  Subcutaneous Fat:  Orbital Region: None  Upper Arm Region: None  Thoracic and Lumbar Region: N/A   Muscle:  Temple Region: None  Clavicle Bone Region: None Clavicle and Acromion Bone Region: None Scapular Bone Region: N/A Dorsal Hand: N/A Patellar Region: None Anterior Thigh Region: N/A  Posterior Calf Region: None  Edema: None     Height: Ht Readings from Last 1 Encounters:  10/31/14 '5\' 7"'  (1.702 m)    Weight: Wt Readings from Last 1 Encounters:  11/02/14 183 lb 6.8 oz (83.2 kg)    Ideal Body Weight:135  % Ideal Body Weight: 137%  Wt Readings from Last 10 Encounters:  11/02/14 183 lb 6.8 oz (83.2 kg)  04/23/14 173 lb 4.5 oz (78.6 kg)  03/02/14 163 lb 12.8 oz  (74.3 kg)  01/25/14 162 lb 14.7 oz (73.9 kg)  12/18/13 165 lb 5.5 oz (75 kg)  08/25/13 171 lb 4.8 oz (77.7 kg)  08/11/13 174 lb (78.926 kg)  04/27/13 150 lb 9.2 oz (68.3 kg)  02/02/13 167 lb 12.3 oz (76.1 kg)  01/28/13 162 lb 0.6 oz (73.5 kg)    Usual Body Weight: Pt intubated, no family present to obtain usual weight   BMI:  Body mass index is 28.72 kg/(m^2).  Adjusted BW=78.7 kg Estimated Nutritional Needs: Kcal: 0981 kcals Protein: 105-120 g  Fluid: MD to monitor  Skin: WDL  Diet Order: NPO  EDUCATION NEEDS: -Education not appropriate at this time   Intake/Output Summary (Last 24 hours) at 11/02/14 0907 Last data filed at 11/02/14 0900  Gross per 24 hour  Intake 1802.06 ml  Output   1669 ml  Net 133.06 ml    Last BM: Incontinent  Labs:   Recent Labs Lab 11/01/14 1000  11/01/14 1809  11/01/14 2152  11/02/14 0153  11/02/14 0409 11/02/14 0500 11/02/14 0606  NA 132*  < > 134*  135  < > 132*  < > 133*  < > 135 134* 128*  K 4.7  < > 6.7*  6.7*  < > 6.2*  < > 5.4*  < > 5.1 4.9 4.5  CL 92*  < > 95*  94*  < > 94*  < > 95*  < >  99 96 111  CO2 26  --  31  26  --  28  --  28  --   --  28  --   BUN 29*  < > 30*  29*  < > 25*  < > 22  < > 24* 19 18  CREATININE 6.43*  < > 5.82*  5.84*  < > 5.15*  < > 4.72*  < > 4.10* 4.32* 3.80*  CALCIUM 8.7  --  8.2*  8.1*  --  8.1*  --  7.9*  --   --  8.1*  --   MG 1.7  --   --   --   --   --   --   --   --  2.0  --   PHOS  --   --  7.1*  --   --   --   --   --   --   --   --   GLUCOSE 165*  < > 158*  158*  < > 137*  < > 114*  < > 106* 105* 110*  < > = values in this interval not displayed.  CBG (last 3)   Recent Labs  11/02/14 0003 11/02/14 0351 11/02/14 0727  GLUCAP 133* 110* 110*    Scheduled Meds: . antiseptic oral rinse  7 mL Mouth Rinse QID  . aspirin  81 mg Oral Daily  . chlorhexidine  15 mL Mouth Rinse BID  . hydrocortisone sodium succinate  50 mg Intravenous Q6H  . insulin aspart  1-3 Units  Subcutaneous 6 times per day  . insulin glargine  15 Units Subcutaneous Q24H  . pantoprazole (PROTONIX) IV  40 mg Intravenous Q24H  . piperacillin-tazobactam (ZOSYN)  IV  2.25 g Intravenous Q6H  . vancomycin  750 mg Intravenous Q24H    Continuous Infusions: . sodium chloride Stopped (11/01/14 0800)  . sodium chloride Stopped (11/01/14 0800)  . cisatracurium (NIMBEX) infusion Stopped (11/01/14 2336)  . dextrose    . DOPamine Stopped (10/31/14 2212)  . fentaNYL infusion INTRAVENOUS 100 mcg/hr (11/02/14 0806)  . norepinephrine (LEVOPHED) Adult infusion 13.973 mcg/min (11/02/14 0700)  . dialysis replacement fluid (prismasate) 200 mL/hr at 11/02/14 0810  . dialysis replacement fluid (prismasate) 400 mL/hr at 11/02/14 0810  . dialysate (PRISMASATE) 1,800 mL/hr at 11/02/14 0810  . vasopressin (PITRESSIN) infusion - *FOR SHOCK* Stopped (11/02/14 0800)    Past Medical History  Diagnosis Date  . HTN (hypertension)     not well controlled  . 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 History of blood transfusion     "lots of times" (01/27/2013)  . CAP (community acquired pneumonia) 2010  . Type II diabetes mellitus   . Anemia   . Ovarian tumor, malignant 2005    resected in [place]' pt denies that this was cancer on 01/27/2013  . Diabetic retinopathy   . On home oxygen therapy     "5L; 24/7" (01/25/2014)  . ESRD on hemodialysis 08/25/2013    Started HD in 2008.  Had [place] transplant in 2010 at [facility].  Went back on HD in 2012.  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  The kidney was left in.  She now gets HD at [facility] on TTS schedule.     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  Both organs have since rejected, see "ESRD" for details.     Past Surgical History  Procedure Laterality Date  . Tumor removal  2005    Ovarian,  resected in [place]  . Av fistula placement  06/03/2012    Procedure: INSERTION OF ARTERIOVENOUS (AV) GORE-TEX GRAFT ARM;  Surgeon: Rosetta Posner, MD;  Location: [facility];  Service: Vascular;  Laterality: Left;  [name] Eye surgery Right     "cause of diabetes" (4/23/20140  . Combined kidney-pancreas transplant  2010    /notes 01/27/2013; @ [facility]  . 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 Renal biopsy  12/13/2010; 10/25/2011    Archie Endo 01/27/2013  . Tee without cardioversion N/A 04/22/2014    Procedure: TRANSESOPHAGEAL ECHOCARDIOGRAM (TEE);  Surgeon: Fay Records, MD;  Location: [facility] ENDOSCOPY;  Service: Cardiovascular;  Laterality: N/A;  [place], [place] [facility] [phone] 11/02/2014 9:40 AM

## 2014-11-02 NOTE — Progress Notes (Signed)
[facility] Progress Note   Subjective: on levo gtt 8ug/min, off vaso gtt, warmed up earlier this am  Filed Vitals:   11/02/14 1000 11/02/14 1030 11/02/14 1100 11/02/14 1200  BP:      Pulse: 108 107 107 104  Temp: 98.4 F (36.9 C)  98.8 F (37.1 C) 98.8 F (37.1 C)  TempSrc: Core (Comment)  Core (Comment) Core (Comment)  Resp: 21 24 18 24   Height:      Weight:      SpO2: 99% 100% 99% 100%   Exam: Intubated, w ETT in place No jvd Chest coarse BS bilat RRR tachy no MRG Abd soft, NTND Diffuse 1+ edema Neuro is sedated on vent LUA AVG +bruit  CXR - bilat air space opacities, worse than yesterday  HD: TTS [place] 4h 72.5kg 2/2.25 Bath 400/A1.5 LUA AVG Heparin 2400 Aranesp 200 / wk, Hect 5 ug, Venofer 100/ hd thru 1/28   Assessment: 1. Cardiac arrest - likely due to hyperkalemia; s/p cooling protocol 2. Severe hyperkalemia - resolved 3. ESRD on HD TTS 4. Vol excess 5. Anemia on max esa 6. HTN /vol 7. VDRF - edema vs aspiration PNA by CXR 8. Hx failed KP transplant  Plan - cont CRRT, UF 50 -100 cc/hr as tolerated    Kelly Splinter MD  pager [phone]    cell [phone]  11/02/2014, 1:19 PM     Recent Labs Lab 11/01/14 1809  11/01/14 2152  11/02/14 0153  11/02/14 0409 11/02/14 0500 11/02/14 0606  NA 134*  135  < > 132*  < > 133*  < > 135 134* 128*  K 6.7*  6.7*  < > 6.2*  < > 5.4*  < > 5.1 4.9 4.5  CL 95*  94*  < > 94*  < > 95*  < > 99 96 111  CO2 31  26  --  28  --  28  --   --  28  --   GLUCOSE 158*  158*  < > 137*  < > 114*  < > 106* 105* 110*  BUN 30*  29*  < > 25*  < > 22  < > 24* 19 18  CREATININE 5.82*  5.84*  < > 5.15*  < > 4.72*  < > 4.10* 4.32* 3.80*  CALCIUM 8.2*  8.1*  --  8.1*  --  7.9*  --   --  8.1*  --   PHOS 7.1*  --   --   --   --   --   --   --   --   < > = values in this interval not displayed.  Recent Labs Lab 11/01/14 1809 11/02/14 0500  AST  --  46*  ALT  --  52*  ALKPHOS  --  63   BILITOT  --  0.8  PROT  --  6.4  ALBUMIN 2.6* 2.7*    Recent Labs Lab 10/31/14 1002  11/02/14 0409 11/02/14 0500 11/02/14 0606  WBC 35.7*  --   --  26.1*  --   NEUTROABS  --   --   --  24.8*  --   HGB 9.3*  < > 19.7* 8.5* 7.5*  HCT 28.6*  < > 58.0* 25.8* 22.0*  MCV 92.9  --   --  91.5  --   PLT PLATELET CLUMPS NOTED ON SMEAR, COUNT APPEARS ADEQUATE  --   --  149*  --   < > = values  in this interval not displayed. [name] antiseptic oral rinse  7 mL Mouth Rinse QID  . aspirin  81 mg Oral Daily  . chlorhexidine  15 mL Mouth Rinse BID  . hydrocortisone sodium succinate  50 mg Intravenous Q6H  . insulin aspart  1-3 Units Subcutaneous 6 times per day  . insulin glargine  15 Units Subcutaneous Q24H  . pantoprazole (PROTONIX) IV  40 mg Intravenous Q24H  . piperacillin-tazobactam (ZOSYN)  IV  2.25 g Intravenous Q6H  . vancomycin  750 mg Intravenous Q24H   . sodium chloride Stopped (11/01/14 0800)  . sodium chloride Stopped (11/01/14 0800)  . cisatracurium (NIMBEX) infusion Stopped (11/01/14 2336)  . dextrose    . DOPamine Stopped (10/31/14 2212)  . feeding supplement (VITAL HIGH PROTEIN)    . fentaNYL infusion INTRAVENOUS 75 mcg/hr (11/02/14 0958)  . norepinephrine (LEVOPHED) Adult infusion 8 mcg/min (11/02/14 1115)  . dialysis replacement fluid (prismasate) 200 mL/hr at 11/02/14 0810  . dialysis replacement fluid (prismasate) 400 mL/hr at 11/02/14 0810  . dialysate (PRISMASATE) 1,800 mL/hr at 11/02/14 1107  . vasopressin (PITRESSIN) infusion - *FOR SHOCK* Stopped (11/02/14 0800)   sodium chloride, sodium chloride, dextrose, feeding supplement (NEPRO CARB STEADY), fentaNYL, heparin, heparin, heparin, lidocaine (PF), lidocaine-prilocaine, pentafluoroprop-tetrafluoroeth, sodium chloride

## 2014-11-02 NOTE — Progress Notes (Signed)
PULMONARY / CRITICAL CARE MEDICINE   Name: [name] MRN: [id number] DOB: [birth year]    ADMISSION DATE:  10/31/2014  REFERRING MD :  EDP  CHIEF COMPLAINT:  Cardiac arrest   INITIAL PRESENTATION: 39 yo female with extensive PMH including HTN, ESRD on HD, previous TB, DM, kidney-pancreas transplant 2010, on home O2 with witnessed arrest at home.  Had her HD as regularly scheduled on 1/22.  Husband performed CPR.  Wide complex rhythm on EMS arrival.  Total ~1 hour CPR during multiple episodes, 7 epi, 6 defib.  K+ in ER was 8.1.  PCCM called to admit.   STUDIES:  CT head 1/25: ?acute sinusitis pCXR 1/25: cardiomegaly, increasing interstitial and airspace disease EEG 1/25: Anoxic encephalopathy Echo 1/26: severely reduced EF 25-30%, moderate LVH, grade 2 DD, PA pressure 66mmHg, trivial pericardial effusion  SIGNIFICANT EVENTS: 1/26: admit s/p cardiac arrest, HD, hypothermia protocol, hypotensive during HD 1/26: CVVHD started 1/27: warmed  SUBJECTIVE: Lying in bed, sedated, agitated on vent, hypertensive with tachycardia  VITAL SIGNS: Temp:  [88.7 F (31.5 C)-99.1 F (37.3 C)] 98.4 F (36.9 C) (01/27 0700) Pulse Rate:  [60-110] 103 (01/27 0716) Resp:  [11-30] 16 (01/27 0716) BP: (119)/(63) 119/63 mmHg (01/26 1202) SpO2:  [94 %-100 %] 100 % (01/27 0716) Arterial Line BP: (93-186)/(48-82) 139/59 mmHg (01/27 0700) FiO2 (%):  [60 %] 60 % (01/27 0716) Weight:  [183 lb 6.8 oz (83.2 kg)] 183 lb 6.8 oz (83.2 kg) (01/27 0500) HEMODYNAMICS: CVP:  [5 mmHg-12 mmHg] 7 mmHg VENTILATOR SETTINGS: Vent Mode:  [-] PRVC FiO2 (%):  [60 %] 60 % Set Rate:  [14 bmp-30 bmp] 14 bmp Vt Set:  [500 mL] 500 mL PEEP:  [8 cmH20-10 cmH20] 10 cmH20 Plateau Pressure:  [22 cmH20-33 cmH20] 22 cmH20 INTAKE / OUTPUT:  Intake/Output Summary (Last 24 hours) at 11/02/14 0752 Last data filed at 11/02/14 0700  Gross per 24 hour  Intake 2062.61 ml  Output   1481 ml  Net 581.61 ml   PHYSICAL  EXAMINATION: General:  Lying in bed, on mechanical ventilation and CRRT Neuro:  Agitated on vent off all sedation, appears to be responding to voice and pain HEENT:  ETT in place, moving eyes, turns to husband, tears Cardiovascular:  Tachycardia mild Lungs:  Coarse bl breath sounds Abdomen:  +bs Musculoskeletal:  Faint distal pulses, moving hands  LABS:  CBC  Recent Labs Lab 10/31/14 1002  11/02/14 0409 11/02/14 0500 11/02/14 0606  WBC 35.7*  --   --  26.1*  --   HGB 9.3*  < > 19.7* 8.5* 7.5*  HCT 28.6*  < > 58.0* 25.8* 22.0*  PLT PLATELET CLUMPS NOTED ON SMEAR, COUNT APPEARS ADEQUATE  --   --  149*  --   < > = values in this interval not displayed. Coag's  Recent Labs Lab 10/31/14 0954 10/31/14 1055 10/31/14 1826 11/02/14 0500  APTT 51* 148* 42* 46*  INR 1.16 1.54* 1.25  --    BMET  Recent Labs Lab 11/01/14 2152  11/02/14 0153  11/02/14 0409 11/02/14 0500 11/02/14 0606  NA 132*  < > 133*  < > 135 134* 128*  K 6.2*  < > 5.4*  < > 5.1 4.9 4.5  CL 94*  < > 95*  < > 99 96 111  CO2 28  --  28  --   --  28  --   BUN 25*  < > 22  < > 24* 19 18  CREATININE  5.15*  < > 4.72*  < > 4.10* 4.32* 3.80*  GLUCOSE 137*  < > 114*  < > 106* 105* 110*  < > = values in this interval not displayed. Electrolytes  Recent Labs Lab 11/01/14 1000 11/01/14 1809 11/01/14 2152 11/02/14 0153 11/02/14 0500  CALCIUM 8.7 8.2*  8.1* 8.1* 7.9* 8.1*  MG 1.7  --   --   --  2.0  PHOS  --  7.1*  --   --   --    ABG  Recent Labs Lab 10/31/14 2135 11/01/14 1041 11/01/14 1230  PHART 7.431 7.608* 7.360  PCO2ART 31.7* 25.1* 51.6*  PO2ART 328.0* 56.0* 66.0*   Cardiac Enzymes  Recent Labs Lab 10/31/14 1826 10/31/14 2233 11/01/14 0405  TROPONINI 2.00* 2.44* 2.31*   Glucose  Recent Labs Lab 11/01/14 1214 11/01/14 1605 11/01/14 1937 11/02/14 0003 11/02/14 0351 11/02/14 0727  GLUCAP 157* 160* 139* 133* 110* 110*   Imaging Dg Chest Port 1 View  11/01/2014   CLINICAL  DATA:  Central catheter placement  EXAM: PORTABLE CHEST - 1 VIEW  COMPARISON:  Study obtained earlier in the day  FINDINGS: Endotracheal tube tip is 2.3 cm above the carina. Left jugular catheter tip is in the superior vena cava slightly beyond the junction with the left innominate vein. Nasogastric tube tip and side port are below the diaphragm. No pneumothorax. There is moderate interstitial edema with cardiomegaly and pulmonary venous hypertension. There is airspace consolidation throughout the left lower lobe. There is a small left effusion. There is patchy airspace opacity in the right lower lobe as well.  IMPRESSION: Tube and catheter positions as described without pneumothorax. Persistent left lower lobe consolidation with left effusion. There is also patchy opacity in the right base which may represent alveolar edema. The overall appearance is consistent with congestive heart failure with questionable superimposed pneumonia in the lower lobes, particularly on the left.   Electronically Signed   By: Lowella Grip M.D.   On: 11/01/2014 15:01   Dg Chest Port 1 View  11/01/2014   CLINICAL DATA:  Acute respiratory failure, history hypertension, TB, type 2 diabetes, ovarian neoplasm, end-stage renal disease on dialysis  EXAM: PORTABLE CHEST - 1 VIEW  COMPARISON:  Portable exam 1013 hr compared to 10/31/2014  FINDINGS: Tip of endotracheal tube projects 2.7 cm above carina.  Nasogastric tube extends into stomach.  EKG leads and external pacing lead project over chest.  Borderline enlargement of cardiac silhouette.  Pulmonary vascular congestion.  Diffuse infiltrates likely pulmonary edema  Probable LEFT pleural effusion.  No pneumothorax.  IMPRESSION: Persistent pulmonary infiltrates question pulmonary edema with suspected small LEFT pleural effusion.   Electronically Signed   By: Lavonia [name] M.D.   On: 11/01/2014 10:53   ASSESSMENT / PLAN:  PULMONARY OETT 1/25>>> VDRF - post cardiac arrest   ?aspiration PNA  Combined acidosis Hx TB  P:   Full vent support: 500/14/5/60%, maintain current MV F/u abg today pending, ensure MV adaqaute F/u CXR this am Neg balance goals To goal 50% If able to lower fio2 then wean cpap5 ps 5, goal 2 hrs  CARDIOVASCULAR CVL R fem 1/25>>> R fem aline 1/25>>>  Cardiac arrest - presumed r/t hyperkalemia in setting ESRD  Hx HTN  Cardiogenic shock--on pressors but BP fluctuating Tachycardia--reccurent Prolonged qtc Cariomyoopathy P:  Warmed HD 1/25, CVVHD 1/26>>> CE trending down: 2-->2.44-->2.31 Dopamine and Vaso now off, Levo still on, hypertensive at times, rapid lower levo to map 55 with awakeness  F/u lactate: 3.4 yesterday MAP goal >55 Obtain cortisol, add stress roids fo rnow Ito less 10 levo could consider transition to neo  RENAL ESRD on HD, T,Th,Sat--apparently was to go out of town and received HD last Thursday and Friday Hyperkalemia--overnight, on K bath with CVVHD, now improved Hyponatremia Mixed acidosis--AG 10 P:   Trend renal function panel Renal following, On CRRT Neg balance required  GASTROINTESTINAL At risk ischemic bowel - no BM noted P:   PPI  Start TF Obtain lactic, ldh  HEMATOLOGIC Anemia (dilutional, esrd) DVT prevention P:  Trend CBC Sub q heparin maintain aranesp  INFECTIOUS ?aspiration  Sinusitis  Immunocompromised in past Sepsis likely (abdo source?), PCT down, pressors down, no bloody stool or diarhea Overall hypoperfusion? P:   Unasyn 1/25>>1/26 Vanc and Zosyn 1/26>>> Blood and sputum cultures 1/26>>> Procal: 160.15-->104.98  Maintain current treatments abx, clinical progress noted  ENDOCRINE Hyperglycemia R/o relAI P:   Insulin gt off ssi TF start cortisol  NEUROLOGIC AMS - post cardiac arrest  Anoxic encephalopathy Responsive today, following some commands, purposeful   P:   Off Nimbex. Fentanyl restart Dc versed Warmed  FAMILY  - Updates:  Updated husband by  bedside this morning  - Inter-disciplinary family meet or Palliative Care meeting due by:  2/1  STAFF NOTE: I, Merrie Roof, MD FACP have personally reviewed patient's available data, including medical history, events of note, physical examination and test results as part of my evaluation. I have discussed with resident/NP and other care providers such as pharmacist, RN and RRT. In addition, I personally evaluated patient and elicited key findings of: follow neuro recovery closely, start TF, maintain abx, seriel abdo examination, cvvhd to continue, wean if to 50% The patient is critically ill with multiple organ systems failure and requires high complexity decision making for assessment and support, frequent evaluation and titration of therapies, application of advanced monitoring technologies and extensive interpretation of multiple databases.   Critical Care Time devoted to patient care services described in this note is35 Minutes. This time reflects time of care of this signee: Merrie Roof, MD FACP. This critical care time does not reflect procedure time, or teaching time or supervisory time of PA/NP/Med student/Med Resident etc but could involve care discussion time. Rest per NP/medical resident whose note is outlined above and that I agree with   Lavon Paganini. Titus Mould, MD, Ashley Pgr: [facility] 11/02/2014 8:36 AM

## 2014-11-02 NOTE — Progress Notes (Signed)
0600 ISTAT K+ 4.5, reordered CRRT fluids K4/Ca2.5 as per Dr Joelyn Oms previous order

## 2014-11-02 NOTE — Progress Notes (Signed)
PROGRESS NOTE  Subjective:   [name] is a 39 year old female with history of end-stage renal disease. She is status post renal transplant. The renal transplant has failed and she is been on hemodialysis. She collapsed at home today. Her husband started CPR and EMS was called. She was found have a wide-complex tachycardia when EMS arrived. Her initial potassium level was found to be 8. After about one hour she regained circulation. She was paralyzed, intubated, and cooled with the Arctic sun protocol.  Her rhythm is been stable overnight. She's requiring numerous pressors.  I agree with echo results including the finding of severe LV dysfunction with EF 29-52%, diastolic CHF.   Objective:    Vital Signs:   Temp:  [88.7 F (31.5 C)-99.1 F (37.3 C)] 98.8 F (37.1 C) (01/27 0800) Pulse Rate:  [60-114] 114 (01/27 0800) Resp:  [11-30] 18 (01/27 0800) BP: (119)/(63) 119/63 mmHg (01/26 1202) SpO2:  [94 %-100 %] 100 % (01/27 0800) Arterial Line BP: (93-186)/(48-82) 115/53 mmHg (01/27 0815) FiO2 (%):  [60 %] 60 % (01/27 0716) Weight:  [183 lb 6.8 oz (83.2 kg)] 183 lb 6.8 oz (83.2 kg) (01/27 0500)  Last BM Date:  (PTA)   24-hour weight change: Weight change: 10 lb 2.3 oz (4.6 kg)  Weight trends: Filed Weights   10/31/14 1039 10/31/14 1120 11/02/14 0500  Weight: 173 lb 4.5 oz (78.6 kg) 181 lb 14.1 oz (82.5 kg) 183 lb 6.8 oz (83.2 kg)    Intake/Output:  01/26 0701 - 01/27 0700 In: 2062.6 [I.V.:1412.6; IV Piggyback:650] Out: 1481  Total I/O In: 25.8 [I.V.:25.8] Out: 96 [Other:96]   Physical Exam: BP 119/63 mmHg  Pulse 114  Temp(Src) 98.8 F (37.1 C) (Core (Comment))  Resp 18  Ht 5\' 7"  (1.702 m)  Wt 183 lb 6.8 oz (83.2 kg)  BMI 28.72 kg/m2  SpO2 100%  LMP   Wt Readings from Last 3 Encounters:  11/02/14 183 lb 6.8 oz (83.2 kg)  04/23/14 173 lb 4.5 oz (78.6 kg)  03/02/14 163 lb 12.8 oz (74.3 kg)    General: Vital signs reviewed and noted. Intubated, paralized    Head: Normocephalic, atraumatic.  Eyes: conjunctivae/corneas clear.  EOM's intact.   Throat: ETT in place   Neck:  normal   Lungs:    clear , on vent   Heart:  Rr   Abdomen:  Soft, non-tender, non-distended    Extremities: No edema    Neurologic: Unable to assess   Psych: Unable to assess     Labs: BMET:  Recent Labs  11/01/14 1000  11/01/14 1809  11/02/14 0153  11/02/14 0500 11/02/14 0606  NA 132*  < > 134*  135  < > 133*  < > 134* 128*  K 4.7  < > 6.7*  6.7*  < > 5.4*  < > 4.9 4.5  CL 92*  < > 95*  94*  < > 95*  < > 96 111  CO2 26  --  31  26  < > 28  --  28  --   GLUCOSE 165*  < > 158*  158*  < > 114*  < > 105* 110*  BUN 29*  < > 30*  29*  < > 22  < > 19 18  CREATININE 6.43*  < > 5.82*  5.84*  < > 4.72*  < > 4.32* 3.80*  CALCIUM 8.7  --  8.2*  8.1*  < > 7.9*  --  8.1*  --   MG 1.7  --   --   --   --   --  2.0  --   PHOS  --   --  7.1*  --   --   --   --   --   < > = values in this interval not displayed.  Liver function tests:  Recent Labs  11/01/14 1809 11/02/14 0500  AST  --  46*  ALT  --  52*  ALKPHOS  --  63  BILITOT  --  0.8  PROT  --  6.4  ALBUMIN 2.6* 2.7*   No results for input(s): LIPASE, AMYLASE in the last 72 hours.  CBC:  Recent Labs  10/31/14 1002  11/02/14 0500 11/02/14 0606  WBC 35.7*  --  26.1*  --   NEUTROABS  --   --  24.8*  --   HGB 9.3*  < > 8.5* 7.5*  HCT 28.6*  < > 25.8* 22.0*  MCV 92.9  --  91.5  --   PLT PLATELET CLUMPS NOTED ON SMEAR, COUNT APPEARS ADEQUATE  --  149*  --   < > = values in this interval not displayed.  Cardiac Enzymes:  Recent Labs  10/31/14 1826 10/31/14 2233 11/01/14 0405  TROPONINI 2.00* 2.44* 2.31*    Coagulation Studies:  Recent Labs  10/31/14 0954 10/31/14 1055 10/31/14 1826  LABPROT 14.9 18.6* 15.9*  INR 1.16 1.54* 1.25    Other: Invalid input(s): POCBNP No results for input(s): DDIMER in the last 72 hours. No results for input(s): HGBA1C in the last 72 hours. No  results for input(s): CHOL, HDL, LDLCALC, TRIG, CHOLHDL in the last 72 hours. No results for input(s): TSH, T4TOTAL, T3FREE, THYROIDAB in the last 72 hours.  Invalid input(s): FREET3 No results for input(s): VITAMINB12, FOLATE, FERRITIN, TIBC, IRON, RETICCTPCT in the last 72 hours.   Other results:  EKG:  NSR, TWI diffusely   Medications:    Infusions: . sodium chloride Stopped (11/01/14 0800)  . sodium chloride Stopped (11/01/14 0800)  . cisatracurium (NIMBEX) infusion Stopped (11/01/14 2336)  . dextrose    . DOPamine Stopped (10/31/14 2212)  . fentaNYL infusion INTRAVENOUS 100 mcg/hr (11/02/14 0806)  . midazolam (VERSED) infusion 1 mg/hr (11/02/14 0802)  . norepinephrine (LEVOPHED) Adult infusion 13.973 mcg/min (11/02/14 0700)  . dialysis replacement fluid (prismasate) 200 mL/hr at 11/02/14 0810  . dialysis replacement fluid (prismasate) 400 mL/hr at 11/02/14 0810  . dialysate (PRISMASATE) 1,800 mL/hr at 11/02/14 0810  . vasopressin (PITRESSIN) infusion - *FOR SHOCK* Stopped (11/02/14 0800)    Scheduled Medications: . antiseptic oral rinse  7 mL Mouth Rinse QID  . chlorhexidine  15 mL Mouth Rinse BID  . insulin aspart  1-3 Units Subcutaneous 6 times per day  . insulin glargine  15 Units Subcutaneous Q24H  . pantoprazole (PROTONIX) IV  40 mg Intravenous Q24H  . piperacillin-tazobactam (ZOSYN)  IV  2.25 g Intravenous Q6H  . vancomycin  750 mg Intravenous Q24H    Assessment/ Plan:      Cardiac arrest - most likely due to hyperkalemia. This is being corrected with dialysis. Her rhythm has been stable since that time.    Respiratory failure - she is currently intubated and on the ventilator. Further management per [facility] M.    Anoxic encephalopathy - she had a prolonged down time. She'll need to be assessed by neurology. She remains unresponsive.     Cardiogenic shock - she is requiring  numerous pressors. Continue current medications. She has severe LV dysfunction.  Continue  suportive care for now   Acute systolic CHF:  She has an EF of 25-30%. This may be due to her prolonged shock and may improve with supportive care.   Previous EF was normal.  I have discussed with Dr. Titus Mould.  I suggest repeat echo next week to see if EF has improved. If so, I dont think she needs an ischemic workup.  If the EF remains low, she needs further evaluation  Will sign off.  Call for questions.    Disposition:  Length of Stay: 2  Thayer Headings, Brooke Bonito., MD, [facility] 11/02/2014, 8:32 AM Office [phone] Pager [phone]

## 2014-11-03 ENCOUNTER — Inpatient Hospital Stay (HOSPITAL_COMMUNITY): Payer: Medicare Other

## 2014-11-03 LAB — RENAL FUNCTION PANEL
ALBUMIN: 2.5 g/dL — AB (ref 3.5–5.2)
Anion gap: 6 (ref 5–15)
BUN: 24 mg/dL — AB (ref 6–23)
CO2: 26 mmol/L (ref 19–32)
CREATININE: 2.74 mg/dL — AB (ref 0.50–1.10)
Calcium: 8 mg/dL — ABNORMAL LOW (ref 8.4–10.5)
Chloride: 101 mmol/L (ref 96–112)
GFR calc Af Amer: 24 mL/min — ABNORMAL LOW (ref >90)
GFR, EST NON AFRICAN AMERICAN: 21 mL/min — AB (ref >90)
Glucose, Bld: 127 mg/dL — ABNORMAL HIGH (ref 70–99)
PHOSPHORUS: 4.5 mg/dL (ref 2.3–4.6)
Potassium: 4.9 mmol/L (ref 3.5–5.1)
Sodium: 133 mmol/L — ABNORMAL LOW (ref 135–145)

## 2014-11-03 LAB — BLOOD GAS, ARTERIAL
Acid-Base Excess: 0.6 mmol/L (ref 0.0–2.0)
BICARBONATE: 25.8 meq/L — AB (ref 20.0–24.0)
FIO2: 0.5 %
LHR: 24 {breaths}/min
MECHVT: 500 mL
O2 SAT: 97.1 %
PCO2 ART: 50.1 mmHg — AB (ref 35.0–45.0)
PEEP: 5 cmH2O
PH ART: 7.332 — AB (ref 7.350–7.450)
Patient temperature: 98.8
TCO2: 27.3 mmol/L (ref 0–100)
pO2, Arterial: 126 mmHg — ABNORMAL HIGH (ref 80.0–100.0)

## 2014-11-03 LAB — CBC WITH DIFFERENTIAL/PLATELET
BASOS PCT: 0 % (ref 0–1)
Basophils Absolute: 0 10*3/uL (ref 0.0–0.1)
EOS PCT: 0 % (ref 0–5)
Eosinophils Absolute: 0 10*3/uL (ref 0.0–0.7)
HCT: 23 % — ABNORMAL LOW (ref 36.0–46.0)
Hemoglobin: 7.3 g/dL — ABNORMAL LOW (ref 12.0–15.0)
LYMPHS PCT: 4 % — AB (ref 12–46)
Lymphs Abs: 0.8 10*3/uL (ref 0.7–4.0)
MCH: 29.3 pg (ref 26.0–34.0)
MCHC: 31.7 g/dL (ref 30.0–36.0)
MCV: 92.4 fL (ref 78.0–100.0)
MONO ABS: 0.3 10*3/uL (ref 0.1–1.0)
Monocytes Relative: 2 % — ABNORMAL LOW (ref 3–12)
Neutro Abs: 17.8 10*3/uL — ABNORMAL HIGH (ref 1.7–7.7)
Neutrophils Relative %: 94 % — ABNORMAL HIGH (ref 43–77)
Platelets: 127 10*3/uL — ABNORMAL LOW (ref 150–400)
RBC: 2.49 MIL/uL — AB (ref 3.87–5.11)
RDW: 16.6 % — ABNORMAL HIGH (ref 11.5–15.5)
WBC: 18.9 10*3/uL — ABNORMAL HIGH (ref 4.0–10.5)

## 2014-11-03 LAB — CULTURE, RESPIRATORY W GRAM STAIN

## 2014-11-03 LAB — GLUCOSE, CAPILLARY
GLUCOSE-CAPILLARY: 128 mg/dL — AB (ref 70–99)
Glucose-Capillary: 118 mg/dL — ABNORMAL HIGH (ref 70–99)
Glucose-Capillary: 138 mg/dL — ABNORMAL HIGH (ref 70–99)
Glucose-Capillary: 133 mg/dL — ABNORMAL HIGH (ref 70–99)
Glucose-Capillary: 111 mg/dL — ABNORMAL HIGH (ref 70–99)
Glucose-Capillary: 132 mg/dL — ABNORMAL HIGH (ref 70–99)

## 2014-11-03 LAB — PROCALCITONIN: Procalcitonin: 68.36 ng/mL

## 2014-11-03 LAB — APTT: aPTT: 56 seconds — ABNORMAL HIGH (ref 24–37)

## 2014-11-03 LAB — CULTURE, RESPIRATORY

## 2014-11-03 LAB — MAGNESIUM: MAGNESIUM: 2.3 mg/dL (ref 1.5–2.5)

## 2014-11-03 MED ORDER — SODIUM CHLORIDE 0.9 % IV SOLN
INTRAVENOUS | Status: DC | PRN
  Administered 2014-11-05: 500 mL via INTRAVENOUS

## 2014-11-03 MED ORDER — METOPROLOL TARTRATE 1 MG/ML IV SOLN
INTRAVENOUS | Status: AC
  Filled 2014-11-03: qty 5

## 2014-11-03 MED ORDER — CEFTRIAXONE SODIUM IN DEXTROSE 20 MG/ML IV SOLN
1.0000 g | INTRAVENOUS | Status: DC
  Administered 2014-11-03 – 2014-11-04 (×2): 1 g via INTRAVENOUS
  Filled 2014-11-03 (×2): qty 50

## 2014-11-03 MED ORDER — METOPROLOL TARTRATE 1 MG/ML IV SOLN
2.5000 mg | INTRAVENOUS | Status: DC | PRN
  Administered 2014-11-03: 5 mg via INTRAVENOUS

## 2014-11-03 MED ORDER — PNEUMOCOCCAL VAC POLYVALENT 25 MCG/0.5ML IJ INJ: 0.5000 mL | INJECTION | INTRAMUSCULAR | Status: DC | PRN

## 2014-11-03 NOTE — Progress Notes (Signed)
[facility] Progress Note   Subjective: off all pressors, nimbex off, responding today  Filed Vitals:   11/03/14 0700 11/03/14 0800 11/03/14 0832 11/03/14 0900  BP:      Pulse: 98 105 101 99  Temp:  98.6 F (37 C)  98.2 F (36.8 C)  TempSrc:  Core (Comment)  Core (Comment)  Resp: 23 17 17 24   Height:      Weight:      SpO2: 100% 100% 100% 100%   Exam: Intubated, w ETT in place No jvd Chest coarse BS bilat RRR tachy no MRG Abd soft, NTND Diffuse 1+ edema Neuro is sedated on vent LUA AVG +bruit  CXR - bilat air space opacities, worse than yesterday  HD: TTS [place] 4h 72.5kg 2/2.25 Bath 400/A1.5 LUA AVG Heparin 2400 Aranesp 200 / wk, Hect 5 ug, Venofer 100/ hd thru 1/28   Assessment: 1. Cardiac arrest - likely due to hyperkalemia; s/p cooling protocol 2. Severe hyperkalemia - resolved 3. ESRD on HD TTS 4. Pulm edema/ aspiration - resolved mostly by CXR 5. Anemia on max esa 6. HTN /vol 7. VDRF 8. Hx failed KP transplant  Plan - will stop CRRT today , go to intermittent HD as needed    Kelly Splinter MD  pager [phone]    cell [phone]  11/03/2014, 9:55 AM     Recent Labs Lab 11/01/14 1809  11/02/14 0500  11/02/14 1414 11/02/14 1500 11/03/14 0400  NA 134*  135  < > 134*  < > 133* 134* 133*  K 6.7*  6.7*  < > 4.9  < > 5.0 4.9 4.9  CL 95*  94*  < > 96  < > 101 100 101  CO2 31  26  < > 28  --   --  27 26  GLUCOSE 158*  158*  < > 105*  < > 131* 142* 127*  BUN 30*  29*  < > 19  < > 18 18 24*  CREATININE 5.82*  5.84*  < > 4.32*  < > 3.10* 3.27* 2.74*  CALCIUM 8.2*  8.1*  < > 8.1*  --   --  7.8* 8.0*  PHOS 7.1*  --   --   --   --  4.3 4.5  < > = values in this interval not displayed.  Recent Labs Lab 11/02/14 0500 11/02/14 1500 11/03/14 0400  AST 46*  --   --   ALT 52*  --   --   ALKPHOS 63  --   --   BILITOT 0.8  --   --   PROT 6.4  --   --   ALBUMIN 2.7* 2.4* 2.5*    Recent Labs Lab 10/31/14 1002   11/02/14 0500 11/02/14 0606 11/02/14 1414 11/03/14 0400  WBC 35.7*  --  26.1*  --   --  18.9*  NEUTROABS  --   --  24.8*  --   --  17.8*  HGB 9.3*  < > 8.5* 7.5* 8.2* 7.3*  HCT 28.6*  < > 25.8* 22.0* 24.0* 23.0*  MCV 92.9  --  91.5  --   --  92.4  PLT PLATELET CLUMPS NOTED ON SMEAR, COUNT APPEARS ADEQUATE  --  149*  --   --  127*  < > = values in this interval not displayed. [name] antiseptic oral rinse  7 mL Mouth Rinse QID  . aspirin  81 mg Oral Daily  . cefTRIAXone (ROCEPHIN)  IV  1 g Intravenous Q24H  . chlorhexidine  15 mL Mouth Rinse BID  . feeding supplement (NEPRO CARB STEADY)  1,000 mL Per Tube Q24H  . feeding supplement (PRO-STAT SUGAR FREE 64)  30 mL Per Tube TID  . hydrocortisone sodium succinate  50 mg Intravenous Q6H  . insulin aspart  1-3 Units Subcutaneous 6 times per day  . insulin glargine  15 Units Subcutaneous Q24H  . pantoprazole (PROTONIX) IV  40 mg Intravenous Q24H   . sodium chloride Stopped (11/01/14 0800)  . sodium chloride Stopped (11/01/14 0800)  . cisatracurium (NIMBEX) infusion Stopped (11/01/14 2336)  . dextrose    . DOPamine Stopped (10/31/14 2212)  . fentaNYL infusion INTRAVENOUS 25 mcg/hr (11/03/14 0903)  . norepinephrine (LEVOPHED) Adult infusion Stopped (11/02/14 1813)  . dialysis replacement fluid (prismasate) 200 mL/hr at 11/02/14 0810  . dialysis replacement fluid (prismasate) 400 mL/hr at 11/02/14 2110  . dialysate (PRISMASATE) 1,800 mL/hr at 11/03/14 0805  . vasopressin (PITRESSIN) infusion - *FOR SHOCK* Stopped (11/02/14 0800)   sodium chloride, sodium chloride, dextrose, feeding supplement (NEPRO CARB STEADY), fentaNYL, heparin, heparin, heparin, lidocaine (PF), lidocaine-prilocaine, pentafluoroprop-tetrafluoroeth, sodium chloride

## 2014-11-03 NOTE — Progress Notes (Signed)
RT note-Increased RR, changed ventilator back to full support.

## 2014-11-03 NOTE — Progress Notes (Signed)
eLink Physician-Brief Progress Note Patient Name: [name] DOB: [birth year] MRN: [id number]   Date of Service  11/03/2014  HPI/Events of Note  Hypertensive coming off hypothermia and tachycardic as well  eICU Interventions  Add prn lopressor, increase fentanyl drip and add benzo's prn      Intervention Category Major Interventions: Hypertension - evaluation and management  Christinia Gully 11/03/2014, 8:12 PM

## 2014-11-03 NOTE — Progress Notes (Signed)
PULMONARY / CRITICAL CARE MEDICINE   Name: [name] MRN: [id number] DOB: [birth year]    ADMISSION DATE:  10/31/2014  REFERRING MD :  EDP  CHIEF COMPLAINT:  Cardiac arrest   INITIAL PRESENTATION: 39 yo female with extensive PMH including HTN, ESRD on HD, previous TB, DM, kidney-pancreas transplant 2010, on home O2 with witnessed arrest at home.  Had her HD as regularly scheduled on 1/22.  Husband performed CPR.  Wide complex rhythm on EMS arrival.  Total ~1 hour CPR during multiple episodes, 7 epi, 6 defib.  K+ in ER was 8.1.  PCCM called to admit.   STUDIES:  CT head 1/25: ?acute sinusitis pCXR 1/25: cardiomegaly, increasing interstitial and airspace disease EEG 1/25: Anoxic encephalopathy Echo 1/26: severely reduced EF 25-30%, moderate LVH, grade 2 DD, PA pressure 33mmHg, trivial pericardial effusion  SIGNIFICANT EVENTS: 1/26: admit s/p cardiac arrest, HD, hypothermia protocol, hypotensive during HD 1/26: CVVHD started 1/27: warmed  SUBJECTIVE: Lying in bed, sedated mild, on vent, off pressors  VITAL SIGNS: Temp:  [97.1 F (36.2 C)-99.1 F (37.3 C)] 99 F (37.2 C) (01/28 0600) Pulse Rate:  [95-114] 98 (01/28 0700) Resp:  [14-28] 23 (01/28 0700) SpO2:  [99 %-100 %] 100 % (01/28 0700) Arterial Line BP: (103-164)/(45-73) 120/55 mmHg (01/28 0700) FiO2 (%):  [50 %] 50 % (01/28 0400) Weight:  [179 lb 14.3 oz (81.6 kg)] 179 lb 14.3 oz (81.6 kg) (01/28 0500) HEMODYNAMICS: CVP:  [5 mmHg-10 mmHg] 10 mmHg VENTILATOR SETTINGS: Vent Mode:  [-] PRVC FiO2 (%):  [50 %] 50 % Set Rate:  [24 bmp] 24 bmp Vt Set:  [500 mL] 500 mL PEEP:  [5 cmH20] 5 cmH20 Plateau Pressure:  [19 cmH20-26 cmH20] 21 cmH20 INTAKE / OUTPUT:  Intake/Output Summary (Last 24 hours) at 11/03/14 0748 Last data filed at 11/03/14 0700  Gross per 24 hour  Intake 1221.76 ml  Output   3082 ml  Net -1860.24 ml   PHYSICAL EXAMINATION: General:  Lying in bed, on mechanical ventilation and CRRT Neuro:  Turns to voice  but not following other commands, sedated mild, tracks people in room HEENT:  ETT in place, spontaneous blinking, moving eyes Cardiovascular:  RRR s1 s 2 Lungs:  Coarse bl breath sounds Abdomen:  +bs, soft Musculoskeletal:  Faint distal pulses, moving hands and toes  LABS:  CBC  Recent Labs Lab 10/31/14 1002  11/02/14 0500 11/02/14 0606 11/02/14 1414 11/03/14 0400  WBC 35.7*  --  26.1*  --   --  18.9*  HGB 9.3*  < > 8.5* 7.5* 8.2* 7.3*  HCT 28.6*  < > 25.8* 22.0* 24.0* 23.0*  PLT PLATELET CLUMPS NOTED ON SMEAR, COUNT APPEARS ADEQUATE  --  149*  --   --  127*  < > = values in this interval not displayed. Coag's  Recent Labs Lab 10/31/14 0954 10/31/14 1055 10/31/14 1826 11/02/14 0500 11/03/14 0400  APTT 51* 148* 42* 46* 56*  INR 1.16 1.54* 1.25  --   --    BMET  Recent Labs Lab 11/02/14 0500  11/02/14 1414 11/02/14 1500 11/03/14 0400  NA 134*  < > 133* 134* 133*  K 4.9  < > 5.0 4.9 4.9  CL 96  < > 101 100 101  CO2 28  --   --  27 26  BUN 19  < > 18 18 24*  CREATININE 4.32*  < > 3.10* 3.27* 2.74*  GLUCOSE 105*  < > 131* 142* 127*  < > =  values in this interval not displayed. Electrolytes  Recent Labs Lab 11/01/14 1000 11/01/14 1809  11/02/14 0500 11/02/14 1500 11/03/14 0400  CALCIUM 8.7 8.2*  8.1*  < > 8.1* 7.8* 8.0*  MG 1.7  --   --  2.0  --   --   PHOS  --  7.1*  --   --  4.3 4.5  < > = values in this interval not displayed. ABG  Recent Labs Lab 11/02/14 0845 11/02/14 1140 11/03/14 0419  PHART 7.280* 7.323* 7.332*  PCO2ART 58.7* 49.9* 50.1*  PO2ART 110.0* 72.0* 126.0*   Cardiac Enzymes  Recent Labs Lab 10/31/14 1826 10/31/14 2233 11/01/14 0405  TROPONINI 2.00* 2.44* 2.31*   Glucose  Recent Labs Lab 11/02/14 0727 11/02/14 1205 11/02/14 1602 11/02/14 1923 11/02/14 2351 11/03/14 0404  GLUCAP 110* 115* 144* 128* 128* 118*   Imaging Dg Chest Port 1 View  11/02/2014   CLINICAL DATA:  Acute respiratory failure  EXAM: PORTABLE  CHEST - 1 VIEW  COMPARISON:  11/01/2014  FINDINGS: Endotracheal tube tip is 2 cm above the carina. The nasogastric tube extends well into the stomach and off the inferior edge of the image. The left jugular central line tip is in the expected location of the SVC just below the azygos vein junction.  There are worsening airspace opacities bilaterally. There are small effusions bilaterally, unchanged. No pneumothorax is evident.  IMPRESSION: Worsening airspace opacities bilaterally. Small unchanged pleural effusions.   Electronically Signed   By: Andreas Newport M.D.   On: 11/02/2014 08:35   ASSESSMENT / PLAN:  PULMONARY OETT 1/25>>> VDRF - post cardiac arrest  ?aspiration PNA  Combined acidosis Hx TB  P:   Full vent support: 500/24/5/50%, maintain current MV, needs rate still abg reviewed, will attempt cpap 5 ps 5 F/u CXR am, major improved after 2 liters off last 24 hrs Neg balance goals further as goal to int hd  CARDIOVASCULAR CVL R fem 1/25>>>1/26 R fem aline 1/25>>> IJ  cvl left 1.26>>> Left fem hd 1/26>>>  Cardiac arrest - presumed r/t hyperkalemia in setting ESRD  Hx HTN  Cardiogenic shock--off pressors Tachycardia--reccurent Prolonged qtc Cariomyoopathy P:  Warmed HD 1/25, CVVHD 1/26>>>1/28 CE trending down: 2-->2.44-->2.31 Lactic acid down to 1.2 Cortisol 24.3--d/c steroids CVP 10 Dc a line  RENAL ESRD on HD, T,Th,Sat--apparently was to go out of town and received HD last Thursday and Friday Hyperkalemia--resolved Hyponatremia P:   Trend renal function panel in am  Renal following, On CRRT to dc Neg balance required  GASTROINTESTINAL At risk ischemic bowel - no evidence P:   PPI  TF  HEMATOLOGIC Anemia (dilutional, esrd) DVT prevention Thrombocytopenia--trending down P:  Trend CBC--Hb trending down Sub q heparin maintain  INFECTIOUS ?aspiration - likely source Sinusitis  Immunocompromised in past Sepsis likely (abdo source?), PCT down,  pressors down, no bloody stool or diarhea P:   Unasyn 1/25>>1/26 Vanc and Zosyn 1/26>>>1/28 Ceftriaxone 1/28>>> Blood cx x2 1/26>>NGTD sputum cultures 1/26: Moderate Enterobacter sensitive to cipro and pip/tazo, resistant to cefazolin Procal: 160.15-->104.98-->68.36  Narrow to ceftriaxone, plan total 8 days  ENDOCRINE Hyperglycemia R/o relAI--cortisol 24.3 - neg P:   ssi TF D/c stress dose steroids  NEUROLOGIC AMS - post cardiac arrest  Anoxic encephalopathy  P:   Coming down on fentanyl, responding to voice with eyes Warmed Repeat EEG for prognostication, may need neuro input May need CT for edema Needs longer prognostication time   FAMILY  - Updates:  Updated husband by  bedside 1/27 and 1/28  - Inter-disciplinary family meet or Palliative Care meeting due by:  2/1   Signed: Jerene Pitch, MD PGY-3, Internal Medicine Resident Pager: [phone]  11/03/2014,7:57 AM   STAFF NOTE: Linwood Dibbles, MD FACP have personally reviewed patient's available data, including medical history, events of note, physical examination and test results as part of my evaluation. I have discussed with resident/NP and other care providers such as pharmacist, RN and RRT. In addition, I personally evaluated patient and elicited key findings of: eeg repeat, weaning, keep rate 24, neuro clinical progress noted, I updated husband, to int hd, neg balance goals remain, narrow abx The patient is critically ill with multiple organ systems failure and requires high complexity decision making for assessment and support, frequent evaluation and titration of therapies, application of advanced monitoring technologies and extensive interpretation of multiple databases.   Critical Care Time devoted to patient care services described in this note is30 Minutes. This time reflects time of care of this signee: Merrie Roof, MD FACP. This critical care time does not reflect procedure time, or teaching  time or supervisory time of PA/NP/Med student/Med Resident etc but could involve care discussion time. Rest per NP/medical resident whose note is outlined above and that I agree with   Lavon Paganini. Titus Mould, MD, [place] Pgr: [place] [facility] 11/03/2014 10:12 AM

## 2014-11-04 ENCOUNTER — Inpatient Hospital Stay (HOSPITAL_COMMUNITY): Payer: Medicare Other

## 2014-11-04 DIAGNOSIS — I619 Nontraumatic intracerebral hemorrhage, unspecified: Secondary | ICD-10-CM

## 2014-11-04 LAB — GLUCOSE, CAPILLARY
GLUCOSE-CAPILLARY: 145 mg/dL — AB (ref 70–99)
GLUCOSE-CAPILLARY: 120 mg/dL — AB (ref 70–99)
Glucose-Capillary: 100 mg/dL — ABNORMAL HIGH (ref 70–99)
Glucose-Capillary: 139 mg/dL — ABNORMAL HIGH (ref 70–99)
Glucose-Capillary: 42 mg/dL — CL (ref 70–99)
Glucose-Capillary: 88 mg/dL (ref 70–99)
Glucose-Capillary: 94 mg/dL (ref 70–99)

## 2014-11-04 LAB — HEPATIC FUNCTION PANEL
ALBUMIN: 1.9 g/dL — AB (ref 3.5–5.2)
ALT: 19 U/L (ref 0–35)
AST: 20 U/L (ref 0–37)
Alkaline Phosphatase: 64 U/L (ref 39–117)
Bilirubin, Direct: <0.1 mg/dL (ref 0.0–0.5)
Total Bilirubin: 0.2 mg/dL — ABNORMAL LOW (ref 0.3–1.2)
Total Protein: 5.3 g/dL — ABNORMAL LOW (ref 6.0–8.3)

## 2014-11-04 LAB — BLOOD GAS, ARTERIAL
ACID-BASE DEFICIT: 1.5 mmol/L (ref 0.0–2.0)
Bicarbonate: 23.2 mEq/L (ref 20.0–24.0)
Drawn by: 10006
FIO2: 0.4 %
MECHVT: 500 mL
O2 Saturation: 92.9 %
PEEP: 5 cmH2O
Patient temperature: 98
RATE: 24 resp/min
TCO2: 24.6 mmol/L (ref 0–100)
pCO2 arterial: 42.4 mmHg (ref 35.0–45.0)
pH, Arterial: 7.356 (ref 7.350–7.450)
pO2, Arterial: 71.4 mmHg — ABNORMAL LOW (ref 80.0–100.0)

## 2014-11-04 LAB — BASIC METABOLIC PANEL
ANION GAP: 4 — AB (ref 5–15)
BUN: 66 mg/dL — ABNORMAL HIGH (ref 6–23)
CHLORIDE: 100 mmol/L (ref 96–112)
CO2: 30 mmol/L (ref 19–32)
Calcium: 8.6 mg/dL (ref 8.4–10.5)
Creatinine, Ser: 4.28 mg/dL — ABNORMAL HIGH (ref 0.50–1.10)
GFR calc Af Amer: 14 mL/min — ABNORMAL LOW (ref >90)
GFR calc non Af Amer: 12 mL/min — ABNORMAL LOW (ref >90)
GLUCOSE: 132 mg/dL — AB (ref 70–99)
Potassium: 5.1 mmol/L (ref 3.5–5.1)
Sodium: 134 mmol/L — ABNORMAL LOW (ref 135–145)

## 2014-11-04 LAB — CBC
HEMATOCRIT: 23.2 % — AB (ref 36.0–46.0)
HEMOGLOBIN: 7.4 g/dL — AB (ref 12.0–15.0)
MCH: 29.8 pg (ref 26.0–34.0)
MCHC: 31.9 g/dL (ref 30.0–36.0)
MCV: 93.5 fL (ref 78.0–100.0)
Platelets: 159 10*3/uL (ref 150–400)
RBC: 2.48 MIL/uL — AB (ref 3.87–5.11)
RDW: 16.3 % — AB (ref 11.5–15.5)
WBC: 19.9 10*3/uL — ABNORMAL HIGH (ref 4.0–10.5)

## 2014-11-04 LAB — RENAL FUNCTION PANEL
ALBUMIN: 2.5 g/dL — AB (ref 3.5–5.2)
ANION GAP: 8 (ref 5–15)
BUN: 76 mg/dL — AB (ref 6–23)
CO2: 27 mmol/L (ref 19–32)
Calcium: 8.6 mg/dL (ref 8.4–10.5)
Chloride: 100 mmol/L (ref 96–112)
Creatinine, Ser: 4.92 mg/dL — ABNORMAL HIGH (ref 0.50–1.10)
GFR calc non Af Amer: 10 mL/min — ABNORMAL LOW (ref >90)
GFR, EST AFRICAN AMERICAN: 12 mL/min — AB (ref >90)
GLUCOSE: 97 mg/dL (ref 70–99)
PHOSPHORUS: 4.3 mg/dL (ref 2.3–4.6)
POTASSIUM: 4.2 mmol/L (ref 3.5–5.1)
Sodium: 135 mmol/L (ref 135–145)

## 2014-11-04 LAB — MAGNESIUM: MAGNESIUM: 2.8 mg/dL — AB (ref 1.5–2.5)

## 2014-11-04 MED ORDER — DEXTROSE 50 % IV SOLN
INTRAVENOUS | Status: AC
  Filled 2014-11-04: qty 50

## 2014-11-04 MED ORDER — ACETAMINOPHEN 160 MG/5ML PO SOLN
650.0000 mg | Freq: Four times a day (QID) | ORAL | Status: DC | PRN
  Administered 2014-11-04: 650 mg
  Filled 2014-11-04: qty 20.3

## 2014-11-04 MED ORDER — HEPARIN SODIUM (PORCINE) 1000 UNIT/ML DIALYSIS
1000.0000 [IU] | INTRAMUSCULAR | Status: DC | PRN
  Administered 2014-11-07: 3000 [IU] via INTRAVENOUS_CENTRAL
  Filled 2014-11-04 (×2): qty 6
  Filled 2014-11-04: qty 5

## 2014-11-04 MED ORDER — PRISMASOL BGK 4/2.5 32-4-2.5 MEQ/L IV SOLN
INTRAVENOUS | Status: DC
  Administered 2014-11-04 – 2014-11-07 (×6): via INTRAVENOUS_CENTRAL
  Filled 2014-11-04 (×8): qty 5000

## 2014-11-04 MED ORDER — DEXTROSE 50 % IV SOLN
50.0000 mL | Freq: Once | INTRAVENOUS | Status: AC
Start: 2014-11-04 — End: 2014-11-04
  Administered 2014-11-04: 50 mL via INTRAVENOUS
  Filled 2014-11-04: qty 50

## 2014-11-04 MED ORDER — SODIUM CHLORIDE 0.9 % FOR CRRT
INTRAVENOUS_CENTRAL | Status: DC | PRN
  Filled 2014-11-04: qty 1000

## 2014-11-04 MED ORDER — PRISMASOL BGK 4/2.5 32-4-2.5 MEQ/L IV SOLN
INTRAVENOUS | Status: DC
  Administered 2014-11-04 – 2014-11-06 (×3): via INTRAVENOUS_CENTRAL
  Filled 2014-11-04 (×4): qty 5000

## 2014-11-04 MED ORDER — PRO-STAT SUGAR FREE PO LIQD
60.0000 mL | Freq: Three times a day (TID) | ORAL | Status: DC
  Administered 2014-11-04 – 2014-11-06 (×8): 60 mL
  Filled 2014-11-04 (×14): qty 60

## 2014-11-04 MED ORDER — NEPRO/CARBSTEADY PO LIQD
1000.0000 mL | ORAL | Status: DC
  Administered 2014-11-04 – 2014-11-06 (×3): 1000 mL
  Filled 2014-11-04 (×6): qty 1000

## 2014-11-04 MED ORDER — MIDAZOLAM HCL 2 MG/2ML IJ SOLN
1.0000 mg | INTRAMUSCULAR | Status: DC | PRN
  Administered 2014-11-04 (×2): 2 mg via INTRAVENOUS
  Filled 2014-11-04: qty 2

## 2014-11-04 MED ORDER — LACTULOSE 10 GM/15ML PO SOLN
10.0000 g | Freq: Once | ORAL | Status: AC
  Administered 2014-11-04: 10 g via ORAL
  Filled 2014-11-04: qty 15

## 2014-11-04 MED ORDER — ALTEPLASE 2 MG IJ SOLR
2.0000 mg | Freq: Once | INTRAMUSCULAR | Status: AC | PRN
  Filled 2014-11-04: qty 2

## 2014-11-04 MED ORDER — MIDAZOLAM HCL 2 MG/2ML IJ SOLN
INTRAMUSCULAR | Status: AC
  Administered 2014-11-04: 2 mg via INTRAVENOUS
  Filled 2014-11-04: qty 2

## 2014-11-04 MED ORDER — PIPERACILLIN-TAZOBACTAM 3.375 G IVPB
3.3750 g | Freq: Four times a day (QID) | INTRAVENOUS | Status: DC
  Administered 2014-11-04 – 2014-11-07 (×12): 3.375 g via INTRAVENOUS
  Filled 2014-11-04 (×14): qty 50

## 2014-11-04 MED ORDER — PRISMASOL BGK 0/2.5 32-2.5 MEQ/L IV SOLN
INTRAVENOUS | Status: DC
  Administered 2014-11-04 – 2014-11-05 (×8): via INTRAVENOUS_CENTRAL
  Filled 2014-11-04 (×15): qty 5000

## 2014-11-04 NOTE — Progress Notes (Signed)
PULMONARY / CRITICAL CARE MEDICINE   Name: [name] MRN: [id number] DOB: 1976/03/31    ADMISSION DATE:  10/31/2014  REFERRING MD :  EDP  CHIEF COMPLAINT:  Cardiac arrest   INITIAL PRESENTATION: 39 yo female with extensive PMH including HTN, ESRD on HD, previous TB, DM, kidney-pancreas transplant 2010, on home O2 with witnessed arrest at home.  Had her HD as regularly scheduled on 1/22.  Husband performed CPR.  Wide complex rhythm on EMS arrival.  Total ~1 hour CPR during multiple episodes, 7 epi, 6 defib.  K+ in ER was 8.1.  PCCM called to admit.   STUDIES:  CT head 1/25: ?acute sinusitis pCXR 1/25: cardiomegaly, increasing interstitial and airspace disease EEG 1/25: Anoxic encephalopathy Echo 1/26: severely reduced EF 25-30%, moderate LVH, grade 2 DD, PA pressure 84mHg, trivial pericardial effusion  SIGNIFICANT EVENTS: 1/26: admit s/p cardiac arrest, HD, hypothermia protocol, hypotensive during HD 1/26: CVVHD started 1/27: warmed 1/28: Following some commands  SUBJECTIVE: Lying in bed, sedated, on vent, off pressors. Sedation increased overnight for HTN.  Febrile this AM.   VITAL SIGNS: Temp:  [97.9 F (36.6 C)-101.7 F (38.7 C)] 101.7 F (38.7 C) (01/29 0700) Pulse Rate:  [91-125] 106 (01/29 0700) Resp:  [14-40] 24 (01/29 0700) BP: (87-230)/(18-128) 87/29 mmHg (01/29 0700) SpO2:  [73 %-100 %] 100 % (01/29 0700) Arterial Line BP: (113-196)/(47-71) 158/59 mmHg (01/28 1230) FiO2 (%):  [40 %-50 %] 40 % (01/29 0400) Weight:  [181 lb 7 oz (82.3 kg)] 181 lb 7 oz (82.3 kg) (01/29 0355) HEMODYNAMICS: CVP:  [7 mmHg-12 mmHg] 10 mmHg VENTILATOR SETTINGS: Vent Mode:  [-] PRVC FiO2 (%):  [40 %-50 %] 40 % Set Rate:  [24 bmp] 24 bmp Vt Set:  [500 mL] 500 mL PEEP:  [5 cmH20] 5 cmH20 Pressure Support:  [8 cmH20] 8 cmH20 Plateau Pressure:  [17 [id number]] 20 cmH20 INTAKE / OUTPUT:  Intake/Output Summary (Last 24 hours) at 11/04/14 0754 Last data filed at 11/04/14 0600  Gross  per 24 hour  Intake 1746.23 ml  Output    488 ml  Net 1258.23 ml   PHYSICAL EXAMINATION: General:  Lying in bed, on mechanical ventilation Neuro:  Sedated, RASS -2, did follow commands 1/28, not today HEENT: ETT in place Cardiovascular:  Tachycardia Lungs:  Decreased breath sounds at bases Abdomen:  +bs, soft Musculoskeletal:  Faint distal pulses. Edematous extremities Skin: Warm  LABS:  CBC  Recent Labs Lab 11/02/14 0500  11/02/14 1414 11/03/14 0400 11/04/14 0400  WBC 26.1*  --   --  18.9* 19.9*  HGB 8.5*  < > 8.2* 7.3* 7.4*  HCT 25.8*  < > 24.0* 23.0* 23.2*  PLT 149*  --   --  127* 159  < > = values in this interval not displayed. Coag's  Recent Labs Lab 10/31/14 0954 10/31/14 1055 10/31/14 1826 11/02/14 0500 11/03/14 0400  APTT 51* 148* 42* 46* 56*  INR 1.16 1.54* 1.25  --   --    BMET  Recent Labs Lab 11/02/14 1500 11/03/14 0400 11/04/14 0400  NA 134* 133* 134*  K 4.9 4.9 5.1  CL 100 101 100  CO2 '27 26 30  ' BUN 18 24* 66*  CREATININE 3.27* 2.74* 4.28*  GLUCOSE 142* 127* 132*   Electrolytes  Recent Labs Lab 11/01/14 1809  11/02/14 0500 11/02/14 1500 11/03/14 0400 11/04/14 0400  CALCIUM 8.2*  8.1*  < > 8.1* 7.8* 8.0* 8.6  MG  --   --  2.0  --  2.3 2.8*  PHOS 7.1*  --   --  4.3 4.5  --   < > = values in this interval not displayed. ABG  Recent Labs Lab 11/02/14 1140 11/03/14 0419 11/04/14 0505  PHART 7.323* 7.332* 7.356  PCO2ART 49.9* 50.1* 42.4  PO2ART 72.0* 126.0* 71.4*   Cardiac Enzymes  Recent Labs Lab 10/31/14 1826 10/31/14 2233 11/01/14 0405  TROPONINI 2.00* 2.44* 2.31*   Glucose  Recent Labs Lab 11/03/14 2010 11/04/14 0009 11/04/14 0328 11/04/14 0329 11/04/14 0353 11/04/14 0546  GLUCAP 132* 94 42* 42* 88 100*   Imaging Dg Chest Port 1 View  11/03/2014   CLINICAL DATA:  Acute respiratory failure  EXAM: PORTABLE CHEST - 1 VIEW  COMPARISON:  Yesterday  FINDINGS: Tubular device is stable. Diffuse edema  improved. Lung volumes are greater. More focal consolidation at the left base persists. Persistent central left upper lobe linear atelectasis. No pneumothorax.  IMPRESSION: Improved edema.  No pneumothorax.   Electronically Signed   By: Maryclare Bean M.D.   On: 11/03/2014 07:46   ASSESSMENT / PLAN:  PULMONARY OETT 1/25>>> VDRF - post cardiac arrest  ?aspiration PNA  Combined acidosis Hx TB  Pulm edema P:   Full vent support: 500/24/5/50%, rate reduction 20 will attempt cpap 5 ps 5 F/u CXR am, with cvvhd restart Neg balance goals further as goal to int hd  CARDIOVASCULAR CVL R fem 1/25>>>1/26 R fem aline 1/25>>>1/28 IJ  cvl left 1.26>>> Left fem hd 1/26>>>  Cardiac arrest - presumed r/t hyperkalemia in setting ESRD  Hx HTN  Cardiogenic shock--off pressors Tachycardia--reccurent Prolonged qtc Cariomyoopathy Edema Hypotension--this morning P:  HD 1/25, CVVHD 1/26>>>1/28 CE trending down: 2-->2.44-->2.31 Lactic acid down to 1.2 Cortisol 24.3--d/c steroids CVP 10 MAP goals met on own   RENAL ESRD on HD, T,Th,Sat--apparently was to go out of town and received HD last Thursday and Friday Hyperkalemia--resolved Hyponatremia P:   Trend renal function panel in am  Renal following Neg balance required Needs HD today, for cvvhd  GASTROINTESTINAL At risk ischemic bowel - no evidence P:   PPI  TF Needs BM dulc supp prn Add lactulose lft today  HEMATOLOGIC Anemia (dilutional, esrd) DVT prevention Thrombocytopenia--trending down P:  Trend CBC--for wbc Sub q heparin maintain  INFECTIOUS ?aspiration - likely source Sinusitis  Immunocompromised in past Sepsis likely (abdo source?), PCT down, pressors down, no bloody stool or diarhea Leukocytosis trending up, febrile 1/29 Enterobacter Fever 101 1/29 P:   Unasyn 1/25>>1/26 Vanc and Zosyn 1/26>>>1/28 Ceftriaxone 1/28>>> Blood cx x2 1/26>>NGTD sputum cultures 1/26: Moderate Enterobacter sensitive to cipro and  pip/tazo, resistant to cefazolin Procal: 160.15-->104.98-->68.36  Narrowed to Ceftriaxone 1/28 but febrile this morning 1/29 May consider back to polymicrobial zosyn Repeat BC Line look clean, may need change HD cath, follow BC  ENDOCRINE Hyperglycemia R/o relAI--cortisol 24.3 - neg P:   ssi TF  NEUROLOGIC AMS - post cardiac arrest  Anoxic encephalopathy Followed commands 1/28 P:   Come down on fentanyl wua daily   FAMILY  - Updates:  Updated husband by bedside 1/28  - Inter-disciplinary family meet or Palliative Care meeting due by:  2/1   Signed: Jerene Pitch, MD PGY-3, Internal Medicine Resident Pager: [phone]  11/04/2014,7:54 AM   STAFF NOTE: Linwood Dibbles, MD FACP have personally reviewed patient's available data, including medical history, events of note, physical examination and test results as part of my evaluation. I have discussed with resident/NP and other care providers  such as pharmacist, RN and RRT. In addition, I personally evaluated patient and elicited key findings of: fever, Blood cult, she did follow commands, now with some met enceph on top, back to zosyn for polymirobial, wean today, pcxr in am  The patient is critically ill with multiple organ systems failure and requires high complexity decision making for assessment and support, frequent evaluation and titration of therapies, application of advanced monitoring technologies and extensive interpretation of multiple databases.   Critical Care Time devoted to patient care services described in this note is 30 Minutes. This time reflects time of care of this signee: Merrie Roof, MD FACP. This critical care time does not reflect procedure time, or teaching time or supervisory time of PA/NP/Med student/Med Resident etc but could involve care discussion time. Rest per NP/medical resident whose note is outlined above and that I agree with   Lavon Paganini. Titus Mould, MD, [place] Pgr:  [place] [facility] 11/04/2014 11:36 AM

## 2014-11-04 NOTE — H&P (Addendum)
NUTRITION FOLLOW-UP  INTERVENTION: 1. Decrease Nepro to 30 ml/hr via OG tube  2. 60 ml Prostat TID    Tube feeding regimen provides 1896 kcal (104%  of needs), 148 grams of protein, and 523 ml of H2O.    NUTRITION DIAGNOSIS: Inadequate oral intake related to inability to eat as evidenced by NPO status; ongoing.   Goal:T Pt to meet >/= 90% of their estimated nutrition needs with tube feeding; not met.   Monitor:  TF goal met, tolerance, Labs, I/Os, weight  ASSESSMENT: 39 yo female with extensive PMH including HTN, ESRD, previous TB, DM, kidney-pancreas transplant 2010, on home O2 with witnessed arrest at home.  Patient is currently intubated on ventilator support MV: 12.3 L/min Temp (24hrs), Avg:98.9 F (37.2 C), Min:97.9 F (36.6 C), Max:101.7 F (38.7 C)  Pt discussed during ICU rounds and with RN.  CRRT stopped 1/28 but resumed today.   Height: Ht Readings from Last 1 Encounters:  10/31/14 '5\' 7"'  (1.702 m)    Weight: Wt Readings from Last 1 Encounters:  11/04/14 181 lb 7 oz (82.3 kg)  Admission weight: 173 lb (78.6 kg) 1/25  BMI:  Body mass index is 28.41 kg/(m^2).  Adjusted BW=78.7 kg Estimated Nutritional Needs: Kcal: 1822  Protein: 120-140 grams  Fluid: >1.2 L/day  Skin: WDL  Diet Order: NPO   Intake/Output Summary (Last 24 hours) at 11/04/14 1109 Last data filed at 11/04/14 0831  Gross per 24 hour  Intake 1555.42 ml  Output      0 ml  Net 1555.42 ml    Last BM: PTA  Labs:   Recent Labs Lab 11/01/14 1809  11/02/14 0500  11/02/14 1500 11/03/14 0400 11/04/14 0400  NA 134*  135  < > 134*  < > 134* 133* 134*  K 6.7*  6.7*  < > 4.9  < > 4.9 4.9 5.1  CL 95*  94*  < > 96  < > 100 101 100  CO2 31  26  < > 28  --  '27 26 30  ' BUN 30*  29*  < > 19  < > 18 24* 66*  CREATININE 5.82*  5.84*  < > 4.32*  < > 3.27* 2.74* 4.28*  CALCIUM 8.2*  8.1*  < > 8.1*  --  7.8* 8.0* 8.6  MG  --   --  2.0  --   --  2.3 2.8*  PHOS 7.1*  --   --   --  4.3  4.5  --   GLUCOSE 158*  158*  < > 105*  < > 142* 127* 132*  < > = values in this interval not displayed.  CBG (last 3)   Recent Labs  11/04/14 0353 11/04/14 0546 11/04/14 0753  GLUCAP 88 100* 145*    Scheduled Meds: . antiseptic oral rinse  7 mL Mouth Rinse QID  . aspirin  81 mg Oral Daily  . cefTRIAXone (ROCEPHIN)  IV  1 g Intravenous Q24H  . chlorhexidine  15 mL Mouth Rinse BID  . feeding supplement (NEPRO CARB STEADY)  1,000 mL Per Tube Q24H  . feeding supplement (PRO-STAT SUGAR FREE 64)  30 mL Per Tube TID  . insulin aspart  1-3 Units Subcutaneous 6 times per day  . insulin glargine  15 Units Subcutaneous Q24H  . pantoprazole (PROTONIX) IV  40 mg Intravenous Q24H    Continuous Infusions: . dextrose    . fentaNYL infusion INTRAVENOUS 25 mcg/hr (11/04/14 0800)  . norepinephrine (LEVOPHED)  Adult infusion Stopped (11/02/14 1813)  . dialysate (PRISMASATE)    . dialysis replacement fluid (prismasate)    . dialysis replacement fluid (prismasate)     Maylon Peppers RD, LDN, CNSC [phone] Pager [phone] After Hours Pager

## 2014-11-04 NOTE — Progress Notes (Signed)
Hypoglycemic Event  CBG: 42  Treatment: D50 IV 50 mL  Symptoms: None  Follow-up CBG: Time:0350 CBG Result:88  Possible Reasons for Event: Inadequate meal intake    Taiwan Talcott J  Remember to initiate Hypoglycemia Order Set & complete

## 2014-11-04 NOTE — Procedures (Signed)
ELECTROENCEPHALOGRAM REPORT  Date of Study: 11/04/2014  Patient's Name: [name] MRN: [id number] Date of Birth: Apr 09, 1976  Referring Provider: Dr. Jerene Pitch  Clinical History: This is a 39 year old woman with multiple medical problems, with out of hospital cardiac arrest s/p hypothermia protocol now normothermic.   Medications: aspirin chewable tablet 81 mg fentaNYL (SUBLIMAZE) 2,500 mcg in sodium chloride 0.9 % 250 mL (10 mcg/mL) infusion fentaNYL (SUBLIMAZE) bolus via infusion 50 mcg norepinephrine (LEVOPHED) 16 mg in dextrose 5 % 250 mL (0.064 mg/mL) infusion pantoprazole (PROTONIX) injection 40 mg piperacillin-tazobactam (ZOSYN) IVPB 3.375 g  Technical Summary: A multichannel digital EEG recording measured by the international 10-20 system with electrodes applied with paste and impedances below 5000 ohms performed as portable with EKG monitoring in an intubated and sedated patient.  Hyperventilation and photic stimulation were not performed.  The digital EEG was referentially recorded, reformatted, and digitally filtered in a variety of bipolar and referential montages for optimal display.   Description: The patient is intubated and sedated on Fentanyl during the recording. There is no clear posterior dominant rhythm. The background consists of a large amount of diffuse 4-5 Hz theta and 2-3 Hz delta slowing, at times sharply contoured without clear epileptogenic potential. There are occasional triphasic waves noted. .  During drowsiness and sleep, there is an increase in theta slowing of the background. Poorly formed vertex waves are seen. With noxious stimulation, there is slight increase in faster frequencies and muscle artifact. There were no epileptiform discharges or electrographic seizures seen.    EKG lead showed sinus tachycardia.  Impression: This sedated EEG is abnormal due to moderate diffuse slowing of the background with occasional triphasic waves seen.  Clinical  Correlation of the above findings indicates diffuse cerebral dysfunction that is non-specific in etiology and can be seen with hypoxic/ischemic injury, toxic/metabolic encephalopathies, or medication effect. Triphasic waves are typically seen with hepatic encephalopathy, but can be seen with other metabolic encephalopathies as well. There were no electrographic seizures in this study.     Ellouise Newer, M.D.

## 2014-11-04 NOTE — Progress Notes (Signed)
EEG completed, results pending. 

## 2014-11-04 NOTE — Progress Notes (Signed)
[facility] Progress Note   Subjective: not responding as much, CXR congestion, weights, 82 kg   Filed Vitals:   11/04/14 0530 11/04/14 0612 11/04/14 0700 11/04/14 0800  BP: 95/27 109/60 87/29 107/32  Pulse: 105 115 106 113  Temp:   101.7 F (38.7 C)   TempSrc:   Oral   Resp: 24 21 24 23   Height:      Weight:      SpO2: 100% 73% 100% 100%   Exam: Intubated, w ETT in place Facial edema No jvd Chest coarse BS bilat RRR tachy no MRG Abd soft, NTND Diffuse 1-2+ edema Neuro poorly responsive, on vent LUA AVG +bruit  CXR - vasc congestion 1/29  HD: TTS [place] 4h 72.5kg 2/2.25 [place] 400/A1.5 LUA AVG Heparin 2400 Aranesp 200 / wk, Hect 5 ug, Venofer 100/ hd thru 1/28   Assessment: 1. Cardiac arrest - likely due to hyperkalemia; s/p cooling protocol 2. Severe hyperkalemia - resolved 3. ESRD on HD TTS 4. Pulm edema - persistent 5. Anemia on max esa 6. HTN /vol 7. VDRF 8. Hx failed KP transplant  Plan - resume CRRT, max UF -200/hr, get vol down    Kelly Splinter MD  pager [phone]    cell [phone]  11/04/2014, 9:29 AM     Recent Labs Lab 11/01/14 1809  11/02/14 1500 11/03/14 0400 11/04/14 0400  NA 134*  135  < > 134* 133* 134*  K 6.7*  6.7*  < > 4.9 4.9 5.1  CL 95*  94*  < > 100 101 100  CO2 31  26  < > 27 26 30   GLUCOSE 158*  158*  < > 142* 127* 132*  BUN 30*  29*  < > 18 24* 66*  CREATININE 5.82*  5.84*  < > 3.27* 2.74* 4.28*  CALCIUM 8.2*  8.1*  < > 7.8* 8.0* 8.6  PHOS 7.1*  --  4.3 4.5  --   < > = values in this interval not displayed.  Recent Labs Lab 11/02/14 0500 11/02/14 1500 11/03/14 0400  AST 46*  --   --   ALT 52*  --   --   ALKPHOS 63  --   --   BILITOT 0.8  --   --   PROT 6.4  --   --   ALBUMIN 2.7* 2.4* 2.5*    Recent Labs Lab 11/02/14 0500  11/02/14 1414 11/03/14 0400 11/04/14 0400  WBC 26.1*  --   --  18.9* 19.9*  NEUTROABS 24.8*  --   --  17.8*  --   HGB 8.5*  < > 8.2* 7.3* 7.4*   HCT 25.8*  < > 24.0* 23.0* 23.2*  MCV 91.5  --   --  92.4 93.5  PLT 149*  --   --  127* 159  < > = values in this interval not displayed. [name] antiseptic oral rinse  7 mL Mouth Rinse QID  . aspirin  81 mg Oral Daily  . cefTRIAXone (ROCEPHIN)  IV  1 g Intravenous Q24H  . chlorhexidine  15 mL Mouth Rinse BID  . feeding supplement (NEPRO CARB STEADY)  1,000 mL Per Tube Q24H  . feeding supplement (PRO-STAT SUGAR FREE 64)  30 mL Per Tube TID  . insulin aspart  1-3 Units Subcutaneous 6 times per day  . insulin glargine  15 Units Subcutaneous Q24H  . pantoprazole (PROTONIX) IV  40 mg Intravenous Q24H   . dextrose    .  fentaNYL infusion INTRAVENOUS 25 mcg/hr (11/04/14 0800)  . norepinephrine (LEVOPHED) Adult infusion Stopped (11/02/14 1813)   sodium chloride, acetaminophen (TYLENOL) oral liquid 160 mg/5 mL, dextrose, fentaNYL, metoprolol, midazolam, [START ON 11/07/2014] pneumococcal 23 valent vaccine

## 2014-11-04 NOTE — Progress Notes (Signed)
ANTIBIOTIC CONSULT NOTE - INITIAL  Pharmacy Consult for zosyn Indication: PNA  Allergies  Allergen Reactions  . Ace Inhibitors Cough    Patient Measurements: Height: 5\' 7"  (170.2 cm) Weight: 181 lb 7 oz (82.3 kg) IBW/kg (Calculated) : 61.6  Vital Signs: Temp: 101.3 F (38.5 C) (01/29 1200) Temp Source: Oral (01/29 1200) BP: 93/34 mmHg (01/29 1200) Pulse Rate: 93 (01/29 1200) Intake/Output from previous day: 01/28 0701 - 01/29 0700 In: 1798.7 [I.V.:343.7; NG/GT:1405; IV Piggyback:50] Out: 488  Intake/Output from this shift: Total I/O In: 92.5 [I.V.:7.5; NG/GT:35; IV Piggyback:50] Out: -   Labs:  Recent Labs  11/02/14 0500  11/02/14 1414 11/02/14 1500 11/03/14 0400 11/04/14 0400  WBC 26.1*  --   --   --  18.9* 19.9*  HGB 8.5*  < > 8.2*  --  7.3* 7.4*  PLT 149*  --   --   --  127* 159  CREATININE 4.32*  < > 3.10* 3.27* 2.74* 4.28*  < > = values in this interval not displayed. Estimated Creatinine Clearance: 19.7 mL/min (by C-G formula based on Cr of 4.28). No results for input(s): VANCOTROUGH, VANCOPEAK, VANCORANDOM, GENTTROUGH, GENTPEAK, GENTRANDOM, TOBRATROUGH, TOBRAPEAK, TOBRARND, AMIKACINPEAK, AMIKACINTROU, AMIKACIN in the last 72 hours.   Microbiology: Recent Results (from the past 720 hour(s))  MRSA PCR Screening     Status: None   Collection Time: 10/31/14 11:33 AM  Result Value Ref Range Status   MRSA by PCR NEGATIVE NEGATIVE Final    Comment:        The GeneXpert MRSA Assay (FDA approved for NASAL specimens only), is one component of a comprehensive MRSA colonization surveillance program. It is not intended to diagnose MRSA infection nor to guide or monitor treatment for MRSA infections.   Culture, blood (routine x 2)     Status: None (Preliminary result)   Collection Time: 11/01/14 11:00 AM  Result Value Ref Range Status   Specimen Description BLOOD RIGHT ARM  Final   Special Requests BOTTLES DRAWN AEROBIC ONLY 5CC  Final   Culture   Final            BLOOD CULTURE RECEIVED NO GROWTH TO DATE CULTURE WILL BE HELD FOR 5 DAYS BEFORE ISSUING A FINAL NEGATIVE REPORT Performed at [facility]    Report Status PENDING  Incomplete  Culture, blood (routine x 2)     Status: None (Preliminary result)   Collection Time: 11/01/14 11:10 AM  Result Value Ref Range Status   Specimen Description BLOOD RIGHT HAND  Final   Special Requests BOTTLES DRAWN AEROBIC ONLY 3CC  Final   Culture   Final           BLOOD CULTURE RECEIVED NO GROWTH TO DATE CULTURE WILL BE HELD FOR 5 DAYS BEFORE ISSUING A FINAL NEGATIVE REPORT Performed at [facility]    Report Status PENDING  Incomplete  Culture, respiratory (NON-Expectorated)     Status: None   Collection Time: 11/01/14  3:10 PM  Result Value Ref Range Status   Specimen Description TRACHEAL ASPIRATE  Final   Special Requests NONE  Final   Gram Stain   Final    ABUNDANT WBC PRESENT, PREDOMINANTLY PMN RARE SQUAMOUS EPITHELIAL CELLS PRESENT RARE GRAM POSITIVE COCCI IN PAIRS RARE GRAM NEGATIVE RODS Performed at [facility]    Culture   Final    MODERATE ENTEROBACTER CLOACAE Performed at [facility]    Report Status 11/03/2014 FINAL  Final   Organism ID,  Bacteria ENTEROBACTER CLOACAE  Final      Susceptibility   Enterobacter cloacae - MIC*    CEFAZOLIN >=64 RESISTANT Resistant     CEFEPIME <=1 SENSITIVE Sensitive     CEFTAZIDIME <=1 SENSITIVE Sensitive     CEFTRIAXONE <=1 SENSITIVE Sensitive     CIPROFLOXACIN <=0.25 SENSITIVE Sensitive     GENTAMICIN <=1 SENSITIVE Sensitive     IMIPENEM 0.5 SENSITIVE Sensitive     PIP/TAZO <=4 SENSITIVE Sensitive     TOBRAMYCIN <=1 SENSITIVE Sensitive     TRIMETH/SULFA <=20 SENSITIVE Sensitive     * MODERATE ENTEROBACTER CLOACAE    Medical History: Past Medical History  Diagnosis Date  . HTN (hypertension)     not well controlled  . TB (pulmonary tuberculosis) 2003, 2007    history of miliary TB partially treated in  2003, and then completely treated in 2007  . History of blood transfusion     "lots of times" (01/27/2013)  . CAP (community acquired pneumonia) 2010  . Type II diabetes mellitus   . Anemia   . Ovarian tumor, malignant 2005    resected in [place]' pt denies that this was cancer on 01/27/2013  . Diabetic retinopathy   . On home oxygen therapy     "5L; 24/7" (01/25/2014)  . ESRD on hemodialysis 08/25/2013    Started HD in 2008.  Had [place] transplant in 2010 at [facility].  Went back on HD in 2012.  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  The kidney was left in.  She now gets HD at [facility] on TTS schedule.     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  Both organs have since rejected, see "ESRD" for details.     Medications:  Scheduled:  . antiseptic oral rinse  7 mL Mouth Rinse QID  . aspirin  81 mg Oral Daily  . cefTRIAXone (ROCEPHIN)  IV  1 g Intravenous Q24H  . chlorhexidine  15 mL Mouth Rinse BID  . feeding supplement (NEPRO CARB STEADY)  1,000 mL Per Tube Q24H  . feeding supplement (PRO-STAT SUGAR FREE 64)  60 mL Per Tube TID  . insulin aspart  1-3 Units Subcutaneous 6 times per day  . insulin glargine  15 Units Subcutaneous Q24H  . lactulose  10 g Oral Once  . pantoprazole (PROTONIX) IV  40 mg Intravenous Q24H    Assessment: 39 yo female s/p arrest completed hypothermia protocol. Currently on ceftriaxone for enterobacter pneumonia. Fever of 101.3 noted this am, wbc trending up to 19.9. Orders to broaden antibiotics back to zosyn. Noted that CRRT is being resumed this morning so will adjust dose accordingly.  1/25 unasyn>> 1/26 1/26 vanc>1/28 1/26 zosyn>>1/28 1/28 ceftriaxone>> (2/2)   1/26 blood x2-ngtd 1/26 TA - ENTEROBACTER CLOACAE -sensitive to zosyn  Goal of Therapy:  Vancomycin trough level 15-20 mcg/ml  Plan:  -Zosyn 3.375g IV q6h -d/c ceftriaxone  Erin Hearing PharmD., BCPS Clinical  Pharmacist Pager [phone] 11/04/2014 12:46 PM

## 2014-11-05 ENCOUNTER — Inpatient Hospital Stay (HOSPITAL_COMMUNITY): Payer: Medicare Other

## 2014-11-05 LAB — RENAL FUNCTION PANEL
Albumin: 2.4 g/dL — ABNORMAL LOW (ref 3.5–5.2)
Anion gap: 10 (ref 5–15)
BUN: 50 mg/dL — AB (ref 6–23)
CO2: 27 mmol/L (ref 19–32)
Calcium: 8.6 mg/dL (ref 8.4–10.5)
Chloride: 98 mmol/L (ref 96–112)
Creatinine, Ser: 3.21 mg/dL — ABNORMAL HIGH (ref 0.50–1.10)
GFR calc Af Amer: 20 mL/min — ABNORMAL LOW (ref >90)
GFR, EST NON AFRICAN AMERICAN: 17 mL/min — AB (ref >90)
GLUCOSE: 145 mg/dL — AB (ref 70–99)
Phosphorus: 4 mg/dL (ref 2.3–4.6)
Potassium: 3.3 mmol/L — ABNORMAL LOW (ref 3.5–5.1)
Sodium: 135 mmol/L (ref 135–145)

## 2014-11-05 LAB — CBC WITH DIFFERENTIAL/PLATELET
BASOS ABS: 0 10*3/uL (ref 0.0–0.1)
Basophils Relative: 0 % (ref 0–1)
EOS ABS: 0.3 10*3/uL (ref 0.0–0.7)
EOS PCT: 2 % (ref 0–5)
HCT: 24.5 % — ABNORMAL LOW (ref 36.0–46.0)
Hemoglobin: 7.9 g/dL — ABNORMAL LOW (ref 12.0–15.0)
Lymphocytes Relative: 10 % — ABNORMAL LOW (ref 12–46)
Lymphs Abs: 1.4 10*3/uL (ref 0.7–4.0)
MCH: 29.5 pg (ref 26.0–34.0)
MCHC: 32.2 g/dL (ref 30.0–36.0)
MCV: 91.4 fL (ref 78.0–100.0)
MONOS PCT: 5 % (ref 3–12)
Monocytes Absolute: 0.7 10*3/uL (ref 0.1–1.0)
NEUTROS ABS: 11.9 10*3/uL — AB (ref 1.7–7.7)
Neutrophils Relative %: 83 % — ABNORMAL HIGH (ref 43–77)
Platelets: 175 10*3/uL (ref 150–400)
RBC: 2.68 MIL/uL — ABNORMAL LOW (ref 3.87–5.11)
RDW: 16.7 % — ABNORMAL HIGH (ref 11.5–15.5)
WBC: 14.3 10*3/uL — AB (ref 4.0–10.5)

## 2014-11-05 LAB — COMPREHENSIVE METABOLIC PANEL
ALK PHOS: 82 U/L (ref 39–117)
ALT: 23 U/L (ref 0–35)
ANION GAP: 8 (ref 5–15)
AST: 26 U/L (ref 0–37)
Albumin: 2.6 g/dL — ABNORMAL LOW (ref 3.5–5.2)
BILIRUBIN TOTAL: 0.6 mg/dL (ref 0.3–1.2)
BUN: 57 mg/dL — AB (ref 6–23)
CO2: 27 mmol/L (ref 19–32)
CREATININE: 3.65 mg/dL — AB (ref 0.50–1.10)
Calcium: 8.7 mg/dL (ref 8.4–10.5)
Chloride: 98 mmol/L (ref 96–112)
GFR calc non Af Amer: 15 mL/min — ABNORMAL LOW (ref >90)
GFR, EST AFRICAN AMERICAN: 17 mL/min — AB (ref >90)
GLUCOSE: 154 mg/dL — AB (ref 70–99)
Potassium: 3.6 mmol/L (ref 3.5–5.1)
Sodium: 133 mmol/L — ABNORMAL LOW (ref 135–145)
Total Protein: 7.5 g/dL (ref 6.0–8.3)

## 2014-11-05 LAB — BLOOD GAS, ARTERIAL
Acid-Base Excess: 0 mmol/L (ref 0.0–2.0)
Bicarbonate: 24.9 mEq/L — ABNORMAL HIGH (ref 20.0–24.0)
Drawn by: 25203
FIO2: 0.4 %
LHR: 20 {breaths}/min
MECHVT: 500 mL
O2 Saturation: 97.3 %
PCO2 ART: 46.1 mmHg — AB (ref 35.0–45.0)
PEEP: 5 cmH2O
Patient temperature: 98.6
TCO2: 26.3 mmol/L (ref 0–100)
pH, Arterial: 7.352 (ref 7.350–7.450)
pO2, Arterial: 103 mmHg — ABNORMAL HIGH (ref 80.0–100.0)

## 2014-11-05 LAB — APTT: aPTT: 37 seconds (ref 24–37)

## 2014-11-05 LAB — GLUCOSE, CAPILLARY
GLUCOSE-CAPILLARY: 155 mg/dL — AB (ref 70–99)
GLUCOSE-CAPILLARY: 145 mg/dL — AB (ref 70–99)
GLUCOSE-CAPILLARY: 126 mg/dL — AB (ref 70–99)
GLUCOSE-CAPILLARY: 191 mg/dL — AB (ref 70–99)
Glucose-Capillary: 134 mg/dL — ABNORMAL HIGH (ref 70–99)
Glucose-Capillary: 147 mg/dL — ABNORMAL HIGH (ref 70–99)
Glucose-Capillary: 180 mg/dL — ABNORMAL HIGH (ref 70–99)

## 2014-11-05 LAB — MAGNESIUM: MAGNESIUM: 2.8 mg/dL — AB (ref 1.5–2.5)

## 2014-11-05 LAB — CLOSTRIDIUM DIFFICILE BY PCR: Toxigenic C. Difficile by PCR: NEGATIVE

## 2014-11-05 MED ORDER — DARBEPOETIN ALFA 200 MCG/0.4ML IJ SOSY
200.0000 ug | PREFILLED_SYRINGE | Freq: Once | INTRAMUSCULAR | Status: AC
  Administered 2014-11-05: 200 ug via INTRAVENOUS
  Filled 2014-11-05: qty 0.4

## 2014-11-05 MED ORDER — DOXERCALCIFEROL 2.5 MCG PO CAPS
3.0000 ug | ORAL_CAPSULE | ORAL | Status: DC
  Filled 2014-11-05 (×3): qty 1

## 2014-11-05 MED ORDER — POTASSIUM CHLORIDE 20 MEQ/15ML (10%) PO SOLN
30.0000 meq | Freq: Four times a day (QID) | ORAL | Status: AC
  Administered 2014-11-05 (×2): 30 meq via ORAL
  Filled 2014-11-05 (×2): qty 22.5

## 2014-11-05 MED ORDER — DARBEPOETIN ALFA 200 MCG/0.4ML IJ SOSY: 200.0000 ug | PREFILLED_SYRINGE | INTRAMUSCULAR | Status: DC

## 2014-11-05 MED ORDER — POTASSIUM CHLORIDE CRYS ER 20 MEQ PO TBCR
30.0000 meq | EXTENDED_RELEASE_TABLET | Freq: Three times a day (TID) | ORAL | Status: DC
  Administered 2014-11-05: 30 meq via ORAL
  Filled 2014-11-05 (×2): qty 1

## 2014-11-05 MED ORDER — PRISMASOL BGK 4/2.5 32-4-2.5 MEQ/L IV SOLN
INTRAVENOUS | Status: DC
  Administered 2014-11-05 – 2014-11-07 (×11): via INTRAVENOUS_CENTRAL
  Filled 2014-11-05 (×17): qty 5000

## 2014-11-05 MED ORDER — SODIUM CHLORIDE 0.9 % IV SOLN
62.5000 mg | INTRAVENOUS | Status: DC
  Filled 2014-11-05: qty 5

## 2014-11-05 NOTE — Progress Notes (Signed)
[name] Progress Note   Subjective: removing fluid -175/hr, CXR still wet, a little better, wt down 79kg  Filed Vitals:   11/05/14 1100 11/05/14 1102 11/05/14 1200 11/05/14 1300  BP: 140/64  213/74 104/37  Pulse: 87  103 84  Temp:  98.6 F (37 C)    TempSrc:  Oral    Resp: 18  17 16   Height:      Weight:      SpO2: 100%  100% 100%   Exam: Intubated, w ETT in place Facial edema improving No jvd Chest coarse BS bilat RRR tachy no MRG Abd soft, NTND Diffuse 1-2+ edema Neuro sedated, on vent LUA AVG +bruit  CXR - vasc congestion 1/29  HD: TTS [place] 4h 72.5kg 2/2.25 Bath 400/A1.5 LUA AVG Heparin 2400 Aranesp 200 / wk, Hect 5 ug, Venofer 100/ hd thru 1/28   Assessment: 1. Cardiac arrest /severe hyperkalemia - s/p cooling protocol, high K resolved 2. ESRD on HD TTS 3. Pulm edema / vol excess - improving now 4. Anemia cont max esa, weekly IV Fe 50 5. VDRF 6. 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  Plan - cont CRRT, increase UF 200-225cc/ hr    Kelly Splinter MD  pager [phone]    cell [phone]  11/05/2014, 1:46 PM     Recent Labs Lab 11/02/14 1500 11/03/14 0400 11/04/14 0400 11/04/14 1512 11/05/14 0430  NA 134* 133* 134* 135 133*  K 4.9 4.9 5.1 4.2 3.6  CL 100 101 100 100 98  CO2 27 26 30 27 27   GLUCOSE 142* 127* 132* 97 154*  BUN 18 24* 66* 76* 57*  CREATININE 3.27* 2.74* 4.28* 4.92* 3.65*  CALCIUM 7.8* 8.0* 8.6 8.6 8.7  PHOS 4.3 4.5  --  4.3  --     Recent Labs Lab 11/02/14 0500  11/04/14 1200 11/04/14 1512 11/05/14 0430  AST 46*  --  20  --  26  ALT 52*  --  19  --  23  ALKPHOS 63  --  64  --  82  BILITOT 0.8  --  0.2*  --  0.6  PROT 6.4  --  5.3*  --  7.5  ALBUMIN 2.7*  < > 1.9* 2.5* 2.6*  < > = values in this interval not displayed.  Recent Labs Lab 11/02/14 0500  11/03/14 0400 11/04/14 0400 11/05/14 0430  WBC 26.1*  --  18.9* 19.9* 14.3*  NEUTROABS 24.8*  --  17.8*  --  11.9*   HGB 8.5*  < > 7.3* 7.4* 7.9*  HCT 25.8*  < > 23.0* 23.2* 24.5*  MCV 91.5  --  92.4 93.5 91.4  PLT 149*  --  127* 159 175  < > = values in this interval not displayed. [name] antiseptic oral rinse  7 mL Mouth Rinse QID  . aspirin  81 mg Oral Daily  . chlorhexidine  15 mL Mouth Rinse BID  . feeding supplement (NEPRO CARB STEADY)  1,000 mL Per Tube Q24H  . feeding supplement (PRO-STAT SUGAR FREE 64)  60 mL Per Tube TID  . insulin aspart  1-3 Units Subcutaneous 6 times per day  . insulin glargine  15 Units Subcutaneous Q24H  . pantoprazole (PROTONIX) IV  40 mg Intravenous Q24H  . piperacillin-tazobactam (ZOSYN)  IV  3.375 g Intravenous 4 times per day   . dextrose    . fentaNYL infusion INTRAVENOUS 25 mcg/hr (11/05/14 1300)  . dialysate (  PRISMASATE) 1,500 mL/hr at 11/05/14 1325  . dialysis replacement fluid (prismasate) 400 mL/hr at 11/05/14 0106  . dialysis replacement fluid (prismasate) 200 mL/hr at 11/04/14 1236   sodium chloride, acetaminophen (TYLENOL) oral liquid 160 mg/5 mL, dextrose, fentaNYL, heparin, metoprolol, [START ON 11/07/2014] pneumococcal 23 valent vaccine, sodium chloride

## 2014-11-05 NOTE — Progress Notes (Signed)
PULMONARY / CRITICAL CARE MEDICINE   Name: [name] MRN: [id number] DOB: 03-06-1976    ADMISSION DATE:  10/31/2014  REFERRING MD :  EDP  CHIEF COMPLAINT:  Cardiac arrest   INITIAL PRESENTATION: 39 yo female with extensive PMH including HTN, ESRD on HD, previous TB, DM, kidney-pancreas transplant 2010, on home O2 with witnessed arrest at home.  Had her HD as regularly scheduled on 1/22.  Husband performed CPR.  Wide complex rhythm on EMS arrival.  Total ~1 hour CPR during multiple episodes, 7 epi, 6 defib.  K+ in ER was 8.1.  PCCM called to admit.   STUDIES:  CT head 1/25: ?acute sinusitis pCXR 1/25: cardiomegaly, increasing interstitial and airspace disease EEG 1/25: Anoxic encephalopathy Echo 1/26: severely reduced EF 25-30%, moderate LVH, grade 2 DD, PA pressure 57mmHg, trivial pericardial effusion eeg 1/29>>>  SIGNIFICANT EVENTS: 1/26: admit s/p cardiac arrest, HD, hypothermia protocol, hypotensive during HD 1/26: CVVHD started 1/27: warmed 1/28: Following some commands 1/29 fevers, zosyn restarted  SUBJECTIVE: follows commands, no further fevers  VITAL SIGNS: Temp:  [97.7 F (36.5 C)-101.3 F (38.5 C)] 98.6 F (37 C) (01/30 0800) Pulse Rate:  [80-119] 87 (01/30 0800) Resp:  [15-33] 17 (01/30 0800) BP: (85-151)/(19-92) 113/45 mmHg (01/30 0800) SpO2:  [97 %-100 %] 100 % (01/30 0800) FiO2 (%):  [40 %] 40 % (01/30 0719) Weight:  [79.6 kg (175 lb 7.8 oz)] 79.6 kg (175 lb 7.8 oz) (01/30 0428) HEMODYNAMICS: CVP:  [3 mmHg-10 mmHg] 3 mmHg VENTILATOR SETTINGS: Vent Mode:  [-] AC FiO2 (%):  [40 %] 40 % Set Rate:  [20 bmp] 20 bmp Vt Set:  [500 mL] 500 mL PEEP:  [5 cmH20] 5 cmH20 Plateau Pressure:  [13 BPZ02-58 cmH20] 13 cmH20 INTAKE / OUTPUT:  Intake/Output Summary (Last 24 hours) at 11/05/14 0910 Last data filed at 11/05/14 0900  Gross per 24 hour  Intake 1225.98 ml  Output   4351 ml  Net -3125.02 ml   PHYSICAL EXAMINATION: General:  Lying in bed, on mechanical  ventilation Neuro:  Sedated, RASS -1,  follow commands HEENT: ETT in place Cardiovascular: s1 s2 RRR Lungs:  Decreased breath sounds at bases, crackles Abdomen:  +bs, soft Musculoskeletal:  Present distal pulses. Edematous extremities Skin: Warm  LABS:  CBC  Recent Labs Lab 11/03/14 0400 11/04/14 0400 11/05/14 0430  WBC 18.9* 19.9* 14.3*  HGB 7.3* 7.4* 7.9*  HCT 23.0* 23.2* 24.5*  PLT 127* 159 175   Coag's  Recent Labs Lab 10/31/14 0954 10/31/14 1055 10/31/14 1826 11/02/14 0500 11/03/14 0400 11/05/14 0430  APTT 51* 148* 42* 46* 56* 37  INR 1.16 1.54* 1.25  --   --   --    BMET  Recent Labs Lab 11/04/14 0400 11/04/14 1512 11/05/14 0430  NA 134* 135 133*  K 5.1 4.2 3.6  CL 100 100 98  CO2 30 27 27   BUN 66* 76* 57*  CREATININE 4.28* 4.92* 3.65*  GLUCOSE 132* 97 154*   Electrolytes  Recent Labs Lab 11/02/14 1500 11/03/14 0400 11/04/14 0400 11/04/14 1512 11/05/14 0430  CALCIUM 7.8* 8.0* 8.6 8.6 8.7  MG  --  2.3 2.8*  --  2.8*  PHOS 4.3 4.5  --  4.3  --    ABG  Recent Labs Lab 11/03/14 0419 11/04/14 0505 11/05/14 0455  PHART 7.332* 7.356 7.352  PCO2ART 50.1* 42.4 46.1*  PO2ART 126.0* 71.4* 103.0*   Cardiac Enzymes  Recent Labs Lab 10/31/14 1826 10/31/14 2233 11/01/14 0405  TROPONINI 2.00* 2.44* 2.31*   Glucose  Recent Labs Lab 11/04/14 1124 11/04/14 1630 11/04/14 1946 11/05/14 0003 11/05/14 0426 11/05/14 0746  GLUCAP 139* 120* 134* 147* 155* 145*   Imaging Dg Chest Port 1 View  11/04/2014   CLINICAL DATA:  Acute respiratory failure  EXAM: PORTABLE CHEST - 1 VIEW  COMPARISON:  11/03/2014  FINDINGS: Cardiac shadow is stable. Endotracheal tube is noted 3 cm above the carina. A nasogastric catheter is seen within the stomach. The left jugular line is again noted in the proximal superior vena cava. Diffuse pulmonary edema is again identified. Increasing density over the bases bilaterally suggest bilateral effusions. No bony  abnormality is noted.  IMPRESSION: Persistent vascular congestion with increasing effusions bilaterally.   Electronically Signed   By: Inez Catalina M.D.   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 tele  RENAL ESRD on HD, T,Th,Sat--apparently was to go out of town and received HD last Thursday and Friday Hyperkalemia--resolved Hyponatremia P:   cvvhd , was neg 2.3 liters, would have same goals next 24 hrs bmet in am  kvo  GASTROINTESTINAL At risk ischemic bowel - no evidence BM noted 1/29 P:   PPI  TF dulc supp prn Responded to lactulose  HEMATOLOGIC Anemia (dilutional, esrd) DVT prevention Thrombocytopenia resolving P:  Sub q heparin maintain i spoke to renal about epo  INFECTIOUS ?aspiration - likely source Sinusitis  Immunocompromised in past Sepsis likely (abdo source?), PCT down, pressors down, no bloody stool or diarhea Leukocytosis trending up, febrile 1/29 Enterobacter Fever 101 1/29 P:   Unasyn 1/25>>1/26 Vanc and Zosyn 1/26>>>1/28 Ceftriaxone 1/28>>> Blood cx x2 1/26>>NGTD BC 1/29>>> sputum cultures 1/26: Moderate Enterobacter sensitive to cipro and pip/tazo, resistant to cefazolin Procal: 160.15-->104.98-->68.36  Follow repeat bc No further temps noted Maintain zosyn, likely will need  to use this as the non narrowed treatment as fevers resolve with restart  ENDOCRINE Hyperglycemia R/o relAI--cortisol 24.3 - neg P:   ssi TF  NEUROLOGIC AMS - post cardiac arrest  Anoxic encephalopathy Followed commands 1/28, 1/30 P:   Come down on fentanyl to off as goal wua daily Follow eeg  FAMILY  - Updates:  Updated husband by bedside daily, 1/30 included  - Inter-disciplinary family meet or Palliative Care meeting due by:  2/1  Ccm time 30 min   Lavon Paganini. Titus Mould, MD, [place] Pgr: [facility] 11/05/2014 9:10 AM

## 2014-11-06 ENCOUNTER — Inpatient Hospital Stay (HOSPITAL_COMMUNITY): Payer: Medicare Other

## 2014-11-06 LAB — COMPREHENSIVE METABOLIC PANEL
ALT: 20 U/L (ref 0–35)
ANION GAP: 9 (ref 5–15)
AST: 17 U/L (ref 0–37)
Albumin: 2.6 g/dL — ABNORMAL LOW (ref 3.5–5.2)
Alkaline Phosphatase: 69 U/L (ref 39–117)
BUN: 46 mg/dL — ABNORMAL HIGH (ref 6–23)
CO2: 26 mmol/L (ref 19–32)
CREATININE: 2.74 mg/dL — AB (ref 0.50–1.10)
Calcium: 8.6 mg/dL (ref 8.4–10.5)
Chloride: 99 mmol/L (ref 96–112)
GFR calc Af Amer: 24 mL/min — ABNORMAL LOW (ref >90)
GFR, EST NON AFRICAN AMERICAN: 21 mL/min — AB (ref >90)
GLUCOSE: 174 mg/dL — AB (ref 70–99)
POTASSIUM: 3.8 mmol/L (ref 3.5–5.1)
Sodium: 134 mmol/L — ABNORMAL LOW (ref 135–145)
Total Bilirubin: 0.6 mg/dL (ref 0.3–1.2)
Total Protein: 7.9 g/dL (ref 6.0–8.3)

## 2014-11-06 LAB — RENAL FUNCTION PANEL
Albumin: 2.8 g/dL — ABNORMAL LOW (ref 3.5–5.2)
Anion gap: 18 — ABNORMAL HIGH (ref 5–15)
BUN: 36 mg/dL — ABNORMAL HIGH (ref 6–23)
CO2: 22 mmol/L (ref 19–32)
Calcium: 8.5 mg/dL (ref 8.4–10.5)
Chloride: 96 mmol/L (ref 96–112)
Creatinine, Ser: 2.5 mg/dL — ABNORMAL HIGH (ref 0.50–1.10)
GFR calc Af Amer: 27 mL/min — ABNORMAL LOW
GFR calc non Af Amer: 23 mL/min — ABNORMAL LOW
Glucose, Bld: 210 mg/dL — ABNORMAL HIGH (ref 70–99)
Phosphorus: 3.2 mg/dL (ref 2.3–4.6)
Potassium: 3.8 mmol/L (ref 3.5–5.1)
Sodium: 136 mmol/L (ref 135–145)

## 2014-11-06 LAB — MAGNESIUM: Magnesium: 2.7 mg/dL — ABNORMAL HIGH (ref 1.5–2.5)

## 2014-11-06 LAB — GLUCOSE, CAPILLARY
GLUCOSE-CAPILLARY: 167 mg/dL — AB (ref 70–99)
Glucose-Capillary: 159 mg/dL — ABNORMAL HIGH (ref 70–99)
Glucose-Capillary: 167 mg/dL — ABNORMAL HIGH (ref 70–99)
Glucose-Capillary: 178 mg/dL — ABNORMAL HIGH (ref 70–99)
Glucose-Capillary: 179 mg/dL — ABNORMAL HIGH (ref 70–99)
Glucose-Capillary: 194 mg/dL — ABNORMAL HIGH (ref 70–99)

## 2014-11-06 LAB — POCT I-STAT 3, ART BLOOD GAS (G3+)
ACID-BASE DEFICIT: 1 mmol/L (ref 0.0–2.0)
Bicarbonate: 25.1 mEq/L — ABNORMAL HIGH (ref 20.0–24.0)
O2 SAT: 98 %
TCO2: 26 mmol/L (ref 0–100)
pCO2 arterial: 44.5 mmHg (ref 35.0–45.0)
pH, Arterial: 7.36 (ref 7.350–7.450)
pO2, Arterial: 118 mmHg — ABNORMAL HIGH (ref 80.0–100.0)

## 2014-11-06 LAB — CBC WITH DIFFERENTIAL/PLATELET
Basophils Absolute: 0 10*3/uL (ref 0.0–0.1)
Basophils Relative: 0 % (ref 0–1)
Eosinophils Absolute: 0.2 10*3/uL (ref 0.0–0.7)
Eosinophils Relative: 2 % (ref 0–5)
HEMATOCRIT: 23.8 % — AB (ref 36.0–46.0)
HEMOGLOBIN: 7.4 g/dL — AB (ref 12.0–15.0)
LYMPHS ABS: 0.9 10*3/uL (ref 0.7–4.0)
LYMPHS PCT: 9 % — AB (ref 12–46)
MCH: 28.9 pg (ref 26.0–34.0)
MCHC: 31.1 g/dL (ref 30.0–36.0)
MCV: 93 fL (ref 78.0–100.0)
MONO ABS: 0.7 10*3/uL (ref 0.1–1.0)
Monocytes Relative: 6 % (ref 3–12)
Neutro Abs: 8.6 10*3/uL — ABNORMAL HIGH (ref 1.7–7.7)
Neutrophils Relative %: 83 % — ABNORMAL HIGH (ref 43–77)
Platelets: 204 10*3/uL (ref 150–400)
RBC: 2.56 MIL/uL — ABNORMAL LOW (ref 3.87–5.11)
RDW: 17.1 % — ABNORMAL HIGH (ref 11.5–15.5)
WBC: 10.4 10*3/uL (ref 4.0–10.5)

## 2014-11-06 LAB — APTT: aPTT: 36 s (ref 24–37)

## 2014-11-06 MED ORDER — ALBUMIN HUMAN 25 % IV SOLN
12.5000 g | Freq: Once | INTRAVENOUS | Status: AC
  Administered 2014-11-06: 12.5 g via INTRAVENOUS
  Filled 2014-11-06: qty 50

## 2014-11-06 MED ORDER — ALBUMIN HUMAN 25 % IV SOLN
12.5000 g | Freq: Once | INTRAVENOUS | Status: AC
  Administered 2014-11-06: 12.5 g via INTRAVENOUS
  Filled 2014-11-06 (×2): qty 50

## 2014-11-06 MED ORDER — HEPARIN SODIUM (PORCINE) 5000 UNIT/ML IJ SOLN
5000.0000 [IU] | Freq: Three times a day (TID) | INTRAMUSCULAR | Status: DC
  Administered 2014-11-06 – 2014-11-16 (×29): 5000 [IU] via SUBCUTANEOUS
  Filled 2014-11-06 (×37): qty 1

## 2014-11-06 MED ORDER — DEXTROSE 5 % IV SOLN: 0.0000 ug/min | INTRAVENOUS | Status: DC

## 2014-11-06 MED ORDER — NOREPINEPHRINE BITARTRATE 1 MG/ML IV SOLN
0.0000 ug/min | INTRAVENOUS | Status: DC
  Administered 2014-11-06: 8 ug/min via INTRAVENOUS
  Administered 2014-11-06: 10 ug/min via INTRAVENOUS
  Filled 2014-11-06: qty 16

## 2014-11-06 NOTE — Progress Notes (Signed)
[facility] Progress Note   Subjective: CXR still w some edema, wt down 74 at 3am and 72.5 kg now  Filed Vitals:   11/06/14 1300 11/06/14 1330 11/06/14 1400 11/06/14 1404  BP: 99/53 85/44  95/54  Pulse:  196    Temp:      TempSrc:      Resp: 21 21 16 17   Height:      Weight:      SpO2:  100%  100%   Exam: Intubated, w ETT in place Facial edema resolved No jvd Chest clear bilat RRR tachy no MRG Abd soft, NTND No LE edema Neuro sedated, on vent LUA AVG +bruit  HD: TTS [place] 4h 72.5kg 2/2.25 Bath 400/A1.5 LUA AVG Heparin 2400 Aranesp 200 / wk, Hect 5 ug, Venofer 100/ hd thru 1/28   Assessment: 1. Cardiac arrest /severe hyperkalemia - s/p cooling protocol, high K resolved 2. ESRD on HD TTS 3. Pulm edema / vol excess - resolving, still a bit wet on CXR 4. Anemia cont max esa, weekly IV Fe 50 5. VDRF 6. 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     Kelly Splinter MD  pager [phone]    cell [phone]  11/06/2014, 3:00 PM     Recent Labs Lab 11/03/14 0400  11/04/14 1512 11/05/14 0430 11/05/14 1611 11/06/14 0358  NA 133*  < > 135 133* 135 134*  K 4.9  < > 4.2 3.6 3.3* 3.8  CL 101  < > 100 98 98 99  CO2 26  < > 27 27 27 26   GLUCOSE 127*  < > 97 154* 145* 174*  BUN 24*  < > 76* 57* 50* 46*  CREATININE 2.74*  < > 4.92* 3.65* 3.21* 2.74*  CALCIUM 8.0*  < > 8.6 8.7 8.6 8.6  PHOS 4.5  --  4.3  --  4.0  --   < > = values in this interval not displayed.  Recent Labs Lab 11/04/14 1200  11/05/14 0430 11/05/14 1611 11/06/14 0358  AST 20  --  26  --  17  ALT 19  --  23  --  20  ALKPHOS 64  --  82  --  69  BILITOT 0.2*  --  0.6  --  0.6  PROT 5.3*  --  7.5  --  7.9  ALBUMIN 1.9*  < > 2.6* 2.4* 2.6*  < > = values in this interval not displayed.  Recent Labs Lab 11/03/14 0400 11/04/14 0400 11/05/14 0430 11/06/14 0812   WBC 18.9* 19.9* 14.3* 10.4  NEUTROABS 17.8*  --  11.9* 8.6*  HGB 7.3* 7.4* 7.9* 7.4*  HCT 23.0* 23.2* 24.5* 23.8*  MCV 92.4 93.5 91.4 93.0  PLT 127* 159 175 204   . albumin human  12.5 g Intravenous Once  . antiseptic oral rinse  7 mL Mouth Rinse QID  . aspirin  81 mg Oral Daily  . chlorhexidine  15 mL Mouth Rinse BID  . [START ON 11/12/2014] darbepoetin (ARANESP) injection - DIALYSIS  200 mcg Intravenous Q Sat-HD  . [START ON 11/08/2014] doxercalciferol  3 mcg Oral Q T,Th,Sa-HD  . feeding supplement (NEPRO CARB STEADY)  1,000 mL Per Tube Q24H  . feeding supplement (PRO-STAT SUGAR FREE 64)  60 mL Per Tube TID  . [START ON 11/10/2014] ferric gluconate (FERRLECIT/NULECIT)  IV  62.5 mg Intravenous Q Thu-HD  . heparin subcutaneous  5,000 Units Subcutaneous 3 times per day  . insulin aspart  1-3 Units Subcutaneous 6 times per day  . insulin glargine  15 Units Subcutaneous Q24H  . pantoprazole (PROTONIX) IV  40 mg Intravenous Q24H  . piperacillin-tazobactam (ZOSYN)  IV  3.375 g Intravenous 4 times per day   . dextrose    . fentaNYL infusion INTRAVENOUS 25 mcg/hr (11/06/14 1311)  . dialysis replacement fluid (prismasate) 400 mL/hr at 11/06/14 0330  . dialysis replacement fluid (prismasate) 200 mL/hr at 11/05/14 1452  . dialysate (PRISMASATE) 1,500 mL/hr at 11/06/14 1330   sodium chloride, acetaminophen (TYLENOL) oral liquid 160 mg/5 mL, dextrose, fentaNYL, heparin, metoprolol, [START ON 11/07/2014] pneumococcal 23 valent vaccine, sodium chloride

## 2014-11-06 NOTE — Progress Notes (Signed)
PULMONARY / CRITICAL CARE MEDICINE   Name: [name] MRN: [id number] DOB: 04/15/1976    ADMISSION DATE:  10/31/2014  REFERRING MD :  EDP  CHIEF COMPLAINT:  Cardiac arrest   INITIAL PRESENTATION: 39 yo female with extensive PMH including HTN, ESRD on HD, previous TB, DM, kidney-pancreas transplant 2010, on home O2 with witnessed arrest at home.  Had her HD as regularly scheduled on 1/22.  Husband performed CPR.  Wide complex rhythm on EMS arrival.  Total ~1 hour CPR during multiple episodes, 7 epi, 6 defib.  K+ in ER was 8.1.  PCCM called to admit.   STUDIES:  CT head 1/25: ?acute sinusitis pCXR 1/25: cardiomegaly, increasing interstitial and airspace disease EEG 1/25: Anoxic encephalopathy Echo 1/26: severely reduced EF 25-30%, moderate LVH, grade 2 DD, PA pressure 19mmHg, trivial pericardial effusion EEG 1/29: diffuse cerebral dysfunction, non-specific etiology, no seizures  SIGNIFICANT EVENTS: 1/26: admit s/p cardiac arrest, HD, hypothermia protocol, hypotensive during HD 1/26: CVVHD started 1/27: warmed 1/28: Following some commands 1/29: Fevers, zosyn restarted 1/30: afebrile, +BM  SUBJECTIVE: Lying in bed, sleeping but arousable, mild sedation, following all commands, trying to talk to husband. Weaning. Able to give me a thumbs up this morning  VITAL SIGNS: Temp:  [97.1 F (36.2 C)-99.3 F (37.4 C)] 99.3 F (37.4 C) (01/31 0900) Pulse Rate:  [40-117] 106 (01/31 0930) Resp:  [16-29] 22 (01/31 0930) BP: (64-229)/(30-97) 117/57 mmHg (01/31 0930) SpO2:  [92 %-100 %] 100 % (01/31 0930) FiO2 (%):  [40 %] 40 % (01/31 0907) Weight:  [163 lb 12.8 oz (74.3 kg)] 163 lb 12.8 oz (74.3 kg) (01/31 0331) HEMODYNAMICS:   VENTILATOR SETTINGS: Vent Mode:  [-] PSV;CPAP FiO2 (%):  [40 %] 40 % Set Rate:  [20 bmp] 20 bmp Vt Set:  [500 mL] 500 mL PEEP:  [5 cmH20] 5 cmH20 Pressure Support:  [5 cmH20-10 cmH20] 5 cmH20 Plateau Pressure:  [18 cmH20-20 cmH20] 18 cmH20 INTAKE /  OUTPUT:  Intake/Output Summary (Last 24 hours) at 11/06/14 0939 Last data filed at 11/06/14 0900  Gross per 24 hour  Intake 1668.39 ml  Output   5468 ml  Net -3799.61 ml   PHYSICAL EXAMINATION: General:  Lying in bed, on mechanical ventilation Neuro:  Light sedation, RASS 0,  following commands HEENT: ETT in place Cardiovascular: Tachycardia Lungs:  Clear b/l Abdomen:  +bs, soft, non-tender Musculoskeletal:  Improved edema of extremities, faint distal pulses, moving all extremities Skin: Warm  LABS:  CBC  Recent Labs Lab 11/04/14 0400 11/05/14 0430 11/06/14 0812  WBC 19.9* 14.3* 10.4  HGB 7.4* 7.9* 7.4*  HCT 23.2* 24.5* 23.8*  PLT 159 175 204   Coag's  Recent Labs Lab 10/31/14 0954 10/31/14 1055 10/31/14 1826  11/03/14 0400 11/05/14 0430 11/06/14 0358  APTT 51* 148* 42*  < > 56* 37 36  INR 1.16 1.54* 1.25  --   --   --   --   < > = values in this interval not displayed. BMET  Recent Labs Lab 11/05/14 0430 11/05/14 1611 11/06/14 0358  NA 133* 135 134*  K 3.6 3.3* 3.8  CL 98 98 99  CO2 27 27 26   BUN 57* 50* 46*  CREATININE 3.65* 3.21* 2.74*  GLUCOSE 154* 145* 174*   Electrolytes  Recent Labs Lab 11/03/14 0400 11/04/14 0400 11/04/14 1512 11/05/14 0430 11/05/14 1611 11/06/14 0358  CALCIUM 8.0* 8.6 8.6 8.7 8.6 8.6  MG 2.3 2.8*  --  2.8*  --  2.7*  PHOS 4.5  --  4.3  --  4.0  --    ABG  Recent Labs Lab 11/03/14 0419 11/04/14 0505 11/05/14 0455  PHART 7.332* 7.356 7.352  PCO2ART 50.1* 42.4 46.1*  PO2ART 126.0* 71.4* 103.0*   Cardiac Enzymes  Recent Labs Lab 10/31/14 1826 10/31/14 2233 11/01/14 0405  TROPONINI 2.00* 2.44* 2.31*   Glucose  Recent Labs Lab 11/05/14 1143 11/05/14 1615 11/05/14 1935 11/06/14 0005 11/06/14 0358 11/06/14 0721  GLUCAP 180* 126* 191* 194* 159* 167*   Imaging Dg Chest Port 1 View  11/05/2014   CLINICAL DATA:  Follow-up acute respiratory failure. Initial encounter.  EXAM: PORTABLE CHEST - 1  VIEW  COMPARISON:  Chest radiograph performed 11/04/2014  FINDINGS: The patient's endotracheal tube is seen ending 3-4 cm above the carina. An enteric tube is noted extending below the diaphragm. A left IJ line is seen ending about the proximal SVC.  Bibasilar airspace opacification is relatively stable from the prior study. Underlying vascular congestion is noted. This most likely reflects pulmonary edema. Small bilateral pleural effusions are suspected. No pneumothorax is seen.  The cardiomediastinal silhouette is borderline normal in size. No acute osseous abnormalities are identified.  IMPRESSION: Relatively stable bibasilar airspace opacification, with underlying vascular congestion, likely reflecting persistent pulmonary edema. Small bilateral pleural effusions suspected.   Electronically Signed   By: Garald Balding M.D.   On: 11/05/2014 07:51   ASSESSMENT / PLAN:  PULMONARY OETT 1/25>>> VDRF - post cardiac arrest  ?aspiration PNA  Combined acidosis Hx TB  Pulm edema--improving P:   Keep same MV, abg reviewed weaning 5 ps 5--doing well, closer to extubation, needs to be more alert and neg balance F/u CXR am Neg balance goals successful , maintain cvvhd, net neg 5.5L since admission  CARDIOVASCULAR CVL R fem 1/25>>>1/26 R fem aline 1/25>>>1/28 IJ  cvl left 1.26>>> Left fem hd 1/26>>>  Cardiac arrest - presumed r/t hyperkalemia in setting ESRD  Hx HTN  Cardiogenic shock--off pressors Prolonged qtc Cariomyoopathy Cortisol 24.3--d/c steroids  P:  Neg balance important on cvvhd  RENAL ESRD on HD, T,Th,Sat Hyperkalemia--resolved Hyponatremia P:   cvvhd , net neg 5.5 liters bmet in am  kvo  GASTROINTESTINAL At risk ischemic bowel - no evidence  P:   PPI  TF dulc supp prn Responded to lactulose  HEMATOLOGIC Anemia (dilutional, esrd)--Hb down to 7.4 DVT prevention Thrombocytopenia--resolved  P:  Restart DVT prophylaxis Aranesp  INFECTIOUS ?aspiration - likely  source Sinusitis  Immunocompromised in past Sepsis likely (abdo source?), PCT down, pressors down, no bloody stool or diarhea Leukocytosis trending up, febrile 1/29 Enterobacter Fever 101.42F tmax 1/29, now afebrile  P:   Unasyn 1/25>>1/26 Vancomycin 1/26>>>1/28 Zosyn 1/26>>1/28>>1/29>>> Ceftriaxone 1/28>>1/29 Blood cx x2 1/26>>NGTD BC 1/29>>> sputum cultures 1/26: Moderate Enterobacter sensitive to cipro and pip/tazo, resistant to cefazolin Procal: 160.15-->104.98-->68.36 Cdiff 1/30 neg  Follow repeat bc No further temps noted Maintain zosyn, likely will need to use this as the non narrowed treatment as fevers resolve with restart  ENDOCRINE Hyperglycemia R/o relAI--cortisol 24.3 - neg P:   ssi Lantus TF  NEUROLOGIC AMS - post cardiac arrest  Anoxic encephalopathy Followed commands 1/28, 1/30 P:   Come down on fentanyl to off as goal wua daily  FAMILY  - Updates:  Updated husband by bedside daily, 1/31 included  - Inter-disciplinary family meet or Palliative Care meeting due by:  2/1  Signed: Jerene Pitch, MD PGY-3, Internal Medicine Resident Pager: [phone]  11/06/2014,9:50 AM   STAFF  NOTE: I, Merrie Roof, MD FACP have personally reviewed patient's available data, including medical history, events of note, physical examination and test results as part of my evaluation. I have discussed with resident/NP and other care providers such as pharmacist, RN and RRT. In addition, I personally evaluated patient and elicited key findings of: Her mental status is super, airway protection is good, weaning today cpap 5 ps 45, appears good, RSBI good, with new cardiomyoapthy and continued pcxr c/w edema, wold favor further neg balance then extubation likely in am, no further fevers when zosyn restarted, should complete course zosyn, continued cvhhd, to chair position The patient is critically ill with multiple organ systems failure and requires high complexity decision  making for assessment and support, frequent evaluation and titration of therapies, application of advanced monitoring technologies and extensive interpretation of multiple databases.   Critical Care Time devoted to patient care services described in this note is30 Minutes. This time reflects time of care of this signee: Merrie Roof, MD FACP. This critical care time does not reflect procedure time, or teaching time or supervisory time of PA/NP/Med student/Med Resident etc but could involve care discussion time. Rest per NP/medical resident whose note is outlined above and that I agree with   Lavon Paganini. Titus Mould, MD, [place] Pgr: [facility] 11/06/2014 3:34 PM

## 2014-11-07 ENCOUNTER — Encounter (HOSPITAL_COMMUNITY): Payer: Self-pay | Admitting: *Deleted

## 2014-11-07 ENCOUNTER — Inpatient Hospital Stay (HOSPITAL_COMMUNITY): Payer: Medicare Other

## 2014-11-07 LAB — BLOOD GAS, ARTERIAL
Acid-base deficit: 0.5 mmol/L (ref 0.0–2.0)
Bicarbonate: 23.8 mEq/L (ref 20.0–24.0)
DRAWN BY: 24513
FIO2: 0.4 %
O2 SAT: 97.7 %
PCO2 ART: 39.6 mmHg (ref 35.0–45.0)
PEEP: 5 cmH2O
Patient temperature: 98.7
RATE: 20 resp/min
TCO2: 25 mmol/L (ref 0–100)
VT: 500 mL
pH, Arterial: 7.395 (ref 7.350–7.450)
pO2, Arterial: 153 mmHg — ABNORMAL HIGH (ref 80.0–100.0)

## 2014-11-07 LAB — CBC
HCT: 26.3 % — ABNORMAL LOW (ref 36.0–46.0)
HEMOGLOBIN: 8.3 g/dL — AB (ref 12.0–15.0)
MCH: 29.3 pg (ref 26.0–34.0)
MCHC: 31.6 g/dL (ref 30.0–36.0)
MCV: 92.9 fL (ref 78.0–100.0)
Platelets: 348 10*3/uL (ref 150–400)
RBC: 2.83 MIL/uL — ABNORMAL LOW (ref 3.87–5.11)
RDW: 17.6 % — ABNORMAL HIGH (ref 11.5–15.5)
WBC: 10.2 10*3/uL (ref 4.0–10.5)

## 2014-11-07 LAB — BASIC METABOLIC PANEL
Anion gap: 8 (ref 5–15)
BUN: 41 mg/dL — AB (ref 6–23)
CO2: 26 mmol/L (ref 19–32)
CREATININE: 2.48 mg/dL — AB (ref 0.50–1.10)
Calcium: 9.2 mg/dL (ref 8.4–10.5)
Chloride: 100 mmol/L (ref 96–112)
GFR calc Af Amer: 27 mL/min — ABNORMAL LOW (ref >90)
GFR calc non Af Amer: 24 mL/min — ABNORMAL LOW (ref >90)
Glucose, Bld: 217 mg/dL — ABNORMAL HIGH (ref 70–99)
Potassium: 3.7 mmol/L (ref 3.5–5.1)
Sodium: 134 mmol/L — ABNORMAL LOW (ref 135–145)

## 2014-11-07 LAB — GLUCOSE, CAPILLARY
GLUCOSE-CAPILLARY: 168 mg/dL — AB (ref 70–99)
GLUCOSE-CAPILLARY: 197 mg/dL — AB (ref 70–99)
GLUCOSE-CAPILLARY: 242 mg/dL — AB (ref 70–99)
GLUCOSE-CAPILLARY: 42 mg/dL — AB (ref 70–99)
GLUCOSE-CAPILLARY: 444 mg/dL — AB (ref 70–99)
Glucose-Capillary: 204 mg/dL — ABNORMAL HIGH (ref 70–99)
Glucose-Capillary: 203 mg/dL — ABNORMAL HIGH (ref 70–99)

## 2014-11-07 LAB — CULTURE, BLOOD (ROUTINE X 2)
Culture: NO GROWTH
Culture: NO GROWTH

## 2014-11-07 LAB — MAGNESIUM: MAGNESIUM: 3 mg/dL — AB (ref 1.5–2.5)

## 2014-11-07 LAB — APTT: aPTT: 36 seconds (ref 24–37)

## 2014-11-07 MED ORDER — KETOROLAC TROMETHAMINE 15 MG/ML IJ SOLN
15.0000 mg | Freq: Once | INTRAMUSCULAR | Status: AC
  Administered 2014-11-07: 15 mg via INTRAVENOUS
  Filled 2014-11-07: qty 1

## 2014-11-07 MED ORDER — KETOROLAC TROMETHAMINE 15 MG/ML IJ SOLN
7.5000 mg | Freq: Four times a day (QID) | INTRAMUSCULAR | Status: DC | PRN
  Administered 2014-11-07 – 2014-11-10 (×9): 7.5 mg via INTRAVENOUS
  Filled 2014-11-07 (×12): qty 1

## 2014-11-07 MED ORDER — PIPERACILLIN-TAZOBACTAM IN DEX 2-0.25 GM/50ML IV SOLN
2.2500 g | Freq: Three times a day (TID) | INTRAVENOUS | Status: DC
  Administered 2014-11-07 – 2014-11-09 (×5): 2.25 g via INTRAVENOUS
  Filled 2014-11-07 (×8): qty 50

## 2014-11-07 MED ORDER — SODIUM CHLORIDE 0.9 % IV SOLN
INTRAVENOUS | Status: DC
  Administered 2014-11-07: 2.8 [IU]/h via INTRAVENOUS
  Filled 2014-11-07: qty 2.5

## 2014-11-07 MED ORDER — FENTANYL CITRATE 0.05 MG/ML IJ SOLN
12.5000 ug | INTRAMUSCULAR | Status: DC | PRN
  Administered 2014-11-07: 25 ug via INTRAVENOUS
  Administered 2014-11-07: 12.5 ug via INTRAVENOUS
  Administered 2014-11-08 – 2014-11-10 (×16): 25 ug via INTRAVENOUS
  Administered 2014-11-11: 12.5 ug via INTRAVENOUS
  Administered 2014-11-11 (×3): 25 ug via INTRAVENOUS
  Administered 2014-11-11: 12.5 ug via INTRAVENOUS
  Administered 2014-11-11 – 2014-11-12 (×6): 25 ug via INTRAVENOUS
  Filled 2014-11-07 (×26): qty 2

## 2014-11-07 NOTE — Progress Notes (Signed)
ANTIBIOTIC CONSULT NOTE   Pharmacy Consult for zosyn Indication: PNA  Allergies  Allergen Reactions  . Ace Inhibitors Cough    Patient Measurements: Height: 5\' 7"  (170.2 cm) Weight: 156 lb 12 oz (71.1 kg) IBW/kg (Calculated) : 61.6  Vital Signs: Temp: 97.9 F (36.6 C) (02/01 0700) Temp Source: Axillary (02/01 0700) BP: 103/55 mmHg (02/01 0900) Pulse Rate: 98 (02/01 0900) Intake/Output from previous day: 01/31 0701 - 02/01 0700 In: 1559.5 [I.V.:194.7; NG/GT:1130; IV Piggyback:234.8] Out: 3403  Intake/Output from this shift: Total I/O In: 129.1 [I.V.:14.1; NG/GT:90; IV Piggyback:25] Out: 237 [Other:237]  Labs:  Recent Labs  11/05/14 0430  11/06/14 0358 11/06/14 0812 11/06/14 1600 11/07/14 0418  WBC 14.3*  --   --  10.4  --  10.2  HGB 7.9*  --   --  7.4*  --  8.3*  PLT 175  --   --  204  --  348  CREATININE 3.65*  < > 2.74*  --  2.50* 2.48*  < > = values in this interval not displayed. Estimated Creatinine Clearance: 29.9 mL/min (by C-G formula based on Cr of 2.48). No results for input(s): VANCOTROUGH, VANCOPEAK, VANCORANDOM, GENTTROUGH, GENTPEAK, GENTRANDOM, TOBRATROUGH, TOBRAPEAK, TOBRARND, AMIKACINPEAK, AMIKACINTROU, AMIKACIN in the last 72 hours.   Microbiology: Recent Results (from the past 720 hour(s))  MRSA PCR Screening     Status: None   Collection Time: 10/31/14 11:33 AM  Result Value Ref Range Status   MRSA by PCR NEGATIVE NEGATIVE Final    Comment:        The GeneXpert MRSA Assay (FDA approved for NASAL specimens only), is one component of a comprehensive MRSA colonization surveillance program. It is not intended to diagnose MRSA infection nor to guide or monitor treatment for MRSA infections.   Culture, blood (routine x 2)     Status: None   Collection Time: 11/01/14 11:00 AM  Result Value Ref Range Status   Specimen Description BLOOD RIGHT ARM  Final   Special Requests BOTTLES DRAWN AEROBIC ONLY 5CC  Final   Culture   Final    NO  GROWTH 5 DAYS Performed at [facility]    Report Status 11/07/2014 FINAL  Final  Culture, blood (routine x 2)     Status: None   Collection Time: 11/01/14 11:10 AM  Result Value Ref Range Status   Specimen Description BLOOD RIGHT HAND  Final   Special Requests BOTTLES DRAWN AEROBIC ONLY 3CC  Final   Culture   Final    NO GROWTH 5 DAYS Performed at [facility]    Report Status 11/07/2014 FINAL  Final  Culture, respiratory (NON-Expectorated)     Status: None   Collection Time: 11/01/14  3:10 PM  Result Value Ref Range Status   Specimen Description TRACHEAL ASPIRATE  Final   Special Requests NONE  Final   Gram Stain   Final    ABUNDANT WBC PRESENT, PREDOMINANTLY PMN RARE SQUAMOUS EPITHELIAL CELLS PRESENT RARE GRAM POSITIVE COCCI IN PAIRS RARE GRAM NEGATIVE RODS Performed at [facility]    Culture   Final    MODERATE ENTEROBACTER CLOACAE Performed at [facility]    Report Status 11/03/2014 FINAL  Final   Organism ID, Bacteria ENTEROBACTER CLOACAE  Final      Susceptibility   Enterobacter cloacae - MIC*    CEFAZOLIN >=64 RESISTANT Resistant     CEFEPIME <=1 SENSITIVE Sensitive     CEFTAZIDIME <=1 SENSITIVE Sensitive     CEFTRIAXONE <=  1 SENSITIVE Sensitive     CIPROFLOXACIN <=0.25 SENSITIVE Sensitive     GENTAMICIN <=1 SENSITIVE Sensitive     IMIPENEM 0.5 SENSITIVE Sensitive     PIP/TAZO <=4 SENSITIVE Sensitive     TOBRAMYCIN <=1 SENSITIVE Sensitive     TRIMETH/SULFA <=20 SENSITIVE Sensitive     * MODERATE ENTEROBACTER CLOACAE  Culture, blood (routine x 2)     Status: None (Preliminary result)   Collection Time: 11/04/14  3:12 PM  Result Value Ref Range Status   Specimen Description BLOOD RIGHT HAND  Final   Special Requests BOTTLES DRAWN AEROBIC AND ANAEROBIC 4CC  Final   Culture   Final           BLOOD CULTURE RECEIVED NO GROWTH TO DATE CULTURE WILL BE HELD FOR 5 DAYS BEFORE ISSUING A FINAL NEGATIVE REPORT Performed at [facility]    Report Status PENDING  Incomplete  Culture, blood (routine x 2)     Status: None (Preliminary result)   Collection Time: 11/04/14  6:35 PM  Result Value Ref Range Status   Specimen Description BLOOD RIGHT ARM  Final   Special Requests BOTTLES DRAWN AEROBIC AND ANAEROBIC 10CC  Final   Culture   Final           BLOOD CULTURE RECEIVED NO GROWTH TO DATE CULTURE WILL BE HELD FOR 5 DAYS BEFORE ISSUING A FINAL NEGATIVE REPORT Performed at [facility]    Report Status PENDING  Incomplete  Clostridium Difficile by PCR     Status: None   Collection Time: 11/05/14  3:31 PM  Result Value Ref Range Status   C difficile by pcr NEGATIVE NEGATIVE Final    Medical History: Past Medical History  Diagnosis Date  . HTN (hypertension)     not well controlled  . 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 History of blood transfusion     "lots of times" (01/27/2013)  . CAP (community acquired pneumonia) 2010  . Type II diabetes mellitus   . Anemia   . Ovarian tumor, malignant 2005    resected in [place]' pt denies that this was cancer on 01/27/2013  . Diabetic retinopathy   . On home oxygen therapy     "5L; 24/7" (01/25/2014)  . ESRD on hemodialysis 08/25/2013    Started HD in 2008.  Had [place] transplant in 2010 at [facility].  Went back on HD in 2012.  Pancreas was removed surgically at [facility] for rejection w abcess formation.  The kidney was left in.  She now gets HD at [facility] on TTS schedule.     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  Both organs have since rejected, see "ESRD" for details.     Medications:  Scheduled:  . aspirin  81 mg Oral Daily  . [START ON 11/12/2014] darbepoetin (ARANESP) injection - DIALYSIS  200 mcg Intravenous Q Sat-HD  . [START ON 11/08/2014] doxercalciferol  3 mcg Oral Q T,Th,Sa-HD  . feeding supplement (NEPRO CARB STEADY)   1,000 mL Per Tube Q24H  . feeding supplement (PRO-STAT SUGAR FREE 64)  60 mL Per Tube TID  . [START ON 11/10/2014] ferric gluconate (FERRLECIT/NULECIT) IV  62.5 mg Intravenous Q Thu-HD  . heparin subcutaneous  5,000 Units Subcutaneous 3 times per day  . insulin aspart  1-3 Units Subcutaneous 6  times per day  . insulin glargine  15 Units Subcutaneous Q24H  . ketorolac  15 mg Intravenous Once  . piperacillin-tazobactam (ZOSYN)  IV  2.25 g Intravenous 3 times per day    Assessment: 39 yo female s/p arrest completed hypothermia protocol. Currently on zosyn for enterobacter pneumonia. Afebrile this am, wbc trending down to 10.   CRRT stopped this am after filter clotted.  1/25 unasyn>> 1/26 1/26 vanc>1/28 1/26 zosyn>>1/28, 1/29 1/28 ceftriaxone>> 1/29   1/26 blood x2-ng 1/26 TA - ENTEROBACTER CLOACAE -sensitive to zosyn  Goal of Therapy:  Vancomycin trough level 15-20 mcg/ml  Plan:  -Zosyn 2.25g IV q8h -Planning 7d course (?2/2)  Erin Hearing PharmD., BCPS Clinical Pharmacist Pager [phone] 11/07/2014 10:43 AM

## 2014-11-07 NOTE — Procedures (Signed)
Extubation Procedure Note  Patient Details:   Name: [name] DOB: 1975-11-05 MRN: [id number]   Airway Documentation:     Evaluation  O2 sats: stable throughout Complications: No apparent complications Patient did tolerate procedure well. Bilateral Breath Sounds: Rhonchi Suctioning: Airway, Oral Yes  4l [place], incentive spirometer instructed, pt achieved 358ml  Revonda Standard 11/07/2014, 9:12 AM

## 2014-11-07 NOTE — Progress Notes (Signed)
Assessment: 1. Cardiac arrest /severe hyperkalemia - s/p cooling protocol, high K resolved 2. ESRD on HD TTS 3. Pulm edema / vol excess - improving now 4. Anemia cont max esa, weekly IV Fe 50 5. VDRF 6. 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  Plan - Will stop CRRT today, Toradol for chest pain, IHD Tues or Wed  Subjective: Interval History: extubated today, c/o chest pain  Objective: Vital signs in last 24 hours: Temp:  [97.4 F (36.3 C)-98.5 F (36.9 C)] 97.9 F (36.6 C) (02/01 0700) Pulse Rate:  [40-196] 98 (02/01 0900) Resp:  [14-32] 23 (02/01 0900) BP: (64-164)/(33-74) 103/55 mmHg (02/01 0900) SpO2:  [84 %-100 %] 100 % (02/01 0900) FiO2 (%):  [40 %] 40 % (02/01 0805) Weight:  [71.1 kg (156 lb 12 oz)] 71.1 kg (156 lb 12 oz) (02/01 0400) Weight change: -3.2 kg (-7 lb 0.9 oz)  Intake/Output from previous day: 01/31 0701 - 02/01 0700 In: 1559.5 [I.V.:194.7; NG/GT:1130; IV Piggyback:234.8] Out: 3403  Intake/Output this shift: Total I/O In: 129.1 [I.V.:14.1; NG/GT:90; IV Piggyback:25] Out: 237 [Other:237]  General appearance: alert and cooperative Resp: clear to auscultation bilaterally Chest wall: tender sternum GI: soft, non-tender; bowel sounds normal; no masses,  no organomegaly Extremities: extremities normal, atraumatic, no cyanosis or edema  Lab Results:  Recent Labs  11/06/14 0812 11/07/14 0418  WBC 10.4 10.2  HGB 7.4* 8.3*  HCT 23.8* 26.3*  PLT 204 348   BMET:  Recent Labs  11/06/14 1600 11/07/14 0418  NA 136 134*  K 3.8 3.7  CL 96 100  CO2 22 26  GLUCOSE 210* 217*  BUN 36* 41*  CREATININE 2.50* 2.48*  CALCIUM 8.5 9.2   No results for input(s): PTH in the last 72 hours. Iron Studies: No results for input(s): IRON, TIBC, TRANSFERRIN, FERRITIN in the last 72 hours. Studies/Results: Dg Chest Port 1 View  11/07/2014   CLINICAL DATA:  Pneumonia.  EXAM: PORTABLE CHEST - 1 VIEW  COMPARISON:  11/06/2014.  11/05/2014.  FINDINGS:  Endotracheal tube and NG tube in stable position. Left IJ line in stable position. Continued improvement of bilateral pulmonary infiltrates. No pleural effusion or pneumothorax. Slight nodularity noted in both lungs most likely represents residual mild edema. Continued follow-up suggested. No pneumothorax. No acute osseous abnormality.  IMPRESSION: 1. Lines and tubes in stable position. 2. Continued resolving congestive heart failure and pulmonary edema. Slight nodularity in noted in both lungs most likely represents mild residual edema. Continued follow-up is suggested.   Electronically Signed   By: Marcello Moores  Register   On: 11/07/2014 07:34   Dg Chest Port 1 View  11/06/2014   CLINICAL DATA:  39 year old female pulmonary edema.  EXAM: PORTABLE CHEST - 1 VIEW  COMPARISON:  Chest x-ray 11/05/2014.  FINDINGS: An endotracheal tube is in place with tip 4.5 cm above the carina. A nasogastric tube is seen extending into the stomach, however, the tip of the nasogastric tube extends below the lower margin of the image. There is a left-sided internal jugular central venous catheter with tip terminating in the left innominate vein near the confluence with the superior vena cava. There is cephalization of the pulmonary vasculature and slight indistinctness of the interstitial markings suggestive of mild pulmonary edema. Small left pleural effusion decreased in size. Previously noted right pleural effusion has resolved. Mild cardiomegaly. The patient is rotated to the left on today's exam, resulting in distortion of the mediastinal contours and reduced diagnostic sensitivity  and specificity for mediastinal pathology.  IMPRESSION: 1. The appearance the chest again suggests improving congestive heart failure, as above. 2. Support apparatus, as above.   Electronically Signed   By: Vinnie Langton M.D.   On: 11/06/2014 08:19   Scheduled: . aspirin  81 mg Oral Daily  . [START ON 11/12/2014] darbepoetin (ARANESP) injection -  DIALYSIS  200 mcg Intravenous Q Sat-HD  . [START ON 11/08/2014] doxercalciferol  3 mcg Oral Q T,Th,Sa-HD  . feeding supplement (NEPRO CARB STEADY)  1,000 mL Per Tube Q24H  . feeding supplement (PRO-STAT SUGAR FREE 64)  60 mL Per Tube TID  . [START ON 11/10/2014] ferric gluconate (FERRLECIT/NULECIT) IV  62.5 mg Intravenous Q Thu-HD  . heparin subcutaneous  5,000 Units Subcutaneous 3 times per day  . insulin aspart  1-3 Units Subcutaneous 6 times per day  . insulin glargine  15 Units Subcutaneous Q24H  . piperacillin-tazobactam (ZOSYN)  IV  3.375 g Intravenous 4 times per day     LOS: 7 days   Calee Nugent C 11/07/2014,9:48 AM

## 2014-11-07 NOTE — Progress Notes (Signed)
Filter clotted. Unable to return blood. CRRT dc'd.

## 2014-11-07 NOTE — Progress Notes (Signed)
CRRT unit was continuously in self test mode making it impossible to get to home screen to access blood return function. Blood was not returned because it was clotted. The  CRRT unit was changed with a new filter set and therapy reprogrammed.

## 2014-11-07 NOTE — Progress Notes (Signed)
PULMONARY / CRITICAL CARE MEDICINE   Name: [name] MRN: [id number] DOB: 17-Mar-1976    ADMISSION DATE:  10/31/2014  REFERRING MD :  EDP  CHIEF COMPLAINT:  Cardiac arrest   INITIAL PRESENTATION: 39 yo female with extensive PMH including HTN, ESRD on HD, previous TB, DM, kidney-pancreas transplant 2010, on home O2 with witnessed arrest at home.  Had her HD as regularly scheduled on 1/22.  Husband performed CPR.  Wide complex rhythm on EMS arrival.  Total ~1 hour CPR during multiple episodes, 7 epi, 6 defib.  K+ in ER was 8.1.  PCCM called to admit.   STUDIES:  CT head 1/25: ?acute sinusitis pCXR 1/25: cardiomegaly, increasing interstitial and airspace disease EEG 1/25: Anoxic encephalopathy Echo 1/26: severely reduced EF 25-30%, moderate LVH, grade 2 DD, PA pressure 29mmHg, trivial pericardial effusion EEG 1/29: diffuse cerebral dysfunction, non-specific etiology, no seizures  SIGNIFICANT EVENTS: 1/26: admit s/p cardiac arrest, HD, hypothermia protocol, hypotensive during HD 1/26: CVVHD started 1/27: warmed 1/28: Following some commands 1/29: Fevers, zosyn restarted 1/30: afebrile, +BM 1/31: Pressure support ventilation for > 8 hours, 2L volume removed  SUBJECTIVE: Awake, alert, on SBT, > 1 hour, wants tube out  VITAL SIGNS: Temp:  [97.4 F (36.3 C)-99.3 F (37.4 C)] 97.9 F (36.6 C) (02/01 0700) Pulse Rate:  [40-196] 89 (02/01 0800) Resp:  [14-32] 27 (02/01 0800) BP: (64-169)/(33-74) 127/71 mmHg (02/01 0800) SpO2:  [84 %-100 %] 100 % (02/01 0800) FiO2 (%):  [40 %] 40 % (02/01 0805) Weight:  [71.1 kg (156 lb 12 oz)] 71.1 kg (156 lb 12 oz) (02/01 0400) HEMODYNAMICS:   VENTILATOR SETTINGS: Vent Mode:  [-] PSV;CPAP FiO2 (%):  [40 %] 40 % Set Rate:  [20 bmp] 20 bmp Vt Set:  [500 mL] 500 mL PEEP:  [5 cmH20] 5 cmH20 Pressure Support:  [5 cmH20-10 cmH20] 5 cmH20 Plateau Pressure:  [19 cmH20-20 cmH20] 20 cmH20 INTAKE / OUTPUT:  Intake/Output Summary (Last 24 hours) at  11/07/14 0848 Last data filed at 11/07/14 0800  Gross per 24 hour  Intake 1563.62 ml  Output   3550 ml  Net -1986.38 ml   PHYSICAL EXAMINATION: General:  Awake on vent HEENT: NCAT ETT in place PULM: Few crackles in bases, mostly clear CV: RRR, no mgr AB: BS+, soft, nontender Ext: warm, trace edema Neuro: Awake and alert, following commands, nodding head to questions  LABS:  CBC  Recent Labs Lab 11/05/14 0430 11/06/14 0812 11/07/14 0418  WBC 14.3* 10.4 10.2  HGB 7.9* 7.4* 8.3*  HCT 24.5* 23.8* 26.3*  PLT 175 204 348   Coag's  Recent Labs Lab 10/31/14 0954 10/31/14 1055 10/31/14 1826  11/05/14 0430 11/06/14 0358 11/07/14 0418  APTT 51* 148* 42*  < > 37 36 36  INR 1.16 1.54* 1.25  --   --   --   --   < > = values in this interval not displayed. BMET  Recent Labs Lab 11/06/14 0358 11/06/14 1600 11/07/14 0418  NA 134* 136 134*  K 3.8 3.8 3.7  CL 99 96 100  CO2 26 22 26   BUN 46* 36* 41*  CREATININE 2.74* 2.50* 2.48*  GLUCOSE 174* 210* 217*   Electrolytes  Recent Labs Lab 11/04/14 1512 11/05/14 0430 11/05/14 1611 11/06/14 0358 11/06/14 1600 11/07/14 0418  CALCIUM 8.6 8.7 8.6 8.6 8.5 9.2  MG  --  2.8*  --  2.7*  --  3.0*  PHOS 4.3  --  4.0  --  3.2  --    ABG  Recent Labs Lab 11/05/14 0455 11/06/14 1039 11/07/14 0332  PHART 7.352 7.360 7.395  PCO2ART 46.1* 44.5 39.6  PO2ART 103.0* 118.0* 153.0*   Cardiac Enzymes  Recent Labs Lab 10/31/14 1826 10/31/14 2233 11/01/14 0405  TROPONINI 2.00* 2.44* 2.31*   Glucose  Recent Labs Lab 11/06/14 1107 11/06/14 1607 11/06/14 2009 11/07/14 0018 11/07/14 0409 11/07/14 0721  GLUCAP 167* 178* 179* 197* 204* 168*   Imaging Dg Chest Port 1 View  11/06/2014   CLINICAL DATA:  39 year old female pulmonary edema.  EXAM: PORTABLE CHEST - 1 VIEW  COMPARISON:  Chest x-ray 11/05/2014.  FINDINGS: An endotracheal tube is in place with tip 4.5 cm above the carina. A nasogastric tube is seen extending  into the stomach, however, the tip of the nasogastric tube extends below the lower margin of the image. There is a left-sided internal jugular central venous catheter with tip terminating in the left innominate vein near the confluence with the superior vena cava. There is cephalization of the pulmonary vasculature and slight indistinctness of the interstitial markings suggestive of mild pulmonary edema. Small left pleural effusion decreased in size. Previously noted right pleural effusion has resolved. Mild cardiomegaly. The patient is rotated to the left on today's exam, resulting in distortion of the mediastinal contours and reduced diagnostic sensitivity and specificity for mediastinal pathology.  IMPRESSION: 1. The appearance the chest again suggests improving congestive heart failure, as above. 2. Support apparatus, as above.   Electronically Signed   By: Vinnie Langton M.D.   On: 11/06/2014 08:19   ASSESSMENT / PLAN:  PULMONARY OETT 1/25>>> 2/1 Acute respiratory failure with hypoxemia  ?aspiration PNA  Hx TB  Pulm edema> improving P:   Extubate today NPO four hours post extubation then RN bedside swallow eval O2 as needed for O2 saturation > 92%  CARDIOVASCULAR CVL R fem 1/25>>>1/26 R fem aline 1/25>>>1/28 IJ  cvl left 1/26>>> Left fem hd 1/26>>>  Cardiac arrest - presumed r/t hyperkalemia in setting ESRD  Hx HTN  Cardiogenic shock> resolved Prolonged qtc > resolved 1/26 EKG Cariomyopathy   P:  Continue negative balance Repeat Echo per cardiology recs   RENAL ESRD on HD, T,Th,Sat Hyperkalemia > resolved Hyponatremia P:   CVVHD per renal Monitor BMET and UOP Replace electrolytes as needed  GASTROINTESTINAL Protein calorie malnutrition  P:   D/C PPI TF > hold post extubation Advance diet if swallowing OK  HEMATOLOGIC Anemia (dilutional, esrd) DVT prevention Thrombocytopenia > resolved  P:  DVT prophylaxis Aranesp  INFECTIOUS Pneumonia> presumably  aspiration, improving  P:   Unasyn 1/25>>1/26 Vancomycin 1/26>>>1/28 Zosyn 1/26>>1/28>>1/29>>> Ceftriaxone 1/28>>1/29 Blood cx x2 1/26>>NGTD BC 1/29>>> sputum cultures 1/26: Moderate Enterobacter sensitive to cipro and pip/tazo, resistant to cefazolin Procal: 160.15-->104.98-->68.36 Cdiff 1/30 neg   Maintain zosyn 7 days total  ENDOCRINE Hyperglycemia P:   SSI Lantus TF  NEUROLOGIC Anoxic encephalopathy post cardiac arrest> improving P:   Fentanyl prn  FAMILY  - Updates:  Updated husband by bedside daily, 2/1 included  - Inter-disciplinary family meet or Palliative Care meeting due by:  2/1  My cc time 35 minutes  Signed: Roselie Awkward, MD [place] Pager: [phone] Cell: [phone] If no response, call [phone]  11/07/2014 8:48 AM

## 2014-11-07 NOTE — Evaluation (Signed)
Clinical/Bedside Swallow Evaluation Patient Details  Name: [name] MRN: [id number] Date of Birth: 03-31-1976  Today's Date: 11/07/2014 Time: SLP Start Time (ACUTE ONLY): 1344 SLP Stop Time (ACUTE ONLY): 1354 SLP Time Calculation (min) (ACUTE ONLY): 10 min  Past Medical History:  Past Medical History  Diagnosis Date  . HTN (hypertension)     not well controlled  . 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 History of blood transfusion     "lots of times" (01/27/2013)  . CAP (community acquired pneumonia) 2010  . Type II diabetes mellitus   . Anemia   . Ovarian tumor, malignant 2005    resected in [place]' pt denies that this was cancer on 01/27/2013  . Diabetic retinopathy   . On home oxygen therapy     "5L; 24/7" (01/25/2014)  . ESRD on hemodialysis 08/25/2013    Started HD in 2008.  Had [place] transplant in 2010 at [name].  Went back on HD in 2012.  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  The kidney was left in.  She now gets HD at [name] on TTS schedule.     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  Both organs have since rejected, see "ESRD" for details.    Past Surgical History:  Past Surgical History  Procedure Laterality Date  . Tumor removal  2005    Ovarian, resected in [place]  . Av fistula placement  06/03/2012    Procedure: INSERTION OF ARTERIOVENOUS (AV) GORE-TEX GRAFT ARM;  Surgeon: Alicia Posner, MD;  Location: [name];  Service: Vascular;  Laterality: Left;  [name] Kitchen Eye surgery Right     "cause of diabetes" (4/23/20140  . Combined kidney-pancreas transplant  2010    /notes 01/27/2013; @ [name]  . 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 Renal biopsy  12/13/2010; 10/25/2011    [name] 01/27/2013  . Tee without cardioversion N/A 04/22/2014    Procedure:  TRANSESOPHAGEAL ECHOCARDIOGRAM (TEE);  Surgeon: Alicia Records, MD;  Location: [name];  Service: Cardiovascular;  Laterality: N/AHinton [name]   HPI:  Pt is 39 y/o female with extensive PMH including HTN, ESRD, previous TB, DM, kidney-pancreas transplant 2010, on home O2 with witnessed arrest at home.  Had her HD as regularly scheduled on 1/22.  Husband performed CPR.  Total ~1 hour CPR during multiple episodes. CT (11/07/2014) revealed resolving congestive heart failure and pulmonary edema. Slight nodularity was noted in both lungs most likely represents mild residual edema. Pt was intubated for a total of 7 days. Extubated on 11/07/2014.   Assessment / Plan / Recommendation Clinical Impression  Pt presents with evidence of an acute reversible dysphagia following intubation including, hoarse and breathy vocal quality and  immediate cough and gurgling following PO trials with thin liquids. Pt is at high risk for aspiration with thin liquids. PO trials with puree did not elicit s/s of aspiration. Recommend dys 2/ pudding thick liquids. Speech will follow-up to determine if diet upgrade possible tomorrow. Left sign in the room with diet recommendations. Discussed diet recommendations with nurse and pt's family/caregivers.    Aspiration Risk  Moderate    Diet Recommendation Dysphagia 2 (Fine chop);Pudding Thick  Medication Administration: Whole meds with puree Supervision: Full supervision/cueing for compensatory  strategies Compensations: Slow rate;Small sips/bites Postural Changes and/or Swallow Maneuvers: Upright 30-60 min after meal    Other  Recommendations Oral Care Recommendations: Oral care BID   Follow Up Recommendations       Frequency and Duration min 2x/week  2 weeks   Pertinent Vitals/Pain     SLP Swallow Goals     Swallow Study Prior Functional Status   Lives With: Spouse    General HPI: Pt is 39 y/o female with extensive PMH including HTN, ESRD, previous TB, DM,  kidney-pancreas transplant 2010, on home O2 with witnessed arrest at home. Had her HD as regularly scheduled on 1/22. Husband performed CPR. Total ~1 hour CPR during multiple episodes. CT (11/07/2014) revealed resolving congestive heart failure and pulmonary edema. Type of Study: Bedside swallow evaluation Temperature Spikes Noted: No Respiratory Status: Room air History of Recent Intubation: Yes Length of Intubations (days): 7 days Date extubated: 11/07/14 Behavior/Cognition: Cooperative;Alert;Pleasant mood;Impulsive Oral Cavity - Dentition: Adequate natural dentition Self-Feeding Abilities: Able to feed self Patient Positioning: Upright in bed Baseline Vocal Quality: Breathy;Hoarse    Oral/Motor/Sensory Function Overall Oral Motor/Sensory Function: Appears within functional limits for tasks assessed Labial ROM: Within Functional Limits Labial Symmetry: Within Functional Limits Lingual ROM: Within Functional Limits Lingual Symmetry: Within Functional Limits   Ice Chips Ice chips: Not tested   Thin Liquid Thin Liquid: Impaired Presentation: Cup Pharyngeal  Phase Impairments: Cough - Immediate    Nectar Thick Nectar Thick Liquid: Not tested   Honey Thick Honey Thick Liquid: Not tested   Puree Puree: Within functional limits   Solid   [name]    Solid: Within functional limits       [name] 11/07/2014,2:25 PM

## 2014-11-08 DIAGNOSIS — I429 Cardiomyopathy, unspecified: Secondary | ICD-10-CM

## 2014-11-08 DIAGNOSIS — I10 Essential (primary) hypertension: Secondary | ICD-10-CM

## 2014-11-08 LAB — CBC
HEMATOCRIT: 27.8 % — AB (ref 36.0–46.0)
Hemoglobin: 8.8 g/dL — ABNORMAL LOW (ref 12.0–15.0)
MCH: 29.9 pg (ref 26.0–34.0)
MCHC: 31.7 g/dL (ref 30.0–36.0)
MCV: 94.6 fL (ref 78.0–100.0)
PLATELETS: 463 10*3/uL — AB (ref 150–400)
RBC: 2.94 MIL/uL — ABNORMAL LOW (ref 3.87–5.11)
RDW: 18.2 % — ABNORMAL HIGH (ref 11.5–15.5)
WBC: 11.5 10*3/uL — ABNORMAL HIGH (ref 4.0–10.5)

## 2014-11-08 LAB — GLUCOSE, CAPILLARY
GLUCOSE-CAPILLARY: 122 mg/dL — AB (ref 70–99)
GLUCOSE-CAPILLARY: 155 mg/dL — AB (ref 70–99)
GLUCOSE-CAPILLARY: 166 mg/dL — AB (ref 70–99)
GLUCOSE-CAPILLARY: 211 mg/dL — AB (ref 70–99)
GLUCOSE-CAPILLARY: 217 mg/dL — AB (ref 70–99)
GLUCOSE-CAPILLARY: 223 mg/dL — AB (ref 70–99)
GLUCOSE-CAPILLARY: 257 mg/dL — AB (ref 70–99)
Glucose-Capillary: 344 mg/dL — ABNORMAL HIGH (ref 70–99)
Glucose-Capillary: 292 mg/dL — ABNORMAL HIGH (ref 70–99)
Glucose-Capillary: 115 mg/dL — ABNORMAL HIGH (ref 70–99)
Glucose-Capillary: 123 mg/dL — ABNORMAL HIGH (ref 70–99)
Glucose-Capillary: 182 mg/dL — ABNORMAL HIGH (ref 70–99)
Glucose-Capillary: 176 mg/dL — ABNORMAL HIGH (ref 70–99)
Glucose-Capillary: 200 mg/dL — ABNORMAL HIGH (ref 70–99)
Glucose-Capillary: 183 mg/dL — ABNORMAL HIGH (ref 70–99)
Glucose-Capillary: 179 mg/dL — ABNORMAL HIGH (ref 70–99)
Glucose-Capillary: 128 mg/dL — ABNORMAL HIGH (ref 70–99)
Glucose-Capillary: 131 mg/dL — ABNORMAL HIGH (ref 70–99)
Glucose-Capillary: 239 mg/dL — ABNORMAL HIGH (ref 70–99)
Glucose-Capillary: 183 mg/dL — ABNORMAL HIGH (ref 70–99)
Glucose-Capillary: 138 mg/dL — ABNORMAL HIGH (ref 70–99)
Glucose-Capillary: 140 mg/dL — ABNORMAL HIGH (ref 70–99)
Glucose-Capillary: 150 mg/dL — ABNORMAL HIGH (ref 70–99)
Glucose-Capillary: 170 mg/dL — ABNORMAL HIGH (ref 70–99)
Glucose-Capillary: 148 mg/dL — ABNORMAL HIGH (ref 70–99)

## 2014-11-08 LAB — RENAL FUNCTION PANEL
ALBUMIN: 3.1 g/dL — AB (ref 3.5–5.2)
ANION GAP: 13 (ref 5–15)
BUN: 75 mg/dL — ABNORMAL HIGH (ref 6–23)
CO2: 22 mmol/L (ref 19–32)
Calcium: 9.4 mg/dL (ref 8.4–10.5)
Chloride: 97 mmol/L (ref 96–112)
Creatinine, Ser: 5.61 mg/dL — ABNORMAL HIGH (ref 0.50–1.10)
GFR calc Af Amer: 10 mL/min — ABNORMAL LOW (ref >90)
GFR calc non Af Amer: 9 mL/min — ABNORMAL LOW (ref >90)
Glucose, Bld: 279 mg/dL — ABNORMAL HIGH (ref 70–99)
Phosphorus: 6.2 mg/dL — ABNORMAL HIGH (ref 2.3–4.6)
Potassium: 4.3 mmol/L (ref 3.5–5.1)
SODIUM: 132 mmol/L — AB (ref 135–145)

## 2014-11-08 LAB — TROPONIN I: TROPONIN I: 0.34 ng/mL — AB (ref <0.031)

## 2014-11-08 MED ORDER — INSULIN GLARGINE 100 UNIT/ML ~~LOC~~ SOLN
10.0000 [IU] | Freq: Every day | SUBCUTANEOUS | Status: DC
  Filled 2014-11-08: qty 0.1

## 2014-11-08 MED ORDER — INSULIN ASPART 100 UNIT/ML ~~LOC~~ SOLN
1.0000 [IU] | SUBCUTANEOUS | Status: DC
  Administered 2014-11-09: 1 [IU] via SUBCUTANEOUS
  Administered 2014-11-09: 2 [IU] via SUBCUTANEOUS
  Administered 2014-11-09 (×2): 3 [IU] via SUBCUTANEOUS
  Administered 2014-11-10 (×2): 2 [IU] via SUBCUTANEOUS

## 2014-11-08 MED ORDER — ACETAMINOPHEN 325 MG PO TABS
650.0000 mg | ORAL_TABLET | Freq: Four times a day (QID) | ORAL | Status: DC | PRN
  Administered 2014-11-09 (×2): 650 mg via ORAL
  Filled 2014-11-08 (×2): qty 2

## 2014-11-08 MED ORDER — PERFLUTREN LIPID MICROSPHERE
1.0000 mL | INTRAVENOUS | Status: DC | PRN
  Administered 2014-11-08: 2 mL via INTRAVENOUS
  Filled 2014-11-08: qty 10

## 2014-11-08 MED ORDER — INSULIN GLARGINE 100 UNIT/ML ~~LOC~~ SOLN
20.0000 [IU] | SUBCUTANEOUS | Status: DC
  Administered 2014-11-09: 20 [IU] via SUBCUTANEOUS
  Filled 2014-11-08 (×2): qty 0.2

## 2014-11-08 MED ORDER — INSULIN GLARGINE 100 UNIT/ML ~~LOC~~ SOLN
30.0000 [IU] | SUBCUTANEOUS | Status: DC
  Administered 2014-11-08: 20 [IU] via SUBCUTANEOUS
  Filled 2014-11-08 (×2): qty 0.3

## 2014-11-08 NOTE — Progress Notes (Signed)
PT Cancellation Note  Patient Details Name: [name] MRN: [id number] DOB: 05/22/1976   Cancelled Treatment:    Reason Eval/Treat Not Completed: Medical issues which prohibited therapy (Pt with temporary femoral catheter.)   Genasis Zingale 11/08/2014, 11:28 AM

## 2014-11-08 NOTE — Progress Notes (Signed)
Fentanyl IV 240cc wasted in sink witnessed by [place] RN

## 2014-11-08 NOTE — Progress Notes (Signed)
  NUTRITION FOLLOW-UP INTERVENTION: Provide Magic Cup BID  NUTRITION DIAGNOSIS:  Inadequate oral intake related to decreased appetite as evidenced by poor PO intake at 30%.  Goal:T Pt to meet >/= 90% of their estimated nutrition needs; not met   Monitor:  PO intake, labs, weight  ASSESSMENT: 39 yo female with extensive PMH including HTN, ESRD, previous TB, DM, kidney-pancreas transplant 2010, on home O2 with witnessed arrest at home.  Patient extubated 2/1. Patient is sleeping; spoke to nurse about pt upon DI assessment. Pt had sitter, d/t attempts of pulling out IV. Pt ate 30% of breakfast this morning, 75% lunch and likes [employer]. Appetite improving.   Pt discussed during ICU rounds and with RN.  CRRT off.   Height: Ht Readings from Last 1 Encounters:  10/31/14 _0  (1.702 m)    Weight: Wt Readings from Last 1 Encounters:  11/04/14 181 lb 7 oz (82.3 kg)  Admission weight: 173 lb (78.6 kg) 1/25  BMI: Body mass index is 28.41 kg/(m^2).  Adjusted BW=78.7 kg  Estimated Nutritional Needs: Kcal: 2050-2200  Protein: 105-115 grams Fluid: 1.2 L/day  Skin: WDL   Diet Order: Renal diet with fluid restriction  Intake/Output Summary (Last 24 hours) at 11/04/14 1109 Last data filed at 11/04/14 0831  Gross per 24 hour  Intake 1555.42 ml  Output  0 ml  Net 1555.42 ml    Last BM: PTA  Labs:   Last Labs      Recent Labs Lab 11/01/14 1809  11/02/14 0500  11/02/14 1500 11/03/14 0400 11/04/14 0400  NA 134*  135 < > 134* < > 134* 133* 134*  K 6.7*  6.7* < > 4.9 < > 4.9 4.9 5.1  CL 95*  94* < > 96 < > 100 101 100  CO2 31  26 < > 28 --  _1 BUN 30*  29* < > 19 < > 18 24* 66*  CREATININE 5.82*  5.84* < > 4.32* < > 3.27* 2.74* 4.28*  CALCIUM 8.2*  8.1* < > 8.1* --  7.8* 8.0* 8.6  MG --  --  2.0 --  --  2.3 2.8*  PHOS 7.1* --  --  --   4.3 4.5 --   GLUCOSE 158*  158* < > 105* < > 142* 127* 132*  < > = values in this interval not displayed.    CBG (last 3)   Recent Labs (last 2 labs)      Recent Labs  11/04/14 0353 11/04/14 0546 11/04/14 0753  GLUCAP 88 100* 145*      Scheduled Meds: . antiseptic oral rinse 7 mL Mouth Rinse QID  . aspirin 81 mg Oral Daily  . cefTRIAXone (ROCEPHIN) IV 1 g Intravenous Q24H  . chlorhexidine 15 mL Mouth Rinse BID  . feeding supplement (NEPRO CARB STEADY) 1,000 mL Per Tube Q24H  . feeding supplement (PRO-STAT SUGAR FREE 64) 30 mL Per Tube TID  . insulin aspart 1-3 Units Subcutaneous 6 times per day  . insulin glargine 15 Units Subcutaneous Q24H  . pantoprazole (PROTONIX) IV 40 mg Intravenous Q24H    Continuous Infusions: . dextrose   . fentaNYL infusion INTRAVENOUS 25 mcg/hr (11/04/14 0800)  . norepinephrine (LEVOPHED) Adult infusion Stopped (11/02/14 1813)  . dialysate (PRISMASATE)   . dialysis replacement fluid (prismasate)   . dialysis replacement fluid (prismasate)    Wynona Dove, MS Dietetic Intern Pager: [phone]

## 2014-11-08 NOTE — Progress Notes (Signed)
Assessment: 1. Cardiac arrest /severe hyperkalemia - s/p cooling protocol, high K resolved 2. ESRD on HD TTS 3. Pulm edema / vol excess - improve 4. Anemia cont max esa, weekly IV Fe 50 5. VDRF 6. 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 Wed  Subjective: Interval History: CRRT stopped yesterday  Objective: Vital signs in last 24 hours: Temp:  [97.5 F (36.4 C)-98.7 F (37.1 C)] 97.8 F (36.6 C) (02/02 0752) Pulse Rate:  [87-117] 95 (02/02 0800) Resp:  [15-27] 20 (02/02 0900) BP: (73-129)/(31-97) 123/63 mmHg (02/02 0900) SpO2:  [98 %-100 %] 100 % (02/02 0800) FiO2 (%):  [2 %] 2 % (02/01 1200) Weight change:   Intake/Output from previous day: 02/01 0701 - 02/02 0700 In: 497.6 [I.V.:232.6; NG/GT:90; IV Piggyback:175] Out: 237  Intake/Output this shift: Total I/O In: 12.6 [I.V.:12.6] Out: -   General appearance: slowed mentation and sleepy Resp: clear to auscultation bilaterally Chest wall: tender post arrest Cardio: regular rate and rhythm, S1, S2 normal, no murmur, click, rub or gallop Extremities: extremities normal, atraumatic, no cyanosis or edema  Lab Results:  Recent Labs  11/06/14 0812 11/07/14 0418  WBC 10.4 10.2  HGB 7.4* 8.3*  HCT 23.8* 26.3*  PLT 204 348   BMET:  Recent Labs  11/06/14 1600 11/07/14 0418  NA 136 134*  K 3.8 3.7  CL 96 100  CO2 22 26  GLUCOSE 210* 217*  BUN 36* 41*  CREATININE 2.50* 2.48*  CALCIUM 8.5 9.2   No results for input(s): PTH in the last 72 hours. Iron Studies: No results for input(s): IRON, TIBC, TRANSFERRIN, FERRITIN in the last 72 hours. Studies/Results: Dg Chest Port 1 View  11/07/2014   CLINICAL DATA:  Pneumonia.  EXAM: PORTABLE CHEST - 1 VIEW  COMPARISON:  11/06/2014.  11/05/2014.  FINDINGS: Endotracheal tube and NG tube in stable position. Left IJ line in stable position. Continued improvement of bilateral pulmonary infiltrates. No pleural effusion or  pneumothorax. Slight nodularity noted in both lungs most likely represents residual mild edema. Continued follow-up suggested. No pneumothorax. No acute osseous abnormality.  IMPRESSION: 1. Lines and tubes in stable position. 2. Continued resolving congestive heart failure and pulmonary edema. Slight nodularity in noted in both lungs most likely represents mild residual edema. Continued follow-up is suggested.   Electronically Signed   By: Harrison   On: 11/07/2014 07:34    Scheduled: . aspirin  81 mg Oral Daily  . [START ON 11/12/2014] darbepoetin (ARANESP) injection - DIALYSIS  200 mcg Intravenous Q Sat-HD  . doxercalciferol  3 mcg Oral Q T,Th,Sa-HD  . feeding supplement (NEPRO CARB STEADY)  1,000 mL Per Tube Q24H  . feeding supplement (PRO-STAT SUGAR FREE 64)  60 mL Per Tube TID  . [START ON 11/10/2014] ferric gluconate (FERRLECIT/NULECIT) IV  62.5 mg Intravenous Q Thu-HD  . heparin subcutaneous  5,000 Units Subcutaneous 3 times per day  . insulin aspart  1-3 Units Subcutaneous 6 times per day  . insulin glargine  15 Units Subcutaneous Q24H  . piperacillin-tazobactam (ZOSYN)  IV  2.25 g Intravenous 3 times per day      LOS: 8 days   Evelin Cake C 11/08/2014,10:10 AM

## 2014-11-08 NOTE — Progress Notes (Signed)
Speech Language Pathology Treatment: Dysphagia  Patient Details Name: [name] MRN: [id number] DOB: 1975-11-26 Today's Date: 11/08/2014 Time: 2876-8115 SLP Time Calculation (min) (ACUTE ONLY): 15 min  Assessment / Plan / Recommendation Clinical Impression  Goal of session was to determine pt tolerance of current dys 2/ pudding thick liquid diet and possible upgrade. Pt vocal quality improved from baseline, evidenced by a louder and less breathy vocal quality. PO trials with thin liquids presented via cup and solids did not elicit s/s of aspiration or wet vocal quality. Delayed coughed observed following large consecutive sips of thin liquids presented via straw. Recommend pt upgrade to regular/ thin liquid diet. Recommend small sips when drinking thin liquids and no straws. Speech will follow-up.[name]    HPI HPI: Pt is 39 y/o female with extensive PMH including HTN, ESRD, previous TB, DM, kidney-pancreas transplant 2010, on home O2 with witnessed arrest at home. Had her HD as regularly scheduled on 1/22. Husband performed CPR. Total ~1 hour CPR during multiple episodes. CT (11/07/2014) revealed resolving congestive heart failure and pulmonary edema.   Pertinent Vitals    SLP Plan  Continue with current plan of care    Recommendations Diet recommendations: Regular;Thin liquid Liquids provided via: Cup;No straw Medication Administration: Whole meds with liquid Supervision: Intermittent supervision to cue for compensatory strategies Compensations: Slow rate;Small sips/bites Postural Changes and/or Swallow Maneuvers: Seated upright 90 degrees              Oral Care Recommendations: Oral care BID Plan: Continue with current plan of care    GO     [name], Quintessa Simmerman 11/08/2014, 12:03 PM

## 2014-11-08 NOTE — Progress Notes (Signed)
PT Cancellation Note  Patient Details Name: [name] MRN: [id number] DOB: [birth year]   Cancelled Treatment:    Reason Eval/Treat Not Completed: Medical issues which prohibited therapy (pt having chest pain).   Roshelle Traub 11/08/2014, 9:20 AM  [facility] PT [phone]

## 2014-11-08 NOTE — Progress Notes (Signed)
Echocardiogram 2D Echocardiogram limited with Definity has been performed.  [name] 11/08/2014, 4:08 PM

## 2014-11-08 NOTE — Progress Notes (Addendum)
Pt complaining of chest pain at 0900. O2 already administering at 5L. Ordered STAT 12 Lead EKG. Pt describes pain as "someone pushing on her." When nurse puts pressure on chest in described area of pain, pt moans. Pt received chest compressions on arrest in same area as where pain presents. At 0930 Pt is asleep in bed. Will continue to monitor.   At 1000, Titus Mould (MD) made aware on CP on morning rounding. Pt states no longer having chest pain to MD.

## 2014-11-08 NOTE — Progress Notes (Signed)
PULMONARY / CRITICAL CARE MEDICINE   Name: [name] MRN: [id number] DOB: 29-Nov-1975    ADMISSION DATE:  10/31/2014  REFERRING MD :  EDP  CHIEF COMPLAINT:  Cardiac arrest   INITIAL PRESENTATION: 39 yo female with extensive PMH including HTN, ESRD on HD, previous TB, DM, kidney-pancreas transplant 2010, on home O2 with witnessed arrest at home.  Had her HD as regularly scheduled on 1/22.  Husband performed CPR.  Wide complex rhythm on EMS arrival.  Total ~1 hour CPR during multiple episodes, 7 epi, 6 defib.  K+ in ER was 8.1.  PCCM called to admit.   STUDIES:  CT head 1/25: ?acute sinusitis pCXR 1/25: cardiomegaly, increasing interstitial and airspace disease EEG 1/25: Anoxic encephalopathy Echo 1/26: severely reduced EF 25-30%, moderate LVH, grade 2 DD, PA pressure 52mmHg, trivial pericardial effusion EEG 1/29: diffuse cerebral dysfunction, non-specific etiology, no seizures  SIGNIFICANT EVENTS: 1/26: admit s/p cardiac arrest, HD, hypothermia protocol, hypotensive during HD 1/26: CVVHD started 1/27: warmed 1/28: Following some commands 1/29: Fevers, zosyn restarted 1/30: afebrile, +BM 1/31: Pressure support ventilation for > 8 hours, 2L volume removed 2/1- extubation  SUBJECTIVE: remains in shock on pressors  VITAL SIGNS: Temp:  [97.5 F (36.4 C)-98.7 F (37.1 C)] 97.8 F (36.6 C) (02/02 0752) Pulse Rate:  [87-117] 95 (02/02 0800) Resp:  [15-27] 20 (02/02 0900) BP: (73-129)/(31-97) 123/63 mmHg (02/02 0900) SpO2:  [98 %-100 %] 100 % (02/02 0800) FiO2 (%):  [2 %] 2 % (02/01 1200) HEMODYNAMICS:   VENTILATOR SETTINGS: Vent Mode:  [-]  FiO2 (%):  [2 %] 2 % INTAKE / OUTPUT:  Intake/Output Summary (Last 24 hours) at 11/08/14 1019 Last data filed at 11/08/14 0900  Gross per 24 hour  Intake  366.4 ml  Output      0 ml  Net  366.4 ml   PHYSICAL EXAMINATION: General:  Awake, extubated HEENT: NCAT PULM: CTA anterior CV: RRR, no mgr s1 s2  AB: BS+, soft, nontender Ext:  warm, trace edema Neuro: Awake and alert, following commands well  LABS:  CBC  Recent Labs Lab 11/05/14 0430 11/06/14 0812 11/07/14 0418  WBC 14.3* 10.4 10.2  HGB 7.9* 7.4* 8.3*  HCT 24.5* 23.8* 26.3*  PLT 175 204 348   Coag's  Recent Labs Lab 11/05/14 0430 11/06/14 0358 11/07/14 0418  APTT 37 36 36   BMET  Recent Labs Lab 11/06/14 0358 11/06/14 1600 11/07/14 0418  NA 134* 136 134*  K 3.8 3.8 3.7  CL 99 96 100  CO2 26 22 26   BUN 46* 36* 41*  CREATININE 2.74* 2.50* 2.48*  GLUCOSE 174* 210* 217*   Electrolytes  Recent Labs Lab 11/04/14 1512 11/05/14 0430 11/05/14 1611 11/06/14 0358 11/06/14 1600 11/07/14 0418  CALCIUM 8.6 8.7 8.6 8.6 8.5 9.2  MG  --  2.8*  --  2.7*  --  3.0*  PHOS 4.3  --  4.0  --  3.2  --    ABG  Recent Labs Lab 11/05/14 0455 11/06/14 1039 11/07/14 0332  PHART 7.352 7.360 7.395  PCO2ART 46.1* 44.5 39.6  PO2ART 103.0* 118.0* 153.0*   Cardiac Enzymes No results for input(s): TROPONINI, PROBNP in the last 168 hours. Glucose  Recent Labs Lab 11/08/14 0103 11/08/14 0205 11/08/14 0300 11/08/14 0410 11/08/14 0507 11/08/14 0604  GLUCAP 115* 123* 182* 176* 200* 183*   Imaging Dg Chest Port 1 View  11/07/2014   CLINICAL DATA:  Pneumonia.  EXAM: PORTABLE CHEST - 1 VIEW  COMPARISON:  11/06/2014.  11/05/2014.  FINDINGS: Endotracheal tube and NG tube in stable position. Left IJ line in stable position. Continued improvement of bilateral pulmonary infiltrates. No pleural effusion or pneumothorax. Slight nodularity noted in both lungs most likely represents residual mild edema. Continued follow-up suggested. No pneumothorax. No acute osseous abnormality.  IMPRESSION: 1. Lines and tubes in stable position. 2. Continued resolving congestive heart failure and pulmonary edema. Slight nodularity in noted in both lungs most likely represents mild residual edema. Continued follow-up is suggested.   Electronically Signed   By: Marcello Moores  Register    On: 11/07/2014 07:34   ASSESSMENT / PLAN:  PULMONARY OETT 1/25>>> 2/1 Acute respiratory failure with hypoxemia  ?aspiration PNA  Hx TB  Pulm edema> improving P:   IS mobilize  CARDIOVASCULAR CVL R fem 1/25>>>1/26 R fem aline 1/25>>>1/28 IJ  cvl left 1/26>>>self 2/1 Left fem hd 1/26>>>  Cardiac arrest - presumed r/t hyperkalemia in setting ESRD  Hx HTN  Cardiogenic shock> resolved Prolonged qtc > resolved 1/26 EKG Cariomyopathy  SHe had Aline and cuff discrepency -accept lower MAP  P:  Cvvhd to off 2/1 Per renal pcxr eval important for volume status Levophed to MAP goal 55 Had a borderline cortisol prior, repeat  RENAL ESRD on HD, T,Th,Sat Hyperkalemia > resolved Hyponatremia P:   CVVHD per renal Monitor BMET and UOP Replace electrolytes as needed  GASTROINTESTINAL Protein calorie malnutrition  P:   Diet as able  HEMATOLOGIC Anemia (dilutional, esrd) DVT prevention Thrombocytopenia > resolved  P:  DVT prophylaxis Aranesp  INFECTIOUS Pneumonia> presumably aspiration, improving  P:   Unasyn 1/25>>1/26 Vancomycin 1/26>>>1/28 Zosyn 1/26>>1/28>>1/29>>> Ceftriaxone 1/28>>1/29 Blood cx x2 1/26>>NGTD BC 1/29>>> sputum cultures 1/26: Moderate Enterobacter sensitive to cipro and pip/tazo, resistant to cefazolin Procal: 160.15-->104.98-->68.36 Cdiff 1/30 neg  Maintain zosyn 7 days total, then dc - responded  ENDOCRINE Hyperglycemia, r/o burn out adrenal P:   SSI Lantus off, re add with goal to dc drip diet  NEUROLOGIC Anoxic encephalopathy post cardiac arrest> improving P:   Fentanyl prn Mobilize Pt, ot needed  FAMILY  - Updates:  Updated husband by bedside daily, 2/1 included  - Inter-disciplinary family meet or Palliative Care meeting due by:  2/1  My cc time 30 minutes  Lavon Paganini. Titus Mould, MD, [place] Pgr: [facility]

## 2014-11-09 ENCOUNTER — Inpatient Hospital Stay (HOSPITAL_COMMUNITY): Payer: Medicare Other

## 2014-11-09 LAB — RENAL FUNCTION PANEL
Albumin: 2.5 g/dL — ABNORMAL LOW (ref 3.5–5.2)
Anion gap: 17 — ABNORMAL HIGH (ref 5–15)
BUN: 101 mg/dL — ABNORMAL HIGH (ref 6–23)
CALCIUM: 8.6 mg/dL (ref 8.4–10.5)
CO2: 18 mmol/L — AB (ref 19–32)
CREATININE: 7.88 mg/dL — AB (ref 0.50–1.10)
Chloride: 91 mmol/L — ABNORMAL LOW (ref 96–112)
GFR calc Af Amer: 7 mL/min — ABNORMAL LOW (ref >90)
GFR, EST NON AFRICAN AMERICAN: 6 mL/min — AB (ref >90)
GLUCOSE: 164 mg/dL — AB (ref 70–99)
PHOSPHORUS: 9.4 mg/dL — AB (ref 2.3–4.6)
Potassium: 3.8 mmol/L (ref 3.5–5.1)
Sodium: 126 mmol/L — ABNORMAL LOW (ref 135–145)

## 2014-11-09 LAB — GLUCOSE, CAPILLARY
GLUCOSE-CAPILLARY: 124 mg/dL — AB (ref 70–99)
GLUCOSE-CAPILLARY: 159 mg/dL — AB (ref 70–99)
GLUCOSE-CAPILLARY: 220 mg/dL — AB (ref 70–99)
Glucose-Capillary: 108 mg/dL — ABNORMAL HIGH (ref 70–99)
Glucose-Capillary: 120 mg/dL — ABNORMAL HIGH (ref 70–99)
Glucose-Capillary: 127 mg/dL — ABNORMAL HIGH (ref 70–99)
Glucose-Capillary: 217 mg/dL — ABNORMAL HIGH (ref 70–99)

## 2014-11-09 LAB — CBC
HCT: 21.6 % — ABNORMAL LOW (ref 36.0–46.0)
HEMOGLOBIN: 7.3 g/dL — AB (ref 12.0–15.0)
MCH: 30.2 pg (ref 26.0–34.0)
MCHC: 33.8 g/dL (ref 30.0–36.0)
MCV: 89.3 fL (ref 78.0–100.0)
Platelets: 427 10*3/uL — ABNORMAL HIGH (ref 150–400)
RBC: 2.42 MIL/uL — AB (ref 3.87–5.11)
RDW: 18.1 % — ABNORMAL HIGH (ref 11.5–15.5)
WBC: 9.8 10*3/uL (ref 4.0–10.5)

## 2014-11-09 LAB — CORTISOL: Cortisol, Plasma: 22.5 ug/dL

## 2014-11-09 LAB — HEPATITIS B SURFACE ANTIGEN: HEP B S AG: NEGATIVE

## 2014-11-09 MED ORDER — LIDOCAINE HCL (PF) 1 % IJ SOLN: 5.0000 mL | INTRAMUSCULAR | Status: DC | PRN

## 2014-11-09 MED ORDER — SORBITOL 70 % SOLN
30.0000 mL | Status: DC | PRN
  Filled 2014-11-09: qty 30

## 2014-11-09 MED ORDER — NEPRO/CARBSTEADY PO LIQD
237.0000 mL | ORAL | Status: DC | PRN
  Filled 2014-11-09: qty 237

## 2014-11-09 MED ORDER — HYDROXYZINE HCL 25 MG PO TABS
25.0000 mg | ORAL_TABLET | Freq: Three times a day (TID) | ORAL | Status: DC | PRN
  Administered 2014-11-09 – 2014-11-11 (×3): 25 mg via ORAL
  Filled 2014-11-09 (×3): qty 1

## 2014-11-09 MED ORDER — ALTEPLASE 2 MG IJ SOLR
2.0000 mg | Freq: Once | INTRAMUSCULAR | Status: DC | PRN
  Filled 2014-11-09: qty 2

## 2014-11-09 MED ORDER — HEPARIN SODIUM (PORCINE) 1000 UNIT/ML DIALYSIS: 20.0000 [IU]/kg | INTRAMUSCULAR | Status: DC | PRN

## 2014-11-09 MED ORDER — CAMPHOR-MENTHOL 0.5-0.5 % EX LOTN
1.0000 "application " | TOPICAL_LOTION | Freq: Three times a day (TID) | CUTANEOUS | Status: DC | PRN
  Filled 2014-11-09: qty 222

## 2014-11-09 MED ORDER — HYDROXYZINE HCL 25 MG PO TABS
ORAL_TABLET | ORAL | Status: AC
  Filled 2014-11-09: qty 1

## 2014-11-09 MED ORDER — SODIUM CHLORIDE 0.9 % IV SOLN: 100.0000 mL | INTRAVENOUS | Status: DC | PRN

## 2014-11-09 MED ORDER — ONDANSETRON HCL 4 MG/2ML IJ SOLN: 4.0000 mg | Freq: Four times a day (QID) | INTRAMUSCULAR | Status: DC | PRN

## 2014-11-09 MED ORDER — CETYLPYRIDINIUM CHLORIDE 0.05 % MT LIQD
7.0000 mL | Freq: Two times a day (BID) | OROMUCOSAL | Status: DC
  Administered 2014-11-09 – 2014-11-16 (×11): 7 mL via OROMUCOSAL

## 2014-11-09 MED ORDER — ACETAMINOPHEN 325 MG PO TABS
650.0000 mg | ORAL_TABLET | Freq: Four times a day (QID) | ORAL | Status: DC | PRN
  Administered 2014-11-09 – 2014-11-16 (×7): 650 mg via ORAL
  Filled 2014-11-09 (×6): qty 2

## 2014-11-09 MED ORDER — LIDOCAINE-PRILOCAINE 2.5-2.5 % EX CREA
1.0000 "application " | TOPICAL_CREAM | CUTANEOUS | Status: DC | PRN
  Filled 2014-11-09: qty 5

## 2014-11-09 MED ORDER — PENTAFLUOROPROP-TETRAFLUOROETH EX AERO: 1.0000 "application " | INHALATION_SPRAY | CUTANEOUS | Status: DC | PRN

## 2014-11-09 MED ORDER — ACETAMINOPHEN 650 MG RE SUPP: 650.0000 mg | Freq: Four times a day (QID) | RECTAL | Status: DC | PRN

## 2014-11-09 MED ORDER — HEPARIN SODIUM (PORCINE) 1000 UNIT/ML DIALYSIS: 1000.0000 [IU] | INTRAMUSCULAR | Status: DC | PRN

## 2014-11-09 MED ORDER — DOCUSATE SODIUM 283 MG RE ENEM
1.0000 | ENEMA | RECTAL | Status: DC | PRN
  Filled 2014-11-09: qty 1

## 2014-11-09 MED ORDER — FENTANYL CITRATE 0.05 MG/ML IJ SOLN
INTRAMUSCULAR | Status: AC
  Administered 2014-11-09: 25 ug via INTRAVENOUS
  Filled 2014-11-09: qty 2

## 2014-11-09 MED ORDER — ONDANSETRON HCL 4 MG PO TABS: 4.0000 mg | ORAL_TABLET | Freq: Four times a day (QID) | ORAL | Status: DC | PRN

## 2014-11-09 MED ORDER — CALCIUM CARBONATE 1250 MG/5ML PO SUSP
500.0000 mg | Freq: Four times a day (QID) | ORAL | Status: DC | PRN
  Filled 2014-11-09: qty 5

## 2014-11-09 MED ORDER — FENTANYL CITRATE 0.05 MG/ML IJ SOLN
INTRAMUSCULAR | Status: AC
  Filled 2014-11-09: qty 2

## 2014-11-09 MED ORDER — NEPRO/CARBSTEADY PO LIQD
237.0000 mL | Freq: Three times a day (TID) | ORAL | Status: DC | PRN
  Filled 2014-11-09: qty 237

## 2014-11-09 NOTE — Progress Notes (Signed)
PULMONARY / CRITICAL CARE MEDICINE   Name: DIERDRA SALAMEH MRN: [id number] DOB: 26-Jan-1976    ADMISSION DATE:  10/31/2014  REFERRING MD :  EDP  CHIEF COMPLAINT:  Cardiac arrest   INITIAL PRESENTATION: 39 yo female with extensive PMH including HTN, ESRD on HD, previous TB, DM, kidney-pancreas transplant 2010, on home O2 with witnessed arrest at home.  Had her HD as regularly scheduled on 1/22.  Husband performed CPR.  Wide complex rhythm on EMS arrival.  Total ~1 hour CPR during multiple episodes, 7 epi, 6 defib.  K+ in ER was 8.1.  PCCM called to admit.   STUDIES:  CT head 1/25: ?acute sinusitis pCXR 1/25: cardiomegaly, increasing interstitial and airspace disease EEG 1/25: Anoxic encephalopathy Echo 1/26: severely reduced EF 25-30%, moderate LVH, grade 2 DD, PA pressure 40mmHg, trivial pericardial effusion EEG 1/29: diffuse cerebral dysfunction, non-specific etiology, no seizures  SIGNIFICANT EVENTS: 1/26: admit s/p cardiac arrest, HD, hypothermia protocol, hypotensive during HD 1/26: CVVHD started 1/27: warmed 1/28: Following some commands 1/29: Fevers, zosyn restarted 1/30: afebrile, +BM 1/31: Pressure support ventilation for > 8 hours, 2L volume removed 2/1- extubation  SUBJECTIVE: off pressors  VITAL SIGNS: Temp:  [96.5 F (35.8 C)-98.2 F (36.8 C)] 97.4 F (36.3 C) (02/03 0800) Pulse Rate:  [82-92] 86 (02/03 1100) Resp:  [18-28] 22 (02/03 1100) BP: (84-150)/(36-72) 98/52 mmHg (02/03 1100) SpO2:  [97 %-100 %] 99 % (02/03 1100) HEMODYNAMICS:   VENTILATOR SETTINGS:   INTAKE / OUTPUT:  Intake/Output Summary (Last 24 hours) at 11/09/14 1210 Last data filed at 11/09/14 0500  Gross per 24 hour  Intake 913.28 ml  Output      0 ml  Net 913.28 ml   PHYSICAL EXAMINATION: General:  Awake, extubated, no distress HEENT: NCAT PULM: coarse slight CV: RRR, no mgr s1 s2  AB: BS+, soft, nontender Ext: warm, trace edema Neuro: Awake and alert, following commands  well  LABS:  CBC  Recent Labs Lab 11/06/14 0812 11/07/14 0418 11/08/14 1053  WBC 10.4 10.2 11.5*  HGB 7.4* 8.3* 8.8*  HCT 23.8* 26.3* 27.8*  PLT 204 348 463*   Coag's  Recent Labs Lab 11/05/14 0430 11/06/14 0358 11/07/14 0418  APTT 37 36 36   BMET  Recent Labs Lab 11/06/14 1600 11/07/14 0418 11/08/14 1053  NA 136 134* 132*  K 3.8 3.7 4.3  CL 96 100 97  CO2 22 26 22   BUN 36* 41* 75*  CREATININE 2.50* 2.48* 5.61*  GLUCOSE 210* 217* 279*   Electrolytes  Recent Labs Lab 11/05/14 0430 11/05/14 1611 11/06/14 0358 11/06/14 1600 11/07/14 0418 11/08/14 1053  CALCIUM 8.7 8.6 8.6 8.5 9.2 9.4  MG 2.8*  --  2.7*  --  3.0*  --   PHOS  --  4.0  --  3.2  --  6.2*   ABG  Recent Labs Lab 11/05/14 0455 11/06/14 1039 11/07/14 0332  PHART 7.352 7.360 7.395  PCO2ART 46.1* 44.5 39.6  PO2ART 103.0* 118.0* 153.0*   Cardiac Enzymes  Recent Labs Lab 11/08/14 1053  TROPONINI 0.34*   Glucose  Recent Labs Lab 11/08/14 2130 11/08/14 2233 11/08/14 2355 11/09/14 0441 11/09/14 0744 11/09/14 1201  GLUCAP 148* 127* 120* 159* 124* 217*   Imaging No results found. ASSESSMENT / PLAN:  PULMONARY OETT 1/25>>> 2/1 Acute respiratory failure with hypoxemia  ?aspiration PNA  Hx TB  Pulm edema> improving P:   IS Mobilize Hd as some increase edema on pcxr  CARDIOVASCULAR CVL R fem  1/25>>>1/26 R fem aline 1/25>>>1/28 IJ  cvl left 1/26>>>self 2/1 Left fem hd 1/26>>>  Cardiac arrest - presumed r/t hyperkalemia in setting ESRD  Hx HTN  Cardiogenic shock> resolved Prolonged qtc > resolved 1/26 EKG Cariomyopathy  SHe had Aline and cuff discrepency -accept lower MAP  P:  Per renal Levophed to MAP goal 55 as inaccurate cuff - off Had a borderline cortisol prior, repeat -22, no role steroids  RENAL ESRD on HD, T,Th,Sat Hyperkalemia > resolved Hyponatremia P:   HD, hope she tolerates this pcxr indicates need  GASTROINTESTINAL Protein calorie  malnutrition  P:   Diet as able  HEMATOLOGIC Anemia (dilutional, esrd) DVT prevention Thrombocytopenia > resolved  P:  DVT prophylaxis Aranesp  INFECTIOUS Pneumonia> presumably aspiration, improving  P:   Unasyn 1/25>>1/26 Vancomycin 1/26>>>1/28 Zosyn 1/26>>1/28>>1/29>>> Ceftriaxone 1/28>>1/29 Blood cx x2 1/26>>NGTD BC 1/29>>> sputum cultures 1/26: Moderate Enterobacter sensitive to cipro and pip/tazo, resistant to cefazolin Procal: 160.15-->104.98-->68.36 Cdiff 1/30 neg  Dc zosyn today  ENDOCRINE Hyperglycemia, r/o burn out adrenal P:   SSI Lantus maintain, dri just off ssi diet  NEUROLOGIC Anoxic encephalopathy post cardiac arrest> improving P:   Fentanyl prn Mobilize Pt, ot needed  FAMILY  - Updates:  Updated husband by bedside daily, 2/1 included  - Inter-disciplinary family meet or Palliative Care meeting due by:  2/1  Consider to sdu, triad  Lavon Paganini. Titus Mould, MD, [place] Pgr: [facility]

## 2014-11-09 NOTE — Progress Notes (Signed)
PT Cancellation Note  Patient Details Name: [name] MRN: [id number] DOB: Mar 13, 1976   Cancelled Treatment:    Reason Eval/Treat Not Completed: Medical issues which prohibited therapy (Pt with temporary femoral catheter.) Also per neph note, HD today temp cath to be removed, will evaluate next day.   Duncan Dull 11/09/2014, 11:43 AM Alben Deeds, PT DPT  [phone]

## 2014-11-09 NOTE — Progress Notes (Signed)
Speech Language Pathology Treatment: Dysphagia  Patient Details Name: KENIYA SCHLOTTERBECK MRN: [id number] DOB: Oct 25, 1975 Today's Date: 11/09/2014 Time: 9381-8299 SLP Time Calculation (min) (ACUTE ONLY): 10 min  Assessment / Plan / Recommendation Clinical Impression  Pt now with much clearer vocal quality, able to consume large consecutive sips of thin liquids via straw without any immediate or delayed cough. All goals met, pt able to continue diet without restriction. No SLP f/u needed, sign off.    HPI HPI: Pt is 39 y/o female with extensive PMH including HTN, ESRD, previous TB, DM, kidney-pancreas transplant 2010, on home O2 with witnessed arrest at home. Had her HD as regularly scheduled on 1/22. Husband performed CPR. Total ~1 hour CPR during multiple episodes. CT (11/07/2014) revealed resolving congestive heart failure and pulmonary edema.   Pertinent Vitals    SLP Plan  All goals met    Recommendations Diet recommendations: Regular;Thin liquid Liquids provided via: Cup;Straw Medication Administration: Whole meds with liquid Supervision: Patient able to self feed Postural Changes and/or Swallow Maneuvers: Seated upright 90 degrees              Oral Care Recommendations: Oral care BID Plan: All goals met    GO    Herbie [place], MA CCC-SLP [phone]   Lynann Beaver 11/09/2014, 10:33 AM

## 2014-11-09 NOTE — Progress Notes (Signed)
Pt HD tx complete. Vss, alert, 1L removed. Report called to [place] primary RN.

## 2014-11-09 NOTE — Progress Notes (Signed)
  Assessment: 1. Cardiac arrest /severe hyperkalemia - s/p cooling protocol, high K resolved 2. ESRD on HD TTS, s/p CRRT 3. Pulm edema / vol excess - recurrent  Will do dialysis today 4. Anemia cont max esa, weekly IV Fe 50 5. VDRF S/P extubation 6. Hx failed KP transplant   Plan - HD today, but concerned about low BP.  Remove femoral catheter.   Subjective: Interval History: has no complaint of SOB & wants dialysis.  Objective: Vital signs in last 24 hours: Temp:  [96.5 F (35.8 C)-98.2 F (36.8 C)] 97.4 F (36.3 C) (02/03 0800) Pulse Rate:  [82-92] 86 (02/03 1100) Resp:  [18-28] 22 (02/03 1100) BP: (84-150)/(36-72) 98/52 mmHg (02/03 1100) SpO2:  [97 %-100 %] 99 % (02/03 1100) Weight change:   Intake/Output from previous day: 02/02 0701 - 02/03 0700 In: 1065.9 [P.O.:840; I.V.:125.9; IV Piggyback:100] Out: -  Intake/Output this shift:    General appearance: alert and cooperative Resp: clear to auscultation bilaterally Chest wall: tender to pressure, eschar lower ant CW GI: soft, non-tender; bowel sounds normal; no masses,  no organomegaly Extremities: left fem cath  Lab Results:  Recent Labs  11/07/14 0418 11/08/14 1053  WBC 10.2 11.5*  HGB 8.3* 8.8*  HCT 26.3* 27.8*  PLT 348 463*   BMET:  Recent Labs  11/07/14 0418 11/08/14 1053  NA 134* 132*  K 3.7 4.3  CL 100 97  CO2 26 22  GLUCOSE 217* 279*  BUN 41* 75*  CREATININE 2.48* 5.61*  CALCIUM 9.2 9.4   No results for input(s): PTH in the last 72 hours. Iron Studies: No results for input(s): IRON, TIBC, TRANSFERRIN, FERRITIN in the last 72 hours. Studies/Results: Dg Chest Port 1 View  11/09/2014   CLINICAL DATA:  Chest discomfort, shortness of breath. Cardiac arrest.  EXAM: PORTABLE CHEST - 1 VIEW  COMPARISON:  11/07/2014, 11/06/2014  FINDINGS: The endotracheal tube, enteric tube, and left central line have been removed. There is mild worsening of vascular congestion. Probable mild residual pulmonary  edema. Bibasilar atelectasis is seen. Cardiomediastinal contours are unchanged. There is no large pleural effusion. No pneumothorax. No acute osseous abnormalities.  IMPRESSION: Mild worsening of vascular congestion. Unchanged bibasilar atelectasis.   Electronically Signed   By: Jeb Levering M.D.   On: 11/09/2014 04:52   Scheduled: . antiseptic oral rinse  7 mL Mouth Rinse BID  . aspirin  81 mg Oral Daily  . [START ON 11/12/2014] darbepoetin (ARANESP) injection - DIALYSIS  200 mcg Intravenous Q Sat-HD  . doxercalciferol  3 mcg Oral Q T,Th,Sa-HD  . [START ON 11/10/2014] ferric gluconate (FERRLECIT/NULECIT) IV  62.5 mg Intravenous Q Thu-HD  . heparin subcutaneous  5,000 Units Subcutaneous 3 times per day  . insulin aspart  1-3 Units Subcutaneous 6 times per day  . insulin glargine  20 Units Subcutaneous Q24H  . piperacillin-tazobactam (ZOSYN)  IV  2.25 g Intravenous 3 times per day     LOS: 9 days   Macai Sisneros C 11/09/2014,11:14 AM

## 2014-11-10 LAB — GLUCOSE, CAPILLARY
GLUCOSE-CAPILLARY: 136 mg/dL — AB (ref 70–99)
Glucose-Capillary: 92 mg/dL (ref 70–99)
Glucose-Capillary: 85 mg/dL (ref 70–99)
Glucose-Capillary: 146 mg/dL — ABNORMAL HIGH (ref 70–99)
Glucose-Capillary: 92 mg/dL (ref 70–99)

## 2014-11-10 LAB — CULTURE, BLOOD (ROUTINE X 2): Culture: NO GROWTH

## 2014-11-10 MED ORDER — INSULIN ASPART 100 UNIT/ML ~~LOC~~ SOLN: 1.0000 [IU] | Freq: Three times a day (TID) | SUBCUTANEOUS | Status: DC

## 2014-11-10 MED ORDER — INSULIN GLARGINE 100 UNIT/ML ~~LOC~~ SOLN
10.0000 [IU] | SUBCUTANEOUS | Status: DC
  Filled 2014-11-10 (×2): qty 0.1

## 2014-11-10 NOTE — Progress Notes (Signed)
Assessment: 1. Cardiac arrest /severe hyperkalemia - s/p cooling protocol, high K resolved 2. ESRD on HD TTS, s/p CRRT.  I do not understand why pt more azotemic unless GI bleeding with drop in hgb 3. Pulm edema / vol excess - - One liter off yesterday 4. Anemia, worse.   cont max esa, weekly IV Fe  5. VDRF S/P extubation 6. Hx failed KP transplant   Plan - HD Friday   Subjective: Feels better cp persists  Objective: Vital signs in last 24 hours: Temp:  [97.5 F (36.4 C)-98.3 F (36.8 C)] 98.3 F (36.8 C) (02/04 0409) Pulse Rate:  [86-103] 92 (02/04 0409) Resp:  [18-31] 18 (02/04 0409) BP: (80-127)/(34-72) 102/56 mmHg (02/04 0409) SpO2:  [94 %-100 %] 94 % (02/04 0409) Weight:  [72 kg (158 lb 11.7 oz)-72.2 kg (159 lb 2.8 oz)] 72 kg (158 lb 11.7 oz) (02/03 1902) Weight change:   Intake/Output from previous day: 02/03 0701 - 02/04 0700 In: 940 [P.O.:940] Out: 1071  Intake/Output this shift:    General appearance: alert and cooperative Resp: clear to auscultation bilaterally Chest wall: tender anterior CW Cardio: regular rate and rhythm, S1, S2 normal, no murmur, click, rub or gallop Extremities: edema tr  Lab Results:  Recent Labs  11/08/14 1053 11/09/14 1500  WBC 11.5* 9.8  HGB 8.8* 7.3*  HCT 27.8* 21.6*  PLT 463* 427*   BMET:  Recent Labs  11/08/14 1053 11/09/14 1500  NA 132* 126*  K 4.3 3.8  CL 97 91*  CO2 22 18*  GLUCOSE 279* 164*  BUN 75* 101*  CREATININE 5.61* 7.88*  CALCIUM 9.4 8.6   No results for input(s): PTH in the last 72 hours. Iron Studies: No results for input(s): IRON, TIBC, TRANSFERRIN, FERRITIN in the last 72 hours. Studies/Results: Dg Chest Port 1 View  11/09/2014   CLINICAL DATA:  Chest discomfort, shortness of breath. Cardiac arrest.  EXAM: PORTABLE CHEST - 1 VIEW  COMPARISON:  11/07/2014, 11/06/2014  FINDINGS: The endotracheal tube, enteric tube, and left central line have been removed. There is mild worsening of vascular  congestion. Probable mild residual pulmonary edema. Bibasilar atelectasis is seen. Cardiomediastinal contours are unchanged. There is no large pleural effusion. No pneumothorax. No acute osseous abnormalities.  IMPRESSION: Mild worsening of vascular congestion. Unchanged bibasilar atelectasis.   Electronically Signed   By: Jeb Levering M.D.   On: 11/09/2014 04:52   Scheduled: . antiseptic oral rinse  7 mL Mouth Rinse BID  . aspirin  81 mg Oral Daily  . [START ON 11/12/2014] darbepoetin (ARANESP) injection - DIALYSIS  200 mcg Intravenous Q Sat-HD  . doxercalciferol  3 mcg Oral Q T,Th,Sa-HD  . ferric gluconate (FERRLECIT/NULECIT) IV  62.5 mg Intravenous Q Thu-HD  . heparin subcutaneous  5,000 Units Subcutaneous 3 times per day  . insulin aspart  1-3 Units Subcutaneous 6 times per day  . insulin glargine  20 Units Subcutaneous Q24H     LOS: 10 days   Xochil Shanker C 11/10/2014,8:43 AM

## 2014-11-10 NOTE — Progress Notes (Signed)
Inpatient Diabetes Program Recommendations  AACE/ADA: New Consensus Statement on Inpatient Glycemic Control (2013)  Target Ranges:  Prepandial:   less than 140 mg/dL      Peak postprandial:   less than 180 mg/dL (1-2 hours)      Critically ill patients:  140 - 180 mg/dL   Inpatient Diabetes Program Recommendations Insulin - Meal Coverage: add Novolog 3 units TID with meals per Glycemic Control Order set Thank you  Raoul Pitch BSN, RN,CDE Inpatient Diabetes Coordinator [phone] (team pager)

## 2014-11-10 NOTE — Progress Notes (Signed)
PULMONARY / CRITICAL CARE MEDICINE   Name: [name] MRN: [id number] DOB: 01/07/1976    ADMISSION DATE:  10/31/2014  REFERRING MD :  EDP  CHIEF COMPLAINT:  Cardiac arrest   INITIAL PRESENTATION: 39 yo female with extensive PMH including HTN, ESRD on HD, previous TB, DM, kidney-pancreas transplant 2010, on home O2 with witnessed arrest at home.  Had her HD as regularly scheduled on 1/22.  Husband performed CPR.  Wide complex rhythm on EMS arrival.  Total ~1 hour CPR during multiple episodes, 7 epi, 6 defib.  K+ in ER was 8.1.  PCCM called to admit.   STUDIES:  CT head 1/25: ?acute sinusitis pCXR 1/25: cardiomegaly, increasing interstitial and airspace disease EEG 1/25: Anoxic encephalopathy Echo 1/26: severely reduced EF 25-30%, moderate LVH, grade 2 DD, PA pressure 59mmHg, trivial pericardial effusion EEG 1/29: diffuse cerebral dysfunction, non-specific etiology, no seizures Echo repeat 2/2>>>normlaized EF 50%  SIGNIFICANT EVENTS: 1/26: admit s/p cardiac arrest, HD, hypothermia protocol, hypotensive during HD 1/26: CVVHD started 1/27: warmed 1/28: Following some commands 1/29: Fevers, zosyn restarted 1/30: afebrile, +BM 1/31: Pressure support ventilation for > 8 hours, 2L volume removed 2/1- extubation 2/3- tolerated HD well, no pressors initiated  SUBJECTIVE: in chair smiling  VITAL SIGNS: Temp:  [97.5 F (36.4 C)-98.3 F (36.8 C)] 97.7 F (36.5 C) (02/04 0844) Pulse Rate:  [86-103] 92 (02/04 0409) Resp:  [18-31] 18 (02/04 0409) BP: (80-128)/(34-71) 128/45 mmHg (02/04 0844) SpO2:  [94 %-100 %] 94 % (02/04 0409) Weight:  [72 kg (158 lb 11.7 oz)-72.2 kg (159 lb 2.8 oz)] 72 kg (158 lb 11.7 oz) (02/03 1902) HEMODYNAMICS:   VENTILATOR SETTINGS:   INTAKE / OUTPUT:  Intake/Output Summary (Last 24 hours) at 11/10/14 0955 Last data filed at 11/10/14 0400  Gross per 24 hour  Intake    700 ml  Output   1071 ml  Net   -371 ml   PHYSICAL EXAMINATION: General:  Awake ,  calm HEENT: NCAT PULM: cta CV: RRR, no mgr s1 s2  AB: BS+, soft, nontender Ext: warm, resolved gen edema Neuro: Awake and alert, following commands well, in a chair  LABS:  CBC  Recent Labs Lab 11/07/14 0418 11/08/14 1053 11/09/14 1500  WBC 10.2 11.5* 9.8  HGB 8.3* 8.8* 7.3*  HCT 26.3* 27.8* 21.6*  PLT 348 463* 427*   Coag's  Recent Labs Lab 11/05/14 0430 11/06/14 0358 11/07/14 0418  APTT 37 36 36   BMET  Recent Labs Lab 11/07/14 0418 11/08/14 1053 11/09/14 1500  NA 134* 132* 126*  K 3.7 4.3 3.8  CL 100 97 91*  CO2 26 22 18*  BUN 41* 75* 101*  CREATININE 2.48* 5.61* 7.88*  GLUCOSE 217* 279* 164*   Electrolytes  Recent Labs Lab 11/05/14 0430  11/06/14 0358 11/06/14 1600 11/07/14 0418 11/08/14 1053 11/09/14 1500  CALCIUM 8.7  < > 8.6 8.5 9.2 9.4 8.6  MG 2.8*  --  2.7*  --  3.0*  --   --   PHOS  --   < >  --  3.2  --  6.2* 9.4*  < > = values in this interval not displayed. ABG  Recent Labs Lab 11/05/14 0455 11/06/14 1039 11/07/14 0332  PHART 7.352 7.360 7.395  PCO2ART 46.1* 44.5 39.6  PO2ART 103.0* 118.0* 153.0*   Cardiac Enzymes  Recent Labs Lab 11/08/14 1053  TROPONINI 0.34*   Glucose  Recent Labs Lab 11/09/14 0744 11/09/14 1201 11/09/14 1954 11/09/14 2343 11/10/14 0417  11/10/14 0746  GLUCAP 124* 217* 108* 220* 92 85   Imaging Dg Chest Port 1 View  11/09/2014   CLINICAL DATA:  Chest discomfort, shortness of breath. Cardiac arrest.  EXAM: PORTABLE CHEST - 1 VIEW  COMPARISON:  11/07/2014, 11/06/2014  FINDINGS: The endotracheal tube, enteric tube, and left central line have been removed. There is mild worsening of vascular congestion. Probable mild residual pulmonary edema. Bibasilar atelectasis is seen. Cardiomediastinal contours are unchanged. There is no large pleural effusion. No pneumothorax. No acute osseous abnormalities.  IMPRESSION: Mild worsening of vascular congestion. Unchanged bibasilar atelectasis.   Electronically  Signed   By: Jeb Levering M.D.   On: 11/09/2014 04:52   ASSESSMENT / PLAN:  PULMONARY OETT 1/25>>> 2/1 Acute respiratory failure with hypoxemia  ?aspiration PNA  Hx TB  Pulm edema> improving P:   IS Mobilize Successful in chair ambulating  CARDIOVASCULAR CVL R fem 1/25>>>1/26 R fem aline 1/25>>>1/28 IJ  cvl left 1/26>>>self 2/1 Left fem hd 1/26>>>  Cardiac arrest - presumed r/t hyperkalemia in setting ESRD  Hx HTN  Prolonged qtc > resolved 1/26 EKG Cardimyopathy- resolving SHe had Aline and cuff discrepency -accept lower MAP  P:  Per renal Keep tele Repeat echo reviewed - yeah!!  RENAL ESRD on HD, T,Th,Sat Hyperkalemia > resolved Hyponatremia P:   HD, per renal  GASTROINTESTINAL Protein calorie malnutrition  P:   Diet as able  HEMATOLOGIC Anemia (dilutional, esrd) DVT prevention Thrombocytopenia > resolved  P:  DVT prophylaxis Aranesp  INFECTIOUS Pneumonia> presumably aspiration, improving  P:   Unasyn 1/25>>1/26 Vancomycin 1/26>>>1/28 Zosyn 1/26>>1/28>>1/29>>> Ceftriaxone 1/28>>1/29 Blood cx x2 1/26>>NGTD BC 1/29>>> sputum cultures 1/26: Moderate Enterobacter sensitive to cipro and pip/tazo, resistant to cefazolin Procal: 160.15-->104.98-->68.36 Cdiff 1/30 neg  Dc zosyn today  ENDOCRINE Hyperglycemia, 2/4 hypo borderline P:   SSI Lantus reduction, low cbg ssi diet  NEUROLOGIC Anoxic encephalopathy post cardiac arrest> resolved P:   Fentanyl prn Mobilize , walked in icu Pt, ot   FAMILY  - Updates:  Updated husband by bedside daily  - Inter-disciplinary family meet or Palliative Care meeting due by:  2/1  To tele, triad  Lavon Paganini. Titus Mould, MD, [place] Pgr: [facility]

## 2014-11-10 NOTE — Evaluation (Signed)
Physical Therapy Evaluation Patient Details Name: [name] MRN: [id number] DOB: [birth year] Today's Date: 11/10/2014   History of Present Illness  39 yo female with extensive PMH including HTN, ESRD on HD, previous TB, DM, kidney-pancreas transplant 2010, on home O2 with witnessed arrest at home. 1/26: admit s/p cardiac arrest, HD, hypothermia protocol, hypotensive during HD, 1/26: CVVHD started, 1/27: warmed, 2/1- extubation.   Clinical Impression  Patient demonstrates deficits in functional mobility as indicated below. Will need continued skilled PT to address deficits and maximize function. Will see as indicated and progress as tolerated. Recommend HHPT upon acute discharge to aid in balance recovery and increase activity tolerance.  OF NOTE: patient BP 12/66, 98% on 2 liters supplemental O2, desaturated to 92% on room air, ambulated with desaturations to 79% minimal distance, reapplied O2 at 2 liters, approximately 2 minutes to rebound. Spouse reports patient uses home O2 PRN at 5 liters (question accuracy? 5 liters vs .5 ?)    Follow Up Recommendations Home health PT;Supervision/Assistance - 24 hour (for balance and activity tolerance)    Equipment Recommendations  None recommended by PT    Recommendations for Other Services       Precautions / Restrictions Precautions Precautions: Fall Restrictions Weight Bearing Restrictions: No      Mobility  Bed Mobility Overal bed mobility: Modified Independent                Transfers Overall transfer level: Needs assistance Equipment used: 1 person hand held assist Transfers: Sit to/from Stand;Stand Pivot Transfers Sit to Stand: Supervision;Min guard Stand pivot transfers: Min guard       General transfer comment: Patient with some instability during elevation to upright  Ambulation/Gait Ambulation/Gait assistance: Min assist Ambulation Distance (Feet): 60 Feet Assistive device: 1 person hand held assist Gait  Pattern/deviations: Step-through pattern;Decreased stride length;Staggering right;Staggering left;Narrow base of support Gait velocity: decreased Gait velocity interpretation: Below normal speed for age/gender General Gait Details: patient unsteady with gait, reliance on HHA for support and stability during ambulation.   Stairs            Wheelchair Mobility    Modified Rankin (Stroke Patients Only)       Balance Overall balance assessment: Needs assistance         Standing balance support: During functional activity Standing balance-Leahy Scale: Poor Standing balance comment: patient with noted instability, increased lateral sway             High level balance activites: Direction changes;Turns High Level Balance Comments: min assist             Pertinent Vitals/Pain Pain Assessment: 0-10 Pain Score: 10-Worst pain ever Pain Location: chest Pain Descriptors / Indicators: Tightness;Pressure Pain Intervention(s): Limited activity within patient's tolerance;Monitored during session;Repositioned    Home Living Family/patient expects to be discharged to:: Private residence Living Arrangements: Spouse/significant other;Children Available Help at Discharge: Family Type of Home: House Home Access: Level entry     Home Layout: Two level;Able to live on main level with bedroom/bathroom Home Equipment: None Additional Comments: Patient with home O2 she wears occassionally at 5 liters    Prior Function Level of Independence: Independent               Hand Dominance   Dominant Hand: Right    Extremity/Trunk Assessment               Lower Extremity Assessment: Overall WFL for tasks assessed  Communication   Communication: Prefers language other than English (limited english)  Cognition Arousal/Alertness: Awake/alert Behavior During Therapy: Flat affect Overall Cognitive Status: Difficult to assess                       General Comments      Exercises        Assessment/Plan    PT Assessment Patient needs continued PT services  PT Diagnosis Difficulty walking;Acute pain   PT Problem List Decreased activity tolerance;Decreased balance;Decreased mobility;Decreased coordination;Pain  PT Treatment Interventions DME instruction;Gait training;Functional mobility training;Therapeutic activities;Therapeutic exercise;Balance training;Patient/family education   PT Goals (Current goals can be found in the Care Plan section) Acute Rehab PT Goals Patient Stated Goal: to go home PT Goal Formulation: With patient/family Time For Goal Achievement: 11/24/14 Potential to Achieve Goals: Good    Frequency Min 3X/week   Barriers to discharge        Co-evaluation               End of Session Equipment Utilized During Treatment: Gait belt Activity Tolerance: Patient limited by pain Patient left: in chair;with call bell/phone within reach;with family/visitor present Nurse Communication: Mobility status         Time: 4580-9983 PT Time Calculation (min) (ACUTE ONLY): 16 min   Charges:   PT Evaluation $Initial PT Evaluation Tier I: 1 Procedure     PT G CodesDuncan Dull 11-25-2014, 10:10 AM Alben Deeds, PT DPT  [phone]

## 2014-11-11 DIAGNOSIS — D649 Anemia, unspecified: Secondary | ICD-10-CM

## 2014-11-11 DIAGNOSIS — N186 End stage renal disease: Secondary | ICD-10-CM

## 2014-11-11 LAB — CBC
HEMATOCRIT: 20.5 % — AB (ref 36.0–46.0)
Hemoglobin: 6.9 g/dL — CL (ref 12.0–15.0)
MCH: 30.1 pg (ref 26.0–34.0)
MCHC: 33.7 g/dL (ref 30.0–36.0)
MCV: 89.5 fL (ref 78.0–100.0)
Platelets: 413 10*3/uL — ABNORMAL HIGH (ref 150–400)
RBC: 2.29 MIL/uL — ABNORMAL LOW (ref 3.87–5.11)
RDW: 18.1 % — ABNORMAL HIGH (ref 11.5–15.5)
WBC: 6.8 10*3/uL (ref 4.0–10.5)

## 2014-11-11 LAB — GLUCOSE, CAPILLARY
GLUCOSE-CAPILLARY: 106 mg/dL — AB (ref 70–99)
GLUCOSE-CAPILLARY: 188 mg/dL — AB (ref 70–99)
GLUCOSE-CAPILLARY: 185 mg/dL — AB (ref 70–99)
Glucose-Capillary: 99 mg/dL (ref 70–99)

## 2014-11-11 LAB — MRSA PCR SCREENING: MRSA by PCR: NEGATIVE

## 2014-11-11 LAB — CULTURE, BLOOD (ROUTINE X 2): Culture: NO GROWTH

## 2014-11-11 LAB — PREPARE RBC (CROSSMATCH)

## 2014-11-11 MED ORDER — LIDOCAINE-PRILOCAINE 2.5-2.5 % EX CREA
1.0000 "application " | TOPICAL_CREAM | CUTANEOUS | Status: DC | PRN
  Filled 2014-11-11: qty 5

## 2014-11-11 MED ORDER — SODIUM CHLORIDE 0.9 % IV SOLN: 100.0000 mL | INTRAVENOUS | Status: DC | PRN

## 2014-11-11 MED ORDER — ZOLPIDEM TARTRATE 5 MG PO TABS
5.0000 mg | ORAL_TABLET | Freq: Every evening | ORAL | Status: DC | PRN
  Administered 2014-11-11 – 2014-11-13 (×4): 5 mg via ORAL
  Filled 2014-11-11 (×4): qty 1

## 2014-11-11 MED ORDER — DARBEPOETIN ALFA 200 MCG/0.4ML IJ SOSY
200.0000 ug | PREFILLED_SYRINGE | Freq: Once | INTRAMUSCULAR | Status: AC
  Administered 2014-11-11: 200 ug via INTRAVENOUS

## 2014-11-11 MED ORDER — PENTAFLUOROPROP-TETRAFLUOROETH EX AERO: 1.0000 "application " | INHALATION_SPRAY | CUTANEOUS | Status: DC | PRN

## 2014-11-11 MED ORDER — OXYCODONE-ACETAMINOPHEN 5-325 MG PO TABS
1.0000 | ORAL_TABLET | Freq: Four times a day (QID) | ORAL | Status: DC | PRN
  Administered 2014-11-11 – 2014-11-15 (×10): 1 via ORAL
  Filled 2014-11-11 (×11): qty 1

## 2014-11-11 MED ORDER — ALTEPLASE 2 MG IJ SOLR
2.0000 mg | Freq: Once | INTRAMUSCULAR | Status: DC | PRN
  Filled 2014-11-11: qty 2

## 2014-11-11 MED ORDER — LIDOCAINE HCL (PF) 1 % IJ SOLN: 5.0000 mL | INTRAMUSCULAR | Status: DC | PRN

## 2014-11-11 MED ORDER — DARBEPOETIN ALFA 200 MCG/0.4ML IJ SOSY: 200.0000 ug | PREFILLED_SYRINGE | INTRAMUSCULAR | Status: DC

## 2014-11-11 MED ORDER — FENTANYL CITRATE 0.05 MG/ML IJ SOLN
INTRAMUSCULAR | Status: AC
  Administered 2014-11-11: 12.5 ug via INTRAVENOUS
  Filled 2014-11-11: qty 2

## 2014-11-11 MED ORDER — DARBEPOETIN ALFA 200 MCG/0.4ML IJ SOSY
PREFILLED_SYRINGE | INTRAMUSCULAR | Status: AC
  Administered 2014-11-11: 200 ug via INTRAVENOUS
  Filled 2014-11-11: qty 0.4

## 2014-11-11 MED ORDER — SODIUM CHLORIDE 0.9 % IV SOLN: Freq: Once | INTRAVENOUS | Status: DC

## 2014-11-11 MED ORDER — HEPARIN SODIUM (PORCINE) 1000 UNIT/ML DIALYSIS
1000.0000 [IU] | INTRAMUSCULAR | Status: DC | PRN
  Filled 2014-11-11: qty 1

## 2014-11-11 MED ORDER — ACETAMINOPHEN 325 MG PO TABS
ORAL_TABLET | ORAL | Status: AC
  Administered 2014-11-11: 650 mg via ORAL
  Filled 2014-11-11: qty 2

## 2014-11-11 MED ORDER — NEPRO/CARBSTEADY PO LIQD
237.0000 mL | ORAL | Status: DC | PRN
  Filled 2014-11-11: qty 237

## 2014-11-11 NOTE — Progress Notes (Signed)
Pt very restless, repeatedly bending access arm, wants benadryl to "go to sleep". MD aware. Pt. Already had pain meds, c/o of pain 10/10 in chest d/t cpr.

## 2014-11-11 NOTE — Progress Notes (Signed)
Patient has been calling out for pain every hour and also requesting sleep aid. Therefore notified on-call doctor. Orders given for Oxycodone/tyl PRN and ambien. Administered as ordered and will continue to monitor patient to end of shift.

## 2014-11-11 NOTE — Progress Notes (Signed)
[facility] Progress Note Patient Name: [name] DOB: Jan 20, 1976 MRN: [id number]   Date of Service  11/11/2014  HPI/Events of Note  C/o chest wall pain - likely related to prior CPR.  C/o trouble sleeping.   eICU Interventions  Add prn percocet.  Add prn ambien qhs.      Intervention Category Major Interventions: Other:  Desirey Keahey 11/11/2014, 12:11 AM

## 2014-11-11 NOTE — Progress Notes (Signed)
PT Cancellation Note  Patient Details Name: [name] MRN: [id number] DOB: 1975-12-29   Cancelled Treatment:     Patient at test/procedure at this time (at HD)   Duncan Dull 11/11/2014, 10:40 AM Alben Deeds, PT DPT  [phone]

## 2014-11-11 NOTE — Progress Notes (Signed)
WRONG OF DATA

## 2014-11-11 NOTE — Progress Notes (Signed)
PULMONARY / CRITICAL CARE MEDICINE   Name: [name] MRN: [id number] DOB: [birth year]    ADMISSION DATE:  10/31/2014  REFERRING MD :  EDP  CHIEF COMPLAINT:  Cardiac arrest   INITIAL PRESENTATION:  39 yo female with extensive PMH including HTN, ESRD on HD, previous TB, DM, kidney-pancreas transplant 2010, on home O2 with witnessed arrest at home.  Had her HD as regularly scheduled on 1/22.  Husband performed CPR.  Wide complex rhythm on EMS arrival.  Total ~1 hour CPR during multiple episodes, 7 epi, 6 defib.  K+ in ER was 8.1.  PCCM called to admit.   STUDIES:  CT head 1/25: ?acute sinusitis pCXR 1/25: cardiomegaly, increasing interstitial and airspace disease EEG 1/25: Anoxic encephalopathy Echo 1/26: severely reduced EF 25-30%, moderate LVH, grade 2 DD, PA pressure 62mmHg, trivial pericardial effusion EEG 1/29: diffuse cerebral dysfunction, non-specific etiology, no seizures Echo repeat 2/2>>>normlaized EF 50%  SIGNIFICANT EVENTS: 1/26: admit s/p cardiac arrest, HD, hypothermia protocol, hypotensive during HD 1/26: CVVHD started 1/27: warmed 1/28: Following some commands 1/29: Fevers, zosyn restarted 1/30: afebrile, +BM 1/31: Pressure support ventilation for > 8 hours, 2L volume removed 2/01- extubation 2/03- tolerated HD well, no pressors initiated 2/04 Transferred to telemetry 2/05 @ units PRBCs transfused during HD for Hgb 6.9  SUBJECTIVE:  Somnolent during HD. No distress  VITAL SIGNS: Temp:  [97.5 F (36.4 C)-98.5 F (36.9 C)] 97.8 F (36.6 C) (02/05 1058) Pulse Rate:  [83-103] 96 (02/05 1058) Resp:  [18-30] 21 (02/05 1058) BP: (118-152)/(53-72) 146/72 mmHg (02/05 1058) SpO2:  [84 %-100 %] 100 % (02/05 1058) Weight:  [76.2 kg (167 lb 15.9 oz)-76.7 kg (169 lb 1.5 oz)] 76.7 kg (169 lb 1.5 oz) (02/05 0855) HEMODYNAMICS:   VENTILATOR SETTINGS:   INTAKE / OUTPUT:  Intake/Output Summary (Last 24 hours) at 11/11/14 1108 Last data filed at 11/11/14 1058  Gross per  24 hour  Intake   1295 ml  Output      0 ml  Net   1295 ml   PHYSICAL EXAMINATION: General:  NAD HEENT: WNL PULM: Clear CV: Reg, no M AB: BS+, soft, nontender Ext: warm, minimal LE edema Neuro: No focal deficits noted  LABS:  CBC  Recent Labs Lab 11/08/14 1053 11/09/14 1500 11/11/14 0917  WBC 11.5* 9.8 6.8  HGB 8.8* 7.3* 6.9*  HCT 27.8* 21.6* 20.5*  PLT 463* 427* 413*   Coag's  Recent Labs Lab 11/05/14 0430 11/06/14 0358 11/07/14 0418  APTT 37 36 36   BMET  Recent Labs Lab 11/07/14 0418 11/08/14 1053 11/09/14 1500  NA 134* 132* 126*  K 3.7 4.3 3.8  CL 100 97 91*  CO2 26 22 18*  BUN 41* 75* 101*  CREATININE 2.48* 5.61* 7.88*  GLUCOSE 217* 279* 164*   Electrolytes  Recent Labs Lab 11/05/14 0430  11/06/14 0358 11/06/14 1600 11/07/14 0418 11/08/14 1053 11/09/14 1500  CALCIUM 8.7  < > 8.6 8.5 9.2 9.4 8.6  MG 2.8*  --  2.7*  --  3.0*  --   --   PHOS  --   < >  --  3.2  --  6.2* 9.4*  < > = values in this interval not displayed. ABG  Recent Labs Lab 11/05/14 0455 11/06/14 1039 11/07/14 0332  PHART 7.352 7.360 7.395  PCO2ART 46.1* 44.5 39.6  PO2ART 103.0* 118.0* 153.0*   Cardiac Enzymes  Recent Labs Lab 11/08/14 1053  TROPONINI 0.34*   Glucose  Recent Labs Lab  11/10/14 0417 11/10/14 0746 11/10/14 1224 11/10/14 1726 11/10/14 2129 11/11/14 0703  GLUCAP 92 85 146* 136* 92 99   No new CXR  ASSESSMENT / PLAN:  PULMONARY OETT 1/25>>> 2/1 Acute respiratory failure, resolved ?aspiration PNA  Hx TB  Pulm edema> improving P:   Cont supp O2 as needed to maintain SpO2 > 92%  CARDIOVASCULAR CVL R fem 1/25>>>1/26 R fem aline 1/25>>>1/28 IJ  cvl left 1/26>>>self 2/1 Left fem hd 1/26>>> out  Cardiac arrest due to hyperK+  Hx HTN  Prolonged qtc > resolved 1/26 EKG Cardimyopathy- resolving She had Aline and cuff discrepency -accept lower MAP P:  No further cardiac W/U planned Cont to monitor  RENAL ESRD on HD,  T,Th,Sat Hyperkalemia > resolved Hyponatremia P:   Monitor BMET intermittently Monitor I/Os Correct electrolytes as indicated Renal Service following and managing HD  GASTROINTESTINAL Protein calorie malnutrition P:   Advance diet as able  HEMATOLOGIC Anemia (dilutional, esrd) Thrombocytopenia > resolved P:  DVT px: SQ heparin Monitor CBC intermittently Transfuse per usual ICU guidelines Cont Aranesp Hemoccult cards ordered  INFECTIOUS Pneumonia> presumably aspiration, improving P:       Unasyn 1/25>>1/26 Vancomycin 1/26>>>1/28 Zosyn 1/26 >> 2/03 Ceftriaxone 1/28 >> 1/29   Blood cx x2 1/26>>NE BC 1/29>>> NEG Resp 1/26: Moderate Enterobacter sensitive to cipro and pip/tazo, resistant to cefazolin Cdiff 1/30 neg  ENDOCRINE Type 2 DM, controlled Risk of hypoglycemia due to CKD and poor PO intake P:   Cont Lantus at reduced dose Cont SSI  NEUROLOGIC Anoxic encephalopathy post cardiac arrest> resolved Deconditioning P:   Cont PT/OT  TRH to assume care as of AM 2/06 and PCCM to sign off. Discussed with Dr Nicolette Bang, MD ; [facility] [place] [phone].  After 5:30 PM or weekends, call [phone]

## 2014-11-11 NOTE — Progress Notes (Signed)
Lab reported hgb of 6.9 Pt. STAT type and screen and 2 units ordered per Dr. Florene Glen

## 2014-11-11 NOTE — Progress Notes (Signed)
Pt received 2 uprbc during HD w/ no complications. 12.13mcg Fentanyl given at 1200p. Pt resting, no c/o, vss

## 2014-11-11 NOTE — Progress Notes (Signed)
Pt system clotted. Restarted new tx. NP added 15 mins and .5 L to pt goal due to vss and extra fluid on pt. HD tx completed w/ no complications. Pt. Alert, vss, report called to [facility] Primary RN.  3.5 L removed from pt.

## 2014-11-11 NOTE — Procedures (Signed)
   [name] SOB Weight 76.7Kg, up from baseline 72.5kg, low BP. WIll try for more fluid off as tolerated, Hemoglobin 7.3 2 days ago and will recheck and transfuse if needed  [name]    HD: TTS [place] 4h 72.5kg 2/2.25 Bath 400/A1.5 LUA AVG Heparin 2400 Aranesp 200 / wk, Hect 5 ug, Venofer 100/ hd thru 1/28

## 2014-11-12 DIAGNOSIS — I10 Essential (primary) hypertension: Secondary | ICD-10-CM

## 2014-11-12 DIAGNOSIS — Z992 Dependence on renal dialysis: Secondary | ICD-10-CM

## 2014-11-12 LAB — CBC
HEMATOCRIT: 28.9 % — AB (ref 36.0–46.0)
Hemoglobin: 9.6 g/dL — ABNORMAL LOW (ref 12.0–15.0)
MCH: 29.2 pg (ref 26.0–34.0)
MCHC: 33.2 g/dL (ref 30.0–36.0)
MCV: 87.8 fL (ref 78.0–100.0)
Platelets: 389 10*3/uL (ref 150–400)
RBC: 3.29 MIL/uL — ABNORMAL LOW (ref 3.87–5.11)
RDW: 17.9 % — AB (ref 11.5–15.5)
WBC: 7.6 10*3/uL (ref 4.0–10.5)

## 2014-11-12 LAB — BASIC METABOLIC PANEL
Anion gap: 11 (ref 5–15)
BUN: 25 mg/dL — ABNORMAL HIGH (ref 6–23)
CO2: 21 mmol/L (ref 19–32)
Calcium: 8 mg/dL — ABNORMAL LOW (ref 8.4–10.5)
Chloride: 96 mmol/L (ref 96–112)
Creatinine, Ser: 3.95 mg/dL — ABNORMAL HIGH (ref 0.50–1.10)
GFR calc Af Amer: 16 mL/min — ABNORMAL LOW (ref >90)
GFR, EST NON AFRICAN AMERICAN: 13 mL/min — AB (ref >90)
Glucose, Bld: 163 mg/dL — ABNORMAL HIGH (ref 70–99)
Potassium: 5.6 mmol/L — ABNORMAL HIGH (ref 3.5–5.1)
Sodium: 128 mmol/L — ABNORMAL LOW (ref 135–145)

## 2014-11-12 LAB — TYPE AND SCREEN
ABO/RH(D): AB POS
Antibody Screen: NEGATIVE
Unit division: 0
Unit division: 0

## 2014-11-12 LAB — GLUCOSE, CAPILLARY
GLUCOSE-CAPILLARY: 140 mg/dL — AB (ref 70–99)
GLUCOSE-CAPILLARY: 168 mg/dL — AB (ref 70–99)
Glucose-Capillary: 130 mg/dL — ABNORMAL HIGH (ref 70–99)
Glucose-Capillary: 218 mg/dL — ABNORMAL HIGH (ref 70–99)

## 2014-11-12 MED ORDER — FENTANYL CITRATE 0.05 MG/ML IJ SOLN
12.5000 ug | INTRAMUSCULAR | Status: DC | PRN
  Administered 2014-11-12 – 2014-11-15 (×9): 25 ug via INTRAVENOUS
  Filled 2014-11-12 (×9): qty 2

## 2014-11-12 MED ORDER — DOXERCALCIFEROL 4 MCG/2ML IV SOLN
INTRAVENOUS | Status: AC
  Administered 2014-11-12: 3 ug
  Filled 2014-11-12: qty 2

## 2014-11-12 MED ORDER — DARBEPOETIN ALFA 200 MCG/0.4ML IJ SOSY
PREFILLED_SYRINGE | INTRAMUSCULAR | Status: AC
  Administered 2014-11-12: 200 ug
  Filled 2014-11-12: qty 0.4

## 2014-11-12 MED ORDER — DOXERCALCIFEROL 4 MCG/2ML IV SOLN
3.0000 ug | INTRAVENOUS | Status: DC
Start: 2014-11-15 — End: 2014-11-16
  Administered 2014-11-15: 3 ug via INTRAVENOUS
  Filled 2014-11-12: qty 2

## 2014-11-12 NOTE — Progress Notes (Signed)
[facility] PROGRESS NOTE  [name] FKC:[id number] DOB: 16-Sep-1976 DOA: 10/31/2014 PCP: Tommy Medal, MD  Assessment/Plan: 1. Cardiac arrest 1. Was seen by Cardiology during this admission 2. Likely secondary to hyperkalemia as noted on presentation 3. Pt did require prolonged CPR, ultimately requiring hypothermic protocol and later successfully extubated 2. ESRD 1. Nephrology is following 2. Undergoing HD as scheduled 3. Hyperkalemia 1. Improved with HD 4. Anemia 1. Likely related to ESRD 2. Pt did receive 2 units of PRBC's on 2/5 HD with hgb improved from 6.9 to 9.6 3. Continue to follow cbc 5. Pulm edema 1. Improved with HD 2. Currently on min O2 support 6. HTN 1. BP stable, albeit suboptimally controlled 2. Pt noted to be hypotensive during HD early during this course 3. Therefore, will observe now to allow for HD, but if bp continues to be elevated, would slowly resume home bp meds 7. Chest pain 1. Likely musculoskeletal secondary to chest compressions 2. On analgesics as tolerated 8. DVT prophylaxis 1. Heparin subq  Code Status: Full Family Communication: Pt in room (indicate person spoken with, relationship, and if by phone, the number) Disposition Plan: Pending   Consultants:  Cardiology  Critical care  Nephrology  Procedures: CT head 1/25: ?acute sinusitis pCXR 1/25: cardiomegaly, increasing interstitial and airspace disease EEG 1/25: Anoxic encephalopathy Echo 1/26: severely reduced EF 25-30%, moderate LVH, grade 2 DD, PA pressure 4mmHg, trivial pericardial effusion EEG 1/29: diffuse cerebral dysfunction, non-specific etiology, no seizures Echo repeat 2/2>>>normlaized EF 50%  Antibiotics: Unasyn 1/25>>1/26 Vancomycin 1/26>>>1/28 Zosyn 1/26 >> 2/03 Ceftriaxone 1/28 >> 1/29  HPI/Subjective: No complaints. Pt without acute events noted overnight  Objective: Filed Vitals:   11/12/14 1430 11/12/14 1500 11/12/14 1530 11/12/14 1600  BP:  113/70 156/74 135/80 147/70  Pulse: 88 90 87 88  Temp:    98.5 F (36.9 C)  TempSrc:    Oral  Resp: 29 28 29 23   Height:      Weight:    77 kg (169 lb 12.1 oz)  SpO2:    100%    Intake/Output Summary (Last 24 hours) at 11/12/14 1852 Last data filed at 11/12/14 1630  Gross per 24 hour  Intake   1770 ml  Output   2372 ml  Net   -602 ml   Filed Weights   11/12/14 0531 11/12/14 1230 11/12/14 1600  Weight: 76.3 kg (168 lb 3.4 oz) 77.2 kg (170 lb 3.1 oz) 77 kg (169 lb 12.1 oz)    Exam:   General:  Asleep on HD, arousable, in nad  Cardiovascular: regular, s1, s2  Respiratory: normal resp effort, no wheezing  Abdomen: soft,nondistended  Musculoskeletal: perfused, no clubbing   Data Reviewed: Basic Metabolic Panel:  Recent Labs Lab 11/06/14 0358 11/06/14 1600 11/07/14 0418 11/08/14 1053 11/09/14 1500 11/12/14 0355  NA 134* 136 134* 132* 126* 128*  K 3.8 3.8 3.7 4.3 3.8 5.6*  CL 99 96 100 97 91* 96  CO2 26 22 26 22  18* 21  GLUCOSE 174* 210* 217* 279* 164* 163*  BUN 46* 36* 41* 75* 101* 25*  CREATININE 2.74* 2.50* 2.48* 5.61* 7.88* 3.95*  CALCIUM 8.6 8.5 9.2 9.4 8.6 8.0*  MG 2.7*  --  3.0*  --   --   --   PHOS  --  3.2  --  6.2* 9.4*  --    Liver Function Tests:  Recent Labs Lab 11/06/14 0358 11/06/14 1600 11/08/14 1053 11/09/14 1500  AST 17  --   --   --  ALT 20  --   --   --   ALKPHOS 69  --   --   --   BILITOT 0.6  --   --   --   PROT 7.9  --   --   --   ALBUMIN 2.6* 2.8* 3.1* 2.5*   No results for input(s): LIPASE, AMYLASE in the last 168 hours. No results for input(s): AMMONIA in the last 168 hours. CBC:  Recent Labs Lab 11/06/14 0812 11/07/14 0418 11/08/14 1053 11/09/14 1500 11/11/14 0917 11/12/14 0746  WBC 10.4 10.2 11.5* 9.8 6.8 7.6  NEUTROABS 8.6*  --   --   --   --   --   HGB 7.4* 8.3* 8.8* 7.3* 6.9* 9.6*  HCT 23.8* 26.3* 27.8* 21.6* 20.5* 28.9*  MCV 93.0 92.9 94.6 89.3 89.5 87.8  PLT 204 348 463* 427* 413* 389   Cardiac  Enzymes:  Recent Labs Lab 11/08/14 1053  TROPONINI 0.34*   BNP (last 3 results) No results for input(s): BNP in the last 8760 hours.  ProBNP (last 3 results)  Recent Labs  04/18/14 1938  PROBNP >70000.0*    CBG:  Recent Labs Lab 11/11/14 1619 11/11/14 2159 11/12/14 0733 11/12/14 1122 11/12/14 1619  GLUCAP 188* 185* 140* 168* 130*    Recent Results (from the past 240 hour(s))  Culture, blood (routine x 2)     Status: None   Collection Time: 11/04/14  3:12 PM  Result Value Ref Range Status   Specimen Description BLOOD RIGHT HAND  Final   Special Requests BOTTLES DRAWN AEROBIC AND ANAEROBIC 4CC  Final   Culture   Final    NO GROWTH 5 DAYS Performed at [facility]    Report Status 11/10/2014 FINAL  Final  Culture, blood (routine x 2)     Status: None   Collection Time: 11/04/14  6:35 PM  Result Value Ref Range Status   Specimen Description BLOOD RIGHT ARM  Final   Special Requests BOTTLES DRAWN AEROBIC AND ANAEROBIC 10CC  Final   Culture   Final    NO GROWTH 5 DAYS Note: Culture results may be compromised due to an excessive volume of blood received in culture bottles. Performed at [facility]    Report Status 11/11/2014 FINAL  Final  Clostridium Difficile by PCR     Status: None   Collection Time: 11/05/14  3:31 PM  Result Value Ref Range Status   C difficile by pcr NEGATIVE NEGATIVE Final  MRSA PCR Screening     Status: None   Collection Time: 11/11/14  8:39 AM  Result Value Ref Range Status   MRSA by PCR NEGATIVE NEGATIVE Final    Comment:        The GeneXpert MRSA Assay (FDA approved for NASAL specimens only), is one component of a comprehensive MRSA colonization surveillance program. It is not intended to diagnose MRSA infection nor to guide or monitor treatment for MRSA infections.      Studies: No results found.  Scheduled Meds: . sodium chloride   Intravenous Once  . antiseptic oral rinse  7 mL Mouth Rinse BID  .  aspirin  81 mg Oral Daily  . [START ON 11/19/2014] darbepoetin (ARANESP) injection - DIALYSIS  200 mcg Intravenous Q Sat-HD  . [START ON 11/15/2014] doxercalciferol  3 mcg Intravenous Q T,Th,Sa-HD  . ferric gluconate (FERRLECIT/NULECIT) IV  62.5 mg Intravenous Q Thu-HD  . heparin subcutaneous  5,000 Units Subcutaneous 3 times per day  Continuous Infusions:   Active Problems:   Cardiac arrest   Respiratory failure   Anoxic encephalopathy   Cardiogenic shock   Acute hyperkalemia   Acute respiratory failure   Galit Urich K  [facility] Pager [phone]. If 7PM-7AM, please contact night-coverage at [email or web address], password [facility] 11/12/2014, 6:52 PM  LOS: 12 days

## 2014-11-12 NOTE — Progress Notes (Signed)
Assessment: 1. Cardiac arrest /severe hyperkalemia - s/p cooling protocol, high K resolved 2. MHWK(08.8PJ) on HD TTS, s/p CRRT. HD today for volume and schedule 3. Pulm edema / vol excess - - 2.38 liter off yesterday 4. Anemia, s/p PRBCs yesterday. cont max esa, weekly IV Fe  5. VDRF S/P extubation 6. Hx failed KP transplant  Subjective: Interval History: BP back up Objective: Vital signs in last 24 hours: Temp:  [97.6 F (36.4 C)-98.3 F (36.8 C)] 97.6 F (36.4 C) (02/06 0531) Pulse Rate:  [63-96] 81 (02/06 0531) Resp:  [18-30] 18 (02/06 0531) BP: (106-159)/(54-119) 159/61 mmHg (02/06 0531) SpO2:  [97 %-100 %] 100 % (02/06 0531) Weight:  [74.6 kg (164 lb 7.4 oz)-76.3 kg (168 lb 3.4 oz)] 76.3 kg (168 lb 3.4 oz) (02/06 0531) Weight change: 0.5 kg (1 lb 1.6 oz)  Intake/Output from previous day: 02/05 0701 - 02/06 0700 In: 2520 [P.O.:1850; Blood:670] Out: 2380  Intake/Output this shift: Total I/O In: 360 [P.O.:360] Out: -   General appearance: alert and cooperative Resp: clear to auscultation bilaterally Cardio: regular rate and rhythm, S1, S2 normal, no murmur, click, rub or gallop Extremities: edema tr  Lab Results:  Recent Labs  11/11/14 0917 11/12/14 0746  WBC 6.8 7.6  HGB 6.9* 9.6*  HCT 20.5* 28.9*  PLT 413* 389   BMET:  Recent Labs  11/09/14 1500 11/12/14 0355  NA 126* 128*  K 3.8 5.6*  CL 91* 96  CO2 18* 21  GLUCOSE 164* 163*  BUN 101* 25*  CREATININE 7.88* 3.95*  CALCIUM 8.6 8.0*   No results for input(s): PTH in the last 72 hours. Iron Studies: No results for input(s): IRON, TIBC, TRANSFERRIN, FERRITIN in the last 72 hours. Studies/Results: No results found.  Scheduled: . sodium chloride   Intravenous Once  . antiseptic oral rinse  7 mL Mouth Rinse BID  . aspirin  81 mg Oral Daily  . [START ON 11/19/2014] darbepoetin (ARANESP) injection - DIALYSIS  200 mcg Intravenous Q Sat-HD  . doxercalciferol  3 mcg Oral Q T,Th,Sa-HD  . ferric  gluconate (FERRLECIT/NULECIT) IV  62.5 mg Intravenous Q Thu-HD  . heparin subcutaneous  5,000 Units Subcutaneous 3 times per day      LOS: 12 days   [name] C 11/12/2014,10:47 AM

## 2014-11-13 DIAGNOSIS — E0822 Diabetes mellitus due to underlying condition with diabetic chronic kidney disease: Secondary | ICD-10-CM

## 2014-11-13 LAB — GLUCOSE, CAPILLARY
GLUCOSE-CAPILLARY: 152 mg/dL — AB (ref 70–99)
Glucose-Capillary: 131 mg/dL — ABNORMAL HIGH (ref 70–99)
Glucose-Capillary: 141 mg/dL — ABNORMAL HIGH (ref 70–99)
Glucose-Capillary: 171 mg/dL — ABNORMAL HIGH (ref 70–99)

## 2014-11-13 MED ORDER — AMLODIPINE BESYLATE 10 MG PO TABS
10.0000 mg | ORAL_TABLET | Freq: Every day | ORAL | Status: DC
  Administered 2014-11-13 – 2014-11-15 (×3): 10 mg via ORAL
  Filled 2014-11-13 (×4): qty 1

## 2014-11-13 MED ORDER — AMLODIPINE BESYLATE 10 MG PO TABS
10.0000 mg | ORAL_TABLET | Freq: Every day | ORAL | Status: DC
Start: 2014-11-13 — End: 2014-11-13

## 2014-11-13 MED ORDER — SIMVASTATIN 20 MG PO TABS
20.0000 mg | ORAL_TABLET | ORAL | Status: DC
  Administered 2014-11-14 – 2014-11-16 (×2): 20 mg via ORAL
  Filled 2014-11-13 (×2): qty 1

## 2014-11-13 NOTE — Progress Notes (Signed)
Assessment: 1. Cardiac arrest /severe hyperkalemia - s/p cooling protocol, high K resolved 2. OZYY(48.2NO) on HD TTS, s/p CRRT.  3. Pulm edema / vol excess - - 2.4 liter off yesterday, plan for AM to continue to optimize volume and BP(off schedule) 4. Anemia, s/p PRBCs yesterday. cont max esa, weekly IV Fe  5. VDRF S/P extubation 6. Hx failed KP transplant 7. Hypertension.  Will need to restart meds before discharge   Subjective: Interval History: HD yesterday.  Objective: Vital signs in last 24 hours: Temp:  [98 F (36.7 C)-98.5 F (36.9 C)] 98 F (36.7 C) (02/07 0547) Pulse Rate:  [80-93] 80 (02/07 0547) Resp:  [18-29] 18 (02/07 0547) BP: (113-156)/(55-80) 133/55 mmHg (02/07 0547) SpO2:  [100 %] 100 % (02/07 0547) Weight:  [77 kg (169 lb 12.1 oz)-77.2 kg (170 lb 3.1 oz)] 77 kg (169 lb 12.1 oz) (02/06 1600) Weight change: 0.5 kg (1 lb 1.6 oz)  Intake/Output from previous day: 02/06 0701 - 02/07 0700 In: 1320 [P.O.:1320] Out: 2372  Intake/Output this shift: Total I/O In: 360 [P.O.:360] Out: -   General appearance: alert and cooperative Chest wall: sternal Cardio: regular rate and rhythm, S1, S2 normal, no murmur, click, rub or gallop Extremities: extremities normal, atraumatic, no cyanosis or edema  Lab Results:  Recent Labs  11/11/14 0917 11/12/14 0746  WBC 6.8 7.6  HGB 6.9* 9.6*  HCT 20.5* 28.9*  PLT 413* 389   BMET:  Recent Labs  11/12/14 0355  NA 128*  K 5.6*  CL 96  CO2 21  GLUCOSE 163*  BUN 25*  CREATININE 3.95*  CALCIUM 8.0*   No results for input(s): PTH in the last 72 hours. Iron Studies: No results for input(s): IRON, TIBC, TRANSFERRIN, FERRITIN in the last 72 hours. Studies/Results: No results found.  Scheduled: . sodium chloride   Intravenous Once  . antiseptic oral rinse  7 mL Mouth Rinse BID  . aspirin  81 mg Oral Daily  . [START ON 11/19/2014] darbepoetin (ARANESP) injection - DIALYSIS  200 mcg Intravenous Q Sat-HD  . [START  ON 11/15/2014] doxercalciferol  3 mcg Intravenous Q T,Th,Sa-HD  . ferric gluconate (FERRLECIT/NULECIT) IV  62.5 mg Intravenous Q Thu-HD  . heparin subcutaneous  5,000 Units Subcutaneous 3 times per day     LOS: 13 days   Briseidy Spark C 11/13/2014,10:36 AM

## 2014-11-13 NOTE — Progress Notes (Signed)
[facility] PROGRESS NOTE  [name]:[id number] DOB: 1975/10/29 DOA: 10/31/2014 PCP: Tommy Medal, MD  Assessment/Plan: 1. Cardiac arrest 1. Was seen by Cardiology during this admission 2. Likely secondary to hyperkalemia as noted on initial presentation 3. Pt did require prolonged CPR, ultimately requiring hypothermic protocol and later stabilization and successful extubation 4. Pt remains stable at present 2. ESRD 1. Nephrology is following 2. Undergoing HD normally TTS, currently off-schedule with session planned for 2/8 to optimize volume status 3. Hyperkalemia 1. Improved with HD 2. Still mildly hyperkalemic 4. Anemia 1. Likely related to ESRD 2. Pt did receive 2 units of PRBC's on 2/5 HD with hgb improved from 6.9 to 9.6 3. Continue to follow cbc and transfuse as needed 5. Pulm edema 1. Improved with HD 2. Currently on min O2 support 6. HTN 1. BP stable, albeit suboptimally controlled 2. Pt noted to be hypotensive during HD early during this course 3. Therefore, will observe now to allow for HD, but if bp continues to be elevated, would slowly resume home bp meds 7. Chest pain 1. Likely musculoskeletal secondary to chest compressions 2. On analgesics as tolerated 8. DVT prophylaxis 1. Heparin subq  Code Status: Full Family Communication: Pt in room Disposition Plan: Pending   Consultants:  Cardiology  Critical care  Nephrology  Procedures: CT head 1/25: ?acute sinusitis pCXR 1/25: cardiomegaly, increasing interstitial and airspace disease EEG 1/25: Anoxic encephalopathy Echo 1/26: severely reduced EF 25-30%, moderate LVH, grade 2 DD, PA pressure 56mmHg, trivial pericardial effusion EEG 1/29: diffuse cerebral dysfunction, non-specific etiology, no seizures Echo repeat 2/2>>>normlaized EF 50%  Antibiotics: Unasyn 1/25>>1/26 Vancomycin 1/26>>>1/28 Zosyn 1/26 >> 2/03 Ceftriaxone 1/28 >> 1/29  HPI/Subjective: Complains of continued chest pains  s/p chest compressions. Otherwise, no other complaints.  Objective: Filed Vitals:   11/12/14 1600 11/12/14 2001 11/13/14 0547 11/13/14 1438  BP: 147/70 140/57 133/55 154/65  Pulse: 88 90 80 84  Temp: 98.5 F (36.9 C) 98.2 F (36.8 C) 98 F (36.7 C) 97.7 F (36.5 C)  TempSrc: Oral Oral Oral Oral  Resp: 23 20 18 20   Height:      Weight: 77 kg (169 lb 12.1 oz)     SpO2: 100% 100% 100% 100%    Intake/Output Summary (Last 24 hours) at 11/13/14 1539 Last data filed at 11/13/14 1437  Gross per 24 hour  Intake   1560 ml  Output   2372 ml  Net   -812 ml   Filed Weights   11/12/14 0531 11/12/14 1230 11/12/14 1600  Weight: 76.3 kg (168 lb 3.4 oz) 77.2 kg (170 lb 3.1 oz) 77 kg (169 lb 12.1 oz)    Exam:   General:  Awake, sitting in chair, in nad  Cardiovascular: regular, s1, s2  Respiratory: normal resp effort, no wheezing  Abdomen: soft,nondistended  Musculoskeletal: perfused, no clubbing   Data Reviewed: Basic Metabolic Panel:  Recent Labs Lab 11/06/14 1600 11/07/14 0418 11/08/14 1053 11/09/14 1500 11/12/14 0355  NA 136 134* 132* 126* 128*  K 3.8 3.7 4.3 3.8 5.6*  CL 96 100 97 91* 96  CO2 22 26 22  18* 21  GLUCOSE 210* 217* 279* 164* 163*  BUN 36* 41* 75* 101* 25*  CREATININE 2.50* 2.48* 5.61* 7.88* 3.95*  CALCIUM 8.5 9.2 9.4 8.6 8.0*  MG  --  3.0*  --   --   --   PHOS 3.2  --  6.2* 9.4*  --    Liver Function Tests:  Recent Labs  Lab 11/06/14 1600 11/08/14 1053 11/09/14 1500  ALBUMIN 2.8* 3.1* 2.5*   No results for input(s): LIPASE, AMYLASE in the last 168 hours. No results for input(s): AMMONIA in the last 168 hours. CBC:  Recent Labs Lab 11/07/14 0418 11/08/14 1053 11/09/14 1500 11/11/14 0917 11/12/14 0746  WBC 10.2 11.5* 9.8 6.8 7.6  HGB 8.3* 8.8* 7.3* 6.9* 9.6*  HCT 26.3* 27.8* 21.6* 20.5* 28.9*  MCV 92.9 94.6 89.3 89.5 87.8  PLT 348 463* 427* 413* 389   Cardiac Enzymes:  Recent Labs Lab 11/08/14 1053  TROPONINI 0.34*   BNP  (last 3 results) No results for input(s): BNP in the last 8760 hours.  ProBNP (last 3 results)  Recent Labs  04/18/14 1938  PROBNP >70000.0*    CBG:  Recent Labs Lab 11/12/14 0733 11/12/14 1122 11/12/14 1619 11/12/14 2116 11/13/14 1107  GLUCAP 140* 168* 130* 218* 152*    Recent Results (from the past 240 hour(s))  Culture, blood (routine x 2)     Status: None   Collection Time: 11/04/14  3:12 PM  Result Value Ref Range Status   Specimen Description BLOOD RIGHT HAND  Final   Special Requests BOTTLES DRAWN AEROBIC AND ANAEROBIC 4CC  Final   Culture   Final    NO GROWTH 5 DAYS Performed at [facility]    Report Status 11/10/2014 FINAL  Final  Culture, blood (routine x 2)     Status: None   Collection Time: 11/04/14  6:35 PM  Result Value Ref Range Status   Specimen Description BLOOD RIGHT ARM  Final   Special Requests BOTTLES DRAWN AEROBIC AND ANAEROBIC 10CC  Final   Culture   Final    NO GROWTH 5 DAYS Note: Culture results may be compromised due to an excessive volume of blood received in culture bottles. Performed at [facility]    Report Status 11/11/2014 FINAL  Final  Clostridium Difficile by PCR     Status: None   Collection Time: 11/05/14  3:31 PM  Result Value Ref Range Status   C difficile by pcr NEGATIVE NEGATIVE Final  MRSA PCR Screening     Status: None   Collection Time: 11/11/14  8:39 AM  Result Value Ref Range Status   MRSA by PCR NEGATIVE NEGATIVE Final    Comment:        The GeneXpert MRSA Assay (FDA approved for NASAL specimens only), is one component of a comprehensive MRSA colonization surveillance program. It is not intended to diagnose MRSA infection nor to guide or monitor treatment for MRSA infections.      Studies: No results found.  Scheduled Meds: . sodium chloride   Intravenous Once  . amLODipine  10 mg Oral QHS  . antiseptic oral rinse  7 mL Mouth Rinse BID  . aspirin  81 mg Oral Daily  . [START ON  11/19/2014] darbepoetin (ARANESP) injection - DIALYSIS  200 mcg Intravenous Q Sat-HD  . [START ON 11/15/2014] doxercalciferol  3 mcg Intravenous Q T,Th,Sa-HD  . ferric gluconate (FERRLECIT/NULECIT) IV  62.5 mg Intravenous Q Thu-HD  . heparin subcutaneous  5,000 Units Subcutaneous 3 times per day  . [START ON 11/14/2014] simvastatin  20 mg Oral Q M,W,F   Continuous Infusions:   Active Problems:   Essential hypertension   DM (diabetes mellitus)   ESRD on hemodialysis   Cardiac arrest   Respiratory failure   Anoxic encephalopathy   Cardiogenic shock   Acute hyperkalemia   Acute respiratory  failure   CHIU, [place]  [facility] Pager [phone]. If 7PM-7AM, please contact night-coverage at [email or web address], password [facility] 11/13/2014, 3:39 PM  LOS: 13 days

## 2014-11-14 LAB — GLUCOSE, CAPILLARY: Glucose-Capillary: 128 mg/dL — ABNORMAL HIGH (ref 70–99)

## 2014-11-14 MED ORDER — FENTANYL CITRATE 0.05 MG/ML IJ SOLN
INTRAMUSCULAR | Status: AC
  Filled 2014-11-14: qty 2

## 2014-11-14 MED ORDER — CALCIUM ACETATE 667 MG PO CAPS
2001.0000 mg | ORAL_CAPSULE | Freq: Three times a day (TID) | ORAL | Status: DC
  Administered 2014-11-14 – 2014-11-16 (×6): 2001 mg via ORAL
  Filled 2014-11-14 (×9): qty 3

## 2014-11-14 MED ORDER — CINACALCET HCL 30 MG PO TABS
30.0000 mg | ORAL_TABLET | Freq: Every day | ORAL | Status: DC
  Administered 2014-11-14 – 2014-11-16 (×3): 30 mg via ORAL
  Filled 2014-11-14 (×3): qty 1

## 2014-11-14 NOTE — Progress Notes (Signed)
  [facility] Progress Note   Subjective: Up in chair, watching a movie on her phone, no complaints  Filed Vitals:   11/13/14 0547 11/13/14 1438 11/13/14 2005 11/14/14 0537  BP: 133/55 154/65 148/64 146/61  Pulse: 80 84 89 81  Temp: 98 F (36.7 C) 97.7 F (36.5 C) 98.1 F (36.7 C) 97.7 F (36.5 C)  TempSrc: Oral Oral Oral Oral  Resp: 18 20 22 18   Height:      Weight:    76.853 kg (169 lb 6.9 oz)  SpO2: 100% 100% 98% 100%   Exam: Alert, no distress No JVD Chest clear bilat RRR no MRG Abd soft, NTND, no mass or ascites No LE or UE edema  HD: TTS [place] 4h 72.5kg 2/2.25 Bath 400/A1.5 LUA AVG Heparin 2400 Aranesp 200 / wk, Hect 5 ug, Venofer 100/ hd thru 1/28       Assessment: 1. Cardiac arrest / severe hyperkalemia - s/p cooling protocol 2. ESRD on HD TTS, s/p CRRT 3. Anemia s/p transfusion, cont max esa, IV Fe 4. Resp failure / acute d/t pulm edema - resolved 5. Hx failed KP transplant 6. HTN - takes norvasc/ labet / clon at home. On norvasc only now 7. MBD cont vit D, resume sensipar/ phoslo 8. Vol - up 4 kg today, UF same with HD tomorrow  Plan- add back labetalol 300 bid today, HD tomorrow    Kelly Splinter MD  pager [phone]    cell [phone]  11/14/2014, 9:21 AM     Recent Labs Lab 11/08/14 1053 11/09/14 1500 11/12/14 0355  NA 132* 126* 128*  K 4.3 3.8 5.6*  CL 97 91* 96  CO2 22 18* 21  GLUCOSE 279* 164* 163*  BUN 75* 101* 25*  CREATININE 5.61* 7.88* 3.95*  CALCIUM 9.4 8.6 8.0*  PHOS 6.2* 9.4*  --     Recent Labs Lab 11/08/14 1053 11/09/14 1500  ALBUMIN 3.1* 2.5*    Recent Labs Lab 11/09/14 1500 11/11/14 0917 11/12/14 0746  WBC 9.8 6.8 7.6  HGB 7.3* 6.9* 9.6*  HCT 21.6* 20.5* 28.9*  MCV 89.3 89.5 87.8  PLT 427* 413* 389   . sodium chloride   Intravenous Once  . amLODipine  10 mg Oral QHS  . antiseptic oral rinse  7 mL Mouth Rinse BID  . aspirin  81 mg Oral Daily  . calcium acetate  2,001 mg Oral  TID WC  . cinacalcet  30 mg Oral Q supper  . [START ON 11/19/2014] darbepoetin (ARANESP) injection - DIALYSIS  200 mcg Intravenous Q Sat-HD  . [START ON 11/15/2014] doxercalciferol  3 mcg Intravenous Q T,Th,Sa-HD  . ferric gluconate (FERRLECIT/NULECIT) IV  62.5 mg Intravenous Q Thu-HD  . heparin subcutaneous  5,000 Units Subcutaneous 3 times per day  . simvastatin  20 mg Oral Q M,W,F     sodium chloride, acetaminophen **OR** [DISCONTINUED] acetaminophen, calcium carbonate (dosed in mg elemental calcium), camphor-menthol **AND** hydrOXYzine, docusate sodium, fentaNYL, ondansetron **OR** ondansetron (ZOFRAN) IV, oxyCODONE-acetaminophen, pneumococcal 23 valent vaccine, sorbitol, zolpidem

## 2014-11-14 NOTE — Procedures (Signed)
I was present at this dialysis session, have reviewed the session itself and made  appropriate changes  Kelly Splinter MD (pgr) [phone]    ([phone] 11/14/2014, 11:54 AM

## 2014-11-14 NOTE — Progress Notes (Signed)
PT Cancellation Note  Pt was not available for PT, she was at HD. Will follow.   Blondell Reveal Kistler PT 11/14/2014  [phone]

## 2014-11-14 NOTE — Progress Notes (Signed)
[facility] PROGRESS NOTE  [name] DJM:[id number] DOB: 1976/05/27 DOA: 10/31/2014 PCP: Alicia Medal, MD  Assessment/Plan: 1. Cardiac arrest 1. Was seen by Cardiology early during this admission 2. Cardiac arrest likely secondary to hyperkalemia as noted on initial presentation 3. Pt did require prolonged CPR, ultimately requiring hypothermic protocol and later stabilization and successful extubation 4. Pt now remains stable on floor 2. ESRD 1. Nephrology is following 2. Undergoing HD normally TTS, currently off-schedule with session given today optimize volume status 3. Resume scheduled HD tomorrow 3. Hyperkalemia 1. Improved with HD 2. Still mildly hyperkalemic 4. Anemia 1. Likely related to ESRD 2. Pt did receive 2 units of PRBC's on 2/5 HD with hgb improved from 6.9 to 9.6 3. Continue to follow cbc and transfuse as needed 5. Pulm edema 1. Improved with HD 2. Currently on min O2 support 6. HTN 1. BP suboptimally controlled 2. Pt noted to be hypotensive during HD early during this course 3. Amlodipine added back on 2/7 with home labetalol added back 2.8 7. Chest pain 1. Likely musculoskeletal secondary to chest compressions 2. On analgesics as tolerated 8. DVT prophylaxis 1. Heparin subq  Code Status: Full Family Communication: Pt in room Disposition Plan: Pending   Consultants:  Cardiology  Critical care  Nephrology  Procedures: CT head 1/25: ?acute sinusitis pCXR 1/25: cardiomegaly, increasing interstitial and airspace disease EEG 1/25: Anoxic encephalopathy Echo 1/26: severely reduced EF 25-30%, moderate LVH, grade 2 DD, PA pressure 53mmHg, trivial pericardial effusion EEG 1/29: diffuse cerebral dysfunction, non-specific etiology, no seizures Echo repeat 2/2>>>normlaized EF 50%  Antibiotics: Unasyn 1/25>>1/26 Vancomycin 1/26>>>1/28 Zosyn 1/26 >> 2/03 Ceftriaxone 1/28 >> 1/29  HPI/Subjective: Pt continues to note chest pains requiring  fentanyl  Objective: Filed Vitals:   11/14/14 1200 11/14/14 1230 11/14/14 1300 11/14/14 1342  BP: 150/82 167/84 151/76 174/76  Pulse: 88 84 84 92  Temp:    96.7 F (35.9 C)  TempSrc:    Oral  Resp: 24 22 17 18   Height:      Weight:    73.8 kg (162 lb 11.2 oz)  SpO2:    100%    Intake/Output Summary (Last 24 hours) at 11/14/14 1809 Last data filed at 11/14/14 1500  Gross per 24 hour  Intake    960 ml  Output   3445 ml  Net  -2485 ml   Filed Weights   11/14/14 0537 11/14/14 0940 11/14/14 1342  Weight: 76.853 kg (169 lb 6.9 oz) 77 kg (169 lb 12.1 oz) 73.8 kg (162 lb 11.2 oz)    Exam:   General:  Asleep, on HD chair, in nad  Cardiovascular: regular, s1, s2  Respiratory: normal resp effort, no wheezing  Abdomen: soft,nondistended  Musculoskeletal: perfused, no clubbing   Data Reviewed: Basic Metabolic Panel:  Recent Labs Lab 11/08/14 1053 11/09/14 1500 11/12/14 0355  NA 132* 126* 128*  K 4.3 3.8 5.6*  CL 97 91* 96  CO2 22 18* 21  GLUCOSE 279* 164* 163*  BUN 75* 101* 25*  CREATININE 5.61* 7.88* 3.95*  CALCIUM 9.4 8.6 8.0*  PHOS 6.2* 9.4*  --    Liver Function Tests:  Recent Labs Lab 11/08/14 1053 11/09/14 1500  ALBUMIN 3.1* 2.5*   No results for input(s): LIPASE, AMYLASE in the last 168 hours. No results for input(s): AMMONIA in the last 168 hours. CBC:  Recent Labs Lab 11/08/14 1053 11/09/14 1500 11/11/14 0917 11/12/14 0746  WBC 11.5* 9.8 6.8 7.6  HGB 8.8* 7.3* 6.9* 9.6*  HCT 27.8* 21.6* 20.5* 28.9*  MCV 94.6 89.3 89.5 87.8  PLT 463* 427* 413* 389   Cardiac Enzymes:  Recent Labs Lab 11/08/14 1053  TROPONINI 0.34*   BNP (last 3 results) No results for input(s): BNP in the last 8760 hours.  ProBNP (last 3 results)  Recent Labs  04/18/14 1938  PROBNP >70000.0*    CBG:  Recent Labs Lab 11/13/14 0636 11/13/14 1107 11/13/14 1632 11/13/14 2053 11/14/14 0602  GLUCAP 131* 152* 141* 171* 128*    Recent Results (from the  past 240 hour(s))  Culture, blood (routine x 2)     Status: None   Collection Time: 11/04/14  6:35 PM  Result Value Ref Range Status   Specimen Description BLOOD RIGHT ARM  Final   Special Requests BOTTLES DRAWN AEROBIC AND ANAEROBIC 10CC  Final   Culture   Final    NO GROWTH 5 DAYS Note: Culture results may be compromised due to an excessive volume of blood received in culture bottles. Performed at [facility]    Report Status 11/11/2014 FINAL  Final  Clostridium Difficile by PCR     Status: None   Collection Time: 11/05/14  3:31 PM  Result Value Ref Range Status   C difficile by pcr NEGATIVE NEGATIVE Final  MRSA PCR Screening     Status: None   Collection Time: 11/11/14  8:39 AM  Result Value Ref Range Status   MRSA by PCR NEGATIVE NEGATIVE Final    Comment:        The GeneXpert MRSA Assay (FDA approved for NASAL specimens only), is one component of a comprehensive MRSA colonization surveillance program. It is not intended to diagnose MRSA infection nor to guide or monitor treatment for MRSA infections.      Studies: No results found.  Scheduled Meds: . sodium chloride   Intravenous Once  . amLODipine  10 mg Oral QHS  . antiseptic oral rinse  7 mL Mouth Rinse BID  . aspirin  81 mg Oral Daily  . calcium acetate  2,001 mg Oral TID WC  . cinacalcet  30 mg Oral Q supper  . [START ON 11/19/2014] darbepoetin (ARANESP) injection - DIALYSIS  200 mcg Intravenous Q Sat-HD  . [START ON 11/15/2014] doxercalciferol  3 mcg Intravenous Q T,Th,Sa-HD  . ferric gluconate (FERRLECIT/NULECIT) IV  62.5 mg Intravenous Q Thu-HD  . heparin subcutaneous  5,000 Units Subcutaneous 3 times per day  . simvastatin  20 mg Oral Q M,W,F   Continuous Infusions:   Active Problems:   Essential hypertension   DM (diabetes mellitus)   ESRD on hemodialysis   Cardiac arrest   Respiratory failure   Anoxic encephalopathy   Cardiogenic shock   Acute hyperkalemia   Acute respiratory  failure   [name] K  [facility] Pager [phone]. If 7PM-7AM, please contact night-coverage at [email or web address], password [facility] 11/14/2014, 6:09 PM  LOS: 14 days

## 2014-11-15 LAB — GLUCOSE, CAPILLARY
GLUCOSE-CAPILLARY: 271 mg/dL — AB (ref 70–99)
GLUCOSE-CAPILLARY: 101 mg/dL — AB (ref 70–99)
Glucose-Capillary: 167 mg/dL — ABNORMAL HIGH (ref 70–99)
Glucose-Capillary: 137 mg/dL — ABNORMAL HIGH (ref 70–99)
Glucose-Capillary: 115 mg/dL — ABNORMAL HIGH (ref 70–99)

## 2014-11-15 LAB — CBC
HEMATOCRIT: 28.5 % — AB (ref 36.0–46.0)
HEMOGLOBIN: 9.2 g/dL — AB (ref 12.0–15.0)
MCH: 29.6 pg (ref 26.0–34.0)
MCHC: 32.3 g/dL (ref 30.0–36.0)
MCV: 91.6 fL (ref 78.0–100.0)
Platelets: 418 10*3/uL — ABNORMAL HIGH (ref 150–400)
RBC: 3.11 MIL/uL — AB (ref 3.87–5.11)
RDW: 17.6 % — ABNORMAL HIGH (ref 11.5–15.5)
WBC: 13.5 10*3/uL — ABNORMAL HIGH (ref 4.0–10.5)

## 2014-11-15 LAB — BASIC METABOLIC PANEL
Anion gap: 11 (ref 5–15)
BUN: 24 mg/dL — ABNORMAL HIGH (ref 6–23)
CALCIUM: 8.2 mg/dL — AB (ref 8.4–10.5)
CO2: 25 mmol/L (ref 19–32)
Chloride: 93 mmol/L — ABNORMAL LOW (ref 96–112)
Creatinine, Ser: 5.62 mg/dL — ABNORMAL HIGH (ref 0.50–1.10)
GFR calc Af Amer: 10 mL/min — ABNORMAL LOW (ref >90)
GFR calc non Af Amer: 9 mL/min — ABNORMAL LOW (ref >90)
GLUCOSE: 112 mg/dL — AB (ref 70–99)
Potassium: 4.2 mmol/L (ref 3.5–5.1)
Sodium: 129 mmol/L — ABNORMAL LOW (ref 135–145)

## 2014-11-15 LAB — MAGNESIUM: MAGNESIUM: 2 mg/dL (ref 1.5–2.5)

## 2014-11-15 MED ORDER — OXYCODONE-ACETAMINOPHEN 5-325 MG PO TABS
ORAL_TABLET | ORAL | Status: AC
  Filled 2014-11-15: qty 1

## 2014-11-15 MED ORDER — OXYCODONE-ACETAMINOPHEN 5-325 MG PO TABS: 1.0000 | ORAL_TABLET | ORAL | Status: DC | PRN

## 2014-11-15 MED ORDER — OXYCODONE-ACETAMINOPHEN 5-325 MG PO TABS
1.0000 | ORAL_TABLET | ORAL | Status: DC | PRN
  Administered 2014-11-15 – 2014-11-16 (×2): 1 via ORAL
  Filled 2014-11-15 (×2): qty 1

## 2014-11-15 MED ORDER — DOXERCALCIFEROL 4 MCG/2ML IV SOLN
INTRAVENOUS | Status: AC
  Filled 2014-11-15: qty 2

## 2014-11-15 MED ORDER — OXYCODONE-ACETAMINOPHEN 5-325 MG PO TABS
ORAL_TABLET | ORAL | Status: AC
  Administered 2014-11-15: 15:00:00
  Filled 2014-11-15: qty 1

## 2014-11-15 NOTE — Progress Notes (Signed)
PT Cancellation Note  Patient Details Name: [name] MRN: [id number] DOB: Feb 05, 1976   Cancelled Treatment:    Reason Eval/Treat Not Completed: Patient at procedure or test/unavailable, will attempt to clarify HD schedule for future PT sessions.   Duncan Dull 11/15/2014, 1:40 PM Alben Deeds, [place] DPT  [phone]

## 2014-11-15 NOTE — Discharge Summary (Signed)
Physician Discharge Summary  [name] WGY:[id number] DOB: March 23, 1976 DOA: 10/31/2014  PCP: Tommy Medal, MD  Admit date: 10/31/2014 Discharge date: 11/15/2014  Time spent: 35 minutes  Recommendations for Outpatient Follow-up:  1. Follow up with PCP in 1-2 weeks 2. Cont scheduled HD  Discharge Diagnoses:  Active Problems:   Essential hypertension   DM (diabetes mellitus)   ESRD on hemodialysis   Cardiac arrest   Respiratory failure   Anoxic encephalopathy   Cardiogenic shock   Acute hyperkalemia   Acute respiratory failure   Discharge Condition: Improved  Diet recommendation: Diabetic, heart healthy  Filed Weights   11/14/14 0940 11/14/14 1342 11/15/14 0551  Weight: 77 kg (169 lb 12.1 oz) 73.8 kg (162 lb 11.2 oz) 75.7 kg (166 lb 14.2 oz)    History of present illness:   Please review h and p from 1/25 for details. Briefly, pt presented with cardiac arrest, found to have K of 8.1. The patient was admitted to the ICU for further work up.  Hospital Course:   Cardiac arrest  Was seen by Cardiology early during this admission  Cardiac arrest likely secondary to marked hyperkalemia as noted on initial presentation  Pt did require prolonged CPR, ultimately requiring hypothermic protocol and later stabilization and successful extubation  Pt has since remained stable on floor  ESRD  Nephrology was following  Underwent HD normally TTS, with several off-scheduled sessions to optimize volume status  Resumed scheduled HD 2/9  Hyperkalemia  Resolved with HD  Anemia  Likely related to ESRD  Pt did receive 2 units of PRBC's on 2/5 HD with hgb improved from 6.9 to 9.6  Would continue to follow cbc and transfuse as needed  Pulm edema  Improved with HD  Now on min O2 support  HTN  BP suboptimally controlled  Pt noted to be hypotensive during HD early during this course  Amlodipine added back on 2/7 with home labetalol added back 2.8  Chest pain  Likely  musculoskeletal secondary to chest compressions received prior to admission  Was seen by Cardiology  On analgesics as tolerated  DVT prophylaxis  Heparin subq  Procedures: CT head 1/25: ?acute sinusitis pCXR 1/25: cardiomegaly, increasing interstitial and airspace disease EEG 1/25: Anoxic encephalopathy Echo 1/26: severely reduced EF 25-30%, moderate LVH, grade 2 DD, PA pressure 85mmHg, trivial pericardial effusion EEG 1/29: diffuse cerebral dysfunction, non-specific etiology, no seizures Echo repeat 2/2>>>normlaized EF 50%  Consultations:  Cardiology  Critical care  Nephrology  Discharge Exam: Filed Vitals:   11/15/14 1210 11/15/14 1215 11/15/14 1230 11/15/14 1300  BP: 142/79 158/74 155/76 168/73  Pulse: 89 88 88 95  Temp:      TempSrc:      Resp:      Height:      Weight:      SpO2:        General: Awake, in nad Cardiovascular: regular, s1, s2 Respiratory: normal resp effort, no wheezing  Discharge Instructions     Medication List    STOP taking these medications        ceFAZolin 2-3 GM-% Solr  Commonly known as:  ANCEF      TAKE these medications        acetaminophen 500 MG tablet  Commonly known as:  TYLENOL  Take 1,000 mg by mouth every 6 (six) hours as needed for moderate pain.     amLODipine 10 MG tablet  Commonly known as:  NORVASC  Take 10 mg by mouth at bedtime.  aspirin EC 81 MG tablet  Take 81 mg by mouth at bedtime.     calcium acetate 667 MG capsule  Commonly known as:  PHOSLO  Take 3 capsules (2,001 mg total) by mouth 3 (three) times daily with meals.     cinacalcet 30 MG tablet  Commonly known as:  SENSIPAR  Take 30 mg by mouth daily.     cloNIDine 0.1 MG tablet  Commonly known as:  CATAPRES  Take 0.1 mg by mouth 2 (two) times daily.     diphenhydrAMINE 25 mg capsule  Commonly known as:  BENADRYL  Take 25 mg by mouth daily as needed for allergies.     escitalopram 20 MG tablet  Commonly known as:  LEXAPRO  Take  10 mg by mouth daily.     insulin aspart 100 UNIT/ML injection  Commonly known as:  novoLOG  Inject 12 Units into the skin 2 (two) times daily with breakfast and lunch.     insulin glargine 100 UNIT/ML injection  Commonly known as:  LANTUS  Inject 0.5 mLs (50 Units total) into the skin at bedtime.     labetalol 300 MG tablet  Commonly known as:  NORMODYNE  Take 300 mg by mouth 2 (two) times daily.     metoCLOPramide 5 MG tablet  Commonly known as:  REGLAN  Take 5 mg by mouth 4 (four) times daily.     oxyCODONE-acetaminophen 5-325 MG per tablet  Commonly known as:  PERCOCET/ROXICET  Take 1 tablet by mouth every 4 (four) hours as needed for moderate pain.     pantoprazole 40 MG tablet  Commonly known as:  PROTONIX  Take 40 mg by mouth daily.     RENA-VITE PO  Take 1 tablet by mouth daily.     simvastatin 20 MG tablet  Commonly known as:  ZOCOR  Take 20 mg by mouth every Monday, Wednesday, and Friday.       Allergies  Allergen Reactions  . Ace Inhibitors Cough       Follow-up Information    Follow up with PANG,RICHARD, MD. Schedule an appointment as soon as possible for a visit in 1 week.   Specialty:  Internal Medicine   Contact information:   [address] [place] [address] [phone]        The results of significant diagnostics from this hospitalization (including imaging, microbiology, ancillary and laboratory) are listed below for reference.    Significant Diagnostic Studies: Ct Head Wo Contrast  10/31/2014   CLINICAL DATA:  Dialysis patient with recent cardiac arrest, uncontrollable bleeding  EXAM: CT HEAD WITHOUT CONTRAST  TECHNIQUE: Contiguous axial images were obtained from the base of the skull through the vertex without intravenous contrast.  COMPARISON:  None.  FINDINGS: The bony calvarium is intact. Air-fluid levels are noted within the sphenoid sinuses which may be related to acute sinusitis. Endotracheal tube and nasogastric  catheter are noted. No findings to suggest acute hemorrhage, acute infarction or space-occupying mass lesion are noted.  IMPRESSION: Changes in the sphenoid sinuses which may be related to acute sinusitis. No other focal abnormality is seen.   Electronically Signed   By: Inez Catalina M.D.   On: 10/31/2014 11:19   Dg Chest Port 1 View  11/09/2014   CLINICAL DATA:  Chest discomfort, shortness of breath. Cardiac arrest.  EXAM: PORTABLE CHEST - 1 VIEW  COMPARISON:  11/07/2014, 11/06/2014  FINDINGS: The endotracheal tube, enteric tube, and left central line have been  removed. There is mild worsening of vascular congestion. Probable mild residual pulmonary edema. Bibasilar atelectasis is seen. Cardiomediastinal contours are unchanged. There is no large pleural effusion. No pneumothorax. No acute osseous abnormalities.  IMPRESSION: Mild worsening of vascular congestion. Unchanged bibasilar atelectasis.   Electronically Signed   By: Jeb Levering M.D.   On: 11/09/2014 04:52   Dg Chest Port 1 View  11/07/2014   CLINICAL DATA:  Pneumonia.  EXAM: PORTABLE CHEST - 1 VIEW  COMPARISON:  11/06/2014.  11/05/2014.  FINDINGS: Endotracheal tube and NG tube in stable position. Left IJ line in stable position. Continued improvement of bilateral pulmonary infiltrates. No pleural effusion or pneumothorax. Slight nodularity noted in both lungs most likely represents residual mild edema. Continued follow-up suggested. No pneumothorax. No acute osseous abnormality.  IMPRESSION: 1. Lines and tubes in stable position. 2. Continued resolving congestive heart failure and pulmonary edema. Slight nodularity in noted in both lungs most likely represents mild residual edema. Continued follow-up is suggested.   Electronically Signed   By: Marcello Moores  Register   On: 11/07/2014 07:34   Dg Chest Port 1 View  11/06/2014   CLINICAL DATA:  39 year old female pulmonary edema.  EXAM: PORTABLE CHEST - 1 VIEW  COMPARISON:  Chest x-ray 11/05/2014.   FINDINGS: An endotracheal tube is in place with tip 4.5 cm above the carina. A nasogastric tube is seen extending into the stomach, however, the tip of the nasogastric tube extends below the lower margin of the image. There is a left-sided internal jugular central venous catheter with tip terminating in the left innominate vein near the confluence with the superior vena cava. There is cephalization of the pulmonary vasculature and slight indistinctness of the interstitial markings suggestive of mild pulmonary edema. Small left pleural effusion decreased in size. Previously noted right pleural effusion has resolved. Mild cardiomegaly. The patient is rotated to the left on today's exam, resulting in distortion of the mediastinal contours and reduced diagnostic sensitivity and specificity for mediastinal pathology.  IMPRESSION: 1. The appearance the chest again suggests improving congestive heart failure, as above. 2. Support apparatus, as above.   Electronically Signed   By: Vinnie Langton M.D.   On: 11/06/2014 08:19   Dg Chest Port 1 View  11/05/2014   CLINICAL DATA:  Follow-up acute respiratory failure. Initial encounter.  EXAM: PORTABLE CHEST - 1 VIEW  COMPARISON:  Chest radiograph performed 11/04/2014  FINDINGS: The patient's endotracheal tube is seen ending 3-4 cm above the carina. An enteric tube is noted extending below the diaphragm. A left IJ line is seen ending about the proximal SVC.  Bibasilar airspace opacification is relatively stable from the prior study. Underlying vascular congestion is noted. This most likely reflects pulmonary edema. Small bilateral pleural effusions are suspected. No pneumothorax is seen.  The cardiomediastinal silhouette is borderline normal in size. No acute osseous abnormalities are identified.  IMPRESSION: Relatively stable bibasilar airspace opacification, with underlying vascular congestion, likely reflecting persistent pulmonary edema. Small bilateral pleural effusions  suspected.   Electronically Signed   By: Garald Balding M.D.   On: 11/05/2014 07:51   Dg Chest Port 1 View  11/04/2014   CLINICAL DATA:  Acute respiratory failure  EXAM: PORTABLE CHEST - 1 VIEW  COMPARISON:  11/03/2014  FINDINGS: Cardiac shadow is stable. Endotracheal tube is noted 3 cm above the carina. A nasogastric catheter is seen within the stomach. The left jugular line is again noted in the proximal superior vena cava. Diffuse pulmonary edema is again  identified. Increasing density over the bases bilaterally suggest bilateral effusions. No bony abnormality is noted.  IMPRESSION: Persistent vascular congestion with increasing effusions bilaterally.   Electronically Signed   By: Inez Catalina M.D.   On: 11/04/2014 07:08   Dg Chest Port 1 View  11/03/2014   CLINICAL DATA:  Acute respiratory failure  EXAM: PORTABLE CHEST - 1 VIEW  COMPARISON:  Yesterday  FINDINGS: Tubular device is stable. Diffuse edema improved. Lung volumes are greater. More focal consolidation at the left base persists. Persistent central left upper lobe linear atelectasis. No pneumothorax.  IMPRESSION: Improved edema.  No pneumothorax.   Electronically Signed   By: Maryclare Bean M.D.   On: 11/03/2014 07:46   Dg Chest Port 1 View  11/02/2014   CLINICAL DATA:  Acute respiratory failure  EXAM: PORTABLE CHEST - 1 VIEW  COMPARISON:  11/01/2014  FINDINGS: Endotracheal tube tip is 2 cm above the carina. The nasogastric tube extends well into the stomach and off the inferior edge of the image. The left jugular central line tip is in the expected location of the SVC just below the azygos vein junction.  There are worsening airspace opacities bilaterally. There are small effusions bilaterally, unchanged. No pneumothorax is evident.  IMPRESSION: Worsening airspace opacities bilaterally. Small unchanged pleural effusions.   Electronically Signed   By: Andreas Newport M.D.   On: 11/02/2014 08:35   Dg Chest Port 1 View  11/01/2014   CLINICAL DATA:   Central catheter placement  EXAM: PORTABLE CHEST - 1 VIEW  COMPARISON:  Study obtained earlier in the day  FINDINGS: Endotracheal tube tip is 2.3 cm above the carina. Left jugular catheter tip is in the superior vena cava slightly beyond the junction with the left innominate vein. Nasogastric tube tip and side port are below the diaphragm. No pneumothorax. There is moderate interstitial edema with cardiomegaly and pulmonary venous hypertension. There is airspace consolidation throughout the left lower lobe. There is a small left effusion. There is patchy airspace opacity in the right lower lobe as well.  IMPRESSION: Tube and catheter positions as described without pneumothorax. Persistent left lower lobe consolidation with left effusion. There is also patchy opacity in the right base which may represent alveolar edema. The overall appearance is consistent with congestive heart failure with questionable superimposed pneumonia in the lower lobes, particularly on the left.   Electronically Signed   By: Lowella Grip M.D.   On: 11/01/2014 15:01   Dg Chest Port 1 View  11/01/2014   CLINICAL DATA:  Acute respiratory failure, history hypertension, TB, type 2 diabetes, ovarian neoplasm, end-stage renal disease on dialysis  EXAM: PORTABLE CHEST - 1 VIEW  COMPARISON:  Portable exam 1013 hr compared to 10/31/2014  FINDINGS: Tip of endotracheal tube projects 2.7 cm above carina.  Nasogastric tube extends into stomach.  EKG leads and external pacing lead project over chest.  Borderline enlargement of cardiac silhouette.  Pulmonary vascular congestion.  Diffuse infiltrates likely pulmonary edema  Probable LEFT pleural effusion.  No pneumothorax.  IMPRESSION: Persistent pulmonary infiltrates question pulmonary edema with suspected small LEFT pleural effusion.   Electronically Signed   By: Lavonia [name] M.D.   On: 11/01/2014 10:53   Dg Chest Port 1 View  10/31/2014   CLINICAL DATA:  OG tube placement.  EXAM: PORTABLE CHEST  - 1 VIEW  COMPARISON:  One-view chest 04/18/2014.  FINDINGS: The patient has been intubated. The endotracheal tube terminates 4.9 cm above the carina. The heart is  enlarged. Diffuse interstitial and airspace disease has progressed with more consolidative changes now on the right upper lobe and left lower lobe. The left hemi thorax is obscured by a to fibrillation pad. The orogastric tube courses off the inferior border of the film, within the stomach.  IMPRESSION: 1. Interval intubation. The endotracheal tube is in satisfactory position. 2. OG tube courses off the inferior border the film. 3. Cardiomegaly with increasing interstitial and airspace disease. This likely reflects a combination of edema and atelectasis. Infection or aspiration is also considered.   Electronically Signed   By: Lawrence Santiago M.D.   On: 10/31/2014 10:21    Microbiology: Recent Results (from the past 240 hour(s))  Clostridium Difficile by PCR     Status: None   Collection Time: 11/05/14  3:31 PM  Result Value Ref Range Status   C difficile by pcr NEGATIVE NEGATIVE Final  MRSA PCR Screening     Status: None   Collection Time: 11/11/14  8:39 AM  Result Value Ref Range Status   MRSA by PCR NEGATIVE NEGATIVE Final    Comment:        The GeneXpert MRSA Assay (FDA approved for NASAL specimens only), is one component of a comprehensive MRSA colonization surveillance program. It is not intended to diagnose MRSA infection nor to guide or monitor treatment for MRSA infections.      Labs: Basic Metabolic Panel:  Recent Labs Lab 11/09/14 1500 11/12/14 0355 11/15/14 1135  NA 126* 128* 129*  K 3.8 5.6* 4.2  CL 91* 96 93*  CO2 18* 21 25  GLUCOSE 164* 163* 112*  BUN 101* 25* 24*  CREATININE 7.88* 3.95* 5.62*  CALCIUM 8.6 8.0* 8.2*  MG  --   --  2.0  PHOS 9.4*  --   --    Liver Function Tests:  Recent Labs Lab 11/09/14 1500  ALBUMIN 2.5*   No results for input(s): LIPASE, AMYLASE in the last 168 hours. No  results for input(s): AMMONIA in the last 168 hours. CBC:  Recent Labs Lab 11/09/14 1500 11/11/14 0917 11/12/14 0746 11/15/14 1135  WBC 9.8 6.8 7.6 13.5*  HGB 7.3* 6.9* 9.6* 9.2*  HCT 21.6* 20.5* 28.9* 28.5*  MCV 89.3 89.5 87.8 91.6  PLT 427* 413* 389 418*   Cardiac Enzymes: No results for input(s): CKTOTAL, CKMB, CKMBINDEX, TROPONINI in the last 168 hours. BNP: BNP (last 3 results) No results for input(s): BNP in the last 8760 hours.  ProBNP (last 3 results)  Recent Labs  04/18/14 1938  PROBNP >70000.0*    CBG:  Recent Labs Lab 11/13/14 1107 11/13/14 1632 11/13/14 2053 11/14/14 0602 11/15/14 1127  GLUCAP 152* 141* 171* 128* 101*    Signed:  Dene Landsberg K  [facility] 11/15/2014, 1:54 PM

## 2014-11-15 NOTE — Procedures (Signed)
I was present at this dialysis session, have reviewed the session itself and made  appropriate changes.   Patient is still prob vol overloaded and on O2, will plan to keep here another day with HD tomorrow am with max UF.  Have d/w primary.   Kelly Splinter MD (pgr) [phone]    ([phone] 11/15/2014, 3:17 PM

## 2014-11-15 NOTE — Treatment Plan (Signed)
Discussed case with Dr. Jonnie Finner. Pt continues to have signs of volume overload. Nephrology recommends holding d/c today. Would re-eval tomorrow.

## 2014-11-16 ENCOUNTER — Inpatient Hospital Stay (HOSPITAL_COMMUNITY): Payer: Medicare Other

## 2014-11-16 LAB — CBC
HEMATOCRIT: 28.3 % — AB (ref 36.0–46.0)
HEMOGLOBIN: 9.1 g/dL — AB (ref 12.0–15.0)
MCH: 29.4 pg (ref 26.0–34.0)
MCHC: 32.2 g/dL (ref 30.0–36.0)
MCV: 91.3 fL (ref 78.0–100.0)
Platelets: 437 10*3/uL — ABNORMAL HIGH (ref 150–400)
RBC: 3.1 MIL/uL — ABNORMAL LOW (ref 3.87–5.11)
RDW: 18 % — AB (ref 11.5–15.5)
WBC: 23.1 10*3/uL — ABNORMAL HIGH (ref 4.0–10.5)

## 2014-11-16 LAB — RENAL FUNCTION PANEL
Albumin: 2.5 g/dL — ABNORMAL LOW (ref 3.5–5.2)
Anion gap: 12 (ref 5–15)
BUN: 16 mg/dL (ref 6–23)
CO2: 24 mmol/L (ref 19–32)
Calcium: 7.9 mg/dL — ABNORMAL LOW (ref 8.4–10.5)
Chloride: 91 mmol/L — ABNORMAL LOW (ref 96–112)
Creatinine, Ser: 5.05 mg/dL — ABNORMAL HIGH (ref 0.50–1.10)
GFR calc Af Amer: 12 mL/min — ABNORMAL LOW (ref >90)
GFR calc non Af Amer: 10 mL/min — ABNORMAL LOW (ref >90)
GLUCOSE: 102 mg/dL — AB (ref 70–99)
PHOSPHORUS: 3.9 mg/dL (ref 2.3–4.6)
Potassium: 4.3 mmol/L (ref 3.5–5.1)
Sodium: 127 mmol/L — ABNORMAL LOW (ref 135–145)

## 2014-11-16 LAB — GLUCOSE, CAPILLARY
GLUCOSE-CAPILLARY: 129 mg/dL — AB (ref 70–99)
GLUCOSE-CAPILLARY: 102 mg/dL — AB (ref 70–99)
Glucose-Capillary: 98 mg/dL (ref 70–99)

## 2014-11-16 MED ORDER — PENTAFLUOROPROP-TETRAFLUOROETH EX AERO: 1.0000 "application " | INHALATION_SPRAY | CUTANEOUS | Status: DC | PRN

## 2014-11-16 MED ORDER — NEPRO/CARBSTEADY PO LIQD: 237.0000 mL | ORAL | Status: DC | PRN

## 2014-11-16 MED ORDER — SODIUM CHLORIDE 0.9 % IV SOLN: 100.0000 mL | INTRAVENOUS | Status: DC | PRN

## 2014-11-16 MED ORDER — ALTEPLASE 2 MG IJ SOLR: 2.0000 mg | Freq: Once | INTRAMUSCULAR | Status: DC | PRN

## 2014-11-16 MED ORDER — LIDOCAINE-PRILOCAINE 2.5-2.5 % EX CREA: 1.0000 "application " | TOPICAL_CREAM | CUTANEOUS | Status: DC | PRN

## 2014-11-16 MED ORDER — ACETAMINOPHEN 325 MG PO TABS
ORAL_TABLET | ORAL | Status: AC
  Administered 2014-11-16: 650 mg via ORAL
  Filled 2014-11-16: qty 2

## 2014-11-16 MED ORDER — HEPARIN SODIUM (PORCINE) 1000 UNIT/ML DIALYSIS: 2400.0000 [IU] | Freq: Once | INTRAMUSCULAR | Status: DC

## 2014-11-16 MED ORDER — HEPARIN SODIUM (PORCINE) 1000 UNIT/ML DIALYSIS: 1000.0000 [IU] | INTRAMUSCULAR | Status: DC | PRN

## 2014-11-16 MED ORDER — OXYCODONE-ACETAMINOPHEN 5-325 MG PO TABS
ORAL_TABLET | ORAL | Status: AC
  Filled 2014-11-16: qty 1

## 2014-11-16 MED ORDER — LIDOCAINE HCL (PF) 1 % IJ SOLN: 5.0000 mL | INTRAMUSCULAR | Status: DC | PRN

## 2014-11-16 NOTE — Progress Notes (Signed)
  NUTRITION FOLLOW-UP INTERVENTION: Discontinue Magic Cup BID Continue Nepro Shake PRN  NUTRITION DIAGNOSIS:  Inadequate oral intake related to decreased appetite as evidenced by poor PO intake at 30%; discontinued  Goal:T Pt to meet >/= 90% of their estimated nutrition needs; met   Monitor:  PO intake, labs, weight  ASSESSMENT: 39 yo female with extensive PMH including HTN, ESRD, previous TB, DM, kidney-pancreas transplant 2010, on home O2 with witnessed arrest at home.  Patient extubated 2/1. Pt is now eating well with meal completion 100% at most meals. Weight is now down 8 lbs from admission weight.  -8.2 L fluid balance. D/C summary yesterday but, per MD note pt has signs of volume overload- holding d/c.  Labs: low sodium, low hemoglobin, low calcium, high creatinine  Height: Ht Readings from Last 1 Encounters:  10/31/14 _0  (1.702 m)    Weight: Wt Readings from Last 1 Encounters:  11/16/14 165 lb (75.1 kg)  Admission weight: 173 lb (78.6 kg) 1/25  BMI: Body mass index is 25.8 kg/(m^2).  Estimated Nutritional Needs: Kcal: 2050-2200  Protein: 105-115 grams Fluid: 1.2 L/day  Skin: WDL; +1 generalized edema  Diet Order: Renal diet with fluid restriction  Intake/Output Summary (Last 24 hours) at 11/04/14 1109 Last data filed at 11/04/14 0831  Gross per 24 hour  Intake 1555.42 ml  Output  0 ml  Net 1555.42 ml    Last BM: 2/9  Labs:   Last Labs      Recent Labs Lab 11/01/14 1809  11/02/14 0500  11/02/14 1500 11/03/14 0400 11/04/14 0400  NA 134*  135 < > 134* < > 134* 133* 134*  K 6.7*  6.7* < > 4.9 < > 4.9 4.9 5.1  CL 95*  94* < > 96 < > 100 101 100  CO2 31  26 < > 28 --  _1 BUN 30*  29* < > 19 < > 18 24* 66*  CREATININE 5.82*  5.84* < > 4.32* < > 3.27* 2.74* 4.28*  CALCIUM 8.2*  8.1* < > 8.1* --  7.8* 8.0* 8.6  MG --  --  2.0 --   --  2.3 2.8*  PHOS 7.1* --  --  --  4.3 4.5 --   GLUCOSE 158*  158* < > 105* < > 142* 127* 132*  < > = values in this interval not displayed.    CBG (last 3)   Recent Labs (last 2 labs)      Recent Labs  11/04/14 0353 11/04/14 0546 11/04/14 0753  GLUCAP 88 100* 145*       Pryor Ochoa RD, LDN Inpatient Clinical Dietitian Pager: [phone] After Hours Pager: [phone]

## 2014-11-16 NOTE — Progress Notes (Signed)
Physical Therapy Treatment Patient Details Name: NIL BOLSER MRN: [id number] DOB: [birth year] Today's Date: 11-19-14    History of Present Illness 39 yo female with extensive PMH including HTN, ESRD on HD, previous TB, DM, kidney-pancreas transplant 2010, on home O2 with witnessed arrest at home. 1/26: admit s/p cardiac arrest, HD, hypothermia protocol, hypotensive during HD, 1/26: CVVHD started, 1/27: warmed, 2/1- extubation.     PT Comments    Patient ambulated with assist this session. Patient appears frustrates, at completion of session patient nurse gave AMA paperwork and patient signed off, PT session concluded. OF NOTE patient HR elevated 130s during ambulation.   Follow Up Recommendations  Home health PT;Supervision/Assistance - 24 hour (for balance and activity tolerance)     Equipment Recommendations  None recommended by PT    Recommendations for Other Services       Precautions / Restrictions Precautions Precautions: Fall Restrictions Weight Bearing Restrictions: No    Mobility  Bed Mobility Overal bed mobility: Modified Independent                Transfers Overall transfer level: Needs assistance Equipment used: 1 person hand held assist   Sit to Stand: Supervision;Min guard Stand pivot transfers: Min guard       General transfer comment: Patient with some instability during elevation to upright  Ambulation/Gait Ambulation/Gait assistance: Independent Ambulation Distance (Feet): 200 Feet Assistive device: None     Gait velocity interpretation: Below normal speed for age/gender General Gait Details: modest instability noted   Stairs            Wheelchair Mobility    Modified Rankin (Stroke Patients Only)       Balance                                    Cognition Arousal/Alertness: Awake/alert Behavior During Therapy: Flat affect Overall Cognitive Status: Difficult to assess                       Exercises      General Comments        Pertinent Vitals/Pain      Home Living                      Prior Function Level of Independence: Independent          PT Goals (current goals can now be found in the care plan section) Acute Rehab PT Goals Patient Stated Goal: to go home PT Goal Formulation: With patient/family Time For Goal Achievement: 11/24/14 Potential to Achieve Goals: Good    Frequency  Min 3X/week    PT Plan      Co-evaluation             End of Session   Activity Tolerance: Patient limited by pain Patient left: in chair;with call bell/phone within reach;with family/visitor present     Time: 9833-8250 PT Time Calculation (min) (ACUTE ONLY): 14 min  Charges:  $Gait Training: 8-22 mins                    G CodesDuncan Dull 11/19/14, 2:27 PM Alben Deeds, [place] DPT  [phone]

## 2014-11-16 NOTE — Progress Notes (Signed)
Patient Demographics  [name], is a 39 y.o. female, DOB - [birth year], MOL:[id number]  Admit date - 10/31/2014   Admitting Physician Rush Farmer, MD  Outpatient Primary MD for the patient is Tommy Medal, MD  LOS - 16   Chief Complaint  Patient presents with  . Cardiac Arrest      Summary  39 yo female with extensive PMH including HTN, ESRD on HD, previous TB, DM, kidney-pancreas transplant 2010, on home O2 with witnessed arrest at home. Had her HD as regularly scheduled on 1/22. Husband performed CPR. Wide complex rhythm on EMS arrival. Total ~1 hour CPR during multiple episodes, 7 epi, 6 defib. K+ in ER was 8.1. PCCM called to admit. She was stabilized by critical care and transferred to hospitalist service, she was transferred to my service on 11/16/2014, day 16 of her hospital stay.    Subjective:   [name] today has, No headache, No chest pain, No abdominal pain - No Nausea, No new weakness tingling or numbness, No Cough - SOB.    Assessment & Plan    1. Cardiac arrest. Due to hyperkalemia. Required prolonged CPR, hypothermia to to call, was seen by cardiology stabilized in ICU and then transferred to hospitalist service. Currently no chest complaints. Potassium stable with dialysis. Echo shows an EF of 25% with moderate LVH. Further care per cardiology. On aspirin and statin.   2. ESRD. Tuesday Thursday Saturday dialysis schedule, still in volume overload being dialyzed with extra runs per renal.   3. Anemia of chronic disease. Due to ESRD. Required 2 units of packed RBC transmission on 11/11/2014. H&H stable. Continue to monitor.   4. 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 Fluid removal with dialysis. Does not make urine. Cardura G renal on board.   5.  Essential hypertension. Currently on beta blocker, blood pressure stable.   6. Leukocytosis noted on 11/16/2014. White count of 23,000. Does have diarrhea, rule out C. difficile, chest x-ray nonacute, no cough or shortness of breath, blood cultures drawn. We will monitor. She was febrile to 102 during dialysis.    Code Status: Full  Family Communication: none   Disposition Plan: TBD   Procedures    CT head 1/25: ?acute sinusitis pCXR 1/25: cardiomegaly, increasing interstitial and airspace disease EEG 1/25: Anoxic encephalopathy Echo 1/26: severely reduced EF 25-30%, moderate LVH, grade 2 DD, PA pressure 87mmHg, trivial pericardial effusion EEG 1/29: diffuse cerebral dysfunction, non-specific etiology, no seizures Echo repeat 2/2>>>normlaized EF 50%  Consults  Renal, CCS, Cards   Medications  Scheduled Meds: . amLODipine  10 mg Oral QHS  . antiseptic oral rinse  7 mL Mouth Rinse BID  . aspirin  81 mg Oral Daily  . calcium acetate  2,001 mg Oral TID WC  . cinacalcet  30 mg Oral Q supper  . [START ON 11/19/2014] darbepoetin (ARANESP) injection - DIALYSIS  200 mcg Intravenous Q Sat-HD  . doxercalciferol  3 mcg Intravenous Q T,Th,Sa-HD  . ferric gluconate (FERRLECIT/NULECIT) IV  62.5 mg Intravenous Q Thu-HD  . heparin subcutaneous  5,000 Units Subcutaneous 3 times per day  . simvastatin  20 mg Oral Q M,W,F   Continuous Infusions:  PRN Meds:.sodium chloride, acetaminophen **OR** [DISCONTINUED]  acetaminophen, calcium carbonate (dosed in mg elemental calcium), camphor-menthol **AND** hydrOXYzine, docusate sodium, ondansetron **OR** ondansetron (ZOFRAN) IV, oxyCODONE-acetaminophen, pneumococcal 23 valent vaccine, sorbitol, zolpidem  DVT Prophylaxis    Heparin    Lab Results  Component Value Date   PLT 437* 11/16/2014    Antibiotics     Anti-infectives    Start     Dose/Rate Route Frequency Ordered Stop   11/07/14 1400  piperacillin-tazobactam (ZOSYN) IVPB 2.25 g  Status:   Discontinued     2.25 g 100 mL/hr over 30 Minutes Intravenous 3 times per day 11/07/14 1040 11/09/14 1215   11/04/14 1400  piperacillin-tazobactam (ZOSYN) IVPB 3.375 g  Status:  Discontinued     3.375 g 12.5 mL/hr over 240 Minutes Intravenous 4 times per day 11/04/14 1314 11/07/14 1040   11/03/14 0900  cefTRIAXone (ROCEPHIN) 1 g in dextrose 5 % 50 mL IVPB - Premix  Status:  Discontinued     1 g 100 mL/hr over 30 Minutes Intravenous Every 24 hours 11/03/14 0802 11/04/14 1314   11/02/14 1300  vancomycin (VANCOCIN) IVPB 750 mg/150 ml premix  Status:  Discontinued     750 mg 150 mL/hr over 60 Minutes Intravenous Every 24 hours 11/01/14 1302 11/03/14 0802   11/01/14 1800  piperacillin-tazobactam (ZOSYN) IVPB 2.25 g  Status:  Discontinued     2.25 g 100 mL/hr over 30 Minutes Intravenous 4 times per day 11/01/14 1302 11/01/14 1505   11/01/14 1515  piperacillin-tazobactam (ZOSYN) IVPB 2.25 g  Status:  Discontinued     2.25 g 100 mL/hr over 30 Minutes Intravenous Every 6 hours 11/01/14 1505 11/03/14 0802   11/01/14 1200  piperacillin-tazobactam (ZOSYN) IVPB 2.25 g  Status:  Discontinued     2.25 g 100 mL/hr over 30 Minutes Intravenous Every 8 hours 11/01/14 1111 11/01/14 1302   11/01/14 1115  vancomycin (VANCOCIN) 1,750 mg in sodium chloride 0.9 % 500 mL IVPB     1,750 mg 250 mL/hr over 120 Minutes Intravenous  Once 11/01/14 1111 11/01/14 1415   10/31/14 1130  ampicillin-sulbactam (UNASYN) 1.5 g in sodium chloride 0.9 % 50 mL IVPB  Status:  Discontinued     1.5 g 100 mL/hr over 30 Minutes Intravenous Every 12 hours 10/31/14 1115 11/01/14 1005          Objective:   Filed Vitals:   11/16/14 1008 11/16/14 1036 11/16/14 1105 11/16/14 1130  BP: 125/64 110/66 122/63 98/64  Pulse: 105 117 114 118  Temp:   102.1 F (38.9 C) 98.8 F (37.1 C)  TempSrc:   Oral Oral  Resp: 17 26 26 22   Height:      Weight:   71.3 kg (157 lb 3 oz)   SpO2:   100% 96%    Wt Readings from Last 3 Encounters:    11/16/14 71.3 kg (157 lb 3 oz)  04/23/14 78.6 kg (173 lb 4.5 oz)  03/02/14 74.3 kg (163 lb 12.8 oz)     Intake/Output Summary (Last 24 hours) at 11/16/14 1241 Last data filed at 11/16/14 1105  Gross per 24 hour  Intake    720 ml  Output   5905 ml  Net  -5185 ml     Physical Exam  Awake Alert, Oriented X 3, No new F.N deficits, Normal affect [place].AT,PERRAL Supple Neck,No JVD, No cervical lymphadenopathy appriciated.  Symmetrical Chest wall movement, Good air movement bilaterally, CTAB RRR,No Gallops,Rubs or new Murmurs, No Parasternal Heave +ve B.Sounds, Abd Soft, No tenderness, No organomegaly appriciated,  No rebound - guarding or rigidity. No Cyanosis, Clubbing or edema, No new Rash or bruise, small wound R knee healing   Data Review   Micro Results Recent Results (from the past 240 hour(s))  MRSA PCR Screening     Status: None   Collection Time: 11/11/14  8:39 AM  Result Value Ref Range Status   MRSA by PCR NEGATIVE NEGATIVE Final    Comment:        The GeneXpert MRSA Assay (FDA approved for NASAL specimens only), is one component of a comprehensive MRSA colonization surveillance program. It is not intended to diagnose MRSA infection nor to guide or monitor treatment for MRSA infections.     Radiology Reports Ct Head Wo Contrast  10/31/2014   CLINICAL DATA:  Dialysis patient with recent cardiac arrest, uncontrollable bleeding  EXAM: CT HEAD WITHOUT CONTRAST  TECHNIQUE: Contiguous axial images were obtained from the base of the skull through the vertex without intravenous contrast.  COMPARISON:  None.  FINDINGS: The bony calvarium is intact. Air-fluid levels are noted within the sphenoid sinuses which may be related to acute sinusitis. Endotracheal tube and nasogastric catheter are noted. No findings to suggest acute hemorrhage, acute infarction or space-occupying mass lesion are noted.  IMPRESSION: Changes in the sphenoid sinuses which may be related to acute  sinusitis. No other focal abnormality is seen.   Electronically Signed   By: Inez Catalina M.D.   On: 10/31/2014 11:19   Dg Chest Port 1 View  11/16/2014   CLINICAL DATA:  39 year old female with shortness of breath  EXAM: PORTABLE CHEST - 1 VIEW  COMPARISON:  Prior chest x-ray 11/09/2014  FINDINGS: Cardiomegaly. Diffuse bilateral interstitial and airspace opacities more confluent in the bases. Probable small right pleural effusion. No pneumothorax. No acute osseous abnormality.  IMPRESSION: Cardiomegaly with moderate pulmonary edema and small right pleural effusion. Findings suggest CHF versus volume overload.   Electronically Signed   By: Jacqulynn Cadet M.D.   On: 11/16/2014 10:56   Dg Chest Port 1 View  11/09/2014   CLINICAL DATA:  Chest discomfort, shortness of breath. Cardiac arrest.  EXAM: PORTABLE CHEST - 1 VIEW  COMPARISON:  11/07/2014, 11/06/2014  FINDINGS: The endotracheal tube, enteric tube, and left central line have been removed. There is mild worsening of vascular congestion. Probable mild residual pulmonary edema. Bibasilar atelectasis is seen. Cardiomediastinal contours are unchanged. There is no large pleural effusion. No pneumothorax. No acute osseous abnormalities.  IMPRESSION: Mild worsening of vascular congestion. Unchanged bibasilar atelectasis.   Electronically Signed   By: Jeb Levering M.D.   On: 11/09/2014 04:52   Dg Chest Port 1 View  11/07/2014   CLINICAL DATA:  Pneumonia.  EXAM: PORTABLE CHEST - 1 VIEW  COMPARISON:  11/06/2014.  11/05/2014.  FINDINGS: Endotracheal tube and NG tube in stable position. Left IJ line in stable position. Continued improvement of bilateral pulmonary infiltrates. No pleural effusion or pneumothorax. Slight nodularity noted in both lungs most likely represents residual mild edema. Continued follow-up suggested. No pneumothorax. No acute osseous abnormality.  IMPRESSION: 1. Lines and tubes in stable position. 2. Continued resolving congestive heart  failure and pulmonary edema. Slight nodularity in noted in both lungs most likely represents mild residual edema. Continued follow-up is suggested.   Electronically Signed   By: Marcello Moores  Register   On: 11/07/2014 07:34   Dg Chest Port 1 View  11/06/2014   CLINICAL DATA:  39 year old female pulmonary edema.  EXAM: PORTABLE CHEST - 1 VIEW  COMPARISON:  Chest x-ray 11/05/2014.  FINDINGS: An endotracheal tube is in place with tip 4.5 cm above the carina. A nasogastric tube is seen extending into the stomach, however, the tip of the nasogastric tube extends below the lower margin of the image. There is a left-sided internal jugular central venous catheter with tip terminating in the left innominate vein near the confluence with the superior vena cava. There is cephalization of the pulmonary vasculature and slight indistinctness of the interstitial markings suggestive of mild pulmonary edema. Small left pleural effusion decreased in size. Previously noted right pleural effusion has resolved. Mild cardiomegaly. The patient is rotated to the left on today's exam, resulting in distortion of the mediastinal contours and reduced diagnostic sensitivity and specificity for mediastinal pathology.  IMPRESSION: 1. The appearance the chest again suggests improving congestive heart failure, as above. 2. Support apparatus, as above.   Electronically Signed   By: Vinnie Langton M.D.   On: 11/06/2014 08:19   Dg Chest Port 1 View  11/05/2014   CLINICAL DATA:  Follow-up acute respiratory failure. Initial encounter.  EXAM: PORTABLE CHEST - 1 VIEW  COMPARISON:  Chest radiograph performed 11/04/2014  FINDINGS: The patient's endotracheal tube is seen ending 3-4 cm above the carina. An enteric tube is noted extending below the diaphragm. A left IJ line is seen ending about the proximal SVC.  Bibasilar airspace opacification is relatively stable from the prior study. Underlying vascular congestion is noted. This most likely reflects  pulmonary edema. Small bilateral pleural effusions are suspected. No pneumothorax is seen.  The cardiomediastinal silhouette is borderline normal in size. No acute osseous abnormalities are identified.  IMPRESSION: Relatively stable bibasilar airspace opacification, with underlying vascular congestion, likely reflecting persistent pulmonary edema. Small bilateral pleural effusions suspected.   Electronically Signed   By: Garald Balding M.D.   On: 11/05/2014 07:51   Dg Chest Port 1 View  11/04/2014   CLINICAL DATA:  Acute respiratory failure  EXAM: PORTABLE CHEST - 1 VIEW  COMPARISON:  11/03/2014  FINDINGS: Cardiac shadow is stable. Endotracheal tube is noted 3 cm above the carina. A nasogastric catheter is seen within the stomach. The left jugular line is again noted in the proximal superior vena cava. Diffuse pulmonary edema is again identified. Increasing density over the bases bilaterally suggest bilateral effusions. No bony abnormality is noted.  IMPRESSION: Persistent vascular congestion with increasing effusions bilaterally.   Electronically Signed   By: Inez Catalina M.D.   On: 11/04/2014 07:08   Dg Chest Port 1 View  11/03/2014   CLINICAL DATA:  Acute respiratory failure  EXAM: PORTABLE CHEST - 1 VIEW  COMPARISON:  Yesterday  FINDINGS: Tubular device is stable. Diffuse edema improved. Lung volumes are greater. More focal consolidation at the left base persists. Persistent central left upper lobe linear atelectasis. No pneumothorax.  IMPRESSION: Improved edema.  No pneumothorax.   Electronically Signed   By: Maryclare Bean M.D.   On: 11/03/2014 07:46   Dg Chest Port 1 View  11/02/2014   CLINICAL DATA:  Acute respiratory failure  EXAM: PORTABLE CHEST - 1 VIEW  COMPARISON:  11/01/2014  FINDINGS: Endotracheal tube tip is 2 cm above the carina. The nasogastric tube extends well into the stomach and off the inferior edge of the image. The left jugular central line tip is in the expected location of the SVC just  below the azygos vein junction.  There are worsening airspace opacities bilaterally. There are small effusions bilaterally, unchanged. No pneumothorax  is evident.  IMPRESSION: Worsening airspace opacities bilaterally. Small unchanged pleural effusions.   Electronically Signed   By: Andreas Newport M.D.   On: 11/02/2014 08:35   Dg Chest Port 1 View  11/01/2014   CLINICAL DATA:  Central catheter placement  EXAM: PORTABLE CHEST - 1 VIEW  COMPARISON:  Study obtained earlier in the day  FINDINGS: Endotracheal tube tip is 2.3 cm above the carina. Left jugular catheter tip is in the superior vena cava slightly beyond the junction with the left innominate vein. Nasogastric tube tip and side port are below the diaphragm. No pneumothorax. There is moderate interstitial edema with cardiomegaly and pulmonary venous hypertension. There is airspace consolidation throughout the left lower lobe. There is a small left effusion. There is patchy airspace opacity in the right lower lobe as well.  IMPRESSION: Tube and catheter positions as described without pneumothorax. Persistent left lower lobe consolidation with left effusion. There is also patchy opacity in the right base which may represent alveolar edema. The overall appearance is consistent with congestive heart failure with questionable superimposed pneumonia in the lower lobes, particularly on the left.   Electronically Signed   By: Lowella Grip M.D.   On: 11/01/2014 15:01   Dg Chest Port 1 View  11/01/2014   CLINICAL DATA:  Acute respiratory failure, history hypertension, TB, type 2 diabetes, ovarian neoplasm, end-stage renal disease on dialysis  EXAM: PORTABLE CHEST - 1 VIEW  COMPARISON:  Portable exam 1013 hr compared to 10/31/2014  FINDINGS: Tip of endotracheal tube projects 2.7 cm above carina.  Nasogastric tube extends into stomach.  EKG leads and external pacing lead project over chest.  Borderline enlargement of cardiac silhouette.  Pulmonary vascular  congestion.  Diffuse infiltrates likely pulmonary edema  Probable LEFT pleural effusion.  No pneumothorax.  IMPRESSION: Persistent pulmonary infiltrates question pulmonary edema with suspected small LEFT pleural effusion.   Electronically Signed   By: Lavonia [name] M.D.   On: 11/01/2014 10:53   Dg Chest Port 1 View  10/31/2014   CLINICAL DATA:  OG tube placement.  EXAM: PORTABLE CHEST - 1 VIEW  COMPARISON:  One-view chest 04/18/2014.  FINDINGS: The patient has been intubated. The endotracheal tube terminates 4.9 cm above the carina. The heart is enlarged. Diffuse interstitial and airspace disease has progressed with more consolidative changes now on the right upper lobe and left lower lobe. The left hemi thorax is obscured by a to fibrillation pad. The orogastric tube courses off the inferior border of the film, within the stomach.  IMPRESSION: 1. Interval intubation. The endotracheal tube is in satisfactory position. 2. OG tube courses off the inferior border the film. 3. Cardiomegaly with increasing interstitial and airspace disease. This likely reflects a combination of edema and atelectasis. Infection or aspiration is also considered.   Electronically Signed   By: Lawrence Santiago M.D.   On: 10/31/2014 10:21     CBC  Recent Labs Lab 11/09/14 1500 11/11/14 0917 11/12/14 0746 11/15/14 1135 11/16/14 0700  WBC 9.8 6.8 7.6 13.5* 23.1*  HGB 7.3* 6.9* 9.6* 9.2* 9.1*  HCT 21.6* 20.5* 28.9* 28.5* 28.3*  PLT 427* 413* 389 418* 437*  MCV 89.3 89.5 87.8 91.6 91.3  MCH 30.2 30.1 29.2 29.6 29.4  MCHC 33.8 33.7 33.2 32.3 32.2  RDW 18.1* 18.1* 17.9* 17.6* 18.0*    Chemistries   Recent Labs Lab 11/09/14 1500 11/12/14 0355 11/15/14 1135 11/16/14 0700  NA 126* 128* 129* 127*  K 3.8 5.6* 4.2 4.3  CL 91* 96 93* 91*  CO2 18* 21 25 24   GLUCOSE 164* 163* 112* 102*  BUN 101* 25* 24* 16  CREATININE 7.88* 3.95* 5.62* 5.05*  CALCIUM 8.6 8.0* 8.2* 7.9*  MG  --   --  2.0  --     ------------------------------------------------------------------------------------------------------------------ estimated creatinine clearance is 14.7 mL/min (by C-G formula based on Cr of 5.05). ------------------------------------------------------------------------------------------------------------------ No results for input(s): HGBA1C in the last 72 hours. ------------------------------------------------------------------------------------------------------------------ No results for input(s): CHOL, HDL, LDLCALC, TRIG, CHOLHDL, LDLDIRECT in the last 72 hours. ------------------------------------------------------------------------------------------------------------------ No results for input(s): TSH, T4TOTAL, T3FREE, THYROIDAB in the last 72 hours.  Invalid input(s): FREET3 ------------------------------------------------------------------------------------------------------------------ No results for input(s): VITAMINB12, FOLATE, FERRITIN, TIBC, IRON, RETICCTPCT in the last 72 hours.  Coagulation profile No results for input(s): INR, PROTIME in the last 168 hours.  No results for input(s): DDIMER in the last 72 hours.  Cardiac Enzymes No results for input(s): CKMB, TROPONINI, MYOGLOBIN in the last 168 hours.  Invalid input(s): CK ------------------------------------------------------------------------------------------------------------------ Invalid input(s): POCBNP     Time Spent in minutes   35   Labaron Digirolamo K M.D on 11/16/2014 at 12:41 PM  Between 7am to 7pm - Pager - [phone]  After 7pm go to [email or web address] - [facility] Office  [phone]

## 2014-11-16 NOTE — Progress Notes (Signed)
Patient signed AMA forms. MD notified.

## 2014-11-16 NOTE — Procedures (Signed)
I was present at this dialysis session, have reviewed the session itself and made  appropriate changes  Kelly Splinter MD (pgr) [phone]    [phone] 11/16/2014, 10:20 AM

## 2014-11-16 NOTE — Progress Notes (Signed)
[facility] Progress Note  Assessment/Plan: 1. Cardiac arrest/^ K resolved - s/p cooling protocol 2. ESRD -TTS -  chronic hyponatremia  This admission; will see if more aggressive volume removal improves; plan HD tomorrow to keep on shedule - but will only run 3.5 hr because of running today 3. Anemia - s/p transfusion Hgb 9.1 - follow outpt per protocol, on max ESA 4. Secondary hyperparathyroidism - current meds P ok 5. HTN/volume - excess volume- here for extra treatment today before d/c to get to EDW; BP better today; not on labetolol and BP better - need to titrate volume down as much as tolerated and then start back meds; BP actually increasing during treatment. 6. Nutrition - renal diet 7. New fever/ Leukocytosis - WBC up to 23 K   tmax 99.8 pre HD - check BC x 2 on HD- will need empiric antibiotics defer to primary  .Myriam Jacobson, PA-C [place] [phone] 11/16/2014,9:02 AM  LOS: 16 days   Pt seen, examined and agree w A/P as above. New WBC and low grade temp, no symptoms. Blood cx's and CXR ordered. HD today and tomorrow for vol excess.  Kelly Splinter MD pager [phone]    cell [phone] 11/16/2014, 10:18 AM    Subjective:   Uses O2 at home 24/7. Denies cough. Doesn't make urine, diarrhea last night  Objective Filed Vitals:   11/16/14 0727 11/16/14 0746 11/16/14 0813 11/16/14 0856  BP: 122/64 128/62 119/61 146/62  Pulse: 99 91 90 96  Temp:      TempSrc:      Resp: 24 20 18 17   Height:      Weight:      SpO2:       Physical Exam pre HD weight 75 standing goal 4.5 General: NAD on HD starting to chill Heart: RRR Lungs: no rales, soft rhonchi on right Abdomen: soft NT Extremities: no edema Qb 400, scabbed knee - doesn't appear new Dialysis Access: left upper AVGG Qb 400  Dialysis Orders: TTS [place] 4h 72.5kg 2/2.25 Bath 400/A1.5 LUA AVG Heparin 2400 Aranesp 200 / wk, Hect 5 ug, Venofer 100/ hd thru  1/28  Additional Objective Labs: Basic Metabolic Panel:  Recent Labs Lab 11/09/14 1500 11/12/14 0355 11/15/14 1135 11/16/14 0700  NA 126* 128* 129* 127*  K 3.8 5.6* 4.2 4.3  CL 91* 96 93* 91*  CO2 18* 21 25 24   GLUCOSE 164* 163* 112* 102*  BUN 101* 25* 24* 16  CREATININE 7.88* 3.95* 5.62* 5.05*  CALCIUM 8.6 8.0* 8.2* 7.9*  PHOS 9.4*  --   --  3.9   Liver Function Tests:  Recent Labs Lab 11/09/14 1500 11/16/14 0700  ALBUMIN 2.5* 2.5*   CBC:  Recent Labs Lab 11/09/14 1500 11/11/14 0917 11/12/14 0746 11/15/14 1135 11/16/14 0700  WBC 9.8 6.8 7.6 13.5* 23.1*  HGB 7.3* 6.9* 9.6* 9.2* 9.1*  HCT 21.6* 20.5* 28.9* 28.5* 28.3*  MCV 89.3 89.5 87.8 91.6 91.3  PLT 427* 413* 389 418* 437*   Blood Culture    Component Value Date/Time   SDES BLOOD RIGHT ARM 11/04/2014 1835   SPECREQUEST BOTTLES DRAWN AEROBIC AND ANAEROBIC 10CC 11/04/2014 1835   CULT  11/04/2014 1835    NO GROWTH 5 DAYS Note: Culture results may be compromised due to an excessive volume of blood received in culture bottles. Performed at [facility] 11/11/2014 FINAL 11/04/2014 1835   CBG:  Recent Labs Lab 11/14/14 2038  11/15/14 1127 11/15/14 1634 11/15/14 2049 11/16/14 0622  GLUCAP 167* 101* 137* 115* 102*  Medications:   . sodium chloride   Intravenous Once  . amLODipine  10 mg Oral QHS  . antiseptic oral rinse  7 mL Mouth Rinse BID  . aspirin  81 mg Oral Daily  . calcium acetate  2,001 mg Oral TID WC  . cinacalcet  30 mg Oral Q supper  . [START ON 11/19/2014] darbepoetin (ARANESP) injection - DIALYSIS  200 mcg Intravenous Q Sat-HD  . doxercalciferol  3 mcg Intravenous Q T,Th,Sa-HD  . ferric gluconate (FERRLECIT/NULECIT) IV  62.5 mg Intravenous Q Thu-HD  . [START ON 11/17/2014] heparin  2,400 Units Dialysis Once in dialysis  . heparin subcutaneous  5,000 Units Subcutaneous 3 times per day  . simvastatin  20 mg Oral Q M,W,F

## 2014-11-17 LAB — HEPATITIS B SURFACE ANTIBODY, QUANTITATIVE: HEPATITIS B-POST: 151.9 m[IU]/mL

## 2014-11-17 NOTE — Discharge Summary (Signed)
AMA  Patient at this time expresses desire to leave the Hospital immidiately, patient has been warned that this is not Medically advisable at this time, and can result in Medical complications like Death and Disability, patient understands and accepts the risks involved and assumes full responsibilty of this decision. I personally called and warned the husband as well.    [name] M.D on 11/17/2014 at 6:10 AM  [facility]    Last Note Below                                              Patient Demographics  [name], is a 39 y.o. female, DOB - [birth year], SMO:[id number]  Admit date - 10/31/2014   Admitting Physician Rush Farmer, MD  Outpatient Primary MD for the patient is Tommy Medal, MD  LOS - 16   Chief Complaint  Patient presents with  . Cardiac Arrest      Summary  39 yo female with extensive PMH including HTN, ESRD on HD, previous TB, DM, kidney-pancreas transplant 2010, on home O2 with witnessed arrest at home. Had her HD as regularly scheduled on 1/22. Husband performed CPR. Wide complex rhythm on EMS arrival. Total ~1 hour CPR during multiple episodes, 7 epi, 6 defib. K+ in ER was 8.1. PCCM called to admit. She was stabilized by critical care and transferred to hospitalist service, she was transferred to my service on 11/16/2014, day 16 of her hospital stay.    Subjective:   Rosetta Rupnow today has, No headache, No chest pain, No abdominal pain - No Nausea, No new weakness tingling or numbness, No Cough - SOB.    Assessment & Plan    1. Cardiac arrest. Due to hyperkalemia. Required prolonged CPR, hypothermia to to call, was seen by cardiology stabilized in ICU and then  transferred to hospitalist service. Currently no chest complaints. Potassium stable with dialysis. Echo shows an EF of 25% with moderate LVH. Further care per cardiology. On aspirin and statin.   2. ESRD. Tuesday Thursday Saturday dialysis schedule, still in volume overload being dialyzed with extra runs per renal.   3. Anemia of chronic disease. Due to ESRD. Required 2 units of packed RBC transmission on 11/11/2014. H&H stable. Continue to monitor.   4. 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 Fluid removal with dialysis. Does not make urine. Cardura G renal on board.   5. Essential hypertension. Currently on beta blocker, blood pressure stable.   6. Leukocytosis noted on 11/16/2014. White count of 23,000. Does have diarrhea, rule out C. difficile, chest x-ray nonacute,  no cough or shortness of breath, blood cultures drawn. We will monitor. She was febrile to 102 during dialysis.    Code Status: Full  Family Communication: none   Disposition Plan: TBD   Procedures    CT head 1/25: ?acute sinusitis pCXR 1/25: cardiomegaly, increasing interstitial and airspace disease EEG 1/25: Anoxic encephalopathy Echo 1/26: severely reduced EF 25-30%, moderate LVH, grade 2 DD, PA pressure 79mmHg, trivial pericardial effusion EEG 1/29: diffuse cerebral dysfunction, non-specific etiology, no seizures Echo repeat 2/2>>>normlaized EF 50%  Consults  Renal, CCS, Cards   Medications  Scheduled Meds:  Continuous Infusions: PRN Meds:.  DVT Prophylaxis    Heparin    Lab Results  Component Value Date   PLT 437* 11/16/2014    Antibiotics     Anti-infectives    Start     Dose/Rate Route Frequency Ordered Stop   11/07/14 1400  piperacillin-tazobactam (ZOSYN) IVPB 2.25 g  Status:  Discontinued     2.25 g 100 mL/hr over 30 Minutes Intravenous 3 times per day 11/07/14 1040 11/09/14 1215   11/04/14 1400  piperacillin-tazobactam (ZOSYN) IVPB 3.375 g  Status:  Discontinued      3.375 g 12.5 mL/hr over 240 Minutes Intravenous 4 times per day 11/04/14 1314 11/07/14 1040   11/03/14 0900  cefTRIAXone (ROCEPHIN) 1 g in dextrose 5 % 50 mL IVPB - Premix  Status:  Discontinued     1 g 100 mL/hr over 30 Minutes Intravenous Every 24 hours 11/03/14 0802 11/04/14 1314   11/02/14 1300  vancomycin (VANCOCIN) IVPB 750 mg/150 ml premix  Status:  Discontinued     750 mg 150 mL/hr over 60 Minutes Intravenous Every 24 hours 11/01/14 1302 11/03/14 0802   11/01/14 1800  piperacillin-tazobactam (ZOSYN) IVPB 2.25 g  Status:  Discontinued     2.25 g 100 mL/hr over 30 Minutes Intravenous 4 times per day 11/01/14 1302 11/01/14 1505   11/01/14 1515  piperacillin-tazobactam (ZOSYN) IVPB 2.25 g  Status:  Discontinued     2.25 g 100 mL/hr over 30 Minutes Intravenous Every 6 hours 11/01/14 1505 11/03/14 0802   11/01/14 1200  piperacillin-tazobactam (ZOSYN) IVPB 2.25 g  Status:  Discontinued     2.25 g 100 mL/hr over 30 Minutes Intravenous Every 8 hours 11/01/14 1111 11/01/14 1302   11/01/14 1115  vancomycin (VANCOCIN) 1,750 mg in sodium chloride 0.9 % 500 mL IVPB     1,750 mg 250 mL/hr over 120 Minutes Intravenous  Once 11/01/14 1111 11/01/14 1415   10/31/14 1130  ampicillin-sulbactam (UNASYN) 1.5 g in sodium chloride 0.9 % 50 mL IVPB  Status:  Discontinued     1.5 g 100 mL/hr over 30 Minutes Intravenous Every 12 hours 10/31/14 1115 11/01/14 1005          Objective:   Filed Vitals:   11/16/14 1008 11/16/14 1036 11/16/14 1105 11/16/14 1130  BP: 125/64 110/66 122/63 98/64  Pulse: 105 117 114 118  Temp:   102.1 F (38.9 C) 98.8 F (37.1 C)  TempSrc:   Oral Oral  Resp: 17 26 26 22   Height:      Weight:   71.3 kg (157 lb 3 oz)   SpO2:   100% 96%    Wt Readings from Last 3 Encounters:  11/16/14 71.3 kg (157 lb 3 oz)  04/23/14 78.6 kg (173 lb 4.5 oz)  03/02/14 74.3 kg (163 lb 12.8 oz)     Intake/Output Summary (Last 24 hours) at 11/17/14 7353 Last data filed at  11/16/14  1105  Gross per 24 hour  Intake      0 ml  Output   3894 ml  Net  -3894 ml     Physical Exam  Awake Alert, Oriented X 3, No new F.N deficits, Normal affect [place].AT,PERRAL Supple Neck,No JVD, No cervical lymphadenopathy appriciated.  Symmetrical Chest wall movement, Good air movement bilaterally, CTAB RRR,No Gallops,Rubs or new Murmurs, No Parasternal Heave +ve B.Sounds, Abd Soft, No tenderness, No organomegaly appriciated, No rebound - guarding or rigidity. No Cyanosis, Clubbing or edema, No new Rash or bruise, small wound R knee healing   Data Review   Micro Results Recent Results (from the past 240 hour(s))  MRSA PCR Screening     Status: None   Collection Time: 11/11/14  8:39 AM  Result Value Ref Range Status   MRSA by PCR NEGATIVE NEGATIVE Final    Comment:        The GeneXpert MRSA Assay (FDA approved for NASAL specimens only), is one component of a comprehensive MRSA colonization surveillance program. It is not intended to diagnose MRSA infection nor to guide or monitor treatment for MRSA infections.     Radiology Reports Ct Head Wo Contrast  10/31/2014   CLINICAL DATA:  Dialysis patient with recent cardiac arrest, uncontrollable bleeding  EXAM: CT HEAD WITHOUT CONTRAST  TECHNIQUE: Contiguous axial images were obtained from the base of the skull through the vertex without intravenous contrast.  COMPARISON:  None.  FINDINGS: The bony calvarium is intact. Air-fluid levels are noted within the sphenoid sinuses which may be related to acute sinusitis. Endotracheal tube and nasogastric catheter are noted. No findings to suggest acute hemorrhage, acute infarction or space-occupying mass lesion are noted.  IMPRESSION: Changes in the sphenoid sinuses which may be related to acute sinusitis. No other focal abnormality is seen.   Electronically Signed   By: Inez Catalina M.D.   On: 10/31/2014 11:19   Dg Chest Port 1 View  11/16/2014   CLINICAL DATA:  39 year old female with  shortness of breath  EXAM: PORTABLE CHEST - 1 VIEW  COMPARISON:  Prior chest x-ray 11/09/2014  FINDINGS: Cardiomegaly. Diffuse bilateral interstitial and airspace opacities more confluent in the bases. Probable small right pleural effusion. No pneumothorax. No acute osseous abnormality.  IMPRESSION: Cardiomegaly with moderate pulmonary edema and small right pleural effusion. Findings suggest CHF versus volume overload.   Electronically Signed   By: Jacqulynn Cadet M.D.   On: 11/16/2014 10:56   Dg Chest Port 1 View  11/09/2014   CLINICAL DATA:  Chest discomfort, shortness of breath. Cardiac arrest.  EXAM: PORTABLE CHEST - 1 VIEW  COMPARISON:  11/07/2014, 11/06/2014  FINDINGS: The endotracheal tube, enteric tube, and left central line have been removed. There is mild worsening of vascular congestion. Probable mild residual pulmonary edema. Bibasilar atelectasis is seen. Cardiomediastinal contours are unchanged. There is no large pleural effusion. No pneumothorax. No acute osseous abnormalities.  IMPRESSION: Mild worsening of vascular congestion. Unchanged bibasilar atelectasis.   Electronically Signed   By: Jeb Levering M.D.   On: 11/09/2014 04:52   Dg Chest Port 1 View  11/07/2014   CLINICAL DATA:  Pneumonia.  EXAM: PORTABLE CHEST - 1 VIEW  COMPARISON:  11/06/2014.  11/05/2014.  FINDINGS: Endotracheal tube and NG tube in stable position. Left IJ line in stable position. Continued improvement of bilateral pulmonary infiltrates. No pleural effusion or pneumothorax. Slight nodularity noted in both lungs most likely represents residual mild edema. Continued follow-up suggested. No  pneumothorax. No acute osseous abnormality.  IMPRESSION: 1. Lines and tubes in stable position. 2. Continued resolving congestive heart failure and pulmonary edema. Slight nodularity in noted in both lungs most likely represents mild residual edema. Continued follow-up is suggested.   Electronically Signed   By: Marcello Moores  Register   On:  11/07/2014 07:34   Dg Chest Port 1 View  11/06/2014   CLINICAL DATA:  39 year old female pulmonary edema.  EXAM: PORTABLE CHEST - 1 VIEW  COMPARISON:  Chest x-ray 11/05/2014.  FINDINGS: An endotracheal tube is in place with tip 4.5 cm above the carina. A nasogastric tube is seen extending into the stomach, however, the tip of the nasogastric tube extends below the lower margin of the image. There is a left-sided internal jugular central venous catheter with tip terminating in the left innominate vein near the confluence with the superior vena cava. There is cephalization of the pulmonary vasculature and slight indistinctness of the interstitial markings suggestive of mild pulmonary edema. Small left pleural effusion decreased in size. Previously noted right pleural effusion has resolved. Mild cardiomegaly. The patient is rotated to the left on today's exam, resulting in distortion of the mediastinal contours and reduced diagnostic sensitivity and specificity for mediastinal pathology.  IMPRESSION: 1. The appearance the chest again suggests improving congestive heart failure, as above. 2. Support apparatus, as above.   Electronically Signed   By: Vinnie Langton M.D.   On: 11/06/2014 08:19   Dg Chest Port 1 View  11/05/2014   CLINICAL DATA:  Follow-up acute respiratory failure. Initial encounter.  EXAM: PORTABLE CHEST - 1 VIEW  COMPARISON:  Chest radiograph performed 11/04/2014  FINDINGS: The patient's endotracheal tube is seen ending 3-4 cm above the carina. An enteric tube is noted extending below the diaphragm. A left IJ line is seen ending about the proximal SVC.  Bibasilar airspace opacification is relatively stable from the prior study. Underlying vascular congestion is noted. This most likely reflects pulmonary edema. Small bilateral pleural effusions are suspected. No pneumothorax is seen.  The cardiomediastinal silhouette is borderline normal in size. No acute osseous abnormalities are identified.   IMPRESSION: Relatively stable bibasilar airspace opacification, with underlying vascular congestion, likely reflecting persistent pulmonary edema. Small bilateral pleural effusions suspected.   Electronically Signed   By: Garald Balding M.D.   On: 11/05/2014 07:51   Dg Chest Port 1 View  11/04/2014   CLINICAL DATA:  Acute respiratory failure  EXAM: PORTABLE CHEST - 1 VIEW  COMPARISON:  11/03/2014  FINDINGS: Cardiac shadow is stable. Endotracheal tube is noted 3 cm above the carina. A nasogastric catheter is seen within the stomach. The left jugular line is again noted in the proximal superior vena cava. Diffuse pulmonary edema is again identified. Increasing density over the bases bilaterally suggest bilateral effusions. No bony abnormality is noted.  IMPRESSION: Persistent vascular congestion with increasing effusions bilaterally.   Electronically Signed   By: Inez Catalina M.D.   On: 11/04/2014 07:08   Dg Chest Port 1 View  11/03/2014   CLINICAL DATA:  Acute respiratory failure  EXAM: PORTABLE CHEST - 1 VIEW  COMPARISON:  Yesterday  FINDINGS: Tubular device is stable. Diffuse edema improved. Lung volumes are greater. More focal consolidation at the left base persists. Persistent central left upper lobe linear atelectasis. No pneumothorax.  IMPRESSION: Improved edema.  No pneumothorax.   Electronically Signed   By: Maryclare Bean M.D.   On: 11/03/2014 07:46   Dg Chest Port 1 View  11/02/2014  CLINICAL DATA:  Acute respiratory failure  EXAM: PORTABLE CHEST - 1 VIEW  COMPARISON:  11/01/2014  FINDINGS: Endotracheal tube tip is 2 cm above the carina. The nasogastric tube extends well into the stomach and off the inferior edge of the image. The left jugular central line tip is in the expected location of the SVC just below the azygos vein junction.  There are worsening airspace opacities bilaterally. There are small effusions bilaterally, unchanged. No pneumothorax is evident.  IMPRESSION: Worsening airspace  opacities bilaterally. Small unchanged pleural effusions.   Electronically Signed   By: Andreas Newport M.D.   On: 11/02/2014 08:35   Dg Chest Port 1 View  11/01/2014   CLINICAL DATA:  Central catheter placement  EXAM: PORTABLE CHEST - 1 VIEW  COMPARISON:  Study obtained earlier in the day  FINDINGS: Endotracheal tube tip is 2.3 cm above the carina. Left jugular catheter tip is in the superior vena cava slightly beyond the junction with the left innominate vein. Nasogastric tube tip and side port are below the diaphragm. No pneumothorax. There is moderate interstitial edema with cardiomegaly and pulmonary venous hypertension. There is airspace consolidation throughout the left lower lobe. There is a small left effusion. There is patchy airspace opacity in the right lower lobe as well.  IMPRESSION: Tube and catheter positions as described without pneumothorax. Persistent left lower lobe consolidation with left effusion. There is also patchy opacity in the right base which may represent alveolar edema. The overall appearance is consistent with congestive heart failure with questionable superimposed pneumonia in the lower lobes, particularly on the left.   Electronically Signed   By: Lowella Grip M.D.   On: 11/01/2014 15:01   Dg Chest Port 1 View  11/01/2014   CLINICAL DATA:  Acute respiratory failure, history hypertension, TB, type 2 diabetes, ovarian neoplasm, end-stage renal disease on dialysis  EXAM: PORTABLE CHEST - 1 VIEW  COMPARISON:  Portable exam 1013 hr compared to 10/31/2014  FINDINGS: Tip of endotracheal tube projects 2.7 cm above carina.  Nasogastric tube extends into stomach.  EKG leads and external pacing lead project over chest.  Borderline enlargement of cardiac silhouette.  Pulmonary vascular congestion.  Diffuse infiltrates likely pulmonary edema  Probable LEFT pleural effusion.  No pneumothorax.  IMPRESSION: Persistent pulmonary infiltrates question pulmonary edema with suspected small  LEFT pleural effusion.   Electronically Signed   By: Lavonia [name] M.D.   On: 11/01/2014 10:53   Dg Chest Port 1 View  10/31/2014   CLINICAL DATA:  OG tube placement.  EXAM: PORTABLE CHEST - 1 VIEW  COMPARISON:  One-view chest 04/18/2014.  FINDINGS: The patient has been intubated. The endotracheal tube terminates 4.9 cm above the carina. The heart is enlarged. Diffuse interstitial and airspace disease has progressed with more consolidative changes now on the right upper lobe and left lower lobe. The left hemi thorax is obscured by a to fibrillation pad. The orogastric tube courses off the inferior border of the film, within the stomach.  IMPRESSION: 1. Interval intubation. The endotracheal tube is in satisfactory position. 2. OG tube courses off the inferior border the film. 3. Cardiomegaly with increasing interstitial and airspace disease. This likely reflects a combination of edema and atelectasis. Infection or aspiration is also considered.   Electronically Signed   By: Lawrence Santiago M.D.   On: 10/31/2014 10:21     CBC  Recent Labs Lab 11/11/14 0917 11/12/14 0746 11/15/14 1135 11/16/14 0700  WBC 6.8 7.6 13.5* 23.1*  HGB 6.9* 9.6* 9.2* 9.1*  HCT 20.5* 28.9* 28.5* 28.3*  PLT 413* 389 418* 437*  MCV 89.5 87.8 91.6 91.3  MCH 30.1 29.2 29.6 29.4  MCHC 33.7 33.2 32.3 32.2  RDW 18.1* 17.9* 17.6* 18.0*    Chemistries   Recent Labs Lab 11/12/14 0355 11/15/14 1135 11/16/14 0700  NA 128* 129* 127*  K 5.6* 4.2 4.3  CL 96 93* 91*  CO2 21 25 24   GLUCOSE 163* 112* 102*  BUN 25* 24* 16  CREATININE 3.95* 5.62* 5.05*  CALCIUM 8.0* 8.2* 7.9*  MG  --  2.0  --    ------------------------------------------------------------------------------------------------------------------ estimated creatinine clearance is 14.7 mL/min (by C-G formula based on Cr of 5.05). ------------------------------------------------------------------------------------------------------------------ No results for  input(s): HGBA1C in the last 72 hours. ------------------------------------------------------------------------------------------------------------------ No results for input(s): CHOL, HDL, LDLCALC, TRIG, CHOLHDL, LDLDIRECT in the last 72 hours. ------------------------------------------------------------------------------------------------------------------ No results for input(s): TSH, T4TOTAL, T3FREE, THYROIDAB in the last 72 hours.  Invalid input(s): FREET3 ------------------------------------------------------------------------------------------------------------------ No results for input(s): VITAMINB12, FOLATE, FERRITIN, TIBC, IRON, RETICCTPCT in the last 72 hours.  Coagulation profile No results for input(s): INR, PROTIME in the last 168 hours.  No results for input(s): DDIMER in the last 72 hours.  Cardiac Enzymes No results for input(s): CKMB, TROPONINI, MYOGLOBIN in the last 168 hours.  Invalid input(s): CK ------------------------------------------------------------------------------------------------------------------ Invalid input(s): POCBNP     Time Spent in minutes   35   Lala Lund K M.D on 11/17/2014 at 6:10 AM  Between 7am to 7pm - Pager - [phone]  After 7pm go to [email or web address] - [facility] Office  [phone]

## 2014-11-22 LAB — CULTURE, BLOOD (ROUTINE X 2): Culture: NO GROWTH

## 2014-11-24 LAB — CULTURE, BLOOD (ROUTINE X 2)

## 2015-04-11 ENCOUNTER — Encounter (HOSPITAL_COMMUNITY): Payer: Self-pay

## 2015-04-11 ENCOUNTER — Emergency Department (HOSPITAL_COMMUNITY): Payer: Medicare Other

## 2015-04-11 ENCOUNTER — Inpatient Hospital Stay (HOSPITAL_COMMUNITY)
Admission: EM | Admit: 2015-04-11 | Discharge: 2015-04-13 | DRG: 291 | Discharge status: Against Medical Advice | Payer: Medicare Other | Attending: Internal Medicine | Admitting: Internal Medicine

## 2015-04-11 DIAGNOSIS — I120 Hypertensive chronic kidney disease with stage 5 chronic kidney disease or end stage renal disease: Secondary | ICD-10-CM | POA: Diagnosis present

## 2015-04-11 DIAGNOSIS — K3184 Gastroparesis: Secondary | ICD-10-CM | POA: Diagnosis present

## 2015-04-11 DIAGNOSIS — T86890 Other transplanted tissue rejection: Secondary | ICD-10-CM | POA: Diagnosis present

## 2015-04-11 DIAGNOSIS — E889 Metabolic disorder, unspecified: Secondary | ICD-10-CM | POA: Diagnosis present

## 2015-04-11 DIAGNOSIS — Z794 Long term (current) use of insulin: Secondary | ICD-10-CM | POA: Diagnosis not present

## 2015-04-11 DIAGNOSIS — Z9119 Patient's noncompliance with other medical treatment and regimen: Secondary | ICD-10-CM | POA: Diagnosis present

## 2015-04-11 DIAGNOSIS — Y830 Surgical operation with transplant of whole organ as the cause of abnormal reaction of the patient, or of later complication, without mention of misadventure at the time of the procedure: Secondary | ICD-10-CM | POA: Diagnosis present

## 2015-04-11 DIAGNOSIS — R0600 Dyspnea, unspecified: Secondary | ICD-10-CM | POA: Diagnosis present

## 2015-04-11 DIAGNOSIS — Z8543 Personal history of malignant neoplasm of ovary: Secondary | ICD-10-CM | POA: Diagnosis not present

## 2015-04-11 DIAGNOSIS — Z8611 Personal history of tuberculosis: Secondary | ICD-10-CM | POA: Diagnosis not present

## 2015-04-11 DIAGNOSIS — J9621 Acute and chronic respiratory failure with hypoxia: Secondary | ICD-10-CM | POA: Diagnosis present

## 2015-04-11 DIAGNOSIS — I5043 Acute on chronic combined systolic (congestive) and diastolic (congestive) heart failure: Principal | ICD-10-CM | POA: Diagnosis present

## 2015-04-11 DIAGNOSIS — Z940 Kidney transplant status: Secondary | ICD-10-CM

## 2015-04-11 DIAGNOSIS — J9601 Acute respiratory failure with hypoxia: Secondary | ICD-10-CM | POA: Diagnosis not present

## 2015-04-11 DIAGNOSIS — Z91148 Patient's other noncompliance with medication regimen for other reason: Secondary | ICD-10-CM | POA: Diagnosis present

## 2015-04-11 DIAGNOSIS — Z7982 Long term (current) use of aspirin: Secondary | ICD-10-CM | POA: Diagnosis not present

## 2015-04-11 DIAGNOSIS — Z9981 Dependence on supplemental oxygen: Secondary | ICD-10-CM

## 2015-04-11 DIAGNOSIS — Z9483 Pancreas transplant status: Secondary | ICD-10-CM

## 2015-04-11 DIAGNOSIS — G9340 Encephalopathy, unspecified: Secondary | ICD-10-CM | POA: Diagnosis present

## 2015-04-11 DIAGNOSIS — C569 Malignant neoplasm of unspecified ovary: Secondary | ICD-10-CM | POA: Diagnosis present

## 2015-04-11 DIAGNOSIS — I160 Hypertensive urgency: Secondary | ICD-10-CM | POA: Diagnosis present

## 2015-04-11 DIAGNOSIS — J189 Pneumonia, unspecified organism: Secondary | ICD-10-CM

## 2015-04-11 DIAGNOSIS — J9600 Acute respiratory failure, unspecified whether with hypoxia or hypercapnia: Secondary | ICD-10-CM

## 2015-04-11 DIAGNOSIS — N2581 Secondary hyperparathyroidism of renal origin: Secondary | ICD-10-CM | POA: Diagnosis present

## 2015-04-11 DIAGNOSIS — E1165 Type 2 diabetes mellitus with hyperglycemia: Secondary | ICD-10-CM | POA: Diagnosis present

## 2015-04-11 DIAGNOSIS — Z9114 Patient's other noncompliance with medication regimen: Secondary | ICD-10-CM | POA: Diagnosis present

## 2015-04-11 DIAGNOSIS — E1122 Type 2 diabetes mellitus with diabetic chronic kidney disease: Secondary | ICD-10-CM | POA: Diagnosis present

## 2015-04-11 DIAGNOSIS — Z79899 Other long term (current) drug therapy: Secondary | ICD-10-CM

## 2015-04-11 DIAGNOSIS — D631 Anemia in chronic kidney disease: Secondary | ICD-10-CM | POA: Diagnosis not present

## 2015-04-11 DIAGNOSIS — E11319 Type 2 diabetes mellitus with unspecified diabetic retinopathy without macular edema: Secondary | ICD-10-CM | POA: Diagnosis present

## 2015-04-11 DIAGNOSIS — E785 Hyperlipidemia, unspecified: Secondary | ICD-10-CM | POA: Diagnosis present

## 2015-04-11 DIAGNOSIS — E1143 Type 2 diabetes mellitus with diabetic autonomic (poly)neuropathy: Secondary | ICD-10-CM | POA: Diagnosis present

## 2015-04-11 DIAGNOSIS — E119 Type 2 diabetes mellitus without complications: Secondary | ICD-10-CM | POA: Diagnosis present

## 2015-04-11 DIAGNOSIS — N186 End stage renal disease: Secondary | ICD-10-CM

## 2015-04-11 DIAGNOSIS — G931 Anoxic brain damage, not elsewhere classified: Secondary | ICD-10-CM | POA: Diagnosis present

## 2015-04-11 DIAGNOSIS — T8612 Kidney transplant failure: Secondary | ICD-10-CM | POA: Diagnosis present

## 2015-04-11 DIAGNOSIS — Z992 Dependence on renal dialysis: Secondary | ICD-10-CM | POA: Diagnosis not present

## 2015-04-11 DIAGNOSIS — E118 Type 2 diabetes mellitus with unspecified complications: Secondary | ICD-10-CM | POA: Diagnosis present

## 2015-04-11 DIAGNOSIS — N189 Chronic kidney disease, unspecified: Secondary | ICD-10-CM

## 2015-04-11 DIAGNOSIS — A419 Sepsis, unspecified organism: Secondary | ICD-10-CM | POA: Diagnosis present

## 2015-04-11 DIAGNOSIS — M898X9 Other specified disorders of bone, unspecified site: Secondary | ICD-10-CM | POA: Diagnosis present

## 2015-04-11 DIAGNOSIS — E875 Hyperkalemia: Secondary | ICD-10-CM | POA: Diagnosis not present

## 2015-04-11 DIAGNOSIS — M9080 Osteopathy in diseases classified elsewhere, unspecified site: Secondary | ICD-10-CM

## 2015-04-11 HISTORY — DX: Acute respiratory failure, unspecified whether with hypoxia or hypercapnia: J96.00

## 2015-04-11 LAB — COMPREHENSIVE METABOLIC PANEL
ALT: 12 U/L — AB (ref 14–54)
ALT: 9 U/L — ABNORMAL LOW (ref 14–54)
AST: 28 U/L (ref 15–41)
AST: 13 U/L — AB (ref 15–41)
Albumin: 3.6 g/dL (ref 3.5–5.0)
Albumin: 3.8 g/dL (ref 3.5–5.0)
Alkaline Phosphatase: 57 U/L (ref 38–126)
Alkaline Phosphatase: 60 U/L (ref 38–126)
Anion gap: 18 — ABNORMAL HIGH (ref 5–15)
Anion gap: 14 (ref 5–15)
BUN: 76 mg/dL — ABNORMAL HIGH (ref 6–20)
BUN: 17 mg/dL (ref 6–20)
CALCIUM: 9.4 mg/dL (ref 8.9–10.3)
CALCIUM: 9.2 mg/dL (ref 8.9–10.3)
CHLORIDE: 93 mmol/L — AB (ref 101–111)
CO2: 23 mmol/L (ref 22–32)
CO2: 28 mmol/L (ref 22–32)
Chloride: 89 mmol/L — ABNORMAL LOW (ref 101–111)
Creatinine, Ser: 10.03 mg/dL — ABNORMAL HIGH (ref 0.44–1.00)
Creatinine, Ser: 4.32 mg/dL — ABNORMAL HIGH (ref 0.44–1.00)
GFR calc Af Amer: 5 mL/min — ABNORMAL LOW (ref >60)
GFR calc Af Amer: 14 mL/min — ABNORMAL LOW (ref >60)
GFR calc non Af Amer: 4 mL/min — ABNORMAL LOW (ref >60)
GFR calc non Af Amer: 12 mL/min — ABNORMAL LOW (ref >60)
GLUCOSE: 89 mg/dL (ref 65–99)
Glucose, Bld: 181 mg/dL — ABNORMAL HIGH (ref 65–99)
POTASSIUM: 4.1 mmol/L (ref 3.5–5.1)
Potassium: >7.5 mmol/L (ref 3.5–5.1)
SODIUM: 130 mmol/L — AB (ref 135–145)
SODIUM: 135 mmol/L (ref 135–145)
TOTAL PROTEIN: 7.3 g/dL (ref 6.5–8.1)
Total Bilirubin: 1 mg/dL (ref 0.3–1.2)
Total Bilirubin: 0.7 mg/dL (ref 0.3–1.2)
Total Protein: 9.3 g/dL — ABNORMAL HIGH (ref 6.5–8.1)

## 2015-04-11 LAB — CBC WITH DIFFERENTIAL/PLATELET
BASOS ABS: 0 10*3/uL (ref 0.0–0.1)
BASOS PCT: 0 % (ref 0–1)
EOS ABS: 0.1 10*3/uL (ref 0.0–0.7)
EOS PCT: 1 % (ref 0–5)
HCT: 22.2 % — ABNORMAL LOW (ref 36.0–46.0)
HEMOGLOBIN: 7.3 g/dL — AB (ref 12.0–15.0)
Lymphocytes Relative: 12 % (ref 12–46)
Lymphs Abs: 1.1 10*3/uL (ref 0.7–4.0)
MCH: 29.6 pg (ref 26.0–34.0)
MCHC: 32.9 g/dL (ref 30.0–36.0)
MCV: 89.9 fL (ref 78.0–100.0)
Monocytes Absolute: 0.4 10*3/uL (ref 0.1–1.0)
Monocytes Relative: 4 % (ref 3–12)
NEUTROS ABS: 7.7 10*3/uL (ref 1.7–7.7)
Neutrophils Relative %: 83 % — ABNORMAL HIGH (ref 43–77)
Platelets: 298 10*3/uL (ref 150–400)
RBC: 2.47 MIL/uL — ABNORMAL LOW (ref 3.87–5.11)
RDW: 15.6 % — AB (ref 11.5–15.5)
WBC: 9.2 10*3/uL (ref 4.0–10.5)

## 2015-04-11 LAB — I-STAT TROPONIN, ED: TROPONIN I, POC: 0.03 ng/mL (ref 0.00–0.08)

## 2015-04-11 LAB — CBC
HCT: 27.5 % — ABNORMAL LOW (ref 36.0–46.0)
Hemoglobin: 9 g/dL — ABNORMAL LOW (ref 12.0–15.0)
MCH: 29.7 pg (ref 26.0–34.0)
MCHC: 32.7 g/dL (ref 30.0–36.0)
MCV: 90.8 fL (ref 78.0–100.0)
PLATELETS: 227 10*3/uL (ref 150–400)
RBC: 3.03 MIL/uL — ABNORMAL LOW (ref 3.87–5.11)
RDW: 15.7 % — ABNORMAL HIGH (ref 11.5–15.5)
WBC: 12.5 10*3/uL — ABNORMAL HIGH (ref 4.0–10.5)

## 2015-04-11 LAB — RENAL FUNCTION PANEL
ALBUMIN: 4.2 g/dL (ref 3.5–5.0)
ALBUMIN: 3.6 g/dL (ref 3.5–5.0)
ANION GAP: 15 (ref 5–15)
Anion gap: 12 (ref 5–15)
BUN: <5 mg/dL — ABNORMAL LOW (ref 6–20)
BUN: 20 mg/dL (ref 6–20)
CALCIUM: 8.9 mg/dL (ref 8.9–10.3)
CO2: 30 mmol/L (ref 22–32)
CO2: 29 mmol/L (ref 22–32)
CREATININE: 4.63 mg/dL — AB (ref 0.44–1.00)
Calcium: 9.4 mg/dL (ref 8.9–10.3)
Chloride: 91 mmol/L — ABNORMAL LOW (ref 101–111)
Chloride: 92 mmol/L — ABNORMAL LOW (ref 101–111)
Creatinine, Ser: 1.02 mg/dL — ABNORMAL HIGH (ref 0.44–1.00)
GFR calc Af Amer: >60 mL/min (ref >60)
GFR calc Af Amer: 13 mL/min — ABNORMAL LOW (ref >60)
GFR calc non Af Amer: >60 mL/min (ref >60)
GFR, EST NON AFRICAN AMERICAN: 11 mL/min — AB (ref >60)
Glucose, Bld: 82 mg/dL (ref 65–99)
Glucose, Bld: 76 mg/dL (ref 65–99)
PHOSPHORUS: 6 mg/dL — AB (ref 2.5–4.6)
Potassium: 4.4 mmol/L (ref 3.5–5.1)
Sodium: 136 mmol/L (ref 135–145)
Sodium: 133 mmol/L — ABNORMAL LOW (ref 135–145)

## 2015-04-11 LAB — LACTIC ACID, PLASMA
Lactic Acid, Venous: 1 mmol/L (ref 0.5–2.0)
Lactic Acid, Venous: 0.7 mmol/L (ref 0.5–2.0)

## 2015-04-11 LAB — PROTIME-INR
INR: 1.05 (ref 0.00–1.49)
PROTHROMBIN TIME: 13.9 s (ref 11.6–15.2)

## 2015-04-11 LAB — PROCALCITONIN: Procalcitonin: 0.86 ng/mL

## 2015-04-11 LAB — MAGNESIUM: MAGNESIUM: 2.7 mg/dL — AB (ref 1.7–2.4)

## 2015-04-11 LAB — GLUCOSE, CAPILLARY
GLUCOSE-CAPILLARY: 80 mg/dL (ref 65–99)
GLUCOSE-CAPILLARY: 158 mg/dL — AB (ref 65–99)

## 2015-04-11 LAB — HEPATITIS B SURFACE ANTIGEN: HEP B S AG: NEGATIVE

## 2015-04-11 LAB — APTT: aPTT: 36 seconds (ref 24–37)

## 2015-04-11 LAB — MRSA PCR SCREENING: MRSA by PCR: NEGATIVE

## 2015-04-11 LAB — POTASSIUM

## 2015-04-11 LAB — TROPONIN I
TROPONIN I: 0.04 ng/mL — AB (ref <0.031)
Troponin I: 0.05 ng/mL — ABNORMAL HIGH (ref <0.031)

## 2015-04-11 LAB — BRAIN NATRIURETIC PEPTIDE: B Natriuretic Peptide: 832.4 pg/mL — ABNORMAL HIGH (ref 0.0–100.0)

## 2015-04-11 MED ORDER — VANCOMYCIN HCL IN DEXTROSE 1-5 GM/200ML-% IV SOLN
1000.0000 mg | Freq: Once | INTRAVENOUS | Status: DC
  Filled 2015-04-11: qty 200

## 2015-04-11 MED ORDER — PIPERACILLIN-TAZOBACTAM IN DEX 2-0.25 GM/50ML IV SOLN
2.2500 g | Freq: Three times a day (TID) | INTRAVENOUS | Status: DC
  Administered 2015-04-11 – 2015-04-12 (×3): 2.25 g via INTRAVENOUS
  Filled 2015-04-11 (×5): qty 50

## 2015-04-11 MED ORDER — SODIUM CHLORIDE 0.9 % IJ SOLN: 3.0000 mL | INTRAMUSCULAR | Status: DC | PRN

## 2015-04-11 MED ORDER — HYDRALAZINE HCL 20 MG/ML IJ SOLN
5.0000 mg | INTRAMUSCULAR | Status: DC | PRN
Start: 2015-04-11 — End: 2015-04-14
  Administered 2015-04-11: 5 mg via INTRAVENOUS
  Filled 2015-04-11: qty 1

## 2015-04-11 MED ORDER — INSULIN ASPART 100 UNIT/ML ~~LOC~~ SOLN
0.0000 [IU] | Freq: Three times a day (TID) | SUBCUTANEOUS | Status: DC
  Administered 2015-04-12 (×2): 2 [IU] via SUBCUTANEOUS

## 2015-04-11 MED ORDER — RENA-VITE PO TABS
1.0000 | ORAL_TABLET | Freq: Every day | ORAL | Status: DC
  Administered 2015-04-11 – 2015-04-13 (×3): 1 via ORAL
  Filled 2015-04-11 (×3): qty 1

## 2015-04-11 MED ORDER — HEPARIN SODIUM (PORCINE) 1000 UNIT/ML DIALYSIS: 1000.0000 [IU] | INTRAMUSCULAR | Status: DC | PRN

## 2015-04-11 MED ORDER — VANCOMYCIN HCL 10 G IV SOLR
1500.0000 mg | Freq: Once | INTRAVENOUS | Status: AC
  Administered 2015-04-11: 1500 mg via INTRAVENOUS
  Filled 2015-04-11: qty 1500

## 2015-04-11 MED ORDER — NEPRO/CARBSTEADY PO LIQD
237.0000 mL | ORAL | Status: DC | PRN
  Filled 2015-04-11: qty 237

## 2015-04-11 MED ORDER — LABETALOL HCL 300 MG PO TABS
300.0000 mg | ORAL_TABLET | Freq: Two times a day (BID) | ORAL | Status: DC
Start: 2015-04-11 — End: 2015-04-14
  Administered 2015-04-11 – 2015-04-13 (×4): 300 mg via ORAL
  Filled 2015-04-11 (×5): qty 1

## 2015-04-11 MED ORDER — ASPIRIN EC 81 MG PO TBEC
81.0000 mg | DELAYED_RELEASE_TABLET | Freq: Every day | ORAL | Status: DC
  Administered 2015-04-11 – 2015-04-13 (×3): 81 mg via ORAL
  Filled 2015-04-11 (×3): qty 1

## 2015-04-11 MED ORDER — DARBEPOETIN ALFA 200 MCG/0.4ML IJ SOSY
PREFILLED_SYRINGE | INTRAMUSCULAR | Status: AC
  Administered 2015-04-11: 200 ug via INTRAVENOUS
  Filled 2015-04-11: qty 0.4

## 2015-04-11 MED ORDER — DOXERCALCIFEROL 4 MCG/2ML IV SOLN
6.0000 ug | INTRAVENOUS | Status: DC
  Administered 2015-04-11 – 2015-04-13 (×2): 6 ug via INTRAVENOUS
  Filled 2015-04-11: qty 4

## 2015-04-11 MED ORDER — DOXERCALCIFEROL 4 MCG/2ML IV SOLN
INTRAVENOUS | Status: AC
  Administered 2015-04-11: 6 ug via INTRAVENOUS
  Filled 2015-04-11: qty 4

## 2015-04-11 MED ORDER — ESCITALOPRAM OXALATE 10 MG PO TABS
10.0000 mg | ORAL_TABLET | Freq: Every day | ORAL | Status: DC
  Administered 2015-04-11 – 2015-04-13 (×3): 10 mg via ORAL
  Filled 2015-04-11 (×3): qty 1

## 2015-04-11 MED ORDER — PIPERACILLIN-TAZOBACTAM 3.375 G IVPB 30 MIN: 3.3750 g | Freq: Three times a day (TID) | INTRAVENOUS | Status: DC

## 2015-04-11 MED ORDER — SODIUM CHLORIDE 0.9 % IV SOLN: 100.0000 mL | INTRAVENOUS | Status: DC | PRN

## 2015-04-11 MED ORDER — DARBEPOETIN ALFA 200 MCG/0.4ML IJ SOSY
200.0000 ug | PREFILLED_SYRINGE | INTRAMUSCULAR | Status: DC
  Administered 2015-04-11: 200 ug via INTRAVENOUS

## 2015-04-11 MED ORDER — ONDANSETRON HCL 4 MG PO TABS: 4.0000 mg | ORAL_TABLET | Freq: Four times a day (QID) | ORAL | Status: DC | PRN

## 2015-04-11 MED ORDER — HEPARIN SODIUM (PORCINE) 5000 UNIT/ML IJ SOLN
5000.0000 [IU] | Freq: Three times a day (TID) | INTRAMUSCULAR | Status: DC
  Administered 2015-04-11 – 2015-04-13 (×6): 5000 [IU] via SUBCUTANEOUS
  Filled 2015-04-11 (×7): qty 1

## 2015-04-11 MED ORDER — ALBUTEROL SULFATE (2.5 MG/3ML) 0.083% IN NEBU
INHALATION_SOLUTION | RESPIRATORY_TRACT | Status: AC
  Administered 2015-04-11: 2.5 mg
  Filled 2015-04-11: qty 6

## 2015-04-11 MED ORDER — CINACALCET HCL 30 MG PO TABS
30.0000 mg | ORAL_TABLET | Freq: Every day | ORAL | Status: DC
  Administered 2015-04-12 – 2015-04-13 (×2): 30 mg via ORAL
  Filled 2015-04-11 (×4): qty 1

## 2015-04-11 MED ORDER — CALCIUM ACETATE (PHOS BINDER) 667 MG PO CAPS
2001.0000 mg | ORAL_CAPSULE | Freq: Three times a day (TID) | ORAL | Status: DC
  Administered 2015-04-12 – 2015-04-13 (×4): 2001 mg via ORAL
  Filled 2015-04-11 (×8): qty 3

## 2015-04-11 MED ORDER — SODIUM CHLORIDE 0.9 % IV SOLN: 250.0000 mL | INTRAVENOUS | Status: DC | PRN

## 2015-04-11 MED ORDER — LIDOCAINE-PRILOCAINE 2.5-2.5 % EX CREA
1.0000 "application " | TOPICAL_CREAM | CUTANEOUS | Status: DC | PRN
  Filled 2015-04-11: qty 5

## 2015-04-11 MED ORDER — PIPERACILLIN-TAZOBACTAM IN DEX 2-0.25 GM/50ML IV SOLN
2.2500 g | INTRAVENOUS | Status: AC
  Administered 2015-04-11: 2.25 g via INTRAVENOUS
  Filled 2015-04-11: qty 50

## 2015-04-11 MED ORDER — CLONIDINE HCL 0.1 MG PO TABS
0.1000 mg | ORAL_TABLET | Freq: Two times a day (BID) | ORAL | Status: DC
  Administered 2015-04-11 – 2015-04-13 (×4): 0.1 mg via ORAL
  Filled 2015-04-11 (×5): qty 1

## 2015-04-11 MED ORDER — ALBUTEROL (5 MG/ML) CONTINUOUS INHALATION SOLN
10.0000 mg/h | INHALATION_SOLUTION | Freq: Once | RESPIRATORY_TRACT | Status: AC
  Administered 2015-04-11: 10 mg/h via RESPIRATORY_TRACT
  Filled 2015-04-11: qty 20

## 2015-04-11 MED ORDER — LIDOCAINE HCL (PF) 1 % IJ SOLN: 5.0000 mL | INTRAMUSCULAR | Status: DC | PRN

## 2015-04-11 MED ORDER — MORPHINE SULFATE 2 MG/ML IJ SOLN: 1.0000 mg | INTRAMUSCULAR | Status: DC | PRN

## 2015-04-11 MED ORDER — HEPARIN SODIUM (PORCINE) 1000 UNIT/ML DIALYSIS: 2000.0000 [IU] | Freq: Once | INTRAMUSCULAR | Status: DC

## 2015-04-11 MED ORDER — SODIUM CHLORIDE 0.9 % IJ SOLN
3.0000 mL | Freq: Two times a day (BID) | INTRAMUSCULAR | Status: DC
  Administered 2015-04-11 – 2015-04-13 (×6): 3 mL via INTRAVENOUS

## 2015-04-11 MED ORDER — INSULIN GLARGINE 100 UNIT/ML ~~LOC~~ SOLN
20.0000 [IU] | Freq: Every day | SUBCUTANEOUS | Status: DC
  Administered 2015-04-11 – 2015-04-13 (×3): 20 [IU] via SUBCUTANEOUS
  Filled 2015-04-11 (×3): qty 0.2

## 2015-04-11 MED ORDER — ALBUTEROL SULFATE (2.5 MG/3ML) 0.083% IN NEBU: 2.5000 mg | INHALATION_SOLUTION | RESPIRATORY_TRACT | Status: DC | PRN

## 2015-04-11 MED ORDER — ONDANSETRON HCL 4 MG/2ML IJ SOLN: 4.0000 mg | Freq: Four times a day (QID) | INTRAMUSCULAR | Status: DC | PRN

## 2015-04-11 MED ORDER — PENTAFLUOROPROP-TETRAFLUOROETH EX AERO: 1.0000 "application " | INHALATION_SPRAY | CUTANEOUS | Status: DC | PRN

## 2015-04-11 MED ORDER — MORPHINE SULFATE 2 MG/ML IJ SOLN
1.0000 mg | INTRAMUSCULAR | Status: DC | PRN
  Administered 2015-04-11: 1 mg via INTRAVENOUS
  Filled 2015-04-11: qty 1

## 2015-04-11 MED ORDER — ALTEPLASE 2 MG IJ SOLR
2.0000 mg | Freq: Once | INTRAMUSCULAR | Status: DC | PRN
  Filled 2015-04-11: qty 2

## 2015-04-11 NOTE — Progress Notes (Signed)
Pt completed HD with no complications. 6L [place], 4.6 L fluid removed. Alert, bp high post HD tx. Pt was taken to [facility]. RN and tech present and aware.

## 2015-04-11 NOTE — ED Notes (Signed)
Pt brought in from dialysis center for SOB.  Pt did not receive any dialysis today due to SOB.  Pt is on 2L of O2 at home.  Pt received 3 neb tx by EMS and lung sounds remain diminished.

## 2015-04-11 NOTE — ED Notes (Signed)
Respiratory called to come evaluate pt

## 2015-04-11 NOTE — Procedures (Signed)
I have personally attended this patient's dialysis session.   Getting emergent HD for pulmonary edema and life threatening pulmonary edema. UF goal 6 liters  0K bath for 30 minutes, then 1K bath remainder of treatment for non-hemolyzed K > 7.5  Plan additional HD tomorrow for volume and then usual treatment on Thursday.  Note: she had not missed any treatments prior to presenting today to her outpt unit with 7+kg weight goal and resp distress.  Jamal Maes, MD [place] Pager 04/11/2015, 1:14 PM

## 2015-04-11 NOTE — Progress Notes (Signed)
ANTIBIOTIC CONSULT NOTE - INITIAL  Pharmacy Consult for vanc and zosyn Indication: pneumonia  Allergies  Allergen Reactions  . Ace Inhibitors Cough    Patient Measurements:    Body Weight: 71.3 kg  Vital Signs: Temp: 98.9 F (37.2 C) (07/05 1120) Temp Source: Axillary (07/05 1120) BP: 170/85 mmHg (07/05 1200) Pulse Rate: 90 (07/05 1200) Intake/Output from previous day:   Intake/Output from this shift:    Labs:  Recent Labs  04/11/15 0815  WBC 12.5*  HGB 9.0*  PLT 227  CREATININE 10.03*   CrCl cannot be calculated (Unknown ideal weight.). No results for input(s): VANCOTROUGH, VANCOPEAK, VANCORANDOM, GENTTROUGH, GENTPEAK, GENTRANDOM, TOBRATROUGH, TOBRAPEAK, TOBRARND, AMIKACINPEAK, AMIKACINTROU, AMIKACIN in the last 72 hours.   Microbiology: No results found for this or any previous visit (from the past 720 hour(s)).  Medical History: Past Medical History  Diagnosis Date  . HTN (hypertension)     not well controlled  . 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 History of blood transfusion     "lots of times" (01/27/2013)  . CAP (community acquired pneumonia) 2010  . Type II diabetes mellitus   . Anemia   . Ovarian tumor, malignant 2005    resected in [place]' pt denies that this was cancer on 01/27/2013  . Diabetic retinopathy   . On home oxygen therapy     "5L; 24/7" (01/25/2014)  . ESRD on hemodialysis 08/25/2013    Started HD in 2008.  Had [place] transplant in 2010 at [facility].  Went back on HD in 2012.  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  The kidney was left in.  She now gets HD at [facility] on TTS schedule.     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  Both organs have since rejected, see "ESRD" for details.     Medications:  Prescriptions prior to admission  Medication Sig Dispense Refill Last  Dose  . acetaminophen (TYLENOL) 500 MG tablet Take 1,000 mg by mouth every 6 (six) hours as needed for moderate pain.   04/10/2015 at Unknown time  . amLODipine (NORVASC) 10 MG tablet Take 10 mg by mouth at bedtime.    04/10/2015 at Unknown time  . aspirin EC 81 MG tablet Take 81 mg by mouth at bedtime.    04/10/2015 at Unknown time  . B Complex-C-Folic Acid (RENA-VITE PO) Take 1 tablet by mouth daily.   04/10/2015 at Unknown time  . calcium acetate (PHOSLO) 667 MG capsule Take 3 capsules (2,001 mg total) by mouth 3 (three) times daily with meals.   04/10/2015 at Unknown time  . cinacalcet (SENSIPAR) 30 MG tablet Take 30 mg by mouth daily.   04/10/2015 at Unknown time  . cloNIDine (CATAPRES) 0.1 MG tablet Take 0.1 mg by mouth 2 (two) times daily.    04/10/2015 at Unknown time  . diphenhydrAMINE (BENADRYL) 25 mg capsule Take 25 mg by mouth daily as needed for allergies.   Past Week at Unknown time  . escitalopram (LEXAPRO) 20 MG tablet Take 10 mg by mouth daily.    04/10/2015 at Unknown time  . insulin aspart (NOVOLOG) 100 UNIT/ML injection Inject 12 Units into the skin 2 (two) times daily with breakfast and lunch.    04/10/2015 at Unknown time  . insulin glargine (LANTUS) 100 UNIT/ML injection Inject 0.5  mLs (50 Units total) into the skin at bedtime. 10 mL 11 04/10/2015 at Unknown time  . labetalol (NORMODYNE) 300 MG tablet Take 300 mg by mouth 2 (two) times daily.   04/10/2015 at Unknown time  . metoCLOPramide (REGLAN) 5 MG tablet Take 5 mg by mouth 4 (four) times daily.   04/10/2015 at Unknown time  . oxyCODONE-acetaminophen (PERCOCET/ROXICET) 5-325 MG per tablet Take 1 tablet by mouth every 4 (four) hours as needed for moderate pain. 30 tablet 0 04/10/2015 at Unknown time  . pantoprazole (PROTONIX) 40 MG tablet Take 40 mg by mouth daily.   04/10/2015 at Unknown time  . simvastatin (ZOCOR) 20 MG tablet Take 20 mg by mouth every Monday, Wednesday, and Friday.   04/10/2015 at Unknown time   Assessment: 39 yo lady to start  empiric vanc and zosyn for PNA, r/o sepsis  Goal of Therapy:  Pre HD vanc level 20-25 mg/L  Plan:  Zosyn 2.25 gm IV q8 hours Vanc 1500 mg IV X 1. Will f/u further dosing based on HD schedule  Thanks for allowing pharmacy to be a part of this patient's care.  Excell Seltzer, PharmD Clinical Pharmacist, [phone]  04/11/2015,12:13 PM

## 2015-04-11 NOTE — Progress Notes (Addendum)
CRITICAL VALU2E ALERT  Critical value received:  Potassium <2.0 and Phosphorus <1.0  Date of notification:  04/11/2015  Time of notification:  3567  Critical value read back:Yes.    Nurse who received alert:  Rico Sheehan RN  MD notified (1st page):  Dr Doyle Askew, I  Asked to have BMP repeated prior to supplementation to ensure no blood work error.  7:39 pm follow up BMP with K 4.1, no need to supplement.  Faye Ramsay, MD  [facility] Pager [phone]  If 7PM-7AM, please contact night-coverage [email or web address] Password [email or web address]     Time MD responded:  1800

## 2015-04-11 NOTE — ED Notes (Signed)
Lockwood MD at bedside. 

## 2015-04-11 NOTE — Consult Note (Signed)
PULMONARY / CRITICAL CARE MEDICINE   Name: [name] MRN: [id number] DOB: [birth year]    ADMISSION DATE:  04/11/2015 CONSULTATION DATE:  04/11/15  REFERRING MD :  Dr. 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  Work up concerning for volume overload / HTN.   STUDIES:  7/05  PCXR >> bilateral pleural effusions, R>L  SIGNIFICANT EVENTS: 7/05  Admit with SOB, suspected volume overload.  PCCM consulted for resp distress   HISTORY OF PRESENT ILLNESS:  39 y/o F with PMH of HTN, TB, DM II (on insulin) with diabetic retinopathy, anemia, 2L oxygen dependent, ESRD on HD (T/Th/S), cardiac arrest (11/2014), and renal/pancreatic transplant (failed, off suppression) who presented to [facility] on 7/5 with a 24 hour history of worsening shortness of breath.    The patient went to dialysis as scheduled but was unable to receive HD due to worsening SOB and was sent to the ER for evaluation.  She was treated with nebulized albuterol by EMS without improvement.  On arrival, she was noted to be in respiratory distress and was placed on bipap.  Dyspnea improved with bipap.  Initial CXR was concerning for bilateral pleural effusions, R>L and cardiomegaly.  Labs - na 130, K >7.5, cl 89, Cr 10.89, glucose 181, mag 2.7, BNP 832, troponin 0.03, WBC 12.5, Hgb 9.0, & platelets 227.    The patient denies chest pain, nausea / vomiting, diarrhea, abdominal pain, cough, sputum production.  She reports increasing shortness of breath over the past 24 hours.  Last HD was 3 days prior to presentation and she denies missing HD.  Patient was hypertensive in ER with initial BP of 192/80, HR 90's.  PCCM consulted for concerns of respiratory distress and possible sepsis.   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  has past surgical history that includes Tumor removal (2005); AV fistula placement (06/03/2012); Eye surgery (Right); Combined kidney-pancreas transplant (2010); Pancreatectomy (12/13/2010); Renal biopsy (12/13/2010; 10/25/2011); and TEE without cardioversion (N/A, 04/22/2014).   Prior to Admission medications   Medication Sig Start Date End Date Taking? Authorizing Provider  acetaminophen (TYLENOL) 500 MG tablet Take 1,000 mg by mouth every 6 (six) hours as needed for moderate pain.   Yes Historical Provider, MD  amLODipine (NORVASC) 10 MG tablet Take 10 mg by mouth at bedtime.    Yes Historical Provider, MD  aspirin EC 81 MG tablet Take 81 mg by mouth at bedtime.    Yes Historical Provider, MD  B Complex-C-Folic Acid (RENA-VITE PO) Take 1 tablet by mouth daily.   Yes Historical Provider, MD  calcium acetate (PHOSLO) 667 MG capsule Take 3 capsules (2,001 mg total) by mouth 3 (three) times daily with meals. 01/28/13  Yes Alric Seton, PA-C  cinacalcet (SENSIPAR) 30 MG tablet Take 30 mg by mouth daily.   Yes Historical Provider, MD  cloNIDine (CATAPRES) 0.1 MG tablet Take 0.1 mg by mouth 2 (two) times daily.    Yes Historical Provider, MD  diphenhydrAMINE (BENADRYL) 25 mg capsule  Take 25 mg by mouth daily as needed for allergies.   Yes Historical Provider, MD  escitalopram (LEXAPRO) 20 MG tablet Take 10 mg by mouth daily.    Yes Historical Provider, MD  insulin aspart (NOVOLOG) 100 UNIT/ML injection Inject 12 Units into the skin 2 (two) times daily with breakfast and lunch.    Yes Historical Provider, MD  insulin glargine (LANTUS) 100 UNIT/ML injection Inject 0.5 mLs (50 Units total) into  the skin at bedtime. 04/23/14  Yes Delfina Redwood, MD  labetalol (NORMODYNE) 300 MG tablet Take 300 mg by mouth 2 (two) times daily.   Yes Historical Provider, MD  metoCLOPramide (REGLAN) 5 MG tablet Take 5 mg by mouth 4 (four) times daily.   Yes Historical Provider, MD  oxyCODONE-acetaminophen (PERCOCET/ROXICET) 5-325 MG per tablet Take 1 tablet by mouth every 4 (four) hours as needed for moderate pain. 11/15/14  Yes Donne Hazel, MD  pantoprazole (PROTONIX) 40 MG tablet Take 40 mg by mouth daily.   Yes Historical Provider, MD  simvastatin (ZOCOR) 20 MG tablet Take 20 mg by mouth every Monday, Wednesday, and Friday.   Yes Historical Provider, MD   Allergies  Allergen Reactions  . Ace Inhibitors Cough    FAMILY HISTORY:  indicated that her mother is alive. She indicated that her father is deceased.    SOCIAL HISTORY:  reports that she has never smoked. She has never used smokeless tobacco. She reports that she does not drink alcohol or use illicit drugs.  REVIEW OF SYSTEMS:  Pt is on bipap for respiratory distress, see HPI.   Gen: Denies fever, chills, weight change, fatigue, night sweats HEENT: Denies blurred vision, double vision, hearing loss, tinnitus, sinus congestion, rhinorrhea, sore throat, neck stiffness, dysphagia PULM: Denies cough, sputum production, hemoptysis, wheezing.  Reports SOB x 24 hours CV: Denies chest pain, edema, orthopnea, paroxysmal nocturnal dyspnea, palpitations GI: Denies abdominal pain, nausea, vomiting, diarrhea, hematochezia, melena, constipation, change in bowel habits GU: Denies dysuria, hematuria, polyuria, oliguria, urethral discharge Endocrine: Denies hot or cold intolerance, polyuria, polyphagia or appetite change Derm: Denies rash, dry skin, scaling or peeling skin change Heme: Denies easy bruising, bleeding, bleeding gums Neuro: Denies headache, numbness, weakness, slurred speech, loss of memory or consciousness   SUBJECTIVE:   VITAL  SIGNS: Temp:  [99.1 F (37.3 C)] 99.1 F (37.3 C) (07/05 1057) Pulse Rate:  [92-107] 93 (07/05 1045) Resp:  [18-34] 23 (07/05 1045) BP: (167-194)/(73-93) 167/87 mmHg (07/05 1045) SpO2:  [96 %-100 %] 100 % (07/05 1045) FiO2 (%):  [40 %] 40 % (07/05 1020)   HEMODYNAMICS:     VENTILATOR SETTINGS: Vent Mode:  [-]  FiO2 (%):  [40 %] 40 %   INTAKE / OUTPUT: No intake or output data in the 24 hours ending 04/11/15 1122  PHYSICAL EXAMINATION: General:  Chronically ill adult female in NAD on biapap Neuro:  AAOx4, answers appropriately, MAE  HEENT:  MM pink/moist, JVD+ Cardiovascular:  s1s2 rrr, no m/r/g Lungs:  resp's even/non-labored, lungs bilaterally diminished posteriorly R>L Abdomen:  NTND, BSx4 active  Musculoskeletal:  No acute deformities  Skin:  Warm/dry, no edema, scattered LE abrasions  LABS:  CBC  Recent Labs Lab 04/11/15 0815  WBC 12.5*  HGB 9.0*  HCT 27.5*  PLT 227   Coag's No results for input(s): APTT, INR in the last 168 hours.   BMET  Recent Labs Lab 04/11/15 0815 04/11/15 0950  NA 130*  --   K >7.5* >7.5*  CL  89*  --   CO2 23  --   BUN 76*  --   CREATININE 10.03*  --   GLUCOSE 181*  --    Electrolytes  Recent Labs Lab 04/11/15 0815  CALCIUM 9.4  MG 2.7*   Sepsis Markers No results for input(s): LATICACIDVEN, PROCALCITON, O2SATVEN in the last 168 hours.   ABG No results for input(s): PHART, PCO2ART, PO2ART in the last 168 hours.   Liver Enzymes  Recent Labs Lab 04/11/15 0815  AST 28  ALT 12*  ALKPHOS 57  BILITOT 1.0  ALBUMIN 3.6   Cardiac Enzymes No results for input(s): TROPONINI, PROBNP in the last 168 hours.   Glucose No results for input(s): GLUCAP in the last 168 hours.  Imaging Dg Chest Port 1 View  04/11/2015   CLINICAL DATA:  Progressive shortness of breath. History of ovarian carcinoma.  EXAM: PORTABLE CHEST - 1 VIEW  COMPARISON:  November 16, 2014  FINDINGS: There is diffuse interstitial and patchy alveolar  edema. There are bilateral effusions with cardiomegaly and pulmonary venous hypertension. No adenopathy is appreciable. No bone lesions.  IMPRESSION: Findings consistent with congestive heart failure. Superimposed pneumonia cannot be excluded radiographically.   Electronically Signed   By: Lowella Grip III M.D.   On: 04/11/2015 08:23     ASSESSMENT / PLAN:  PULMONARY OETT A: Acute Respiratory Distress - in setting of volume overload Bilateral Pleural Effusions, R>L  Oxygen Dependent at Baseline, 2L - ? Unclear reasons P:   BiPAP as needed for increased WOB Monitor respiratory status closely Intermittent CXR  O2 to support sats > 90% Consider pleural assessment with [place] if effusion not improved with HD   CARDIOVASCULAR CVL A:  Hypertensive Urgency  P:  Continue ASA, clonidine, labetalol  PRN hydralazine  Tele monitoring  Assess EKG, troponin  RENAL A:   ESRD - on HD  Hyperkalemia Hx Failed Renal Transplant  P:   Trend BMP  Emergent HD for hyperkalemia HD per Nephrology  Replace electrolytes as needed  GASTROINTESTINAL A:   No acute issues  P:   NPO for now, ok for diet once off BiPAP & respiratory distress resolved. PRN zofran   HEMATOLOGIC A:   Anemia of Chronic Disease  P:  Trend CBC  DVT Prop:  Heparin sq  INFECTIOUS A:   R/O Sepsis - leukocytosis P:   BCx2 7/5 >>  UC 7/5 >>  U. Strep 7/5 >>  U. Legionella 7/5 >>   Vanco, start date 7/5, day 1/x Zosyn, start date 7/5, day 1/x  Assess PCT, lactic acid Hopeful to narrow abx quickly   ENDOCRINE A:   DM II   P:   SSI   NEUROLOGIC A:   No acute issues  P:   Monitor neuro status    FAMILY  - Updates: Patient updated at bedside.       Noe Gens, NP-C [facility] Pgr: [phone] or if no answer [phone] 04/11/2015, 11:22 AM

## 2015-04-11 NOTE — Progress Notes (Signed)
04/11/2015 Patient came through [facility] to Hemodialysis and then came to floor after Dialysis. When arrive in patient room she was asleep, but is arousable. Patient have scab on bilateral legs, on right knee scab and right foot the great toe nail was removed by patient. Left upper arm Fistula. CHG bath was done, place on telemetry and bed alarm was turned on. [facility] RN.

## 2015-04-11 NOTE — Care Management Note (Signed)
Case Management Note  Patient Details  Name: [name] MRN: [id number] Date of Birth: 1976/03/28  Subjective/Objective:     Adm w resp failure               Action/Plan: pt lives w fam, pcp dr Delfino Lovett pang   Expected Discharge Date:                  Expected Discharge Plan:     In-House Referral:     Discharge planning Services     Post Acute Care Choice:    Choice offered to:     DME Arranged:    DME Agency:     HH Arranged:    [place] Agency:     Status of Service:     Medicare Important Message Given:    Date Medicare IM Given:    Medicare IM give by:    Date Additional Medicare IM Given:    Additional Medicare Important Message give by:     If discussed at [place] of Stay Meetings, dates discussed:    Additional Comments: pt on hd-esrd, chart states has home o2, ur review done  Lacretia Leigh, RN 04/11/2015, 3:22 PM

## 2015-04-11 NOTE — ED Provider Notes (Addendum)
CSN: [id number]     Arrival date & time 04/11/15  0754 History   First MD Initiated Contact with Patient 04/11/15 [phone]     Chief Complaint  Patient presents with  . Respiratory Distress     (Consider location/radiation/quality/duration/timing/severity/associated sxs/prior Treatment) HPI Patient presents in respiratory distress from dialysis. Patient notes that she feels dyspneic, onset was several days ago. Patient uses oxygen at home, typically 2 L. Over the interval days, patient has had increasing dyspnea. Today at dialysis the patient was dyspneic, did not improve in spite of nebulizer therapy, was sent here for evaluation. Last dialysis 3 days ago. Patient not his multiple medical problems, including dialysis, hypertension, home oxygen requirement. Patient is status post kidney, pancreas transplant. She denies pain, confusion, dislocation, fever. Patient had 3 nebulizer treatments provided by EMS. No improvement.  Past Medical History  Diagnosis Date  . HTN (hypertension)     not well controlled  . 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 History of blood transfusion     "lots of times" (01/27/2013)  . CAP (community acquired pneumonia) 2010  . Type II diabetes mellitus   . Anemia   . Ovarian tumor, malignant 2005    resected in [place]' pt denies that this was cancer on 01/27/2013  . Diabetic retinopathy   . On home oxygen therapy     "5L; 24/7" (01/25/2014)  . ESRD on hemodialysis 08/25/2013    Started HD in 2008.  Had [place] transplant in 2010 at [facility].  Went back on HD in 2012.  Pancreas was removed surgically at [facility] for rejection w abcess formation.  The kidney was left in.  She now gets HD at [facility] on TTS schedule.     [name] History of simultaneous kidney and pancreas transplant 04/19/2014    In 2010 at [facility].  Both organs have since rejected, see "ESRD" for details.    Past Surgical  History  Procedure Laterality Date  . Tumor removal  2005    Ovarian, resected in [place]  . Av fistula placement  06/03/2012    Procedure: INSERTION OF ARTERIOVENOUS (AV) GORE-TEX GRAFT ARM;  Surgeon: Rosetta Posner, MD;  Location: [place];  Service: Vascular;  Laterality: Left;  [name] Eye surgery Right     "cause of diabetes" (4/23/20140  . Combined kidney-pancreas transplant  2010    /notes 01/27/2013; @ [facility]  . 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 Renal biopsy  12/13/2010; 10/25/2011    Archie Endo 01/27/2013  . Tee without cardioversion N/A 04/22/2014    Procedure: TRANSESOPHAGEAL ECHOCARDIOGRAM (TEE);  Surgeon: Fay Records, MD;  Location: [facility] ENDOSCOPY;  Service: Cardiovascular;  Laterality: N/AHinton Dyer   Family History  Problem Relation Age of Onset  . Diabetes Mother   . Liver cancer Father   . Cancer Father    History  Substance Use Topics  . Smoking status: Never Smoker   . Smokeless tobacco: Never Used  . Alcohol Use: No   OB History    No data available     Review of Systems  Unable to perform ROS: Severe respiratory distress      Allergies  Ace inhibitors  Home Medications   Prior to Admission medications   Medication Sig Start Date End Date Taking? Authorizing Provider  acetaminophen (TYLENOL) 500 MG tablet Take  1,000 mg by mouth every 6 (six) hours as needed for moderate pain.    Historical Provider, MD  amLODipine (NORVASC) 10 MG tablet Take 10 mg by mouth at bedtime.     Historical Provider, MD  aspirin EC 81 MG tablet Take 81 mg by mouth at bedtime.     Historical Provider, MD  B Complex-C-Folic Acid (RENA-VITE PO) Take 1 tablet by mouth daily.    Historical Provider, MD  calcium acetate (PHOSLO) 667 MG capsule Take 3 capsules (2,001 mg total) by mouth 3 (three) times daily with meals. 01/28/13   Alric Seton, PA-C  cinacalcet (SENSIPAR) 30 MG tablet Take 30 mg by mouth daily.    Historical  Provider, MD  cloNIDine (CATAPRES) 0.1 MG tablet Take 0.1 mg by mouth 2 (two) times daily.     Historical Provider, MD  diphenhydrAMINE (BENADRYL) 25 mg capsule Take 25 mg by mouth daily as needed for allergies.    Historical Provider, MD  escitalopram (LEXAPRO) 20 MG tablet Take 10 mg by mouth daily.     Historical Provider, MD  insulin aspart (NOVOLOG) 100 UNIT/ML injection Inject 12 Units into the skin 2 (two) times daily with breakfast and lunch.     Historical Provider, MD  insulin glargine (LANTUS) 100 UNIT/ML injection Inject 0.5 mLs (50 Units total) into the skin at bedtime. 04/23/14   Delfina Redwood, MD  labetalol (NORMODYNE) 300 MG tablet Take 300 mg by mouth 2 (two) times daily.    Historical Provider, MD  metoCLOPramide (REGLAN) 5 MG tablet Take 5 mg by mouth 4 (four) times daily.    Historical Provider, MD  oxyCODONE-acetaminophen (PERCOCET/ROXICET) 5-325 MG per tablet Take 1 tablet by mouth every 4 (four) hours as needed for moderate pain. 11/15/14   Donne Hazel, MD  pantoprazole (PROTONIX) 40 MG tablet Take 40 mg by mouth daily.    Historical Provider, MD  simvastatin (ZOCOR) 20 MG tablet Take 20 mg by mouth every Monday, Wednesday, and Friday.    Historical Provider, MD   SpO2 99%  LMP  (LMP Unknown) Physical Exam  Constitutional: She is oriented to person, place, and time. She appears well-developed and well-nourished. She appears distressed.  Very uncomfortable appearing diaphoretic female sitting upright, receiving supplemental oxygen  HENT:  Head: Normocephalic and atraumatic.  Eyes: Conjunctivae and EOM are normal.  Neck: No thyromegaly present.  Cardiovascular: Normal rate and regular rhythm.   Pulmonary/Chest: Accessory muscle usage present. No stridor. Tachypnea noted. She is in respiratory distress. She has decreased breath sounds. She has wheezes.  Abdominal: She exhibits no distension.  Musculoskeletal: She exhibits no edema.       Arms: Neurological: She is  alert and oriented to person, place, and time. No cranial nerve deficit.  Skin: Skin is warm. She is diaphoretic.  Psychiatric: She has a normal mood and affect.  Nursing note and vitals reviewed.   ED Course  Procedures (including critical care time) Labs Review Labs Reviewed  CBC - Abnormal; Notable for the following:    WBC 12.5 (*)    RBC 3.03 (*)    Hemoglobin 9.0 (*)    HCT 27.5 (*)    RDW 15.7 (*)    All other components within normal limits  COMPREHENSIVE METABOLIC PANEL - Abnormal; Notable for the following:    Sodium 130 (*)    Potassium >7.5 (*)    Chloride 89 (*)    Glucose, Bld 181 (*)    BUN 76 (*)  Creatinine, Ser 10.03 (*)    ALT 12 (*)    GFR calc non Af Amer 4 (*)    GFR calc Af Amer 5 (*)    Anion gap 18 (*)    All other components within normal limits  MAGNESIUM - Abnormal; Notable for the following:    Magnesium 2.7 (*)    All other components within normal limits  BRAIN NATRIURETIC PEPTIDE - Abnormal; Notable for the following:    B Natriuretic Peptide 832.4 (*)    All other components within normal limits  POTASSIUM  Randolm Idol, ED    Imaging Review Dg Chest Port 1 View  04/11/2015   CLINICAL DATA:  Progressive shortness of breath. History of ovarian carcinoma.  EXAM: PORTABLE CHEST - 1 VIEW  COMPARISON:  November 16, 2014  FINDINGS: There is diffuse interstitial and patchy alveolar edema. There are bilateral effusions with cardiomegaly and pulmonary venous hypertension. No adenopathy is appreciable. No bone lesions.  IMPRESSION: Findings consistent with congestive heart failure. Superimposed pneumonia cannot be excluded radiographically.   Electronically Signed   By: Lowella Grip III M.D.   On: 04/11/2015 08:23     EKG Interpretation   Date/Time:  Tuesday April 11 2015 08:03:17 EDT Ventricular Rate:  99 PR Interval:  150 QRS Duration: 137 QT Interval:  396 QTC Calculation: 508 R Axis:   76 Text Interpretation:  Sinus or ectopic  atrial rhythm Probable left atrial  enlargement IVCD, consider atypical RBBB Left ventricular hypertrophy  Abnormal T, consider ischemia, lateral leads ST elev, probable normal  early repol pattern Sinus rhythm Non-specific intra-ventricular conduction  delay t wave peaked ST-t wave abnormality Abnormal ekg Confirmed by  Carmin Muskrat  MD ([id number]) on 04/11/2015 8:13:29 AM     Pulse ox 97% on supplemental oxygen abnormal Heart105 sinus tach, irregular abnormal  After additional nebulizer treatment, patient continues to wheeze. Patient was started on BiPAP.   9:35 AM Patient with decreased respiratory effort, on BiPAP. Initial labs hemolyzed but concern for hyperkalemia, and given her elevated BNP, x-ray evidence for congestive heart failure progression, nephrology has been consult,   I reviewed the patient's x-ray, agree with the interpretation.  Chart review demonstrates the patient had emergency dialysis 5 months ago. Patient had hyperkalemia, cardiac arrest at that time.   9:50 AM Patient tolerating BiPAP, requests that it stays on. I discussed patient's case with our nephrology colleagues, the patient will be taken for dialysis.    MDM  Patient with a history of failed kidney, pancreas transplant, as well as multiple other medical conditions, including congestive heart failure now presents in respiratory distress from missed dialysis session. Session missed secondary to her clinical condition. On exam the patient is tachypneic, tachycardic, with audible wheezing in spite of multiple nebulizer treatments. Here patient continued to receive bronchodilation, as well as BiPAP. Patient's labs notable for hyperkalemia, x-ray suggests fluid overload status as well. After resuscitation with noninvasive ventilatory support, and some clinical improvement, patient required emergent dialysis.  CRITICAL CARE Performed by: Carmin Muskrat Total critical care time: 40 Critical care time  was exclusive of separately billable procedures and treating other patients. Critical care was necessary to treat or prevent imminent or life-threatening deterioration. Critical care was time spent personally by me on the following activities: development of treatment plan with patient and/or surrogate as well as nursing, discussions with consultants, evaluation of patient's response to treatment, examination of patient, obtaining history from patient or surrogate, ordering and performing treatments and interventions,  ordering and review of laboratory studies, ordering and review of radiographic studies, pulse oximetry and re-evaluation of patient's condition.   Carmin Muskrat, MD 04/11/15 [phone]  Though the patient was protecting her airway throughout her ED stay, given her Hx (including cardiac arrest) and concern for fluid status, I discussed her case w Critical Care as well.  Carmin Muskrat, MD 04/11/15 1037

## 2015-04-11 NOTE — Consult Note (Signed)
Indication for Consultation:  Management of ESRD/hemodialysis; anemia, hypertension/volume and secondary hyperparathyroidism  HPI: [name] is a 39 y.o. female who was sent to the ED from her HD center this am for evaluation of SOB, dyspnea- her goal today was 7.6L. She receives HD TTS AF, history of HTN, failed kidney/panc transplant DM and ovarian cancer. She has chronic high fluid gains- admits to drinking a lot of fluid this weekend. She presented to HD this AM and was SOB, and very restless, she was canulated but needles dislodged while pt moving around. EMS was called and she received neb treatment, she had some improvement but was still restless and unable to sit for outpt HD so she was sent to the ED. Will arrange HD now and continue while admitted.  Past Medical History  Diagnosis Date  . HTN (hypertension)     not well controlled  . 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 History of blood transfusion     "lots of times" (01/27/2013)  . CAP (community acquired pneumonia) 2010  . Type II diabetes mellitus   . Anemia   . Ovarian tumor, malignant 2005    resected in [place]' pt denies that this was cancer on 01/27/2013  . Diabetic retinopathy   . On home oxygen therapy     "5L; 24/7" (01/25/2014)  . ESRD on hemodialysis 08/25/2013    Started HD in 2008.  Had [place] transplant in 2010 at [facility].  Went back on HD in 2012.  Pancreas was removed surgically at [facility] for rejection w abcess formation.  The kidney was left in.  She now gets HD at [facility] on TTS schedule.     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  Both organs have since rejected, see "ESRD" for details.    Past Surgical History  Procedure Laterality Date  . Tumor removal  2005    Ovarian, resected in [place]  . Av fistula placement  06/03/2012    Procedure: INSERTION OF ARTERIOVENOUS (AV) GORE-TEX  GRAFT ARM;  Surgeon: Rosetta Posner, MD;  Location: [facility];  Service: Vascular;  Laterality: Left;  [name] Eye surgery Right     "cause of diabetes" (4/23/20140  . Combined kidney-pancreas transplant  2010    /notes 01/27/2013; @ [facility]  . 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 Renal biopsy  12/13/2010; 10/25/2011    Archie Endo 01/27/2013  . Tee without cardioversion N/A 04/22/2014    Procedure: TRANSESOPHAGEAL ECHOCARDIOGRAM (TEE);  Surgeon: Fay Records, MD;  Location: [facility] ENDOSCOPY;  Service: Cardiovascular;  Laterality: N/AHinton Dyer   Family History  Problem Relation Age of Onset  . Diabetes Mother   . Liver cancer Father   . Cancer Father    Social History:  reports that she has never smoked. She has never used smokeless tobacco. She reports that she does not drink alcohol or use illicit drugs. Allergies  Allergen Reactions  . Ace Inhibitors Cough   Prior to Admission medications   Medication Sig Start Date End Date Taking? Authorizing Provider  acetaminophen (TYLENOL) 500 MG tablet Take 1,000 mg by mouth every 6 (six) hours as needed for moderate pain.   Yes Historical Provider, MD  amLODipine (NORVASC) 10 MG tablet Take 10 mg by mouth at bedtime.  Yes Historical Provider, MD  aspirin EC 81 MG tablet Take 81 mg by mouth at bedtime.    Yes Historical Provider, MD  B Complex-C-Folic Acid (RENA-VITE PO) Take 1 tablet by mouth daily.   Yes Historical Provider, MD  calcium acetate (PHOSLO) 667 MG capsule Take 3 capsules (2,001 mg total) by mouth 3 (three) times daily with meals. 01/28/13  Yes Alric Seton, PA-C  cinacalcet (SENSIPAR) 30 MG tablet Take 30 mg by mouth daily.   Yes Historical Provider, MD  cloNIDine (CATAPRES) 0.1 MG tablet Take 0.1 mg by mouth 2 (two) times daily.    Yes Historical Provider, MD  diphenhydrAMINE (BENADRYL) 25 mg capsule Take 25 mg by mouth daily as needed for allergies.   Yes Historical Provider, MD   escitalopram (LEXAPRO) 20 MG tablet Take 10 mg by mouth daily.    Yes Historical Provider, MD  insulin aspart (NOVOLOG) 100 UNIT/ML injection Inject 12 Units into the skin 2 (two) times daily with breakfast and lunch.    Yes Historical Provider, MD  insulin glargine (LANTUS) 100 UNIT/ML injection Inject 0.5 mLs (50 Units total) into the skin at bedtime. 04/23/14  Yes Delfina Redwood, MD  labetalol (NORMODYNE) 300 MG tablet Take 300 mg by mouth 2 (two) times daily.   Yes Historical Provider, MD  metoCLOPramide (REGLAN) 5 MG tablet Take 5 mg by mouth 4 (four) times daily.   Yes Historical Provider, MD  oxyCODONE-acetaminophen (PERCOCET/ROXICET) 5-325 MG per tablet Take 1 tablet by mouth every 4 (four) hours as needed for moderate pain. 11/15/14  Yes Donne Hazel, MD  pantoprazole (PROTONIX) 40 MG tablet Take 40 mg by mouth daily.   Yes Historical Provider, MD  simvastatin (ZOCOR) 20 MG tablet Take 20 mg by mouth every Monday, Wednesday, and Friday.   Yes Historical Provider, MD   No current facility-administered medications for this encounter.   Current Outpatient Prescriptions  Medication Sig Dispense Refill  . acetaminophen (TYLENOL) 500 MG tablet Take 1,000 mg by mouth every 6 (six) hours as needed for moderate pain.    [name] amLODipine (NORVASC) 10 MG tablet Take 10 mg by mouth at bedtime.     [name] aspirin EC 81 MG tablet Take 81 mg by mouth at bedtime.     . B Complex-C-Folic Acid (RENA-VITE PO) Take 1 tablet by mouth daily.    . calcium acetate (PHOSLO) 667 MG capsule Take 3 capsules (2,001 mg total) by mouth 3 (three) times daily with meals.    . cinacalcet (SENSIPAR) 30 MG tablet Take 30 mg by mouth daily.    . cloNIDine (CATAPRES) 0.1 MG tablet Take 0.1 mg by mouth 2 (two) times daily.     . diphenhydrAMINE (BENADRYL) 25 mg capsule Take 25 mg by mouth daily as needed for allergies.    [name] escitalopram (LEXAPRO) 20 MG tablet Take 10 mg by mouth daily.     . insulin aspart (NOVOLOG) 100 UNIT/ML  injection Inject 12 Units into the skin 2 (two) times daily with breakfast and lunch.     . insulin glargine (LANTUS) 100 UNIT/ML injection Inject 0.5 mLs (50 Units total) into the skin at bedtime. 10 mL 11  . labetalol (NORMODYNE) 300 MG tablet Take 300 mg by mouth 2 (two) times daily.    . metoCLOPramide (REGLAN) 5 MG tablet Take 5 mg by mouth 4 (four) times daily.    [name] oxyCODONE-acetaminophen (PERCOCET/ROXICET) 5-325 MG per tablet Take 1 tablet by mouth every 4 (four) hours as  needed for moderate pain. 30 tablet 0  . pantoprazole (PROTONIX) 40 MG tablet Take 40 mg by mouth daily.    . simvastatin (ZOCOR) 20 MG tablet Take 20 mg by mouth every Monday, Wednesday, and Friday.     Labs: Basic Metabolic Panel:  Recent Labs Lab 04/11/15 0815 04/11/15 0950  NA 130*  --   K >7.5* >7.5*  CL 89*  --   CO2 23  --   GLUCOSE 181*  --   BUN 76*  --   CREATININE 10.03*  --   CALCIUM 9.4  --    Liver Function Tests:  Recent Labs Lab 04/11/15 0815  AST 28  ALT 12*  ALKPHOS 57  BILITOT 1.0  PROT 7.3  ALBUMIN 3.6    CBC:  Recent Labs Lab 04/11/15 0815  WBC 12.5*  HGB 9.0*  HCT 27.5*  MCV 90.8  PLT 227    Studies/Results: Dg Chest Port 1 View  04/11/2015   CLINICAL DATA:  Progressive shortness of breath. History of ovarian carcinoma.  EXAM: PORTABLE CHEST - 1 VIEW  COMPARISON:  November 16, 2014  FINDINGS: There is diffuse interstitial and patchy alveolar edema. There are bilateral effusions with cardiomegaly and pulmonary venous hypertension. No adenopathy is appreciable. No bone lesions.  IMPRESSION: Findings consistent with congestive heart failure. Superimposed pneumonia cannot be excluded radiographically.   Electronically Signed   By: Lowella Grip III M.D.   On: 04/11/2015 08:23   Review of Systems: + SOB starting yesterday- reports drinking a lot of fluid Denies chest pain/edema  Physical Exam: Filed Vitals:   04/11/15 0845 04/11/15 0900 04/11/15 0930 04/11/15  1020  BP: 186/73 168/76 194/80   Pulse: 94 92 107   Resp: 29 26 18    SpO2: 100% 100% 100% 96%     General: Well developed, well nourished, slightly restless when initially got up to HD - now resting comfortably Head: Normocephalic, atraumatic, sclera non-icteric, mucus membranes are moist Neck: Supple. +JVD Lungs: Unlabored. Shallow.  Heart: RRR with S1 S2. No murmurs, rubs, or gallops appreciated. Abdomen: Soft, obese non-tender, non-distended with normoactive bowel sounds. No rebound/guarding. No obvious abdominal masses. M-S:  Strength and tone appear normal for age. Lower extremities:without edema or ischemic changes, no open wounds  Neuro: Alert and oriented X 3. Moves all extremities spontaneously. Psych:  Responds to questions appropriately with a normal affect. Dialysis Access: L AVG patent on HD  Dialysis Orders:  TTS AF 4 hours    71.5kgs   2K/2.25 Ca     AVG      2400u heparin Hectorol 6 micera 225 q 2 weeks (last dose given- 6/21 )  Assessment/Plan: 1. Respiratory distress/volume overload- bilat pleural effusions. high fluid gains- chronic problem. Uses home 02- on bipap now. 2. Life threatening hyperkalemia - acute HD, 0K bath 30 min, 1 K for remainder, recheck K this evening  3. Leukocytosis- blood cultures pending. Vanc and zosyn started 4. ESRD -  TTS AF- HD today/ has not missed treatments K+ >7.5- will need HD tomorrow for volume.  5. Hypertension/volume  - 167/87 significant volume excess/ chronic high gains- home meds of amlodipine and labetalol. PRN hydralazine 6. Anemia  - hgb 9- due for micera today- give Aranesp today 7. Metabolic bone disease -  last phos 8.6 and PTH 98- cont sensipar, phoslo and hectorol 8. Nutrition - alb 3.6. Vitamin and supplements 9. Failed kidney/panc transplant- off meds 10. DM  Shelle Iron, NP [employer]  [phone] 04/11/2015, 10:55 AM   I have seen and examined this patient and agree with plan and  assessment in the above note. Pt with chronic non-adherence to fluid and dietary restrictions presented to her routine out HD with severe resp distress, required transport by EMS to [facility] and found to have life threatening hyperkalemia (non-hemolyzed K >7.5) in addition to massive volume overload. Emergency dialysis. Will require again tomorrow. Repeat K this evening. Willella Harding B,MD 04/11/2015 3:27 PM

## 2015-04-11 NOTE — ED Notes (Signed)
Respiratory at bedside.

## 2015-04-11 NOTE — H&P (Addendum)
[facility] History and Physical  [name] WCB:[id number] DOB: 1976/06/04 DOA: 04/11/2015  Referring physician: ED physician, Dr. Vanita Panda PCP: Tommy Medal, MD   Chief Complaint: dyspnea  HPI:  Pt is 39 yo female with multiple and complex medical history including ESRD on HD, history of kidney -pancreas transplant in 2010, last hospitalization from 10/31/2014 - 11/17/2014 (after cardiac arrest from hyperkalemia 8.12, left AMA 2/11), also with known uncontrolled DM with complications of ESRD, retinopathy, ACD, also with known uncontrolled HTN, history of malignant ovarian tumor in 2005, was in HD earlier today and was transferred to [facility] for continuation of HD due to high oxygen requirement (curerntly on BiPAP in [facility] ED). Please note that pt is somewhat restless and agitated, on BiPAP, able to answer simple questions but unable top provide full and detailed history. Pt reports cough, chills, chest tightness but again unable to fully provide details as she is visibly uncomfortable from dyspnea and with BiPAP.  Vital signs in ED notable for HR 90 - 110's with RR up to 30's bpm, O2 sat in 80's on arrival requiring placement on BiPAP, blood work notable for WBC 12.5, K > 7.5, BUN 76 and Cr 10, CXR c/w pulmonary vascular congestion. PCCM consulted and TRH asked to admit for further evaluation. SDU bed requested.  Assessment and Plan: Principal Problem:   Acute respiratory failure with hypoxia - likely secondary to pulmonary vascular congestion, acute on chronic systolic and diastolic CHF, ? Underlying PNA - admit to SDU - nephrology consulted for urgent HD - PCCM also consulted as pt at high risk for intubation if no improvement in respiratory status with HD - will treat with broad spectrum ABX for now Vanc and zosyn until more date is back and given pt's known history of kidney-pancreas transplant  Active Problems:   ESRD on hemodialysis - appreciate nephrology team assistance - pt taken to  HD     Acute hyperkalemia - in the setting of ESRD - repeat BMP post HD    Sepsis - criteria for sepsis met on admission with HR up to 110's, RR up to 30's, WBC 12.5, suspect PNA - no temp checked on admission but pt warm to touch and reported fevers and chills  - awaiting for lactic acid, procalcitonin level - follow sepsis protocol for now until we get more data back: urine and blood culture, UA, sputum analysis, urine legionella and strep pneumo - will place on broad spectrum ABX for now and readjust the regimen as clinically indicated  - repeat CBC in AM    Acute on chronic combined systolic and diastolic CHF - per 2 D ECHO in 10/2014 EF 25 % and grade 2 diastolic CHF - repeat 2 D ECHO 11/2014 with recovered systolic function and EF near normal of 50% - HD for volume control     Diabetes mellitus with multiple complications, ESRD, ACD, retinopathy, gastroparesis  - check A1C - start Lantus at 50% home dose while pt is NPO - also place on SSI  - resume Reglan once oral intake improves     Hypertensive urgency - suspect HD will help with lowering BP - continue Clonidine and Labetalol per home medical regimen - add Hydralazine as needed for SBP > 155    Anemia of renal disease - no indication for transfusion at this time - CBC in AM    History of simultaneous kidney and pancreas transplant in 2010 - not on immunosuppressants per medical records     Anoxic  encephalopathy - secondary to the above, respiratory failure from pulmonary vascular congestion, ? Sepsis - monitor and treat supportively as noted above     Ovarian tumor, malignant - s/p resection in 1610    Metabolic bone disease  -last phos 8.6 and PTH 98 - cont sensipar, phoslo and hectorol    Morbid obesity - BMI > 35 with underlying risks factors of HTN, HLD, ESRD   Radiological Exams on Admission: Dg Chest Port 1 View  04/11/2015   Findings consistent with congestive heart failure. Superimposed pneumonia  cannot be excluded radiographically.    Code Status: Full Family Communication: Pt at bedside Disposition Plan: Admit for further evaluation    Mart Piggs [facility] [phone]   Review of Systems:  Difficult to obtain due to acute encephalopathy     Past Medical History  Diagnosis Date  . HTN (hypertension)     not well controlled  . 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 History of blood transfusion     "lots of times" (01/27/2013)  . CAP (community acquired pneumonia) 2010  . Type II diabetes mellitus   . Anemia   . Ovarian tumor, malignant 2005    resected in [place]' pt denies that this was cancer on 01/27/2013  . Diabetic retinopathy   . On home oxygen therapy     "5L; 24/7" (01/25/2014)  . ESRD on hemodialysis 08/25/2013    Started HD in 2008.  Had [place] transplant in 2010 at [facility].  Went back on HD in 2012.  Pancreas was removed surgically at [facility] for rejection w abcess formation.  The kidney was left in.  She now gets HD at [facility] on TTS schedule.     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  Both organs have since rejected, see "ESRD" for details.     Past Surgical History  Procedure Laterality Date  . Tumor removal  2005    Ovarian, resected in [place]  . Av fistula placement  06/03/2012    Procedure: INSERTION OF ARTERIOVENOUS (AV) GORE-TEX GRAFT ARM;  Surgeon: Rosetta Posner, MD;  Location: [place];  Service: Vascular;  Laterality: Left;  [name] Eye surgery Right     "cause of diabetes" (4/23/20140  . Combined kidney-pancreas transplant  2010    /notes 01/27/2013; @ [facility]  . 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 Renal biopsy  12/13/2010; 10/25/2011    Archie Endo 01/27/2013  . Tee without cardioversion N/A 04/22/2014    Procedure: TRANSESOPHAGEAL ECHOCARDIOGRAM (TEE);  Surgeon: Fay Records, MD;  Location: [facility] ENDOSCOPY;  Service: Cardiovascular;  Laterality: N/A;  [name]    Social History:  reports that she has never smoked. She has never used smokeless tobacco. She reports that she does not drink alcohol or use illicit drugs.  Allergies  Allergen Reactions  . Ace Inhibitors Cough    Family History  Problem Relation Age of Onset  . Diabetes Mother   . Liver cancer Father   . Cancer Father     Prior to Admission medications   Medication Sig Start Date End Date Taking? Authorizing Provider  acetaminophen (TYLENOL) 500 MG tablet Take 1,000 mg by mouth every 6 (six) hours as needed for moderate pain.   Yes Historical Provider, MD  amLODipine (NORVASC) 10  MG tablet Take 10 mg by mouth at bedtime.    Yes Historical Provider, MD  aspirin EC 81 MG tablet Take 81 mg by mouth at bedtime.    Yes Historical Provider, MD  B Complex-C-Folic Acid (RENA-VITE PO) Take 1 tablet by mouth daily.   Yes Historical Provider, MD  calcium acetate (PHOSLO) 667 MG capsule Take 3 capsules (2,001 mg total) by mouth 3 (three) times daily with meals. 01/28/13  Yes Alric Seton, PA-C  cinacalcet (SENSIPAR) 30 MG tablet Take 30 mg by mouth daily.   Yes Historical Provider, MD  cloNIDine (CATAPRES) 0.1 MG tablet Take 0.1 mg by mouth 2 (two) times daily.    Yes Historical Provider, MD  diphenhydrAMINE (BENADRYL) 25 mg capsule Take 25 mg by mouth daily as needed for allergies.   Yes Historical Provider, MD  escitalopram (LEXAPRO) 20 MG tablet Take 10 mg by mouth daily.    Yes Historical Provider, MD  insulin aspart (NOVOLOG) 100 UNIT/ML injection Inject 12 Units into the skin 2 (two) times daily with breakfast and lunch.    Yes Historical Provider, MD  insulin glargine (LANTUS) 100 UNIT/ML injection Inject 0.5 mLs (50 Units total) into the skin at bedtime. 04/23/14  Yes Delfina Redwood, MD  labetalol (NORMODYNE) 300 MG tablet Take 300 mg by mouth 2 (two) times daily.   Yes Historical Provider, MD   metoCLOPramide (REGLAN) 5 MG tablet Take 5 mg by mouth 4 (four) times daily.   Yes Historical Provider, MD  oxyCODONE-acetaminophen (PERCOCET/ROXICET) 5-325 MG per tablet Take 1 tablet by mouth every 4 (four) hours as needed for moderate pain. 11/15/14  Yes Donne Hazel, MD  pantoprazole (PROTONIX) 40 MG tablet Take 40 mg by mouth daily.   Yes Historical Provider, MD  simvastatin (ZOCOR) 20 MG tablet Take 20 mg by mouth every Monday, Wednesday, and Friday.   Yes Historical Provider, MD    Physical Exam: Filed Vitals:   04/11/15 0845 04/11/15 0900 04/11/15 0930 04/11/15 1020  BP: 186/73 168/76 194/80   Pulse: 94 92 107   Resp: '29 26 18   ' SpO2: 100% 100% 100% 96%    Physical Exam  Constitutional: Appears in mod distress due to dyspnea, restless and pulling lines  HENT: Normocephalic. External right and left ear normal. Oropharynx is clear and moist.  Eyes: Conjunctivae and EOM are normal. PERRLA, no scleral icterus.  Neck: Normal ROM. Neck supple. No JVD. No tracheal deviation. No thyromegaly.  CVS: Tachycardic, S1 and S2 appreciated  Pulmonary: Tachypnea with RR in mid 20's, diminished air movement throughout, exp wheezing noted as well with scattered rhonchi bilaterally  Abdominal: Soft. BS +,  no distension, tenderness, rebound or guarding.  Musculoskeletal: Normal range of motion.  Lymphadenopathy: No lymphadenopathy noted, cervical, inguinal. Neuro: Alert and following simple commands, moving all 4 extremities with no difficulties  Skin: Skin is warm and dry. No rash noted. Not diaphoretic. No erythema. No pallor.  Psychiatric: difficult to assess due to critical status and restlessness   Labs on Admission:  Basic Metabolic Panel:  Recent Labs Lab 04/11/15 0815  NA 130*  K >7.5*  CL 89*  CO2 23  GLUCOSE 181*  BUN 76*  CREATININE 10.03*  CALCIUM 9.4  MG 2.7*   Liver Function Tests:  Recent Labs Lab 04/11/15 0815  AST 28  ALT 12*  ALKPHOS 57  BILITOT 1.0  PROT  7.3  ALBUMIN 3.6   CBC:  Recent Labs Lab 04/11/15 0815  WBC 12.5*  HGB 9.0*  HCT 27.5*  MCV 90.8  PLT 227    EKG: pending    If 7PM-7AM, please contact night-coverage [email or web address] Password [email or web address] 04/11/2015, 10:25 AM

## 2015-04-11 NOTE — ED Notes (Signed)
Spoke with Vanita Panda MD about clamp on pt left arm from dialysis.  MD advises RN to leave clamp on at this time.

## 2015-04-11 NOTE — Progress Notes (Signed)
**Note De-Identified [name] Obfuscation** Patient transferred to dialysis on NIV

## 2015-04-11 NOTE — Progress Notes (Signed)
04/11/2015 Dr Doyle Askew is aware patient [name] was 0.05 at 1630 and that patient was c/o dizziness. Patient blood pressure 167/70 and 177/67. [facility] RN.

## 2015-04-12 ENCOUNTER — Inpatient Hospital Stay (HOSPITAL_COMMUNITY): Payer: Medicare Other

## 2015-04-12 LAB — CBC
HEMATOCRIT: 25.8 % — AB (ref 36.0–46.0)
Hemoglobin: 8.3 g/dL — ABNORMAL LOW (ref 12.0–15.0)
MCH: 29.4 pg (ref 26.0–34.0)
MCHC: 32.2 g/dL (ref 30.0–36.0)
MCV: 91.5 fL (ref 78.0–100.0)
Platelets: 271 10*3/uL (ref 150–400)
RBC: 2.82 MIL/uL — AB (ref 3.87–5.11)
RDW: 15.7 % — AB (ref 11.5–15.5)
WBC: 7 10*3/uL (ref 4.0–10.5)

## 2015-04-12 LAB — GLUCOSE, CAPILLARY
GLUCOSE-CAPILLARY: 184 mg/dL — AB (ref 65–99)
Glucose-Capillary: 104 mg/dL — ABNORMAL HIGH (ref 65–99)
Glucose-Capillary: 173 mg/dL — ABNORMAL HIGH (ref 65–99)

## 2015-04-12 LAB — COMPREHENSIVE METABOLIC PANEL
ALBUMIN: 3.3 g/dL — AB (ref 3.5–5.0)
ALT: 9 U/L — ABNORMAL LOW (ref 14–54)
AST: 10 U/L — AB (ref 15–41)
Alkaline Phosphatase: 49 U/L (ref 38–126)
Anion gap: 12 (ref 5–15)
BUN: 30 mg/dL — ABNORMAL HIGH (ref 6–20)
CALCIUM: 9.2 mg/dL (ref 8.9–10.3)
CO2: 30 mmol/L (ref 22–32)
Chloride: 93 mmol/L — ABNORMAL LOW (ref 101–111)
Creatinine, Ser: 5.79 mg/dL — ABNORMAL HIGH (ref 0.44–1.00)
GFR calc Af Amer: 10 mL/min — ABNORMAL LOW (ref >60)
GFR, EST NON AFRICAN AMERICAN: 8 mL/min — AB (ref >60)
Glucose, Bld: 75 mg/dL (ref 65–99)
Potassium: 4.8 mmol/L (ref 3.5–5.1)
SODIUM: 135 mmol/L (ref 135–145)
Total Bilirubin: 0.7 mg/dL (ref 0.3–1.2)
Total Protein: 7.9 g/dL (ref 6.5–8.1)

## 2015-04-12 LAB — TROPONIN I: Troponin I: 0.05 ng/mL — ABNORMAL HIGH (ref <0.031)

## 2015-04-12 LAB — HEMOGLOBIN A1C
HEMOGLOBIN A1C: 6.5 % — AB (ref 4.8–5.6)
MEAN PLASMA GLUCOSE: 140 mg/dL

## 2015-04-12 LAB — MAGNESIUM: Magnesium: 2.3 mg/dL (ref 1.7–2.4)

## 2015-04-12 LAB — PHOSPHORUS: Phosphorus: 7.4 mg/dL — ABNORMAL HIGH (ref 2.5–4.6)

## 2015-04-12 MED ORDER — VANCOMYCIN HCL IN DEXTROSE 750-5 MG/150ML-% IV SOLN
750.0000 mg | INTRAVENOUS | Status: DC
  Administered 2015-04-12: 750 mg via INTRAVENOUS
  Filled 2015-04-12 (×2): qty 150

## 2015-04-12 MED ORDER — VANCOMYCIN HCL IN DEXTROSE 750-5 MG/150ML-% IV SOLN: 750.0000 mg | INTRAVENOUS | Status: DC

## 2015-04-12 NOTE — Progress Notes (Signed)
ANTIBIOTIC CONSULT NOTE - Follow-up  Pharmacy Consult for vancomycin Indication: pneumonia  Allergies  Allergen Reactions  . Ace Inhibitors Cough    Patient Measurements: Weight: 162 lb 7.7 oz (73.7 kg)  Body Weight: 71.3 kg  Vital Signs: Temp: 98.2 F (36.8 C) (07/06 0806) Temp Source: Oral (07/06 0806) BP: 140/67 mmHg (07/06 0806) Pulse Rate: 83 (07/06 0806) Intake/Output from previous day: 07/05 0701 - 07/06 0700 In: 423 [P.O.:240; I.V.:183] Out: 4630  Intake/Output from this shift:    Labs:  Recent Labs  04/11/15 0815  04/11/15 1710 04/11/15 1848 04/12/15 0230  WBC 12.5*  --  9.2  --  7.0  HGB 9.0*  --  7.3*  --  8.3*  PLT 227  --  298  --  271  CREATININE 10.03*  < > 4.32* 4.63* 5.79*  < > = values in this interval not displayed. Estimated Creatinine Clearance: 12.8 mL/min (by C-G formula based on Cr of 5.79). No results for input(s): VANCOTROUGH, VANCOPEAK, VANCORANDOM, GENTTROUGH, GENTPEAK, GENTRANDOM, TOBRATROUGH, TOBRAPEAK, TOBRARND, AMIKACINPEAK, AMIKACINTROU, AMIKACIN in the last 72 hours.   Microbiology: Recent Results (from the past 720 hour(s))  MRSA PCR Screening     Status: None   Collection Time: 04/11/15  4:36 PM  Result Value Ref Range Status   MRSA by PCR NEGATIVE NEGATIVE Final    Comment:        The GeneXpert MRSA Assay (FDA approved for NASAL specimens only), is one component of a comprehensive MRSA colonization surveillance program. It is not intended to diagnose MRSA infection nor to guide or monitor treatment for MRSA infections.   Culture, blood (x 2)     Status: None (Preliminary result)   Collection Time: 04/11/15  5:00 PM  Result Value Ref Range Status   Specimen Description BLOOD RIGHT ARM  Final   Special Requests BOTTLES DRAWN AEROBIC AND ANAEROBIC 5CC  Final   Culture PENDING  Incomplete   Report Status PENDING  Incomplete  Culture, blood (x 2)     Status: None (Preliminary result)   Collection Time: 04/11/15  5:15  PM  Result Value Ref Range Status   Specimen Description BLOOD RIGHT ARM  Final   Special Requests BOTTLES DRAWN AEROBIC AND ANAEROBIC 5CC  Final   Culture PENDING  Incomplete   Report Status PENDING  Incomplete   Assessment: 50 yof continues on empiric vancomycin + zosyn for pneumonia, r/o sepsis. Pt is afebrile and WBC is WNL. Per renal note, planning extra HD today for volume management then back on outpatient schedule tomorrow.    Vanc 7/5>> Zosyn 7/5>>  7/5 Blood - NGTD  Goal of Therapy:  Pre HD vanc level 15-25 mg/L  Plan:  - Continue zosyn 2.25gm IV Q8H - Vanc 750mg  post-HD today then TTS with HD - F/u renal plans, C&S, clinical status and pre-HD level at [facility] - Consider DC antibiotics if no infectious source noted  Salome Arnt, PharmD, BCPS Pager # [phone] 04/12/2015 11:27 AM

## 2015-04-12 NOTE — Consult Note (Signed)
PULMONARY / CRITICAL CARE MEDICINE   Name: [name] MRN: [id number] DOB: [birth year]    ADMISSION DATE:  04/11/2015 CONSULTATION DATE:  04/11/15  REFERRING MD :  Dr. 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  Work up concerning for volume overload / HTN.   STUDIES:  7/05  PCXR >> bilateral pleural effusions, R>L  SIGNIFICANT EVENTS: 7/05  Admit with SOB, suspected volume overload.  PCCM consulted for resp distress.  To HD with 4.2 L removed    SUBJECTIVE:  Pt denies complaints, feels much better.  No distress.  Eating breakfast    VITAL SIGNS: Temp:  [98 F (36.7 C)-99.2 F (37.3 C)] 98.2 F (36.8 C) (07/06 0806) Pulse Rate:  [65-107] 83 (07/06 0806) Resp:  [12-34] 19 (07/06 0806) BP: (122-195)/(65-109) 140/67 mmHg (07/06 0806) SpO2:  [96 %-100 %] 100 % (07/06 0506) FiO2 (%):  [40 %] 40 % (07/05 1020) Weight:  [160 lb 15 oz (73 kg)-162 lb 7.7 oz (73.7 kg)] 162 lb 7.7 oz (73.7 kg) (07/05 1614)   VENTILATOR SETTINGS: Vent Mode:  [-]  FiO2 (%):  [40 %] 40 %   INTAKE / OUTPUT:  Intake/Output Summary (Last 24 hours) at 04/12/15 5176 Last data filed at 04/12/15 0440  Gross per 24 hour  Intake    423 ml  Output   4630 ml  Net  -4207 ml    PHYSICAL EXAMINATION: General:  Chronically ill adult female in NAD  Neuro:  AAOx4, answers appropriately, MAE  HEENT:  MM pink/moist, JVD+ Cardiovascular:  s1s2 rrr, no m/r/g Lungs:  resp's even/non-labored, lungs bilaterally diminished posteriorly R>L Abdomen:  NTND, BSx4 active  Musculoskeletal:  No acute deformities  Skin:  Warm/dry, no edema, scattered LE abrasions  LABS:  CBC  Recent Labs Lab 04/11/15 0815 04/11/15 1710 04/12/15 0230  WBC 12.5* 9.2 7.0  HGB 9.0* 7.3* 8.3*  HCT 27.5* 22.2* 25.8*  PLT 227 298 271   Coag's  Recent  Labs Lab 04/11/15 1710  APTT 36  INR 1.05    BMET  Recent Labs Lab 04/11/15 1710 04/11/15 1848 04/12/15 0230  NA 135 133* 135  K 4.1 4.4 4.8  CL 93* 92* 93*  CO2 28 29 30   BUN 17 20 30*  CREATININE 4.32* 4.63* 5.79*  GLUCOSE 89 76 75   Electrolytes  Recent Labs Lab 04/11/15 0815 04/11/15 1630 04/11/15 1710 04/11/15 1848 04/12/15 0230  CALCIUM 9.4 9.4 9.2 8.9 9.2  MG 2.7*  --   --   --  2.3  PHOS  --  <1.0*  --  6.0* 7.4*   Sepsis Markers  Recent Labs Lab 04/11/15 1710 04/11/15 1900  LATICACIDVEN 1.0 0.7  PROCALCITON 0.86  --     Liver Enzymes  Recent Labs Lab 04/11/15 0815  04/11/15 1710 04/11/15 1848 04/12/15 0230  AST 28  --  13*  --  10*  ALT 12*  --  9*  --  9*  ALKPHOS 57  --  60  --  49  BILITOT 1.0  --  0.7  --  0.7  ALBUMIN 3.6  < > 3.8 3.6 3.3*  < > = values in this interval not displayed.   Cardiac Enzymes  Recent Labs Lab 04/11/15 1630 04/11/15 1710 04/11/15 2305  TROPONINI 0.05* 0.04* 0.05*    Glucose  Recent Labs Lab 04/11/15 1624 04/11/15 2308 04/12/15 0806  GLUCAP 80 158* 104*    Imaging Dg Chest Port 1 View  04/12/2015   CLINICAL DATA:  Acute respiratory failure  EXAM: PORTABLE CHEST - 1 VIEW  COMPARISON:  04/11/2015  FINDINGS: Improved aeration with apical interstitial opacities decreased. Pleural fluid is similar in volume. Stable cardiomegaly. Stable aortic and hilar contours.  IMPRESSION: 1. Mild improvement in pulmonary edema. 2. Stable pleural effusions.   Electronically Signed   By: Monte Fantasia M.D.   On: 04/12/2015 06:21     ASSESSMENT / PLAN:  PULMONARY OETT A: Acute on Chronic Hypoxemic Respiratory Failure - in setting of volume overload, acute issues resolved 7/6 Bilateral Pleural Effusions, R>L  Oxygen Dependent at Baseline, 2L - ? Unclear reasons P:   BiPAP as needed for increased WOB Intermittent CXR  O2 to support sats > 90%  CARDIOVASCULAR A:  Hypertensive Urgency  HTN  Mild Troponin  Leak - in setting of HTN'sive urgency, volume overload  P:  Continue ASA, clonidine, labetalol  PRN hydralazine  Tele monitoring  Trend EKG, troponin  RENAL A:   ESRD - on HD  Hyperkalemia Hx Failed Renal Transplant  P:   Trend BMP  HD per Nephrology  Replace electrolytes as needed  GASTROINTESTINAL A:   No acute issues  P:   Diet as tolerated PRN zofran   HEMATOLOGIC A:   Anemia of Chronic Disease  P:  Trend CBC  DVT Prop:  Heparin sq  INFECTIOUS A:   R/O Sepsis - leukocytosis P:   BCx2 7/5 >>  UC 7/5 >>  U. Strep 7/5 >>  U. Legionella 7/5 >>   Vanco, start date 7/5, day 2/x Zosyn, start date 7/5, day 2/x  Follow PCT & lactic acid Hopeful to narrow abx quickly   ENDOCRINE A:   DM II   P:   SSI   NEUROLOGIC A:   No acute issues  P:   Monitor neuro status    FAMILY  - Updates: Patient updated at bedside.     GLOBAL:  Acute respiratory issues resolved.  PCCM will sign off.  Please call back if new needs arise.    Noe Gens, NP-C [facility] Pgr: [phone] or if no answer [phone] 04/12/2015, 8:23 AM

## 2015-04-12 NOTE — Progress Notes (Signed)
[facility] Rounding Note Subjective:  Feels better Still does not think she "did anything wrong"  Husband says she has already had 3 cups of water this AM! Has been educated multiple times about diet and fluid restriction  Objective Vital signs in last 24 hours: Filed Vitals:   04/11/15 2043 04/12/15 0005 04/12/15 0506 04/12/15 0806  BP: 178/75 128/65 122/72 140/67  Pulse: 96 84 82 83  Temp: 99.2 F (37.3 C) 98 F (36.7 C) 98.1 F (36.7 C) 98.2 F (36.8 C)  TempSrc: Axillary Oral Oral Oral  Resp: 15 19 19 19   Weight:      SpO2: 100% 99% 100%    Weight change:   Intake/Output Summary (Last 24 hours) at 04/12/15 1058 Last data filed at 04/12/15 0440  Gross per 24 hour  Intake    423 ml  Output   4630 ml  Net  -4207 ml    Physical Exam:  BP 140/67 mmHg  Pulse 83  Temp(Src) 98.2 F (36.8 C) (Oral)  Resp 19  Wt 73.7 kg (162 lb 7.7 oz)  SpO2 100%  LMP  (LMP Unknown) Sitting on edge of bed wearing O2 Diminished BS at right base with few base crackles bilaterally S1S2 No def S3 Abd soft, NT. Site of old renal allograft NT 1+ edema LE's  Labs:   Recent Labs Lab 04/11/15 0815 04/11/15 0950 04/11/15 1630 04/11/15 1710 04/11/15 1848 04/12/15 0230  NA 130*  --  136 135 133* 135  K >7.5* >7.5* <2.0* 4.1 4.4 4.8  CL 89*  --  91* 93* 92* 93*  CO2 23  --  30 28 29 30   GLUCOSE 181*  --  82 89 76 75  BUN 76*  --  <5* 17 20 30*  CREATININE 10.03*  --  1.02* 4.32* 4.63* 5.79*  CALCIUM 9.4  --  9.4 9.2 8.9 9.2  PHOS  --   --  <1.0*  --  6.0* 7.4*     Recent Labs Lab 04/11/15 0815  04/11/15 1710 04/11/15 1848 04/12/15 0230  AST 28  --  13*  --  10*  ALT 12*  --  9*  --  9*  ALKPHOS 57  --  60  --  49  BILITOT 1.0  --  0.7  --  0.7  PROT 7.3  --  9.3*  --  7.9  ALBUMIN 3.6  < > 3.8 3.6 3.3*  < > = values in this interval not displayed. No results for input(s): LIPASE, AMYLASE in the last 168 hours. No results for input(s): AMMONIA in the  last 168 hours.   Recent Labs Lab 04/11/15 0815 04/11/15 1710 04/12/15 0230  WBC 12.5* 9.2 7.0  NEUTROABS  --  7.7  --   HGB 9.0* 7.3* 8.3*  HCT 27.5* 22.2* 25.8*  MCV 90.8 89.9 91.5  PLT 227 298 271     Recent Labs Lab 04/11/15 1630 04/11/15 1710 04/11/15 2305  TROPONINI 0.05* 0.04* 0.05*     Recent Labs Lab 04/11/15 1624 04/11/15 2308 04/12/15 0806  GLUCAP 80 158* 104*    Studies/Results: Dg Chest Port 1 View  04/12/2015   CLINICAL DATA:  Acute respiratory failure  EXAM: PORTABLE CHEST - 1 VIEW  COMPARISON:  04/11/2015  FINDINGS: Improved aeration with apical interstitial opacities decreased. Pleural fluid is similar in volume. Stable cardiomegaly. Stable aortic and hilar contours.  IMPRESSION: 1. Mild improvement in pulmonary edema. 2. Stable pleural effusions.   Electronically Signed  By: Monte Fantasia M.D.   On: 04/12/2015 06:21   Dg Chest Port 1 View  04/11/2015   CLINICAL DATA:  Progressive shortness of breath. History of ovarian carcinoma.  EXAM: PORTABLE CHEST - 1 VIEW  COMPARISON:  November 16, 2014  FINDINGS: There is diffuse interstitial and patchy alveolar edema. There are bilateral effusions with cardiomegaly and pulmonary venous hypertension. No adenopathy is appreciable. No bone lesions.  IMPRESSION: Findings consistent with congestive heart failure. Superimposed pneumonia cannot be excluded radiographically.   Electronically Signed   By: Lowella Grip III M.D.   On: 04/11/2015 08:23   Medications:   . aspirin EC  81 mg Oral QHS  . calcium acetate  2,001 mg Oral TID WC  . cinacalcet  30 mg Oral Q breakfast  . cloNIDine  0.1 mg Oral BID  . darbepoetin (ARANESP) injection - DIALYSIS  200 mcg Intravenous Q Tue-HD  . doxercalciferol  6 mcg Intravenous Q T,Th,Sa-HD  . escitalopram  10 mg Oral Daily  . heparin  5,000 Units Subcutaneous 3 times per day  . insulin aspart  0-9 Units Subcutaneous TID WC  . insulin glargine  20 Units Subcutaneous QHS  .  labetalol  300 mg Oral BID  . multivitamin  1 tablet Oral QHS  . piperacillin-tazobactam (ZOSYN)  IV  2.25 g Intravenous Q8H  . sodium chloride  3 mL Intravenous Q12H   Dialysis Orders: TTS AF 4 hours 71.5kgs 2K/2.25 Ca AVG 2400u heparin Hectorol 6 micera 225 q 2 weeks (last dose given- 6/21 )  Assessment/Plan: 1. Respiratory distress/volume overload- bilat pleural effusions. high fluid gains- chronic problem. Uses home 02. Emergency HD yesterday -  4.6 liters removed. Post HD weight 73.7 Still 2 kg over EDW , CXR still wet and for another treatment today. Discussed with pt and husband seriousness of this - he says "he can't do anything with her"  2. Life threatening hyperkalemia - post HD K's in the 4's. .  3. Leukocytosis- blood cultures pending. Vanc and zosyn started. WBC down. Afebrile. Doubt infection playing any role here. 4. ESRD - TTS AF- HD again today for volume (then tomorrow usual schedule) 5. Hypertension/volume - Significant volume excess/ chronic high gains- home meds of amlodipine and labetalol. PRN hydralazine 6. Anemia - hgb 9- due for micera today- give Aranesp today 7. Metabolic bone disease - last phos 8.6 and PTH 98- cont sensipar, phoslo and hectorol 8. Nutrition - alb 3.6. Vitamin and supplements 9. Failed kidney/panc transplant- off meds 10. DM 11. Medical non-compliance - discussed w/pt that she could easily have died yesterday and husband agrees she is non-adherent. She seems almost amused, if not indifferent. 12. Disposition - in reality could probably be discharged after HD today, depending on what time we can get it done. I suspect her EDW needs to be lowered, but her gains are always so massive its hard to tell. Will challenge her weight today.  Jamal Maes, MD [facility] [phone] pager 04/12/2015, 10:58 AM

## 2015-04-12 NOTE — Procedures (Signed)
I have personally attended this patient's dialysis session.   2nd serial HD 4 liter goal BP 140's AVG 400 OLC green Pre HD weight 76.7 with EDW  71 AND gained 3 kg over night in the hospital!!!   Jamal Maes, MD [facility] [phone] Pager 04/12/2015, 5:09 PM

## 2015-04-12 NOTE — Progress Notes (Addendum)
[facility] TEAM 1 - Stepdown/ICU TEAM Progress Note  [name]:[id number] DOB: 07/16/1976 DOA: 04/11/2015 PCP: Tommy Medal, MD  Admit HPI / Brief Narrative: 39 yo female with a complex medical history including ESRD on HD, kidney -pancreas transplant in 2010, last hospitalization 10/31/2014 - 11/17/2014 (after cardiac arrest from hyperkalemia 8.12, left AMA 2/11), also with known uncontrolled DM with complications of ESRD, retinopathy, ACD, also with known uncontrolled HTN, history of malignant ovarian tumor in 2005, who was in HD the day of her admission and was transferred to [facility] for continuation of HD due to high oxygen requirement (on BiPAP in [facility] ED).   Vital signs in ED notable for HR 90 - 110's with RR up to 30's bpm, O2 sat in 80's on arrival requiring placement on BiPAP, blood work notable for WBC 12.5, K > 7.5, BUN 76 and Cr 10, CXR c/w pulmonary vascular congestion.  HPI/Subjective: The patient is seen on the hemodialysis unit.  She is resting comfortably while undergoing dialysis.  She denies chest pain shortness breath fevers chills nausea or vomiting at this time.  She has developed a mild epistaxis during her treatment but this appears to have stopped spontaneously.  Assessment/Plan:  Acute hypoxic respiratory failure - Pulmonary edema/severe volume overload Likely due to fluid intake indiscretion/dietary indiscretion - I have attempted to reinforce the discussion the patient had with the nephrologist concerning the life-threatening nature of this admission and the fact that her noncompliance could easily kill her - she remained flat throughout our discussion but did acknowledge reception of the information  End-stage renal disease on hemodialysis (T/Th/Sat) Ongoing care per nephrology  Life-threatening severe Hyperkalemia Corrected with serial inpatient hemodialysis treatments  SIRS v/s Sepsis No objective evidence of infection suggesting this was service and not sepsis -  discontinue antibiotics and follow overnight  Hypertensive urgency Due to volume overload discussed above - improving with ongoing dialysis  Acute on chronic combined systolic and diastolic CHF - per 2 D ECHO in 10/2014 EF 25 % and grade 2 diastolic CHF - repeat 2 D ECHO 11/2014 with recovered systolic function and EF near normal of 50% - Volume management per dialysis and with dietary compliance  Anemia of renal disease - no indication for transfusion at this time  History of simultaneous kidney and pancreas transplant in 2010 - not on immunosuppressants per medical records   DM - CBG reasonably controlled - cont current doing (fractin of home regimen) and carb mod renal diet - follow trend  Anoxic encephalopathy - Mental status appears to be rapidly returning to baseline with ongoing dialysis treatments - follow  Ovarian tumor, malignant - s/p resection in 8119  Metabolic bone disease  - cont sensipar, phoslo and hectorol per nephrology  Morbid obesity - BMI > 35 with underlying risks factors of HTN, HLD, ESRD  Code Status: FULL Family Communication: no family present at time of exam Disposition Plan: Monitor overnight with probable discharge home 7/7 after dialysis  Consultants: Nephrology Pulmonary critical care medicine  Procedures: None  Antibiotics: Zosyn 7/5 > 7/6 Vancomycin 7/5 > 7/6  DVT prophylaxis: Heparin  Objective: Blood pressure 156/77, pulse 80, temperature 98.1 F (36.7 C), temperature source Oral, resp. rate 13, weight 76.7 kg (169 lb 1.5 oz), SpO2 100 %.  Intake/Output Summary (Last 24 hours) at 04/12/15 1645 Last data filed at 04/12/15 0440  Gross per 24 hour  Intake    423 ml  Output      0 ml  Net  423 ml   Exam: General: No acute respiratory distress Lungs: Clear to auscultation bilaterally without wheezes or crackles Cardiovascular: Regular rate and rhythm without murmur gallop or rub normal S1 and S2 Abdomen: Nontender,  nondistended, soft, bowel sounds positive, no rebound, no ascites, no appreciable mass Extremities: No significant cyanosis, or clubbing, trace edema bilateral lower extremities  Data Reviewed: Basic Metabolic Panel:  Recent Labs Lab 04/11/15 0815 04/11/15 0950 04/11/15 1630 04/11/15 1710 04/11/15 1848 04/12/15 0230  NA 130*  --  136 135 133* 135  K >7.5* >7.5* <2.0* 4.1 4.4 4.8  CL 89*  --  91* 93* 92* 93*  CO2 23  --  30 28 29 30   GLUCOSE 181*  --  82 89 76 75  BUN 76*  --  <5* 17 20 30*  CREATININE 10.03*  --  1.02* 4.32* 4.63* 5.79*  CALCIUM 9.4  --  9.4 9.2 8.9 9.2  MG 2.7*  --   --   --   --  2.3  PHOS  --   --  <1.0*  --  6.0* 7.4*    CBC:  Recent Labs Lab 04/11/15 0815 04/11/15 1710 04/12/15 0230  WBC 12.5* 9.2 7.0  NEUTROABS  --  7.7  --   HGB 9.0* 7.3* 8.3*  HCT 27.5* 22.2* 25.8*  MCV 90.8 89.9 91.5  PLT 227 298 271    Liver Function Tests:  Recent Labs Lab 04/11/15 0815 04/11/15 1630 04/11/15 1710 04/11/15 1848 04/12/15 0230  AST 28  --  13*  --  10*  ALT 12*  --  9*  --  9*  ALKPHOS 57  --  60  --  49  BILITOT 1.0  --  0.7  --  0.7  PROT 7.3  --  9.3*  --  7.9  ALBUMIN 3.6 4.2 3.8 3.6 3.3*   Coags:  Recent Labs Lab 04/11/15 1710  INR 1.05    Recent Labs Lab 04/11/15 1710  APTT 36    Cardiac Enzymes:  Recent Labs Lab 04/11/15 1630 04/11/15 1710 04/11/15 2305  TROPONINI 0.05* 0.04* 0.05*    CBG:  Recent Labs Lab 04/11/15 1624 04/11/15 2308 04/12/15 0806 04/12/15 1155  GLUCAP 80 158* 104* 184*    Recent Results (from the past 240 hour(s))  MRSA PCR Screening     Status: None   Collection Time: 04/11/15  4:36 PM  Result Value Ref Range Status   MRSA by PCR NEGATIVE NEGATIVE Final    Comment:        The GeneXpert MRSA Assay (FDA approved for NASAL specimens only), is one component of a comprehensive MRSA colonization surveillance program. It is not intended to diagnose MRSA infection nor to guide  or monitor treatment for MRSA infections.   Culture, blood (x 2)     Status: None (Preliminary result)   Collection Time: 04/11/15  5:00 PM  Result Value Ref Range Status   Specimen Description BLOOD RIGHT ARM  Final   Special Requests BOTTLES DRAWN AEROBIC AND ANAEROBIC 5CC  Final   Culture NO GROWTH < 24 HOURS  Final   Report Status PENDING  Incomplete  Culture, blood (x 2)     Status: None (Preliminary result)   Collection Time: 04/11/15  5:15 PM  Result Value Ref Range Status   Specimen Description BLOOD RIGHT ARM  Final   Special Requests BOTTLES DRAWN AEROBIC AND ANAEROBIC 5CC  Final   Culture NO GROWTH < 24 HOURS  Final  Report Status PENDING  Incomplete     Studies:   Recent x-ray studies have been reviewed in detail by the Attending Physician  Scheduled Meds:  Scheduled Meds: . aspirin EC  81 mg Oral QHS  . calcium acetate  2,001 mg Oral TID WC  . cinacalcet  30 mg Oral Q breakfast  . cloNIDine  0.1 mg Oral BID  . darbepoetin (ARANESP) injection - DIALYSIS  200 mcg Intravenous Q Tue-HD  . doxercalciferol  6 mcg Intravenous Q T,Th,Sa-HD  . escitalopram  10 mg Oral Daily  . heparin  5,000 Units Subcutaneous 3 times per day  . insulin aspart  0-9 Units Subcutaneous TID WC  . insulin glargine  20 Units Subcutaneous QHS  . labetalol  300 mg Oral BID  . multivitamin  1 tablet Oral QHS  . piperacillin-tazobactam (ZOSYN)  IV  2.25 g Intravenous Q8H  . sodium chloride  3 mL Intravenous Q12H  . vancomycin  750 mg Intravenous Q M,W,F-HD  . [START ON 04/13/2015] vancomycin  750 mg Intravenous Q T,Th,Sa-HD    Time spent on care of this patient: 35 mins   Sierrah Luevano T , MD   [facility] Office  [phone] Pager - Text Page per Shea Evans as per below:  On-Call/Text Page:      [email or web address]      password [email or web address]  If 7PM-7AM, please contact night-coverage [email or web address] Password [email or web address] 04/12/2015, 4:45 PM   LOS: 1 day

## 2015-04-13 ENCOUNTER — Inpatient Hospital Stay (HOSPITAL_COMMUNITY): Payer: Medicare Other

## 2015-04-13 DIAGNOSIS — M9080 Osteopathy in diseases classified elsewhere, unspecified site: Secondary | ICD-10-CM

## 2015-04-13 DIAGNOSIS — J9601 Acute respiratory failure with hypoxia: Secondary | ICD-10-CM

## 2015-04-13 DIAGNOSIS — G9340 Encephalopathy, unspecified: Secondary | ICD-10-CM

## 2015-04-13 DIAGNOSIS — E08319 Diabetes mellitus due to underlying condition with unspecified diabetic retinopathy without macular edema: Secondary | ICD-10-CM

## 2015-04-13 DIAGNOSIS — N186 End stage renal disease: Secondary | ICD-10-CM

## 2015-04-13 DIAGNOSIS — E889 Metabolic disorder, unspecified: Secondary | ICD-10-CM | POA: Diagnosis present

## 2015-04-13 DIAGNOSIS — E119 Type 2 diabetes mellitus without complications: Secondary | ICD-10-CM

## 2015-04-13 DIAGNOSIS — E875 Hyperkalemia: Secondary | ICD-10-CM

## 2015-04-13 DIAGNOSIS — Z9114 Patient's other noncompliance with medication regimen: Secondary | ICD-10-CM

## 2015-04-13 DIAGNOSIS — I10 Essential (primary) hypertension: Secondary | ICD-10-CM

## 2015-04-13 DIAGNOSIS — K3184 Gastroparesis: Secondary | ICD-10-CM

## 2015-04-13 DIAGNOSIS — Z992 Dependence on renal dialysis: Secondary | ICD-10-CM

## 2015-04-13 DIAGNOSIS — J9621 Acute and chronic respiratory failure with hypoxia: Secondary | ICD-10-CM

## 2015-04-13 DIAGNOSIS — M898X9 Other specified disorders of bone, unspecified site: Secondary | ICD-10-CM

## 2015-04-13 DIAGNOSIS — E11319 Type 2 diabetes mellitus with unspecified diabetic retinopathy without macular edema: Secondary | ICD-10-CM | POA: Diagnosis present

## 2015-04-13 DIAGNOSIS — E1143 Type 2 diabetes mellitus with diabetic autonomic (poly)neuropathy: Secondary | ICD-10-CM

## 2015-04-13 DIAGNOSIS — D631 Anemia in chronic kidney disease: Secondary | ICD-10-CM

## 2015-04-13 LAB — BASIC METABOLIC PANEL
Anion gap: 13 (ref 5–15)
BUN: 27 mg/dL — ABNORMAL HIGH (ref 6–20)
CO2: 30 mmol/L (ref 22–32)
Calcium: 10.3 mg/dL (ref 8.9–10.3)
Chloride: 91 mmol/L — ABNORMAL LOW (ref 101–111)
Creatinine, Ser: 5.33 mg/dL — ABNORMAL HIGH (ref 0.44–1.00)
GFR calc non Af Amer: 9 mL/min — ABNORMAL LOW (ref >60)
GFR, EST AFRICAN AMERICAN: 11 mL/min — AB (ref >60)
GLUCOSE: 188 mg/dL — AB (ref 65–99)
Potassium: 4.5 mmol/L (ref 3.5–5.1)
SODIUM: 134 mmol/L — AB (ref 135–145)

## 2015-04-13 LAB — GLUCOSE, CAPILLARY
GLUCOSE-CAPILLARY: 230 mg/dL — AB (ref 65–99)
Glucose-Capillary: 116 mg/dL — ABNORMAL HIGH (ref 65–99)
Glucose-Capillary: 157 mg/dL — ABNORMAL HIGH (ref 65–99)
Glucose-Capillary: 95 mg/dL (ref 65–99)

## 2015-04-13 LAB — CBC WITH DIFFERENTIAL/PLATELET
BASOS PCT: 0 % (ref 0–1)
Basophils Absolute: 0 10*3/uL (ref 0.0–0.1)
EOS ABS: 0.1 10*3/uL (ref 0.0–0.7)
EOS PCT: 2 % (ref 0–5)
HCT: 29.3 % — ABNORMAL LOW (ref 36.0–46.0)
Hemoglobin: 9.5 g/dL — ABNORMAL LOW (ref 12.0–15.0)
Lymphocytes Relative: 15 % (ref 12–46)
Lymphs Abs: 1 10*3/uL (ref 0.7–4.0)
MCH: 29.7 pg (ref 26.0–34.0)
MCHC: 32.4 g/dL (ref 30.0–36.0)
MCV: 91.6 fL (ref 78.0–100.0)
Monocytes Absolute: 0.4 10*3/uL (ref 0.1–1.0)
Monocytes Relative: 6 % (ref 3–12)
NEUTROS PCT: 77 % (ref 43–77)
Neutro Abs: 5.1 10*3/uL (ref 1.7–7.7)
PLATELETS: 296 10*3/uL (ref 150–400)
RBC: 3.2 MIL/uL — ABNORMAL LOW (ref 3.87–5.11)
RDW: 15.8 % — AB (ref 11.5–15.5)
WBC: 6.7 10*3/uL (ref 4.0–10.5)

## 2015-04-13 LAB — MAGNESIUM: MAGNESIUM: 2.3 mg/dL (ref 1.7–2.4)

## 2015-04-13 MED ORDER — DOXERCALCIFEROL 4 MCG/2ML IV SOLN
INTRAVENOUS | Status: AC
  Filled 2015-04-13: qty 4

## 2015-04-13 NOTE — Progress Notes (Signed)
[facility] Rounding Note Subjective:  Had dialysis yesterday with net of 4 liters off with weight down to 72.7  (dry weight 71.5) If weights are correct she has gained 2 kg overnight Energy Has 6 CUPS of ice/water at her bedside Advised nurses to PLEASE stop giving her liquids  Complains today that she has been "having her period" for 3 weeks  Objective Vital signs in last 24 hours: Filed Vitals:   04/13/15 0500 04/13/15 0545 04/13/15 0600 04/13/15 0809  BP: 141/48  123/49 129/48  Pulse:    79  Temp:    98.5 F (36.9 C)  TempSrc:    Oral  Resp: 20  21 20   Height:  5\' 3"  (1.6 m)    Weight:  74.1 kg (163 lb 5.8 oz)    SpO2:    100%   Weight change: 3.7 kg (8 lb 2.5 oz)  Intake/Output Summary (Last 24 hours) at 04/13/15 0858 Last data filed at 04/13/15 0300  Gross per 24 hour  Intake    603 ml  Output   4000 ml  Net  -3397 ml    Physical Exam:  BP 129/48 mmHg  Pulse 79  Temp(Src) 98.5 F (36.9 C) (Oral)  Resp 20  Ht 5\' 3"  (1.6 m)  Wt 74.1 kg (163 lb 5.8 oz)  BMI 28.95 kg/m2  SpO2 100%  LMP  (LMP Unknown) NAD Few base crackles right base mostly S1S2 No def S3 Abd soft, NT. Site of old renal allograft NT Trace edema LE's with excoriations from scratching on both legs  Labs:   Recent Labs Lab 04/11/15 0815 04/11/15 0950 04/11/15 1630 04/11/15 1710 04/11/15 1848 04/12/15 0230  NA 130*  --  136 135 133* 135  K >7.5* >7.5* <2.0* 4.1 4.4 4.8  CL 89*  --  91* 93* 92* 93*  CO2 23  --  30 28 29 30   GLUCOSE 181*  --  82 89 76 75  BUN 76*  --  <5* 17 20 30*  CREATININE 10.03*  --  1.02* 4.32* 4.63* 5.79*  CALCIUM 9.4  --  9.4 9.2 8.9 9.2  PHOS  --   --  <1.0*  --  6.0* 7.4*     Recent Labs Lab 04/11/15 0815  04/11/15 1710 04/11/15 1848 04/12/15 0230  AST 28  --  13*  --  10*  ALT 12*  --  9*  --  9*  ALKPHOS 57  --  60  --  49  BILITOT 1.0  --  0.7  --  0.7  PROT 7.3  --  9.3*  --  7.9  ALBUMIN 3.6  < > 3.8 3.6 3.3*  < > =  values in this interval not displayed. No results for input(s): LIPASE, AMYLASE in the last 168 hours. No results for input(s): AMMONIA in the last 168 hours.   Recent Labs Lab 04/11/15 0815 04/11/15 1710 04/12/15 0230  WBC 12.5* 9.2 7.0  NEUTROABS  --  7.7  --   HGB 9.0* 7.3* 8.3*  HCT 27.5* 22.2* 25.8*  MCV 90.8 89.9 91.5  PLT 227 298 271     Recent Labs Lab 04/11/15 1630 04/11/15 1710 04/11/15 2305  TROPONINI 0.05* 0.04* 0.05*     Recent Labs Lab 04/11/15 2308 04/12/15 0806 04/12/15 1155 04/12/15 2103 04/13/15 0811  GLUCAP 158* 104* 184* 173* 116*    Studies/Results: Dg Chest Port 1 View  04/13/2015   CLINICAL DATA:  Respiratory failure.  EXAM: PORTABLE CHEST - 1 VIEW  COMPARISON:  04/12/2015.  FINDINGS: Mediastinum and hilar structures stable. Stable cardiomegaly. Persistent unchanged bilateral pulmonary infiltrates consistent pulmonary edema again noted. Small right pleural effusion. No pneumothorax .  IMPRESSION: Cardiomegaly with persistent unchanged bilateral pulmonary edema. Small right pleural effusion.   Electronically Signed   By: Marcello Moores  Register   On: 04/13/2015 07:28   Dg Chest Port 1 View  04/12/2015   CLINICAL DATA:  Acute respiratory failure  EXAM: PORTABLE CHEST - 1 VIEW  COMPARISON:  04/11/2015  FINDINGS: Improved aeration with apical interstitial opacities decreased. Pleural fluid is similar in volume. Stable cardiomegaly. Stable aortic and hilar contours.  IMPRESSION: 1. Mild improvement in pulmonary edema. 2. Stable pleural effusions.   Electronically Signed   By: Monte Fantasia M.D.   On: 04/12/2015 06:21   Medications:   . aspirin EC  81 mg Oral QHS  . calcium acetate  2,001 mg Oral TID WC  . cinacalcet  30 mg Oral Q breakfast  . cloNIDine  0.1 mg Oral BID  . darbepoetin (ARANESP) injection - DIALYSIS  200 mcg Intravenous Q Tue-HD  . doxercalciferol  6 mcg Intravenous Q T,Th,Sa-HD  . escitalopram  10 mg Oral Daily  . heparin  5,000 Units  Subcutaneous 3 times per day  . insulin aspart  0-9 Units Subcutaneous TID WC  . insulin glargine  20 Units Subcutaneous QHS  . labetalol  300 mg Oral BID  . multivitamin  1 tablet Oral QHS  . sodium chloride  3 mL Intravenous Q12H   Dialysis Orders: TTS AF 4 hours 71.5kgs 2K/2.25 Ca AVG 2400u heparin Hectorol 6 micera 225 q 2 weeks (last dose given- 6/21 )  Assessment/Plan: 1. Respiratory distress/volume overload- bilat pleural effusions. high fluid gains- chronic problem. Uses home 02. Emergency HD on 7/5 -  4.6 liters removed. HD again yesterday 4 liters removed. Still have not gotten to EDW and she is gaining 2-3 kg overnight after each treatment. HD today to get back on schedule. Continue to reinforce the seriousness of this.She has enough fluid on her beside table right now for 2 days! Removed.  2. Life threatening hyperkalemia - post HD K's in the 4's. Resolved. 3. Leukocytosis- resolved. ATB's were d/c'd 4. ESRD - TTS AF- HD again today to get back on schedule. 5. Hypertension/volume - Significant volume excess/ chronic high gains- home meds of amlodipine and labetalol. PRN hydralazine 6. Anemia - hgb 9- due for micera today- gave Aranesp 200 on 7/5 7. Metabolic bone disease - cont sensipar, phoslo and hectorol 8. Nutrition - alb 3.6. Vitamin and supplements 9. Failed kidney/panc transplant- off meds 10. DM 11. Medical non-compliance - discussed again. She remains indifferent 12. Menorrhagia - "can you find me a gynecologist" - this is an outpt issue 37. Disposition - ? Home after HD?   Jamal Maes, MD [facility] [phone] pager 04/13/2015, 8:58 AM

## 2015-04-13 NOTE — Progress Notes (Signed)
Patient wants to go home and wants RN to call MD. Lilla Shook, MD notified that patient wants to go home because he has a 39 y/o son and husband goes to work in the am and nobody to watch the kid.MD is not discharging patient as of this moment.Per MD if patient insists on going home ,she will have to sign AMA. Suhaib Guzzo, Wonda Cheng, Therapist, sports

## 2015-04-13 NOTE — Progress Notes (Signed)
[facility] TEAM 1 - Stepdown/ICU TEAM Progress Note  NYRAH DEMOS HGD:[id number] DOB: 1976-06-21 DOA: 04/11/2015 PCP: Tommy Medal, MD  Admit HPI / Brief Narrative: 39 yo AF PMHx chronic noncompliance with medication, ESRD on HD T/Th/Sat  Hx kidney -pancreas transplant in 2010, last hospitalization from 10/31/2014 - 11/17/2014 (after cardiac arrest from hyperkalemia 8.12, left AMA 2/11), Controlled DM type 2, Retinopathy, ACD, Uncontrolled HTN, Malignant Ovarian Tumor in 2005.  Was in HD earlier today and was transferred to [facility] for continuation of HD due to high oxygen requirement (curerntly on BiPAP in [facility] ED). Please note that pt is somewhat restless and agitated, on BiPAP, able to answer simple questions but unable top provide full and detailed history. Pt reports cough, chills, chest tightness but again unable to fully provide details as she is visibly uncomfortable from dyspnea and with BiPAP.  Vital signs in ED notable for HR 90 - 110's with RR up to 30's bpm, O2 sat in 80's on arrival requiring placement on BiPAP, blood work notable for WBC 12.5, K > 7.5, BUN 76 and Cr 10, CXR c/w pulmonary vascular congestion. PCCM consulted and [facility] asked to admit for further evaluation. SDU bed requested.  HPI/Subjective:  7/7 A/O 4, NAD, flat affect. Lungs discussion with patient concerning CODE STATUS and her noncompliance with medication regimen. Patient still does not appear to understand that not compliant with medication regimen will result in her eventually coding again. Request to be continued as full code.  Assessment/Plan: Acute on Chronic respiratory failure with hypoxia (on 5 L O2 at home) - likely secondary to pulmonary vascular congestion, acute on chronic systolic and diastolic CHF, ?  - nephrology consulted for urgent HD secondary to noncompliance -Patient currently back to baseline chronic hypoxia; on 5 L O2 with SPO2= 100%  ESRD on hemodialysis T/Th/Sat  -Post dialysis patient gained weight  secondary to being provided with excessive amount of beverages by staff. -Strict I&O -7 L  -HD per nephrology  Acute hyperkalemia - On 7/6 within normal limit, trend   Sepsis - Patient no longer meets criteria for sepsis, most likely secondary to noncompliance with medication regimen resulting in pulmonary edema, tachycardia and stress Demargination  - Afebrile overnight.  - Antibiotics D/Ced on 7/6 -Continue to monitor  Acute on chronic combined systolic and diastolic CHF - per 2 D ECHO in 10/2014 EF 25 % and grade 2 diastolic CHF - repeat 2 D ECHO 11/2014 with recovered systolic function and EF near normal of 50% - HD for volume control   Diabetes mellitus controlled with ACD, retinopathy, gastroparesis  - 7/5 hemoglobin A1c = 6.5  - Continue Lantus at ~50% home dose (20 units daily) - Continue sensitive SSI  - resume Reglan once oral intake improves    Hypertensive urgency; secondary to noncompliance - Continue HD per nephrology; per nephrology note today appears questionable that patient can be discharged.  - continue Clonidine 0.1 mg BID -Continue labetalol 300 mg BID - Continue Hydralazine as needed for SBP > 155   Anemia of renal disease - no indication for transfusion at this time - CBC in AM   History of simultaneous kidney and pancreas transplant in 2010 - not on immunosuppressants per medical records    Anoxic encephalopathy - secondary to the above, respiratory failure from pulmonary vascular congestion  - monitor and treat supportively as noted above    Ovarian tumor, malignant - s/p resection in 9622   Metabolic bone disease  -last phos 8.6  and PTH 98 - cont sensipar, phoslo and hectorol   Code Status: FULL Family Communication: no family present at time of exam Disposition Plan: Per nephrology    Consultants: Dr.Cynthia Lorrene Reid (nephrology)    Procedure/Significant Events: 1/26/ echocardiogram;- Left ventricle: moderate LVH. LVEF=  25% to 30%. Diffuse hypokinesis.  -(grade 2 diastolic dysfunction).  - Right ventricle: Systolic function was severely reduced. - Pericardium, extracardiac: A trivial pericardial effusion 2/2 echocardiogram; Left ventricle: moderate LVH. LVEF= 50%-55%.    Culture   Antibiotics: Zosyn 7/5 > stopped 7/6 Vancomycin 7/5 > stopped 7/6   DVT prophylaxis:  Heparin subcutaneous   Devices    LINES / TUBES:      Continuous Infusions:   Objective: VITAL SIGNS: Temp: 98.3 F (36.8 C) (07/07 1138) Temp Source: Oral (07/07 1138) BP: 132/58 mmHg (07/07 1138) Pulse Rate: 83 (07/07 1138) SPO2; FIO2:   Intake/Output Summary (Last 24 hours) at 04/13/15 1140 Last data filed at 04/13/15 1031  Gross per 24 hour  Intake    706 ml  Output   4000 ml  Net  -3294 ml     Exam: General: A/O 4, NAD, No acute respiratory distress Eyes: Negative headache, eye pain, double vision, scotomas, floaters, negative retinal hemorrhage ENT: Negative Runny nose, negative ear pain, negative tinnitus, negative gingival bleeding Neck:  Negative scars, masses, torticollis, lymphadenopathy, JVD Lungs: Clear to auscultation bilaterally without wheezes or crackles Cardiovascular: Regular rate and rhythm without murmur gallop or rub normal S1 and S2 Abdomen:negative abdominal pain, negative dysphagia, Nontender, nondistended, soft, bowel sounds positive, no rebound, no ascites, no appreciable mass Extremities: No significant cyanosis, clubbing, or edema bilateral lower extremities Psychiatric:  Negative depression, negative anxiety, negative fatigue, negative mania, very poor understanding of her disease process and the ramifications of continued noncompliance.  Neurologic:  Cranial nerves II through XII intact, tongue/uvula midline, all extremities muscle strength 5/5, sensation intact throughout, negative dysarthria, negative expressive aphasia, negative receptive aphasia.    Data Reviewed: Basic  Metabolic Panel:  Recent Labs Lab 04/11/15 0815 04/11/15 0950 04/11/15 1630 04/11/15 1710 04/11/15 1848 04/12/15 0230  NA 130*  --  136 135 133* 135  K >7.5* >7.5* <2.0* 4.1 4.4 4.8  CL 89*  --  91* 93* 92* 93*  CO2 23  --  30 28 29 30   GLUCOSE 181*  --  82 89 76 75  BUN 76*  --  <5* 17 20 30*  CREATININE 10.03*  --  1.02* 4.32* 4.63* 5.79*  CALCIUM 9.4  --  9.4 9.2 8.9 9.2  MG 2.7*  --   --   --   --  2.3  PHOS  --   --  <1.0*  --  6.0* 7.4*   Liver Function Tests:  Recent Labs Lab 04/11/15 0815 04/11/15 1630 04/11/15 1710 04/11/15 1848 04/12/15 0230  AST 28  --  13*  --  10*  ALT 12*  --  9*  --  9*  ALKPHOS 57  --  60  --  49  BILITOT 1.0  --  0.7  --  0.7  PROT 7.3  --  9.3*  --  7.9  ALBUMIN 3.6 4.2 3.8 3.6 3.3*   No results for input(s): LIPASE, AMYLASE in the last 168 hours. No results for input(s): AMMONIA in the last 168 hours. CBC:  Recent Labs Lab 04/11/15 0815 04/11/15 1710 04/12/15 0230  WBC 12.5* 9.2 7.0  NEUTROABS  --  7.7  --   HGB 9.0*  7.3* 8.3*  HCT 27.5* 22.2* 25.8*  MCV 90.8 89.9 91.5  PLT 227 298 271   Cardiac Enzymes:  Recent Labs Lab 04/11/15 1630 04/11/15 1710 04/11/15 2305  TROPONINI 0.05* 0.04* 0.05*   BNP (last 3 results)  Recent Labs  04/11/15 0815  BNP 832.4*    ProBNP (last 3 results)  Recent Labs  04/18/14 1938  PROBNP >70000.0*    CBG:  Recent Labs Lab 04/11/15 2308 04/12/15 0806 04/12/15 1155 04/12/15 2103 04/13/15 0811  GLUCAP 158* 104* 184* 173* 116*    Recent Results (from the past 240 hour(s))  MRSA PCR Screening     Status: None   Collection Time: 04/11/15  4:36 PM  Result Value Ref Range Status   MRSA by PCR NEGATIVE NEGATIVE Final    Comment:        The GeneXpert MRSA Assay (FDA approved for NASAL specimens only), is one component of a comprehensive MRSA colonization surveillance program. It is not intended to diagnose MRSA infection nor to guide or monitor treatment  for MRSA infections.   Culture, blood (x 2)     Status: None (Preliminary result)   Collection Time: 04/11/15  5:00 PM  Result Value Ref Range Status   Specimen Description BLOOD RIGHT ARM  Final   Special Requests BOTTLES DRAWN AEROBIC AND ANAEROBIC 5CC  Final   Culture NO GROWTH < 24 HOURS  Final   Report Status PENDING  Incomplete  Culture, blood (x 2)     Status: None (Preliminary result)   Collection Time: 04/11/15  5:15 PM  Result Value Ref Range Status   Specimen Description BLOOD RIGHT ARM  Final   Special Requests BOTTLES DRAWN AEROBIC AND ANAEROBIC 5CC  Final   Culture NO GROWTH < 24 HOURS  Final   Report Status PENDING  Incomplete     Studies:  Recent x-ray studies have been reviewed in detail by the Attending Physician  Scheduled Meds:  Scheduled Meds: . aspirin EC  81 mg Oral QHS  . calcium acetate  2,001 mg Oral TID WC  . cinacalcet  30 mg Oral Q breakfast  . cloNIDine  0.1 mg Oral BID  . darbepoetin (ARANESP) injection - DIALYSIS  200 mcg Intravenous Q Tue-HD  . doxercalciferol  6 mcg Intravenous Q T,Th,Sa-HD  . escitalopram  10 mg Oral Daily  . heparin  5,000 Units Subcutaneous 3 times per day  . insulin aspart  0-9 Units Subcutaneous TID WC  . insulin glargine  20 Units Subcutaneous QHS  . labetalol  300 mg Oral BID  . multivitamin  1 tablet Oral QHS  . sodium chloride  3 mL Intravenous Q12H    Time spent on care of this patient: 40 mins   WOODS, Geraldo Docker , MD  [facility] Office  [phone] Pager [phone]  On-Call/Text Page:      [email or web address]      password [email or web address]  If 7PM-7AM, please contact night-coverage [email or web address] Password [email or web address] 04/13/2015, 11:40 AM   LOS: 2 days   Care during the described time interval was provided by me .  I have reviewed this patient's available data, including medical history, events of note, physical examination, and all test results as part of my evaluation. I have personally reviewed and interpreted  all radiology studies.   Dia Crawford, MD [phone] Pager

## 2015-04-13 NOTE — Progress Notes (Signed)
04/13/15 Patient insists on going home against medical advice,explained to patient that the MD is not discharging her and she is going against medical advice,patient advised that hospital is not liable if something happens to her as a result of her going home AMA,and that insurance may not pay for her hospitalization.Patient verbalized understanding.Husband came to pick  patient up. Jayvier Burgher, Wonda Cheng, Therapist, sports

## 2015-04-13 NOTE — Progress Notes (Addendum)
PULMONARY / CRITICAL CARE MEDICINE   Name: [name] MRN: [id number] DOB: 14-Jun-1976    ADMISSION DATE:  04/11/2015 CONSULTATION DATE:  04/11/15  REFERRING MD :  Dr. 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  Work up concerning for volume overload / HTN.   STUDIES:  7/05  PCXR >> bilateral pleural effusions, R>L  SIGNIFICANT EVENTS: 7/05  Admit with SOB, suspected volume overload.  PCCM consulted for resp distress.  To HD with 4.2 L removed 7/06  Acute respiratory issues resolved post HD.     SUBJECTIVE:  Pt denies complaints, feels much better. Hopeful to go home soon.     VITAL SIGNS: Temp:  [98.1 F (36.7 C)-98.5 F (36.9 C)] 98.3 F (36.8 C) (07/07 1138) Pulse Rate:  [73-102] 83 (07/07 1138) Resp:  [13-24] 16 (07/07 1138) BP: (111-161)/(41-77) 132/58 mmHg (07/07 1138) SpO2:  [100 %] 100 % (07/07 1138) Weight:  [160 lb 4.4 oz (72.7 kg)-169 lb 1.5 oz (76.7 kg)] 163 lb 5.8 oz (74.1 kg) (07/07 0545)   VENTILATOR SETTINGS:     INTAKE / OUTPUT:  Intake/Output Summary (Last 24 hours) at 04/13/15 1237 Last data filed at 04/13/15 1031  Gross per 24 hour  Intake    706 ml  Output   4000 ml  Net  -3294 ml    PHYSICAL EXAMINATION: General:  Chronically ill adult female in NAD  Neuro:  AAOx4, answers appropriately, MAE  HEENT:  MM pink/moist, JVD+ Cardiovascular:  s1s2 rrr, no m/r/g Lungs:  resp's even/non-labored, lungs bilaterally diminished posteriorly R>L Abdomen:  NTND, BSx4 active  Musculoskeletal:  No acute deformities  Skin:  Warm/dry, no edema, scattered LE abrasions  LABS:  CBC  Recent Labs Lab 04/11/15 0815 04/11/15 1710 04/12/15 0230  WBC 12.5* 9.2 7.0  HGB 9.0* 7.3* 8.3*  HCT 27.5* 22.2* 25.8*  PLT 227 298 271   Coag's  Recent Labs Lab 04/11/15 1710  APTT 36   INR 1.05    BMET  Recent Labs Lab 04/11/15 1710 04/11/15 1848 04/12/15 0230  NA 135 133* 135  K 4.1 4.4 4.8  CL 93* 92* 93*  CO2 28 29 30   BUN 17 20 30*  CREATININE 4.32* 4.63* 5.79*  GLUCOSE 89 76 75   Electrolytes  Recent Labs Lab 04/11/15 0815 04/11/15 1630 04/11/15 1710 04/11/15 1848 04/12/15 0230  CALCIUM 9.4 9.4 9.2 8.9 9.2  MG 2.7*  --   --   --  2.3  PHOS  --  <1.0*  --  6.0* 7.4*   Sepsis Markers  Recent Labs Lab 04/11/15 1710 04/11/15 1900  LATICACIDVEN 1.0 0.7  PROCALCITON 0.86  --     Liver Enzymes  Recent Labs Lab 04/11/15 0815  04/11/15 1710 04/11/15 1848 04/12/15 0230  AST 28  --  13*  --  10*  ALT 12*  --  9*  --  9*  ALKPHOS 57  --  60  --  49  BILITOT 1.0  --  0.7  --  0.7  ALBUMIN 3.6  < > 3.8 3.6 3.3*  < > = values in this interval not displayed.   Cardiac Enzymes  Recent Labs Lab 04/11/15 1630 04/11/15 1710 04/11/15 2305  TROPONINI 0.05* 0.04* 0.05*    Glucose  Recent  Labs Lab 04/11/15 2308 04/12/15 0806 04/12/15 1155 04/12/15 2103 04/13/15 0811 04/13/15 1140  GLUCAP 158* 104* 184* 173* 116* 157*    Imaging Dg Chest Port 1 View  04/13/2015   CLINICAL DATA:  Respiratory failure.  EXAM: PORTABLE CHEST - 1 VIEW  COMPARISON:  04/12/2015.  FINDINGS: Mediastinum and hilar structures stable. Stable cardiomegaly. Persistent unchanged bilateral pulmonary infiltrates consistent pulmonary edema again noted. Small right pleural effusion. No pneumothorax .  IMPRESSION: Cardiomegaly with persistent unchanged bilateral pulmonary edema. Small right pleural effusion.   Electronically Signed   By: Marcello Moores  Register   On: 04/13/2015 07:28     ASSESSMENT / PLAN:  PULMONARY OETT A: Acute on Chronic Hypoxemic Respiratory Failure - in setting of volume overload, acute issues resolved 7/6 Bilateral Pleural Effusions, R>L  Oxygen Dependent at Baseline, 2L - ? Unclear reasons, subjective report varies on how much O2 she is on (poor  historian) P:   BiPAP as needed for increased WOB Intermittent CXR  O2 to support sats > 90% Ambulate prior to discharge to assess actual O2 need ? If / when she will need outpatient pulmonary follow up (see Dr. Golden Pop notes from 7/7).  Feel acute issues are pulmonary edema in the setting of volume overload.    CARDIOVASCULAR A:  Hypertensive Urgency  HTN  Mild Troponin Leak - in setting of HTN'sive urgency, volume overload  P:  Continue ASA, clonidine, labetalol  PRN hydralazine  Tele monitoring  Trend EKG, troponin  RENAL A:   ESRD - on HD  Hyperkalemia Hx Failed Renal Transplant  P:   Trend BMP  HD per Nephrology  Replace electrolytes as needed  GASTROINTESTINAL A:   No acute issues  P:   Diet as tolerated PRN zofran   HEMATOLOGIC A:   Anemia of Chronic Disease  P:  Trend CBC  DVT Prop:  Heparin sq  INFECTIOUS A:   R/O Sepsis - leukocytosis P:   BCx2 7/5 >>   Vanco, start date 7/5, day 2/2 Zosyn, start date 7/5, day 2/2  D/C abx, monitor fever / wbc off   ENDOCRINE A:   DM II   P:   SSI     Noe Gens, NP-C [facility] Pgr: [phone] or if no answer [phone] 04/13/2015, 12:37 PM    STAFF NOTE: I, Merrie Roof, MD FACP have personally reviewed patient's available data, including medical history, events of note, physical examination and test results as part of my evaluation. I have discussed with resident/NP and other care providers such as pharmacist, RN and RRT. In addition, I personally evaluated patient and elicited key findings of: overloaded appearance in [place] / feb and now, pulme edema and effusions, seems noncompliance? ESRD in setting EF 25% , never to dry weight or euvolemic status, for HD today again, no role thora, likely all O2 needs are from overload / effusions, would continue to concentrate on neg balance as able, i see no reason for a further work p in hospital in regards to her O2 needs given  overwhelming evidence of edema as above, can follow up with Dr Chase Caller [phone]) post Discharge and assess O2 needs andf pcxr findings then  Will sign off    Lavon Paganini. Titus Mould, MD, [place] Pgr: [facility] 04/13/2015 2:10 PM

## 2015-04-13 NOTE — Procedures (Signed)
I have personally attended this patient's dialysis session.   Pre HD weight 72.7 standing  1.5 liters UF off with BP drop to 80's so may have finally reached EDW. K 4.5 2K bath  Jamal Maes, MD [facility] [phone] Pager 04/13/2015, 3:29 PM

## 2015-04-14 NOTE — Care Management (Signed)
Important Message  Patient Details  Name: [name] MRN: [id number] Date of Birth: [birth year]   Medicare Important Message Given:  Yes-second notification given    Nathen May 04/14/2015, 9:48 AM

## 2015-04-14 NOTE — Discharge Summary (Signed)
Physician Discharge Summary  [name] XTK:[id number] DOB: 10-24-1975 DOA: 04/11/2015  PCP: Tommy Medal, MD  Admit date: 04/11/2015 Discharge date: 04/14/2015  Time spent: 0 minutes  Recommendations for Outpatient Follow-up:  Acute on Chronic respiratory failure with hypoxia (on 5 L O2 at home) - likely secondary to pulmonary vascular congestion, acute on chronic systolic and diastolic CHF, ?  - nephrology consulted for urgent HD secondary to noncompliance -Patient currently back to baseline chronic hypoxia; on 5 L O2 with SPO2= 100% -Patient left AMA overnight  ESRD on hemodialysis T/Th/Sat  -Post dialysis patient gained weight secondary to being provided with excessive amount of beverages by staff. -Strict I&O -7 L  -HD per nephrology -Patient left AMA overnight  Acute hyperkalemia - On 7/6 within normal limit, trend  -Patient left AMA overnight  Sepsis - Patient no longer meets criteria for sepsis, most likely secondary to noncompliance with medication regimen resulting in pulmonary edema, tachycardia and stress Demargination  - Afebrile overnight.  - Antibiotics D/Ced on 7/6 -Continue to monitor -Patient left AMA overnight  Acute on chronic combined systolic and diastolic CHF - per 2 D ECHO in 10/2014 EF 25 % and grade 2 diastolic CHF - repeat 2 D ECHO 11/2014 with recovered systolic function and EF near normal of 50% - HD for volume control  -Patient left AMA overnight  Diabetes mellitus controlled with ACD, retinopathy, gastroparesis  - 7/5 hemoglobin A1c = 6.5  - Continue Lantus at ~50% home dose (20 units daily) - Continue sensitive SSI  - resume Reglan once oral intake improves  -Patient left AMA overnight   Hypertensive urgency; secondary to noncompliance - Continue HD per nephrology; per nephrology note today appears questionable that patient can be discharged.  - continue Clonidine 0.1 mg BID -Continue labetalol 300 mg BID - Continue Hydralazine as  needed for SBP > 155 -Patient left AMA overnight   Anemia of renal disease - no indication for transfusion at this time - CBC in AM -Patient left AMA overnight   History of simultaneous kidney and pancreas transplant in 2010 - not on immunosuppressants per medical records  -Patient left AMA overnight   Anoxic encephalopathy - secondary to the above, respiratory failure from pulmonary vascular congestion  - monitor and treat supportively as noted above  -Patient left AMA overnight   Ovarian tumor, malignant - s/p resection in 2005 -Patient left AMA overnight   Metabolic bone disease  -last phos 8.6 and PTH 98 - cont sensipar, phoslo and hectorol -Patient left AMA overnight    Discharge Diagnoses:  Principal Problem:   Acute respiratory failure with hypoxia Active Problems:   Anemia of renal disease   Ovarian tumor, malignant   ESRD on hemodialysis   History of simultaneous kidney and pancreas transplant   Anoxic encephalopathy   Acute hyperkalemia   Diabetes mellitus with multiple complications   Hypertensive urgency   Sepsis   Acute on chronic respiratory failure with hypoxia   End-stage renal disease on hemodialysis   Diabetes type 2, controlled   Diabetic gastroparesis   DM retinopathy   Noncompliance w/medication treatment due to intermit use of medication   Acute encephalopathy   Metabolic bone disease   Discharge Condition:   Diet recommendation:   Filed Weights   04/13/15 0545 04/13/15 1310 04/13/15 1710  Weight: 74.1 kg (163 lb 5.8 oz) 72.7 kg (160 lb 4.4 oz) 71.2 kg (156 lb 15.5 oz)    History of present illness:  39 yo AF PMHx  chronic noncompliance with medication, ESRD on HD T/Th/Sat  Hx kidney -pancreas transplant in 2010, last hospitalization from 10/31/2014 - 11/17/2014 (after cardiac arrest from hyperkalemia 8.12, left AMA 2/11), Controlled DM type 2, Retinopathy, ACD, Uncontrolled HTN, Malignant Ovarian Tumor in 2005.  Was in HD  earlier today and was transferred to [facility] for continuation of HD due to high oxygen requirement (curerntly on BiPAP in [facility] ED). Please note that pt is somewhat restless and agitated, on BiPAP, able to answer simple questions but unable top provide full and detailed history. Pt reports cough, chills, chest tightness but again unable to fully provide details as she is visibly uncomfortable from dyspnea and with BiPAP.  Vital signs in ED notable for HR 90 - 110's with RR up to 30's bpm, O2 sat in 80's on arrival requiring placement on BiPAP, blood work notable for WBC 12.5, K > 7.5, BUN 76 and Cr 10, CXR c/w pulmonary vascular congestion. PCCM consulted and TRH asked to admit for further evaluation. SDU bed requested.  Consultants: Dr.Cynthia Lorrene Reid (nephrology)    Procedure/Significant Events: 1/26/ echocardiogram;- Left ventricle: moderate LVH. LVEF= 25% to 30%. Diffuse hypokinesis.  -(grade 2 diastolic dysfunction).  - Right ventricle: Systolic function was severely reduced. - Pericardium, extracardiac: A trivial pericardial effusion 2/2 echocardiogram; Left ventricle: moderate LVH. LVEF= 50%-55%.    Culture   Antibiotics: Zosyn 7/5 > stopped 7/6 Vancomycin 7/5 > stopped 7/6    Discharge Exam: Filed Vitals:   04/13/15 1630 04/13/15 1710 04/13/15 1827 04/13/15 2100  BP: 109/64 152/72 136/70 155/88  Pulse: 89 84 82 84  Temp:  97 F (36.1 C) 97.7 F (36.5 C) 99.1 F (37.3 C)  TempSrc:  Oral Oral Oral  Resp: 22 19 19 18   Height:      Weight:  71.2 kg (156 lb 15.5 oz)    SpO2:  100% 100% 100%    General:  Cardiovascular:  Respiratory:   Discharge Instructions     Medication List    ASK your doctor about these medications        acetaminophen 500 MG tablet  Commonly known as:  TYLENOL  Take 1,000 mg by mouth every 6 (six) hours as needed for moderate pain.     amLODipine 10 MG tablet  Commonly known as:  NORVASC  Take 10 mg by mouth at bedtime.     aspirin EC 81  MG tablet  Take 81 mg by mouth at bedtime.     calcium acetate 667 MG capsule  Commonly known as:  PHOSLO  Take 3 capsules (2,001 mg total) by mouth 3 (three) times daily with meals.     cinacalcet 30 MG tablet  Commonly known as:  SENSIPAR  Take 30 mg by mouth daily.     cloNIDine 0.1 MG tablet  Commonly known as:  CATAPRES  Take 0.1 mg by mouth 2 (two) times daily.     diphenhydrAMINE 25 mg capsule  Commonly known as:  BENADRYL  Take 25 mg by mouth daily as needed for allergies.     escitalopram 20 MG tablet  Commonly known as:  LEXAPRO  Take 10 mg by mouth daily.     insulin aspart 100 UNIT/ML injection  Commonly known as:  novoLOG  Inject 12 Units into the skin 2 (two) times daily with breakfast and lunch.     insulin glargine 100 UNIT/ML injection  Commonly known as:  LANTUS  Inject 0.5 mLs (50 Units total) into the skin at bedtime.  labetalol 300 MG tablet  Commonly known as:  NORMODYNE  Take 300 mg by mouth 2 (two) times daily.     metoCLOPramide 5 MG tablet  Commonly known as:  REGLAN  Take 5 mg by mouth 4 (four) times daily.     oxyCODONE-acetaminophen 5-325 MG per tablet  Commonly known as:  PERCOCET/ROXICET  Take 1 tablet by mouth every 4 (four) hours as needed for moderate pain.     pantoprazole 40 MG tablet  Commonly known as:  PROTONIX  Take 40 mg by mouth daily.     RENA-VITE PO  Take 1 tablet by mouth daily.     simvastatin 20 MG tablet  Commonly known as:  ZOCOR  Take 20 mg by mouth every Monday, Wednesday, and Friday.       Allergies  Allergen Reactions  . Ace Inhibitors Cough      The results of significant diagnostics from this hospitalization (including imaging, microbiology, ancillary and laboratory) are listed below for reference.    Significant Diagnostic Studies: Dg Chest Port 1 View  04/13/2015   CLINICAL DATA:  Respiratory failure.  EXAM: PORTABLE CHEST - 1 VIEW  COMPARISON:  04/12/2015.  FINDINGS: Mediastinum and hilar  structures stable. Stable cardiomegaly. Persistent unchanged bilateral pulmonary infiltrates consistent pulmonary edema again noted. Small right pleural effusion. No pneumothorax .  IMPRESSION: Cardiomegaly with persistent unchanged bilateral pulmonary edema. Small right pleural effusion.   Electronically Signed   By: Marcello Moores  Register   On: 04/13/2015 07:28   Dg Chest Port 1 View  04/12/2015   CLINICAL DATA:  Acute respiratory failure  EXAM: PORTABLE CHEST - 1 VIEW  COMPARISON:  04/11/2015  FINDINGS: Improved aeration with apical interstitial opacities decreased. Pleural fluid is similar in volume. Stable cardiomegaly. Stable aortic and hilar contours.  IMPRESSION: 1. Mild improvement in pulmonary edema. 2. Stable pleural effusions.   Electronically Signed   By: Monte Fantasia M.D.   On: 04/12/2015 06:21   Dg Chest Port 1 View  04/11/2015   CLINICAL DATA:  Progressive shortness of breath. History of ovarian carcinoma.  EXAM: PORTABLE CHEST - 1 VIEW  COMPARISON:  November 16, 2014  FINDINGS: There is diffuse interstitial and patchy alveolar edema. There are bilateral effusions with cardiomegaly and pulmonary venous hypertension. No adenopathy is appreciable. No bone lesions.  IMPRESSION: Findings consistent with congestive heart failure. Superimposed pneumonia cannot be excluded radiographically.   Electronically Signed   By: Lowella Grip III M.D.   On: 04/11/2015 08:23    Microbiology: Recent Results (from the past 240 hour(s))  MRSA PCR Screening     Status: None   Collection Time: 04/11/15  4:36 PM  Result Value Ref Range Status   MRSA by PCR NEGATIVE NEGATIVE Final    Comment:        The GeneXpert MRSA Assay (FDA approved for NASAL specimens only), is one component of a comprehensive MRSA colonization surveillance program. It is not intended to diagnose MRSA infection nor to guide or monitor treatment for MRSA infections.   Culture, blood (x 2)     Status: None (Preliminary result)    Collection Time: 04/11/15  5:00 PM  Result Value Ref Range Status   Specimen Description BLOOD RIGHT ARM  Final   Special Requests BOTTLES DRAWN AEROBIC AND ANAEROBIC 5CC  Final   Culture NO GROWTH 2 DAYS  Final   Report Status PENDING  Incomplete  Culture, blood (x 2)     Status: None (Preliminary result)  Collection Time: 04/11/15  5:15 PM  Result Value Ref Range Status   Specimen Description BLOOD RIGHT ARM  Final   Special Requests BOTTLES DRAWN AEROBIC AND ANAEROBIC 5CC  Final   Culture NO GROWTH 2 DAYS  Final   Report Status PENDING  Incomplete     Labs: Basic Metabolic Panel:  Recent Labs Lab 04/11/15 0815  04/11/15 1630 04/11/15 1710 04/11/15 1848 04/12/15 0230 04/13/15 1217  NA 130*  --  136 135 133* 135 134*  K >7.5*  < > <2.0* 4.1 4.4 4.8 4.5  CL 89*  --  91* 93* 92* 93* 91*  CO2 23  --  30 28 29 30 30   GLUCOSE 181*  --  82 89 76 75 188*  BUN 76*  --  <5* 17 20 30* 27*  CREATININE 10.03*  --  1.02* 4.32* 4.63* 5.79* 5.33*  CALCIUM 9.4  --  9.4 9.2 8.9 9.2 10.3  MG 2.7*  --   --   --   --  2.3 2.3  PHOS  --   --  <1.0*  --  6.0* 7.4*  --   < > = values in this interval not displayed. Liver Function Tests:  Recent Labs Lab 04/11/15 0815 04/11/15 1630 04/11/15 1710 04/11/15 1848 04/12/15 0230  AST 28  --  13*  --  10*  ALT 12*  --  9*  --  9*  ALKPHOS 57  --  60  --  49  BILITOT 1.0  --  0.7  --  0.7  PROT 7.3  --  9.3*  --  7.9  ALBUMIN 3.6 4.2 3.8 3.6 3.3*   No results for input(s): LIPASE, AMYLASE in the last 168 hours. No results for input(s): AMMONIA in the last 168 hours. CBC:  Recent Labs Lab 04/11/15 0815 04/11/15 1710 04/12/15 0230 04/13/15 1217  WBC 12.5* 9.2 7.0 6.7  NEUTROABS  --  7.7  --  5.1  HGB 9.0* 7.3* 8.3* 9.5*  HCT 27.5* 22.2* 25.8* 29.3*  MCV 90.8 89.9 91.5 91.6  PLT 227 298 271 296   Cardiac Enzymes:  Recent Labs Lab 04/11/15 1630 04/11/15 1710 04/11/15 2305  TROPONINI 0.05* 0.04* 0.05*   BNP: BNP (last  3 results)  Recent Labs  04/11/15 0815  BNP 832.4*    ProBNP (last 3 results)  Recent Labs  04/18/14 1938  PROBNP >70000.0*    CBG:  Recent Labs Lab 04/12/15 2103 04/13/15 0811 04/13/15 1140 04/13/15 1823 04/13/15 2120  GLUCAP 173* 116* 157* 95 230*       Signed:  Dia Crawford, MD [facility] [phone] pager

## 2015-04-16 LAB — CULTURE, BLOOD (ROUTINE X 2)
CULTURE: NO GROWTH
Culture: NO GROWTH

## 2015-09-28 IMAGING — CR DG CHEST 1V PORT
1 series · 1 of 1 positions shown · non-contrast
Comparison: Prior chest x-ray 11/09/2014

CLINICAL DATA: 30-year-old female with shortness of breath

EXAM:
PORTABLE CHEST - 1 VIEW

[portable]
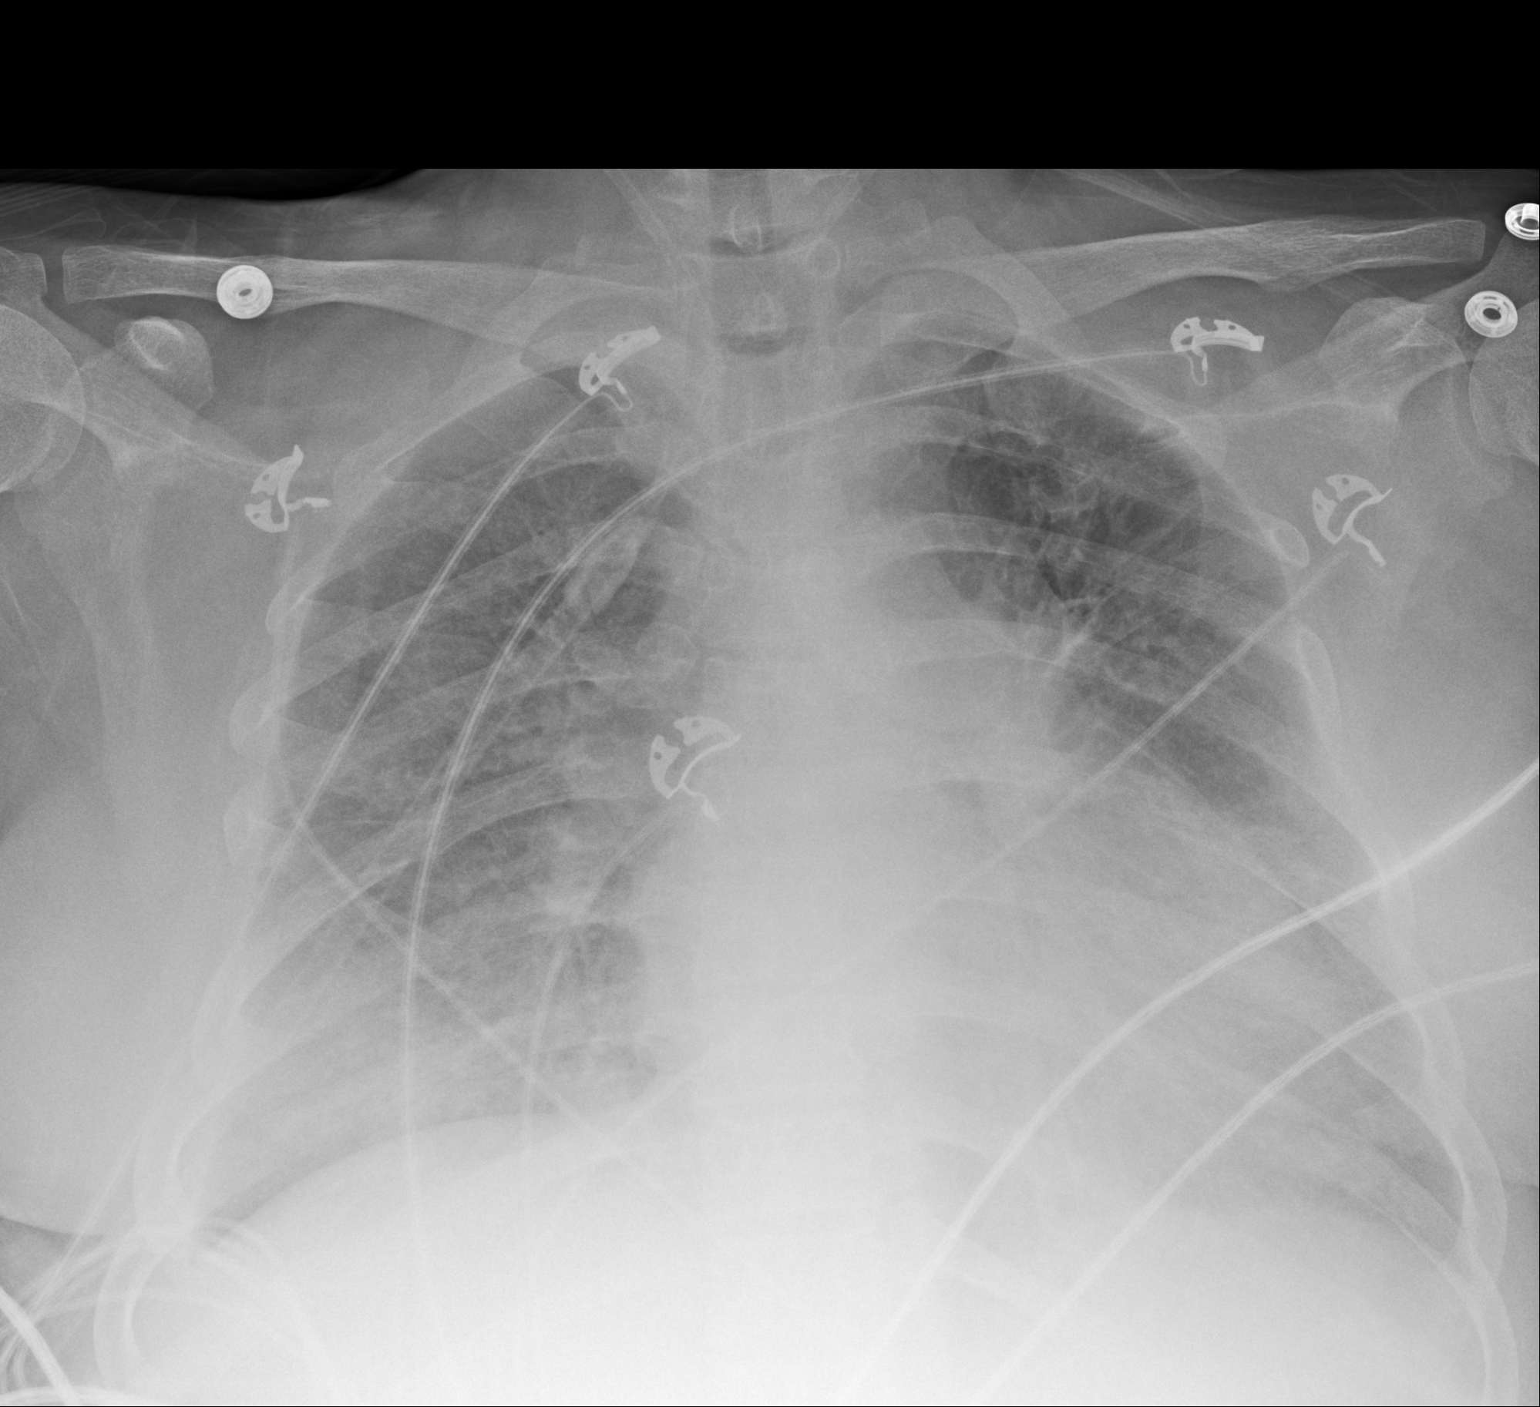

[1 of 1 positions shown; findings below may reference images not displayed]

FINDINGS: Cardiomegaly. Diffuse bilateral interstitial and airspace opacities
more confluent in the bases. Probable small right pleural effusion.
No pneumothorax. No acute osseous abnormality.
IMPRESSION: Cardiomegaly with moderate pulmonary edema and small right pleural
effusion. Findings suggest CHF versus volume overload.

## 2015-12-17 ENCOUNTER — Encounter (HOSPITAL_COMMUNITY): Payer: Self-pay | Admitting: *Deleted

## 2015-12-17 ENCOUNTER — Inpatient Hospital Stay (HOSPITAL_COMMUNITY)
Admission: EM | Admit: 2015-12-17 | Discharge: 2015-12-18 | DRG: 304 | Disposition: A | Discharge status: Home / Self Care | Payer: Medicare Other | Attending: Internal Medicine | Admitting: Internal Medicine

## 2015-12-17 ENCOUNTER — Emergency Department (HOSPITAL_COMMUNITY): Payer: Medicare Other

## 2015-12-17 DIAGNOSIS — N186 End stage renal disease: Secondary | ICD-10-CM

## 2015-12-17 DIAGNOSIS — E1165 Type 2 diabetes mellitus with hyperglycemia: Secondary | ICD-10-CM | POA: Diagnosis present

## 2015-12-17 DIAGNOSIS — R0602 Shortness of breath: Secondary | ICD-10-CM | POA: Diagnosis present

## 2015-12-17 DIAGNOSIS — E11319 Type 2 diabetes mellitus with unspecified diabetic retinopathy without macular edema: Secondary | ICD-10-CM | POA: Diagnosis present

## 2015-12-17 DIAGNOSIS — Z7982 Long term (current) use of aspirin: Secondary | ICD-10-CM

## 2015-12-17 DIAGNOSIS — I160 Hypertensive urgency: Secondary | ICD-10-CM | POA: Diagnosis present

## 2015-12-17 DIAGNOSIS — Z833 Family history of diabetes mellitus: Secondary | ICD-10-CM

## 2015-12-17 DIAGNOSIS — Z8543 Personal history of malignant neoplasm of ovary: Secondary | ICD-10-CM | POA: Diagnosis not present

## 2015-12-17 DIAGNOSIS — Z888 Allergy status to other drugs, medicaments and biological substances status: Secondary | ICD-10-CM

## 2015-12-17 DIAGNOSIS — Z8674 Personal history of sudden cardiac arrest: Secondary | ICD-10-CM | POA: Diagnosis not present

## 2015-12-17 DIAGNOSIS — J9621 Acute and chronic respiratory failure with hypoxia: Secondary | ICD-10-CM | POA: Diagnosis present

## 2015-12-17 DIAGNOSIS — Z9483 Pancreas transplant status: Secondary | ICD-10-CM | POA: Diagnosis not present

## 2015-12-17 DIAGNOSIS — J810 Acute pulmonary edema: Secondary | ICD-10-CM | POA: Diagnosis present

## 2015-12-17 DIAGNOSIS — F329 Major depressive disorder, single episode, unspecified: Secondary | ICD-10-CM | POA: Diagnosis present

## 2015-12-17 DIAGNOSIS — Z8611 Personal history of tuberculosis: Secondary | ICD-10-CM | POA: Diagnosis not present

## 2015-12-17 DIAGNOSIS — E875 Hyperkalemia: Secondary | ICD-10-CM | POA: Diagnosis present

## 2015-12-17 DIAGNOSIS — J9601 Acute respiratory failure with hypoxia: Secondary | ICD-10-CM

## 2015-12-17 DIAGNOSIS — D631 Anemia in chronic kidney disease: Secondary | ICD-10-CM | POA: Diagnosis present

## 2015-12-17 DIAGNOSIS — Z794 Long term (current) use of insulin: Secondary | ICD-10-CM

## 2015-12-17 DIAGNOSIS — Z800 Family history of malignant neoplasm of digestive organs: Secondary | ICD-10-CM

## 2015-12-17 DIAGNOSIS — K3184 Gastroparesis: Secondary | ICD-10-CM | POA: Diagnosis present

## 2015-12-17 DIAGNOSIS — E1122 Type 2 diabetes mellitus with diabetic chronic kidney disease: Secondary | ICD-10-CM | POA: Diagnosis present

## 2015-12-17 DIAGNOSIS — E1143 Type 2 diabetes mellitus with diabetic autonomic (poly)neuropathy: Secondary | ICD-10-CM | POA: Diagnosis present

## 2015-12-17 DIAGNOSIS — Z79899 Other long term (current) drug therapy: Secondary | ICD-10-CM

## 2015-12-17 DIAGNOSIS — Z9981 Dependence on supplemental oxygen: Secondary | ICD-10-CM | POA: Diagnosis not present

## 2015-12-17 DIAGNOSIS — I120 Hypertensive chronic kidney disease with stage 5 chronic kidney disease or end stage renal disease: Secondary | ICD-10-CM | POA: Diagnosis present

## 2015-12-17 DIAGNOSIS — E118 Type 2 diabetes mellitus with unspecified complications: Secondary | ICD-10-CM | POA: Diagnosis present

## 2015-12-17 DIAGNOSIS — Z9111 Patient's noncompliance with dietary regimen: Secondary | ICD-10-CM

## 2015-12-17 DIAGNOSIS — I161 Hypertensive emergency: Secondary | ICD-10-CM

## 2015-12-17 DIAGNOSIS — N2581 Secondary hyperparathyroidism of renal origin: Secondary | ICD-10-CM | POA: Diagnosis present

## 2015-12-17 DIAGNOSIS — K219 Gastro-esophageal reflux disease without esophagitis: Secondary | ICD-10-CM | POA: Diagnosis present

## 2015-12-17 DIAGNOSIS — Z940 Kidney transplant status: Secondary | ICD-10-CM | POA: Diagnosis not present

## 2015-12-17 DIAGNOSIS — Z992 Dependence on renal dialysis: Secondary | ICD-10-CM

## 2015-12-17 LAB — COMPREHENSIVE METABOLIC PANEL
ALBUMIN: 3.5 g/dL (ref 3.5–5.0)
ALT: 12 U/L — ABNORMAL LOW (ref 14–54)
AST: 17 U/L (ref 15–41)
Alkaline Phosphatase: 64 U/L (ref 38–126)
Anion gap: 19 — ABNORMAL HIGH (ref 5–15)
BILIRUBIN TOTAL: 0.5 mg/dL (ref 0.3–1.2)
BUN: 66 mg/dL — ABNORMAL HIGH (ref 6–20)
CO2: 21 mmol/L — ABNORMAL LOW (ref 22–32)
Calcium: 9.5 mg/dL (ref 8.9–10.3)
Chloride: 95 mmol/L — ABNORMAL LOW (ref 101–111)
Creatinine, Ser: 8.96 mg/dL — ABNORMAL HIGH (ref 0.44–1.00)
GFR, EST AFRICAN AMERICAN: 6 mL/min — AB (ref >60)
GFR, EST NON AFRICAN AMERICAN: 5 mL/min — AB (ref >60)
Glucose, Bld: 308 mg/dL — ABNORMAL HIGH (ref 65–99)
POTASSIUM: 6 mmol/L — AB (ref 3.5–5.1)
Sodium: 135 mmol/L (ref 135–145)
TOTAL PROTEIN: 7.8 g/dL (ref 6.5–8.1)

## 2015-12-17 LAB — I-STAT ARTERIAL BLOOD GAS, ED
Bicarbonate: 24.8 mEq/L — ABNORMAL HIGH (ref 20.0–24.0)
O2 Saturation: 70 %
PH ART: 7.397 (ref 7.350–7.450)
TCO2: 26 mmol/L (ref 0–100)
pCO2 arterial: 40.3 mmHg (ref 35.0–45.0)
pO2, Arterial: 37 mmHg — CL (ref 80.0–100.0)

## 2015-12-17 LAB — CBC WITH DIFFERENTIAL/PLATELET
BASOS PCT: 0 %
Basophils Absolute: 0 10*3/uL (ref 0.0–0.1)
Eosinophils Absolute: 0.2 10*3/uL (ref 0.0–0.7)
Eosinophils Relative: 2 %
HCT: 30.5 % — ABNORMAL LOW (ref 36.0–46.0)
Hemoglobin: 9.8 g/dL — ABNORMAL LOW (ref 12.0–15.0)
Lymphocytes Relative: 9 %
Lymphs Abs: 1 10*3/uL (ref 0.7–4.0)
MCH: 29.4 pg (ref 26.0–34.0)
MCHC: 32.1 g/dL (ref 30.0–36.0)
MCV: 91.6 fL (ref 78.0–100.0)
Monocytes Absolute: 0.4 10*3/uL (ref 0.1–1.0)
Monocytes Relative: 3 %
NEUTROS ABS: 9.8 10*3/uL — AB (ref 1.7–7.7)
Neutrophils Relative %: 86 %
Platelets: 234 10*3/uL (ref 150–400)
RBC: 3.33 MIL/uL — ABNORMAL LOW (ref 3.87–5.11)
RDW: 16.4 % — AB (ref 11.5–15.5)
WBC: 11.3 10*3/uL — ABNORMAL HIGH (ref 4.0–10.5)

## 2015-12-17 LAB — BRAIN NATRIURETIC PEPTIDE: B NATRIURETIC PEPTIDE 5: 1858.3 pg/mL — AB (ref 0.0–100.0)

## 2015-12-17 MED ORDER — ENOXAPARIN SODIUM 40 MG/0.4ML ~~LOC~~ SOLN: 40.0000 mg | SUBCUTANEOUS | Status: DC

## 2015-12-17 MED ORDER — INSULIN ASPART 100 UNIT/ML ~~LOC~~ SOLN
0.0000 [IU] | Freq: Three times a day (TID) | SUBCUTANEOUS | Status: DC
  Administered 2015-12-18: 1 [IU] via SUBCUTANEOUS

## 2015-12-17 MED ORDER — NITROGLYCERIN IN D5W 200-5 MCG/ML-% IV SOLN
INTRAVENOUS | Status: AC
  Filled 2015-12-17: qty 250

## 2015-12-17 MED ORDER — ESCITALOPRAM OXALATE 10 MG PO TABS
10.0000 mg | ORAL_TABLET | Freq: Every day | ORAL | Status: DC
Start: 2015-12-18 — End: 2015-12-19
  Administered 2015-12-18: 10 mg via ORAL
  Filled 2015-12-17: qty 1

## 2015-12-17 MED ORDER — SODIUM CHLORIDE 0.9% FLUSH: 3.0000 mL | Freq: Two times a day (BID) | INTRAVENOUS | Status: DC

## 2015-12-17 MED ORDER — LABETALOL HCL 300 MG PO TABS
300.0000 mg | ORAL_TABLET | Freq: Two times a day (BID) | ORAL | Status: DC
  Administered 2015-12-18 (×2): 300 mg via ORAL
  Filled 2015-12-17 (×2): qty 1

## 2015-12-17 MED ORDER — ACETAMINOPHEN 325 MG PO TABS: 650.0000 mg | ORAL_TABLET | ORAL | Status: DC | PRN

## 2015-12-17 MED ORDER — SODIUM CHLORIDE 0.9 % IV SOLN: 250.0000 mL | INTRAVENOUS | Status: DC | PRN

## 2015-12-17 MED ORDER — DIPHENHYDRAMINE HCL 25 MG PO CAPS
25.0000 mg | ORAL_CAPSULE | Freq: Every day | ORAL | Status: DC | PRN
  Administered 2015-12-18: 25 mg via ORAL
  Filled 2015-12-17: qty 1

## 2015-12-17 MED ORDER — AMLODIPINE BESYLATE 10 MG PO TABS
10.0000 mg | ORAL_TABLET | Freq: Every day | ORAL | Status: DC
  Administered 2015-12-18: 10 mg via ORAL
  Filled 2015-12-17: qty 1

## 2015-12-17 MED ORDER — ZOLPIDEM TARTRATE 5 MG PO TABS: 5.0000 mg | ORAL_TABLET | Freq: Every evening | ORAL | Status: DC | PRN

## 2015-12-17 MED ORDER — SODIUM CHLORIDE 0.9% FLUSH: 3.0000 mL | INTRAVENOUS | Status: DC | PRN

## 2015-12-17 MED ORDER — INSULIN ASPART 100 UNIT/ML ~~LOC~~ SOLN: 5.0000 [IU] | Freq: Once | SUBCUTANEOUS | Status: DC

## 2015-12-17 MED ORDER — INSULIN GLARGINE 100 UNIT/ML ~~LOC~~ SOLN
40.0000 [IU] | Freq: Every day | SUBCUTANEOUS | Status: DC
  Administered 2015-12-18: 40 [IU] via SUBCUTANEOUS
  Filled 2015-12-17 (×2): qty 0.4

## 2015-12-17 MED ORDER — RENA-VITE PO TABS: 1.0000 | ORAL_TABLET | Freq: Every day | ORAL | Status: DC

## 2015-12-17 MED ORDER — ASPIRIN EC 81 MG PO TBEC
81.0000 mg | DELAYED_RELEASE_TABLET | Freq: Every day | ORAL | Status: DC
  Administered 2015-12-18: 81 mg via ORAL
  Filled 2015-12-17: qty 1

## 2015-12-17 MED ORDER — CLONIDINE HCL 0.1 MG PO TABS
0.1000 mg | ORAL_TABLET | Freq: Two times a day (BID) | ORAL | Status: DC
  Administered 2015-12-18 (×2): 0.1 mg via ORAL
  Filled 2015-12-17 (×2): qty 1

## 2015-12-17 MED ORDER — PANTOPRAZOLE SODIUM 40 MG PO TBEC
40.0000 mg | DELAYED_RELEASE_TABLET | Freq: Every day | ORAL | Status: DC
  Administered 2015-12-18: 40 mg via ORAL
  Filled 2015-12-17: qty 1

## 2015-12-17 MED ORDER — DEXTROSE 50 % IV SOLN: 25.0000 mL | INTRAVENOUS | Status: DC | PRN

## 2015-12-17 MED ORDER — METOCLOPRAMIDE HCL 5 MG PO TABS
5.0000 mg | ORAL_TABLET | Freq: Four times a day (QID) | ORAL | Status: DC
  Administered 2015-12-18 (×2): 5 mg via ORAL
  Filled 2015-12-17 (×2): qty 1

## 2015-12-17 MED ORDER — NITROGLYCERIN IN D5W 200-5 MCG/ML-% IV SOLN: 50.0000 ug/min | INTRAVENOUS | Status: DC

## 2015-12-17 MED ORDER — NITROGLYCERIN IN D5W 200-5 MCG/ML-% IV SOLN
0.0000 ug/min | Freq: Once | INTRAVENOUS | Status: DC
  Administered 2015-12-17: 50 ug/min via INTRAVENOUS

## 2015-12-17 MED ORDER — CALCIUM ACETATE 667 MG PO CAPS: 2001.0000 mg | ORAL_CAPSULE | Freq: Three times a day (TID) | ORAL | Status: DC

## 2015-12-17 NOTE — Procedures (Signed)
Venous blood obtain on ABG attempt. Dr. Kathrynn Humble notified.

## 2015-12-17 NOTE — ED Notes (Signed)
Patient presents via EMS with CPAP in use.  Patient has been SOB all day but has increased  EMS noticed fine crackles in the bases and when they Placed the CPAP noticed wheezing and started Albuterol 5mg   Lasix 40mg  and NTG 4 tabs given by EMS

## 2015-12-17 NOTE — Procedures (Signed)
Changes made on bipap for pt's comfort.

## 2015-12-17 NOTE — H&P (Signed)
[facility] History and Physical  [name] [id number] DOB: 06/16/1976 DOA: 12/17/2015  Referring physician: ED physician PCP: Tommy Medal, MD  Specialists:   Chief Complaint: SOB  HPI: [name] is a 40 y.o. female with PMH of multiple and complex medical history including ESRD on HD, history of kidney -pancreas transplant in 2010, cardiac arrest due to hyperkalemia, retinopathy, uncontrolled HTN, DM, history of malignant ovarian tumor in 2005, GERD, depression, partially treated TB, anemia, who presents with shortness of breath.  Patient reports that she has been having shortness of breath for whole day, which has been progressively getting worse. She does not have chest pain, cough, fever or chills. Denies leg edema, abdominal pain, nausea, vomiting, diarrhea, symptoms of UTI or unilateral weakness.  In ED, patient was found to have elevated blood pressure at 215/87, tachycardia, tachypnea, temperature normal, WBC 7.3, BNP 1858, potassium 6.0 with T-wave peaking, creatinine 8.96. Chest x-ray showed vascular congestion with small amount of bilateral pleural effusion, and bilateral airspace opacification, likely reflecting mildly asymmetric pulmonary edema per radiologist. Patient is admitted to inpatient for further eval and treatment. Renal was consulted for urgent dialysis.  EKG: Independently reviewed. QTC 487, T-wave peaking in the 3-5, LAE, T-wave inversion in I/aVL.  Where does patient live?   At home Can patient participate in ADLs?  Yes   Review of Systems:   General: no fevers, chills, no changes in body weight, has fatigue HEENT: no blurry vision, hearing changes or sore throat Pulm: has dyspnea, no coughing, wheezing CV: no chest pain, no palpitations Abd: no nausea, vomiting, abdominal pain, diarrhea, constipation GU: no dysuria, burning on urination, increased urinary frequency, hematuria  Ext: no leg edema Neuro: no unilateral weakness, numbness, or tingling, no  vision change or hearing loss Skin: no rash MSK: No muscle spasm, no deformity, no limitation of range of movement in spin Heme: No easy bruising.  Travel history: No recent long distant travel.  Allergy:  Allergies  Allergen Reactions  . Ace Inhibitors Cough    Past Medical History  Diagnosis Date  . HTN (hypertension)     not well controlled  . 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 History of blood transfusion     "lots of times" (01/27/2013)  . CAP (community acquired pneumonia) 2010  . Type II diabetes mellitus ([place])   . Anemia   . Ovarian tumor, malignant ([place]) 2005    resected in [place]' pt denies that this was cancer on 01/27/2013  . Diabetic retinopathy ([place])   . On home oxygen therapy     "5L; 24/7" (01/25/2014)  . ESRD on hemodialysis ([place]) 08/25/2013    Started HD in 2008.  Had [place] transplant in 2010 at [facility].  Went back on HD in 2012.  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  The kidney was left in.  She now gets HD at [facility] on TTS schedule.     [name] History of simultaneous kidney and pancreas transplant ([place]) 04/19/2014    In 2010 at [facility].  Both organs have since rejected, see "ESRD" for details.   . ARF (acute respiratory failure) ([place]) 04/11/2015    with hypoxia    Past Surgical History  Procedure Laterality Date  . Tumor removal  2005    Ovarian, resected in [place]  . Av fistula placement  06/03/2012  Procedure: INSERTION OF ARTERIOVENOUS (AV) GORE-TEX GRAFT ARM;  Surgeon: Rosetta Posner, MD;  Location: [facility];  Service: Vascular;  Laterality: Left;  [name] Eye surgery Right     "cause of diabetes" (4/23/20140  . Combined kidney-pancreas transplant  2010    /notes 01/27/2013; @ [facility]  . 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 Renal biopsy  12/13/2010; 10/25/2011    Archie Endo  01/27/2013  . Tee without cardioversion N/A 04/22/2014    Procedure: TRANSESOPHAGEAL ECHOCARDIOGRAM (TEE);  Surgeon: Fay Records, MD;  Location: [facility] ENDOSCOPY;  Service: Cardiovascular;  Laterality: N/A;  [name]    Social History:  reports that she has never smoked. She has never used smokeless tobacco. She reports that she does not drink alcohol or use illicit drugs.  Family History:  Family History  Problem Relation Age of Onset  . Diabetes Mother   . Liver cancer Father   . Cancer Father      Prior to Admission medications   Medication Sig Start Date End Date Taking? Authorizing Provider  amLODipine (NORVASC) 10 MG tablet Take 10 mg by mouth at bedtime.    Yes Historical Provider, MD  aspirin EC 81 MG tablet Take 81 mg by mouth at bedtime.    Yes Historical Provider, MD  B Complex-C-Folic Acid (RENA-VITE PO) Take 1 tablet by mouth daily.   Yes Historical Provider, MD  calcium acetate (PHOSLO) 667 MG capsule Take 3 capsules (2,001 mg total) by mouth 3 (three) times daily with meals. 01/28/13  Yes Alric Seton, PA-C  cloNIDine (CATAPRES) 0.1 MG tablet Take 0.1 mg by mouth 2 (two) times daily.    Yes Historical Provider, MD  diphenhydrAMINE (BENADRYL) 25 mg capsule Take 25 mg by mouth daily as needed for allergies.   Yes Historical Provider, MD  escitalopram (LEXAPRO) 20 MG tablet Take 10 mg by mouth daily.    Yes Historical Provider, MD  insulin aspart (NOVOLOG) 100 UNIT/ML injection Inject 12 Units into the skin 2 (two) times daily with breakfast and lunch.    Yes Historical Provider, MD  insulin glargine (LANTUS) 100 UNIT/ML injection Inject 0.5 mLs (50 Units total) into the skin at bedtime. 04/23/14  Yes Delfina Redwood, MD  labetalol (NORMODYNE) 300 MG tablet Take 300 mg by mouth 2 (two) times daily.   Yes Historical Provider, MD  metoCLOPramide (REGLAN) 5 MG tablet Take 5 mg by mouth 4 (four) times daily.   Yes Historical Provider, MD  pantoprazole (PROTONIX) 40 MG tablet Take 40 mg  by mouth daily.   Yes Historical Provider, MD  oxyCODONE-acetaminophen (PERCOCET/ROXICET) 5-325 MG per tablet Take 1 tablet by mouth every 4 (four) hours as needed for moderate pain. Patient not taking: Reported on 12/17/2015 11/15/14   Donne Hazel, MD    Physical Exam: Filed Vitals:   12/17/15 2148 12/17/15 2348  BP: 215/87   Pulse: 116 117  Temp: 98.4 F (36.9 C)   TempSrc: Oral   Resp: 28 24  Height: 5\' 7"  (1.702 m)   Weight: 75 kg (165 lb 5.5 oz)   SpO2: 99% 98%   General: Not in acute distress HEENT:       Eyes: PERRL, EOMI, no scleral icterus.       ENT: No discharge from the ears and nose, no pharynx injection, no tonsillar enlargement.        Neck: No JVD, no bruit, no mass felt. Heme: No  neck lymph node enlargement. Cardiac: S1/S2, RRR, No murmurs, No gallops or rubs. Pulm: has rales bilaterally, no wheezing, rhonchi or rubs. Abd: Soft, nondistended, nontender, no rebound pain, no organomegaly, BS present. Ext: No pitting leg edema bilaterally. 2+DP/PT pulse bilaterally. Musculoskeletal: No joint deformities, No joint redness or warmth, no limitation of ROM in spin. Skin: No rashes.  Neuro: Alert, oriented X3, cranial nerves II-XII grossly intact, moves all extremities normally. Psych: Patient is not psychotic, no suicidal or hemocidal ideation.  Labs on Admission:  Basic Metabolic Panel:  Recent Labs Lab 12/17/15 2137  NA 135  K 6.0*  CL 95*  CO2 21*  GLUCOSE 308*  BUN 66*  CREATININE 8.96*  CALCIUM 9.5   Liver Function Tests:  Recent Labs Lab 12/17/15 2137  AST 17  ALT 12*  ALKPHOS 64  BILITOT 0.5  PROT 7.8  ALBUMIN 3.5   No results for input(s): LIPASE, AMYLASE in the last 168 hours. No results for input(s): AMMONIA in the last 168 hours. CBC:  Recent Labs Lab 12/17/15 2137  WBC 11.3*  NEUTROABS 9.8*  HGB 9.8*  HCT 30.5*  MCV 91.6  PLT 234   Cardiac Enzymes: No results for input(s): CKTOTAL, CKMB, CKMBINDEX, TROPONINI in the last  168 hours.  BNP (last 3 results)  Recent Labs  04/11/15 0815 12/17/15 2137  BNP 832.4* 1858.3*    ProBNP (last 3 results) No results for input(s): PROBNP in the last 8760 hours.  CBG: No results for input(s): GLUCAP in the last 168 hours.  Radiological Exams on Admission: Dg Chest Port 1 View  12/17/2015  CLINICAL DATA:  Acute onset of shortness of breath. Initial encounter. EXAM: PORTABLE CHEST 1 VIEW COMPARISON:  Chest radiograph performed 04/13/2015 FINDINGS: The lungs are well-aerated. Vascular congestion is noted. Small bilateral pleural effusions are seen, right greater than left, with bilateral airspace opacification, likely reflecting mildly asymmetric pulmonary edema. No pneumothorax is seen. The cardiomediastinal silhouette is enlarged. No acute osseous abnormalities are seen. IMPRESSION: Vascular congestion and cardiomegaly. Small bilateral pleural effusions, right greater than left, with bilateral airspace opacification, likely reflecting mildly asymmetric pulmonary edema. Electronically Signed   By: Garald Balding M.D.   On: 12/17/2015 21:47    Assessment/Plan Principal Problem:   Acute on chronic respiratory failure with hypoxia (HCC) Active Problems:   ESRD on hemodialysis (HCC)   Hyperkalemia   History of simultaneous kidney and pancreas transplant ([place])   Diabetes mellitus with multiple complications (HCC)   Hypertensive urgency   Diabetic gastroparesis (HCC)   Flash pulmonary edema (HCC)  Acute on chronic respiratory failure with hypoxia due to flash pulmonary edema 2/2 hypertensive urgency: Patient's blood pressure is elevated at 215/87. She has vascular congestion on chest x-ray and bilateral crackles on lung auscultation, consistent with pulmonary edema. Chest x-ray showed bilateral opacity, but the patient does not have cough, fever, chills, chest pain, less likely to have a pneumonia.  -will admit to tele bed after urgent HD -Renal was consulted for urgent  dialysis -Nitroglycerin drip -trop x 3 -2d echo -will continue home labetalol and ASA -Daily weights -strict I/O's -Low salt diet -prn BiAP  Hypertensive urgency: -IV nitroglycerin drip -Amlodipine, clonidine, labetalol  flash pulmonary edema and CHF: 2-D echo on 11/08/14 showed EF 50-55 percent, now has flush of pulmonary edema. -see above  ESRD on hemodialysis (MWF): had dialysis on Friday. Potassium 6.0, creatinine 8.96, BUN 86. -renal was consulted for urgent dialysis -Rena-vite, phoslo.  GERD: -Protonix  Hyperkalemia: K=6.0 with  T peaking in V3-V5 -5 U of novolog-->then urgent dialysis. -her CBG is 308, does not need D50 when giving insulin  DM-II: Last A1c 6.5 on 04/11/15, well controled. Patient is taking Lantus and at home -will decrease Lantus dose from 50-40 units daily  -SSI  DVT ppx: SQ Lovenox  Code Status: Full code Family Communication: None at bed side.  Disposition Plan: Admit to inpatient   Date of Service 12/18/2015    Ivor Costa [facility] Pager [phone]  If 7PM-7AM, please contact night-coverage [email or web address] Password [facility] 12/18/2015, 12:09 AM

## 2015-12-17 NOTE — ED Notes (Addendum)
Report given to [facility] , advised nurse that pt. can go to hemodialysis unit  . Pt. resting with no distress , respirations unlabored . NTG drip infusing at 100 mcg/min, iv site unremarkable .

## 2015-12-18 ENCOUNTER — Encounter (HOSPITAL_COMMUNITY): Payer: Self-pay | Admitting: General Practice

## 2015-12-18 ENCOUNTER — Other Ambulatory Visit (HOSPITAL_COMMUNITY): Payer: Medicare Other

## 2015-12-18 DIAGNOSIS — E875 Hyperkalemia: Secondary | ICD-10-CM

## 2015-12-18 DIAGNOSIS — E118 Type 2 diabetes mellitus with unspecified complications: Secondary | ICD-10-CM

## 2015-12-18 DIAGNOSIS — J810 Acute pulmonary edema: Secondary | ICD-10-CM | POA: Diagnosis present

## 2015-12-18 DIAGNOSIS — N186 End stage renal disease: Secondary | ICD-10-CM

## 2015-12-18 DIAGNOSIS — Z992 Dependence on renal dialysis: Secondary | ICD-10-CM

## 2015-12-18 DIAGNOSIS — J9621 Acute and chronic respiratory failure with hypoxia: Secondary | ICD-10-CM

## 2015-12-18 LAB — CBC
HCT: 31.1 % — ABNORMAL LOW (ref 36.0–46.0)
Hemoglobin: 9.8 g/dL — ABNORMAL LOW (ref 12.0–15.0)
MCH: 29.3 pg (ref 26.0–34.0)
MCHC: 31.5 g/dL (ref 30.0–36.0)
MCV: 92.8 fL (ref 78.0–100.0)
Platelets: 231 K/uL (ref 150–400)
RBC: 3.35 MIL/uL — ABNORMAL LOW (ref 3.87–5.11)
RDW: 16.4 % — ABNORMAL HIGH (ref 11.5–15.5)
WBC: 8.5 K/uL (ref 4.0–10.5)

## 2015-12-18 LAB — TROPONIN I
Troponin I: 0.05 ng/mL — ABNORMAL HIGH (ref <0.031)
Troponin I: 0.09 ng/mL — ABNORMAL HIGH (ref <0.031)

## 2015-12-18 LAB — RENAL FUNCTION PANEL
Albumin: 3.6 g/dL (ref 3.5–5.0)
Anion gap: 15 (ref 5–15)
BUN: 13 mg/dL (ref 6–20)
CO2: 28 mmol/L (ref 22–32)
Calcium: 8.9 mg/dL (ref 8.9–10.3)
Chloride: 92 mmol/L — ABNORMAL LOW (ref 101–111)
Creatinine, Ser: 3.28 mg/dL — ABNORMAL HIGH (ref 0.44–1.00)
GFR calc Af Amer: 19 mL/min — ABNORMAL LOW (ref >60)
GFR calc non Af Amer: 17 mL/min — ABNORMAL LOW (ref >60)
Glucose, Bld: 202 mg/dL — ABNORMAL HIGH (ref 65–99)
Phosphorus: 4.5 mg/dL (ref 2.5–4.6)
Potassium: 4.4 mmol/L (ref 3.5–5.1)
Sodium: 135 mmol/L (ref 135–145)

## 2015-12-18 LAB — BASIC METABOLIC PANEL
ANION GAP: 14 (ref 5–15)
BUN: 23 mg/dL — ABNORMAL HIGH (ref 6–20)
CHLORIDE: 95 mmol/L — AB (ref 101–111)
CO2: 29 mmol/L (ref 22–32)
Calcium: 9.8 mg/dL (ref 8.9–10.3)
Creatinine, Ser: 4.47 mg/dL — ABNORMAL HIGH (ref 0.44–1.00)
GFR calc non Af Amer: 11 mL/min — ABNORMAL LOW (ref >60)
GFR, EST AFRICAN AMERICAN: 13 mL/min — AB (ref >60)
Glucose, Bld: 111 mg/dL — ABNORMAL HIGH (ref 65–99)
POTASSIUM: 4.2 mmol/L (ref 3.5–5.1)
SODIUM: 138 mmol/L (ref 135–145)

## 2015-12-18 LAB — GLUCOSE, CAPILLARY
GLUCOSE-CAPILLARY: 123 mg/dL — AB (ref 65–99)
Glucose-Capillary: 111 mg/dL — ABNORMAL HIGH (ref 65–99)
Glucose-Capillary: 107 mg/dL — ABNORMAL HIGH (ref 65–99)
Glucose-Capillary: 219 mg/dL — ABNORMAL HIGH (ref 65–99)

## 2015-12-18 LAB — SURGICAL PCR SCREEN
MRSA, PCR: NEGATIVE
STAPHYLOCOCCUS AUREUS: POSITIVE — AB

## 2015-12-18 LAB — PROTIME-INR
INR: 0.95 (ref 0.00–1.49)
Prothrombin Time: 12.9 seconds (ref 11.6–15.2)

## 2015-12-18 LAB — APTT: aPTT: 38 seconds — ABNORMAL HIGH (ref 24–37)

## 2015-12-18 MED ORDER — ALTEPLASE 2 MG IJ SOLR
2.0000 mg | Freq: Once | INTRAMUSCULAR | Status: DC | PRN
  Filled 2015-12-18: qty 2

## 2015-12-18 MED ORDER — RENA-VITE PO TABS
1.0000 | ORAL_TABLET | Freq: Every day | ORAL | Status: DC
  Administered 2015-12-18: 1 via ORAL
  Filled 2015-12-18: qty 1

## 2015-12-18 MED ORDER — LIDOCAINE HCL (PF) 1 % IJ SOLN: 5.0000 mL | INTRAMUSCULAR | Status: DC | PRN

## 2015-12-18 MED ORDER — CALCIUM ACETATE (PHOS BINDER) 667 MG PO CAPS
2001.0000 mg | ORAL_CAPSULE | Freq: Three times a day (TID) | ORAL | Status: DC
Start: 2015-12-18 — End: 2015-12-19
  Filled 2015-12-18: qty 3

## 2015-12-18 MED ORDER — LIDOCAINE HCL (PF) 1 % IJ SOLN
5.0000 mL | INTRAMUSCULAR | Status: DC | PRN
Start: 2015-12-18 — End: 2015-12-18

## 2015-12-18 MED ORDER — PENTAFLUOROPROP-TETRAFLUOROETH EX AERO: 1.0000 "application " | INHALATION_SPRAY | CUTANEOUS | Status: DC | PRN

## 2015-12-18 MED ORDER — LABETALOL HCL 5 MG/ML IV SOLN
10.0000 mg | INTRAVENOUS | Status: DC | PRN
  Administered 2015-12-18: 10 mg via INTRAVENOUS
  Filled 2015-12-18: qty 4

## 2015-12-18 MED ORDER — ENOXAPARIN SODIUM 30 MG/0.3ML ~~LOC~~ SOLN: 30.0000 mg | SUBCUTANEOUS | Status: DC

## 2015-12-18 MED ORDER — DOXERCALCIFEROL 4 MCG/2ML IV SOLN
4.0000 ug | INTRAVENOUS | Status: DC
  Filled 2015-12-18: qty 2

## 2015-12-18 MED ORDER — HEPARIN SODIUM (PORCINE) 1000 UNIT/ML DIALYSIS: 2000.0000 [IU] | Freq: Once | INTRAMUSCULAR | Status: DC

## 2015-12-18 MED ORDER — HEPARIN SODIUM (PORCINE) 1000 UNIT/ML DIALYSIS: 2400.0000 [IU] | Freq: Once | INTRAMUSCULAR | Status: DC

## 2015-12-18 MED ORDER — SODIUM CHLORIDE 0.9 % IV SOLN: 100.0000 mL | INTRAVENOUS | Status: DC | PRN

## 2015-12-18 MED ORDER — LIDOCAINE-PRILOCAINE 2.5-2.5 % EX CREA
1.0000 | TOPICAL_CREAM | CUTANEOUS | Status: DC | PRN
Start: 2015-12-18 — End: 2015-12-18

## 2015-12-18 MED ORDER — LIDOCAINE-PRILOCAINE 2.5-2.5 % EX CREA
1.0000 "application " | TOPICAL_CREAM | CUTANEOUS | Status: DC | PRN
  Filled 2015-12-18: qty 5

## 2015-12-18 MED ORDER — HEPARIN SODIUM (PORCINE) 1000 UNIT/ML DIALYSIS: 1000.0000 [IU] | INTRAMUSCULAR | Status: DC | PRN

## 2015-12-18 NOTE — Progress Notes (Signed)
Dr. Mercy Moore on call for [facility] - explained lab draw and d/c delay - instructed this RN to call Dr. Sloan Leiter. Dr. Sloan Leiter responded promptly to tx page - instructed to d/c patient home now.

## 2015-12-18 NOTE — Discharge Summary (Signed)
Physician Discharge Summary  [name] [id number] DOB: September 04, 1976 DOA: 12/17/2015  PCP: Tommy Medal, MD  Admit date: 12/17/2015 Discharge date: 12/18/2015  Time spent: 30 minutes  Recommendations for Outpatient Follow-up:  1. Follow up with PCP in one week 2. 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 female with a complex PMH who presented with shortness of breath on 12/17/2015. In ED, patient had elevated blood pressure at 215/87, tachycardia, tachypnea, afebrile, WBC 11.3, BNP 1858, potassium 6.0 with T-wave peaking, creatinine 8.96. CXR showed mildly asymmetric pulmonary edema per radiologist. Patient was admitted to inpatient for further eval and treatment. Renal was consulted for urgent dialysis.  Hospital Course:   Acute on chronic respiratory failure with hypoxia due to non cardiogenic pulmonary edema: Resolved with HD.This was secondary to d/t flash pulmonary edema resolving with HD x 2.Likely due to non compliance with Diet.On home O2  Hyperkalemia:secondary to ESRD-suspect dietary non compliance. Resolved with HD.  CHF: 2-D echo on 11/08/2014 showed LVEF 50-55%. Clinically compensated following urgent HD. However pul edema was likely non cardiogenic in a setting of HD-non compliance with diet.   Hypertensive emergency: Better with HD x 2  and nitroglycerin drip, clonidine, labetalol and amlodipine this hospital stay. Continue at-home meds on discharge.  ESRD on hemodialysis: HD MWF at [facility]. History of hyperkalemia. Currently K+4.2. Use 2.0 K bath per Nephrology.   Diabetes mellitus with multiple complications: Last 123456 6.5 on 04/11/2015. Admitted with hyperglycemia, but well controlled otherwise. Controlled on SSI during this hospital stay. Resume Lantus on discharge.  Anemia: Secondary to CKD. HGB 9.8 12/17/2015. Per renal, last ESA dose 12/08/15 due for dose this week.   Secondary hyperparathyroidism: continue per renal  Diabetic gastroparesis: Continue metoclopramide   Procedures:  2x Hemodialysis 12/18/2015: [id number], 1101  Consultations:  Nephrology  Discharge Exam: Filed Vitals:   12/18/15 1318 12/18/15 1323  BP: 180/79 169/79  Pulse: 80 84  Temp: 98 F (36.7 C)   Resp: 20 20    General: Lethargic, attentive to questioning but keeps eyes closed. Cardiovascular: RRR no M/R/C/G. No LE edema.  Respiratory: Bibasilar crackles but vesicular sounds otherwise. No wheezes or rhonchi.   Discharge Instructions   Discharge Instructions    Call MD for:  difficulty breathing, headache or visual disturbances    Complete by:  As directed      Call MD for:  persistant nausea and vomiting    Complete by:  As directed      Call MD for:  severe uncontrolled pain    Complete by:  As directed      Call MD for:  temperature >100.4    Complete by:  As directed      Diet - low sodium heart healthy    Complete by:  As directed      Increase activity  slowly    Complete by:  As directed           Current Discharge Medication List    CONTINUE these medications which have NOT CHANGED   Details  amLODipine (NORVASC) 10 MG tablet Take 10 mg by mouth at bedtime.     aspirin EC 81 MG tablet Take 81 mg by mouth at bedtime.     B Complex-C-Folic Acid (RENA-VITE PO) Take 1 tablet by mouth daily.    calcium acetate (PHOSLO)  667 MG capsule Take 3 capsules (2,001 mg total) by mouth 3 (three) times daily with meals.    cloNIDine (CATAPRES) 0.1 MG tablet Take 0.1 mg by mouth 2 (two) times daily.     diphenhydrAMINE (BENADRYL) 25 mg capsule Take 25 mg by mouth daily as needed for allergies.    escitalopram (LEXAPRO) 20 MG tablet Take 10 mg by mouth daily.     insulin aspart (NOVOLOG) 100 UNIT/ML injection Inject 12 Units into the skin 2 (two) times daily with breakfast and lunch.     insulin glargine (LANTUS) 100 UNIT/ML injection Inject 0.5 mLs (50 Units total) into the skin at bedtime. Qty: 10 mL, Refills: 11    labetalol (NORMODYNE) 300 MG tablet Take 300 mg by mouth 2 (two) times daily.    metoCLOPramide (REGLAN) 5 MG tablet Take 5 mg by mouth 4 (four) times daily.    pantoprazole (PROTONIX) 40 MG tablet Take 40 mg by mouth daily.    oxyCODONE-acetaminophen (PERCOCET/ROXICET) 5-325 MG per tablet Take 1 tablet by mouth every 4 (four) hours as needed for moderate pain. Qty: 30 tablet, Refills: 0       Allergies  Allergen Reactions  . Ace Inhibitors Cough   Follow-up Information    Follow up with PANG,RICHARD, MD In 1 week.   Specialty:  Internal Medicine   Contact information:   [address] [place] [address] [phone]       Please follow up.   Contact information:   Hemodialysis Unit-Monday, Wed and Thursday       The results of significant diagnostics from this hospitalization (including imaging, microbiology, ancillary and laboratory) are listed below for reference.    Significant Diagnostic Studies: Dg Chest Port 1 View  12/17/2015  CLINICAL DATA:  Acute onset of shortness of breath. Initial encounter. EXAM: PORTABLE CHEST 1 VIEW COMPARISON:  Chest radiograph performed 04/13/2015 FINDINGS: The lungs are well-aerated. Vascular congestion is noted. Small bilateral pleural effusions are seen, right greater than left, with bilateral airspace opacification, likely  reflecting mildly asymmetric pulmonary edema. No pneumothorax is seen. The cardiomediastinal silhouette is enlarged. No acute osseous abnormalities are seen. IMPRESSION: Vascular congestion and cardiomegaly. Small bilateral pleural effusions, right greater than left, with bilateral airspace opacification, likely reflecting mildly asymmetric pulmonary edema. Electronically Signed   By: Garald Balding M.D.   On: 12/17/2015 21:47    Microbiology: No results found for this or any previous visit (from the past 240 hour(s)).   Labs: Basic Metabolic Panel:  Recent Labs Lab 12/17/15 2137 12/18/15 0550  NA 135 138  K 6.0* 4.2  CL 95* 95*  CO2 21* 29  GLUCOSE 308* 111*  BUN 66* 23*  CREATININE 8.96* 4.47*  CALCIUM 9.5 9.8   Liver Function Tests:  Recent Labs Lab 12/17/15 2137  AST 17  ALT 12*  ALKPHOS 64  BILITOT 0.5  PROT 7.8  ALBUMIN 3.5   No results for input(s): LIPASE, AMYLASE in the last 168 hours.  No results for input(s): AMMONIA in the last 168 hours. CBC:  Recent Labs Lab 12/17/15 2137  WBC 11.3*  NEUTROABS 9.8*  HGB 9.8*  HCT 30.5*  MCV 91.6  PLT 234   Cardiac Enzymes:  Recent Labs Lab 12/18/15 0550 12/18/15 1147  TROPONINI 0.05* 0.09*   BNP: BNP (last 3 results)  Recent Labs  04/11/15 0815 12/17/15 2137  BNP 832.4* 1858.3*    ProBNP (last 3 results) No results for input(s): PROBNP in the last 8760 hours.  CBG:  Recent Labs Lab 12/18/15 0608 12/18/15 1134  GLUCAP 123* 111*    SignedOren Binet MD.  [facility] 12/18/2015, 2:02 PM

## 2015-12-18 NOTE — Progress Notes (Signed)
Patient's BP 202/83, no prn available, MD on call notified, 10 mg labetalol iv Q2H prn ordered, orders followed through patients BP now 182/73.

## 2015-12-18 NOTE — Progress Notes (Signed)
Received call from dialysis - provided update report. BP currently 153/59.

## 2015-12-18 NOTE — Progress Notes (Signed)
Dr. Sloan Leiter responded promptly to tx page - requested I direct question to Dr. Florene Glen, Nephrology r/t lab draw before her d/c home.

## 2015-12-18 NOTE — Progress Notes (Signed)
Patient arrived on [address] [place] with a non re breather, rapid called and assessed, patient saO2 on room air was 95%, patient taken off of non re breather. Patient stated, " I get short of breath when I lay down", RN placed pt on 2 L of oxygen for comfort. Patient stated she uses 5 L at home. Patient oriented to the unit and placed on tele.  Sharene Skeans, RN BSN

## 2015-12-18 NOTE — ED Notes (Signed)
pts nose is running

## 2015-12-18 NOTE — Progress Notes (Signed)
[facility] Progress Note  Assessment/Plan: 1.  Acute on chronic hypoxic respiratory failure: Per primary, off BIPAP, now on nasal cannula, O2 sats 96-100%. Had emergent HD last night.  2. Hypertensive Urgency: Per primary  3. ESRD -MWF at [facility]. History of hyperkalemia, uses 1.0 K bath. Currently K+4.2. Use 2.0 K bath 4. Anemia - HGB 9.8 Last ESA dose 12/08/15 due for dose this week. Follow HGB 5. Secondary hyperparathyroidism- Continue VDRA, binders. Ca 9.8 6. HTN/volume - Had urgent HD last PM Net UF 2952. Post wt 72.1 kg. Will have short HD today to get back on schedule-attempt 3-4 liters and lower EDW at DC. Continue labetalol, clonidine and amlodipine.  7. Nutrition - Albumin 3.5 Renal diet fld restrictions 8. DM: per primary  Rita H. Brown NP-C 12/18/2015, 10:39 AM  [facility] [phone]  Renal Attending: Will need lower EDW to optimize volume status and hypertension.  Nieve Rojero C   Subjective: "I feel better". Patient sleeping in low fowlers position, denies SOB/Chest pain at present   Objective Filed Vitals:   12/18/15 0457 12/18/15 0500 12/18/15 0600 12/18/15 0900  BP: 202/83  182/73   Pulse: 107  100   Temp: 98 F (36.7 C)     TempSrc: Oral     Resp: 20  18   Height:      Weight:  72.122 kg (159 lb)    SpO2: 95%  100% 96%   Physical Exam General: well nourished female in NAD Heart: S1, S2, + S4  SR on monitor rate 90s.  Lungs: bilateral breath sounds with few bibasilar crackles Abdomen: soft, nontender active BS Extremities: No LE edema.  Skin: ecchymosis R hand, reddened from infiltrated IV. No pruritus, rashes, cyanosis.  Neuro:slighlty lethargic oriented  Dialysis Access: LUA AVG + thrill + bruit . Had some bleeding post HD. Pressure held per nursing staff until hemostasis achieved. Stable now.   Dialysis Orders: MonWedFri, 4 hrs 0 min, 180NRe Optiflux, BFR 400, DFR Autoflow 1.5, EDW 71 (kg), Dialysate 2.0 K, 2.0 Ca,   UFR Profile: None, Sodium Model: None, Access: LUA AV Graft Heparin: 2400 units per treatment Mircera: 225 mcg IV q 2 weeks ( last dose 12/08/15) Hectoral: 4 mcg IV q MWF Venofer: 50 mg IV q week (Last dose 12/13/15)   Additional Objective Labs: Basic Metabolic Panel:  Recent Labs Lab 12/17/15 2137 12/18/15 0550  NA 135 138  K 6.0* 4.2  CL 95* 95*  CO2 21* 29  GLUCOSE 308* 111*  BUN 66* 23*  CREATININE 8.96* 4.47*  CALCIUM 9.5 9.8   Liver Function Tests:  Recent Labs Lab 12/17/15 2137  AST 17  ALT 12*  ALKPHOS 64  BILITOT 0.5  PROT 7.8  ALBUMIN 3.5   No results for input(s): LIPASE, AMYLASE in the last 168 hours. CBC:  Recent Labs Lab 12/17/15 2137  WBC 11.3*  NEUTROABS 9.8*  HGB 9.8*  HCT 30.5*  MCV 91.6  PLT 234   Blood Culture    Component Value Date/Time   SDES BLOOD RIGHT ARM 04/11/2015 1715   SPECREQUEST BOTTLES DRAWN AEROBIC AND ANAEROBIC 5CC 04/11/2015 1715   CULT NO GROWTH 5 DAYS 04/11/2015 1715   REPTSTATUS 04/16/2015 FINAL 04/11/2015 1715    Cardiac Enzymes:  Recent Labs Lab 12/18/15 0550  TROPONINI 0.05*   CBG:  Recent Labs Lab 12/18/15 0608  GLUCAP 123*   Iron Studies: No results for input(s): IRON, TIBC, TRANSFERRIN, FERRITIN in the last 72 hours. @lablastinr3 @ Studies/Results: Dg  Chest Port 1 View  12/17/2015  CLINICAL DATA:  Acute onset of shortness of breath. Initial encounter. EXAM: PORTABLE CHEST 1 VIEW COMPARISON:  Chest radiograph performed 04/13/2015 FINDINGS: The lungs are well-aerated. Vascular congestion is noted. Small bilateral pleural effusions are seen, right greater than left, with bilateral airspace opacification, likely reflecting mildly asymmetric pulmonary edema. No pneumothorax is seen. The cardiomediastinal silhouette is enlarged. No acute osseous abnormalities are seen. IMPRESSION: Vascular congestion and cardiomegaly. Small bilateral pleural effusions, right greater than left, with bilateral airspace  opacification, likely reflecting mildly asymmetric pulmonary edema. Electronically Signed   By: Garald Balding M.D.   On: 12/17/2015 21:47   Medications: . nitroGLYCERIN     . amLODipine  10 mg Oral QHS  . aspirin EC  81 mg Oral QHS  . calcium acetate  2,001 mg Oral TID WC  . cloNIDine  0.1 mg Oral BID  . enoxaparin (LOVENOX) injection  30 mg Subcutaneous Q24H  . escitalopram  10 mg Oral Daily  . insulin aspart  0-9 Units Subcutaneous TID WC  . insulin aspart  5 Units Subcutaneous Once  . insulin glargine  40 Units Subcutaneous QHS  . labetalol  300 mg Oral BID  . metoCLOPramide  5 mg Oral QID  . multivitamin  1 tablet Oral QHS  . pantoprazole  40 mg Oral Daily  . sodium chloride flush  3 mL Intravenous Q12H

## 2015-12-18 NOTE — Progress Notes (Signed)
PCR swab obtained / sent to lab.

## 2015-12-18 NOTE — Care Management Note (Signed)
Case Management Note Marvetta Gibbons RN, BSN Unit 2W-Case Manager [phone]  Patient Details  Name: TAJEE BITTEL MRN: [id number] Date of Birth: Jan 07, 1976  Subjective/Objective:   Pt admitted with HTN crisis, pulm. Edema, need for emergent HD                 Action/Plan: PTA pt lived at home- hx ESRD on HD- CM to follow for d/c needs- anticipate return home  Expected Discharge Date:                  Expected Discharge Plan:  Home/Self Care  In-House Referral:     Discharge planning Services  CM Consult  Post Acute Care Choice:    Choice offered to:     DME Arranged:    DME Agency:     HH Arranged:    [place] Agency:     Status of Service:  In process, will continue to follow  Medicare Important Message Given:    Date Medicare IM Given:    Medicare IM give by:    Date Additional Medicare IM Given:    Additional Medicare Important Message give by:     If discussed at [place] of Stay Meetings, dates discussed:    Additional Comments:  Dawayne Patricia, RN 12/18/2015, 2:21 PM

## 2015-12-18 NOTE — Progress Notes (Signed)
Lab notified this RN short while ago - will not be able to draw labs until 1830 - must wait 2 hours from completion of dialysis. Notified Dr. Sloan Leiter via [facility] tx page of delay regarding labs.

## 2015-12-18 NOTE — Progress Notes (Signed)
Chaplain  presented to the patient's room on 2W and was informed by assigned nurse that the patient was in hemodialysis (6E). Chaplain presented to the hemodialysis Unit to provide spiritual care support.  After introduction as Chaplain, provided dialogue with patient regarding her health care needs, she reports a five year treatment history.  A prayer of healing and wholeness was provided on the patient's behalf. A follow up visit will be provided as needed. Chaplain Yaakov Guthrie [phone]

## 2015-12-18 NOTE — Progress Notes (Signed)
Utilization review completed.  

## 2015-12-18 NOTE — ED Notes (Signed)
Pt continues on bi-pap  Asking how long before she goes upstairs

## 2015-12-18 NOTE — Consult Note (Signed)
Renal Service Consult Note [facility]  [name] 12/18/2015 [place] Requesting Physician:  Dr. Blaine Hamper  Reason for Consult:  ESRD pt w resp distress HPI: The patient is a 40 y.o. year-old with history of DM, HTN, KP transplant in 2010, hx ovarian Ca 2005, depression, partially rx'd TB , cardiac arrest d/t hyperkalemia, presenting with SOB x 12 hours, progressively worsening.  No CP, cough, fevers or chills.  No swelling of legs, no abd pain, nausea, vomiting diarrhea or dysuria.  No focal weakness.  In ED BP 215/87 and pt with resp difficulty, placed on bipap and now is much more comfortable per ED MD.      ROS  denies CP  no joint pain   no HA  no blurry vision  no rash  no dysuria  no difficulty voiding  no change in urine color    Past Medical History  Past Medical History  Diagnosis Date  . HTN (hypertension)     not well controlled  . 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 History of blood transfusion     "lots of times" (01/27/2013)  . CAP (community acquired pneumonia) 2010  . Type II diabetes mellitus ([place])   . Anemia   . Ovarian tumor, malignant ([place]) 2005    resected in [place]' pt denies that this was cancer on 01/27/2013  . Diabetic retinopathy ([place])   . On home oxygen therapy     "5L; 24/7" (01/25/2014)  . ESRD on hemodialysis ([place]) 08/25/2013    Started HD in 2008.  Had [place] transplant in 2010 at [facility].  Went back on HD in 2012.  Pancreas was removed surgically at [facility] for rejection w abcess formation.  The kidney was left in.  She now gets HD at [facility] on TTS schedule.     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  Both organs have since rejected, see "ESRD" for details.   . ARF (acute respiratory failure) ([place]) 04/11/2015    with hypoxia   Past Surgical History  Past Surgical History  Procedure  Laterality Date  . Tumor removal  2005    Ovarian, resected in [place]  . Av fistula placement  06/03/2012    Procedure: INSERTION OF ARTERIOVENOUS (AV) GORE-TEX GRAFT ARM;  Surgeon: Rosetta Posner, MD;  Location: [facility];  Service: Vascular;  Laterality: Left;  [name] Eye surgery Right     "cause of diabetes" (4/23/20140  . Combined kidney-pancreas transplant  2010    /notes 01/27/2013; @ [facility]  . 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 Renal biopsy  12/13/2010; 10/25/2011    Archie Endo 01/27/2013  . Tee without cardioversion N/A 04/22/2014    Procedure: TRANSESOPHAGEAL ECHOCARDIOGRAM (TEE);  Surgeon: Fay Records, MD;  Location: [facility] ENDOSCOPY;  Service: Cardiovascular;  Laterality: N/AHinton D[place]   Family History  Family History  Problem Relation Age of Onset  . Diabetes Mother   . Liver cancer Father   . Cancer Father    Social History  reports that she has never smoked. She has never used smokeless tobacco. She reports that she does not drink alcohol or use illicit drugs. Allergies  Allergies  Allergen Reactions  . Ace Inhibitors Cough   Home medications Prior to Admission  medications   Medication Sig Start Date End Date Taking? Authorizing Provider  amLODipine (NORVASC) 10 MG tablet Take 10 mg by mouth at bedtime.    Yes Historical Provider, MD  aspirin EC 81 MG tablet Take 81 mg by mouth at bedtime.    Yes Historical Provider, MD  B Complex-C-Folic Acid (RENA-VITE PO) Take 1 tablet by mouth daily.   Yes Historical Provider, MD  calcium acetate (PHOSLO) 667 MG capsule Take 3 capsules (2,001 mg total) by mouth 3 (three) times daily with meals. 01/28/13  Yes Alric Seton, PA-C  cloNIDine (CATAPRES) 0.1 MG tablet Take 0.1 mg by mouth 2 (two) times daily.    Yes Historical Provider, MD  diphenhydrAMINE (BENADRYL) 25 mg capsule Take 25 mg by mouth daily as needed for allergies.   Yes Historical Provider, MD  escitalopram (LEXAPRO) 20  MG tablet Take 10 mg by mouth daily.    Yes Historical Provider, MD  insulin aspart (NOVOLOG) 100 UNIT/ML injection Inject 12 Units into the skin 2 (two) times daily with breakfast and lunch.    Yes Historical Provider, MD  insulin glargine (LANTUS) 100 UNIT/ML injection Inject 0.5 mLs (50 Units total) into the skin at bedtime. 04/23/14  Yes Delfina Redwood, MD  labetalol (NORMODYNE) 300 MG tablet Take 300 mg by mouth 2 (two) times daily.   Yes Historical Provider, MD  metoCLOPramide (REGLAN) 5 MG tablet Take 5 mg by mouth 4 (four) times daily.   Yes Historical Provider, MD  pantoprazole (PROTONIX) 40 MG tablet Take 40 mg by mouth daily.   Yes Historical Provider, MD  oxyCODONE-acetaminophen (PERCOCET/ROXICET) 5-325 MG per tablet Take 1 tablet by mouth every 4 (four) hours as needed for moderate pain. Patient not taking: Reported on 12/17/2015 11/15/14   Donne Hazel, MD   Liver Function Tests  Recent Labs Lab 12/17/15 2137  AST 17  ALT 12*  ALKPHOS 64  BILITOT 0.5  PROT 7.8  ALBUMIN 3.5   No results for input(s): LIPASE, AMYLASE in the last 168 hours. CBC  Recent Labs Lab 12/17/15 2137  WBC 11.3*  NEUTROABS 9.8*  HGB 9.8*  HCT 30.5*  MCV 91.6  PLT [id number]   Basic Metabolic Panel  Recent Labs Lab 12/17/15 2137  NA 135  K 6.0*  CL 95*  CO2 21*  GLUCOSE 308*  BUN 66*  CREATININE 8.96*  CALCIUM 9.5    Filed Vitals:   12/17/15 2148 12/17/15 2348  BP: 215/87   Pulse: 116 117  Temp: 98.4 F (36.9 C)   TempSrc: Oral   Resp: 28 24  Height: 5\' 7"  (1.702 m)   Weight: 75 kg (165 lb 5.5 oz)   SpO2: 99% 98%   Exam On bipap, sitting cross-legged on stretcher, no distress No rash, cyanosis or gangrene Sclera anicteric, throat clear ++jvd Chest no rales/ rhonchi/ wheezing RRR +S4 S1S2, no MR Abd soft ntnd no mass or ascites GU defer MS no joint effusions Ext no LE or UE edema Neuro is alert Ox 3 nf LUA AVf +bruit  Na 135  K 6.0  CO2 21  BUN 66 Creat 8.96   Ca  9.5  Alb 3.5  LFT's ok  BNP 1858  WBC 11k  Hb 9.8 EKG  Sinus tach, 114 bpm, LVH w repol changes, some ant-lat ST depression CXR bilat severe pulm edema, RLL effusion  Assessment: 1 Resp distress/ SOB/ hypoxemia - due to pulm edema from vol overload and/or HTN'sive crisis.   2 ESRD  hx poor compliance with diet/ liquids    3 ESRD HD MWF, had HD last Fri 4 DM2 on insulin 5 Hyperkalemia - mild for her 6 HTN'sive urgency - on IV NTG 7 Depression - lexapro  Plan - acute HD tonight, get vol / K+ down, wean off bipap  Kelly Splinter MD [place] pager [phone]    cell [phone] 12/18/2015, 12:31 AM

## 2015-12-19 NOTE — ED Provider Notes (Signed)
CSN: [id number]     Arrival date & time 12/17/15  2120 History   First MD Initiated Contact with Patient 12/17/15 2125     Chief Complaint  Patient presents with  . Respiratory Distress     HPI 40 y.o. seen HISTORY of ESRD on dialysis Monday Wednesday Friday, status post failed renal transplant, who presents with shortness of breath. Patient hypertensive on presentation with blood pressure 203/93. Reports shortness of breath 1 day. Denies skipping dialysis session. Denies cough, chest pain, or fevers. No infectious symptoms. Noted to be tripoding on EMS arrival. Symptoms improved on nonrebreather. Patient reports that "I feel fluid overloaded." Shortness of breath is not worse with exertion.  Past Medical History  Diagnosis Date  . HTN (hypertension)     not well controlled  . 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 History of blood transfusion     "lots of times" (01/27/2013)  . CAP (community acquired pneumonia) 2010  . Type II diabetes mellitus ([place])   . Anemia   . Ovarian tumor, malignant ([place]) 2005    resected in [place]' pt denies that this was cancer on 01/27/2013  . Diabetic retinopathy ([place])   . On home oxygen therapy     "5L; nightttime" (12/18/2015)  . ESRD on hemodialysis ([place]) 08/25/2013    Started HD in 2008.  Had [place] transplant in 2010 at [facility].  Went back on HD in 2012.  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  The kidney was left in.  She now gets HD at [facility] on TTS schedule.     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  Both organs have since rejected, see "ESRD" for details.   . ARF (acute respiratory failure) ([place]) 04/11/2015    with hypoxia   Past Surgical History  Procedure Laterality Date  . Tumor removal  2005    Ovarian, resected in [place]  . Av fistula placement  06/03/2012     Procedure: INSERTION OF ARTERIOVENOUS (AV) GORE-TEX GRAFT ARM;  Surgeon: Rosetta Posner, MD;  Location: [place];  Service: Vascular;  Laterality: Left;  [name] Eye surgery Right     "cause of diabetes" (4/23/20140  . Combined kidney-pancreas transplant  2010    /notes 01/27/2013; @ [facility]  . 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 Renal biopsy  12/13/2010; 10/25/2011    Archie Endo 01/27/2013  . Tee without cardioversion N/A 04/22/2014    Procedure: TRANSESOPHAGEAL ECHOCARDIOGRAM (TEE);  Surgeon: Fay Records, MD;  Location: [facility] ENDOSCOPY;  Service: Cardiovascular;  Laterality: N/AHinton D[place]   Family History  Problem Relation Age of Onset  . Diabetes Mother   . Liver cancer Father   . Cancer Father    Social History  Substance Use Topics  . Smoking status: Never Smoker   . Smokeless tobacco: Never Used  . Alcohol Use: No   OB History    No data available     Review of Systems  Constitutional: Negative for fever, chills, activity change and appetite change.  HENT: Negative for congestion, rhinorrhea and sore throat.   Eyes: Negative for visual disturbance.  Respiratory: Positive for shortness of breath. Negative for cough and wheezing.   Cardiovascular: Negative for chest pain and palpitations.  Gastrointestinal: Negative for nausea, vomiting, abdominal pain, diarrhea, constipation, blood in stool and abdominal distention.  Genitourinary: Negative for dysuria, frequency and flank pain.  Musculoskeletal: Negative for myalgias, back pain, joint swelling, arthralgias, gait problem, neck pain and neck stiffness.  Skin: Negative for rash.  Neurological: Negative for dizziness, tremors, syncope, facial asymmetry, speech difficulty, weakness, numbness and headaches.  Psychiatric/Behavioral: Negative for behavioral problems, confusion and agitation.      Allergies  Ace inhibitors  Home Medications   Prior to Admission medications    Medication Sig Start Date End Date Taking? Authorizing Provider  amLODipine (NORVASC) 10 MG tablet Take 10 mg by mouth at bedtime.    Yes Historical Provider, MD  aspirin EC 81 MG tablet Take 81 mg by mouth at bedtime.    Yes Historical Provider, MD  B Complex-C-Folic Acid (RENA-VITE PO) Take 1 tablet by mouth daily.   Yes Historical Provider, MD  calcium acetate (PHOSLO) 667 MG capsule Take 3 capsules (2,001 mg total) by mouth 3 (three) times daily with meals. 01/28/13  Yes Alric Seton, PA-C  cloNIDine (CATAPRES) 0.1 MG tablet Take 0.1 mg by mouth 2 (two) times daily.    Yes Historical Provider, MD  diphenhydrAMINE (BENADRYL) 25 mg capsule Take 25 mg by mouth daily as needed for allergies.   Yes Historical Provider, MD  escitalopram (LEXAPRO) 20 MG tablet Take 10 mg by mouth daily.    Yes Historical Provider, MD  insulin aspart (NOVOLOG) 100 UNIT/ML injection Inject 12 Units into the skin 2 (two) times daily with breakfast and lunch.    Yes Historical Provider, MD  insulin glargine (LANTUS) 100 UNIT/ML injection Inject 0.5 mLs (50 Units total) into the skin at bedtime. 04/23/14  Yes Delfina Redwood, MD  labetalol (NORMODYNE) 300 MG tablet Take 300 mg by mouth 2 (two) times daily.   Yes Historical Provider, MD  metoCLOPramide (REGLAN) 5 MG tablet Take 5 mg by mouth 4 (four) times daily.   Yes Historical Provider, MD  pantoprazole (PROTONIX) 40 MG tablet Take 40 mg by mouth daily.   Yes Historical Provider, MD  oxyCODONE-acetaminophen (PERCOCET/ROXICET) 5-325 MG per tablet Take 1 tablet by mouth every 4 (four) hours as needed for moderate pain. Patient not taking: Reported on 12/17/2015 11/15/14   Donne Hazel, MD   BP 148/67 mmHg  Pulse 86  Temp(Src) 98.4 F (36.9 C) (Oral)  Resp 18  Ht 5\' 7"  (1.702 m)  Wt 70.1 kg  BMI 24.20 kg/m2  SpO2 94%  LMP  (LMP Unknown) Physical Exam  Constitutional: She is oriented to person, place, and time. She appears well-developed and well-nourished. No  distress.  HENT:  Head: Normocephalic and atraumatic.  Right Ear: External ear normal.  Left Ear: External ear normal.  Nose: Nose normal.  Mouth/Throat: Oropharynx is clear and moist. No oropharyngeal exudate.  Eyes: Conjunctivae and EOM are normal. Pupils are equal, round, and reactive to light. Right eye exhibits no discharge. Left eye exhibits no discharge.  Neck: Normal range of motion. Neck supple.  Cardiovascular: Normal rate, regular rhythm and normal heart sounds.  Exam reveals no gallop and no friction rub.   No murmur heard. Pulmonary/Chest: She is in respiratory distress. She has no wheezes. She has no rales.  Increased WOB. Crackles at the bilateral bases  Abdominal: Soft. Bowel sounds are normal. She exhibits no distension and no mass. There is no tenderness. There is no rebound and no guarding.  Musculoskeletal: Normal range of motion. She exhibits  no edema or tenderness.  Neurological: She is alert and oriented to person, place, and time. She exhibits normal muscle tone.  Skin: Skin is warm and dry. No rash noted. She is not diaphoretic.  Psychiatric: She has a normal mood and affect. Her behavior is normal. Judgment and thought content normal.    ED Course  Procedures (including critical care time) Labs Review Labs Reviewed  SURGICAL PCR SCREEN - Abnormal; Notable for the following:    Staphylococcus aureus POSITIVE (*)    All other components within normal limits  CBC WITH DIFFERENTIAL/PLATELET - Abnormal; Notable for the following:    WBC 11.3 (*)    RBC 3.33 (*)    Hemoglobin 9.8 (*)    HCT 30.5 (*)    RDW 16.4 (*)    Neutro Abs 9.8 (*)    All other components within normal limits  COMPREHENSIVE METABOLIC PANEL - Abnormal; Notable for the following:    Potassium 6.0 (*)    Chloride 95 (*)    CO2 21 (*)    Glucose, Bld 308 (*)    BUN 66 (*)    Creatinine, Ser 8.96 (*)    ALT 12 (*)    GFR calc non Af Amer 5 (*)    GFR calc Af Amer 6 (*)    Anion gap 19  (*)    All other components within normal limits  BRAIN NATRIURETIC PEPTIDE - Abnormal; Notable for the following:    B Natriuretic Peptide 1858.3 (*)    All other components within normal limits  APTT - Abnormal; Notable for the following:    aPTT 38 (*)    All other components within normal limits  TROPONIN I - Abnormal; Notable for the following:    Troponin I 0.05 (*)    All other components within normal limits  TROPONIN I - Abnormal; Notable for the following:    Troponin I 0.09 (*)    All other components within normal limits  BASIC METABOLIC PANEL - Abnormal; Notable for the following:    Chloride 95 (*)    Glucose, Bld 111 (*)    BUN 23 (*)    Creatinine, Ser 4.47 (*)    GFR calc non Af Amer 11 (*)    GFR calc Af Amer 13 (*)    All other components within normal limits  GLUCOSE, CAPILLARY - Abnormal; Notable for the following:    Glucose-Capillary 123 (*)    All other components within normal limits  GLUCOSE, CAPILLARY - Abnormal; Notable for the following:    Glucose-Capillary 111 (*)    All other components within normal limits  RENAL FUNCTION PANEL - Abnormal; Notable for the following:    Chloride 92 (*)    Glucose, Bld 202 (*)    Creatinine, Ser 3.28 (*)    GFR calc non Af Amer 17 (*)    GFR calc Af Amer 19 (*)    All other components within normal limits  CBC - Abnormal; Notable for the following:    RBC 3.35 (*)    Hemoglobin 9.8 (*)    HCT 31.1 (*)    RDW 16.4 (*)    All other components within normal limits  GLUCOSE, CAPILLARY - Abnormal; Notable for the following:    Glucose-Capillary 107 (*)    All other components within normal limits  GLUCOSE, CAPILLARY - Abnormal; Notable for the following:    Glucose-Capillary 219 (*)    All other components within normal limits  I-STAT ARTERIAL BLOOD GAS, ED - Abnormal; Notable for the following:    pO2, Arterial 37.0 (*)    Bicarbonate 24.8 (*)    All other components within normal limits  PROTIME-INR     Imaging Review Dg Chest Port 1 View  12/17/2015  CLINICAL DATA:  Acute onset of shortness of breath. Initial encounter. EXAM: PORTABLE CHEST 1 VIEW COMPARISON:  Chest radiograph performed 04/13/2015 FINDINGS: The lungs are well-aerated. Vascular congestion is noted. Small bilateral pleural effusions are seen, right greater than left, with bilateral airspace opacification, likely reflecting mildly asymmetric pulmonary edema. No pneumothorax is seen. The cardiomediastinal silhouette is enlarged. No acute osseous abnormalities are seen. IMPRESSION: Vascular congestion and cardiomegaly. Small bilateral pleural effusions, right greater than left, with bilateral airspace opacification, likely reflecting mildly asymmetric pulmonary edema. Electronically Signed   By: Garald Balding M.D.   On: 12/17/2015 21:47   I have personally reviewed and evaluated these images and lab results as part of my medical decision-making.   EKG Interpretation   Date/Time:  Sunday December 17 2015 21:41:12 EDT Ventricular Rate:  113 PR Interval:  152 QRS Duration: 94 QT Interval:  355 QTC Calculation: 487 R Axis:   84 Text Interpretation:  Sinus tachycardia Probable left ventricular  hypertrophy Abnormal T, consider ischemia, lateral leads ST elev, probable  normal early repol pattern ED PHYSICIAN INTERPRETATION AVAILABLE IN [place]  [facility] Confirmed by TEST, Record ([id number]) on 12/18/2015 6:40:00 AM      MDM   Final diagnoses:  Flash pulmonary edema (HCC)  ESRD (end stage renal disease) on dialysis [facility])   Patient started on BiPAP on arrival with improvement in her tachypnea and work of breathing.chest x-ray revealing vascular congestion and cardiomegaly. EKG with no ischemic changes. BNP greatly elevated to 1858.potassium elevated to 6.0. Calcium and insulin given. Nephrology contacted for emergent dialysis. Patient will be admitted to their service for emergent dialysis in the setting of hyperkalemia as well as  fluid overload. Patient is able stable for transfer to dialysis.    Zipporah Plants, MD 12/19/15 1457  Varney Biles, MD 12/20/15 1258

## 2016-02-08 ENCOUNTER — Other Ambulatory Visit: Payer: Self-pay | Admitting: *Deleted

## 2016-02-08 DIAGNOSIS — Z01810 Encounter for preprocedural cardiovascular examination: Secondary | ICD-10-CM

## 2016-02-08 DIAGNOSIS — N186 End stage renal disease: Secondary | ICD-10-CM

## 2016-02-20 ENCOUNTER — Encounter: Payer: Self-pay | Admitting: Vascular Surgery

## 2016-02-22 ENCOUNTER — Institutional Professional Consult (permissible substitution): Payer: Medicare Other | Admitting: Pulmonary Disease

## 2016-02-27 ENCOUNTER — Other Ambulatory Visit: Payer: Self-pay

## 2016-02-27 ENCOUNTER — Ambulatory Visit (HOSPITAL_COMMUNITY)
Admission: RE | Admit: 2016-02-27 | Discharge: 2016-02-27 | Disposition: A | Discharge status: Home / Self Care | Payer: Medicare Other | Source: Ambulatory Visit | Attending: Vascular Surgery | Admitting: Vascular Surgery

## 2016-02-27 ENCOUNTER — Ambulatory Visit (INDEPENDENT_AMBULATORY_CARE_PROVIDER_SITE_OTHER): Payer: Medicare Other | Admitting: Vascular Surgery

## 2016-02-27 ENCOUNTER — Encounter: Payer: Self-pay | Admitting: Vascular Surgery

## 2016-02-27 ENCOUNTER — Ambulatory Visit (INDEPENDENT_AMBULATORY_CARE_PROVIDER_SITE_OTHER)
Admission: RE | Admit: 2016-02-27 | Discharge: 2016-02-27 | Disposition: A | Discharge status: Home / Self Care | Payer: Medicare Other | Source: Ambulatory Visit | Attending: Vascular Surgery | Admitting: Vascular Surgery

## 2016-02-27 VITALS — BP 193/87 | HR 74 | Ht 67.0 in | Wt 175.0 lb

## 2016-02-27 DIAGNOSIS — Z01810 Encounter for preprocedural cardiovascular examination: Secondary | ICD-10-CM

## 2016-02-27 DIAGNOSIS — Z992 Dependence on renal dialysis: Secondary | ICD-10-CM | POA: Diagnosis not present

## 2016-02-27 DIAGNOSIS — N186 End stage renal disease: Secondary | ICD-10-CM

## 2016-02-27 NOTE — Progress Notes (Signed)
Subjective:     Patient ID: [name], female   DOB: 12/06/1975, 40 y.o.   MRN: [id number]  HPI this 40 year old female was referred for evaluation for new vascular access. She recently has had multiple attempts to salvage a left upper arm AV graft which a been unsuccessful. She is right handed and is never had access in the right upper extremity. She dialyzes on Monday Wednesday Friday. She denies pain or numbness in the right hand. She does not take anticoagulants.  Past Medical History  Diagnosis Date  . HTN (hypertension)     not well controlled  . 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 History of blood transfusion     "lots of times" (01/27/2013)  . CAP (community acquired pneumonia) 2010  . Type II diabetes mellitus ([place])   . Anemia   . Ovarian tumor, malignant ([place]) 2005    resected in [place]' pt denies that this was cancer on 01/27/2013  . Diabetic retinopathy ([place])   . On home oxygen therapy     "5L; nightttime" (12/18/2015)  . ESRD on hemodialysis ([place]) 08/25/2013    Started HD in 2008.  Had [place] transplant in 2010 at [facility].  Went back on HD in 2012.  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  The kidney was left in.  She now gets HD at [facility] on TTS schedule.     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  Both organs have since rejected, see "ESRD" for details.   . ARF (acute respiratory failure) ([place]) 04/11/2015    with hypoxia    Social History  Substance Use Topics  . Smoking status: Never Smoker   . Smokeless tobacco: Never Used  . Alcohol Use: No    Family History  Problem Relation Age of Onset  . Diabetes Mother   . Liver cancer Father   . Cancer Father     Allergies  Allergen Reactions  . Ace Inhibitors Cough     Current outpatient prescriptions:  .  amLODipine (NORVASC) 10 MG tablet,  Take 10 mg by mouth at bedtime. , Disp: , Rfl:  .  aspirin EC 81 MG tablet, Take 81 mg by mouth at bedtime. , Disp: , Rfl:  .  B Complex-C-Folic Acid (RENA-VITE PO), Take 1 tablet by mouth daily., Disp: , Rfl:  .  calcium acetate (PHOSLO) 667 MG capsule, Take 3 capsules (2,001 mg total) by mouth 3 (three) times daily with meals., Disp: , Rfl:  .  cloNIDine (CATAPRES) 0.1 MG tablet, Take 0.1 mg by mouth 2 (two) times daily. , Disp: , Rfl:  .  diphenhydrAMINE (BENADRYL) 25 mg capsule, Take 25 mg by mouth daily as needed for allergies., Disp: , Rfl:  .  escitalopram (LEXAPRO) 20 MG tablet, Take 10 mg by mouth daily. , Disp: , Rfl:  .  insulin aspart (NOVOLOG) 100 UNIT/ML injection, Inject 12 Units into the skin 2 (two) times daily with breakfast and lunch. , Disp: , Rfl:  .  insulin glargine (LANTUS) 100 UNIT/ML injection, Inject 0.5 mLs (50 Units total) into the skin at bedtime., Disp: 10 mL, Rfl: 11 .  labetalol (NORMODYNE) 300 MG tablet, Take 300 mg by mouth 2 (two) times daily., Disp: , Rfl:  .  metoCLOPramide (REGLAN) 5 MG tablet, Take 5  mg by mouth 4 (four) times daily., Disp: , Rfl:  .  pantoprazole (PROTONIX) 40 MG tablet, Take 40 mg by mouth daily., Disp: , Rfl:  .  oxyCODONE-acetaminophen (PERCOCET/ROXICET) 5-325 MG per tablet, Take 1 tablet by mouth every 4 (four) hours as needed for moderate pain. (Patient not taking: Reported on 02/27/2016), Disp: 30 tablet, Rfl: 0  Filed Vitals:   02/27/16 1322  BP: 193/87  Pulse: 74  Height: 5\' 7"  (1.702 m)  Weight: 175 lb (79.379 kg)  SpO2: 96%    Body mass index is 27.4 kg/(m^2).           Review of Systems denies chest pain, dyspnea on exertion, PND, orthopnea, hemoptysis, claudication. Does have history of hypertension, type 2 diabetes mellitus, diabetic retinopathy. Other systems negative and complete review of systems     Objective:   Physical Exam BP 193/87 mmHg  Pulse 74  Ht 5\' 7"  (1.702 m)  Wt 175 lb (79.379 kg)  BMI  27.40 kg/m2  SpO2 96%  LMP  (LMP Unknown)    Gen.-alert and oriented x3 in no apparent distress HEENT normal for age Lungs no rhonchi or wheezing Cardiovascular regular rhythm no murmurs carotid pulses 3+ palpable no bruits audible Abdomen soft nontender no palpable masses Musculoskeletal free of  major deformities Skin clear -no rashes Neurologic normal Lower extremities 3+ femoral and dorsalis pedis pulses palpable bilaterally with no edema  Left arm with thrombosed upper arm AV graft and no distal edema Right upper extremity with no previous incisions. 3+ brachial and 1-2+ radial pulse palpable right hand. Right hand well-perfused.  Today I ordered arterial study and vein mapping of the right upper extremity which I reviewed and interpreted. There is triphasic flow in the right radial and ulnar arteries. The best vein available is the basilic vein in the right upper arm. The left upper arm cephalic vein either has thrombus or is too small in caliber for fistula creation      Assessment:     End-stage renal disease needs vascular access-failed left upper arm AV graft which was inserted in 2013 Type 2 diabetes mellitus Hypertension Diabetic retinopathy    Plan:     Best plan would be right arm basilic vein transposition Have scheduled this for Wednesday, May 31 That  will require patient receiving dialysis on Monday and Tuesday the 29th and 30th of May  Discussed with patient and she would like to proceed under general anesthesia

## 2016-02-29 ENCOUNTER — Encounter: Payer: Self-pay | Admitting: Nephrology

## 2016-03-03 MED ORDER — CEFUROXIME SODIUM 1.5 G IJ SOLR: 1.5000 g | INTRAMUSCULAR | Status: DC

## 2016-03-05 MED ORDER — DEXTROSE 5 % IV SOLN
1.5000 g | INTRAVENOUS | Status: AC
  Administered 2016-03-06: 1.5 g via INTRAVENOUS
  Filled 2016-03-05: qty 1.5

## 2016-03-05 NOTE — Progress Notes (Signed)
Unable to reach pt by phone. Left pre-op instructions on pt's voicemail. Instructed pt to be here at 6:30 AM.  Pt is diabetic, last A1C in EPIC was 6.5 on 04/11/15. Pt instructed to take 1/2 of usual Lantus dose this evening (which would be 25 units), instructed her not to take her Novolog insulin in the AM.  If blood sugar is 70 or below, treat with 1/2 cup of clear juice (apple or cranberry) and recheck blood sugar 15 minutes after drinking juice. If blood sugar continues to be 70 or below, call the Short Stay department and ask to speak to a nurse.

## 2016-03-05 NOTE — Progress Notes (Signed)
Anesthesia Chart Review:  Pt is a 40 year old female scheduled for basilic vein transposition on 03/06/2016 with Dr. Kellie Simmering.   Pt is a same day work up.   PMH includes:  HTN, DM, tuberculosis (completed treatment in 2007), anemia, ESRD on HD (s/p simultaneous kidney and pancreas tranplants 2010, both rejected), uses home oxygen 5L at night. Never smoker. BMI 27.5  Pt hospitalized 3/12-3/13/17 for acute on chronic respiratory failure with hypoxia due to non cardiogenic pulmonary edema, resolved with dialysis x2, thought due to noncompliance with diet. Complicated by hypertensive emergency requiring nitro drip, clonidine, labetalol and amlodipine.   Pt hospitalized 7/5-04/14/15 for acute on chronic respiratory failure with hypoxia secondary to pulmonary vascular congestion, resolved with dialysis. When pt left hospital AMA, was back to baseline chronic hypoxia, SPO2 100% on 5L O2.   Pt hospitalized 1/25-2/11/16 for cardiac arrest due to hyperkalemia, CPR totaled about an 1hour for multiple episodes of arrest, K+ on admission was 8.1. She was paralyzed, intubated, and cooled with the Arctic sun protocol. Pt left eventually left AMA.  Medications include: amlodipine, ASA, clonidine, novolog, lantus, labetalol, reglan, protonix  Labs will be obtained DOS  1 view CXR 12/17/15: Vascular congestion and cardiomegaly. Small bilateral pleural effusions, right greater than left, with bilateral airspace opacification, likely reflecting mildly asymmetric pulmonary edema.  EKG 12/17/15: Sinus tachycardia (113 bpm). Probable LVH. Abnormal T, consider ischemia, lateral leads. ST elev, probable normal early repol pattern. peaked T waves v3-v5  Echo 11/08/14:  - HPI and indications: Limited study for LV function. - Left ventricle: The cavity size was normal. Wall thickness was increased in a pattern of moderate LVH. Systolic function was normal. The estimated ejection fraction was in the range of 50% to 55%. Wall  motion was normal; there were no regional wall motion abnormalities. The study is not technically sufficient to allow evaluation of LV diastolic function. - Impressions: Compared to the prior echo on 11/01/2014, the LVEF is now near normal at 50%.  Will get repeat EKG DOS to have a tracing from when pt is not acutely ill.  As long as pt doesn't have any acute symptoms, and if labs DOS are acceptable, I anticipate pt can proceed as scheduled.   Willeen Cass, FNP-BC [facility] Short Stay Surgical [facility]/Anesthesiology Phone: [phone] 03/05/2016 12:11 PM

## 2016-03-06 ENCOUNTER — Telehealth: Payer: Self-pay | Admitting: Vascular Surgery

## 2016-03-06 ENCOUNTER — Encounter (HOSPITAL_COMMUNITY): Payer: Self-pay | Admitting: *Deleted

## 2016-03-06 ENCOUNTER — Ambulatory Visit (HOSPITAL_COMMUNITY): Payer: Medicare Other | Admitting: Emergency Medicine

## 2016-03-06 ENCOUNTER — Encounter (HOSPITAL_COMMUNITY)
Admission: RE | Disposition: A | Discharge status: Home / Self Care | Payer: Self-pay | Source: Ambulatory Visit | Attending: Vascular Surgery

## 2016-03-06 ENCOUNTER — Ambulatory Visit (HOSPITAL_COMMUNITY)
Admission: RE | Admit: 2016-03-06 | Discharge: 2016-03-06 | Disposition: A | Discharge status: Home / Self Care | Payer: Medicare Other | Source: Ambulatory Visit | Attending: Vascular Surgery | Admitting: Vascular Surgery

## 2016-03-06 DIAGNOSIS — Z79899 Other long term (current) drug therapy: Secondary | ICD-10-CM | POA: Diagnosis not present

## 2016-03-06 DIAGNOSIS — N186 End stage renal disease: Secondary | ICD-10-CM | POA: Insufficient documentation

## 2016-03-06 DIAGNOSIS — Z992 Dependence on renal dialysis: Secondary | ICD-10-CM | POA: Insufficient documentation

## 2016-03-06 DIAGNOSIS — Z9483 Pancreas transplant status: Secondary | ICD-10-CM | POA: Insufficient documentation

## 2016-03-06 DIAGNOSIS — I120 Hypertensive chronic kidney disease with stage 5 chronic kidney disease or end stage renal disease: Secondary | ICD-10-CM | POA: Diagnosis present

## 2016-03-06 DIAGNOSIS — E1122 Type 2 diabetes mellitus with diabetic chronic kidney disease: Secondary | ICD-10-CM | POA: Insufficient documentation

## 2016-03-06 DIAGNOSIS — N185 Chronic kidney disease, stage 5: Secondary | ICD-10-CM | POA: Diagnosis not present

## 2016-03-06 DIAGNOSIS — Z794 Long term (current) use of insulin: Secondary | ICD-10-CM | POA: Diagnosis not present

## 2016-03-06 DIAGNOSIS — E11319 Type 2 diabetes mellitus with unspecified diabetic retinopathy without macular edema: Secondary | ICD-10-CM | POA: Insufficient documentation

## 2016-03-06 DIAGNOSIS — Z940 Kidney transplant status: Secondary | ICD-10-CM | POA: Insufficient documentation

## 2016-03-06 HISTORY — PX: BASCILIC VEIN TRANSPOSITION: SHX5742

## 2016-03-06 LAB — GLUCOSE, CAPILLARY
Glucose-Capillary: 166 mg/dL — ABNORMAL HIGH (ref 65–99)
Glucose-Capillary: 159 mg/dL — ABNORMAL HIGH (ref 65–99)

## 2016-03-06 LAB — POCT I-STAT 4, (NA,K, GLUC, HGB,HCT)
Glucose, Bld: 151 mg/dL — ABNORMAL HIGH (ref 65–99)
HCT: 28 % — ABNORMAL LOW (ref 36.0–46.0)
Hemoglobin: 9.5 g/dL — ABNORMAL LOW (ref 12.0–15.0)
Potassium: 4.7 mmol/L (ref 3.5–5.1)
Sodium: 133 mmol/L — ABNORMAL LOW (ref 135–145)

## 2016-03-06 SURGERY — TRANSPOSITION, VEIN, BASILIC
Anesthesia: General | Site: Arm Upper | Laterality: Right

## 2016-03-06 MED ORDER — PHENYLEPHRINE HCL 10 MG/ML IJ SOLN
INTRAMUSCULAR | Status: DC | PRN
  Administered 2016-03-06: 40 ug via INTRAVENOUS
  Administered 2016-03-06: 80 ug via INTRAVENOUS

## 2016-03-06 MED ORDER — SODIUM CHLORIDE 0.9 % IV SOLN
INTRAVENOUS | Status: DC | PRN
  Administered 2016-03-06: 09:00:00

## 2016-03-06 MED ORDER — FENTANYL CITRATE (PF) 250 MCG/5ML IJ SOLN
INTRAMUSCULAR | Status: DC | PRN
  Administered 2016-03-06: 50 ug via INTRAVENOUS
  Administered 2016-03-06: 100 ug via INTRAVENOUS
  Administered 2016-03-06 (×2): 50 ug via INTRAVENOUS

## 2016-03-06 MED ORDER — ONDANSETRON HCL 4 MG/2ML IJ SOLN
INTRAMUSCULAR | Status: DC | PRN
  Administered 2016-03-06: 4 mg via INTRAVENOUS

## 2016-03-06 MED ORDER — SODIUM CHLORIDE 0.9 % IV SOLN
INTRAVENOUS | Status: DC
  Administered 2016-03-06 (×2): via INTRAVENOUS

## 2016-03-06 MED ORDER — PROPOFOL 10 MG/ML IV BOLUS
INTRAVENOUS | Status: AC
  Filled 2016-03-06: qty 20

## 2016-03-06 MED ORDER — LIDOCAINE 2% (20 MG/ML) 5 ML SYRINGE
INTRAMUSCULAR | Status: AC
  Filled 2016-03-06: qty 10

## 2016-03-06 MED ORDER — MIDAZOLAM HCL 5 MG/5ML IJ SOLN
INTRAMUSCULAR | Status: DC | PRN
  Administered 2016-03-06 (×2): 1 mg via INTRAVENOUS

## 2016-03-06 MED ORDER — HYDROMORPHONE HCL 1 MG/ML IJ SOLN
INTRAMUSCULAR | Status: AC
  Filled 2016-03-06: qty 1

## 2016-03-06 MED ORDER — PROPOFOL 1000 MG/100ML IV EMUL
INTRAVENOUS | Status: AC
  Filled 2016-03-06: qty 100

## 2016-03-06 MED ORDER — FENTANYL CITRATE (PF) 250 MCG/5ML IJ SOLN
INTRAMUSCULAR | Status: AC
  Filled 2016-03-06: qty 5

## 2016-03-06 MED ORDER — OXYCODONE-ACETAMINOPHEN 5-325 MG PO TABS
ORAL_TABLET | ORAL | Status: AC
  Administered 2016-03-06: 1 via ORAL
  Filled 2016-03-06: qty 1

## 2016-03-06 MED ORDER — LIDOCAINE HCL (CARDIAC) 20 MG/ML IV SOLN
INTRAVENOUS | Status: DC | PRN
  Administered 2016-03-06: 80 mg via INTRATRACHEAL

## 2016-03-06 MED ORDER — MIDAZOLAM HCL 2 MG/2ML IJ SOLN
INTRAMUSCULAR | Status: AC
  Filled 2016-03-06: qty 2

## 2016-03-06 MED ORDER — OXYCODONE-ACETAMINOPHEN 5-325 MG PO TABS
1.0000 | ORAL_TABLET | Freq: Once | ORAL | Status: AC
  Administered 2016-03-06: 1 via ORAL

## 2016-03-06 MED ORDER — CHLORHEXIDINE GLUCONATE CLOTH 2 % EX PADS: 6.0000 | MEDICATED_PAD | Freq: Once | CUTANEOUS | Status: DC

## 2016-03-06 MED ORDER — MEPERIDINE HCL 25 MG/ML IJ SOLN: 6.2500 mg | INTRAMUSCULAR | Status: DC | PRN

## 2016-03-06 MED ORDER — PHENYLEPHRINE 40 MCG/ML (10ML) SYRINGE FOR IV PUSH (FOR BLOOD PRESSURE SUPPORT)
PREFILLED_SYRINGE | INTRAVENOUS | Status: AC
  Filled 2016-03-06: qty 10

## 2016-03-06 MED ORDER — PROPOFOL 10 MG/ML IV BOLUS
INTRAVENOUS | Status: DC | PRN
  Administered 2016-03-06: 150 mg via INTRAVENOUS

## 2016-03-06 MED ORDER — OXYCODONE-ACETAMINOPHEN 5-325 MG PO TABS: 1.0000 | ORAL_TABLET | Freq: Four times a day (QID) | ORAL | Status: DC | PRN

## 2016-03-06 MED ORDER — 0.9 % SODIUM CHLORIDE (POUR BTL) OPTIME
TOPICAL | Status: DC | PRN
  Administered 2016-03-06: 1000 mL

## 2016-03-06 MED ORDER — LIDOCAINE-EPINEPHRINE (PF) 1 %-1:200000 IJ SOLN
INTRAMUSCULAR | Status: AC
  Filled 2016-03-06: qty 30

## 2016-03-06 MED ORDER — HYDROMORPHONE HCL 1 MG/ML IJ SOLN
0.2500 mg | INTRAMUSCULAR | Status: DC | PRN
  Administered 2016-03-06 (×4): 0.25 mg via INTRAVENOUS

## 2016-03-06 SURGICAL SUPPLY — 30 items
CANISTER SUCTION 2500CC (MISCELLANEOUS) ×3 IMPLANT
CLIP TI MEDIUM 24 (CLIP) ×3 IMPLANT
CLIP TI WIDE RED SMALL 24 (CLIP) ×3 IMPLANT
COVER PROBE W GEL 5X96 (DRAPES) ×3 IMPLANT
ELECT REM PT RETURN 9FT ADLT (ELECTROSURGICAL) ×3
ELECTRODE REM PT RTRN 9FT ADLT (ELECTROSURGICAL) ×1 IMPLANT
GEL ULTRASOUND 20GR AQUASONIC (MISCELLANEOUS) IMPLANT
GLOVE BIO SURGEON STRL SZ 6.5 (GLOVE) ×6 IMPLANT
GLOVE BIO SURGEONS STRL SZ 6.5 (GLOVE) ×3
GLOVE BIOGEL PI IND STRL 6.5 (GLOVE) ×3 IMPLANT
GLOVE BIOGEL PI INDICATOR 6.5 (GLOVE) ×6
GLOVE ECLIPSE 6.5 STRL STRAW (GLOVE) ×3 IMPLANT
GLOVE SS BIOGEL STRL SZ 7 (GLOVE) ×1 IMPLANT
GLOVE SUPERSENSE BIOGEL SZ 7 (GLOVE) ×2
GOWN STRL REUS W/ TWL LRG LVL3 (GOWN DISPOSABLE) ×3 IMPLANT
GOWN STRL REUS W/TWL LRG LVL3 (GOWN DISPOSABLE) ×6
KIT BASIN OR (CUSTOM PROCEDURE TRAY) ×3 IMPLANT
KIT ROOM TURNOVER OR (KITS) ×3 IMPLANT
LIQUID BAND (GAUZE/BANDAGES/DRESSINGS) ×3 IMPLANT
NS IRRIG 1000ML POUR BTL (IV SOLUTION) ×3 IMPLANT
PACK CV ACCESS (CUSTOM PROCEDURE TRAY) ×3 IMPLANT
PAD ARMBOARD 7.5X6 YLW CONV (MISCELLANEOUS) ×6 IMPLANT
SUT PROLENE 6 0 BV (SUTURE) ×3 IMPLANT
SUT SILK 2 0 SH (SUTURE) ×3 IMPLANT
SUT SILK 4 0 (SUTURE) ×2
SUT SILK 4-0 18XBRD TIE 12 (SUTURE) ×1 IMPLANT
SUT VIC AB 3-0 SH 27 (SUTURE) ×6
SUT VIC AB 3-0 SH 27X BRD (SUTURE) ×3 IMPLANT
UNDERPAD 30X30 INCONTINENT (UNDERPADS AND DIAPERS) ×3 IMPLANT
WATER STERILE IRR 1000ML POUR (IV SOLUTION) ×3 IMPLANT

## 2016-03-06 NOTE — Telephone Encounter (Signed)
Sched appt 7/10 at 10:30. Lm on hm# to inform pt of appt.

## 2016-03-06 NOTE — Op Note (Signed)
OPERATIVE REPORT  Date of Surgery: 03/06/2016  Surgeon: Tinnie Gens, MD  Assistant: Leontine Locket PA  Pre-op Diagnosis: End Stage Renal Disease N18.6  Post-op Diagnosis: End Stage Renal Disease N18.6  Procedure: Procedure(s): RIGHT ARM BASILIC VEIN TRANSPOSITION  Anesthesia: LMA  EBL: Minimal  Complications: None  Procedure Details: The patient was taken the operative placed in supine position at which time satisfactory general-LMA anesthesia was ministered. The right upper extremity was prepped Betadine scrub and solution draped in routine sterile manner. Using the mode ultrasound-sono site and the basilic vein was then marked on the medial aspect of the right upper arm down into the proximal forearm was of good caliber. Basilic vein was then exposed and the distal forearm and traced proximally through 4 separate incisions up to the axilla branches were ligated with 2 and 3-0 silk ties and divided it was transected after ligating it distally delivered into the proximal wound gently dilated with heparinized saline was an excellent vein. Brachial artery was then exposed through the distal upper arm incision. It was mildly diseased but had an excellent pulse. It was encircled proximally and distally with Vesseloops. A curvilinear tunnel was created on the anterior aspect of the arm between this incision and the axillary wound and the basilic vein was carefully delivered through the tunnel preserve its orientation. Brachial artery was occluded proximally and distally with Vesseloops opened 15 blade extended with Potts scissors vein was carefully measured slightly spatulated and anastomosed inside with 6-0 Prolene. Vesseloops then released there was good pulse and palpable thrill in the fistula. There was also radial and ulnar arterial flow which slightly diminished with the fistula open but did not disappear. Adequate hemostasis was achieved. No heparin was given. Wounds were closed in layers  with Vicryl in subcuticular fashion with Dermabond patient taken to recovery in satisfactory condition   Tinnie Gens, MD 03/06/2016 10:29 AM

## 2016-03-06 NOTE — Discharge Instructions (Signed)
° ° °  03/06/2016 ZYNASIA GIECK [id number] [birth year]  Surgeon(s): Mal Misty, MD  Procedure(s): RIGHT ARM BASILIC VEIN TRANSPOSITION  x Do not stick fistula for 12 weeks

## 2016-03-06 NOTE — Telephone Encounter (Signed)
-----   Message from Mena Goes, RN sent at 03/06/2016 11:03 AM EDT ----- Regarding: schedule   ----- Message -----    From: Gabriel Earing, PA-C    Sent: 03/06/2016  10:22 AM      To: Vvs Charge Pool  Dr. Kellie Simmering wants who ever is available to see this pt in 6 weeks.  She does not need a duplex.  Thanks, Aldona Bar

## 2016-03-06 NOTE — Interval H&P Note (Signed)
History and Physical Interval Note:  03/06/2016 8:17 AM  [name]  has presented today for surgery, with the diagnosis of End Stage Renal Disease N18.6  The various methods of treatment have been discussed with the patient and family. After consideration of risks, benefits and other options for treatment, the patient has consented to  Procedure(s): BASILIC VEIN TRANSPOSITION (Right) as a surgical intervention .  The patient's history has been reviewed, patient examined, no change in status, stable for surgery.  I have reviewed the patient's chart and labs.  Questions were answered to the patient's satisfaction.     Tinnie Gens

## 2016-03-06 NOTE — H&P (View-Only) (Signed)
Subjective:     Patient ID: [name], female   DOB: Mar 26, 1976, 40 y.o.   MRN: [id number]  HPI this 40 year old female was referred for evaluation for new vascular access. She recently has had multiple attempts to salvage a left upper arm AV graft which a been unsuccessful. She is right handed and is never had access in the right upper extremity. She dialyzes on Monday Wednesday Friday. She denies pain or numbness in the right hand. She does not take anticoagulants.  Past Medical History  Diagnosis Date  . HTN (hypertension)     not well controlled  . 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 History of blood transfusion     "lots of times" (01/27/2013)  . CAP (community acquired pneumonia) 2010  . Type II diabetes mellitus ([place])   . Anemia   . Ovarian tumor, malignant ([place]) 2005    resected in [place]' pt denies that this was cancer on 01/27/2013  . Diabetic retinopathy ([place])   . On home oxygen therapy     "5L; nightttime" (12/18/2015)  . ESRD on hemodialysis ([place]) 08/25/2013    Started HD in 2008.  Had [place] transplant in 2010 at [facility].  Went back on HD in 2012.  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  The kidney was left in.  She now gets HD at [facility] on TTS schedule.     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  Both organs have since rejected, see "ESRD" for details.   . ARF (acute respiratory failure) ([place]) 04/11/2015    with hypoxia    Social History  Substance Use Topics  . Smoking status: Never Smoker   . Smokeless tobacco: Never Used  . Alcohol Use: No    Family History  Problem Relation Age of Onset  . Diabetes Mother   . Liver cancer Father   . Cancer Father     Allergies  Allergen Reactions  . Ace Inhibitors Cough     Current outpatient prescriptions:  .  amLODipine (NORVASC) 10 MG tablet,  Take 10 mg by mouth at bedtime. , Disp: , Rfl:  .  aspirin EC 81 MG tablet, Take 81 mg by mouth at bedtime. , Disp: , Rfl:  .  B Complex-C-Folic Acid (RENA-VITE PO), Take 1 tablet by mouth daily., Disp: , Rfl:  .  calcium acetate (PHOSLO) 667 MG capsule, Take 3 capsules (2,001 mg total) by mouth 3 (three) times daily with meals., Disp: , Rfl:  .  cloNIDine (CATAPRES) 0.1 MG tablet, Take 0.1 mg by mouth 2 (two) times daily. , Disp: , Rfl:  .  diphenhydrAMINE (BENADRYL) 25 mg capsule, Take 25 mg by mouth daily as needed for allergies., Disp: , Rfl:  .  escitalopram (LEXAPRO) 20 MG tablet, Take 10 mg by mouth daily. , Disp: , Rfl:  .  insulin aspart (NOVOLOG) 100 UNIT/ML injection, Inject 12 Units into the skin 2 (two) times daily with breakfast and lunch. , Disp: , Rfl:  .  insulin glargine (LANTUS) 100 UNIT/ML injection, Inject 0.5 mLs (50 Units total) into the skin at bedtime., Disp: 10 mL, Rfl: 11 .  labetalol (NORMODYNE) 300 MG tablet, Take 300 mg by mouth 2 (two) times daily., Disp: , Rfl:  .  metoCLOPramide (REGLAN) 5 MG tablet, Take 5  mg by mouth 4 (four) times daily., Disp: , Rfl:  .  pantoprazole (PROTONIX) 40 MG tablet, Take 40 mg by mouth daily., Disp: , Rfl:  .  oxyCODONE-acetaminophen (PERCOCET/ROXICET) 5-325 MG per tablet, Take 1 tablet by mouth every 4 (four) hours as needed for moderate pain. (Patient not taking: Reported on 02/27/2016), Disp: 30 tablet, Rfl: 0  Filed Vitals:   02/27/16 1322  BP: 193/87  Pulse: 74  Height: 5\' 7"  (1.702 m)  Weight: 175 lb (79.379 kg)  SpO2: 96%    Body mass index is 27.4 kg/(m^2).           Review of Systems denies chest pain, dyspnea on exertion, PND, orthopnea, hemoptysis, claudication. Does have history of hypertension, type 2 diabetes mellitus, diabetic retinopathy. Other systems negative and complete review of systems     Objective:   Physical Exam BP 193/87 mmHg  Pulse 74  Ht 5\' 7"  (1.702 m)  Wt 175 lb (79.379 kg)  BMI  27.40 kg/m2  SpO2 96%  LMP  (LMP Unknown)    Gen.-alert and oriented x3 in no apparent distress HEENT normal for age Lungs no rhonchi or wheezing Cardiovascular regular rhythm no murmurs carotid pulses 3+ palpable no bruits audible Abdomen soft nontender no palpable masses Musculoskeletal free of  major deformities Skin clear -no rashes Neurologic normal Lower extremities 3+ femoral and dorsalis pedis pulses palpable bilaterally with no edema  Left arm with thrombosed upper arm AV graft and no distal edema Right upper extremity with no previous incisions. 3+ brachial and 1-2+ radial pulse palpable right hand. Right hand well-perfused.  Today I ordered arterial study and vein mapping of the right upper extremity which I reviewed and interpreted. There is triphasic flow in the right radial and ulnar arteries. The best vein available is the basilic vein in the right upper arm. The left upper arm cephalic vein either has thrombus or is too small in caliber for fistula creation      Assessment:     End-stage renal disease needs vascular access-failed left upper arm AV graft which was inserted in 2013 Type 2 diabetes mellitus Hypertension Diabetic retinopathy    Plan:     Best plan would be right arm basilic vein transposition Have scheduled this for Wednesday, May 31 That  will require patient receiving dialysis on Monday and Tuesday the 29th and 30th of May  Discussed with patient and she would like to proceed under general anesthesia

## 2016-03-06 NOTE — Anesthesia Preprocedure Evaluation (Addendum)
Anesthesia Evaluation  Patient identified by MRN, date of birth, ID band Patient awake  General Assessment Comment:S/p renal tx  Reviewed: Allergy & Precautions, H&P , NPO status , Patient's Chart, lab work & pertinent test results  Airway Mallampati: II  TM Distance: <3 FB Neck ROM: Full    Dental no notable dental hx. (+) Dental Advisory Given   Pulmonary neg pulmonary ROS,    Pulmonary exam normal breath sounds clear to auscultation       Cardiovascular hypertension, Pt. on medications Normal cardiovascular exam Rhythm:Regular Rate:Normal     Neuro/Psych negative neurological ROS  negative psych ROS   GI/Hepatic negative GI ROS, Neg liver ROS,   Endo/Other  diabetes  Renal/GU DialysisRenal disease  negative genitourinary   Musculoskeletal negative musculoskeletal ROS (+)   Abdominal   Peds negative pediatric ROS (+)  Hematology  (+) Blood dyscrasia, anemia ,   Anesthesia Other Findings   Reproductive/Obstetrics negative OB ROS                             Anesthesia Physical  Anesthesia Plan  ASA: III  Anesthesia Plan: General   Post-op Pain Management:    Induction: Intravenous  Airway Management Planned: LMA  Additional Equipment:   Intra-op Plan:   Post-operative Plan: Extubation in OR  Informed Consent: I have reviewed the patients History and Physical, chart, labs and discussed the procedure including the risks, benefits and alternatives for the proposed anesthesia with the patient or authorized representative who has indicated his/her understanding and acceptance.   Dental advisory given  Plan Discussed with: CRNA  Anesthesia Plan Comments:        Anesthesia Quick Evaluation

## 2016-03-06 NOTE — Transfer of Care (Signed)
Immediate Anesthesia Transfer of Care Note  Patient: [name]  Procedure(s) Performed: Procedure(s): RIGHT ARM BASILIC VEIN TRANSPOSITION (Right)  Patient Location: PACU  Anesthesia Type:General  Level of Consciousness: awake, alert  and oriented  Airway & Oxygen Therapy: Patient Spontanous Breathing and Patient connected to nasal cannula oxygen  Post-op Assessment: Report given to RN and Post -op Vital signs reviewed and stable  Post vital signs: Reviewed and stable  Last Vitals:  Filed Vitals:   03/06/16 0714  BP: 171/70  Pulse: 74  Temp: 36.6 C  Resp: 18    Last Pain: There were no vitals filed for this visit.       Complications: No apparent anesthesia complications

## 2016-03-06 NOTE — Anesthesia Postprocedure Evaluation (Signed)
Anesthesia Post Note  Patient: [name]  Procedure(s) Performed: Procedure(s) (LRB): RIGHT ARM BASILIC VEIN TRANSPOSITION (Right)  Patient location during evaluation: PACU Anesthesia Type: General Level of consciousness: sedated and patient cooperative Pain management: pain level controlled Vital Signs Assessment: post-procedure vital signs reviewed and stable Respiratory status: spontaneous breathing Cardiovascular status: stable Anesthetic complications: no    Last Vitals:  Filed Vitals:   03/06/16 1330 03/06/16 1333  BP:  157/76  Pulse: 85 83  Temp:    Resp: 19 23    Last Pain:  Filed Vitals:   03/06/16 1336  PainSc: [place]

## 2016-03-06 NOTE — Anesthesia Procedure Notes (Signed)
Procedure Name: LMA Insertion Date/Time: 03/06/2016 8:35 AM Performed by: Mariea Clonts Pre-anesthesia Checklist: Patient identified, Timeout performed, Emergency Drugs available, Suction available and Patient being monitored Patient Re-evaluated:Patient Re-evaluated prior to inductionOxygen Delivery Method: Circle system utilized Preoxygenation: Pre-oxygenation with 100% oxygen Intubation Type: IV induction LMA: LMA inserted LMA Size: 4.0 Placement Confirmation: breath sounds checked- equal and bilateral and positive ETCO2 Tube secured with: Tape Dental Injury: Teeth and Oropharynx as per pre-operative assessment

## 2016-03-07 ENCOUNTER — Encounter (HOSPITAL_COMMUNITY): Payer: Self-pay | Admitting: Vascular Surgery

## 2016-04-03 ENCOUNTER — Encounter: Payer: Self-pay | Admitting: Surgery

## 2016-04-15 ENCOUNTER — Encounter: Payer: Medicare Other | Admitting: Surgery

## 2016-04-24 ENCOUNTER — Encounter: Payer: Medicare Other | Admitting: Surgery

## 2016-04-25 ENCOUNTER — Encounter: Payer: Self-pay | Admitting: Vascular Surgery

## 2016-04-30 ENCOUNTER — Ambulatory Visit (INDEPENDENT_AMBULATORY_CARE_PROVIDER_SITE_OTHER): Payer: Medicare Other | Admitting: Vascular Surgery

## 2016-04-30 ENCOUNTER — Encounter: Payer: Self-pay | Admitting: Vascular Surgery

## 2016-04-30 VITALS — BP 150/73 | HR 87 | Temp 97.9°F | Resp 16 | Ht 67.0 in | Wt 175.0 lb

## 2016-04-30 DIAGNOSIS — Z992 Dependence on renal dialysis: Secondary | ICD-10-CM

## 2016-04-30 DIAGNOSIS — N186 End stage renal disease: Secondary | ICD-10-CM

## 2016-04-30 NOTE — Progress Notes (Signed)
   Patient name: [name] MRN: [id number] DOB: Apr 01, 1976 Sex: female  REASON FOR VISIT: post-op  HPI: [name] is a 40 y.o. female who presents for postoperative follow-up status post right basilic vein transposition created 03/06/2016. The patient is currently dialyzing via a right IJ tunneled dialysis catheter. She denies any issues with dialysis.   Current Outpatient Prescriptions  Medication Sig Dispense Refill  . amLODipine (NORVASC) 10 MG tablet Take 10 mg by mouth at bedtime.     [name] aspirin EC 81 MG tablet Take 81 mg by mouth at bedtime.     . B Complex-C-Folic Acid (RENA-VITE PO) Take 1 tablet by mouth at bedtime.     . calcium acetate (PHOSLO) 667 MG capsule Take 3 capsules (2,001 mg total) by mouth 3 (three) times daily with meals.    . cloNIDine (CATAPRES) 0.1 MG tablet Take 0.1 mg by mouth 2 (two) times daily.     . diphenhydrAMINE (BENADRYL) 25 mg capsule Take 25 mg by mouth daily as needed for allergies.    [name] escitalopram (LEXAPRO) 20 MG tablet Take 10 mg by mouth daily.     . insulin aspart (NOVOLOG) 100 UNIT/ML injection Inject 12 Units into the skin 2 (two) times daily with breakfast and lunch.     . insulin glargine (LANTUS) 100 UNIT/ML injection Inject 0.5 mLs (50 Units total) into the skin at bedtime. 10 mL 11  . labetalol (NORMODYNE) 300 MG tablet Take 300 mg by mouth 2 (two) times daily.    . metoCLOPramide (REGLAN) 5 MG tablet Take 5 mg by mouth 4 (four) times daily.    [name] oxyCODONE-acetaminophen (PERCOCET/ROXICET) 5-325 MG tablet Take 1 tablet by mouth every 6 (six) hours as needed. 12 tablet 0  . pantoprazole (PROTONIX) 40 MG tablet Take 40 mg by mouth daily.     No current facility-administered medications for this visit.     REVIEW OF SYSTEMS:  [X]  denotes positive finding, [ ]  denotes negative finding Cardiac  Comments:  Chest pain or chest pressure:    Shortness of breath upon exertion:    Short of breath when lying flat:    Irregular heart rhythm:      Constitutional    Fever or chills:      PHYSICAL EXAM: Vitals:   04/30/16 1510 04/30/16 1514  BP: (!) 147/70 (!) 150/73  Pulse: 87   Resp: 16   Temp: 97.9 F (36.6 C)   TempSrc: Oral   SpO2: (!) 89%   Weight: 175 lb (79.4 kg)   Height: 5\' 7"  (1.702 m)     GENERAL: The patient is a well-nourished female, in no acute distress. The vital signs are documented above. CARDIOVASCULAR: Right IJ tunneled dialysis catheter PULMONARY: Nonlabored respiratory effort VASCULAR: Right upper arm fistula with easily palpable thrill. Right upper arm incisions with scabs. No drainage. No cellulitis.   MEDICAL ISSUES: Status post right arm basilic vein transposition  The patient's access is maturing well. She is currently dialyzing via a right IJ tunneled dialysis catheter. Her right upper arm fistula may be cannulated 3 months from her original surgery. Okay to cannulate on 06/06/2016.  Virgina Jock, PA-C [facility] [place]   I have examined the patient, reviewed and agree with above.  Curt Jews, MD 04/30/2016 3:36 PM

## 2016-05-08 ENCOUNTER — Institutional Professional Consult (permissible substitution): Payer: Medicare Other | Admitting: Pulmonary Disease

## 2016-07-10 ENCOUNTER — Institutional Professional Consult (permissible substitution): Payer: Medicare Other | Admitting: Pulmonary Disease

## 2016-11-15 ENCOUNTER — Ambulatory Visit
Admission: RE | Admit: 2016-11-15 | Discharge: 2016-11-15 | Disposition: A | Discharge status: Home / Self Care | Payer: Medicare Other | Source: Ambulatory Visit | Attending: Nurse Practitioner | Admitting: Nurse Practitioner

## 2016-11-15 ENCOUNTER — Other Ambulatory Visit: Payer: Self-pay | Admitting: Nurse Practitioner

## 2016-11-15 DIAGNOSIS — I96 Gangrene, not elsewhere classified: Secondary | ICD-10-CM

## 2016-12-03 ENCOUNTER — Encounter (HOSPITAL_BASED_OUTPATIENT_CLINIC_OR_DEPARTMENT_OTHER): Payer: Medicare Other | Attending: Surgery

## 2016-12-03 DIAGNOSIS — Z992 Dependence on renal dialysis: Secondary | ICD-10-CM | POA: Insufficient documentation

## 2016-12-03 DIAGNOSIS — Z9483 Pancreas transplant status: Secondary | ICD-10-CM | POA: Diagnosis not present

## 2016-12-03 DIAGNOSIS — L97819 Non-pressure chronic ulcer of other part of right lower leg with unspecified severity: Secondary | ICD-10-CM | POA: Insufficient documentation

## 2016-12-03 DIAGNOSIS — I120 Hypertensive chronic kidney disease with stage 5 chronic kidney disease or end stage renal disease: Secondary | ICD-10-CM | POA: Insufficient documentation

## 2016-12-03 DIAGNOSIS — Z940 Kidney transplant status: Secondary | ICD-10-CM | POA: Diagnosis not present

## 2016-12-03 DIAGNOSIS — Z79891 Long term (current) use of opiate analgesic: Secondary | ICD-10-CM | POA: Insufficient documentation

## 2016-12-03 DIAGNOSIS — N186 End stage renal disease: Secondary | ICD-10-CM | POA: Insufficient documentation

## 2016-12-03 DIAGNOSIS — E11622 Type 2 diabetes mellitus with other skin ulcer: Secondary | ICD-10-CM | POA: Insufficient documentation

## 2016-12-03 DIAGNOSIS — I96 Gangrene, not elsewhere classified: Secondary | ICD-10-CM | POA: Diagnosis not present

## 2016-12-03 DIAGNOSIS — Z7982 Long term (current) use of aspirin: Secondary | ICD-10-CM | POA: Diagnosis not present

## 2016-12-03 DIAGNOSIS — E1152 Type 2 diabetes mellitus with diabetic peripheral angiopathy with gangrene: Secondary | ICD-10-CM | POA: Insufficient documentation

## 2016-12-03 DIAGNOSIS — Z794 Long term (current) use of insulin: Secondary | ICD-10-CM | POA: Diagnosis not present

## 2016-12-03 DIAGNOSIS — E1122 Type 2 diabetes mellitus with diabetic chronic kidney disease: Secondary | ICD-10-CM | POA: Insufficient documentation

## 2016-12-03 DIAGNOSIS — Z79899 Other long term (current) drug therapy: Secondary | ICD-10-CM | POA: Diagnosis not present

## 2017-07-10 ENCOUNTER — Other Ambulatory Visit: Payer: Self-pay

## 2017-07-10 DIAGNOSIS — R200 Anesthesia of skin: Secondary | ICD-10-CM

## 2017-07-10 DIAGNOSIS — R202 Paresthesia of skin: Principal | ICD-10-CM

## 2017-07-25 ENCOUNTER — Ambulatory Visit (HOSPITAL_COMMUNITY)
Admission: RE | Admit: 2017-07-25 | Discharge: 2017-07-25 | Disposition: A | Discharge status: Home / Self Care | Payer: Medicare Other | Source: Ambulatory Visit | Attending: Vascular Surgery | Admitting: Vascular Surgery

## 2017-07-25 DIAGNOSIS — R202 Paresthesia of skin: Secondary | ICD-10-CM

## 2017-07-25 DIAGNOSIS — R200 Anesthesia of skin: Secondary | ICD-10-CM | POA: Insufficient documentation

## 2017-08-01 ENCOUNTER — Ambulatory Visit: Payer: Medicare Other | Admitting: Vascular Surgery

## 2017-08-15 ENCOUNTER — Ambulatory Visit (INDEPENDENT_AMBULATORY_CARE_PROVIDER_SITE_OTHER): Payer: Medicare Other | Admitting: Vascular Surgery

## 2017-08-15 ENCOUNTER — Encounter: Payer: Self-pay | Admitting: Vascular Surgery

## 2017-08-15 ENCOUNTER — Other Ambulatory Visit: Payer: Self-pay | Admitting: *Deleted

## 2017-08-15 ENCOUNTER — Encounter: Payer: Self-pay | Admitting: *Deleted

## 2017-08-15 DIAGNOSIS — I96 Gangrene, not elsewhere classified: Secondary | ICD-10-CM | POA: Insufficient documentation

## 2017-08-15 DIAGNOSIS — T82898A Other specified complication of vascular prosthetic devices, implants and grafts, initial encounter: Secondary | ICD-10-CM

## 2017-08-15 NOTE — H&P (View-Only) (Signed)
Established Dialysis Access   History of Present Illness   [name] is a 41 y.o. (November 04, 1975) female who presents for cc: right hand gangrene.  History was obtained from chart and friend indirectly due to language barrier.  Pt is s/p R BVT by Dr. Kellie Simmering in 03/06/16.  This patient never followed from this procedure.  The patient is right hand dominant.  Previous access procedures have been completed in the L arm also.  These grafts have all occluded.  Reportedly patient was already evaluated in [place] for her right hand gangrene and nothing was done there.  Of note, she was told she still has a patent proximal vein.  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  Past Medical History:  Diagnosis Date  . Anemia   . ARF (acute respiratory failure) ([place]) 04/11/2015   with hypoxia  . CAP (community acquired pneumonia) 2010  . Diabetic retinopathy ([place])   . ESRD on hemodialysis ([place]) 08/25/2013   Started HD in 2008.  Had [place] transplant in 2010 at [facility].  Went back on HD in 2012.  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  The kidney was left in.  She now gets HD at [facility] on TTS schedule.     [name] History of blood transfusion    "lots of times" (01/27/2013)  . 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  Both organs have since rejected, see "ESRD" for details.   [name] HTN (hypertension)    not well controlled  . On home oxygen therapy    "5L; nightttime" (12/18/2015)  . Ovarian tumor, malignant ([place]) 2005   resected in [place]' pt denies that this was cancer on 01/27/2013  . TB (pulmonary tuberculosis) 2003, 2007   history of miliary TB partially treated in 2003, and then completely treated in 2007  . Type II diabetes mellitus ([place])     Past Surgical History:  Procedure Laterality Date  . COMBINED KIDNEY-PANCREAS TRANSPLANT  2010   Archie Endo 01/27/2013; @ [place] Right    "cause of  diabetes" (4/23/20140  . PANCREATECTOMY  12/13/2010   for acute and chronic pancreatitis with abcess formation/notes 01/27/2013  . RENAL BIOPSY  12/13/2010; 10/25/2011   Archie Endo 01/27/2013  . TUMOR REMOVAL  2005   Ovarian, resected in [place] History   Socioeconomic History  . Marital status: Married    Spouse name: Not on file  . Number of children: 1  . Years of education: Not on file  . Highest education level: Not on file  Social Needs  . Financial resource strain: Not on file  . Food insecurity - worry: Not on file  . Food insecurity - inability: Not on file  . Transportation needs - medical: Not on file  . Transportation needs - non-medical: Not on file  Occupational History  . Occupation: Unemployed  Tobacco Use  . Smoking status: Never Smoker  . Smokeless tobacco: Never Used  Substance and Sexual Activity  . Alcohol use: No  . Drug use: No  . Sexual activity: No  Other Topics Concern  . Not on file  Social History Narrative   Lives with husband and son (89yrs old)..Unemployed                Family History  Problem Relation Age of Onset  . Diabetes Mother   .  Liver cancer Father   . Cancer Father     Current Outpatient Medications  Medication Sig Dispense Refill  . amLODipine (NORVASC) 10 MG tablet Take 10 mg by mouth at bedtime.     [name] aspirin EC 81 MG tablet Take 81 mg by mouth at bedtime.     . B Complex-C-Folic Acid (RENA-VITE PO) Take 1 tablet by mouth at bedtime.     . calcium acetate (PHOSLO) 667 MG capsule Take 3 capsules (2,001 mg total) by mouth 3 (three) times daily with meals.    . cloNIDine (CATAPRES) 0.1 MG tablet Take 0.1 mg by mouth 2 (two) times daily.     . diphenhydrAMINE (BENADRYL) 25 mg capsule Take 25 mg by mouth daily as needed for allergies.    [name] escitalopram (LEXAPRO) 20 MG tablet Take 10 mg by mouth daily.     . insulin aspart (NOVOLOG) 100 UNIT/ML injection Inject 12 Units into the skin 2 (two) times daily with breakfast and  lunch.     . insulin glargine (LANTUS) 100 UNIT/ML injection Inject 0.5 mLs (50 Units total) into the skin at bedtime. 10 mL 11  . labetalol (NORMODYNE) 300 MG tablet Take 300 mg by mouth 2 (two) times daily.    . metoCLOPramide (REGLAN) 5 MG tablet Take 5 mg by mouth 4 (four) times daily.    [name] oxyCODONE-acetaminophen (PERCOCET/ROXICET) 5-325 MG tablet Take 1 tablet by mouth every 6 (six) hours as needed. 12 tablet 0  . pantoprazole (PROTONIX) 40 MG tablet Take 40 mg by mouth daily.     No current facility-administered medications for this visit.      Allergies  Allergen Reactions  . Ace Inhibitors Cough    REVIEW OF SYSTEMS (negative unless checked):   Cardiac:  []  Chest pain or chest pressure? []  Shortness of breath upon activity? []  Shortness of breath when lying flat? []  Irregular heart rhythm?  Vascular:  []  Pain in calf, thigh, or hip brought on by walking? []  Pain in feet at night that wakes you up from your sleep? []  Blood clot in your veins? []  Leg swelling?  Pulmonary:  []  Oxygen at home? []  Productive cough? []  Wheezing?  Neurologic:  []  Sudden weakness in arms or legs? []  Sudden numbness in arms or legs? []  Sudden onset of difficult speaking or slurred speech? []  Temporary loss of vision in one eye? []  Problems with dizziness?  Gastrointestinal:  []  Blood in stool? []  Vomited blood?  Genitourinary:  []  Burning when urinating? []  Blood in urine? [x]  end stage renal disease-HD: M/W/F  Psychiatric:  []  Major depression  Hematologic:  []  Bleeding problems? []  Problems with blood clotting?  Dermatologic:  []  Rashes or ulcers?  Constitutional:  []  Fever or chills?  Ear/Nose/Throat:  []  Change in hearing? []  Nose bleeds? []  Sore throat?  Musculoskeletal:  []  Back pain? []  Joint pain? []  Muscle pain?   Physical Examination   Vitals:   08/15/17 1312  BP: 106/65  Pulse: 95  Resp: 18  Temp: 97.7 F (36.5 C)  TempSrc: Oral  SpO2: 99%   Weight: 142 lb (64.4 kg)  Height: 5\' 7"  (1.702 m)   Body mass index is 22.24 kg/m.  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  While banding and DRIL might help salvage the fistula, the optimal way to maximize blood flow to her dominant right hand is: LIGATION OF R BVT, placement of TDC.  Given the language barrier, will go ahead and do a L arm venogram at the same time. The patient is aware the risks of ligation of arteriovenous fistula include but are not limited to: bleeding, infection, termination of the access, and possible nerve damage. The patient is aware the risks of tunneled dialysis catheter placement include but are not limited to: bleeding, infection, central venous injury, pneumothorax, possible venous stenosis, possible malpositioning in the venous system, and possible infections related to long-term catheter presence.  Risk of extremity and central venography include but are not limited to: anaphylaxis, bleeding, infection, and incomplete visualization of the venous system. The patient is aware of these risks of all these procedures and has elected to proceed.   Adele Barthel, MD, FACS [facility]  [place] Office: [phone] Pager: [phone]

## 2017-08-15 NOTE — Progress Notes (Signed)
Established Dialysis Access   History of Present Illness   [name] is a 41 y.o. ([birth year]) female who presents for cc: right hand gangrene.  History was obtained from chart and friend indirectly due to language barrier.  Pt is s/p R BVT by Dr. Kellie Simmering in 03/06/16.  This patient never followed from this procedure.  The patient is right hand dominant.  Previous access procedures have been completed in the L arm also.  These grafts have all occluded.  Reportedly patient was already evaluated in [place] for her right hand gangrene and nothing was done there.  Of note, she was told she still has a patent proximal vein.  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  Past Medical History:  Diagnosis Date  . Anemia   . ARF (acute respiratory failure) ([place]) 04/11/2015   with hypoxia  . CAP (community acquired pneumonia) 2010  . Diabetic retinopathy ([place])   . ESRD on hemodialysis ([place]) 08/25/2013   Started HD in 2008.  Had [place] transplant in 2010 at [facility].  Went back on HD in 2012.  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  The kidney was left in.  She now gets HD at [facility] on TTS schedule.     [name] History of blood transfusion    "lots of times" (01/27/2013)  . 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  Both organs have since rejected, see "ESRD" for details.   [name] HTN (hypertension)    not well controlled  . On home oxygen therapy    "5L; nightttime" (12/18/2015)  . Ovarian tumor, malignant ([place]) 2005   resected in [place]' pt denies that this was cancer on 01/27/2013  . TB (pulmonary tuberculosis) 2003, 2007   history of miliary TB partially treated in 2003, and then completely treated in 2007  . Type II diabetes mellitus ([place])     Past Surgical History:  Procedure Laterality Date  . COMBINED KIDNEY-PANCREAS TRANSPLANT  2010   Archie Endo 01/27/2013; @ [place]    "cause of  diabetes" (4/23/20140  . PANCREATECTOMY  12/13/2010   for acute and chronic pancreatitis with abcess formation/notes 01/27/2013  . RENAL BIOPSY  12/13/2010; 10/25/2011   Archie Endo 01/27/2013  . TUMOR REMOVAL  2005   Ovarian, resected in [place] History   Socioeconomic History  . Marital status: Married    Spouse name: Not on file  . Number of children: 1  . Years of education: Not on file  . Highest education level: Not on file  Social Needs  . Financial resource strain: Not on file  . Food insecurity - worry: Not on file  . Food insecurity - inability: Not on file  . Transportation needs - medical: Not on file  . Transportation needs - non-medical: Not on file  Occupational History  . Occupation: Unemployed  Tobacco Use  . Smoking status: Never Smoker  . Smokeless tobacco: Never Used  Substance and Sexual Activity  . Alcohol use: No  . Drug use: No  . Sexual activity: No  Other Topics Concern  . Not on file  Social History Narrative   Lives with husband and son (25yrs old)..Unemployed                Family History  Problem Relation Age of Onset  . Diabetes Mother   .  Liver cancer Father   . Cancer Father     Current Outpatient Medications  Medication Sig Dispense Refill  . amLODipine (NORVASC) 10 MG tablet Take 10 mg by mouth at bedtime.     [name] aspirin EC 81 MG tablet Take 81 mg by mouth at bedtime.     . B Complex-C-Folic Acid (RENA-VITE PO) Take 1 tablet by mouth at bedtime.     . calcium acetate (PHOSLO) 667 MG capsule Take 3 capsules (2,001 mg total) by mouth 3 (three) times daily with meals.    . cloNIDine (CATAPRES) 0.1 MG tablet Take 0.1 mg by mouth 2 (two) times daily.     . diphenhydrAMINE (BENADRYL) 25 mg capsule Take 25 mg by mouth daily as needed for allergies.    [name] escitalopram (LEXAPRO) 20 MG tablet Take 10 mg by mouth daily.     . insulin aspart (NOVOLOG) 100 UNIT/ML injection Inject 12 Units into the skin 2 (two) times daily with breakfast and  lunch.     . insulin glargine (LANTUS) 100 UNIT/ML injection Inject 0.5 mLs (50 Units total) into the skin at bedtime. 10 mL 11  . labetalol (NORMODYNE) 300 MG tablet Take 300 mg by mouth 2 (two) times daily.    . metoCLOPramide (REGLAN) 5 MG tablet Take 5 mg by mouth 4 (four) times daily.    [name] oxyCODONE-acetaminophen (PERCOCET/ROXICET) 5-325 MG tablet Take 1 tablet by mouth every 6 (six) hours as needed. 12 tablet 0  . pantoprazole (PROTONIX) 40 MG tablet Take 40 mg by mouth daily.     No current facility-administered medications for this visit.      Allergies  Allergen Reactions  . Ace Inhibitors Cough    REVIEW OF SYSTEMS (negative unless checked):   Cardiac:  []  Chest pain or chest pressure? []  Shortness of breath upon activity? []  Shortness of breath when lying flat? []  Irregular heart rhythm?  Vascular:  []  Pain in calf, thigh, or hip brought on by walking? []  Pain in feet at night that wakes you up from your sleep? []  Blood clot in your veins? []  Leg swelling?  Pulmonary:  []  Oxygen at home? []  Productive cough? []  Wheezing?  Neurologic:  []  Sudden weakness in arms or legs? []  Sudden numbness in arms or legs? []  Sudden onset of difficult speaking or slurred speech? []  Temporary loss of vision in one eye? []  Problems with dizziness?  Gastrointestinal:  []  Blood in stool? []  Vomited blood?  Genitourinary:  []  Burning when urinating? []  Blood in urine? [x]  end stage renal disease-HD: M/W/F  Psychiatric:  []  Major depression  Hematologic:  []  Bleeding problems? []  Problems with blood clotting?  Dermatologic:  []  Rashes or ulcers?  Constitutional:  []  Fever or chills?  Ear/Nose/Throat:  []  Change in hearing? []  Nose bleeds? []  Sore throat?  Musculoskeletal:  []  Back pain? []  Joint pain? []  Muscle pain?   Physical Examination   Vitals:   08/15/17 1312  BP: 106/65  Pulse: 95  Resp: 18  Temp: 97.7 F (36.5 C)  TempSrc: Oral  SpO2: 99%   Weight: 142 lb (64.4 kg)  Height: 5\' 7"  (1.702 m)   Body mass index is 22.24 kg/m.  General Alert, O x 3, WD, NAD  Pulmonary Sym exp, good B air movt, CTA B  Cardiac RRR, Nl S1, S2, no Murmurs, No rubs, No S3,S4  Vascular Vessel Right Left  Radial Not palpable Faintly palpable  Brachial Palpable Palpable  Ulnar Not palpable Not  palpable    Musculo- skeletal R hand grip not tested due to gangrene  , strong thrill in R BVT, frank gangrene in multiple finger tips including thumb, L arm with multiple prior AVG  Neurologic Pain and light touch intact in extremities except for decreased sensation in R hand, Motor exam as listed above    Medical Decision Making   [name] is a 41 y.o. 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  While banding and DRIL might help salvage the fistula, the optimal way to maximize blood flow to her dominant right hand is: LIGATION OF R BVT, placement of TDC.  Given the language barrier, will go ahead and do a L arm venogram at the same time. The patient is aware the risks of ligation of arteriovenous fistula include but are not limited to: bleeding, infection, termination of the access, and possible nerve damage. The patient is aware the risks of tunneled dialysis catheter placement include but are not limited to: bleeding, infection, central venous injury, pneumothorax, possible venous stenosis, possible malpositioning in the venous system, and possible infections related to long-term catheter presence.  Risk of extremity and central venography include but are not limited to: anaphylaxis, bleeding, infection, and incomplete visualization of the venous system. The patient is aware of these risks of all these procedures and has elected to proceed.   Adele Barthel, MD, FACS [facility]  [place] Office: [phone] Pager: [phone]

## 2017-08-18 ENCOUNTER — Telehealth: Payer: Self-pay | Admitting: Vascular Surgery

## 2017-08-18 ENCOUNTER — Encounter (HOSPITAL_COMMUNITY): Payer: Self-pay | Admitting: *Deleted

## 2017-08-18 ENCOUNTER — Telehealth: Payer: Self-pay | Admitting: *Deleted

## 2017-08-18 ENCOUNTER — Other Ambulatory Visit: Payer: Self-pay | Admitting: *Deleted

## 2017-08-18 NOTE — Progress Notes (Signed)
Call to patient's spouse with time to arrive at admitting department at 10 am for surgery on 08/19/17 with Dr. Bridgett Larsson reminded to be NPO past MN and PAD from [facility] expected to call and speak to patient at 1300 today according to spouse. Verbalized understanding.

## 2017-08-18 NOTE — Progress Notes (Signed)
Spoke with pt and spouse for pre-op call. Pt denies cardiac history, chest pain or sob. Pt's husband states she had really high potassium before and they thought pt had had a heart attack before the results of the potassium came back. She has never seen a cardiologist per spouse. She is a type 2 diabetic. She states her last A1C was 8.6 in October. They both state that pt does not check her blood sugar at home. Instructed pt to take 1/2 her regular dose of Lantus Insulin tonight, will take 25 units and no Novolog insulin in the AM. Both voiced understanding.

## 2017-08-18 NOTE — Telephone Encounter (Signed)
Call to [place] at [facility] informed of patient time and date of surgery and procedures to be done.

## 2017-08-18 NOTE — Progress Notes (Signed)
Anesthesia chart review:  Patient is a same day work up  Patient is a 41 year old female scheduled for ligation right basilic vein transposition and insertion of tunneled dialysis catheter on 08/19/2017 with Adele Barthel, M.D.  [facility] includes:  cardiac arrest due to hyperkalemia (2016), HTN, DM, tuberculosis (completed treatment in 2007), anemia, ESRD on HD (s/p simultaneous kidney and pancreas tranplants 2010, both rejected), uses home oxygen 5L at night. Never smoker. S/p R arm basilic vein transposition 03/06/16.   Medications include: amlodipine, ASA 81mg , clonidine, novolog, lantus, labetalol, reglan, protonix  Labs will be obtained day of surgery  EKG will be obtained day of surgery  Echo 11/08/14:  - HPI and indications: Limited study for LV function. - Left ventricle: The cavity size was normal. Wall thickness was increased in a pattern of moderate LVH. Systolic function was normal. The estimated ejection fraction was in the range of 50% to 55%. Wall motion was normal; there were no regional wall motion abnormalities. The study is not technically sufficient to allow evaluation of LV diastolic function. - Impressions: Compared to the prior echo on 11/01/2014, the LVEF is now near normal at 50%.  If labs and EKG acceptable, I anticipate patient can proceed as scheduled.   Willeen Cass, FNP-BC [facility]/Anesthesiology Phone: [phone] 08/18/2017 4:45 PM

## 2017-08-18 NOTE — Telephone Encounter (Signed)
-----   Message from Conrad [place], MD sent at 08/15/2017  3:40 PM EST ----- Regarding: RE: gangrene Routine appt with [facility] should be ok, since this appears to be chronic  ----- Message ----- From: Georgiann Mccoy Sent: 08/15/2017   2:41 PM To: Conrad [place], MD Subject: gangrene                                       I spoke to [facility] the triage nurse at the [facility] [place]. She saw Dr. Burney Gauze in April. Jeani Hawking spoke to Dr. Fredna Dow who is on call and he said that he did not thing it was urgent.   She said that she will not make a future appointment for this patient until Dr. Burney Gauze approves it.  She said if you can call Dr. Fredna Dow if you want to.

## 2017-08-19 ENCOUNTER — Ambulatory Visit (HOSPITAL_COMMUNITY): Payer: Medicare Other

## 2017-08-19 ENCOUNTER — Encounter (HOSPITAL_COMMUNITY): Payer: Self-pay | Admitting: *Deleted

## 2017-08-19 ENCOUNTER — Ambulatory Visit (HOSPITAL_COMMUNITY)
Admission: RE | Admit: 2017-08-19 | Discharge: 2017-08-19 | Disposition: A | Discharge status: Home / Self Care | Payer: Medicare Other | Source: Ambulatory Visit | Attending: Vascular Surgery | Admitting: Vascular Surgery

## 2017-08-19 ENCOUNTER — Encounter (HOSPITAL_COMMUNITY)
Admission: RE | Disposition: A | Discharge status: Home / Self Care | Payer: Self-pay | Source: Ambulatory Visit | Attending: Vascular Surgery

## 2017-08-19 ENCOUNTER — Ambulatory Visit (HOSPITAL_COMMUNITY): Payer: Medicare Other | Admitting: Emergency Medicine

## 2017-08-19 DIAGNOSIS — Y832 Surgical operation with anastomosis, bypass or graft as the cause of abnormal reaction of the patient, or of later complication, without mention of misadventure at the time of the procedure: Secondary | ICD-10-CM | POA: Diagnosis not present

## 2017-08-19 DIAGNOSIS — Z794 Long term (current) use of insulin: Secondary | ICD-10-CM | POA: Insufficient documentation

## 2017-08-19 DIAGNOSIS — E1122 Type 2 diabetes mellitus with diabetic chronic kidney disease: Secondary | ICD-10-CM | POA: Insufficient documentation

## 2017-08-19 DIAGNOSIS — Z940 Kidney transplant status: Secondary | ICD-10-CM | POA: Insufficient documentation

## 2017-08-19 DIAGNOSIS — I96 Gangrene, not elsewhere classified: Secondary | ICD-10-CM | POA: Diagnosis not present

## 2017-08-19 DIAGNOSIS — D631 Anemia in chronic kidney disease: Secondary | ICD-10-CM | POA: Diagnosis not present

## 2017-08-19 DIAGNOSIS — Z7982 Long term (current) use of aspirin: Secondary | ICD-10-CM | POA: Diagnosis not present

## 2017-08-19 DIAGNOSIS — Z992 Dependence on renal dialysis: Secondary | ICD-10-CM | POA: Insufficient documentation

## 2017-08-19 DIAGNOSIS — E11319 Type 2 diabetes mellitus with unspecified diabetic retinopathy without macular edema: Secondary | ICD-10-CM | POA: Insufficient documentation

## 2017-08-19 DIAGNOSIS — I120 Hypertensive chronic kidney disease with stage 5 chronic kidney disease or end stage renal disease: Secondary | ICD-10-CM | POA: Insufficient documentation

## 2017-08-19 DIAGNOSIS — Z9981 Dependence on supplemental oxygen: Secondary | ICD-10-CM | POA: Diagnosis not present

## 2017-08-19 DIAGNOSIS — T82898A Other specified complication of vascular prosthetic devices, implants and grafts, initial encounter: Secondary | ICD-10-CM | POA: Diagnosis not present

## 2017-08-19 DIAGNOSIS — N186 End stage renal disease: Secondary | ICD-10-CM | POA: Diagnosis not present

## 2017-08-19 DIAGNOSIS — Z419 Encounter for procedure for purposes other than remedying health state, unspecified: Secondary | ICD-10-CM

## 2017-08-19 DIAGNOSIS — Z9483 Pancreas transplant status: Secondary | ICD-10-CM | POA: Diagnosis not present

## 2017-08-19 HISTORY — PX: REVISON OF ARTERIOVENOUS FISTULA: SHX6074

## 2017-08-19 HISTORY — PX: VENOGRAM: SHX5497

## 2017-08-19 HISTORY — PX: INSERTION OF DIALYSIS CATHETER: SHX1324

## 2017-08-19 LAB — POCT I-STAT 4, (NA,K, GLUC, HGB,HCT)
Glucose, Bld: 100 mg/dL — ABNORMAL HIGH (ref 65–99)
HEMATOCRIT: 32 % — AB (ref 36.0–46.0)
Hemoglobin: 10.9 g/dL — ABNORMAL LOW (ref 12.0–15.0)
Potassium: 5.3 mmol/L — ABNORMAL HIGH (ref 3.5–5.1)
Sodium: 132 mmol/L — ABNORMAL LOW (ref 135–145)

## 2017-08-19 LAB — HCG, SERUM, QUALITATIVE: Preg, Serum: NEGATIVE

## 2017-08-19 LAB — GLUCOSE, CAPILLARY
Glucose-Capillary: 102 mg/dL — ABNORMAL HIGH (ref 65–99)
Glucose-Capillary: 100 mg/dL — ABNORMAL HIGH (ref 65–99)

## 2017-08-19 LAB — SURGICAL PCR SCREEN
MRSA, PCR: NEGATIVE
STAPHYLOCOCCUS AUREUS: NEGATIVE

## 2017-08-19 SURGERY — REVISON OF ARTERIOVENOUS FISTULA
Anesthesia: Monitor Anesthesia Care | Site: Neck | Laterality: Right

## 2017-08-19 MED ORDER — DEXAMETHASONE SODIUM PHOSPHATE 10 MG/ML IJ SOLN
INTRAMUSCULAR | Status: DC | PRN
  Administered 2017-08-19: 4 mg via INTRAVENOUS

## 2017-08-19 MED ORDER — FENTANYL CITRATE (PF) 100 MCG/2ML IJ SOLN
INTRAMUSCULAR | Status: AC
  Administered 2017-08-19: 50 ug via INTRAVENOUS
  Filled 2017-08-19: qty 2

## 2017-08-19 MED ORDER — LIDOCAINE HCL (PF) 1 % IJ SOLN
INTRAMUSCULAR | Status: DC | PRN
  Administered 2017-08-19: 20 mL
  Administered 2017-08-19: 5 mL

## 2017-08-19 MED ORDER — SODIUM CHLORIDE 0.9 % IV SOLN
INTRAVENOUS | Status: DC | PRN
  Administered 2017-08-19: 500 mL

## 2017-08-19 MED ORDER — DEXAMETHASONE SODIUM PHOSPHATE 10 MG/ML IJ SOLN
INTRAMUSCULAR | Status: AC
  Filled 2017-08-19: qty 1

## 2017-08-19 MED ORDER — FENTANYL CITRATE (PF) 100 MCG/2ML IJ SOLN
INTRAMUSCULAR | Status: DC | PRN
  Administered 2017-08-19 (×2): 25 ug via INTRAVENOUS

## 2017-08-19 MED ORDER — OXYCODONE-ACETAMINOPHEN 5-325 MG PO TABS
1.0000 | ORAL_TABLET | Freq: Once | ORAL | Status: AC | PRN
  Administered 2017-08-19: 2 via ORAL

## 2017-08-19 MED ORDER — LIDOCAINE HCL (PF) 1 % IJ SOLN
INTRAMUSCULAR | Status: AC
  Filled 2017-08-19: qty 30

## 2017-08-19 MED ORDER — DEXTROSE 5 % IV SOLN
INTRAVENOUS | Status: AC
  Filled 2017-08-19: qty 1.5

## 2017-08-19 MED ORDER — LIDOCAINE HCL 1 % IJ SOLN
INTRAMUSCULAR | Status: AC
  Filled 2017-08-19: qty 20

## 2017-08-19 MED ORDER — EPHEDRINE 5 MG/ML INJ
INTRAVENOUS | Status: AC
  Filled 2017-08-19: qty 10

## 2017-08-19 MED ORDER — ONDANSETRON HCL 4 MG/2ML IJ SOLN
INTRAMUSCULAR | Status: DC | PRN
  Administered 2017-08-19: 4 mg via INTRAVENOUS

## 2017-08-19 MED ORDER — MUPIROCIN 2 % EX OINT
TOPICAL_OINTMENT | CUTANEOUS | Status: AC
Start: 2017-08-19 — End: 2017-08-19
  Administered 2017-08-19: 1 via TOPICAL
  Filled 2017-08-19: qty 22

## 2017-08-19 MED ORDER — PROTAMINE SULFATE 10 MG/ML IV SOLN
INTRAVENOUS | Status: AC
  Filled 2017-08-19: qty 10

## 2017-08-19 MED ORDER — FENTANYL CITRATE (PF) 100 MCG/2ML IJ SOLN
INTRAMUSCULAR | Status: AC
  Filled 2017-08-19: qty 2

## 2017-08-19 MED ORDER — PROPOFOL 500 MG/50ML IV EMUL
INTRAVENOUS | Status: DC | PRN
  Administered 2017-08-19: 150 ug/kg/min via INTRAVENOUS

## 2017-08-19 MED ORDER — 0.9 % SODIUM CHLORIDE (POUR BTL) OPTIME
TOPICAL | Status: DC | PRN
  Administered 2017-08-19: 1000 mL

## 2017-08-19 MED ORDER — HEPARIN SODIUM (PORCINE) 1000 UNIT/ML IJ SOLN
INTRAMUSCULAR | Status: DC | PRN
  Administered 2017-08-19: 1000 [IU]

## 2017-08-19 MED ORDER — OXYCODONE-ACETAMINOPHEN 5-325 MG PO TABS
ORAL_TABLET | ORAL | Status: AC
  Administered 2017-08-19: 2 via ORAL
  Filled 2017-08-19: qty 2

## 2017-08-19 MED ORDER — FENTANYL CITRATE (PF) 250 MCG/5ML IJ SOLN
INTRAMUSCULAR | Status: AC
  Filled 2017-08-19: qty 5

## 2017-08-19 MED ORDER — ONDANSETRON HCL 4 MG/2ML IJ SOLN: 4.0000 mg | Freq: Once | INTRAMUSCULAR | Status: DC | PRN

## 2017-08-19 MED ORDER — LIDOCAINE 2% (20 MG/ML) 5 ML SYRINGE
INTRAMUSCULAR | Status: AC
  Filled 2017-08-19: qty 10

## 2017-08-19 MED ORDER — DEXTROSE 5 % IV SOLN
1.5000 g | INTRAVENOUS | Status: AC
  Administered 2017-08-19: 1.5 g via INTRAVENOUS

## 2017-08-19 MED ORDER — IODIXANOL 320 MG/ML IV SOLN
INTRAVENOUS | Status: DC | PRN
  Administered 2017-08-19: 20 mL via INTRAVENOUS

## 2017-08-19 MED ORDER — ONDANSETRON HCL 4 MG/2ML IJ SOLN
INTRAMUSCULAR | Status: AC
  Filled 2017-08-19: qty 2

## 2017-08-19 MED ORDER — FENTANYL CITRATE (PF) 100 MCG/2ML IJ SOLN
25.0000 ug | INTRAMUSCULAR | Status: DC | PRN
  Administered 2017-08-19 (×3): 50 ug via INTRAVENOUS

## 2017-08-19 MED ORDER — MUPIROCIN 2 % EX OINT
1.0000 "application " | TOPICAL_OINTMENT | Freq: Once | CUTANEOUS | Status: AC
  Administered 2017-08-19: 1 via TOPICAL

## 2017-08-19 MED ORDER — MIDAZOLAM HCL 2 MG/2ML IJ SOLN
INTRAMUSCULAR | Status: AC
  Filled 2017-08-19: qty 2

## 2017-08-19 MED ORDER — SODIUM CHLORIDE 0.9 % IV SOLN
INTRAVENOUS | Status: DC
  Administered 2017-08-19 (×2): via INTRAVENOUS

## 2017-08-19 MED ORDER — OXYCODONE-ACETAMINOPHEN 5-325 MG PO TABS: 1.0000 | ORAL_TABLET | ORAL | 0 refills | Status: DC | PRN

## 2017-08-19 MED ORDER — LIDOCAINE 2% (20 MG/ML) 5 ML SYRINGE
INTRAMUSCULAR | Status: DC | PRN
  Administered 2017-08-19: 40 mg via INTRAVENOUS

## 2017-08-19 MED ORDER — HEPARIN SODIUM (PORCINE) 1000 UNIT/ML IJ SOLN
INTRAMUSCULAR | Status: AC
Start: 2017-08-19 — End: 2017-08-19
  Filled 2017-08-19: qty 1

## 2017-08-19 MED ORDER — PROPOFOL 10 MG/ML IV BOLUS
INTRAVENOUS | Status: AC
  Filled 2017-08-19: qty 20

## 2017-08-19 SURGICAL SUPPLY — 58 items
ARMBAND PINK RESTRICT EXTREMIT (MISCELLANEOUS) ×5 IMPLANT
BAG DECANTER FOR FLEXI CONT (MISCELLANEOUS) ×5 IMPLANT
BIOPATCH RED 1 DISK 7.0 (GAUZE/BANDAGES/DRESSINGS) ×4 IMPLANT
BIOPATCH RED 1IN DISK 7.0MM (GAUZE/BANDAGES/DRESSINGS) ×1
CANISTER SUCT 3000ML PPV (MISCELLANEOUS) ×5 IMPLANT
CATH PALINDROME RT-P 15FX19CM (CATHETERS) IMPLANT
CATH PALINDROME RT-P 15FX23CM (CATHETERS) IMPLANT
CATH PALINDROME RT-P 15FX28CM (CATHETERS) ×5 IMPLANT
CATH PALINDROME RT-P 15FX55CM (CATHETERS) IMPLANT
CATH STRAIGHT 5FR 65CM (CATHETERS) IMPLANT
CLIP VESOCCLUDE MED 6/CT (CLIP) ×5 IMPLANT
CLIP VESOCCLUDE SM WIDE 6/CT (CLIP) ×5 IMPLANT
COVER PROBE W GEL 5X96 (DRAPES) ×5 IMPLANT
COVER SURGICAL LIGHT HANDLE (MISCELLANEOUS) ×5 IMPLANT
DECANTER SPIKE VIAL GLASS SM (MISCELLANEOUS) ×5 IMPLANT
DERMABOND ADVANCED (GAUZE/BANDAGES/DRESSINGS) ×4
DERMABOND ADVANCED .7 DNX12 (GAUZE/BANDAGES/DRESSINGS) ×6 IMPLANT
DRAPE C-ARM 42X72 X-RAY (DRAPES) ×5 IMPLANT
DRAPE CHEST BREAST 15X10 FENES (DRAPES) ×5 IMPLANT
DRSG COVADERM 4X6 (GAUZE/BANDAGES/DRESSINGS) ×5 IMPLANT
ELECT REM PT RETURN 9FT ADLT (ELECTROSURGICAL) ×5
ELECTRODE REM PT RTRN 9FT ADLT (ELECTROSURGICAL) ×3 IMPLANT
GAUZE SPONGE 4X4 16PLY XRAY LF (GAUZE/BANDAGES/DRESSINGS) ×5 IMPLANT
GLOVE BIO SURGEON STRL SZ7 (GLOVE) ×5 IMPLANT
GLOVE BIOGEL PI IND STRL 7.5 (GLOVE) ×3 IMPLANT
GLOVE BIOGEL PI INDICATOR 7.5 (GLOVE) ×2
GOWN STRL REUS W/ TWL LRG LVL3 (GOWN DISPOSABLE) ×9 IMPLANT
GOWN STRL REUS W/TWL LRG LVL3 (GOWN DISPOSABLE) ×6
HEMOSTAT SPONGE AVITENE ULTRA (HEMOSTASIS) IMPLANT
KIT BASIN OR (CUSTOM PROCEDURE TRAY) ×5 IMPLANT
KIT ROOM TURNOVER OR (KITS) ×5 IMPLANT
NEEDLE 18GX1X1/2 (RX/OR ONLY) (NEEDLE) ×5 IMPLANT
NEEDLE HYPO 25GX1X1/2 BEV (NEEDLE) ×5 IMPLANT
NS IRRIG 1000ML POUR BTL (IV SOLUTION) ×5 IMPLANT
PACK CV ACCESS (CUSTOM PROCEDURE TRAY) ×5 IMPLANT
PACK SURGICAL SETUP 50X90 (CUSTOM PROCEDURE TRAY) ×5 IMPLANT
PAD ARMBOARD 7.5X6 YLW CONV (MISCELLANEOUS) ×10 IMPLANT
SET MICROPUNCTURE 5F STIFF (MISCELLANEOUS) ×10 IMPLANT
SOAP 2 % CHG 4 OZ (WOUND CARE) ×5 IMPLANT
STAPLER VISISTAT 35W (STAPLE) IMPLANT
SUT ETHILON 3 0 PS 1 (SUTURE) ×5 IMPLANT
SUT MNCRL AB 4-0 PS2 18 (SUTURE) ×5 IMPLANT
SUT PROLENE 5 0 C 1 24 (SUTURE) IMPLANT
SUT PROLENE 6 0 BV (SUTURE) IMPLANT
SUT PROLENE 7 0 BV 1 (SUTURE) ×5 IMPLANT
SUT SILK 0 TIES 10X30 (SUTURE) ×5 IMPLANT
SUT VIC AB 3-0 SH 27 (SUTURE) ×2
SUT VIC AB 3-0 SH 27X BRD (SUTURE) ×3 IMPLANT
SYR 10ML LL (SYRINGE) ×5 IMPLANT
SYR 20CC LL (SYRINGE) ×10 IMPLANT
SYR 3ML LL SCALE MARK (SYRINGE) ×5 IMPLANT
SYR 5ML LL (SYRINGE) ×5 IMPLANT
SYR CONTROL 10ML LL (SYRINGE) ×5 IMPLANT
SYRINGE 20CC LL (MISCELLANEOUS) ×15 IMPLANT
TOWEL GREEN STERILE FF (TOWEL DISPOSABLE) ×5 IMPLANT
UNDERPAD 30X30 (UNDERPADS AND DIAPERS) ×5 IMPLANT
WATER STERILE IRR 1000ML POUR (IV SOLUTION) ×5 IMPLANT
WIRE AMPLATZ SS-J .035X180CM (WIRE) IMPLANT

## 2017-08-19 NOTE — Anesthesia Procedure Notes (Signed)
Procedure Name: MAC Date/Time: 08/19/2017 1:14 PM Performed by: Imagene Riches, CRNA Pre-anesthesia Checklist: Patient identified, Emergency Drugs available, Suction available and Patient being monitored Patient Re-evaluated:Patient Re-evaluated prior to induction Oxygen Delivery Method: Simple face mask Preoxygenation: Pre-oxygenation with 100% oxygen Induction Type: IV induction

## 2017-08-19 NOTE — Anesthesia Preprocedure Evaluation (Signed)
Anesthesia Evaluation  Patient identified by MRN, date of birth, ID band Patient awake    Reviewed: Allergy & Precautions, NPO status , Patient's Chart, lab work & pertinent test results, reviewed documented beta blocker date and time   Airway Mallampati: III  TM Distance: >3 FB Neck ROM: Full    Dental  (+) Dental Advisory Given, Teeth Intact, Chipped   Pulmonary  Hx TB s/p tx 2007   Pulmonary exam normal breath sounds clear to auscultation       Cardiovascular hypertension, Pt. on home beta blockers and Pt. on medications (-) Past MI negative cardio ROS Normal cardiovascular exam Rhythm:Regular Rate:Normal     Neuro/Psych negative neurological ROS  negative psych ROS   GI/Hepatic negative GI ROS, Neg liver ROS,   Endo/Other  diabetes, Well Controlled, Type 2, Insulin Dependent  Renal/GU ESRFRenal diseaseS/p renal transplant (rejection)  negative genitourinary   Musculoskeletal negative musculoskeletal ROS (+)   Abdominal   Peds  Hematology  (+) anemia ,   Anesthesia Other Findings   Reproductive/Obstetrics                             Anesthesia Physical Anesthesia Plan  ASA: III  Anesthesia Plan: MAC   Post-op Pain Management:    Induction: Intravenous  PONV Risk Score and Plan: Propofol infusion and Treatment may vary due to age or medical condition  Airway Management Planned: Nasal Cannula  Additional Equipment: None  Intra-op Plan:   Post-operative Plan:   Informed Consent: I have reviewed the patients History and Physical, chart, labs and discussed the procedure including the risks, benefits and alternatives for the proposed anesthesia with the patient or authorized representative who has indicated his/her understanding and acceptance.   Dental advisory given  Plan Discussed with: CRNA  Anesthesia Plan Comments:         Anesthesia Quick Evaluation

## 2017-08-19 NOTE — Transfer of Care (Signed)
Immediate Anesthesia Transfer of Care Note  Patient: [name]  Procedure(s) Performed: LIGATion RIGHT BASILIC VEIN TRANSPOSITION (Right Arm Lower) INSERTION OFTUNNELED  DIALYSIS CATHETER (Left Neck) Left arm and Central VENOGRAM (Left Arm Lower)  Patient Location: PACU  Anesthesia Type:General  Level of Consciousness: awake, alert , oriented and patient cooperative  Airway & Oxygen Therapy: Patient Spontanous Breathing  Post-op Assessment: Report given to RN and Post -op Vital signs reviewed and stable  Post vital signs: Reviewed and stable  Last Vitals:  Vitals:   08/19/17 1106 08/19/17 1455  BP: (!) 157/58 (!) 142/72  Pulse:  71  Resp:  17  Temp:  36.5 C  SpO2:  98%    Last Pain:  Vitals:   08/19/17 1049  TempSrc: Oral         Complications: No apparent anesthesia complications

## 2017-08-19 NOTE — Interval H&P Note (Signed)
History and Physical Interval Note:  08/19/2017 10:24 AM  [name]  has presented today for surgery, with the diagnosis of END STAGE RENAL DISEASE  NI8.6  The various methods of treatment have been discussed with the patient and family. After consideration of risks, benefits and other options for treatment, the patient has consented to  Procedure(s): LIGATE RIGHT BASILIC VEIN TRANSPOSITION (Right) INSERTION OFTUNNELED  DIALYSIS CATHETER (N/A) as a surgical intervention .  The patient's history has been reviewed, patient examined, no change in status, stable for surgery.  I have reviewed the patient's chart and labs.  Questions were answered to the patient's satisfaction.     Adele Barthel

## 2017-08-19 NOTE — Anesthesia Postprocedure Evaluation (Signed)
Anesthesia Post Note  Patient: [name]  Procedure(s) Performed: LIGATion RIGHT BASILIC VEIN TRANSPOSITION (Right Arm Lower) INSERTION OFTUNNELED  DIALYSIS CATHETER (Left Neck) Left arm and Central VENOGRAM (Left Arm Lower)     Patient location during evaluation: PACU Anesthesia Type: MAC Level of consciousness: awake and alert Pain management: pain level controlled Vital Signs Assessment: post-procedure vital signs reviewed and stable Respiratory status: spontaneous breathing, nonlabored ventilation and respiratory function stable Cardiovascular status: stable and blood pressure returned to baseline Anesthetic complications: no    Last Vitals:  Vitals:   08/19/17 1525 08/19/17 1527  BP: (!) 160/70 (!) 164/75  Pulse: 77   Resp: 14   Temp: 36.5 C   SpO2: 95% 93%    Last Pain:  Vitals:   08/19/17 1525  TempSrc:   PainSc: [place]

## 2017-08-19 NOTE — Op Note (Signed)
OPERATIVE NOTE  PROCEDURE: 1.  Left internal jugular vein tunneled dialysis catheter placement 2.  Bilateral internal jugular vein cannulation under ultrasound guidance 3.  Left arm and central venogram 4.  Ligation of right basilic vein transposition   PRE-OPERATIVE DIAGNOSIS: end-stage renal failure, steal syndrome right arm  POST-OPERATIVE DIAGNOSIS: same as above  SURGEON: Adele Barthel, MD  ANESTHESIA: local and MAC  ESTIMATED BLOOD LOSS: 30 cc  FINDING(S): 1.  J-wire would not traverse proximal internal jugular vein, suggesting possible occlusion versus high grade stenosis 2.  Tips of the catheter in the right atrium on fluoroscopy 3.  No obvious pneumothorax on fluoroscopy 4.  Patent left central venous structure with patent brachial vein draining into axillary vein. 5.  Faintly palpable radial pulse at end of case 6.  Externally diseased brachial artery at antecubitum.  SPECIMEN(S):  none  INDICATIONS:   [name] is a 41 y.o. female who presents with right hand gangrene with prior basilic vein transposition and reportedly patent left axillary vein.  The patient presents for tunneled dialysis catheter placement, left arm and central venogram, and ligation of right basilic vein transposition.  The patient is aware the risks of tunneled dialysis catheter placement include but are not limited to: bleeding, infection, central venous injury, pneumothorax, possible venous stenosis, possible malpositioning in the venous system, and possible infections related to long-term catheter presence.  Risk of extremity and central venography include but are not limited to: anaphylaxis, bleeding, infection, and incomplete visualization of the venous system.  Risk iof ligation of access include but are not limited to: termination of access, bleeding, infection, and need for additional procedures.  The patient is aware of these risks and has elected to proceed.   DESCRIPTION: After written  full informed consent was obtained from the patient, the patient was taken back to the operating room.  Prior to induction, the patient was given IV antibiotics.  After obtaining adequate sedation, the patient was prepped and draped in the standard fashion for a chest or neck tunneled dialysis catheter placement.     I inject 5 cc of 1% lidocaine without epinephrine over the right neck cannulation site.  Under Sonosite guidance, I cannulated the right internal jugular vein without difficulty.  The vein appeared sclerotic with a non-pulsatile appearance, so I had concerns it was occluded proximally.  The J-wire was introduced into the internal jugular vein but would not pass proximally.  I removed the wire and needle and held pressure for 3 minutes.    I turned my attention to the left neck.  The cannulation site, the catheter exit site, and tract for the subcutaneous tunnel were then anesthestized with a total of 20 cc of 1% lidocaine without epinephrine.  Under ultrasound guidance, the left internal jugular vein was cannulated with the 18 gauge needle.  A J-wire was then placed down into the inferior vena cava under fluoroscopic guidance.  The wire was then secured in place with a clamp to the drapes.  The tapered wire guide was left occluding the lumen of the needle to avoid air embolism.    I then made stab incisions at the neck and exit sites.   I dissected from the exit site to the cannulation site with a tunneler.   The wire was then unclamped and I removed the needle.  The skin tract and venotomy was dilated serially with graduated dilators, passed under fluoroscopic guidance.   Finally, the dilator-sheath was placed under fluoroscopic guidance  into the superior vena cava.  The central dilator and wire were removed.  A 28 cm Palindrome catheter was placed under fluoroscopic guidance down into the right atrium.    The sheath was broken and peeled away while holding the catheter cuff at the level of the  skin.  The catheter was clamped with the plastic clamp and the back end of this catheter was transected.  One lumen was docked onto the tunneler.  The catheter was delivered through the subcutaneous tunnel by pulling the tunneler out the exit site.  The catheter was reclamped and was transected a second time, revealing the two lumens of this catheter.  The catheter collar was loaded over the back end of the catheter.  The two  ports were docked onto these two lumens.  The catheter collar was then snapped into place, securing the two ports.  Each port was tested by aspirating and flushing.  No resistance was noted.  Each port was then thoroughly flushed with heparinized saline.  The catheter was secured in placed with two interrupted stitches of 3-0 Nylon tied to the catheter.  The neck incision was closed with a U-stitch of 4-0 Monocryl.  The neck and chest incision were cleaned.  The closed neck incision was reinforced with Dermabond and sterile bandages applied to the catheter exit site.  Each port was then loaded with concentrated heparin (1000 Units/mL) at the manufacturer recommended volumes to each port.  Sterile caps were applied to each port.    On completion fluoroscopy, the tips of the catheter were in the right atrium, and there was no evidence of pneumothorax.  At this point, the drapes were removed.  I injected contrast via her left antecubital IV under fluoroscopic guidance.  This demonstrated patent left central venous structures with a patent high brachial vein.  The patient was reposition for a right arm access procedure.  She reprepped and redraped for such.  I injected 10 cc of 1% lidocaine without epinephrine to obtain a field block at the antecubitum.  I made an incision through the prior incision.  I slowly dissected out the basilic vein transposition's anastomosis.  This was difficult due to scar tissue.  Eventually, I had the anastomosis dissected out.  The brachial artery was obvious  diseased and small, 2.5-3.0 mm.  I tied 2 0-silk ties around the distal anastomosis, leaving a vein patch on the brachial artery.  I then ligated the fistula more proximally and then transected the fistula.  This terminated the thrill in this fistula.  Distally, I could feel a faint radial pulse.  This was confirmed with a continuous doppler: strong monophasic signal.  I washed out the incision and then the subcutaneous tissue was reapproximated with a 3-0 Vicryl.  The skin was reapproximated with a running subcuticular stitch of 4-0 Monocryl.  The skin was cleaned, dried, and reinforced with Dermabond.   COMPLICATIONS: none  CONDITION: stable   Adele Barthel, MD, [facility] [place] Office: [phone] Pager: [phone]  08/19/2017, 1:56 PM

## 2017-08-20 ENCOUNTER — Encounter (HOSPITAL_COMMUNITY): Payer: Self-pay | Admitting: Vascular Surgery

## 2017-08-21 ENCOUNTER — Telehealth: Payer: Self-pay | Admitting: Vascular Surgery

## 2017-08-21 NOTE — Telephone Encounter (Signed)
-----   Message from Mena Goes, RN sent at 08/19/2017  3:16 PM EST ----- Regarding: 8 weeks   ----- Message ----- From: Conrad [place], MD Sent: 08/19/2017   2:45 PM To: [facility] [address]  DORATHY STALLONE [phone] Oct 22, 1975   PROCEDURE: 1.  Left internal jugular vein tunneled dialysis catheter placement 2.  Bilateral internal jugular vein cannulation under ultrasound guidance 3.  Left arm and central venogram 4.  Ligation of right basilic vein transposition   Asst: Linus Orn, CSA  Follow-up: 8 weeks

## 2017-08-21 NOTE — Telephone Encounter (Signed)
Sched appt 10/17/17 at 10:45. Spoke to husband translating for pt. Had to resch to 10/24/17 at 12:30.

## 2017-09-02 ENCOUNTER — Encounter: Payer: Self-pay | Admitting: Family

## 2017-09-02 ENCOUNTER — Ambulatory Visit (INDEPENDENT_AMBULATORY_CARE_PROVIDER_SITE_OTHER): Payer: Medicare Other | Admitting: Family

## 2017-09-02 VITALS — BP 139/70 | HR 81 | Temp 98.0°F | Resp 18 | Wt 149.1 lb

## 2017-09-02 DIAGNOSIS — N186 End stage renal disease: Secondary | ICD-10-CM

## 2017-09-02 DIAGNOSIS — I96 Gangrene, not elsewhere classified: Secondary | ICD-10-CM

## 2017-09-02 DIAGNOSIS — Z992 Dependence on renal dialysis: Secondary | ICD-10-CM

## 2017-09-02 DIAGNOSIS — T82898D Other specified complication of vascular prosthetic devices, implants and grafts, subsequent encounter: Secondary | ICD-10-CM

## 2017-09-02 DIAGNOSIS — R21 Rash and other nonspecific skin eruption: Secondary | ICD-10-CM

## 2017-09-02 NOTE — H&P (View-Only) (Signed)
Postoperative Access Visit   History of Present Illness  [name] is a 41 y.o. year old female who is s/p left internal jugular vein tunneled dialysis catheter placement, bilateral internal jugular vein cannulation under ultrasound guidance, left arm and central venogram, and ligation of right basilic vein transposition on 08-19-17 by Dr. Bridgett Larsson for steal syndrome right arm. She returns today, referred by Dr. Otelia Santee, for papular rash on access arm that occurred after this last surgery. Pt states she noticed this right after the ligation of the right arm access. She denies dyspnea, denies swelling of her tongue or lips. She states the right lower arm rash is pruritic, but that the rash is improving.  She and her husband also want to have another permanent access placed before she is scheduled to see Dr. Bridgett Larsson on 10-24-17, states if they cannot get a sooner appointment they will go to [place] to have an access placed.   Pt's husband expressed concern that she may be allergic to Latex or the sutures.  From 08-19-17 surgery record: subcutaneous tissue was reapproximated with a 3-0 Vicryl.  The skin was reapproximated with a running subcuticular stitch of 4-0 Monocryl.  The skin was cleaned, dried, and reinforced with Dermabond.   She dialyzes on M-W-F via left IJ tunneled dialysis catheter.   She has an 8 weeks follow up with Dr. Bridgett Larsson on 10-24-17.  Pt states her right hand looks and feels no different than before the surgery, but I am unable to ascertain if she has pain, or tingling, or numbness in her right forearm/hand, pt states "it's the same".  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   She has had what appears to be at least 2 previous non functioning accesses in her left arm.   The patient's right antecubital incision is healing well. The patient is able to complete their activities of daily living.     For VQI Use Only  PRE-ADM LIVING: Home  AMB STATUS: Ambulatory   Past  Medical History:  Diagnosis Date  . Anemia   . ARF (acute respiratory failure) ([place]) 04/11/2015   with hypoxia  . CAP (community acquired pneumonia) 2010  . Diabetic retinopathy ([place])   . ESRD on hemodialysis ([place]) 08/25/2013   Started HD in 2008.  Had [place] transplant in 2010 at [facility].  Went back on HD in 2012.  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  The kidney was left in.  She now gets HD at [facility] on TTS schedule.     [name] History of blood transfusion    "lots of times" (01/27/2013)  . 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  Both organs have since rejected, see "ESRD" for details.   [name] HTN (hypertension)    not well controlled  . On home oxygen therapy    "5L; nightttime" (12/18/2015), no longer on O2 at bedtime as of 08/18/17  . Ovarian tumor, malignant ([place]) 2005   resected in [place]' pt denies that this was cancer on 01/27/2013  . TB (pulmonary tuberculosis) 2003, 2007   history of miliary TB partially treated in 2003, and then completely treated in 2007  . Type II diabetes mellitus ([place])     Past Surgical History:  Procedure Laterality Date  . AV FISTULA PLACEMENT  06/03/2012   Procedure: INSERTION OF ARTERIOVENOUS (AV) GORE-TEX GRAFT ARM;  Surgeon: Rosetta Posner, MD;  [facility];  Service: Vascular;  Laterality: Left;  . BASCILIC VEIN TRANSPOSITION Right 03/06/2016   Procedure: RIGHT ARM BASILIC VEIN TRANSPOSITION;  Surgeon: Mal Misty, MD;  Location: [place];  Service: Vascular;  Laterality: Right;  . COMBINED KIDNEY-PANCREAS TRANSPLANT  2010   Archie Endo 01/27/2013; @ [place] Right    "cause of diabetes" (4/23/20140  . INSERTION OF DIALYSIS CATHETER Left 08/19/2017   Procedure: INSERTION OFTUNNELED  DIALYSIS CATHETER;  Surgeon: Conrad Verlot, MD;  Location: [place];  Service: Vascular;  Laterality: Left;  . PANCREATECTOMY  12/13/2010   for acute and chronic pancreatitis with  abcess formation/notes 01/27/2013  . RENAL BIOPSY  12/13/2010; 10/25/2011   Archie Endo 01/27/2013  . REVISON OF ARTERIOVENOUS FISTULA Right 08/19/2017   Procedure: LIGATion RIGHT BASILIC VEIN TRANSPOSITION;  Surgeon: Conrad Wallace Ridge, MD;  Location: [place];  Service: Vascular;  Laterality: Right;  . TEE WITHOUT CARDIOVERSION N/A 04/22/2014   Procedure: TRANSESOPHAGEAL ECHOCARDIOGRAM (TEE);  Surgeon: Fay Records, MD;  Location: [facility] ENDOSCOPY;  Service: Cardiovascular;  Laterality: N/AHinton Dyer  . TUMOR REMOVAL  2005   Ovarian, resected in [place]  . VENOGRAM Left 08/19/2017   Procedure: Left arm and Central VENOGRAM;  Surgeon: Conrad Waverly, MD;  Location: [facility] OR;  Service: Vascular;  Laterality: Left;    Social History   Socioeconomic History  . Marital status: Married    Spouse name: Not on file  . Number of children: 1  . Years of education: Not on file  . Highest education level: Not on file  Social Needs  . Financial resource strain: Not on file  . Food insecurity - worry: Not on file  . Food insecurity - inability: Not on file  . Transportation needs - medical: Not on file  . Transportation needs - non-medical: Not on file  Occupational History  . Occupation: Unemployed  Tobacco Use  . Smoking status: Never Smoker  . Smokeless tobacco: Never Used  Substance and Sexual Activity  . Alcohol use: No  . Drug use: No  . Sexual activity: No  Other Topics Concern  . Not on file  Social History Narrative   Lives with husband and son (12yrs old)..Unemployed                Allergies  Allergen Reactions  . Ace Inhibitors Cough    Current Outpatient Medications on File Prior to Visit  Medication Sig Dispense Refill  . amLODipine (NORVASC) 10 MG tablet Take 10 mg by mouth at bedtime.     [name] aspirin EC 81 MG tablet Take 81 mg by mouth at bedtime.     . B Complex-C-Folic Acid (RENA-VITE PO) Take 1 tablet by mouth at bedtime.     . calcium acetate (PHOSLO) 667 MG capsule Take 3 capsules (2,001  mg total) by mouth 3 (three) times daily with meals.    . cloNIDine (CATAPRES) 0.1 MG tablet Take 0.1 mg by mouth 2 (two) times daily.     . diphenhydrAMINE (BENADRYL) 25 mg capsule Take 25 mg by mouth daily as needed for allergies.    [name] escitalopram (LEXAPRO) 20 MG tablet Take 10 mg by mouth daily.     . insulin aspart (NOVOLOG) 100 UNIT/ML injection Inject 12 Units into the skin 2 (two) times daily with breakfast and lunch.     . insulin glargine (LANTUS) 100 UNIT/ML injection Inject 0.5 mLs (50 Units total) into the skin at bedtime. 10 mL  11  . labetalol (NORMODYNE) 300 MG tablet Take 300 mg by mouth 2 (two) times daily.    . metoCLOPramide (REGLAN) 5 MG tablet Take 5 mg by mouth 4 (four) times daily.    [name] oxyCODONE-acetaminophen (ROXICET) 5-325 MG tablet Take 1 tablet every 4 (four) hours as needed by mouth for moderate pain. 15 tablet 0  . pantoprazole (PROTONIX) 40 MG tablet Take 40 mg by mouth daily.     No current facility-administered medications on file prior to visit.       Physical Examination Vitals:   09/02/17 1114  BP: 139/70  Pulse: 81  Resp: 18  Temp: 98 F (36.7 C)  TempSrc: Oral  SpO2: 96%  Weight: 149 lb 1.6 oz (67.6 kg)   Body mass index is 23.35 kg/m.   Right antecubital incision is healing with no erythema, incision edges well proximated. Right hand with 1+ non pitting edema.  Skin feels warm and normal,right hand grip is 4/5, sensation in digits is diminished, no palpable thrill in right arm ligated access.  Radial and ulnar pulses are not palpable in either wrist, but brisk Doppler signals are present bilaterally at radial and ulnar arteries.   Medical Decision Making  [name] is a 41 y.o. year old female who presents s/p left internal jugular vein tunneled dialysis catheter placement, bilateral internal jugular vein cannulation under ultrasound guidance, left arm and central venogram, and ligation of right basilic vein transposition on 08-19-17 by Dr.  Bridgett Larsson for steal syndrome right arm.  I discussed with Dr. Donnetta Hutching pt and her husband concerns, requests, and plans. Will try to schedule pt to see Dr. Bridgett Larsson sooner than 10-24-17 to address their concerns and discuss access options: appointment moved up to 09-12-17.  The right forearm rash is improving according to pt., no dyspnea, no oral tissue swelling, no hives. The rash on the right forearm is papular, barely visible, no erythema.   Thank you for allowing [place] to participate in this patient's care.  Trevia Nop, Sharmon Leyden, RN, MSN, FNP-C [facility] [place] [facility] Office: [phone]  09/02/2017, 11:42 AM  Clinic MD: Early

## 2017-09-02 NOTE — Progress Notes (Signed)
Postoperative Access Visit   History of Present Illness  [name] is a 41 y.o. year old female who is s/p left internal jugular vein tunneled dialysis catheter placement, bilateral internal jugular vein cannulation under ultrasound guidance, left arm and central venogram, and ligation of right basilic vein transposition on 08-19-17 by Dr. Bridgett Larsson for steal syndrome right arm. She returns today, referred by Dr. Otelia Santee, for papular rash on access arm that occurred after this last surgery. Pt states she noticed this right after the ligation of the right arm access. She denies dyspnea, denies swelling of her tongue or lips. She states the right lower arm rash is pruritic, but that the rash is improving.  She and her husband also want to have another permanent access placed before she is scheduled to see Dr. Bridgett Larsson on 10-24-17, states if they cannot get a sooner appointment they will go to [place] to have an access placed.   Pt's husband expressed concern that she may be allergic to Latex or the sutures.  From 08-19-17 surgery record: subcutaneous tissue was reapproximated with a 3-0 Vicryl.  The skin was reapproximated with a running subcuticular stitch of 4-0 Monocryl.  The skin was cleaned, dried, and reinforced with Dermabond.   She dialyzes on M-W-F via left IJ tunneled dialysis catheter.   She has an 8 weeks follow up with Dr. Bridgett Larsson on 10-24-17.  Pt states her right hand looks and feels no different than before the surgery, but I am unable to ascertain if she has pain, or tingling, or numbness in her right forearm/hand, pt states "it's the same".  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   She has had what appears to be at least 2 previous non functioning accesses in her left arm.   The patient's right antecubital incision is healing well. The patient is able to complete their activities of daily living.     For VQI Use Only  PRE-ADM LIVING: Home  AMB STATUS: Ambulatory   Past  Medical History:  Diagnosis Date  . Anemia   . ARF (acute respiratory failure) ([place]) 04/11/2015   with hypoxia  . CAP (community acquired pneumonia) 2010  . Diabetic retinopathy ([place])   . ESRD on hemodialysis ([place]) 08/25/2013   Started HD in 2008.  Had [place] transplant in 2010 at [facility].  Went back on HD in 2012.  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  The kidney was left in.  She now gets HD at [facility] on TTS schedule.     [name] History of blood transfusion    "lots of times" (01/27/2013)  . 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  Both organs have since rejected, see "ESRD" for details.   [name] HTN (hypertension)    not well controlled  . On home oxygen therapy    "5L; nightttime" (12/18/2015), no longer on O2 at bedtime as of 08/18/17  . Ovarian tumor, malignant ([place]) 2005   resected in [place]' pt denies that this was cancer on 01/27/2013  . TB (pulmonary tuberculosis) 2003, 2007   history of miliary TB partially treated in 2003, and then completely treated in 2007  . Type II diabetes mellitus ([place])     Past Surgical History:  Procedure Laterality Date  . AV FISTULA PLACEMENT  06/03/2012   Procedure: INSERTION OF ARTERIOVENOUS (AV) GORE-TEX GRAFT ARM;  Surgeon: Rosetta Posner, MD;  [facility];  Service: Vascular;  Laterality: Left;  . BASCILIC VEIN TRANSPOSITION Right 03/06/2016   Procedure: RIGHT ARM BASILIC VEIN TRANSPOSITION;  Surgeon: Mal Misty, MD;  Location: [place];  Service: Vascular;  Laterality: Right;  . COMBINED KIDNEY-PANCREAS TRANSPLANT  2010   Archie Endo 01/27/2013; @ [employer] Right    "cause of diabetes" (4/23/20140  . INSERTION OF DIALYSIS CATHETER Left 08/19/2017   Procedure: INSERTION OFTUNNELED  DIALYSIS CATHETER;  Surgeon: Conrad Wilmore, MD;  Location: [place];  Service: Vascular;  Laterality: Left;  . PANCREATECTOMY  12/13/2010   for acute and chronic pancreatitis with  abcess formation/notes 01/27/2013  . RENAL BIOPSY  12/13/2010; 10/25/2011   Archie Endo 01/27/2013  . REVISON OF ARTERIOVENOUS FISTULA Right 08/19/2017   Procedure: LIGATion RIGHT BASILIC VEIN TRANSPOSITION;  Surgeon: Conrad Bristol, MD;  Location: [place];  Service: Vascular;  Laterality: Right;  . TEE WITHOUT CARDIOVERSION N/A 04/22/2014   Procedure: TRANSESOPHAGEAL ECHOCARDIOGRAM (TEE);  Surgeon: Fay Records, MD;  Location: [facility] ENDOSCOPY;  Service: Cardiovascular;  Laterality: N/AHinton Dyer  . TUMOR REMOVAL  2005   Ovarian, resected in [place]  . VENOGRAM Left 08/19/2017   Procedure: Left arm and Central VENOGRAM;  Surgeon: Conrad Aldine, MD;  Location: [facility] OR;  Service: Vascular;  Laterality: Left;    Social History   Socioeconomic History  . Marital status: Married    Spouse name: Not on file  . Number of children: 1  . Years of education: Not on file  . Highest education level: Not on file  Social Needs  . Financial resource strain: Not on file  . Food insecurity - worry: Not on file  . Food insecurity - inability: Not on file  . Transportation needs - medical: Not on file  . Transportation needs - non-medical: Not on file  Occupational History  . Occupation: Unemployed  Tobacco Use  . Smoking status: Never Smoker  . Smokeless tobacco: Never Used  Substance and Sexual Activity  . Alcohol use: No  . Drug use: No  . Sexual activity: No  Other Topics Concern  . Not on file  Social History Narrative   Lives with husband and son (49yrs old)..Unemployed                Allergies  Allergen Reactions  . Ace Inhibitors Cough    Current Outpatient Medications on File Prior to Visit  Medication Sig Dispense Refill  . amLODipine (NORVASC) 10 MG tablet Take 10 mg by mouth at bedtime.     [name] aspirin EC 81 MG tablet Take 81 mg by mouth at bedtime.     . B Complex-C-Folic Acid (RENA-VITE PO) Take 1 tablet by mouth at bedtime.     . calcium acetate (PHOSLO) 667 MG capsule Take 3 capsules (2,001  mg total) by mouth 3 (three) times daily with meals.    . cloNIDine (CATAPRES) 0.1 MG tablet Take 0.1 mg by mouth 2 (two) times daily.     . diphenhydrAMINE (BENADRYL) 25 mg capsule Take 25 mg by mouth daily as needed for allergies.    [name] escitalopram (LEXAPRO) 20 MG tablet Take 10 mg by mouth daily.     . insulin aspart (NOVOLOG) 100 UNIT/ML injection Inject 12 Units into the skin 2 (two) times daily with breakfast and lunch.     . insulin glargine (LANTUS) 100 UNIT/ML injection Inject 0.5 mLs (50 Units total) into the skin at bedtime. 10 mL  11  . labetalol (NORMODYNE) 300 MG tablet Take 300 mg by mouth 2 (two) times daily.    . metoCLOPramide (REGLAN) 5 MG tablet Take 5 mg by mouth 4 (four) times daily.    [name] oxyCODONE-acetaminophen (ROXICET) 5-325 MG tablet Take 1 tablet every 4 (four) hours as needed by mouth for moderate pain. 15 tablet 0  . pantoprazole (PROTONIX) 40 MG tablet Take 40 mg by mouth daily.     No current facility-administered medications on file prior to visit.       Physical Examination Vitals:   09/02/17 1114  BP: 139/70  Pulse: 81  Resp: 18  Temp: 98 F (36.7 C)  TempSrc: Oral  SpO2: 96%  Weight: 149 lb 1.6 oz (67.6 kg)   Body mass index is 23.35 kg/m.   Right antecubital incision is healing with no erythema, incision edges well proximated. Right hand with 1+ non pitting edema.  Skin feels warm and normal,right hand grip is 4/5, sensation in digits is diminished, no palpable thrill in right arm ligated access.  Radial and ulnar pulses are not palpable in either wrist, but brisk Doppler signals are present bilaterally at radial and ulnar arteries.   Medical Decision Making  [name] is a 41 y.o. year old female who presents s/p left internal jugular vein tunneled dialysis catheter placement, bilateral internal jugular vein cannulation under ultrasound guidance, left arm and central venogram, and ligation of right basilic vein transposition on 08-19-17 by Dr.  Bridgett Larsson for steal syndrome right arm.  I discussed with Dr. Donnetta Hutching pt and her husband concerns, requests, and plans. Will try to schedule pt to see Dr. Bridgett Larsson sooner than 10-24-17 to address their concerns and discuss access options: appointment moved up to 09-12-17.  The right forearm rash is improving according to pt., no dyspnea, no oral tissue swelling, no hives. The rash on the right forearm is papular, barely visible, no erythema.   Thank you for allowing [place] to participate in this patient's care.  Lorielle Boehning, Sharmon Leyden, RN, MSN, FNP-C [facility] [place] Office: [phone]  09/02/2017, 11:42 AM  Clinic MD: Early

## 2017-09-12 ENCOUNTER — Encounter: Payer: Self-pay | Admitting: Vascular Surgery

## 2017-09-12 ENCOUNTER — Ambulatory Visit (INDEPENDENT_AMBULATORY_CARE_PROVIDER_SITE_OTHER): Payer: Medicare Other | Admitting: Vascular Surgery

## 2017-09-12 ENCOUNTER — Encounter: Payer: Self-pay | Admitting: *Deleted

## 2017-09-12 VITALS — BP 156/86 | HR 88 | Temp 97.3°F | Resp 18 | Ht 67.0 in | Wt 149.8 lb

## 2017-09-12 DIAGNOSIS — N186 End stage renal disease: Secondary | ICD-10-CM

## 2017-09-12 DIAGNOSIS — I96 Gangrene, not elsewhere classified: Secondary | ICD-10-CM

## 2017-09-12 DIAGNOSIS — Z992 Dependence on renal dialysis: Secondary | ICD-10-CM

## 2017-09-12 NOTE — Progress Notes (Signed)
Established Access  History of Present Illness:  [name] is a 41 y.o. (July 22, 1976) female  who presents for placement of a permanent hemodialysis access.  History obtained from chart due to language barrier.   Pt was s/p R BVT by Dr. 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  The patient has a history of multiple access surgeries on the left UE. She recently underwent ligation of the R BVT and placement of a LIJV TDC.  The right hand has been seen by Hand Surgery who recommends: conservative wound care.  Right hand pain is better but she still has significant weakness.  Past Medical History:  Diagnosis Date  . Anemia   . ARF (acute respiratory failure) ([place]) 04/11/2015   with hypoxia  . CAP (community acquired pneumonia) 2010  . Diabetic retinopathy ([place])   . ESRD on hemodialysis ([place]) 08/25/2013   Started HD in 2008.  Had [place] transplant in 2010 at [facility].  Went back on HD in 2012.  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  The kidney was left in.  She now gets HD at [facility] on TTS schedule.     [name] History of blood transfusion    "lots of times" (01/27/2013)  . 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  Both organs have since rejected, see "ESRD" for details.   [name] HTN (hypertension)    not well controlled  . On home oxygen therapy    "5L; nightttime" (12/18/2015), no longer on O2 at bedtime as of 08/18/17  . Ovarian tumor, malignant ([place]) 2005   resected in [place]' pt denies that this was cancer on 01/27/2013  . TB (pulmonary tuberculosis) 2003, 2007   history of miliary TB partially treated in 2003, and then completely treated in 2007  . Type II diabetes mellitus ([place])     Past Surgical History:  Procedure Laterality Date  . AV FISTULA PLACEMENT  06/03/2012   Procedure: INSERTION OF ARTERIOVENOUS  (AV) GORE-TEX GRAFT ARM;  Surgeon: Rosetta Posner, MD;  Location: [place];  Service: Vascular;  Laterality: Left;  . BASCILIC VEIN TRANSPOSITION Right 03/06/2016   Procedure: RIGHT ARM BASILIC VEIN TRANSPOSITION;  Surgeon: Mal Misty, MD;  Location: [place];  Service: Vascular;  Laterality: Right;  . COMBINED KIDNEY-PANCREAS TRANSPLANT  2010   Archie Endo 01/27/2013; @ [place] Right    "cause of diabetes" (4/23/20140  . INSERTION OF DIALYSIS CATHETER Left 08/19/2017   Procedure: INSERTION OFTUNNELED  DIALYSIS CATHETER;  Surgeon: Conrad [place], MD;  Location: [place];  Service: Vascular;  Laterality: Left;  . PANCREATECTOMY  12/13/2010   for acute and chronic pancreatitis with abcess formation/notes 01/27/2013  . RENAL BIOPSY  12/13/2010; 10/25/2011   Archie Endo 01/27/2013  . REVISON OF ARTERIOVENOUS FISTULA Right 08/19/2017   Procedure: LIGATion RIGHT BASILIC VEIN TRANSPOSITION;  Surgeon: Conrad [place], MD;  Location: [place];  Service: Vascular;  Laterality: Right;  . TEE WITHOUT CARDIOVERSION N/A 04/22/2014   Procedure: TRANSESOPHAGEAL ECHOCARDIOGRAM (TEE);  Surgeon: Fay Records, MD;  Location: [facility] ENDOSCOPY;  Service: Cardiovascular;  Laterality: N/AHinton Dyer  . TUMOR REMOVAL  2005   Ovarian, resected in [place]  . VENOGRAM Left 08/19/2017   Procedure: Left arm and Central VENOGRAM;  Surgeon: Conrad [place], MD;  Location: [place];  Service: Vascular;  Laterality: Left;     Social History Social History   Tobacco Use  . Smoking status: Never Smoker  . Smokeless tobacco: Never Used  Substance Use Topics  . Alcohol use: No  . Drug use: No    Family History Family History  Problem Relation Age of Onset  . Diabetes Mother   . Liver cancer Father   . Cancer Father     Allergies  Allergies  Allergen Reactions  . Ace Inhibitors Cough     Current Outpatient Medications  Medication Sig Dispense Refill  . amLODipine (NORVASC) 10 MG tablet Take 10 mg by mouth at bedtime.     [name]  aspirin EC 81 MG tablet Take 81 mg by mouth at bedtime.     . B Complex-C-Folic Acid (RENA-VITE PO) Take 1 tablet by mouth at bedtime.     . calcium acetate (PHOSLO) 667 MG capsule Take 3 capsules (2,001 mg total) by mouth 3 (three) times daily with meals.    . cloNIDine (CATAPRES) 0.1 MG tablet Take 0.1 mg by mouth 2 (two) times daily.     . diphenhydrAMINE (BENADRYL) 25 mg capsule Take 25 mg by mouth daily as needed for allergies.    [name] escitalopram (LEXAPRO) 20 MG tablet Take 10 mg by mouth daily.     . insulin aspart (NOVOLOG) 100 UNIT/ML injection Inject 12 Units into the skin 2 (two) times daily with breakfast and lunch.     . insulin glargine (LANTUS) 100 UNIT/ML injection Inject 0.5 mLs (50 Units total) into the skin at bedtime. 10 mL 11  . labetalol (NORMODYNE) 300 MG tablet Take 300 mg by mouth 2 (two) times daily.    . metoCLOPramide (REGLAN) 5 MG tablet Take 5 mg by mouth 4 (four) times daily.    [name] oxyCODONE-acetaminophen (ROXICET) 5-325 MG tablet Take 1 tablet every 4 (four) hours as needed by mouth for moderate pain. 15 tablet 0  . pantoprazole (PROTONIX) 40 MG tablet Take 40 mg by mouth daily.     No current facility-administered medications for this visit.     ROS:   General:  No weight loss, Fever, chills  HEENT: No recent headaches, no nasal bleeding, no visual changes, no sore throat  Neurologic: No dizziness, blackouts, seizures. No recent symptoms of stroke or mini- stroke. No recent episodes of slurred speech, or temporary blindness.  Cardiac: No recent episodes of chest pain/pressure, no shortness of breath at rest.  No shortness of breath with exertion.  Denies history of atrial fibrillation or irregular heartbeat  Vascular: No history of rest pain in feet.  No history of claudication.  No history of non-healing ulcer, No history of DVT   Pulmonary: No home oxygen, no productive cough, no hemoptysis,  No asthma or wheezing  Musculoskeletal:  [ ]  Arthritis, [ ]  Low  back pain,  [ ]  Joint pain  Hematologic:No history of hypercoagulable state.  No history of easy bleeding.  No history of anemia  Gastrointestinal: No hematochezia or melena,  No gastroesophageal reflux, no trouble swallowing  Urinary: [ ]  chronic Kidney disease, [x ] on HD - [ ]  MWF or [ ]  TTHS, [ ]  Burning with urination, [ ]  Frequent urination, [ ]  Difficulty urinating;   Skin:ness.  Psychological: No history of anxiety,  No history of depression   Physical Examination  Vitals:   09/12/17 1419  BP: (!) 156/86  Pulse: 88  Resp: 18  Temp: (!) 97.3 F (36.3 C)  TempSrc: Oral  SpO2: 100%  Weight: 149 lb 12.8 oz (67.9 kg)  Height: 5\' 7"  (1.702 m)    Body mass index is 23.46 kg/m.  General:  Alert and oriented, no acute distress HEENT: Normal Neck: No bruit or JVD Pulmonary: Clear to auscultation bilaterally Cardiac: Regular Rate and Rhythm without murmur Gastrointestinal: Soft, non-tender, non-distended, no mass, no scars Skin:  finger tips on right hand wounds, edema right UE and weak Extremity Pulses:  brachial pulses bilaterally Musculoskeletal: right UE edema, R hand: dry gangrene in distal phalange, small areas of healing ulcers in R 3rd and 4th fingers, multiple incisions in L arm Neurologic: RUE: grip 4/5, LUE: grip 5/5, Bilateral  lower extremity motor 5/5 and symmetric  Assessment: end-stage renal failure, diabetic peripheral arterial disease likely leading to R hand gangrene in the setting of R arm access  PLAN: We will plan left UE av graft verses hybrid graft 09/16/2017.   Her right thumb pad has 40 % dry gangrene and the nail bases of 3rd and 4th finger are healing fairly well.  We will refer her to Dr. Amedeo Plenty for evaluation and treatment.    Roxy Horseman PA-C [facility]  The patient was seen in conjunction with Dr. Bridgett Larsson today  Addendum  I have independently interviewed and examined the patient, and I agree  with the physician assistant's findings.  R hand looks better since the ligation.  I had a long conversation with the patient in regards to the L arm access.  I would prefer to let the right hand fully recover but the patient is fixated on the infectious risk of the [facility].  I made it clear to the patient that steal syndrome involving the left arm might be develop, as this is more common when the contralateral limb has had steal previously.  This potentially could result in two poorly function hands, which was severely reduce her functional status.   Pt elects to proceed with LUA AVG vs hybrid AVG placement.  Risk, benefits, and alternatives to access surgery were discussed.    The patient is aware the risks include but are not limited to: bleeding, infection, steal syndrome, nerve damage, ischemic monomelic neuropathy, thrombosis, failure to mature, complications related to venous hypertension, need for additional procedures, death and stroke.    The patient agrees to proceed forward with the procedure.  I do not agree with the management plan of this patient dominant right hand.  Will have Dr. Amedeo Plenty give a second opinion.   Adele Barthel, MD, FACS [facility] [place] Office: [phone] Pager: [phone]  09/12/2017, 4:38 PM

## 2017-09-12 NOTE — H&P (View-Only) (Signed)
Established Access  History of Present Illness:  [name] is a 41 y.o. (1976-06-23) female  who presents for placement of a permanent hemodialysis access.  History obtained from chart due to language barrier.   Pt was s/p R BVT by Dr. 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  The patient has a history of multiple access surgeries on the left UE. She recently underwent ligation of the R BVT and placement of a LIJV TDC.  The right hand has been seen by Hand Surgery who recommends: conservative wound care.  Right hand pain is better but she still has significant weakness.  Past Medical History:  Diagnosis Date  . Anemia   . ARF (acute respiratory failure) ([place]) 04/11/2015   with hypoxia  . CAP (community acquired pneumonia) 2010  . Diabetic retinopathy ([place])   . ESRD on hemodialysis ([place]) 08/25/2013   Started HD in 2008.  Had [place] transplant in 2010 at [facility].  Went back on HD in 2012.  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  The kidney was left in.  She now gets HD at [facility] on TTS schedule.     [name] History of blood transfusion    "lots of times" (01/27/2013)  . 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  Both organs have since rejected, see "ESRD" for details.   [name] HTN (hypertension)    not well controlled  . On home oxygen therapy    "5L; nightttime" (12/18/2015), no longer on O2 at bedtime as of 08/18/17  . Ovarian tumor, malignant ([place]) 2005   resected in [place]' pt denies that this was cancer on 01/27/2013  . TB (pulmonary tuberculosis) 2003, 2007   history of miliary TB partially treated in 2003, and then completely treated in 2007  . Type II diabetes mellitus ([place])     Past Surgical History:  Procedure Laterality Date  . AV FISTULA PLACEMENT  06/03/2012   Procedure: INSERTION OF ARTERIOVENOUS  (AV) GORE-TEX GRAFT ARM;  Surgeon: Rosetta Posner, MD;  Location: [place];  Service: Vascular;  Laterality: Left;  . BASCILIC VEIN TRANSPOSITION Right 03/06/2016   Procedure: RIGHT ARM BASILIC VEIN TRANSPOSITION;  Surgeon: Mal Misty, MD;  Location: [place];  Service: Vascular;  Laterality: Right;  . COMBINED KIDNEY-PANCREAS TRANSPLANT  2010   Archie Endo 01/27/2013; @ [place] Right    "cause of diabetes" (4/23/20140  . INSERTION OF DIALYSIS CATHETER Left 08/19/2017   Procedure: INSERTION OFTUNNELED  DIALYSIS CATHETER;  Surgeon: Conrad [place], MD;  Location: [place];  Service: Vascular;  Laterality: Left;  . PANCREATECTOMY  12/13/2010   for acute and chronic pancreatitis with abcess formation/notes 01/27/2013  . RENAL BIOPSY  12/13/2010; 10/25/2011   Archie Endo 01/27/2013  . REVISON OF ARTERIOVENOUS FISTULA Right 08/19/2017   Procedure: LIGATion RIGHT BASILIC VEIN TRANSPOSITION;  Surgeon: Conrad [place], MD;  Location: [place];  Service: Vascular;  Laterality: Right;  . TEE WITHOUT CARDIOVERSION N/A 04/22/2014   Procedure: TRANSESOPHAGEAL ECHOCARDIOGRAM (TEE);  Surgeon: Fay Records, MD;  Location: [facility] ENDOSCOPY;  Service: Cardiovascular;  Laterality: N/AHinton Dyer  . TUMOR REMOVAL  2005   Ovarian, resected in [place]  . VENOGRAM Left 08/19/2017   Procedure: Left arm and Central VENOGRAM;  Surgeon: Conrad [place], MD;  Location: [place];  Service: Vascular;  Laterality: Left;     Social History Social History   Tobacco Use  . Smoking status: Never Smoker  . Smokeless tobacco: Never Used  Substance Use Topics  . Alcohol use: No  . Drug use: No    Family History Family History  Problem Relation Age of Onset  . Diabetes Mother   . Liver cancer Father   . Cancer Father     Allergies  Allergies  Allergen Reactions  . Ace Inhibitors Cough     Current Outpatient Medications  Medication Sig Dispense Refill  . amLODipine (NORVASC) 10 MG tablet Take 10 mg by mouth at bedtime.     [name]  aspirin EC 81 MG tablet Take 81 mg by mouth at bedtime.     . B Complex-C-Folic Acid (RENA-VITE PO) Take 1 tablet by mouth at bedtime.     . calcium acetate (PHOSLO) 667 MG capsule Take 3 capsules (2,001 mg total) by mouth 3 (three) times daily with meals.    . cloNIDine (CATAPRES) 0.1 MG tablet Take 0.1 mg by mouth 2 (two) times daily.     . diphenhydrAMINE (BENADRYL) 25 mg capsule Take 25 mg by mouth daily as needed for allergies.    [name] escitalopram (LEXAPRO) 20 MG tablet Take 10 mg by mouth daily.     . insulin aspart (NOVOLOG) 100 UNIT/ML injection Inject 12 Units into the skin 2 (two) times daily with breakfast and lunch.     . insulin glargine (LANTUS) 100 UNIT/ML injection Inject 0.5 mLs (50 Units total) into the skin at bedtime. 10 mL 11  . labetalol (NORMODYNE) 300 MG tablet Take 300 mg by mouth 2 (two) times daily.    . metoCLOPramide (REGLAN) 5 MG tablet Take 5 mg by mouth 4 (four) times daily.    [name] oxyCODONE-acetaminophen (ROXICET) 5-325 MG tablet Take 1 tablet every 4 (four) hours as needed by mouth for moderate pain. 15 tablet 0  . pantoprazole (PROTONIX) 40 MG tablet Take 40 mg by mouth daily.     No current facility-administered medications for this visit.     ROS:   General:  No weight loss, Fever, chills  HEENT: No recent headaches, no nasal bleeding, no visual changes, no sore throat  Neurologic: No dizziness, blackouts, seizures. No recent symptoms of stroke or mini- stroke. No recent episodes of slurred speech, or temporary blindness.  Cardiac: No recent episodes of chest pain/pressure, no shortness of breath at rest.  No shortness of breath with exertion.  Denies history of atrial fibrillation or irregular heartbeat  Vascular: No history of rest pain in feet.  No history of claudication.  No history of non-healing ulcer, No history of DVT   Pulmonary: No home oxygen, no productive cough, no hemoptysis,  No asthma or wheezing  Musculoskeletal:  [ ]  Arthritis, [ ]  Low  back pain,  [ ]  Joint pain  Hematologic:No history of hypercoagulable state.  No history of easy bleeding.  No history of anemia  Gastrointestinal: No hematochezia or melena,  No gastroesophageal reflux, no trouble swallowing  Urinary: [ ]  chronic Kidney disease, [x ] on HD - [ ]  MWF or [ ]  TTHS, [ ]  Burning with urination, [ ]  Frequent urination, [ ]  Difficulty urinating;   Skin:ness.  Psychological: No history of anxiety,  No history of depression   Physical Examination  Vitals:   09/12/17 1419  BP: (!) 156/86  Pulse: 88  Resp: 18  Temp: (!) 97.3 F (36.3 C)  TempSrc: Oral  SpO2: 100%  Weight: 149 lb 12.8 oz (67.9 kg)  Height: 5\' 7"  (1.702 m)    Body mass index is 23.46 kg/m.  General:  Alert and oriented, no acute distress HEENT: Normal Neck: No bruit or JVD Pulmonary: Clear to auscultation bilaterally Cardiac: Regular Rate and Rhythm without murmur Gastrointestinal: Soft, non-tender, non-distended, no mass, no scars Skin:  finger tips on right hand wounds, edema right UE and weak Extremity Pulses:  brachial pulses bilaterally Musculoskeletal: right UE edema, R hand: dry gangrene in distal phalange, small areas of healing ulcers in R 3rd and 4th fingers, multiple incisions in L arm Neurologic: RUE: grip 4/5, LUE: grip 5/5, Bilateral  lower extremity motor 5/5 and symmetric  Assessment: end-stage renal failure, diabetic peripheral arterial disease likely leading to R hand gangrene in the setting of R arm access  PLAN: We will plan left UE av graft verses hybrid graft 09/16/2017.   Her right thumb pad has 40 % dry gangrene and the nail bases of 3rd and 4th finger are healing fairly well.  We will refer her to Dr. Amedeo Plenty for evaluation and treatment.    Roxy Horseman PA-C [facility]  The patient was seen in conjunction with Dr. Bridgett Larsson today  Addendum  I have independently interviewed and examined the patient, and I agree  with the physician assistant's findings.  R hand looks better since the ligation.  I had a long conversation with the patient in regards to the L arm access.  I would prefer to let the right hand fully recover but the patient is fixated on the infectious risk of the [facility].  I made it clear to the patient that steal syndrome involving the left arm might be develop, as this is more common when the contralateral limb has had steal previously.  This potentially could result in two poorly function hands, which was severely reduce her functional status.   Pt elects to proceed with LUA AVG vs hybrid AVG placement.  Risk, benefits, and alternatives to access surgery were discussed.    The patient is aware the risks include but are not limited to: bleeding, infection, steal syndrome, nerve damage, ischemic monomelic neuropathy, thrombosis, failure to mature, complications related to venous hypertension, need for additional procedures, death and stroke.    The patient agrees to proceed forward with the procedure.  I do not agree with the management plan of this patient dominant right hand.  Will have Dr. Amedeo Plenty give a second opinion.   Adele Barthel, MD, FACS [facility] [place] Office: [phone] Pager: [phone]  09/12/2017, 4:38 PM

## 2017-09-16 ENCOUNTER — Other Ambulatory Visit: Payer: Self-pay | Admitting: *Deleted

## 2017-09-16 ENCOUNTER — Telehealth: Payer: Self-pay | Admitting: *Deleted

## 2017-09-16 NOTE — Telephone Encounter (Signed)
Call to [name] with Hybrid Graft no one available for this date for surgery but states there are grafts available at the hospital for this case. Text message to Dr. Bridgett Larsson regarding this.

## 2017-09-17 ENCOUNTER — Other Ambulatory Visit: Payer: Self-pay

## 2017-09-17 ENCOUNTER — Encounter (HOSPITAL_COMMUNITY): Payer: Self-pay | Admitting: *Deleted

## 2017-09-17 NOTE — Progress Notes (Signed)
Pt denies SOB, chest pain, and being under the care of a cardiologist. Pt denies having having a stress test and cardiac cath. Pt denies having a chest x ray within the last year. Pt stated that she was instructed to take half of her Lantus insulin (16 units, dose changed per pt ) tonight and no insulin the morning of surgery by MD. Pt made aware to stop taking vitamins, fish oil and herbal medications. Pt made aware to check BG every 2 hours prior to arrival to hospital on DOS. Pt made aware to treat a BG < 70 with 4 glucose tabs or glucose gel or 4 ounces of apple or cranberry juice, wait 15 minutes after intervention to recheck BG, if BG remains < 70, call Short Stay unit to speak with a nurse. Pt verbalized understanding of all pre-op instructions.

## 2017-09-18 ENCOUNTER — Ambulatory Visit (HOSPITAL_COMMUNITY)
Admission: RE | Admit: 2017-09-18 | Discharge: 2017-09-18 | Disposition: A | Discharge status: Home / Self Care | Payer: Medicare Other | Source: Ambulatory Visit | Attending: Vascular Surgery | Admitting: Vascular Surgery

## 2017-09-18 ENCOUNTER — Encounter (HOSPITAL_COMMUNITY): Payer: Self-pay | Admitting: *Deleted

## 2017-09-18 ENCOUNTER — Ambulatory Visit (HOSPITAL_COMMUNITY): Payer: Medicare Other | Admitting: Anesthesiology

## 2017-09-18 ENCOUNTER — Encounter (HOSPITAL_COMMUNITY)
Admission: RE | Disposition: A | Discharge status: Home / Self Care | Payer: Self-pay | Source: Ambulatory Visit | Attending: Vascular Surgery

## 2017-09-18 DIAGNOSIS — E1152 Type 2 diabetes mellitus with diabetic peripheral angiopathy with gangrene: Secondary | ICD-10-CM | POA: Diagnosis not present

## 2017-09-18 DIAGNOSIS — Z794 Long term (current) use of insulin: Secondary | ICD-10-CM | POA: Insufficient documentation

## 2017-09-18 DIAGNOSIS — I96 Gangrene, not elsewhere classified: Secondary | ICD-10-CM | POA: Diagnosis not present

## 2017-09-18 DIAGNOSIS — Z888 Allergy status to other drugs, medicaments and biological substances status: Secondary | ICD-10-CM | POA: Diagnosis not present

## 2017-09-18 DIAGNOSIS — I120 Hypertensive chronic kidney disease with stage 5 chronic kidney disease or end stage renal disease: Secondary | ICD-10-CM | POA: Diagnosis not present

## 2017-09-18 DIAGNOSIS — N186 End stage renal disease: Secondary | ICD-10-CM | POA: Diagnosis not present

## 2017-09-18 DIAGNOSIS — E11319 Type 2 diabetes mellitus with unspecified diabetic retinopathy without macular edema: Secondary | ICD-10-CM | POA: Insufficient documentation

## 2017-09-18 DIAGNOSIS — Z940 Kidney transplant status: Secondary | ICD-10-CM | POA: Insufficient documentation

## 2017-09-18 DIAGNOSIS — Z9889 Other specified postprocedural states: Secondary | ICD-10-CM | POA: Insufficient documentation

## 2017-09-18 DIAGNOSIS — T82898A Other specified complication of vascular prosthetic devices, implants and grafts, initial encounter: Secondary | ICD-10-CM | POA: Insufficient documentation

## 2017-09-18 DIAGNOSIS — Z7982 Long term (current) use of aspirin: Secondary | ICD-10-CM | POA: Insufficient documentation

## 2017-09-18 DIAGNOSIS — Z9981 Dependence on supplemental oxygen: Secondary | ICD-10-CM | POA: Diagnosis not present

## 2017-09-18 DIAGNOSIS — E1122 Type 2 diabetes mellitus with diabetic chronic kidney disease: Secondary | ICD-10-CM | POA: Insufficient documentation

## 2017-09-18 DIAGNOSIS — X58XXXA Exposure to other specified factors, initial encounter: Secondary | ICD-10-CM | POA: Diagnosis not present

## 2017-09-18 DIAGNOSIS — Z79899 Other long term (current) drug therapy: Secondary | ICD-10-CM | POA: Insufficient documentation

## 2017-09-18 DIAGNOSIS — Z992 Dependence on renal dialysis: Secondary | ICD-10-CM | POA: Insufficient documentation

## 2017-09-18 DIAGNOSIS — Z8543 Personal history of malignant neoplasm of ovary: Secondary | ICD-10-CM | POA: Diagnosis not present

## 2017-09-18 DIAGNOSIS — Z9483 Pancreas transplant status: Secondary | ICD-10-CM | POA: Insufficient documentation

## 2017-09-18 DIAGNOSIS — Z8611 Personal history of tuberculosis: Secondary | ICD-10-CM | POA: Diagnosis not present

## 2017-09-18 HISTORY — PX: AV FISTULA PLACEMENT: SHX1204

## 2017-09-18 LAB — POCT I-STAT 4, (NA,K, GLUC, HGB,HCT)
GLUCOSE: 95 mg/dL (ref 65–99)
HCT: 37 % (ref 36.0–46.0)
Hemoglobin: 12.6 g/dL (ref 12.0–15.0)
Potassium: 5.8 mmol/L — ABNORMAL HIGH (ref 3.5–5.1)
Sodium: 138 mmol/L (ref 135–145)

## 2017-09-18 LAB — GLUCOSE, CAPILLARY
GLUCOSE-CAPILLARY: 91 mg/dL (ref 65–99)
GLUCOSE-CAPILLARY: 81 mg/dL (ref 65–99)

## 2017-09-18 LAB — HCG, SERUM, QUALITATIVE: Preg, Serum: NEGATIVE

## 2017-09-18 SURGERY — INSERTION OF ARTERIOVENOUS (AV) GORE-TEX GRAFT ARM
Anesthesia: General | Laterality: Left

## 2017-09-18 MED ORDER — MIDAZOLAM HCL 5 MG/5ML IJ SOLN
INTRAMUSCULAR | Status: DC | PRN
  Administered 2017-09-18: 2 mg via INTRAVENOUS

## 2017-09-18 MED ORDER — SODIUM CHLORIDE 0.9 % IV SOLN
INTRAVENOUS | Status: DC
  Administered 2017-09-18: 10 mL/h via INTRAVENOUS
  Administered 2017-09-18: 14:00:00 via INTRAVENOUS

## 2017-09-18 MED ORDER — ONDANSETRON HCL 4 MG/2ML IJ SOLN
INTRAMUSCULAR | Status: DC | PRN
  Administered 2017-09-18: 4 mg via INTRAVENOUS

## 2017-09-18 MED ORDER — CEFUROXIME SODIUM 1.5 G IV SOLR
INTRAVENOUS | Status: AC
  Filled 2017-09-18: qty 1.5

## 2017-09-18 MED ORDER — LIDOCAINE 2% (20 MG/ML) 5 ML SYRINGE
INTRAMUSCULAR | Status: AC
  Filled 2017-09-18: qty 5

## 2017-09-18 MED ORDER — PROPOFOL 1000 MG/100ML IV EMUL
INTRAVENOUS | Status: AC
  Filled 2017-09-18: qty 100

## 2017-09-18 MED ORDER — PROTAMINE SULFATE 10 MG/ML IV SOLN
INTRAVENOUS | Status: AC
  Filled 2017-09-18: qty 5

## 2017-09-18 MED ORDER — PROMETHAZINE HCL 25 MG/ML IJ SOLN: 6.2500 mg | INTRAMUSCULAR | Status: DC | PRN

## 2017-09-18 MED ORDER — LIDOCAINE HCL (CARDIAC) 20 MG/ML IV SOLN
INTRAVENOUS | Status: DC | PRN
  Administered 2017-09-18: 100 mg via INTRATRACHEAL

## 2017-09-18 MED ORDER — 0.9 % SODIUM CHLORIDE (POUR BTL) OPTIME
TOPICAL | Status: DC | PRN
  Administered 2017-09-18: 1000 mL

## 2017-09-18 MED ORDER — PROPOFOL 10 MG/ML IV BOLUS
INTRAVENOUS | Status: AC
  Filled 2017-09-18: qty 20

## 2017-09-18 MED ORDER — FENTANYL CITRATE (PF) 250 MCG/5ML IJ SOLN
INTRAMUSCULAR | Status: DC | PRN
  Administered 2017-09-18 (×2): 50 ug via INTRAVENOUS
  Administered 2017-09-18: 150 ug via INTRAVENOUS

## 2017-09-18 MED ORDER — CHLORHEXIDINE GLUCONATE 4 % EX LIQD
60.0000 mL | Freq: Once | CUTANEOUS | Status: DC
Start: 2017-09-19 — End: 2017-09-18

## 2017-09-18 MED ORDER — HEMOSTATIC AGENTS (NO CHARGE) OPTIME
TOPICAL | Status: DC | PRN
  Administered 2017-09-18: 1 via TOPICAL

## 2017-09-18 MED ORDER — FENTANYL CITRATE (PF) 250 MCG/5ML IJ SOLN
INTRAMUSCULAR | Status: AC
  Filled 2017-09-18: qty 5

## 2017-09-18 MED ORDER — HEPARIN SODIUM (PORCINE) 1000 UNIT/ML IJ SOLN
INTRAMUSCULAR | Status: AC
  Filled 2017-09-18: qty 1

## 2017-09-18 MED ORDER — PROTAMINE SULFATE 10 MG/ML IV SOLN
INTRAVENOUS | Status: DC | PRN
  Administered 2017-09-18 (×2): 10 mg via INTRAVENOUS
  Administered 2017-09-18: 20 mg via INTRAVENOUS

## 2017-09-18 MED ORDER — MIDAZOLAM HCL 2 MG/2ML IJ SOLN
INTRAMUSCULAR | Status: AC
  Filled 2017-09-18: qty 2

## 2017-09-18 MED ORDER — DEXTROSE 5 % IV SOLN
1.5000 g | INTRAVENOUS | Status: AC
  Administered 2017-09-18: 1.5 g via INTRAVENOUS

## 2017-09-18 MED ORDER — PROPOFOL 10 MG/ML IV BOLUS
INTRAVENOUS | Status: DC | PRN
  Administered 2017-09-18 (×2): 20 mg via INTRAVENOUS
  Administered 2017-09-18: 130 mg via INTRAVENOUS

## 2017-09-18 MED ORDER — HEPARIN SODIUM (PORCINE) 1000 UNIT/ML IJ SOLN
INTRAMUSCULAR | Status: DC | PRN
  Administered 2017-09-18: 7000 [IU] via INTRAVENOUS

## 2017-09-18 MED ORDER — ONDANSETRON HCL 4 MG/2ML IJ SOLN
INTRAMUSCULAR | Status: AC
  Filled 2017-09-18: qty 2

## 2017-09-18 MED ORDER — MIDAZOLAM HCL 2 MG/2ML IJ SOLN: 1.0000 mg | INTRAMUSCULAR | Status: DC | PRN

## 2017-09-18 MED ORDER — OXYCODONE-ACETAMINOPHEN 5-325 MG PO TABS
ORAL_TABLET | ORAL | Status: AC
  Administered 2017-09-18: 2 via ORAL
  Filled 2017-09-18: qty 2

## 2017-09-18 MED ORDER — HEPARIN SODIUM (PORCINE) 5000 UNIT/ML IJ SOLN
INTRAMUSCULAR | Status: DC | PRN
  Administered 2017-09-18: 500 mL

## 2017-09-18 MED ORDER — HYDROMORPHONE HCL 1 MG/ML IJ SOLN
INTRAMUSCULAR | Status: AC
  Administered 2017-09-18: 0.5 mg via INTRAVENOUS
  Filled 2017-09-18: qty 1

## 2017-09-18 MED ORDER — LIDOCAINE HCL (PF) 1 % IJ SOLN
INTRAMUSCULAR | Status: AC
  Filled 2017-09-18: qty 30

## 2017-09-18 MED ORDER — CHLORHEXIDINE GLUCONATE 4 % EX LIQD: 60.0000 mL | Freq: Once | CUTANEOUS | Status: DC

## 2017-09-18 MED ORDER — HYDROMORPHONE HCL 1 MG/ML IJ SOLN
INTRAMUSCULAR | Status: AC
Start: 2017-09-18 — End: 2017-09-18
  Administered 2017-09-18: 0.5 mg via INTRAVENOUS
  Filled 2017-09-18: qty 1

## 2017-09-18 MED ORDER — FENTANYL CITRATE (PF) 100 MCG/2ML IJ SOLN: 50.0000 ug | INTRAMUSCULAR | Status: DC | PRN

## 2017-09-18 MED ORDER — OXYCODONE-ACETAMINOPHEN 5-325 MG PO TABS: 1.0000 | ORAL_TABLET | ORAL | 0 refills | Status: AC | PRN

## 2017-09-18 MED ORDER — OXYCODONE-ACETAMINOPHEN 5-325 MG PO TABS
1.0000 | ORAL_TABLET | Freq: Once | ORAL | Status: AC | PRN
  Administered 2017-09-18: 2 via ORAL

## 2017-09-18 MED ORDER — HYDROMORPHONE HCL 1 MG/ML IJ SOLN
0.2500 mg | INTRAMUSCULAR | Status: DC | PRN
  Administered 2017-09-18 (×4): 0.5 mg via INTRAVENOUS

## 2017-09-18 SURGICAL SUPPLY — 47 items
ARMBAND PINK RESTRICT EXTREMIT (MISCELLANEOUS) ×6 IMPLANT
BENZOIN TINCTURE PRP APPL 2/3 (GAUZE/BANDAGES/DRESSINGS) ×3 IMPLANT
CANISTER SUCT 3000ML PPV (MISCELLANEOUS) ×3 IMPLANT
CATH EMB 4FR 40CM (CATHETERS) ×3 IMPLANT
CLIP VESOCCLUDE MED 6/CT (CLIP) ×3 IMPLANT
CLIP VESOCCLUDE SM WIDE 6/CT (CLIP) ×3 IMPLANT
CLOSURE WOUND 1/2 X4 (GAUZE/BANDAGES/DRESSINGS) ×1
COVER PROBE W GEL 5X96 (DRAPES) ×3 IMPLANT
DECANTER SPIKE VIAL GLASS SM (MISCELLANEOUS) ×3 IMPLANT
DERMABOND ADVANCED (GAUZE/BANDAGES/DRESSINGS) ×2
DERMABOND ADVANCED .7 DNX12 (GAUZE/BANDAGES/DRESSINGS) ×1 IMPLANT
DRSG TEGADERM 2-3/8X2-3/4 SM (GAUZE/BANDAGES/DRESSINGS) ×3 IMPLANT
DRSG TEGADERM 4X4.75 (GAUZE/BANDAGES/DRESSINGS) ×3 IMPLANT
ELECT CAUTERY BLADE 6.4 (BLADE) ×3 IMPLANT
ELECT REM PT RETURN 9FT ADLT (ELECTROSURGICAL) ×3
ELECTRODE REM PT RTRN 9FT ADLT (ELECTROSURGICAL) ×1 IMPLANT
GAUZE SPONGE 4X4 12PLY STRL LF (GAUZE/BANDAGES/DRESSINGS) ×3 IMPLANT
GLOVE BIO SURGEON STRL SZ 6.5 (GLOVE) ×2 IMPLANT
GLOVE BIO SURGEON STRL SZ7 (GLOVE) ×3 IMPLANT
GLOVE BIO SURGEONS STRL SZ 6.5 (GLOVE) ×1
GLOVE BIOGEL PI IND STRL 6.5 (GLOVE) ×2 IMPLANT
GLOVE BIOGEL PI IND STRL 7.5 (GLOVE) ×3 IMPLANT
GLOVE BIOGEL PI INDICATOR 6.5 (GLOVE) ×4
GLOVE BIOGEL PI INDICATOR 7.5 (GLOVE) ×6
GLOVE ECLIPSE 6.0 STRL STRAW (GLOVE) ×3 IMPLANT
GLOVE ECLIPSE 7.0 STRL STRAW (GLOVE) ×3 IMPLANT
GOWN STRL REUS W/ TWL LRG LVL3 (GOWN DISPOSABLE) ×5 IMPLANT
GOWN STRL REUS W/TWL LRG LVL3 (GOWN DISPOSABLE) ×10
GRAFT GORETEX STRT 4-7X45 (Vascular Products) ×3 IMPLANT
HEMOSTAT SPONGE AVITENE ULTRA (HEMOSTASIS) ×3 IMPLANT
KIT BASIN OR (CUSTOM PROCEDURE TRAY) ×3 IMPLANT
KIT ROOM TURNOVER OR (KITS) ×3 IMPLANT
NS IRRIG 1000ML POUR BTL (IV SOLUTION) ×3 IMPLANT
PACK CV ACCESS (CUSTOM PROCEDURE TRAY) ×3 IMPLANT
PAD ARMBOARD 7.5X6 YLW CONV (MISCELLANEOUS) ×6 IMPLANT
STRIP CLOSURE SKIN 1/2X4 (GAUZE/BANDAGES/DRESSINGS) ×2 IMPLANT
SUT GORETEX 6.0 TT13 (SUTURE) IMPLANT
SUT MNCRL AB 4-0 PS2 18 (SUTURE) ×6 IMPLANT
SUT PROLENE 6 0 BV (SUTURE) ×6 IMPLANT
SUT PROLENE 7 0 BV 1 (SUTURE) IMPLANT
SUT SILK 2 0 PERMA HAND 18 BK (SUTURE) IMPLANT
SUT VIC AB 3-0 SH 27 (SUTURE) ×4
SUT VIC AB 3-0 SH 27X BRD (SUTURE) ×2 IMPLANT
SYR TOOMEY 50ML (SYRINGE) IMPLANT
TOWEL GREEN STERILE (TOWEL DISPOSABLE) ×3 IMPLANT
UNDERPAD 30X30 (UNDERPADS AND DIAPERS) ×3 IMPLANT
WATER STERILE IRR 1000ML POUR (IV SOLUTION) ×3 IMPLANT

## 2017-09-18 NOTE — Anesthesia Preprocedure Evaluation (Addendum)
Anesthesia Evaluation  Patient identified by MRN, date of birth, ID band Patient awake    Reviewed: Allergy & Precautions, NPO status , Patient's Chart, lab work & pertinent test results, reviewed documented beta blocker date and time   History of Anesthesia Complications Negative for: history of anesthetic complications  Airway Mallampati: III  TM Distance: >3 FB Neck ROM: Full    Dental  (+) Dental Advisory Given, Teeth Intact, Chipped   Pulmonary neg pulmonary ROS,  Hx TB s/p tx 2007   Pulmonary exam normal        Cardiovascular hypertension, Pt. on home beta blockers and Pt. on medications (-) Past MI negative cardio ROS Normal cardiovascular exam     Neuro/Psych negative neurological ROS  negative psych ROS   GI/Hepatic negative GI ROS, Neg liver ROS,   Endo/Other  diabetes, Well Controlled, Type 2, Insulin Dependent  Renal/GU ESRFRenal diseaseS/p renal transplant (rejection)  negative genitourinary   Musculoskeletal negative musculoskeletal ROS (+)   Abdominal   Peds  Hematology  (+) anemia ,   Anesthesia Other Findings   Reproductive/Obstetrics                                                            Anesthesia Evaluation  Patient identified by MRN, date of birth, ID band Patient awake    Reviewed: Allergy & Precautions, NPO status , Patient's Chart, lab work & pertinent test results, reviewed documented beta blocker date and time   Airway Mallampati: III  TM Distance: >3 FB Neck ROM: Full    Dental  (+) Dental Advisory Given, Teeth Intact, Chipped   Pulmonary  Hx TB s/p tx 2007   Pulmonary exam normal breath sounds clear to auscultation       Cardiovascular hypertension, Pt. on home beta blockers and Pt. on medications (-) Past MI negative cardio ROS Normal cardiovascular exam Rhythm:Regular Rate:Normal     Neuro/Psych negative neurological ROS   negative psych ROS   GI/Hepatic negative GI ROS, Neg liver ROS,   Endo/Other  diabetes, Well Controlled, Type 2, Insulin Dependent  Renal/GU ESRFRenal diseaseS/p renal transplant (rejection)  negative genitourinary   Musculoskeletal negative musculoskeletal ROS (+)   Abdominal   Peds  Hematology  (+) anemia ,   Anesthesia Other Findings   Reproductive/Obstetrics                             Anesthesia Physical Anesthesia Plan  ASA: III  Anesthesia Plan: MAC   Post-op Pain Management:    Induction: Intravenous  PONV Risk Score and Plan: Propofol infusion and Treatment may vary due to age or medical condition  Airway Management Planned: Nasal Cannula  Additional Equipment: None  Intra-op Plan:   Post-operative Plan:   Informed Consent: I have reviewed the patients History and Physical, chart, labs and discussed the procedure including the risks, benefits and alternatives for the proposed anesthesia with the patient or authorized representative who has indicated his/her understanding and acceptance.   Dental advisory given  Plan Discussed with: CRNA  Anesthesia Plan Comments:         Anesthesia Quick Evaluation  Anesthesia Physical  Anesthesia Plan  ASA: III  Anesthesia Plan: General   Post-op Pain Management:    Induction:  Intravenous  PONV Risk Score and Plan: 2 and Propofol infusion, Treatment may vary due to age or medical condition and Ondansetron  Airway Management Planned: LMA  Additional Equipment: None  Intra-op Plan:   Post-operative Plan: Extubation in OR  Informed Consent: I have reviewed the patients History and Physical, chart, labs and discussed the procedure including the risks, benefits and alternatives for the proposed anesthesia with the patient or authorized representative who has indicated his/her understanding and acceptance.   Dental advisory given  Plan Discussed with: CRNA,  Anesthesiologist and Surgeon  Anesthesia Plan Comments:       Anesthesia Quick Evaluation

## 2017-09-18 NOTE — Interval H&P Note (Signed)
History and Physical Interval Note:  09/18/2017 12:32 PM  [name]  has presented today for surgery, with the diagnosis of END STAGE RENAL DISEASE  The various methods of treatment have been discussed with the patient and family. After consideration of risks, benefits and other options for treatment, the patient has consented to  Procedure(s): INSERTION OF ARTERIOVENOUS (AV) GORE-TEX GRAFT LEFT UPPER ARM VERSUS HERO GRAFT (Left) as a surgical intervention .  The patient's history has been reviewed, patient examined, no change in status, stable for surgery.  I have reviewed the patient's chart and labs.  Questions were answered to the patient's satisfaction.     Adele Barthel

## 2017-09-18 NOTE — Transfer of Care (Signed)
Immediate Anesthesia Transfer of Care Note  Patient: [name]  Procedure(s) Performed: INSERTION OF ARTERIOVENOUS (AV) GORE-TEX GRAFT LEFT UPPER ARM (Left )  Patient Location: PACU  Anesthesia Type:General  Level of Consciousness: awake, alert  and oriented  Airway & Oxygen Therapy: Patient Spontanous Breathing and Patient connected to nasal cannula oxygen  Post-op Assessment: Report given to RN and Post -op Vital signs reviewed and stable  Post vital signs: Reviewed and stable  Last Vitals:  Vitals:   09/18/17 1152  BP: (!) 219/104  Pulse: 80  Resp: 18  Temp: 36.7 C  SpO2: 97%    Last Pain:  Vitals:   09/18/17 1152  TempSrc: Oral      Patients Stated Pain Goal: 3 (99/37/16 9678)  Complications: No apparent anesthesia complications

## 2017-09-18 NOTE — Op Note (Signed)
OPERATIVE NOTE   PROCEDURE:  redo left upper arm arteriovenous graft  PRE-OPERATIVE DIAGNOSIS: end stage renal disease   POST-OPERATIVE DIAGNOSIS: same as above   SURGEON: Adele Barthel, MD  ASSISTANT(S): Gerri Lins, PAC   ANESTHESIA: general  ESTIMATED BLOOD LOSS: 50 cc  FINDING(S): 1. Axilla vein: 6 mm and compressible, only a couple of valves on probing 2. Brachial artery with minimal disease despite calcific wall 3. Palpable thrill with dopplerable right artery at end of case  SPECIMEN(S):  none  INDICATIONS:   [name] is a 41 y.o. female who presents with end stage renal disease requiring hemodialysis.  The patient present for redo left upper arm arteriovenous graft based on a previous left arm venogram.  Risk, benefits, and alternatives to access surgery were discussed.  The patient is aware the risks include but are not limited to: bleeding, infection, steal syndrome, nerve damage, ischemic monomelic neuropathy, failure to mature, and need for additional procedures.  I had emphasized the concern for steal syndrome given the siginificant atherosclerosis present in this patient's forearm and brachial arteries.  The patient is aware of the risks and elects to proceed forward.   DESCRIPTION: After full informed written consent was obtained from the patient, the patient was brought back to the operating room and placed supine upon the operating table.  The patient was given IV antibiotics prior to proceeding.  After obtaining adequate sedation, the patient was prepped and draped in standard fashion for a left arm access procedure.    I turned my attention first to the antecubitum.  Under ultrasound guidance, I identified the location of the brachial artery and marked it on the skin.  I then examined the bicipital groove and identified the axillary vein and marked it on the skin.    I made an incision over the brachial artery, and dissected down through the subcutaneous  tissue to the fascia carefully and was able to dissect out the brachial artery.  The artery was about 3-3.5 mm externally.  There was obvious atherosclerosis in this artery externally.  It was controlled proximally and distally with vessel loops   I turned my attention to the high bicipital groove.  I made an incision overlying the proximal vein extending through the prior incision and dissected down through the subcutaneous tissue and fascia until I reached the high brachial vein.  The prior upper arm arteriovenous graft was connected to this vein, but the vein remained compressible proximally and distal to this graft.  Externally, the high brachial appeared to be 6 mm in diameter.  I then dissected out this vein proximally and distally.    The patient was given 7000 units of Heparin intravenously, which was a therapeutic bolus.  After waiting 3 minutes, I clamped the high brachial vein proximally and distally and sharply took off the prior arteriovenous graft.  I let the proximal end of this vein to backbleed.  This was sluggish, so I elected to pass a 4 Fogarty proximally.  I encountered no resistance on pull back except for a couple of valves near the venotomy.  Subsequently, I felt this was adequate for use as the venous outflow.  I reclamped the vein proximally.  I tied off the distal segment of the brachial vein with a 2-0 silk tie.  I then dissected out the venous arm of the arteriovenous graft and then clamped the graft distally.  I transected the segment that was blocking the path for a new arteriovenous  graft.  I tied the old graft with a 2-0 silk.    At this point, I took a Dietitian and dissected from the arterial exposure up to the venous exposure.  I then delivered the 4 x 7-mm stretch Gore-Tex graft through the metal tunnel and then pulled out the metal tunnel leaving the graft in place.  The 4-mm end of the graft was left in the arterial exposure and the 7 mm graft was left in the  venous exposure.    I placed the brachial artery under tension proximally and distally with vessel loops.  I then made an arteriotomy and extended it with a Potts scissor.  I sewed the 4-mm end of the graft to this arteriotomy with a running stitch of 7-0 Prolene.  I released the vessel loops on the inflow and allowed the artery to decompress through the graft. There was good pulsatile bleeding through this graft.  I clamped the graft near its arterial anastomosis and sucked out all the blood in the graft and loaded the graft with heparinized saline.    At this point, I pulled the graft to appropriate length and reset my exposure of the high brachial vein.  I then spatulated the vein to facilitate an end-to-end anastomosis, removing the disease segment that had previously been sewn to the graft.  I also spatulated the graft to facilitate an end-to-end anastomosis.  In the process of spatulating, I cut the graft to appropriate length for this anastomosis.  This graft was sewn to the vein in an end-to-end configuration with a 6-0 Prolene.  Prior to completing this anastomosis, I allowed the vein to backbleed.  I also allowed the graft to bleed in an antegrade fashion.  There was no clot observe from either end of the anastomosis.  I completed this anastomosis in the usual fashion.  At this point, I irrigated out the venous exposure and then packed Avitene in this incision.  I turned my attention back to the arterial exposure.  This incision was washed out also.  I repacked the incision with Avitene.  I tested the distal arterial flow with a continuous doppler: distal radial signal was dopplerable.  The brachial artery proximally and distally had multiphasic waveforms.  The venous outflow had a flow signature consistent with a widely patent arteriovenous graft.  By the end of the case, I could feel a thrill in this arteriovenous graft.  At this point, the patient was given 40 mg of Protamine to reverse  anticoagulation.  I washed out the arterial exposure.  There was no more active bleeding.  The subcutaneous tissue was reapproximated with a running stitch of 3-0 Vicryl.  The skin was then reapproximated with a running subcuticular 4-0 Monocryl.  The skin was then cleaned, dried, and Dermabond used to reinforce the skin closure.  I then repeated this same process in the vein exposure.  Similarly the bleeding was evaluated and the subcutaneous tissue and skin reapproximated and closed.   COMPLICATIONS: none  CONDITION: stable   Adele Barthel, MD, [facility] [place] Office: [phone] Pager: [phone]  09/18/2017, 2:24 PM

## 2017-09-18 NOTE — Discharge Instructions (Signed)
° °  [facility] [place] ° °Discharge Instructions ° °AV Fistula or Graft Surgery for Dialysis Access ° °Please refer to the following instructions for your post-procedure care. Your surgeon or physician assistant will discuss any changes with you. ° °Activity ° °You may drive the day following your surgery, if you are comfortable and no longer taking prescription pain medication. Resume full activity as the soreness in your incision resolves. ° °Bathing/Showering ° °You may shower after you go home. Keep your incision dry for 48 hours. Do not soak in a bathtub, hot tub, or swim until the incision heals completely. You may not shower if you have a hemodialysis catheter. ° °Incision Care ° °Clean your incision with mild soap and water after 48 hours. Pat the area dry with a clean towel. You do not need a bandage unless otherwise instructed. Do not apply any ointments or creams to your incision. You may have skin glue on your incision. Do not peel it off. It will come off on its own in about one week. Your arm may swell a bit after surgery. To reduce swelling use pillows to elevate your arm so it is above your heart. Your doctor will tell you if you need to lightly wrap your arm with an ACE bandage. ° °Diet ° °Resume your normal diet. There are not special food restrictions following this procedure. In order to heal from your surgery, it is CRITICAL to get adequate nutrition. Your body requires vitamins, minerals, and protein. Vegetables are the best source of vitamins and minerals. Vegetables also provide the perfect balance of protein. Processed food has little nutritional value, so try to avoid this. ° °Medications ° °Resume taking all of your medications. If your incision is causing pain, you may take over-the counter pain relievers such as acetaminophen (Tylenol). If you were prescribed a stronger pain medication, please be aware these medications can cause nausea and constipation. Prevent  nausea by taking the medication with a snack or meal. Avoid constipation by drinking plenty of fluids and eating foods with high amount of fiber, such as fruits, vegetables, and grains. Do not take Tylenol if you are taking prescription pain medications. ° ° ° ° °Follow up °Your surgeon may want to see you in the office following your access surgery. If so, this will be arranged at the time of your surgery. ° °Please call us immediately for any of the following conditions: ° °Increased pain, redness, drainage (pus) from your incision site °Fever of 101 degrees or higher °Severe or worsening pain at your incision site °Hand pain or numbness. ° °Reduce your risk of vascular disease: ° °Stop smoking. If you would like help, call [facility] at 1-800-QUIT-NOW (1-800-784-8669) or [facility] at 336-586-4000 ° °Manage your cholesterol °Maintain a desired weight °Control your diabetes °Keep your blood pressure down ° °Dialysis ° °It will take several weeks to several months for your new dialysis access to be ready for use. Your surgeon will determine when it is OK to use it. Your nephrologist will continue to direct your dialysis. You can continue to use your Permcath until your new access is ready for use. ° °If you have any questions, please call the office at [phone]. ° °

## 2017-09-18 NOTE — Anesthesia Postprocedure Evaluation (Signed)
Anesthesia Post Note  Patient: [name]  Procedure(s) Performed: INSERTION OF ARTERIOVENOUS (AV) GORE-TEX GRAFT LEFT UPPER ARM (Left )     Patient location during evaluation: PACU Anesthesia Type: General Level of consciousness: sedated Pain management: pain level controlled Vital Signs Assessment: post-procedure vital signs reviewed and stable Respiratory status: spontaneous breathing and respiratory function stable Cardiovascular status: stable Postop Assessment: no apparent nausea or vomiting Anesthetic complications: no    Last Vitals:  Vitals:   09/18/17 1510 09/18/17 1525  BP: (!) 161/76 (!) 169/79  Pulse: 77 80  Resp: 14 12  Temp:    SpO2: 99% 91%    Last Pain:  Vitals:   09/18/17 1500  TempSrc:   PainSc: 5                  Merlen Gurry DANIEL

## 2017-09-18 NOTE — Anesthesia Procedure Notes (Signed)
Procedure Name: LMA Insertion Date/Time: 09/18/2017 12:55 PM Performed by: Mariea Clonts, CRNA Pre-anesthesia Checklist: Patient identified, Emergency Drugs available, Suction available and Patient being monitored Patient Re-evaluated:Patient Re-evaluated prior to induction Oxygen Delivery Method: Circle System Utilized Preoxygenation: Pre-oxygenation with 100% oxygen Induction Type: IV induction Ventilation: Mask ventilation without difficulty LMA: LMA inserted LMA Size: 4.0 Number of attempts: 1 Airway Equipment and Method: Bite block Placement Confirmation: positive ETCO2 Tube secured with: Tape Dental Injury: Teeth and Oropharynx as per pre-operative assessment

## 2017-09-18 NOTE — Interval H&P Note (Signed)
   History and Physical Update (Correction)  This patient is scheduled for a LUA AVG vs LUA Hybrid AVG placement.   Risk, benefits, and alternatives to access surgery were discussed.    The patient is aware the risks include but are not limited to: bleeding, infection, steal syndrome, nerve damage, ischemic monomelic neuropathy, thrombosis, failure to mature, complications related to venous hypertension, need for additional procedures, death and stroke.    This patient is well aware of the increased risk for steal syndrome she has due known bilateral forearm atherosclerosis due to her diabetes. I discussed with the patient the nature of angiographic procedures, especially the limited patencies of any endovascular intervention.   The patient is aware of that the risks of an angiographic procedure include but are not limited to: bleeding, infection, access site complications, renal failure, embolization, rupture of vessel, dissection, arteriovenous fistula, possible need for emergent surgical intervention, possible need for surgical procedures to treat the patient's pathology, anaphylactic reaction to contrast, and stroke and death.    The patient agrees to proceed forward with the procedure.   Adele Barthel, MD, FACS [facility] [place] Office: [phone] Pager: [phone]  09/18/2017, 12:32 PM

## 2017-09-19 ENCOUNTER — Telehealth: Payer: Self-pay | Admitting: Vascular Surgery

## 2017-09-19 ENCOUNTER — Encounter (HOSPITAL_COMMUNITY): Payer: Self-pay | Admitting: Vascular Surgery

## 2017-09-19 NOTE — Telephone Encounter (Signed)
-----   Message from Mena Goes, RN sent at 09/18/2017  3:09 PM EST ----- Regarding: 4 weeks   ----- Message ----- From: Ulyses Amor, PA-C Sent: 09/18/2017   2:51 PM To: Vvs Charge Pool  F/U in 4 weeks with Dr. Bridgett Larsson s/p left av graft

## 2017-09-19 NOTE — Telephone Encounter (Signed)
Sched appt 10/24/17 at 3:00. Spoke to husband.

## 2017-10-10 ENCOUNTER — Telehealth: Payer: Self-pay | Admitting: *Deleted

## 2017-10-10 NOTE — Telephone Encounter (Signed)
Call from [facility] re infection with exudate at A/V graft. States arm is not really" red and angry looking" Patient was given antibiotics today and area was cleaned by RN at [facility] this am. Patient scheduled to return to center on Saturday. Dialysis via [facility] at present.  Instructed Tom to send patient to the ER for evaluation and treatment.

## 2017-10-17 ENCOUNTER — Encounter: Payer: Medicare Other | Admitting: Vascular Surgery

## 2017-10-22 NOTE — Progress Notes (Signed)
    Postoperative Access Visit   History of Present Illness   [name] is a 42 y.o. year old female who presents for postoperative follow-up for: redo left upper arm arteriovenous graft (Date: 09/18/17).  The patient's wounds are healed.  The patient notes no steal symptoms.  The patient is able to complete their activities of daily living.  The patient's current symptoms are: none.  Reported last week, patient was have chills and some drainage from arterial anastomosis.  Pt has been getting abx on HD.   Physical Examination   Vitals:   10/24/17 1528  BP: 116/72  Pulse: (!) 105  Resp: 18  Temp: 98.5 F (36.9 C)  TempSrc: Oral  SpO2: 96%  Weight: 148 lb (67.1 kg)  Height: 5\' 7"  (1.702 m)    left arm Arterial Incision is essentially healed with small area of residual scab, Axillary incision is healed, graft has palpable thrill without any fluid around the graft, skin feels warm, left hand grip is 5/5, sensation in digits is intact, palpable thrill, bruit can be auscultated, radial is faintly palpable  Right hand Fingers look markedly better with re-epithelization in process, hand grip improved to 3/5   Medical Decision Making   [name] is a 42 y.o. year old female who presents s/p redo left upper arm arteriovenous graft   I see no evidence of infection of this redo LUA AVG.  I would have a higher suspicion of the [facility].  The patient's access is ready for use.  Continue rehab on R hand.  Close eye on L hand for any evidence of steal.  Thank you for allowing [place] to participate in this patient's care.   Adele Barthel, MD, FACS [facility] [place] Office: [phone] Pager: [phone]

## 2017-10-24 ENCOUNTER — Encounter: Payer: Medicare Other | Admitting: Vascular Surgery

## 2017-10-24 ENCOUNTER — Encounter: Payer: Self-pay | Admitting: Vascular Surgery

## 2017-10-24 ENCOUNTER — Ambulatory Visit (INDEPENDENT_AMBULATORY_CARE_PROVIDER_SITE_OTHER): Payer: Medicare Other | Admitting: Vascular Surgery

## 2017-10-24 VITALS — BP 116/72 | HR 105 | Temp 98.5°F | Resp 18 | Ht 67.0 in | Wt 148.0 lb

## 2017-10-24 DIAGNOSIS — Z992 Dependence on renal dialysis: Secondary | ICD-10-CM

## 2017-10-24 DIAGNOSIS — N186 End stage renal disease: Secondary | ICD-10-CM

## 2017-12-17 ENCOUNTER — Inpatient Hospital Stay (HOSPITAL_COMMUNITY): Payer: Medicare Other | Admitting: Certified Registered"

## 2017-12-17 ENCOUNTER — Inpatient Hospital Stay (HOSPITAL_COMMUNITY)
Admission: EM | Admit: 2017-12-17 | Discharge: 2018-01-05 | DRG: 871 | Disposition: E | Payer: Medicare Other | Attending: Pulmonary Disease | Admitting: Pulmonary Disease

## 2017-12-17 ENCOUNTER — Encounter: Payer: Self-pay | Admitting: *Deleted

## 2017-12-17 ENCOUNTER — Emergency Department (HOSPITAL_COMMUNITY): Payer: Medicare Other

## 2017-12-17 ENCOUNTER — Encounter (HOSPITAL_COMMUNITY): Payer: Self-pay

## 2017-12-17 ENCOUNTER — Inpatient Hospital Stay (HOSPITAL_COMMUNITY): Payer: Medicare Other

## 2017-12-17 DIAGNOSIS — Z888 Allergy status to other drugs, medicaments and biological substances status: Secondary | ICD-10-CM | POA: Diagnosis not present

## 2017-12-17 DIAGNOSIS — I120 Hypertensive chronic kidney disease with stage 5 chronic kidney disease or end stage renal disease: Secondary | ICD-10-CM | POA: Diagnosis present

## 2017-12-17 DIAGNOSIS — T82898A Other specified complication of vascular prosthetic devices, implants and grafts, initial encounter: Secondary | ICD-10-CM | POA: Diagnosis present

## 2017-12-17 DIAGNOSIS — G9341 Metabolic encephalopathy: Secondary | ICD-10-CM | POA: Diagnosis present

## 2017-12-17 DIAGNOSIS — Z90410 Acquired total absence of pancreas: Secondary | ICD-10-CM

## 2017-12-17 DIAGNOSIS — E872 Acidosis: Secondary | ICD-10-CM | POA: Diagnosis present

## 2017-12-17 DIAGNOSIS — Z8611 Personal history of tuberculosis: Secondary | ICD-10-CM | POA: Diagnosis not present

## 2017-12-17 DIAGNOSIS — E871 Hypo-osmolality and hyponatremia: Secondary | ICD-10-CM | POA: Diagnosis present

## 2017-12-17 DIAGNOSIS — R6521 Severe sepsis with septic shock: Secondary | ICD-10-CM

## 2017-12-17 DIAGNOSIS — Z4659 Encounter for fitting and adjustment of other gastrointestinal appliance and device: Secondary | ICD-10-CM

## 2017-12-17 DIAGNOSIS — E11319 Type 2 diabetes mellitus with unspecified diabetic retinopathy without macular edema: Secondary | ICD-10-CM | POA: Diagnosis present

## 2017-12-17 DIAGNOSIS — Z79899 Other long term (current) drug therapy: Secondary | ICD-10-CM

## 2017-12-17 DIAGNOSIS — E1143 Type 2 diabetes mellitus with diabetic autonomic (poly)neuropathy: Secondary | ICD-10-CM | POA: Diagnosis present

## 2017-12-17 DIAGNOSIS — T86891 Other transplanted tissue failure: Secondary | ICD-10-CM | POA: Diagnosis present

## 2017-12-17 DIAGNOSIS — T8612 Kidney transplant failure: Secondary | ICD-10-CM | POA: Diagnosis present

## 2017-12-17 DIAGNOSIS — Z992 Dependence on renal dialysis: Secondary | ICD-10-CM | POA: Diagnosis not present

## 2017-12-17 DIAGNOSIS — I468 Cardiac arrest due to other underlying condition: Secondary | ICD-10-CM | POA: Diagnosis not present

## 2017-12-17 DIAGNOSIS — A419 Sepsis, unspecified organism: Principal | ICD-10-CM | POA: Diagnosis present

## 2017-12-17 DIAGNOSIS — J1008 Influenza due to other identified influenza virus with other specified pneumonia: Secondary | ICD-10-CM | POA: Diagnosis present

## 2017-12-17 DIAGNOSIS — Z66 Do not resuscitate: Secondary | ICD-10-CM | POA: Diagnosis present

## 2017-12-17 DIAGNOSIS — R579 Shock, unspecified: Secondary | ICD-10-CM | POA: Diagnosis not present

## 2017-12-17 DIAGNOSIS — Z515 Encounter for palliative care: Secondary | ICD-10-CM | POA: Diagnosis present

## 2017-12-17 DIAGNOSIS — Z7982 Long term (current) use of aspirin: Secondary | ICD-10-CM | POA: Diagnosis not present

## 2017-12-17 DIAGNOSIS — E1122 Type 2 diabetes mellitus with diabetic chronic kidney disease: Secondary | ICD-10-CM | POA: Diagnosis present

## 2017-12-17 DIAGNOSIS — E8770 Fluid overload, unspecified: Secondary | ICD-10-CM | POA: Diagnosis present

## 2017-12-17 DIAGNOSIS — E0822 Diabetes mellitus due to underlying condition with diabetic chronic kidney disease: Secondary | ICD-10-CM | POA: Diagnosis not present

## 2017-12-17 DIAGNOSIS — J9600 Acute respiratory failure, unspecified whether with hypoxia or hypercapnia: Secondary | ICD-10-CM | POA: Diagnosis present

## 2017-12-17 DIAGNOSIS — N186 End stage renal disease: Secondary | ICD-10-CM

## 2017-12-17 DIAGNOSIS — N2581 Secondary hyperparathyroidism of renal origin: Secondary | ICD-10-CM | POA: Diagnosis present

## 2017-12-17 DIAGNOSIS — J189 Pneumonia, unspecified organism: Secondary | ICD-10-CM | POA: Diagnosis not present

## 2017-12-17 DIAGNOSIS — I469 Cardiac arrest, cause unspecified: Secondary | ICD-10-CM | POA: Diagnosis not present

## 2017-12-17 DIAGNOSIS — J9601 Acute respiratory failure with hypoxia: Secondary | ICD-10-CM | POA: Diagnosis present

## 2017-12-17 DIAGNOSIS — Z90721 Acquired absence of ovaries, unilateral: Secondary | ICD-10-CM | POA: Diagnosis not present

## 2017-12-17 DIAGNOSIS — Z794 Long term (current) use of insulin: Secondary | ICD-10-CM

## 2017-12-17 DIAGNOSIS — Y841 Kidney dialysis as the cause of abnormal reaction of the patient, or of later complication, without mention of misadventure at the time of the procedure: Secondary | ICD-10-CM | POA: Diagnosis present

## 2017-12-17 DIAGNOSIS — I248 Other forms of acute ischemic heart disease: Secondary | ICD-10-CM | POA: Diagnosis present

## 2017-12-17 DIAGNOSIS — J181 Lobar pneumonia, unspecified organism: Secondary | ICD-10-CM | POA: Diagnosis present

## 2017-12-17 DIAGNOSIS — Z9981 Dependence on supplemental oxygen: Secondary | ICD-10-CM

## 2017-12-17 DIAGNOSIS — Z7189 Other specified counseling: Secondary | ICD-10-CM | POA: Diagnosis not present

## 2017-12-17 DIAGNOSIS — E162 Hypoglycemia, unspecified: Secondary | ICD-10-CM

## 2017-12-17 DIAGNOSIS — G9340 Encephalopathy, unspecified: Secondary | ICD-10-CM

## 2017-12-17 DIAGNOSIS — Z91048 Other nonmedicinal substance allergy status: Secondary | ICD-10-CM

## 2017-12-17 DIAGNOSIS — I10 Essential (primary) hypertension: Secondary | ICD-10-CM | POA: Diagnosis present

## 2017-12-17 DIAGNOSIS — D638 Anemia in other chronic diseases classified elsewhere: Secondary | ICD-10-CM | POA: Diagnosis present

## 2017-12-17 DIAGNOSIS — E119 Type 2 diabetes mellitus without complications: Secondary | ICD-10-CM

## 2017-12-17 DIAGNOSIS — K3184 Gastroparesis: Secondary | ICD-10-CM | POA: Diagnosis present

## 2017-12-17 DIAGNOSIS — E11649 Type 2 diabetes mellitus with hypoglycemia without coma: Secondary | ICD-10-CM | POA: Diagnosis present

## 2017-12-17 LAB — POCT I-STAT 3, ART BLOOD GAS (G3+)
Acid-base deficit: 13 mmol/L — ABNORMAL HIGH (ref 0.0–2.0)
Acid-base deficit: 19 mmol/L — ABNORMAL HIGH (ref 0.0–2.0)
Acid-base deficit: 14 mmol/L — ABNORMAL HIGH (ref 0.0–2.0)
BICARBONATE: 13.8 mmol/L — AB (ref 20.0–28.0)
Bicarbonate: 13.7 mmol/L — ABNORMAL LOW (ref 20.0–28.0)
Bicarbonate: 11.9 mmol/L — ABNORMAL LOW (ref 20.0–28.0)
O2 SAT: 98 %
O2 Saturation: 100 %
O2 Saturation: 95 %
PCO2 ART: 52 mmHg — AB (ref 32.0–48.0)
PH ART: 7.2 — AB (ref 7.350–7.450)
PO2 ART: 94 mmHg (ref 83.0–108.0)
Patient temperature: 99.7
TCO2: 15 mmol/L — AB (ref 22–32)
TCO2: 13 mmol/L — AB (ref 22–32)
TCO2: 15 mmol/L — AB (ref 22–32)
pCO2 arterial: 35.3 mmHg (ref 32.0–48.0)
pCO2 arterial: 39.6 mmHg (ref 32.0–48.0)
pH, Arterial: 6.973 — CL (ref 7.350–7.450)
pH, Arterial: 7.151 — CL (ref 7.350–7.450)
pO2, Arterial: 227 mmHg — ABNORMAL HIGH (ref 83.0–108.0)
pO2, Arterial: 174 mmHg — ABNORMAL HIGH (ref 83.0–108.0)

## 2017-12-17 LAB — I-STAT CHEM 8, ED
BUN: 69 mg/dL — ABNORMAL HIGH (ref 6–20)
CALCIUM ION: 0.99 mmol/L — AB (ref 1.15–1.40)
CHLORIDE: 92 mmol/L — AB (ref 101–111)
Creatinine, Ser: 7.7 mg/dL — ABNORMAL HIGH (ref 0.44–1.00)
GLUCOSE: 63 mg/dL — AB (ref 65–99)
HCT: 40 % (ref 36.0–46.0)
Hemoglobin: 13.6 g/dL (ref 12.0–15.0)
Potassium: 4 mmol/L (ref 3.5–5.1)
Sodium: 128 mmol/L — ABNORMAL LOW (ref 135–145)
TCO2: 11 mmol/L — ABNORMAL LOW (ref 22–32)

## 2017-12-17 LAB — BRAIN NATRIURETIC PEPTIDE

## 2017-12-17 LAB — BASIC METABOLIC PANEL
Anion gap: 35 — ABNORMAL HIGH (ref 5–15)
Anion gap: 28 — ABNORMAL HIGH (ref 5–15)
BUN: 79 mg/dL — AB (ref 6–20)
BUN: 79 mg/dL — ABNORMAL HIGH (ref 6–20)
CHLORIDE: 87 mmol/L — AB (ref 101–111)
CHLORIDE: 95 mmol/L — AB (ref 101–111)
CO2: 9 mmol/L — AB (ref 22–32)
CO2: 9 mmol/L — AB (ref 22–32)
CREATININE: 7.98 mg/dL — AB (ref 0.44–1.00)
Calcium: 8.9 mg/dL (ref 8.9–10.3)
Calcium: 8.2 mg/dL — ABNORMAL LOW (ref 8.9–10.3)
Creatinine, Ser: 7.76 mg/dL — ABNORMAL HIGH (ref 0.44–1.00)
GFR calc Af Amer: 6 mL/min — ABNORMAL LOW (ref >60)
GFR calc Af Amer: 7 mL/min — ABNORMAL LOW (ref >60)
GFR calc non Af Amer: 6 mL/min — ABNORMAL LOW (ref >60)
GFR calc non Af Amer: 6 mL/min — ABNORMAL LOW (ref >60)
Glucose, Bld: 61 mg/dL — ABNORMAL LOW (ref 65–99)
Glucose, Bld: 88 mg/dL (ref 65–99)
POTASSIUM: 4.1 mmol/L (ref 3.5–5.1)
POTASSIUM: 4 mmol/L (ref 3.5–5.1)
SODIUM: 131 mmol/L — AB (ref 135–145)
SODIUM: 132 mmol/L — AB (ref 135–145)

## 2017-12-17 LAB — I-STAT ARTERIAL BLOOD GAS, ED
Acid-base deficit: 17 mmol/L — ABNORMAL HIGH (ref 0.0–2.0)
Bicarbonate: 7.8 mmol/L — ABNORMAL LOW (ref 20.0–28.0)
O2 Saturation: 99 %
PCO2 ART: 18.2 mmHg — AB (ref 32.0–48.0)
PH ART: 7.246 — AB (ref 7.350–7.450)
Patient temperature: 100.5
TCO2: 8 mmol/L — ABNORMAL LOW (ref 22–32)
pO2, Arterial: 152 mmHg — ABNORMAL HIGH (ref 83.0–108.0)

## 2017-12-17 LAB — COMPREHENSIVE METABOLIC PANEL
ALK PHOS: 323 U/L — AB (ref 38–126)
ALT: 22 U/L (ref 14–54)
ANION GAP: 24 — AB (ref 5–15)
AST: 58 U/L — ABNORMAL HIGH (ref 15–41)
Albumin: 2.1 g/dL — ABNORMAL LOW (ref 3.5–5.0)
BILIRUBIN TOTAL: 1.1 mg/dL (ref 0.3–1.2)
BUN: 21 mg/dL — ABNORMAL HIGH (ref 6–20)
CALCIUM: 8.2 mg/dL — AB (ref 8.9–10.3)
CO2: 15 mmol/L — ABNORMAL LOW (ref 22–32)
Chloride: 100 mmol/L — ABNORMAL LOW (ref 101–111)
Creatinine, Ser: 3.18 mg/dL — ABNORMAL HIGH (ref 0.44–1.00)
GFR, EST AFRICAN AMERICAN: 20 mL/min — AB (ref >60)
GFR, EST NON AFRICAN AMERICAN: 17 mL/min — AB (ref >60)
GLUCOSE: 65 mg/dL (ref 65–99)
Potassium: 4.5 mmol/L (ref 3.5–5.1)
Sodium: 139 mmol/L (ref 135–145)
TOTAL PROTEIN: 6.5 g/dL (ref 6.5–8.1)

## 2017-12-17 LAB — GLUCOSE, CAPILLARY
GLUCOSE-CAPILLARY: 85 mg/dL (ref 65–99)
Glucose-Capillary: 107 mg/dL — ABNORMAL HIGH (ref 65–99)
Glucose-Capillary: <10 mg/dL — CL (ref 65–99)
Glucose-Capillary: 62 mg/dL — ABNORMAL LOW (ref 65–99)
Glucose-Capillary: 109 mg/dL — ABNORMAL HIGH (ref 65–99)
Glucose-Capillary: 126 mg/dL — ABNORMAL HIGH (ref 65–99)

## 2017-12-17 LAB — CBC
HEMATOCRIT: 28.5 % — AB (ref 36.0–46.0)
HEMATOCRIT: 28.6 % — AB (ref 36.0–46.0)
HEMOGLOBIN: 9.7 g/dL — AB (ref 12.0–15.0)
HEMOGLOBIN: 9.3 g/dL — AB (ref 12.0–15.0)
MCH: 32.6 pg (ref 26.0–34.0)
MCH: 31.3 pg (ref 26.0–34.0)
MCHC: 34 g/dL (ref 30.0–36.0)
MCHC: 32.5 g/dL (ref 30.0–36.0)
MCV: 95.6 fL (ref 78.0–100.0)
MCV: 96.3 fL (ref 78.0–100.0)
Platelets: 272 10*3/uL (ref 150–400)
Platelets: 315 10*3/uL (ref 150–400)
RBC: 2.98 MIL/uL — ABNORMAL LOW (ref 3.87–5.11)
RBC: 2.97 MIL/uL — ABNORMAL LOW (ref 3.87–5.11)
RDW: 16.4 % — ABNORMAL HIGH (ref 11.5–15.5)
RDW: 16.3 % — ABNORMAL HIGH (ref 11.5–15.5)
WBC: 66.2 10*3/uL (ref 4.0–10.5)
WBC: 70.8 10*3/uL (ref 4.0–10.5)

## 2017-12-17 LAB — BLOOD CULTURE ID PANEL (REFLEXED)
Acinetobacter baumannii: NOT DETECTED
CANDIDA ALBICANS: NOT DETECTED
CANDIDA KRUSEI: NOT DETECTED
CANDIDA PARAPSILOSIS: NOT DETECTED
CANDIDA TROPICALIS: NOT DETECTED
Candida glabrata: NOT DETECTED
ENTEROBACTERIACEAE SPECIES: NOT DETECTED
ESCHERICHIA COLI: NOT DETECTED
Enterobacter cloacae complex: NOT DETECTED
Enterococcus species: NOT DETECTED
HAEMOPHILUS INFLUENZAE: NOT DETECTED
KLEBSIELLA OXYTOCA: NOT DETECTED
KLEBSIELLA PNEUMONIAE: NOT DETECTED
Listeria monocytogenes: NOT DETECTED
METHICILLIN RESISTANCE: DETECTED — AB
Neisseria meningitidis: NOT DETECTED
PSEUDOMONAS AERUGINOSA: NOT DETECTED
Proteus species: NOT DETECTED
STREPTOCOCCUS PYOGENES: NOT DETECTED
Serratia marcescens: NOT DETECTED
Staphylococcus aureus (BCID): DETECTED — AB
Staphylococcus species: DETECTED — AB
Streptococcus agalactiae: NOT DETECTED
Streptococcus pneumoniae: NOT DETECTED
Streptococcus species: NOT DETECTED

## 2017-12-17 LAB — CBC WITH DIFFERENTIAL/PLATELET
BASOS ABS: 0 10*3/uL (ref 0.0–0.1)
Basophils Relative: 0 %
EOS ABS: 0 10*3/uL (ref 0.0–0.7)
EOS PCT: 0 %
HCT: 35.3 % — ABNORMAL LOW (ref 36.0–46.0)
Hemoglobin: 12 g/dL (ref 12.0–15.0)
Lymphocytes Relative: 4 %
Lymphs Abs: 2.9 10*3/uL (ref 0.7–4.0)
MCH: 32.6 pg (ref 26.0–34.0)
MCHC: 34 g/dL (ref 30.0–36.0)
MCV: 95.9 fL (ref 78.0–100.0)
MONOS PCT: 6 %
Monocytes Absolute: 4.3 10*3/uL — ABNORMAL HIGH (ref 0.1–1.0)
Neutro Abs: 64.7 10*3/uL — ABNORMAL HIGH (ref 1.7–7.7)
Neutrophils Relative %: 90 %
PLATELETS: 342 10*3/uL (ref 150–400)
RBC: 3.68 MIL/uL — AB (ref 3.87–5.11)
RDW: 15.9 % — ABNORMAL HIGH (ref 11.5–15.5)
WBC: 71.9 10*3/uL (ref 4.0–10.5)

## 2017-12-17 LAB — INFLUENZA PANEL BY PCR (TYPE A & B)
Influenza A By PCR: POSITIVE — AB
Influenza B By PCR: NEGATIVE

## 2017-12-17 LAB — VANCOMYCIN, RANDOM: Vancomycin Rm: <4

## 2017-12-17 LAB — PATHOLOGIST SMEAR REVIEW

## 2017-12-17 LAB — TROPONIN I
TROPONIN I: 0.12 ng/mL — AB (ref <0.03)
Troponin I: 0.09 ng/mL (ref <0.03)
Troponin I: 0.58 ng/mL (ref <0.03)
Troponin I: 0.53 ng/mL (ref <0.03)

## 2017-12-17 LAB — CBG MONITORING, ED
GLUCOSE-CAPILLARY: 38 mg/dL — AB (ref 65–99)
Glucose-Capillary: 131 mg/dL — ABNORMAL HIGH (ref 65–99)
Glucose-Capillary: 83 mg/dL (ref 65–99)
Glucose-Capillary: 98 mg/dL (ref 65–99)
Glucose-Capillary: 60 mg/dL — ABNORMAL LOW (ref 65–99)

## 2017-12-17 LAB — PROTIME-INR
INR: 1.17
PROTHROMBIN TIME: 14.8 s (ref 11.4–15.2)

## 2017-12-17 LAB — I-STAT CG4 LACTIC ACID, ED
LACTIC ACID, VENOUS: 3.71 mmol/L — AB (ref 0.5–1.9)
Lactic Acid, Venous: 2.07 mmol/L (ref 0.5–1.9)

## 2017-12-17 LAB — LACTIC ACID, PLASMA
LACTIC ACID, VENOUS: 1.9 mmol/L (ref 0.5–1.9)
LACTIC ACID, VENOUS: 11.4 mmol/L — AB (ref 0.5–1.9)

## 2017-12-17 LAB — PROCALCITONIN: Procalcitonin: >150 ng/mL

## 2017-12-17 LAB — MAGNESIUM: Magnesium: 2.1 mg/dL (ref 1.7–2.4)

## 2017-12-17 LAB — PHOSPHORUS: PHOSPHORUS: 7.8 mg/dL — AB (ref 2.5–4.6)

## 2017-12-17 LAB — MRSA PCR SCREENING: MRSA by PCR: POSITIVE — AB

## 2017-12-17 MED ORDER — FAMOTIDINE 40 MG/5ML PO SUSR: 20.0000 mg | Freq: Every day | ORAL | Status: DC

## 2017-12-17 MED ORDER — VANCOMYCIN HCL IN DEXTROSE 750-5 MG/150ML-% IV SOLN
750.0000 mg | INTRAVENOUS | Status: DC
Start: 2017-12-17 — End: 2017-12-17
  Administered 2017-12-17: 750 mg via INTRAVENOUS
  Filled 2017-12-17 (×2): qty 150

## 2017-12-17 MED ORDER — HEPARIN SODIUM (PORCINE) 1000 UNIT/ML DIALYSIS: 20.0000 [IU]/kg | INTRAMUSCULAR | Status: DC | PRN

## 2017-12-17 MED ORDER — CISATRACURIUM BOLUS VIA INFUSION
2.5000 mg | Freq: Once | INTRAVENOUS | Status: DC
  Filled 2017-12-17: qty 3

## 2017-12-17 MED ORDER — LORAZEPAM 2 MG/ML IJ SOLN
INTRAMUSCULAR | Status: AC
  Filled 2017-12-17: qty 1

## 2017-12-17 MED ORDER — HEPARIN SODIUM (PORCINE) 5000 UNIT/ML IJ SOLN
5000.0000 [IU] | Freq: Three times a day (TID) | INTRAMUSCULAR | Status: DC
  Administered 2017-12-17 (×2): 5000 [IU] via SUBCUTANEOUS
  Filled 2017-12-17 (×3): qty 1

## 2017-12-17 MED ORDER — SODIUM CHLORIDE 0.9 % IV SOLN: 100.0000 mL | INTRAVENOUS | Status: DC | PRN

## 2017-12-17 MED ORDER — SODIUM CHLORIDE 0.9 % IV SOLN
0.0000 ug/min | INTRAVENOUS | Status: DC
  Administered 2017-12-17: 20 ug/min via INTRAVENOUS
  Filled 2017-12-17 (×3): qty 1

## 2017-12-17 MED ORDER — DEXTROSE 50 % IV SOLN
INTRAVENOUS | Status: AC
  Administered 2017-12-17: 50 mL
  Filled 2017-12-17: qty 50

## 2017-12-17 MED ORDER — FENTANYL CITRATE (PF) 100 MCG/2ML IJ SOLN: 100.0000 ug | INTRAMUSCULAR | Status: DC | PRN

## 2017-12-17 MED ORDER — DEXTROSE 5 % IV SOLN: INTRAVENOUS | Status: DC

## 2017-12-17 MED ORDER — ORAL CARE MOUTH RINSE
15.0000 mL | Freq: Four times a day (QID) | OROMUCOSAL | Status: DC
  Administered 2017-12-17: 15 mL via OROMUCOSAL

## 2017-12-17 MED ORDER — MIDAZOLAM HCL 2 MG/2ML IJ SOLN: 2.0000 mg | Freq: Once | INTRAMUSCULAR | Status: DC | PRN

## 2017-12-17 MED ORDER — SODIUM CHLORIDE 0.9 % IV BOLUS (SEPSIS)
1000.0000 mL | Freq: Once | INTRAVENOUS | Status: AC
  Administered 2017-12-17: 1000 mL via INTRAVENOUS

## 2017-12-17 MED ORDER — IPRATROPIUM-ALBUTEROL 0.5-2.5 (3) MG/3ML IN SOLN: 3.0000 mL | RESPIRATORY_TRACT | Status: DC | PRN

## 2017-12-17 MED ORDER — PRISMASOL BGK 4/2.5 32-4-2.5 MEQ/L IV SOLN
INTRAVENOUS | Status: DC
  Filled 2017-12-17 (×3): qty 5000

## 2017-12-17 MED ORDER — PENTAFLUOROPROP-TETRAFLUOROETH EX AERO: 1.0000 "application " | INHALATION_SPRAY | CUTANEOUS | Status: DC | PRN

## 2017-12-17 MED ORDER — VASOPRESSIN 20 UNIT/ML IV SOLN
0.0300 [IU]/min | INTRAVENOUS | Status: DC
  Administered 2017-12-17: 0.03 [IU]/min via INTRAVENOUS
  Filled 2017-12-17: qty 2

## 2017-12-17 MED ORDER — DEXTROSE 10 % IV SOLN: INTRAVENOUS | Status: DC

## 2017-12-17 MED ORDER — DEXTROSE 5 % IV SOLN
0.0000 ug/min | INTRAVENOUS | Status: DC
  Administered 2017-12-17: 40 ug/min via INTRAVENOUS
  Filled 2017-12-17 (×2): qty 16

## 2017-12-17 MED ORDER — LIDOCAINE HCL (PF) 1 % IJ SOLN: 5.0000 mL | INTRAMUSCULAR | Status: DC | PRN

## 2017-12-17 MED ORDER — OSELTAMIVIR PHOSPHATE 30 MG PO CAPS
30.0000 mg | ORAL_CAPSULE | ORAL | Status: DC
  Filled 2017-12-17 (×2): qty 1

## 2017-12-17 MED ORDER — SODIUM CHLORIDE 0.9 % IV SOLN
0.5000 ug/kg/min | INTRAVENOUS | Status: DC
  Filled 2017-12-17: qty 20

## 2017-12-17 MED ORDER — SODIUM CHLORIDE 0.9% FLUSH: 10.0000 mL | Freq: Two times a day (BID) | INTRAVENOUS | Status: DC

## 2017-12-17 MED ORDER — HEPARIN SODIUM (PORCINE) 1000 UNIT/ML DIALYSIS
1000.0000 [IU] | INTRAMUSCULAR | Status: DC | PRN
  Filled 2017-12-17: qty 6

## 2017-12-17 MED ORDER — CHLORHEXIDINE GLUCONATE 0.12% ORAL RINSE (MEDLINE KIT): 15.0000 mL | Freq: Two times a day (BID) | OROMUCOSAL | Status: DC

## 2017-12-17 MED ORDER — ACETAMINOPHEN 650 MG RE SUPP: 650.0000 mg | Freq: Four times a day (QID) | RECTAL | Status: DC | PRN

## 2017-12-17 MED ORDER — HEPARIN SODIUM (PORCINE) 1000 UNIT/ML DIALYSIS
1000.0000 [IU] | INTRAMUSCULAR | Status: DC | PRN
  Administered 2017-12-17: 3800 [IU] via INTRAVENOUS_CENTRAL
  Filled 2017-12-17 (×2): qty 6

## 2017-12-17 MED ORDER — VANCOMYCIN HCL 10 G IV SOLR
1500.0000 mg | Freq: Once | INTRAVENOUS | Status: DC
  Filled 2017-12-17: qty 1500

## 2017-12-17 MED ORDER — ONDANSETRON HCL 4 MG PO TABS: 4.0000 mg | ORAL_TABLET | Freq: Four times a day (QID) | ORAL | Status: DC | PRN

## 2017-12-17 MED ORDER — SODIUM BICARBONATE 8.4 % IV SOLN
INTRAVENOUS | Status: AC
  Filled 2017-12-17: qty 50

## 2017-12-17 MED ORDER — NOREPINEPHRINE BITARTRATE 1 MG/ML IV SOLN
0.0000 ug/min | INTRAVENOUS | Status: DC
  Administered 2017-12-17: 4 ug/min via INTRAVENOUS
  Filled 2017-12-17: qty 4

## 2017-12-17 MED ORDER — ACETAMINOPHEN 325 MG PO TABS: 650.0000 mg | ORAL_TABLET | Freq: Four times a day (QID) | ORAL | Status: DC | PRN

## 2017-12-17 MED ORDER — FENTANYL CITRATE (PF) 100 MCG/2ML IJ SOLN
INTRAMUSCULAR | Status: AC
  Filled 2017-12-17: qty 2

## 2017-12-17 MED ORDER — DEXTROSE 5 % IV SOLN
0.5000 ug/min | INTRAVENOUS | Status: DC
  Filled 2017-12-17: qty 4

## 2017-12-17 MED ORDER — CHLORHEXIDINE GLUCONATE CLOTH 2 % EX PADS
6.0000 | MEDICATED_PAD | Freq: Every day | CUTANEOUS | Status: DC
  Administered 2017-12-17: 6 via TOPICAL

## 2017-12-17 MED ORDER — SODIUM CHLORIDE 0.9 % IV SOLN
3.0000 ug/kg/min | INTRAVENOUS | Status: DC
  Administered 2017-12-17: 4 ug/kg/min via INTRAVENOUS
  Filled 2017-12-17: qty 20

## 2017-12-17 MED ORDER — FENTANYL CITRATE (PF) 100 MCG/2ML IJ SOLN
100.0000 ug | Freq: Once | INTRAMUSCULAR | Status: AC
  Administered 2017-12-17: 100 ug via INTRAVENOUS

## 2017-12-17 MED ORDER — ACETAMINOPHEN 650 MG RE SUPP
650.0000 mg | Freq: Once | RECTAL | Status: AC
  Administered 2017-12-17: 650 mg via RECTAL
  Filled 2017-12-17: qty 1

## 2017-12-17 MED ORDER — ARTIFICIAL TEARS OPHTHALMIC OINT
1.0000 "application " | TOPICAL_OINTMENT | Freq: Three times a day (TID) | OPHTHALMIC | Status: DC
  Administered 2017-12-17: 1 via OPHTHALMIC
  Filled 2017-12-17: qty 3.5

## 2017-12-17 MED ORDER — SODIUM CHLORIDE 0.9 % IV BOLUS (SEPSIS)
2000.0000 mL | Freq: Once | INTRAVENOUS | Status: AC
  Administered 2017-12-17: 2000 mL via INTRAVENOUS

## 2017-12-17 MED ORDER — STERILE WATER FOR INJECTION IV SOLN
INTRAVENOUS | Status: DC
  Filled 2017-12-17 (×5): qty 150

## 2017-12-17 MED ORDER — ONDANSETRON HCL 4 MG/2ML IJ SOLN: 4.0000 mg | Freq: Four times a day (QID) | INTRAMUSCULAR | Status: DC | PRN

## 2017-12-17 MED ORDER — DARBEPOETIN ALFA 100 MCG/0.5ML IJ SOSY
100.0000 ug | PREFILLED_SYRINGE | INTRAMUSCULAR | Status: DC
  Filled 2017-12-17: qty 0.5

## 2017-12-17 MED ORDER — SODIUM BICARBONATE 8.4 % IV SOLN
INTRAVENOUS | Status: DC
  Administered 2017-12-17: 16:00:00 via INTRAVENOUS
  Filled 2017-12-17 (×3): qty 150

## 2017-12-17 MED ORDER — MIDAZOLAM HCL 2 MG/2ML IJ SOLN: 2.0000 mg | Freq: Once | INTRAMUSCULAR | Status: DC

## 2017-12-17 MED ORDER — SODIUM CHLORIDE 0.9 % IV BOLUS (SEPSIS)
250.0000 mL | Freq: Once | INTRAVENOUS | Status: AC
  Administered 2017-12-17: 250 mL via INTRAVENOUS

## 2017-12-17 MED ORDER — SODIUM BICARBONATE 8.4 % IV SOLN
50.0000 meq | Freq: Once | INTRAVENOUS | Status: AC
  Administered 2017-12-17: 50 meq via INTRAVENOUS

## 2017-12-17 MED ORDER — MIDAZOLAM BOLUS VIA INFUSION
2.0000 mg | INTRAVENOUS | Status: DC | PRN
  Filled 2017-12-17: qty 2

## 2017-12-17 MED ORDER — IPRATROPIUM-ALBUTEROL 0.5-2.5 (3) MG/3ML IN SOLN
3.0000 mL | Freq: Four times a day (QID) | RESPIRATORY_TRACT | Status: DC
  Administered 2017-12-17: 3 mL via RESPIRATORY_TRACT
  Filled 2017-12-17: qty 3

## 2017-12-17 MED ORDER — DEXTROSE 50 % IV SOLN
50.0000 mL | Freq: Once | INTRAVENOUS | Status: AC
  Administered 2017-12-17: 50 mL via INTRAVENOUS
  Filled 2017-12-17: qty 50

## 2017-12-17 MED ORDER — LIDOCAINE-PRILOCAINE 2.5-2.5 % EX CREA
1.0000 "application " | TOPICAL_CREAM | CUTANEOUS | Status: DC | PRN
  Filled 2017-12-17: qty 5

## 2017-12-17 MED ORDER — SODIUM CHLORIDE 0.9 % IV SOLN
100.0000 mL | INTRAVENOUS | Status: DC | PRN
Start: 2017-12-17 — End: 2017-12-17

## 2017-12-17 MED ORDER — FENTANYL CITRATE (PF) 100 MCG/2ML IJ SOLN: 100.0000 ug | Freq: Once | INTRAMUSCULAR | Status: DC

## 2017-12-17 MED ORDER — VANCOMYCIN HCL 10 G IV SOLR
1500.0000 mg | Freq: Once | INTRAVENOUS | Status: AC
  Administered 2017-12-17: 1500 mg via INTRAVENOUS
  Filled 2017-12-17: qty 1500

## 2017-12-17 MED ORDER — SODIUM BICARBONATE 8.4 % IV SOLN
INTRAVENOUS | Status: AC
  Administered 2017-12-17: 50 meq
  Filled 2017-12-17: qty 250

## 2017-12-17 MED ORDER — HEPARIN (PORCINE) 2000 UNITS/L FOR CRRT
INTRAVENOUS_CENTRAL | Status: DC | PRN
  Filled 2017-12-17: qty 1000

## 2017-12-17 MED ORDER — FENTANYL CITRATE (PF) 100 MCG/2ML IJ SOLN: 100.0000 ug | Freq: Once | INTRAMUSCULAR | Status: DC | PRN

## 2017-12-17 MED ORDER — VASOPRESSIN 20 UNIT/ML IV SOLN: 0.0300 [IU]/min | INTRAVENOUS | Status: DC

## 2017-12-17 MED ORDER — FAMOTIDINE 40 MG/5ML PO SUSR
20.0000 mg | Freq: Two times a day (BID) | ORAL | Status: DC
  Filled 2017-12-17 (×2): qty 2.5

## 2017-12-17 MED ORDER — PHENYLEPHRINE HCL 10 MG/ML IJ SOLN
0.0000 ug/min | INTRAMUSCULAR | Status: DC
  Administered 2017-12-17: 300 ug/min via INTRAVENOUS
  Filled 2017-12-17 (×3): qty 4

## 2017-12-17 MED ORDER — PRO-STAT SUGAR FREE PO LIQD
30.0000 mL | Freq: Two times a day (BID) | ORAL | Status: DC
  Filled 2017-12-17 (×2): qty 30

## 2017-12-17 MED ORDER — NITROGLYCERIN IN D5W 200-5 MCG/ML-% IV SOLN
0.0000 ug/min | Freq: Once | INTRAVENOUS | Status: AC
  Administered 2017-12-17: 10 ug/min via INTRAVENOUS

## 2017-12-17 MED ORDER — SODIUM CHLORIDE 0.9% FLUSH: 10.0000 mL | INTRAVENOUS | Status: DC | PRN

## 2017-12-17 MED ORDER — SODIUM CHLORIDE 0.9 % IV SOLN
2.0000 g | Freq: Once | INTRAVENOUS | Status: AC
  Administered 2017-12-17: 2 g via INTRAVENOUS
  Filled 2017-12-17: qty 2

## 2017-12-17 MED ORDER — VANCOMYCIN HCL IN DEXTROSE 1-5 GM/200ML-% IV SOLN: 1000.0000 mg | Freq: Once | INTRAVENOUS | Status: DC

## 2017-12-17 MED ORDER — ALTEPLASE 2 MG IJ SOLR: 2.0000 mg | Freq: Once | INTRAMUSCULAR | Status: DC | PRN

## 2017-12-17 MED ORDER — HEPARIN SODIUM (PORCINE) 1000 UNIT/ML DIALYSIS
1000.0000 [IU] | INTRAMUSCULAR | Status: DC | PRN
  Filled 2017-12-17 (×4): qty 1

## 2017-12-17 MED ORDER — IPRATROPIUM-ALBUTEROL 0.5-2.5 (3) MG/3ML IN SOLN: 3.0000 mL | Freq: Four times a day (QID) | RESPIRATORY_TRACT | Status: DC

## 2017-12-17 MED ORDER — MIDAZOLAM HCL 2 MG/2ML IJ SOLN: 2.0000 mg | INTRAMUSCULAR | Status: DC | PRN

## 2017-12-17 MED ORDER — SODIUM CHLORIDE 0.9 % IV BOLUS (SEPSIS)
500.0000 mL | Freq: Once | INTRAVENOUS | Status: AC
  Administered 2017-12-17: 500 mL via INTRAVENOUS

## 2017-12-17 MED ORDER — SODIUM CHLORIDE 0.9 % IV SOLN
2.0000 mg/h | INTRAVENOUS | Status: DC
  Administered 2017-12-17: 2 mg/h via INTRAVENOUS
  Filled 2017-12-17: qty 10

## 2017-12-17 MED ORDER — SODIUM CHLORIDE 0.9 % IV SOLN
2.0000 g | INTRAVENOUS | Status: DC
  Filled 2017-12-17: qty 2

## 2017-12-17 MED ORDER — VITAL HIGH PROTEIN PO LIQD: 1000.0000 mL | ORAL | Status: DC

## 2017-12-17 MED ORDER — DOXERCALCIFEROL 4 MCG/2ML IV SOLN
1.0000 ug | INTRAVENOUS | Status: DC
  Filled 2017-12-17: qty 2

## 2017-12-17 MED ORDER — FENTANYL BOLUS VIA INFUSION
50.0000 ug | INTRAVENOUS | Status: DC | PRN
  Filled 2017-12-17: qty 50

## 2017-12-17 MED ORDER — HEPARIN BOLUS VIA INFUSION (CRRT)
1000.0000 [IU] | INTRAVENOUS | Status: DC | PRN
  Filled 2017-12-17: qty 1000

## 2017-12-17 MED ORDER — STERILE WATER FOR INJECTION IV SOLN
INTRAVENOUS | Status: DC
  Filled 2017-12-17 (×7): qty 150

## 2017-12-17 MED ORDER — LORAZEPAM 2 MG/ML IJ SOLN
0.5000 mg | Freq: Once | INTRAMUSCULAR | Status: AC
  Administered 2017-12-17: 0.5 mg via INTRAVENOUS

## 2017-12-17 MED ORDER — FENTANYL 2500MCG IN NS 250ML (10MCG/ML) PREMIX INFUSION
100.0000 ug/h | INTRAVENOUS | Status: DC
  Administered 2017-12-17: 300 ug/h via INTRAVENOUS
  Filled 2017-12-17: qty 250

## 2017-12-18 MED FILL — Medication: Qty: 1 | Status: AC

## 2017-12-19 ENCOUNTER — Telehealth: Payer: Self-pay

## 2017-12-19 LAB — CULTURE, BLOOD (ROUTINE X 2): SPECIAL REQUESTS: ADEQUATE

## 2017-12-19 MED FILL — Medication: Qty: 1 | Status: AC

## 2017-12-19 NOTE — Telephone Encounter (Signed)
On 12/19/17 I received a d/c from [facility] (original). The d/c is for (unknown). The patient is a patient of Doctor Nelda Marseille. The d/c will be take to Ethan @ [facility] for signature.  On 12/23/17 I received the d/c back from Doctor Nelda Marseille. I got the d/c ready and called the funeral home to let them know the d/c is ready for pickup. I also faxed a copy to the funeral home per the funeral home request.

## 2017-12-20 LAB — CULTURE, BLOOD (ROUTINE X 2): Special Requests: ADEQUATE

## 2018-01-05 NOTE — Progress Notes (Signed)
ABG results provided to [facility] NP Brooke.

## 2018-01-05 NOTE — Progress Notes (Addendum)
PCCM Interval Note  Called to bedside reference to hemodynamic instability in the setting of refractory acidosis and severe septic shock.  Patient is currently maxed out on bicarb gtt, levophed, norepi and vasopressin gtts.  Ventilator settings additionally at maximum setting to adjust for acidosis.  BP continues to drop after bicarb pushes every 10 mins.  Nephrology is aware and is coming to bedside, however, prognosis appears to be grim as she is not responding to aggressive medical therapy and likely will not survive.  Husband updated by Dr. Nelda Marseille and currently arrived at bedside. Ongoing support provided and patient remains a full code at this time, as husband wants everything done, despite ongoing resuscitation efforts.    Kennieth Rad, AGACNP-BC [place] [facility] Pgr: [phone] or if no answer [phone] January 03, 2018, 6:09 PM  Attending Note:  Came back to evaluate patient and examine.  Even with increase in bicarb drip and advancing mechanical ventilation acidosis was getting worse to the point of compromising circulation and efficacy of pressors.  I spoke with the husband twice over the phone and now upon arrival.  Informed him that patient has no reasonable chance at survival here and that even with consulting with the renal service the feeling is that CRRT with current hemodynamics on 60 of levo, 400 of neo and 0.03 of vaso will likely result in more harm than good.  Unlikely to survive the dialysis process even.    Husband is understandably upset and relays that the patient's mother is currently in [place] and that they need 2 days for her to get here.  I informed him that with current hemodynamics we are unlikely to have even hours.  He is very reluctant to discuss comfort measures and withdrawal of pressor support but was agreeable to DNR with no further escalation of care.  Will make patient DNR, reduce levophed and neo to normal max of 40 and 200 respectively and continue  vaso.  If patient is to expire then will not interfere but will continue fentanyl and versed to maintain comfort.  If family wishes to stop vasopressors upon arrival I would support that but if they wish to continue current levels we will not longer increase pressors.  We will also not add any additional bicarb beyond the current drip ordered.  The patient is critically ill with multiple organ systems failure and requires high complexity decision making for assessment and support, frequent evaluation and titration of therapies, application of advanced monitoring technologies and extensive interpretation of multiple databases.   Critical Care Time devoted to patient care services described in this note is  75  Minutes. This time reflects time of care of this signee Dr [name]. This critical care time does not reflect procedure time, or teaching time or supervisory time of PA/NP/Med student/Med Resident etc but could involve care discussion time.  Rush Farmer, M.D. [facility] Pulmonary/Critical Care Medicine. Pager: [phone]. After hours pager: [phone].

## 2018-01-05 NOTE — ED Notes (Signed)
Code sepsis activated, RN Belenda Cruise aware

## 2018-01-05 NOTE — Consult Note (Addendum)
PULMONARY / CRITICAL CARE MEDICINE   Name: [name] MRN: [id number] DOB: [birth year]    ADMISSION DATE:  December 22, 2017 CONSULTATION DATE:  22-Dec-2017  REFERRING MD:  Dr. Sherral Hammers / [facility]   CHIEF COMPLAINT:  Cardiac Arrest   HISTORY OF PRESENT ILLNESS:  42 y/o F who presented to [facility] ER on 3/13 with reports of shortness of breath.  The patient was brought in by EMS with reports of SOB x2 days.  She was found to have saturations in the 60's.  She had increased work of breathing and was placed on BiPAP.  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  The patient is reportedly faithful with HD (M,W,F) and actually comes in for extra treatments during the week to allow her to eat / drink more.  However, in the past she has left and gone back to [place] for extended periods of time.   She was stabilized in the ER and admitted per [facility].  Initial labs - 7.246 / 18.2 / 152 / 7.8, Na 132, K 4.0, cl 95, CO2 9, glucose 88, BUN 79, Sr Cr 7.76, Ca 8.2, troponin 0.12, lactic acid 2.07, PCT > 150, WBC 71.9, Hgb 12 and platelet 342.  PCXR showed RLL infiltrate, HD cath in place.    On 3/13 the patient was transferred to HD before going to [facility] bed.  While in HD, she became less responsive, had a change in color and bradycardia.  Patient was checked and unable to palpate pulse.  CPR / ACLS initiated.  She required 2 minutes of CPR with ROSC (1 epi).  The patient was intubated She reportedly received a full round of HD with approximately 2L removed.  The patient was transferred to ICU for further care.   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  PAST SURGICAL HISTORY: She  has a past surgical history that includes Tumor removal (2005); AV fistula placement (06/03/2012); Eye surgery (Right); Combined kidney-pancreas transplant (2010); Pancreatectomy (12/13/2010); Renal biopsy (12/13/2010; 10/25/2011); TEE without cardioversion (N/A, 04/22/2014); Bascilic vein transposition (Right, 03/06/2016); Revison of arteriovenous fistula (Right, 08/19/2017); Insertion of dialysis catheter (Left, 08/19/2017); venogram (Left, 08/19/2017); and AV fistula placement (Left, 09/18/2017).  Allergies  Allergen Reactions  . Ace Inhibitors Cough  . Other Rash    DERMABOND     No current facility-administered medications on file prior to encounter.    Current Outpatient Medications on File Prior to Encounter  Medication Sig  . amLODipine (NORVASC) 10 MG tablet Take 10 mg by mouth at bedtime.   [name] aspirin EC 81 MG tablet Take 81 mg by mouth at bedtime.   . B Complex-C-Folic Acid (RENA-VITE PO) Take 1 tablet by mouth at bedtime.   . calcium acetate (PHOSLO) 667 MG capsule Take 3 capsules (2,001 mg total) by mouth 3 (three) times daily with meals.  . cloNIDine (CATAPRES) 0.1 MG tablet Take 0.1 mg by mouth 2 (two) times daily.   . diphenhydrAMINE (BENADRYL) 25 mg capsule Take 25 mg by mouth daily as needed for allergies.  [name] escitalopram (  LEXAPRO) 20 MG tablet Take 10 mg by mouth daily.   . insulin aspart (NOVOLOG) 100 UNIT/ML injection Inject 16 Units into the skin 2 (two) times daily with breakfast and lunch.   . insulin glargine (LANTUS) 100 UNIT/ML injection Inject 0.5 mLs (50 Units total) into the skin at bedtime. (Patient taking differently: Inject 32 Units into the skin at bedtime. )  . labetalol (NORMODYNE) 300 MG tablet Take 300 mg by mouth 2 (two) times daily.  [name] oxyCODONE-acetaminophen (ROXICET) 5-325  MG tablet Take 1 tablet by mouth every 4 (four) hours as needed for moderate pain.  . sucroferric oxyhydroxide (VELPHORO) 500 MG chewable tablet Chew 1,000 mg by mouth 3 (three) times daily with meals.    FAMILY HISTORY:  Her indicated that her mother is alive. She indicated that her father is deceased.   SOCIAL HISTORY: She  reports that  has never smoked. she has never used smokeless tobacco. She reports that she does not drink alcohol or use drugs.  REVIEW OF SYSTEMS:  Unable to complete as patient is altered on mechanical ventilation.   SUBJECTIVE:  RRT RN reports pt was in HD waiting on 2C bed.  Noted to become altered, change in color. Assessed and pulseless > CPR initiated.   VITAL SIGNS: BP (P) 114/66   Pulse (!) (P) 143   Temp 97.8 F (36.6 C) (Axillary)   Resp (!) (P) 39   SpO2 (P) 98%   HEMODYNAMICS:    VENTILATOR SETTINGS: Vent Mode: BIPAP;PCV FiO2 (%):  [40 %-60 %] (P) 40 % Set Rate:  [15 bmp] 15 bmp PEEP:  [4 cmH20] 4 cmH20  INTAKE / OUTPUT: No intake/output data recorded.  PHYSICAL EXAMINATION: General: critically ill appearing female on vent HEENT: MM pink/moist, ETT, L IJ permcath  Neuro: obtunded post arrest  CV: s1s2 rrr, no m/r/g PULM: even/non-labored, lungs bilaterally with coarse rhonchi  QI:ONGE, non-tender, bsx4 active  Extremities: warm/dry, no overt edema  Skin: dry, multiple scattered circular lesions on skin of varying stages  LABS:  BMET Recent Labs  Lab 01/07/2018 0100 01/07/18 0107 01-07-18 0443  NA 131* 128* 132*  K 4.1 4.0 4.0  CL 87* 92* 95*  CO2 9*  --  9*  BUN 79* 69* 79*  CREATININE 7.98* 7.70* 7.76*  GLUCOSE 61* 63* 88    Electrolytes Recent Labs  Lab Jan 07, 2018 0100 Jan 07, 2018 0443  CALCIUM 8.9 8.2*    CBC Recent Labs  Lab 2018/01/07 0100 01-07-18 0107 01-07-18 0443  WBC 71.9*  --  66.2*  HGB 12.0 13.6 9.7*  HCT 35.3* 40.0 28.5*  PLT 342  --  272    Coag's Recent Labs  Lab 07-Jan-2018 0100  INR 1.17     Sepsis Markers Recent Labs  Lab 01/07/2018 0120 2018-01-07 0443 2018-01-07 0449  LATICACIDVEN 3.71* 1.9 2.07*  PROCALCITON  --  >150.00  --     ABG Recent Labs  Lab January 07, 2018 0140  PHART 7.246*  PCO2ART 18.2*  PO2ART 152.0*    Liver Enzymes No results for input(s): AST, ALT, ALKPHOS, BILITOT, ALBUMIN in the last 168 hours.  Cardiac Enzymes Recent Labs  Lab 07-Jan-2018 0100 2018/01/07 0443  TROPONINI 0.09* 0.12*    Glucose Recent Labs  Lab Jan 07, 2018 0202 January 07, 2018 0228 07-Jan-2018 0356 January 07, 2018 0427 01/07/2018 0557  GLUCAP 38* 131* 98 83 60*    Imaging Dg Chest Port 1 View  Result Date: 01/07/18 CLINICAL DATA:  Acute onset of difficulty breathing. Decreased O2 saturation. EXAM: PORTABLE CHEST  1 VIEW COMPARISON:  Chest radiograph performed 12/17/2015 FINDINGS: Right basilar airspace opacity is concerning for pneumonia. No pleural effusion or pneumothorax is seen. The cardiomediastinal silhouette is borderline normal in size. A left-sided dual-lumen catheter is noted ending at the right atrium. No acute osseous abnormalities are identified. IMPRESSION: Right basilar airspace opacity is concerning for pneumonia. Followup PA and lateral chest X-ray is recommended in 3-4 weeks following trial of antibiotic therapy to ensure resolution and exclude underlying malignancy. Electronically Signed   By: Garald Balding M.D.   On: 01/05/18 01:27     STUDIES:  ECHO 3/13 >>   CULTURES: BCx2 3/12 >>  C-Diff 3/13 >>  Tracheal aspirate 3/13 >>  U. Strep 3/13 >>  U. Legionella 3/13 >>  Influenza 3/12 >> flu A positive   ANTIBIOTICS: Cefepime 3/12 >>  Vanco 3/12 >>   SIGNIFICANT EVENTS: 3/12  Admit with SOB, hypoxia.  Flu A positive, RLL PNA  LINES/TUBES: L IJ Perm Cath >>  ETT 3/13 >>   DISCUSSION: 42 y/o F with ESRD on HD, DM admitted with SOB and hypoxia.  Work up positive for influenza A + RLL PNA.  Significantly elevated WBC. R/O C-Diff.   ASSESSMENT /  PLAN:  PULMONARY A: Acute Hypoxic Respiratory Failure - in setting of influenza A, RLL PNA  RLL PNA  Influenza A Positive Hx Pulmonary TB - 2003 & 2007, treated P:   PRVC 8cc/kg  Wean PEEP / FiO2 for sats >90% Now CXR post intubation Follow up ABG post intubation  See ID  CARDIOVASCULAR A:  Cardiac Arrest - bradycardia, brief with ~ 2 min CPR  Septic Shock  Hx HTN P:  ICU monitoring  Not a candidate for TTM due to influenza A, brief arrest  May need stress dose steroids given hypotension / vasopressors Neosynephrine for MAP > 65  Trend Troponin  Assess ECHO for changes in LVEF as cause of arrest   RENAL A:   ESRD on HD (M/W/F)  Hyponatremia  AG Metabolic Acidosis  P:   Trend BMP   Replace electrolytes as indicated Now bicarb push then D5w with 3 amps bicarb at 184ml/hr  Appreciate Nephrology input   GASTROINTESTINAL A:   NPO  P:   NPO / OGT  Begin TF per Nutrition with hypoglycemia  Pepcid for SUP   HEMATOLOGIC A:   Leukocytosis - admit WBC 71k Anemia  P:  Trend CBC  Heparin for DVT prophylaxis  Monitor for bleeding   INFECTIOUS A:   Profound Leukocytosis  Septic Shock secondary to Influenza, PNA  R/O C-Diff  P:   Assess cultures as above  Trend WBC, fever curve Empiric abx as above, narrow as able  Trend PCT   ENDOCRINE A:   DM  Hypoglycemia - CBG unable to read post arrest  P:   Now 2 amps glucose (first given, remained hypoglycemic) Dextrose as base of IVF TF as above for hypoglycemia  Monitor CBG Q1 until glucose > 100, then Q4   NEUROLOGIC A:   Acute Metabolic Encephalopathy - post arrest, profound hypoglycemia, ESRD / acidosis  P:   RASS goal: 0 to -1  PRN Fentanyl / versed for sedation  Supportive care Assess serial neuro exams   FAMILY  - Updates: Family updated per Dr. Nelda Marseille 3/13.    - Inter-disciplinary family meet or Palliative Care meeting due by: 3/19   CC Time: 74 minutes   Noe Gens, NP-C [place]  Pulmonary & Critical Care Pgr: [phone] or  if no answer [phone] 01/02/18, 2:00 PM  Attending Note:  42 year old female with PMH of ESRD due DM and failed renal transplant that presented to the ED with SOB and was noted to have not finished her previous dialysis.  Patient was taken to the inpatient dialysis unit and 75% of the way into her dialysis treatment and after the removal of 2L she became obtunded and developed PEA.  Code <2 minutes with ROSC after that.  On exam, she is in agonal respiration with diffuse coarse BS diffusely.  I reviewed CXR myself, RLL infiltrate noted.  One with ETT in place is still pending.  WBC of 71.9K and low grade fever.  Will maintain full vent support.  Adjust vent for PCV given dyssynchrony.  TF per nutrition.  Tamiflu renally adjusted.  Vanc/cefepime ordered.  ?C diff colitis.  Neo for BP support.  AM labs ordered.  If remains acidotic in AM then will consider CRRT.  Renal service notified.  PCCM will assume care.  The patient is critically ill with multiple organ systems failure and requires high complexity decision making for assessment and support, frequent evaluation and titration of therapies, application of advanced monitoring technologies and extensive interpretation of multiple databases.   Critical Care Time devoted to patient care services described in this note is  105  Minutes. This time reflects time of care of this signee Dr [name]. This critical care time does not reflect procedure time, or teaching time or supervisory time of PA/NP/Med student/Med Resident etc but could involve care discussion time.  Rush Farmer, M.D. [facility] Pulmonary/Critical Care Medicine. Pager: [phone]. After hours pager: [phone].

## 2018-01-05 NOTE — Progress Notes (Signed)
CRITICAL VALUE ALERT  Critical Value:  WBC 70.8   Date & Time Notied:  12/25/2017 1420  Provider Notified: Dr. Nelda Marseille   Orders Received/Actions taken: no orders given at this

## 2018-01-05 NOTE — Anesthesia Procedure Notes (Signed)
Procedure Name: Intubation Date/Time: 12-25-2017 1:25 PM Performed by: Myna Bright, CRNA Pre-anesthesia Checklist: Patient identified, Emergency Drugs available, Suction available and Patient being monitored Oxygen Delivery Method: Ambu bag Preoxygenation: Pre-oxygenation with 100% oxygen Ventilation: Mask ventilation without difficulty Laryngoscope Size: Glidescope and 3 Tube type: Subglottic suction tube Tube size: 7.5 mm Number of attempts: 1 Airway Equipment and Method: Video-laryngoscopy and Stylet Placement Confirmation: ETT inserted through vocal cords under direct vision,  positive ETCO2 and breath sounds checked- equal and bilateral Secured at: 23 cm Tube secured with: Tape Dental Injury: Teeth and Oropharynx as per pre-operative assessment  Comments: Responded to code blue page in hemodialysis unit. On arrival patient had ROSC, but remained unresponsive with agonal respirations. RT managing airway with ambu bag and stated they attempted to intubate x 1 unsuccessfully. Used Glidescope, good view with Glide. Atraumatic oral intubation. Care deferred to code team.

## 2018-01-05 NOTE — Progress Notes (Addendum)
Pharmacy Antibiotic Note  [name] is a 42 y.o. female admitted on 05-Jan-2018 with pneumonia.  Pharmacy has been consulted for Vancomycin/Cefepime dosing. WBC is markedly elevated at 71.9. ESRD on HD MWF. Pt nodded "yes" when asked if on anti-biotics at HD so we checked a random vancomycin level which came back as undetectable>>will proceed with vancomycin initiation.   Plan: Vancomycin 1500 mg IV x 1, then 750 mg IV qHD MWF Cefepime 2g IV x 1, then 2g IV q2000 on HD days (MWF) Trend WBC, temp, HD schedule F/U infectious work-up Drug levels as indicated  Temp (24hrs), Avg:99.9 F (37.7 C), Min:99.2 F (37.3 C), Max:100.5 F (38.1 C)  Recent Labs  Lab Jan 05, 2018 0100 January 05, 2018 0107 2018/01/05 0120 2018/01/05 0204  WBC 71.9*  --   --   --   CREATININE 7.98* 7.70*  --   --   LATICACIDVEN  --   --  3.71*  --   VANCORANDOM  --   --   --  <4    CrCl cannot be calculated (Unknown ideal weight.).    Allergies  Allergen Reactions  . Ace Inhibitors Cough  . Other Rash    DERMABOND     Narda Bonds 01-05-18 2:50 AM

## 2018-01-05 NOTE — ED Provider Notes (Signed)
[facility] EMERGENCY DEPARTMENT Provider Note   CSN: [id number] Arrival date & time: 01-13-2018  0054     History   Chief Complaint Chief Complaint  Patient presents with  . Respiratory Distress   Level 5 caveat due to acuity of condition HPI [name] is a 42 y.o. female.  The history is provided by the EMS personnel. The history is limited by the condition of the patient.  Shortness of Breath  This is a new problem. The problem occurs continuously.The problem has been rapidly worsening. Treatments tried: CPAP. The treatment provided mild relief. She has had prior hospitalizations.  Patient with history of renal failure on dialysis, history of K/P transplant since with acute shortness of breath.  Per EMS they were called out due to shortness of breath, on their arrival patient was tripoding and gasping for breath.  Her initial pulse ox of 64% on nonrebreather.  She was transitioned to CPAP and her O2 sats improved to 85%.  Patient has been in distress.  She goes to dialysis Monday/Wednesday/Friday, she has not missed a session No other details known at this time. Past Medical History:  Diagnosis Date  . Anemia   . ARF (acute respiratory failure) ([place]) 04/11/2015   with hypoxia  . CAP (community acquired pneumonia) 2010  . Diabetic retinopathy ([place])   . ESRD on hemodialysis ([place]) 08/25/2013   Started HD in 2008.  Had [place] transplant in 2010 at [facility].  Went back on HD in 2012.  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  The kidney was left in.  She now gets HD at [facility] on TTS schedule.     [name] History of blood transfusion    "lots of times" (01/27/2013)  . 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  Both organs have since rejected, see "ESRD" for details.   [name] HTN (hypertension)    not well controlled  . On home oxygen therapy    "5L; nightttime" (12/18/2015), no longer on O2 at bedtime as  of 08/18/17  . Ovarian tumor, malignant ([place]) 2005   resected in [place]' pt denies that this was cancer on 01/27/2013  . TB (pulmonary tuberculosis) 2003, 2007   history of miliary TB partially treated in 2003, and then completely treated in 2007  . Type II diabetes mellitus [facility])     Patient Active Problem List   Diagnosis Date Noted  . Steal syndrome dialysis vascular access ([place]) 08/15/2017  . Gangrene of finger of right hand ([place]) 08/15/2017  . ESRD on dialysis ([place]) 02/27/2016  . ESRD (end stage renal disease) on dialysis ([place])   . Flash pulmonary edema ([place])   . Acute on chronic respiratory failure with hypoxia ([place])   . Diabetes type 2, controlled ([place])   . Diabetic gastroparesis ([place])   . DM retinopathy ([place])   . Noncompliance w/medication treatment due to intermit use of medication   . Acute encephalopathy   . Metabolic bone disease   . Diabetes mellitus with multiple complications ([place]) 48/18/5631  . Hypertensive urgency 04/11/2015  . Sepsis ([place]) 04/11/2015  . Acute hyperkalemia   . Acute respiratory failure ([place])   . Cardiac arrest ([place]) 10/31/2014  . Respiratory failure ([place]) 10/31/2014  . Anoxic encephalopathy ([place]) 10/31/2014  . Cardiogenic shock ([place]) 10/31/2014  . Staphylococcus aureus bacteremia 04/20/2014  . Hyponatremia 04/19/2014  . History of simultaneous kidney and pancreas transplant ([place])  04/19/2014  . Hyperkalemia 04/18/2014  . Symptomatic anemia 01/24/2014  . ESRD on hemodialysis ([place]) 08/25/2013  . Ovarian tumor, malignant ([place])   . Anemia of renal disease 01/21/2012  . DM (diabetes mellitus) ([place]) 01/21/2012  . 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 HYPERLIPIDEMIA 10/10/2008  . Essential hypertension 10/10/2008    Past Surgical History:  Procedure Laterality Date  . AV FISTULA PLACEMENT  06/03/2012   Procedure: INSERTION OF ARTERIOVENOUS (AV) GORE-TEX GRAFT ARM;  Surgeon: Rosetta Posner, MD;  Location: [facility] OR;  Service: Vascular;   Laterality: Left;  . AV FISTULA PLACEMENT Left 09/18/2017   Procedure: INSERTION OF ARTERIOVENOUS (AV) GORE-TEX GRAFT LEFT UPPER ARM;  Surgeon: Conrad Adams, MD;  Location: [facility];  Service: Vascular;  Laterality: Left;  . BASCILIC VEIN TRANSPOSITION Right 03/06/2016   Procedure: RIGHT ARM BASILIC VEIN TRANSPOSITION;  Surgeon: Mal Misty, MD;  Location: [facility];  Service: Vascular;  Laterality: Right;  . COMBINED KIDNEY-PANCREAS TRANSPLANT  2010   Archie Endo 01/27/2013; @ [facility] Right    "cause of diabetes" (4/23/20140  . INSERTION OF DIALYSIS CATHETER Left 08/19/2017   Procedure: INSERTION OFTUNNELED  DIALYSIS CATHETER;  Surgeon: Conrad Roanoke, MD;  Location: [facility];  Service: Vascular;  Laterality: Left;  . PANCREATECTOMY  12/13/2010   for acute and chronic pancreatitis with abcess formation/notes 01/27/2013  . RENAL BIOPSY  12/13/2010; 10/25/2011   Archie Endo 01/27/2013  . REVISON OF ARTERIOVENOUS FISTULA Right 08/19/2017   Procedure: LIGATion RIGHT BASILIC VEIN TRANSPOSITION;  Surgeon: Conrad [place], MD;  Location: [facility];  Service: Vascular;  Laterality: Right;  . TEE WITHOUT CARDIOVERSION N/A 04/22/2014   Procedure: TRANSESOPHAGEAL ECHOCARDIOGRAM (TEE);  Surgeon: Fay Records, MD;  Location: [facility] ENDOSCOPY;  Service: Cardiovascular;  Laterality: N/AHinton [place]  . TUMOR REMOVAL  2005   Ovarian, resected in [place]  . VENOGRAM Left 08/19/2017   Procedure: Left arm and Central VENOGRAM;  Surgeon: Conrad [place] Pal[place], MD;  Location: [facility] OR;  Service: Vascular;  Laterality: Left;    OB History    No data available       Home Medications    Prior to Admission medications   Medication Sig Start Date End Date Taking? Authorizing Provider  amLODipine (NORVASC) 10 MG tablet Take 10 mg by mouth at bedtime.     [provider]  aspirin EC 81 MG tablet Take 81 mg by mouth at bedtime.     [provider]  B Complex-C-Folic Acid (RENA-VITE PO) Take 1 tablet by mouth at bedtime.      [provider]  calcium acetate (PHOSLO) 667 MG capsule Take 3 capsules (2,001 mg total) by mouth 3 (three) times daily with meals. 01/28/13   Alric Seton, PA-C  cloNIDine (CATAPRES) 0.1 MG tablet Take 0.1 mg by mouth 2 (two) times daily.     [provider]  diphenhydrAMINE (BENADRYL) 25 mg capsule Take 25 mg by mouth daily as needed for allergies.    [provider]  escitalopram (LEXAPRO) 20 MG tablet Take 10 mg by mouth daily.     [provider]  insulin aspart (NOVOLOG) 100 UNIT/ML injection Inject 16 Units into the skin 2 (two) times daily with breakfast and lunch.     [provider]  insulin glargine (LANTUS) 100 UNIT/ML injection Inject 0.5 mLs (50 Units total) into the skin at bedtime. Patient taking differently: Inject 32 Units into the skin at bedtime.  04/23/14  Delfina Redwood, MD  labetalol (NORMODYNE) 300 MG tablet Take 300 mg by mouth 2 (two) times daily.    [provider]  oxyCODONE-acetaminophen (ROXICET) 5-325 MG tablet Take 1 tablet by mouth every 4 (four) hours as needed for moderate pain. 09/18/17   Ulyses Amor, PA-C  sucroferric oxyhydroxide (VELPHORO) 500 MG chewable tablet Chew 1,000 mg by mouth 3 (three) times daily with meals.    [provider]    Family History Family History  Problem Relation Age of Onset  . Diabetes Mother   . Liver cancer Father   . Cancer Father     Social History Social History   Tobacco Use  . Smoking status: Never Smoker  . Smokeless tobacco: Never Used  Substance Use Topics  . Alcohol use: No  . Drug use: No     Allergies   Ace inhibitors and Other   Review of Systems Review of Systems  Unable to perform ROS: Acuity of condition  Respiratory: Positive for shortness of breath.      Physical Exam Updated Vital Signs BP (!) 168/126 (BP Location: Left Arm)   Pulse (!) 141   Resp (!) 34   SpO2 (!) 84%   Physical Exam CONSTITUTIONAL:  Ill-appearing, distress noted HEAD: Normocephalic/atraumatic EYES: EOMI/PERRL ENMT: Mucous membranes moist mask in place NECK: supple no meningeal signs SPINE/BACK:entire spine nontender CV: S1/S2 noted, tachycardic LUNGS: Tachypnea, respiratory distress, crackles bilaterally ABDOMEN: soft GU:no cva tenderness NEURO: Pt is awake/alert, moves all extremities x4 EXTREMITIES: pulses normal/equal, full ROM SKIN: warm, color normal, dialysis access catheter and left chest PSYCH: Anxious ED Treatments / Results  Labs (all labs ordered are listed, but only abnormal results are displayed) Labs Reviewed  BASIC METABOLIC PANEL - Abnormal; Notable for the following components:      Result Value   Sodium 131 (*)    Chloride 87 (*)    CO2 9 (*)    Glucose, Bld 61 (*)    BUN 79 (*)    Creatinine, Ser 7.98 (*)    GFR calc non Af Amer 6 (*)    GFR calc Af Amer 6 (*)    Anion gap 35 (*)    All other components within normal limits  CBC WITH DIFFERENTIAL/PLATELET - Abnormal; Notable for the following components:   WBC 71.9 (*)    RBC 3.68 (*)    HCT 35.3 (*)    RDW 15.9 (*)    Neutro Abs 64.7 (*)    Monocytes Absolute 4.3 (*)    All other components within normal limits  BRAIN NATRIURETIC PEPTIDE - Abnormal; Notable for the following components:   B Natriuretic Peptide >4,500.0 (*)    All other components within normal limits  TROPONIN I - Abnormal; Notable for the following components:   Troponin I 0.09 (*)    All other components within normal limits  I-STAT CHEM 8, ED - Abnormal; Notable for the following components:   Sodium 128 (*)    Chloride 92 (*)    BUN 69 (*)    Creatinine, Ser 7.70 (*)    Glucose, Bld 63 (*)    Calcium, Ion 0.99 (*)    TCO2 11 (*)    All other components within normal limits  I-STAT CG4 LACTIC ACID, ED - Abnormal; Notable for the following components:   Lactic Acid, Venous 3.71 (*)    All other components within normal limits  I-STAT ARTERIAL BLOOD  GAS, ED - Abnormal; Notable for  the following components:   pH, Arterial 7.246 (*)    pCO2 arterial 18.2 (*)    pO2, Arterial 152.0 (*)    Bicarbonate 7.8 (*)    TCO2 8 (*)    Acid-base deficit 17.0 (*)    All other components within normal limits  CBG MONITORING, ED - Abnormal; Notable for the following components:   Glucose-Capillary 38 (*)    All other components within normal limits  CBG MONITORING, ED - Abnormal; Notable for the following components:   Glucose-Capillary 131 (*)    All other components within normal limits  CULTURE, BLOOD (ROUTINE X 2)  CULTURE, BLOOD (ROUTINE X 2)  PROTIME-INR  VANCOMYCIN, RANDOM  INFLUENZA PANEL BY PCR (TYPE A & B)  I-STAT CG4 LACTIC ACID, ED    EKG  EKG Interpretation  Date/Time:  2018-01-05 01:13:02 EDT Ventricular Rate:  134 PR Interval:    QRS Duration: 97 QT Interval:  300 QTC Calculation: 448 R Axis:   77 Text Interpretation:  Sinus tachycardia Repol abnrm suggests ischemia, lateral leads Abnormal ekg Confirmed by Ripley Fraise ([id number]) on Jan 05, 2018 1:17:47 AM       Radiology Dg Chest Port 1 View  Result Date: Jan 05, 2018 CLINICAL DATA:  Acute onset of difficulty breathing. Decreased O2 saturation. EXAM: PORTABLE CHEST 1 VIEW COMPARISON:  Chest radiograph performed 12/17/2015 FINDINGS: Right basilar airspace opacity is concerning for pneumonia. No pleural effusion or pneumothorax is seen. The cardiomediastinal silhouette is borderline normal in size. A left-sided dual-lumen catheter is noted ending at the right atrium. No acute osseous abnormalities are identified. IMPRESSION: Right basilar airspace opacity is concerning for pneumonia. Followup PA and lateral chest X-ray is recommended in 3-4 weeks following trial of antibiotic therapy to ensure resolution and exclude underlying malignancy. Electronically Signed   By: Garald Balding M.D.   On: 2018-01-05 01:27    Procedures Procedures  CRITICAL CARE Performed  by: Sharyon Cable Total critical care time: 45 minutes Critical care time was exclusive of separately billable procedures and treating other patients. Critical care was necessary to treat or prevent imminent or life-threatening deterioration. Critical care was time spent personally by me on the following activities: development of treatment plan with patient and/or surrogate as well as nursing, discussions with consultants, evaluation of patient's response to treatment, examination of patient, obtaining history from patient or surrogate, ordering and performing treatments and interventions, ordering and review of laboratory studies, ordering and review of radiographic studies, pulse oximetry and re-evaluation of patient's condition. Patient with respiratory failure requiring BiPAP, also noted to be septic from pneumonia, requiring IV fluids and IV antibiotics. Medications Ordered in ED Medications  sodium chloride 0.9 % bolus 1,000 mL (1,000 mLs Intravenous New Bag/Given Jan 05, 2018 0220)    And  sodium chloride 0.9 % bolus 1,000 mL (not administered)    And  sodium chloride 0.9 % bolus 250 mL (not administered)  vancomycin (VANCOCIN) 1,500 mg in sodium chloride 0.9 % 500 mL IVPB (1,500 mg Intravenous New Bag/Given 2018/01/05 0257)  ceFEPIme (MAXIPIME) 2 g in sodium chloride 0.9 % 100 mL IVPB (not administered)  vancomycin (VANCOCIN) IVPB 750 mg/150 ml premix (not administered)  nitroGLYCERIN 50 mg in dextrose 5 % 250 mL (0.2 mg/mL) infusion (0 mcg/min Intravenous Stopped 2018-01-05 0137)  ceFEPIme (MAXIPIME) 2 g in sodium chloride 0.9 % 100 mL IVPB (0 g Intravenous Stopped 2018/01/05 0253)  acetaminophen (TYLENOL) suppository 650 mg (650 mg Rectal Given 05-Jan-2018 0139)  LORazepam (ATIVAN) injection 0.5 mg (  0.5 mg Intravenous Given 12/24/17 0152)  dextrose 50 % solution (50 mLs  Given 2017-12-24 0207)     Initial Impression / Assessment and Plan / ED Course  I have reviewed the triage vital signs and the  nursing notes.  Pertinent labs & imaging results that were available during my care of the patient were reviewed by me and considered in my medical decision making (see chart for details).     1:13 AM Patient seen on arrival due to respiratory distress.  We transitioned her to BiPAP, suspicion for acute hypertensive emergency/pulmonary edema is high.  IV nitroglycerin has been ordered.  We will follow closely 1:40 AM Initial concern for hypertensive emergency, patient found to be febrile and likely septic.  Pneumonia is noted on x-ray.  IV antibiotics been ordered. We will monitor her blood pressure for now, but will defer IV fluids for now. 2:11 AM Discussed with critical care, they will evaluate patient.  I am concerned the patient may eventually need intubation, therefore I consulted critical care also noted to be hypoglycemic, given glucose 2:53 AM Discussed with critical care, they reviewed case, at this point since patient is stabilized, will place her in stepdown unit We will consult hospitalist 3:20 AM Discussed with Dr. Tamala Julian for admission.  Patient is continue to receive IV fluids. Final Clinical Impressions(s) / ED Diagnoses   Final diagnoses:  Sepsis, due to unspecified organism ([place])  HCAP (healthcare-associated pneumonia)  Acute respiratory failure with hypoxia ([place])  Hypoglycemia    ED Discharge Orders    None       Ripley Fraise, MD December 24, 2017 [phone]

## 2018-01-05 NOTE — ED Triage Notes (Signed)
Patient BIB [facility] for SOB x 2 days. EMS states patient was very anxious, gasping for breath in tripod position upon arrival. Reports 64% O2 sats on nonrebreather, changed over to Bi-pap O2 sats improved to 85%. Patient goes to dialysis M-W-F. Denies any recent illness.

## 2018-01-05 NOTE — Consult Note (Signed)
[facility] Renal Consultation Note    Indication for Consultation:  Management of ESRD/hemodialysis; anemia, hypertension/volume and secondary hyperparathyroidism Referring physician, Fuller Plan, MD  HPI: [name] is a 42 y.o. 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  She has a mature AVGG ready to use since 1/18 per Dr. Bridgett Larsson but she has been resistant. Per nursing record it was used once and they she declined further use and has been using a TDC.  She had a fever of 100.8 at dialysis 3/8 with cold symptoms.  BC were drawn and she was encouraged to f/u with her PCP.  She did not have fevers at her treatments 3/11 and 3/12.  She has only run between 2.25 and 3.25 hours her last 3 treatments (4 hr = usual time.)  Pt was brought today to the ED by EMS due to respiratory distress.  Initial pulse ox was 64% on NRB. Sats improved to 85% on CPAP. Evaluation in ED showed labs remarkable for WBC71.9 rechecked>  66 K procalcinotnin > 150 lactic acid 1.9 Trop 0.09 > 0.12 K 4 Na 132 hgb 9.7 plts 272  CXR showed right basilar PNA. Blood cultures were drawn - she was MRSA PCR negative 08/2017. Empiric Vanc and Cefepime were started. She required an amp of D50 for hypoglycemia.  Past Medical History:  Diagnosis Date  . Anemia   . ARF (acute respiratory failure) ([place]) 04/11/2015   with hypoxia  . CAP (community acquired pneumonia) 2010  . Diabetic retinopathy ([place])   . ESRD on hemodialysis ([place]) 08/25/2013   Started HD in 2008.  Had [place] transplant in 2010 at [facility]  Went back on HD in 2012.  Pancreas was removed surgically at [facility] for rejection w abcess formation.  The  kidney was left in.  She now gets HD at [facility] on TTS schedule.     [name] History of blood transfusion    "lots of times" (01/27/2013)  . 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  Both organs have since rejected, see "ESRD" for details.   [name] HTN (hypertension)    not well controlled  . On home oxygen therapy    "5L; nightttime" (12/18/2015), no longer on O2 at bedtime as of 08/18/17  . Ovarian tumor, malignant ([place]) 2005   resected in [place]' pt denies that this was cancer on 01/27/2013  . TB (pulmonary tuberculosis) 2003, 2007   history of miliary TB partially treated in 2003, and then completely treated in 2007  . Type II diabetes mellitus ([place])    Past Surgical History:  Procedure Laterality Date  . AV FISTULA PLACEMENT  06/03/2012   Procedure: INSERTION OF ARTERIOVENOUS (AV) GORE-TEX GRAFT ARM;  Surgeon: Rosetta Posner, MD;  Location: [facility] OR;  Service: Vascular;  Laterality: Left;  . AV FISTULA PLACEMENT Left 09/18/2017   Procedure: INSERTION OF ARTERIOVENOUS (AV) GORE-TEX GRAFT LEFT UPPER ARM;  Surgeon: Conrad Mariaville Lake, MD;  Location: [place];  Service: Vascular;  Laterality: Left;  . BASCILIC VEIN TRANSPOSITION Right 03/06/2016   Procedure: RIGHT ARM BASILIC VEIN TRANSPOSITION;  Surgeon: Mal Misty, MD;  Location: [place];  Service: Vascular;  Laterality: Right;  . COMBINED KIDNEY-PANCREAS TRANSPLANT  2010   Archie Endo 01/27/2013; @ [place] Right    "cause of diabetes" (4/23/20140  . INSERTION OF DIALYSIS CATHETER Left 08/19/2017   Procedure: INSERTION OFTUNNELED  DIALYSIS CATHETER;  Surgeon: Conrad King Cove, MD;  Location: [place];  Service: Vascular;  Laterality: Left;  . PANCREATECTOMY  12/13/2010   for acute and chronic pancreatitis with abcess formation/notes 01/27/2013  . RENAL BIOPSY  12/13/2010; 10/25/2011   Archie Endo 01/27/2013  . REVISON OF ARTERIOVENOUS FISTULA Right 08/19/2017   Procedure: LIGATion RIGHT BASILIC VEIN  TRANSPOSITION;  Surgeon: Conrad Moultrie, MD;  Location: [place];  Service: Vascular;  Laterality: Right;  . TEE WITHOUT CARDIOVERSION N/A 04/22/2014   Procedure: TRANSESOPHAGEAL ECHOCARDIOGRAM (TEE);  Surgeon: Fay Records, MD;  Location: [facility] ENDOSCOPY;  Service: Cardiovascular;  Laterality: N/AHinton D[place]  . TUMOR REMOVAL  2005   Ovarian, resected in [place]  . VENOGRAM Left 08/19/2017   Procedure: Left arm and Central VENOGRAM;  Surgeon: Conrad Dover, MD;  Location: [facility] OR;  Service: Vascular;  Laterality: Left;   Family History  Problem Relation Age of Onset  . Diabetes Mother   . Liver cancer Father   . Cancer Father    Social History:  reports that  has never smoked. she has never used smokeless tobacco. She reports that she does not drink alcohol or use drugs. Allergies  Allergen Reactions  . Ace Inhibitors Cough  . Other Rash    DERMABOND    Prior to Admission medications   Medication Sig Start Date End Date Taking? Authorizing Provider  amLODipine (NORVASC) 10 MG tablet Take 10 mg by mouth at bedtime.     [provider]  aspirin EC 81 MG tablet Take 81 mg by mouth at bedtime.     [provider]  B Complex-C-Folic Acid (RENA-VITE PO) Take 1 tablet by mouth at bedtime.     [provider]  calcium acetate (PHOSLO) 667 MG capsule Take 3 capsules (2,001 mg total) by mouth 3 (three) times daily with meals. 01/28/13   Alric Seton, PA-C  cloNIDine (CATAPRES) 0.1 MG tablet Take 0.1 mg by mouth 2 (two) times daily.     [provider]  diphenhydrAMINE (BENADRYL) 25 mg capsule Take 25 mg by mouth daily as needed for allergies.    [provider]  escitalopram (LEXAPRO) 20 MG tablet Take 10 mg by mouth daily.     [provider]  insulin aspart (NOVOLOG) 100 UNIT/ML injection Inject 16 Units into the skin 2 (two) times daily with breakfast and lunch.     [provider]  insulin glargine (LANTUS) 100 UNIT/ML injection Inject 0.5  mLs (50 Units total) into the skin at bedtime. Patient taking differently: Inject 32 Units into the skin at bedtime.  04/23/14   Delfina Redwood, MD  labetalol (NORMODYNE) 300 MG tablet Take 300 mg by mouth 2 (two) times daily.    [provider]  oxyCODONE-acetaminophen (ROXICET) 5-325 MG tablet Take 1 tablet by mouth every 4 (four) hours as needed for moderate pain. 09/18/17   Ulyses Amor, PA-C  sucroferric oxyhydroxide (VELPHORO) 500 MG chewable tablet Chew 1,000 mg by mouth 3 (three) times daily with meals.    [provider]   Current Facility-Administered Medications  Medication Dose Route Frequency Provider Last Rate Last Dose  . acetaminophen (TYLENOL) tablet 650 mg  650 mg Oral Q6H PRN Fuller Plan  A, MD       Or  . acetaminophen (TYLENOL) suppository 650 mg  650 mg Rectal Q6H PRN Smith, Rondell A, MD      . ceFEPIme (MAXIPIME) 2 g in sodium chloride 0.9 % 100 mL IVPB  2 g Intravenous Q M,W,F-2000 Erenest Blank, RPH      . Darbepoetin Alfa (ARANESP) injection 100 mcg  100 mcg Intravenous Q Wed-HD Alric Seton, PA-C      . doxercalciferol (HECTOROL) injection 1 mcg  1 mcg Intravenous Q M,W,F-HD Alric Seton, PA-C      . heparin injection 5,000 Units  5,000 Units Subcutaneous Q8H Fuller Plan A, MD   5,000 Units at 01/10/2018 [phone]  . ipratropium-albuterol (DUONEB) 0.5-2.5 (3) MG/3ML nebulizer solution 3 mL  3 mL Nebulization Q2H PRN Smith, Rondell A, MD      . ipratropium-albuterol (DUONEB) 0.5-2.5 (3) MG/3ML nebulizer solution 3 mL  3 mL Nebulization Q6H Allie Bossier, MD   3 mL at 01-10-18 0805  . ondansetron (ZOFRAN) tablet 4 mg  4 mg Oral Q6H PRN Fuller Plan A, MD       Or  . ondansetron (ZOFRAN) injection 4 mg  4 mg Intravenous Q6H PRN Smith, Rondell A, MD      . oseltamivir (TAMIFLU) capsule 30 mg  30 mg Oral Q M,W,F-2000 Norval Morton, MD   Stopped at 01-10-2018 [phone]  . vancomycin (VANCOCIN) IVPB 750 mg/150 ml premix  750 mg Intravenous Q  M,W,F-HD Erenest Blank, [facility]       Current Outpatient Medications  Medication Sig Dispense Refill  . amLODipine (NORVASC) 10 MG tablet Take 10 mg by mouth at bedtime.     [name] aspirin EC 81 MG tablet Take 81 mg by mouth at bedtime.     . B Complex-C-Folic Acid (RENA-VITE PO) Take 1 tablet by mouth at bedtime.     . calcium acetate (PHOSLO) 667 MG capsule Take 3 capsules (2,001 mg total) by mouth 3 (three) times daily with meals.    . cloNIDine (CATAPRES) 0.1 MG tablet Take 0.1 mg by mouth 2 (two) times daily.     . diphenhydrAMINE (BENADRYL) 25 mg capsule Take 25 mg by mouth daily as needed for allergies.    [name] escitalopram (LEXAPRO) 20 MG tablet Take 10 mg by mouth daily.     . insulin aspart (NOVOLOG) 100 UNIT/ML injection Inject 16 Units into the skin 2 (two) times daily with breakfast and lunch.     . insulin glargine (LANTUS) 100 UNIT/ML injection Inject 0.5 mLs (50 Units total) into the skin at bedtime. (Patient taking differently: Inject 32 Units into the skin at bedtime. ) 10 mL 11  . labetalol (NORMODYNE) 300 MG tablet Take 300 mg by mouth 2 (two) times daily.    [name] oxyCODONE-acetaminophen (ROXICET) 5-325 MG tablet Take 1 tablet by mouth every 4 (four) hours as needed for moderate pain. 15 tablet 0  . sucroferric oxyhydroxide (VELPHORO) 500 MG chewable tablet Chew 1,000 mg by mouth 3 (three) times daily with meals.     Labs: Basic Metabolic Panel: Recent Labs  Lab 01-10-18 0100 2018-01-10 0107 January 10, 2018 0443  NA 131* 128* 132*  K 4.1 4.0 4.0  CL 87* 92* 95*  CO2 9*  --  9*  GLUCOSE 61* 63* 88  BUN 79* 69* 79*  CREATININE 7.98* 7.70* 7.76*  CALCIUM 8.9  --  8.2*  CBC: Recent Labs  Lab 2018-01-10 0100 01/10/2018 0107 01-10-2018 0443  WBC 71.9*  --  66.2*  NEUTROABS 64.7*  --   --   HGB 12.0 13.6 9.7*  HCT 35.3* 40.0 28.5*  MCV 95.9  --  95.6  PLT 342  --  272   Cardiac Enzymes: Recent Labs  Lab Jan 09, 2018 0100 Jan 09, 2018 0443  TROPONINI 0.09* 0.12*   CBG: Recent Labs  Lab  2018-01-09 0202 01/09/2018 0228 01-09-18 0356 01-09-2018 0427  GLUCAP 38* 131* 98 83   Iron Studies: No results for input(s): IRON, TIBC, TRANSFERRIN, FERRITIN in the last 72 hours. Studies/Results: Dg Chest Port 1 View  Result Date: 2018-01-09 CLINICAL DATA:  Acute onset of difficulty breathing. Decreased O2 saturation. EXAM: PORTABLE CHEST 1 VIEW COMPARISON:  Chest radiograph performed 12/17/2015 FINDINGS: Right basilar airspace opacity is concerning for pneumonia. No pleural effusion or pneumothorax is seen. The cardiomediastinal silhouette is borderline normal in size. A left-sided dual-lumen catheter is noted ending at the right atrium. No acute osseous abnormalities are identified. IMPRESSION: Right basilar airspace opacity is concerning for pneumonia. Followup PA and lateral chest X-ray is recommended in 3-4 weeks following trial of antibiotic therapy to ensure resolution and exclude underlying malignancy. Electronically Signed   By: Garald Balding M.D.   On: 2018/01/09 01:27    ROS: As per HPI . Limited due to BiPAP. Denies pain anywhere including fingers. + SOB. Denies cough, diarrhea, N, V (though records indicate vomited this am), Does not make urine.  Husband said she had onset about 3 days ago with myalgias.    Physical Exam: Vitals:   2018/01/09 0615 2018/01/09 0630 09-Jan-2018 0715 January 09, 2018 0815  BP: (!) 155/66 (!) 105/57 102/60 113/62  Pulse: (!) 124 (!) 109 (!) 106 99  Resp: (!) 24 (!) 28 (!) 21 (!) 22  Temp:      TempSrc:      SpO2: 100% 100% 100% 100%     General:  Chronically ill appearing [place] female on BiPAP Head: NCAT sclera not icteric MMM, facial edema Neck: Supple.  Lungs: soft rhonchi on right chest anteriorly /crackles Increased WOB tachypneic Heart: tachy reg - difficult to hear due to breath sounds.  Abdomen: soft NT + BS Lower extremities: tr edema or ischemic changes, no open wounds  Upper extremities: multiple  Chronic ischemicdistal  finger wounds - right 2,3   4h finger - 3rd finger erythematous, small amount of purulent drainge- fingers 2 and 4 dry; left thumb, 2 and 4th finger ischemic distal dry wounds Neuro: [name] Moves all extremities spontaneously. Dialysis Access: left upper AVGG + bruit without evidence of infection, nontender/no drainage, left IJ TDC - catheter exit no drainage, no erythema, nontender  Dialysis Orders: [facility] MWF 4 hr EDW 67.5 2 K 2.5 Ca heparin 3500 hectorol 1, Mircera 60 q 2 weeks - (last got 75 2/18) Recent labs: hgb 10.3 3/6 declining tsat 35% feb Ca 9's P 10.2 iPTH 416  Access: left IJ TDC and left upper AVGG  Usual BP meds; amlodipine 20 mmg non HD days, clonidine 0.1 at HS, labetolol 300 bid  Assessment/Plan: 1.  Acute respiratory failure/sepsis - volume and infection - on BiPAP. Had [facility] without abtx 3/8 - cultures pending; r/o flu, empiric Vanc, Cefepime, tamiflu, droplet recautions; admit to step down after HD 2.  ESRD -  MWF - urgent HD this am due to volume overload; need to use AVGG at some point and watch for possible steal sx - high risk for catheter sepsis; BC 3/8 no growth yet 3.  Hypertension/volume  -  UF as BP allows due to current low BP; holding BP meds 4.  Anemia  - hgb 9.7 - continue ESA 5.  Metabolic bone disease -noncompliant with binders (velphoro 3 ac tid)- resume when eating - continue hectorol 6.  Nutrition  - NPO for now 7.  Chronic finger wounds in various stages of healing - right 3rd finger does have erythema with purulent drainage 8. DM - with hypoglycemia due to sepsis- per primary.- also on reglan 5 mg QID due to gastroparesis 9. + trop - likely demand ischemia  Myriam Jacobson, PA-C [place] [phone] 2018/01/07, 8:57 AM

## 2018-01-05 NOTE — ED Notes (Signed)
Attempted to take pt off bipap, placed pt on 3L [place]. Pt had episode of emesis, also coughed up red/brown sputum. Pt was cleaned, gown was changed. Pt RR also jumped back up to upper 30's. Pt was placed back on bipap. EDP is aware

## 2018-01-05 NOTE — Progress Notes (Signed)
   2018/01/10 2000  Clinical Encounter Type  Visited With Family  Visit Type Death  Referral From Nurse  Spiritual Encounters  Spiritual Needs Emotional;Grief support   Pt. Expired and family was at bedside. Husband very emotional but has much support around him. Provided emotional support and presence to family. Family stated that they will use [employer] home in [place]. Matthew Folks

## 2018-01-05 NOTE — Progress Notes (Signed)
   01/01/2018 1300  Clinical Encounter Type  Visited With Patient  Visit Type Initial  Referral From Nurse  Spiritual Encounters  Spiritual Needs Emotional  Stress Factors  Patient Stress Factors Exhausted  Family Stress Factors None identified   This was a code blue pager for a 42 year old female caucasian Pt. Pt was taken from the ED and carried to [facility] 6thfloor hemodialysis. Patient was then taken for admission to 2 rm 11. Chaplain was sent by the [facility] to search for Pt's family from ED and 2C where the Pt intended to go but there was no family available. Chaplain brought back report to [facility] and followed up with Pt on 36M . No family members available till now but Chaplain talked to the nurse to call back when family appears.  Azzan Butler a Medical sales representative, [employer]

## 2018-01-05 NOTE — Progress Notes (Signed)
   Jan 10, 2018 1500  Clinical Encounter Type  Visited With Patient  Visit Type Follow-up  Referral From Nurse  Consult/Referral To Chaplain  Spiritual Encounters  Spiritual Needs Emotional  Stress Factors  Patient Stress Factors Exhausted  Family Stress Factors Exhausted    This was a code blue page for a 42 year old female who had coded earlier. Large group of staff on-site helping Pt. No family on-site but charge nurse said that they had been around earlier. Chaplain went looking for family in waiting room and hall-way but found none. Later chaplain reported to the staff that the family was unavailable. Chaplain provided emotional support trough compassionate presence.  [name] a Medical sales representative, [employer]

## 2018-01-05 NOTE — Procedures (Signed)
ABG results called to Dr. Nelda Marseille.  Vent settings changed per MD order, will follow up with ABG X42mins.

## 2018-01-05 NOTE — Progress Notes (Signed)
Patient had another cardiac arrest.  Likely driven by acidosis.  Responded quickly.  Attempted to find family on more than one occasion at this point.  Spoke with chaplain she will attempt to find family as well.  No response to phone calls.  Remains full code for now.  Spoke with nephrology.  Will start bicarb drip, place TLC and recheck CBG.  The patient is critically ill with multiple organ systems failure and requires high complexity decision making for assessment and support, frequent evaluation and titration of therapies, application of advanced monitoring technologies and extensive interpretation of multiple databases.   Critical Care Time devoted to patient care services described in this note is  35  Minutes. This time reflects time of care of this signee Dr [name]. This critical care time does not reflect procedure time, or teaching time or supervisory time of PA/NP/Med student/Med Resident etc but could involve care discussion time.  Rush Farmer, M.D. [facility] Pulmonary/Critical Care Medicine. Pager: [phone]. After hours pager: [phone].

## 2018-01-05 NOTE — Progress Notes (Signed)
RT NOTE:  ISTAT ABG results reported to Dr. Christy Gentles

## 2018-01-05 NOTE — H&P (Signed)
History and Physical    [name] NID:[id number] DOB: [birth year] DOA: 12/27/17  Referring MD/NP/PA: Dr. Ripley Fraise PCP: Tommy Medal, MD  Patient coming from: Via EMS  Chief Complaint: Shortness of breath  I have personally briefly reviewed patient's old medical records in [place]   HPI: [name] is a 42 y.o. female with medical history significant of HTN, ESRD on HD(M/W/F), DM type II, s/p kidney/pancreas failed transplant in 2010, anemia of chronic disease, and history of TB; who presents with complaints of 3 days of progressively worsening shortness of breath.  History is somewhat limited due to patient's respiratory distress and being currently on BiPAP.  Associated symptoms include complaints of  fever, chills, body aches, and generalized malaise.  Denies any nausea or vomiting symptoms.  She appears to report possible sick contacts with a person with the flu.  No note of any recent missed dialysis sessions.    En route with EMS patient was noted to be gasping for air with O2 saturations as low 64% on nonrebreather.  She was transitioned to CPAP mask with O2 sats improved to 85%.  ED Course: Upon admission into the emergency room patient was noted to be febrile up to 100.5 F, pulse 113-141, respirations 20-35, blood pressure 100/55 total 168/126.  Subsequently discontinued.  O2 saturations as low as 84% and improved to 100% on BiPAP.  Initial ABG noted pH of 7.246 with PCO2 of 18.2 and PO2 152 on BiPAP.  Labs revealed WBC 71.9, glucose 63, troponin 0.09, BNP >4500,  and lactic acid 3.71.  Chest x-ray showed signs of a right lobe pneumonia.  Patient was started on empiric antibiotics of vancomycin and cefepime and given 2.25 L of normal saline IV fluids.  Symptoms were initially thought to be due to hypertension for which nitroglycerin drip was initially started, but subsequently discontinued.  Patient also received an amp of D50 for hypoglycemia.  Review of Systems  Unable  to perform ROS: Critical illness  Constitutional: Positive for fever and malaise/fatigue.  HENT: Negative for congestion and ear discharge.   Respiratory: Positive for shortness of breath.   Cardiovascular: Negative for chest pain and leg swelling.  Gastrointestinal: Negative for nausea and vomiting.  Musculoskeletal: Positive for myalgias.  Skin: Negative for itching.    Past Medical History:  Diagnosis Date  . Anemia   . ARF (acute respiratory failure) ([place]) 04/11/2015   with hypoxia  . CAP (community acquired pneumonia) 2010  . Diabetic retinopathy ([place])   . ESRD on hemodialysis ([place]) 08/25/2013   Started HD in 2008.  Had [place] transplant in 2010 at [facility].  Went back on HD in 2012.  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  The kidney was left in.  She now gets [facility] on TTS schedule.     [name] History of blood transfusion    "lots of times" (01/27/2013)  . 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  Both organs have since rejected, see "ESRD" for details.   [name] HTN (hypertension)    not well controlled  . On home oxygen therapy    "5L; nightttime" (12/18/2015), no longer on O2 at bedtime as of 08/18/17  . Ovarian tumor, malignant ([place]) 2005   resected in [place]' pt denies that this was cancer on 01/27/2013  . TB (pulmonary tuberculosis) 2003, 2007   history of miliary TB partially treated in 2003,  and then completely treated in 2007  . Type II diabetes mellitus ([place])     Past Surgical History:  Procedure Laterality Date  . AV FISTULA PLACEMENT  06/03/2012   Procedure: INSERTION OF ARTERIOVENOUS (AV) GORE-TEX GRAFT ARM;  Surgeon: Rosetta Posner, MD;  Location: [facility];  Service: Vascular;  Laterality: Left;  . AV FISTULA PLACEMENT Left 09/18/2017   Procedure: INSERTION OF ARTERIOVENOUS (AV) GORE-TEX GRAFT LEFT UPPER ARM;  Surgeon: Conrad Olmsted, MD;  Location: [place];  Service: Vascular;  Laterality: Left;    . BASCILIC VEIN TRANSPOSITION Right 03/06/2016   Procedure: RIGHT ARM BASILIC VEIN TRANSPOSITION;  Surgeon: Mal Misty, MD;  Location: [employer];  Service: Vascular;  Laterality: Right;  . COMBINED KIDNEY-PANCREAS TRANSPLANT  2010   Archie Endo 01/27/2013; @ [place] Right    "cause of diabetes" (4/23/20140  . INSERTION OF DIALYSIS CATHETER Left 08/19/2017   Procedure: INSERTION OFTUNNELED  DIALYSIS CATHETER;  Surgeon: Conrad [place], MD;  Location: [place];  Service: Vascular;  Laterality: Left;  . PANCREATECTOMY  12/13/2010   for acute and chronic pancreatitis with abcess formation/notes 01/27/2013  . RENAL BIOPSY  12/13/2010; 10/25/2011   Archie Endo 01/27/2013  . REVISON OF ARTERIOVENOUS FISTULA Right 08/19/2017   Procedure: LIGATion RIGHT BASILIC VEIN TRANSPOSITION;  Surgeon: Conrad [place], MD;  Location: [place];  Service: Vascular;  Laterality: Right;  . TEE WITHOUT CARDIOVERSION N/A 04/22/2014   Procedure: TRANSESOPHAGEAL ECHOCARDIOGRAM (TEE);  Surgeon: Fay Records, MD;  Location: [facility] ENDOSCOPY;  Service: Cardiovascular;  Laterality: N/[place]  . TUMOR REMOVAL  2005   Ovarian, resected in [place]  . VENOGRAM Left 08/19/2017   Procedure: Left arm and Central VENOGRAM;  Surgeon: Conrad [place], MD;  Location: [employer];  Service: Vascular;  Laterality: Left;     reports that  has never smoked. she has never used smokeless tobacco. She reports that she does not drink alcohol or use drugs.  Allergies  Allergen Reactions  . Ace Inhibitors Cough  . Other Rash    DERMABOND     Family History  Problem Relation Age of Onset  . Diabetes Mother   . Liver cancer Father   . Cancer Father     Prior to Admission medications   Medication Sig Start Date End Date Taking? Authorizing Provider  amLODipine (NORVASC) 10 MG tablet Take 10 mg by mouth at bedtime.     [provider]  aspirin EC 81 MG tablet Take 81 mg by mouth at bedtime.     [provider]  B Complex-C-Folic Acid  (RENA-VITE PO) Take 1 tablet by mouth at bedtime.     [provider]  calcium acetate (PHOSLO) 667 MG capsule Take 3 capsules (2,001 mg total) by mouth 3 (three) times daily with meals. 01/28/13   Alric Seton, PA-C  cloNIDine (CATAPRES) 0.1 MG tablet Take 0.1 mg by mouth 2 (two) times daily.     [provider]  diphenhydrAMINE (BENADRYL) 25 mg capsule Take 25 mg by mouth daily as needed for allergies.    [provider]  escitalopram (LEXAPRO) 20 MG tablet Take 10 mg by mouth daily.     [provider]  insulin aspart (NOVOLOG) 100 UNIT/ML injection Inject 16 Units into the skin 2 (two) times daily with breakfast and lunch.     [provider]  insulin glargine (LANTUS) 100 UNIT/ML injection Inject 0.5 mLs (50 Units total) into the skin  at bedtime. Patient taking differently: Inject 32 Units into the skin at bedtime.  04/23/14   Delfina Redwood, MD  labetalol (NORMODYNE) 300 MG tablet Take 300 mg by mouth 2 (two) times daily.    [provider]  oxyCODONE-acetaminophen (ROXICET) 5-325 MG tablet Take 1 tablet by mouth every 4 (four) hours as needed for moderate pain. 09/18/17   Ulyses Amor, PA-C  sucroferric oxyhydroxide (VELPHORO) 500 MG chewable tablet Chew 1,000 mg by mouth 3 (three) times daily with meals.    [provider]    Physical Exam:  Constitutional: Middle-aged female who appears to be in moderate respiratory distress and acutely ill appearing. Vitals:   01-16-18 0215 01-16-2018 0229 01/16/2018 0230 01-16-18 0248  BP: (!) 117/59  (!) 100/55 (!) 102/56  Pulse: (!) 116  (!) 117 (!) 115  Resp: 20  (!) 30 (!) 29  Temp:  99.2 F (37.3 C)    TempSrc:  Rectal    SpO2: 100%  100% 100%   Eyes: PERRL, lids and conjunctivae normal ENMT: Mucous membranes are dry posterior pharynx clear of any exudate or lesions.  Neck: normal, supple, no masses, no thyromegaly Respiratory: Tachypneic with crackles noted no  significant wheezes.  Patient only able to talk in short 1-2 word sentences. Cardiovascular: Tachycardic, no murmurs / rubs / gallops. No extremity edema. 2+ pedal pulses. No carotid bruits.  Abdomen: no tenderness, no masses palpated. No hepatosplenomegaly. Bowel sounds positive.  Musculoskeletal: no clubbing / cyanosis. No joint deformity upper and lower extremities. Good ROM, no contractures. Normal muscle tone.  Skin: no rashes, lesions, ulcers. No induration Neurologic: CN 2-12 grossly intact.  And able to move all 4 extremities. Psychiatric: Normal judgment and insight. Alert and oriented x 3.  Anxious mood.     Labs on Admission: I have personally reviewed following labs and imaging studies  CBC: Recent Labs  Lab 01/16/18 0100 2018/01/16 0107  WBC 71.9*  --   NEUTROABS 64.7*  --   HGB 12.0 13.6  HCT 35.3* 40.0  MCV 95.9  --   PLT 342  --    Basic Metabolic Panel: Recent Labs  Lab January 16, 2018 0100 01-16-2018 0107  NA 131* 128*  K 4.1 4.0  CL 87* 92*  CO2 9*  --   GLUCOSE 61* 63*  BUN 79* 69*  CREATININE 7.98* 7.70*  CALCIUM 8.9  --    GFR: CrCl cannot be calculated (Unknown ideal weight.). Liver Function Tests: No results for input(s): AST, ALT, ALKPHOS, BILITOT, PROT, ALBUMIN in the last 168 hours. No results for input(s): LIPASE, AMYLASE in the last 168 hours. No results for input(s): AMMONIA in the last 168 hours. Coagulation Profile: Recent Labs  Lab 01-16-2018 0100  INR 1.17   Cardiac Enzymes: Recent Labs  Lab 01-16-18 0100  TROPONINI 0.09*   BNP (last 3 results) No results for input(s): PROBNP in the last 8760 hours. HbA1C: No results for input(s): HGBA1C in the last 72 hours. CBG: Recent Labs  Lab 16-Jan-2018 0202 01/16/18 0228  GLUCAP 38* 131*   Lipid Profile: No results for input(s): CHOL, HDL, LDLCALC, TRIG, CHOLHDL, LDLDIRECT in the last 72 hours. Thyroid Function Tests: No results for input(s): TSH, T4TOTAL, FREET4, T3FREE, THYROIDAB in the  last 72 hours. Anemia Panel: No results for input(s): VITAMINB12, FOLATE, FERRITIN, TIBC, IRON, RETICCTPCT in the last 72 hours. Urine analysis: No results found for: COLORURINE, APPEARANCEUR, LABSPEC, PHURINE, GLUCOSEU, HGBUR, BILIRUBINUR, KETONESUR, PROTEINUR, UROBILINOGEN, NITRITE, LEUKOCYTESUR Sepsis Labs: No  results found for this or any previous visit (from the past 240 hour(s)).   Radiological Exams on Admission: Dg Chest Port 1 View  Result Date: 01-11-18 CLINICAL DATA:  Acute onset of difficulty breathing. Decreased O2 saturation. EXAM: PORTABLE CHEST 1 VIEW COMPARISON:  Chest radiograph performed 12/17/2015 FINDINGS: Right basilar airspace opacity is concerning for pneumonia. No pleural effusion or pneumothorax is seen. The cardiomediastinal silhouette is borderline normal in size. A left-sided dual-lumen catheter is noted ending at the right atrium. No acute osseous abnormalities are identified. IMPRESSION: Right basilar airspace opacity is concerning for pneumonia. Followup PA and lateral chest X-ray is recommended in 3-4 weeks following trial of antibiotic therapy to ensure resolution and exclude underlying malignancy. Electronically Signed   By: Garald Balding M.D.   On: 01/11/18 01:27    EKG: Independently reviewed.  Sinus tachycardia 134 bpm with nonspecific changes  Assessment/Plan Respiratory failure with hypoxia, sepsis secondary to pneumonia: Acute.  Patient presents with shortness of breath requiring BiPAP.  Reports having exposure to the flu and influenza screen still pending. - Admit to stepdown - Continuous pulse oximetry - BiPAP PRN - Follow-up blood and sputum culture - Follow-up influenza screen - Give Tamiflu per hemodialysis dose - Check pro-calcitonin - Continue empiric antibiotics vancomycin and cefepime - DuoNeb's 4 times daily and as needed shortness of breath/wheezing - Trend lactic acid level - Reconsult PCCM if respiratory status  worsens  Hypoglycemia: Acute.  Blood sugars reported as low as the 30s.  Patient given dextrose with improvement. - Hypoglycemic protocol - CBGs every 6 hours for now - Hold insulin while n.p.o.   Elevated troponin: Acute.  Initial troponin elevated 0.09 on admission.  Suspect secondary to demand. - Trend cardiac troponins  ESRD on HD - Message sent for nephrology to eval in a.m. for need of hemodialysis  Hyponatremia: Chronic.  Sodium 131. - Continue to monitor  Essential hypertension - Restart home medications when able  DVT prophylaxis: heparin   Code Status:  Full Family Communication:  No family present at bedside Disposition Plan: TBD  Consults called: none Admission status: inpatient  Norval Morton MD [facility] Pager [phone]   If 7PM-7AM, please contact night-coverage [email or web address] Password [facility]  Jan 11, 2018, 3:13 AM

## 2018-01-05 NOTE — Congregational Nurse Program (Unsigned)
Congregational Nurse Program Note  Date of Encounter: 01-13-18  Past Medical History: Past Medical History:  Diagnosis Date  . Anemia   . ARF (acute respiratory failure) ([place]) 04/11/2015   with hypoxia  . CAP (community acquired pneumonia) 2010  . Diabetic retinopathy ([place])   . ESRD on hemodialysis ([place]) 08/25/2013   Started HD in 2008.  Had [place] transplant in 2010 at [facility].  Went back on HD in 2012.  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  The kidney was left in.  She now gets HD at [facility] on TTS schedule.     [name] History of blood transfusion    "lots of times" (01/27/2013)  . 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  Both organs have since rejected, see "ESRD" for details.   [name] HTN (hypertension)    not well controlled  . On home oxygen therapy    "5L; nightttime" (12/18/2015), no longer on O2 at bedtime as of 08/18/17  . Ovarian tumor, malignant ([place]) 2005   resected in [place]' pt denies that this was cancer on 01/27/2013  . TB (pulmonary tuberculosis) 2003, 2007   history of miliary TB partially treated in 2003, and then completely treated in 2007  . Type II diabetes mellitus ([place])     Encounter Details: CNP Questionnaire - 2018/01/13 1900      Questionnaire   Patient Status  Immigrant    Race  Asian    Location Patient Served At  Not Applicable [place] [address]   [place]  No food insecurities    Housing/Utilities  Yes, have permanent housing    Transportation  No transportation needs    Interpersonal Safety  Yes, feel physically and emotionally safe where you currently live    Medication  No medication insecurities    Medical Provider  Yes    Referrals  Not Applicable    ED Visit Averted  Not Applicable    Life-Saving Intervention Made  Not Applicable       Reported serious condition by pastor, visited [facility] hospital, family @ bedside, husband and other family were  shocked that she passed away around 6:50 pm Spiritual care done. Mable Fill was with family. Funeral home arranged.

## 2018-01-05 NOTE — ED Notes (Signed)
CBG back down to 60, verbal order from EDP for 1 amp D50.

## 2018-01-05 NOTE — Procedures (Signed)
Arterial Catheter Insertion Procedure Note [name] [id number] 08/21/1976  Procedure: Insertion of Arterial Catheter  Indications: Blood pressure monitoring and Frequent blood sampling  Procedure Details Consent: Unable to obtain consent because of emergent medical necessity. Time Out: Verified patient identification, verified procedure, site/side was marked, verified correct patient position, special equipment/implants available, medications/allergies/relevent history reviewed, required imaging and test results available.  Performed  Maximum sterile technique was used including antiseptics, cap, gloves, gown, hand hygiene, mask and sheet. Skin prep: Chlorhexidine; local anesthetic administered 20 gauge catheter was inserted into left radial artery using the Seldinger technique.  Evaluation Blood flow good; BP tracing good. Complications: No apparent complications.   [name] December 26, 2017

## 2018-01-05 NOTE — ED Notes (Signed)
Glucose noted to be 63 on chem 8. CBG checked, 38. EDP aware, verbal order for 1 amp D50.

## 2018-01-05 NOTE — Progress Notes (Signed)
Pt expired at [place]. Marleah Beever RN and [employer] RN pronounced. Dr. Nelda Marseille was notified. Family at bedside.

## 2018-01-05 NOTE — Procedures (Signed)
I was present at this dialysis session. I have reviewed the session itself and made appropriate changes.   Filed Weights    Recent Labs  Lab 12-29-17 0443  NA 132*  K 4.0  CL 95*  CO2 9*  GLUCOSE 88  BUN 79*  CREATININE 7.76*  CALCIUM 8.2*    Recent Labs  Lab December 29, 2017 0100 12/29/17 0107 2017-12-29 0443  WBC 71.9*  --  66.2*  NEUTROABS 64.7*  --   --   HGB 12.0 13.6 9.7*  HCT 35.3* 40.0 28.5*  MCV 95.9  --  95.6  PLT 342  --  272    Scheduled Meds: . darbepoetin (ARANESP) injection - DIALYSIS  100 mcg Intravenous Q Wed-HD  . doxercalciferol  1 mcg Intravenous Q M,W,F-HD  . heparin  5,000 Units Subcutaneous Q8H  . ipratropium-albuterol  3 mL Nebulization Q6H  . oseltamivir  30 mg Oral Q M,W,F-2000   Continuous Infusions: . ceFEPime (MAXIPIME) IV    . vancomycin     PRN Meds:.acetaminophen **OR** acetaminophen, ipratropium-albuterol, ondansetron **OR** ondansetron (ZOFRAN) IV   Pearson Grippe  MD 12-29-17, 10:50 AM

## 2018-01-05 NOTE — Procedures (Signed)
Cardiac Arrest  PEA cardiac arrest likely due to acidosis.  Patient given epi and bicarb with ROSC within 1 min.  Please see code sheet for details.  Rush Farmer, M.D. [facility] Pulmonary/Critical Care Medicine. Pager: [phone]. After hours pager: [phone].

## 2018-01-05 NOTE — Code Documentation (Signed)
CODE BLUE NOTE  Patient Name: [name]   MRN: [id number]   Date of Birth/ Sex: 22-Oct-1975 , female      Admission Date: Jan 12, 2018  Attending Provider: Allie Bossier, MD  Primary Diagnosis: Acute respiratory failure [facility])    Indication: Pt was in her usual state of health until this PM, when she was noted to be not breathing and without pulse. Code blue was subsequently called. At the time of arrival on scene, ACLS protocol was underway.    Technical Description:  - CPR performance duration:  1 minute  - Was defibrillation or cardioversion used? No   - Was external pacer placed? No  - Was patient intubated pre/post CPR? Yes    Medications Administered: Y = Yes; Blank = No Amiodarone  n  Atropine  n  Calcium  n  Epinephrine  y  Lidocaine  n  Magnesium  n  Norepinephrine  n  Phenylephrine  n  Sodium bicarbonate  n  Vasopressin  n    Post CPR evaluation:  - Final Status - Was patient successfully resuscitated ? Yes - What is current rhythm? Sinus tach - What is current hemodynamic status? Hemodynamically stable   Miscellaneous Information:  - Labs sent, including: CBG's, LA, CMP  - Primary team notified?  Yes  - Family Notified? unknown  - Additional notes/ transfer status: Patient transerred from HD to [place], [place], DO  2018/01/12, 1:55 PM

## 2018-01-05 NOTE — Progress Notes (Signed)
Near end of HD pt developed progressive hypoxia and eventually respiratory code, intubated to ICU.  Is now hypotensive starting pressors and hypoglycemic.    ABG 7.2 / 35 / 227, HCO3 calc 15.  AG was up to start HD, lactate wasn't very elevated.  Seems pt with +flu, likley post flu PNA. WBC very elevated, ? CDI.    ABX per CCM.  For now will address acidosis with NaHCO3 gtt + D5W; likely to need CRRT tomorrow if not sooner.

## 2018-01-05 NOTE — Death Summary Note (Signed)
DEATH SUMMARY   Patient Details  Name: [name] MRN: [id number] DOB: [birth year]  Admission/Discharge Information   Admit Date:  2017/12/18  Date of Death: Date of Death: 2017/12/18  Time of Death: Time of Death: 1853  Length of Stay: 1  Referring Physician: Tommy Medal, MD   Reason(s) for Hospitalization    Diagnoses  Preliminary cause of death:   Refractory septic shock Secondary Diagnoses (including complications and co-morbidities):  Principal Problem:   Acute respiratory failure ([place]) Active Problems:   Essential hypertension   DM (diabetes mellitus) ([place])   ESRD on hemodialysis ([place])   Hyponatremia   Cardiac arrest, cause unspecified ([place])   Sepsis due to pneumonia [facility])   Community acquired pneumonia   Septic shock ([place])   Hypoglycemia   Refractory shock ([place])   Goals of care, counseling/discussion   Brief Hospital Course (including significant findings, care, treatment, and services provided and events leading to death)  [name] is a 42 y.o. year old female who The patient was brought in by EMS with reports of SOB x2 days.  She was found to have saturations in the 60's.  She had increased work of breathing and was placed on BiPAP.  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  The patient is reportedly faithful with HD (M,W,F) and actually comes in for extra treatments during the week to allow her to eat / drink more.  However, in the past she has left and gone back to [place] for extended periods of time.   She was stabilized in the ER and admitted per [facility].  Initial labs - 7.246 / 18.2 / 152 / 7.8, Na 132, K 4.0, cl 95, CO2 9, glucose 88, BUN 79, Sr Cr 7.76, Ca 8.2, troponin 0.12, lactic acid 2.07, PCT > 150, WBC 71.9, Hgb 12 and platelet 342.  PCXR showed RLL infiltrate, HD cath in place.    On 12/19/2022 the patient was transferred to HD before going to [facility] bed.  While in HD, she  became less responsive, had a change in color and bradycardia.  Patient was checked and unable to palpate pulse.  CPR / ACLS initiated.  She required 2 minutes of CPR with ROSC (1 epi).  The patient was intubated She reportedly received a full round of HD with approximately 2L removed.  The patient was transferred to ICU for further care.   Patient developed refractory shock while in the ICU, I had an extensive conversation with her husband and after discussion decision was not escalate care any further as patient was maxed on multiple pressors.  The patient never regained consciousness and expired shortly thereafter.     Pertinent Labs and Studies  Significant Diagnostic Studies Dg Chest Port 1 View  Result Date: 2017/12/18 CLINICAL DATA:  Intubation following cardiac arrest. EXAM: PORTABLE CHEST 1 VIEW COMPARISON:  Earlier same day FINDINGS: Endotracheal tube tip is 3 cm above the carina. Central line tips in the right atrium. Defibrillator is a in place. Mild edema persists. Persistent density in the lower lungs right more than left that could be aspiration. Some pleural fluid on the right. IMPRESSION: Endotracheal tube well positioned 3 cm above the carina. Other findings as before. Electronically Signed   By: Nelson Chimes M.D.   On: December 18, 2017 15:00   Dg Chest Port 1 View  Result Date: 12/18/2017 CLINICAL DATA:  Acute onset of difficulty  breathing. Decreased O2 saturation. EXAM: PORTABLE CHEST 1 VIEW COMPARISON:  Chest radiograph performed 12/17/2015 FINDINGS: Right basilar airspace opacity is concerning for pneumonia. No pleural effusion or pneumothorax is seen. The cardiomediastinal silhouette is borderline normal in size. A left-sided dual-lumen catheter is noted ending at the right atrium. No acute osseous abnormalities are identified. IMPRESSION: Right basilar airspace opacity is concerning for pneumonia. Followup PA and lateral chest X-ray is recommended in 3-4 weeks following trial of  antibiotic therapy to ensure resolution and exclude underlying malignancy. Electronically Signed   By: Garald Balding M.D.   On: 2017-12-19 01:27   Dg Abd Portable 1v  Result Date: December 19, 2017 CLINICAL DATA:  Feeding tube placement. History of acute renal failure, hypertension, tuberculosis, ovarian tumor, renal and pancreatic transplant. EXAM: PORTABLE ABDOMEN - 1 VIEW COMPARISON:  CT 01/24/2014 FINDINGS: Calcified right renal transplant noted in the right lower quadrant. A gastric tube with tip and side-port are noted along the greater curvature of the stomach with the tip in the region of the gastric antrum. Left femoral central line catheter projects to the L5 level. Mild small bowel gaseous distention is seen in the left hemiabdomen. IMPRESSION: 1. Gastric tip and side-port are noted in the expected location of the stomach. 2. Left femoral central line catheter projects up to the L5 level. 3. Reniform shaped calcified structure in the right lower quadrant consistent with rejected right renal transplant. 4. Mild gaseous distention of small bowel in the left hemiabdomen. Question localized ileus, dysmotility or early changes of bowel obstruction. Electronically Signed   By: Ashley Royalty M.D.   On: 12-19-2017 17:05    Microbiology Recent Results (from the past 240 hour(s))  Blood Culture (routine x 2)     Status: Abnormal   Collection Time: 12-19-2017  1:00 AM  Result Value Ref Range Status   Specimen Description BLOOD RIGHT ANTECUBITAL  Final   Special Requests   Final    BOTTLES DRAWN AEROBIC AND ANAEROBIC Blood Culture adequate volume   Culture  Setup Time   Final    GRAM POSITIVE COCCI IN BOTH AEROBIC AND ANAEROBIC BOTTLES PATIENT DISCHARGED OR EXPIRED PATIENT DECEASED Performed at [facility], [address] [place], [place] [address]    Culture METHICILLIN RESISTANT STAPHYLOCOCCUS AUREUS (A)  Final   Report Status 12/20/2017 FINAL  Final   Organism ID, Bacteria METHICILLIN  RESISTANT STAPHYLOCOCCUS AUREUS  Final      Susceptibility   Methicillin resistant staphylococcus aureus - MIC*    CIPROFLOXACIN >=8 RESISTANT Resistant     ERYTHROMYCIN >=8 RESISTANT Resistant     GENTAMICIN <=0.5 SENSITIVE Sensitive     OXACILLIN >=4 RESISTANT Resistant     TETRACYCLINE <=1 SENSITIVE Sensitive     VANCOMYCIN <=0.5 SENSITIVE Sensitive     TRIMETH/SULFA <=10 SENSITIVE Sensitive     CLINDAMYCIN <=0.25 SENSITIVE Sensitive     RIFAMPIN <=0.5 SENSITIVE Sensitive     Inducible Clindamycin NEGATIVE Sensitive     * METHICILLIN RESISTANT STAPHYLOCOCCUS AUREUS  Blood Culture (routine x 2)     Status: Abnormal   Collection Time: 12-19-2017  1:16 AM  Result Value Ref Range Status   Specimen Description BLOOD RIGHT ARM  Final   Special Requests AEROBIC BOTTLE ONLY Blood Culture adequate volume  Final   Culture  Setup Time   Final    GRAM POSITIVE COCCI AEROBIC BOTTLE ONLY PATIENT DISCHARGED OR EXPIRED PATIENT DECEASED Performed at [facility], [address], [place], [place]  [address]    Culture METHICILLIN RESISTANT STAPHYLOCOCCUS AUREUS (A)  Final   Report Status 12/19/2017 FINAL  Final   Organism ID, Bacteria METHICILLIN RESISTANT STAPHYLOCOCCUS AUREUS  Final      Susceptibility   Methicillin resistant staphylococcus aureus - MIC*    CIPROFLOXACIN >=8 RESISTANT Resistant     ERYTHROMYCIN >=8 RESISTANT Resistant     GENTAMICIN <=0.5 SENSITIVE Sensitive     OXACILLIN >=4 RESISTANT Resistant     TETRACYCLINE <=1 SENSITIVE Sensitive     VANCOMYCIN <=0.5 SENSITIVE Sensitive     TRIMETH/SULFA <=10 SENSITIVE Sensitive     CLINDAMYCIN <=0.25 SENSITIVE Sensitive     RIFAMPIN <=0.5 SENSITIVE Sensitive     Inducible Clindamycin NEGATIVE Sensitive     * METHICILLIN RESISTANT STAPHYLOCOCCUS AUREUS  Blood Culture ID Panel (Reflexed)     Status: Abnormal   Collection Time: 01-05-2018  1:16 AM  Result Value Ref Range Status   Enterococcus species NOT DETECTED NOT DETECTED  Final   Listeria monocytogenes NOT DETECTED NOT DETECTED Final   Staphylococcus species DETECTED (A) NOT DETECTED Final    Comment: PATIENT DISCHARGED OR EXPIRED PATIENT DECEASED    Staphylococcus aureus DETECTED (A) NOT DETECTED Final    Comment: Methicillin (oxacillin)-resistant Staphylococcus aureus (MRSA). MRSA is predictably resistant to beta-lactam antibiotics (except ceftaroline). Preferred therapy is vancomycin unless clinically contraindicated. Patient requires contact precautions if  hospitalized. PATIENT DISCHARGED OR EXPIRED PATIENT DECEASED    Methicillin resistance DETECTED (A) NOT DETECTED Final    Comment: PATIENT DISCHARGED OR EXPIRED PATIENT DECEASED    Streptococcus species NOT DETECTED NOT DETECTED Final   Streptococcus agalactiae NOT DETECTED NOT DETECTED Final   Streptococcus pneumoniae NOT DETECTED NOT DETECTED Final   Streptococcus pyogenes NOT DETECTED NOT DETECTED Final   Acinetobacter baumannii NOT DETECTED NOT DETECTED Final   Enterobacteriaceae species NOT DETECTED NOT DETECTED Final   Enterobacter cloacae complex NOT DETECTED NOT DETECTED Final   Escherichia coli NOT DETECTED NOT DETECTED Final   Klebsiella oxytoca NOT DETECTED NOT DETECTED Final   Klebsiella pneumoniae NOT DETECTED NOT DETECTED Final   Proteus species NOT DETECTED NOT DETECTED Final   Serratia marcescens NOT DETECTED NOT DETECTED Final   Haemophilus influenzae NOT DETECTED NOT DETECTED Final   Neisseria meningitidis NOT DETECTED NOT DETECTED Final   Pseudomonas aeruginosa NOT DETECTED NOT DETECTED Final   Candida albicans NOT DETECTED NOT DETECTED Final   Candida glabrata NOT DETECTED NOT DETECTED Final   Candida krusei NOT DETECTED NOT DETECTED Final   Candida parapsilosis NOT DETECTED NOT DETECTED Final   Candida tropicalis NOT DETECTED NOT DETECTED Final    Comment: Performed at [facility], [address] [place], [place] [address]  MRSA PCR Screening     Status:  Abnormal   Collection Time: January 05, 2018  1:53 PM  Result Value Ref Range Status   MRSA by PCR POSITIVE (A) NEGATIVE Final    Comment:        The GeneXpert MRSA Assay (FDA approved for NASAL specimens only), is one component of a comprehensive MRSA colonization surveillance program. It is not intended to diagnose MRSA infection nor to guide or monitor treatment for MRSA infections. RESULT CALLED TO, READ BACK BY AND VERIFIED WITH: H.DADAMO,RN AT 1638 BY L.PITT Jan 05, 2018     Lab Basic Metabolic Panel: Recent Labs  Lab 05-Jan-2018 0100 Jan 05, 2018 0107 January 05, 2018 0443 Jan 05, 2018 1406  NA 131* 128* 132* 139  K 4.1 4.0 4.0 4.5  CL 87* 92* 95* 100*  CO2 9*  --  9* 15*  GLUCOSE 61* 63* 88 65  BUN 79* 69* 79* 21*  CREATININE 7.98* 7.70* 7.76* 3.18*  CALCIUM 8.9  --  8.2* 8.2*  MG  --   --   --  2.1  PHOS  --   --   --  7.8*   Liver Function Tests: Recent Labs  Lab 12-23-17 1406  AST 58*  ALT 22  ALKPHOS 323*  BILITOT 1.1  PROT 6.5  ALBUMIN 2.1*   No results for input(s): LIPASE, AMYLASE in the last 168 hours. No results for input(s): AMMONIA in the last 168 hours. CBC: Recent Labs  Lab 23-Dec-2017 0100 Dec 23, 2017 0107 12-23-17 0443 12-23-2017 1406  WBC 71.9*  --  66.2* 70.8*  NEUTROABS 64.7*  --   --   --   HGB 12.0 13.6 9.7* 9.3*  HCT 35.3* 40.0 28.5* 28.6*  MCV 95.9  --  95.6 96.3  PLT 342  --  272 315   Cardiac Enzymes: Recent Labs  Lab 12-23-17 0100 12-23-2017 0443 2017-12-23 1406 12-23-2017 1629  TROPONINI 0.09* 0.12* 0.58* 0.53*   Sepsis Labs: Recent Labs  Lab December 23, 2017 0100 2017/12/23 0120 12/23/17 0443 Dec 23, 2017 0449 2017-12-23 1406 12-23-17 1408  PROCALCITON  --   --  >150.00  --   --   --   WBC 71.9*  --  66.2*  --  70.8*  --   LATICACIDVEN  --  3.71* 1.9 2.07*  --  11.4*    Procedures/Operations     Vershawn Westrup 12/23/2017, 5:05 PM

## 2018-01-05 NOTE — Progress Notes (Signed)
Pt recvd from HD post arrest, intubated with 7.5@ 25 on vent, Dr. Nelda Marseille at bedside upon arrival, MD changed mode from [facility] to PCV rate 14 20/5 100%. Will cont to monitor.

## 2018-01-05 NOTE — Procedures (Signed)
Central Venous Catheter Insertion Procedure Note ROSEMAE MCQUOWN [id number] Feb 26, 1976  Procedure: Insertion of Central Venous Catheter Indications: Assessment of intravascular volume, Drug and/or fluid administration and Frequent blood sampling  Procedure Details Consent: Unable to obtain consent because of emergent medical necessity. Time Out: Verified patient identification, verified procedure, site/side was marked, verified correct patient position, special equipment/implants available, medications/allergies/relevent history reviewed, required imaging and test results available.  Performed  Maximum sterile technique was used including antiseptics, cap, gloves, gown, hand hygiene, mask and sheet. Skin prep: Chlorhexidine; local anesthetic administered A antimicrobial bonded/coated triple lumen catheter was placed in the left femoral vein due to patient being a dialysis patient using the Seldinger technique.  Evaluation Blood flow good Complications: No apparent complications Patient did tolerate procedure well. Chest X-ray ordered to verify placement.  CXR: pending.  YACOUB,WESAM 01-10-18, 3:26 PM

## 2018-01-05 DEATH — deceased
